# Patient Record
Sex: Female | Born: 1935 | Race: White | Hispanic: No | Smoking: Former smoker
Health system: Southern US, Community
[De-identification: ages and names within clinical notes are randomized; demographics above are authoritative.]

## PROBLEM LIST (undated history)

## (undated) DIAGNOSIS — I639 Cerebral infarction, unspecified: Secondary | ICD-10-CM

## (undated) DIAGNOSIS — I493 Ventricular premature depolarization: Secondary | ICD-10-CM

## (undated) DIAGNOSIS — C50919 Malignant neoplasm of unspecified site of unspecified female breast: Secondary | ICD-10-CM

## (undated) DIAGNOSIS — I5042 Chronic combined systolic (congestive) and diastolic (congestive) heart failure: Secondary | ICD-10-CM

## (undated) DIAGNOSIS — I10 Essential (primary) hypertension: Secondary | ICD-10-CM

## (undated) DIAGNOSIS — K219 Gastro-esophageal reflux disease without esophagitis: Secondary | ICD-10-CM

## (undated) DIAGNOSIS — I472 Ventricular tachycardia: Secondary | ICD-10-CM

## (undated) DIAGNOSIS — N3281 Overactive bladder: Secondary | ICD-10-CM

## (undated) DIAGNOSIS — I2510 Atherosclerotic heart disease of native coronary artery without angina pectoris: Secondary | ICD-10-CM

## (undated) DIAGNOSIS — M1990 Unspecified osteoarthritis, unspecified site: Secondary | ICD-10-CM

## (undated) DIAGNOSIS — E039 Hypothyroidism, unspecified: Secondary | ICD-10-CM

## (undated) DIAGNOSIS — I255 Ischemic cardiomyopathy: Secondary | ICD-10-CM

## (undated) DIAGNOSIS — E785 Hyperlipidemia, unspecified: Secondary | ICD-10-CM

## (undated) DIAGNOSIS — E119 Type 2 diabetes mellitus without complications: Secondary | ICD-10-CM

## (undated) DIAGNOSIS — M8580 Other specified disorders of bone density and structure, unspecified site: Secondary | ICD-10-CM

## (undated) DIAGNOSIS — I420 Dilated cardiomyopathy: Secondary | ICD-10-CM

## (undated) HISTORY — DX: Gastro-esophageal reflux disease without esophagitis: K21.9

## (undated) HISTORY — DX: Type 2 diabetes mellitus without complications: E11.9

## (undated) HISTORY — DX: Hyperlipidemia, unspecified: E78.5

## (undated) HISTORY — DX: Ventricular premature depolarization: I49.3

## (undated) HISTORY — DX: Other specified disorders of bone density and structure, unspecified site: M85.80

## (undated) HISTORY — DX: Essential (primary) hypertension: I10

## (undated) HISTORY — PX: CATARACT EXTRACTION W/ INTRAOCULAR LENS  IMPLANT, BILATERAL: SHX1307

## (undated) HISTORY — DX: Overactive bladder: N32.81

---
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
## 1966-03-12 DIAGNOSIS — I639 Cerebral infarction, unspecified: Secondary | ICD-10-CM

## 1966-03-12 HISTORY — DX: Cerebral infarction, unspecified: I63.9

## 1996-03-12 HISTORY — PX: BREAST LUMPECTOMY WITH AXILLARY LYMPH NODE DISSECTION: SHX5756

## 1996-03-12 HISTORY — PX: BREAST LUMPECTOMY: SHX2

## 1997-06-24 ENCOUNTER — Other Ambulatory Visit: Admission: RE | Admit: 1997-06-24 | Discharge: 1997-06-24 | Payer: Self-pay | Admitting: Hematology and Oncology

## 1997-06-30 ENCOUNTER — Other Ambulatory Visit: Admission: RE | Admit: 1997-06-30 | Discharge: 1997-06-30 | Payer: Self-pay | Admitting: Family Medicine

## 1997-07-01 ENCOUNTER — Other Ambulatory Visit: Admission: RE | Admit: 1997-07-01 | Discharge: 1997-07-01 | Payer: Self-pay | Admitting: Family Medicine

## 1998-07-11 ENCOUNTER — Other Ambulatory Visit: Admission: RE | Admit: 1998-07-11 | Discharge: 1998-07-11 | Payer: Self-pay | Admitting: Family Medicine

## 1999-06-12 ENCOUNTER — Encounter: Admission: RE | Admit: 1999-06-12 | Discharge: 1999-06-12 | Payer: Self-pay | Admitting: Family Medicine

## 1999-06-12 ENCOUNTER — Encounter: Payer: Self-pay | Admitting: Family Medicine

## 1999-07-17 ENCOUNTER — Other Ambulatory Visit: Admission: RE | Admit: 1999-07-17 | Discharge: 1999-07-17 | Payer: Self-pay | Admitting: Family Medicine

## 2000-05-23 ENCOUNTER — Ambulatory Visit (HOSPITAL_COMMUNITY): Admission: RE | Admit: 2000-05-23 | Discharge: 2000-05-23 | Payer: Self-pay | Admitting: Family Medicine

## 2000-05-23 ENCOUNTER — Encounter: Payer: Self-pay | Admitting: Family Medicine

## 2000-08-22 ENCOUNTER — Other Ambulatory Visit: Admission: RE | Admit: 2000-08-22 | Discharge: 2000-08-22 | Payer: Self-pay | Admitting: Family Medicine

## 2001-08-28 ENCOUNTER — Other Ambulatory Visit: Admission: RE | Admit: 2001-08-28 | Discharge: 2001-08-28 | Payer: Self-pay | Admitting: Family Medicine

## 2001-09-02 ENCOUNTER — Ambulatory Visit (HOSPITAL_COMMUNITY): Admission: RE | Admit: 2001-09-02 | Discharge: 2001-09-02 | Payer: Self-pay | Admitting: Oncology

## 2001-09-02 ENCOUNTER — Encounter: Payer: Self-pay | Admitting: Oncology

## 2002-08-31 ENCOUNTER — Other Ambulatory Visit: Admission: RE | Admit: 2002-08-31 | Discharge: 2002-08-31 | Payer: Self-pay | Admitting: Family Medicine

## 2003-03-13 HISTORY — PX: COLONOSCOPY: SHX174

## 2003-05-11 ENCOUNTER — Ambulatory Visit (HOSPITAL_COMMUNITY): Admission: RE | Admit: 2003-05-11 | Discharge: 2003-05-11 | Payer: Self-pay | Admitting: Oncology

## 2004-05-01 ENCOUNTER — Ambulatory Visit: Payer: Self-pay | Admitting: Oncology

## 2004-08-08 ENCOUNTER — Ambulatory Visit: Payer: Self-pay | Admitting: Oncology

## 2004-10-31 ENCOUNTER — Ambulatory Visit: Payer: Self-pay | Admitting: Oncology

## 2005-02-20 ENCOUNTER — Other Ambulatory Visit: Admission: RE | Admit: 2005-02-20 | Discharge: 2005-02-20 | Payer: Self-pay | Admitting: Family Medicine

## 2005-05-09 ENCOUNTER — Ambulatory Visit: Payer: Self-pay | Admitting: Oncology

## 2005-05-10 ENCOUNTER — Ambulatory Visit (HOSPITAL_COMMUNITY): Admission: RE | Admit: 2005-05-10 | Discharge: 2005-05-10 | Payer: Self-pay | Admitting: Oncology

## 2005-10-12 ENCOUNTER — Ambulatory Visit: Payer: Self-pay | Admitting: Oncology

## 2005-10-16 LAB — COMPREHENSIVE METABOLIC PANEL
Albumin: 4.4 g/dL (ref 3.5–5.2)
Alkaline Phosphatase: 92 U/L (ref 39–117)
BUN: 25 mg/dL — ABNORMAL HIGH (ref 6–23)
CO2: 28 mEq/L (ref 19–32)
Chloride: 101 mEq/L (ref 96–112)
Potassium: 4.5 mEq/L (ref 3.5–5.3)
Sodium: 138 mEq/L (ref 135–145)
Total Bilirubin: 0.5 mg/dL (ref 0.3–1.2)
Total Protein: 7 g/dL (ref 6.0–8.3)

## 2005-10-16 LAB — CBC WITH DIFFERENTIAL/PLATELET
EOS%: 3.2 % (ref 0.0–7.0)
Eosinophils Absolute: 0.2 10*3/uL (ref 0.0–0.5)
HGB: 15 g/dL (ref 11.6–15.9)
MCH: 31.6 pg (ref 26.0–34.0)
MCHC: 34.5 g/dL (ref 32.0–36.0)
MCV: 91.7 fL (ref 81.0–101.0)
MONO#: 0.6 10*3/uL (ref 0.1–0.9)
Platelets: 249 10*3/uL (ref 145–400)
RBC: 4.74 10*6/uL (ref 3.70–5.32)
RDW: 12.3 % (ref 11.3–14.5)

## 2006-09-04 ENCOUNTER — Encounter: Admission: RE | Admit: 2006-09-04 | Discharge: 2006-09-04 | Payer: Self-pay | Admitting: Family Medicine

## 2006-10-11 ENCOUNTER — Ambulatory Visit: Payer: Self-pay | Admitting: Oncology

## 2006-10-16 LAB — CBC WITH DIFFERENTIAL/PLATELET
BASO%: 0.4 % (ref 0.0–2.0)
Basophils Absolute: 0 10*3/uL (ref 0.0–0.1)
EOS%: 4.2 % (ref 0.0–7.0)
MCV: 90.6 fL (ref 81.0–101.0)
MONO#: 0.6 10*3/uL (ref 0.1–0.9)
NEUT%: 62.9 % (ref 39.6–76.8)
Platelets: 251 10*3/uL (ref 145–400)
RDW: 12.1 % (ref 11.3–14.5)

## 2006-10-16 LAB — CANCER ANTIGEN 27.29: CA 27.29: 34 U/mL (ref 0–39)

## 2006-10-16 LAB — COMPREHENSIVE METABOLIC PANEL
CO2: 26 mEq/L (ref 19–32)
Creatinine, Ser: 0.76 mg/dL (ref 0.40–1.20)
Potassium: 4.5 mEq/L (ref 3.5–5.3)
Total Protein: 7.2 g/dL (ref 6.0–8.3)

## 2007-10-24 ENCOUNTER — Ambulatory Visit: Payer: Self-pay | Admitting: Oncology

## 2007-10-28 LAB — COMPREHENSIVE METABOLIC PANEL
Albumin: 4.3 g/dL (ref 3.5–5.2)
Alkaline Phosphatase: 67 U/L (ref 39–117)
CO2: 24 mEq/L (ref 19–32)
Creatinine, Ser: 0.78 mg/dL (ref 0.40–1.20)
Sodium: 141 mEq/L (ref 135–145)

## 2007-10-28 LAB — CBC WITH DIFFERENTIAL/PLATELET
Basophils Absolute: 0 10*3/uL (ref 0.0–0.1)
EOS%: 5.2 % (ref 0.0–7.0)
Eosinophils Absolute: 0.4 10*3/uL (ref 0.0–0.5)
HGB: 14.6 g/dL (ref 11.6–15.9)
LYMPH%: 25.1 % (ref 14.0–48.0)
MCHC: 34.4 g/dL (ref 32.0–36.0)
MONO#: 0.6 10*3/uL (ref 0.1–0.9)
MONO%: 7.8 % (ref 0.0–13.0)
RBC: 4.56 10*6/uL (ref 3.70–5.32)
RDW: 12.2 % (ref 11.3–14.5)
lymph#: 1.8 10*3/uL (ref 0.9–3.3)

## 2008-06-16 ENCOUNTER — Encounter: Admission: RE | Admit: 2008-06-16 | Discharge: 2008-08-25 | Payer: Self-pay | Admitting: Internal Medicine

## 2010-03-04 ENCOUNTER — Emergency Department (HOSPITAL_COMMUNITY)
Admission: EM | Admit: 2010-03-04 | Discharge: 2010-03-04 | Payer: Self-pay | Source: Home / Self Care | Admitting: Emergency Medicine

## 2011-03-20 DIAGNOSIS — L570 Actinic keratosis: Secondary | ICD-10-CM | POA: Diagnosis not present

## 2011-04-12 DIAGNOSIS — I6330 Cerebral infarction due to thrombosis of unspecified cerebral artery: Secondary | ICD-10-CM | POA: Diagnosis not present

## 2011-04-12 DIAGNOSIS — M810 Age-related osteoporosis without current pathological fracture: Secondary | ICD-10-CM | POA: Diagnosis not present

## 2011-04-12 DIAGNOSIS — E1129 Type 2 diabetes mellitus with other diabetic kidney complication: Secondary | ICD-10-CM | POA: Diagnosis not present

## 2011-04-12 DIAGNOSIS — Z0000 Encounter for general adult medical examination without abnormal findings: Secondary | ICD-10-CM | POA: Diagnosis not present

## 2011-04-12 DIAGNOSIS — I10 Essential (primary) hypertension: Secondary | ICD-10-CM | POA: Diagnosis not present

## 2011-04-12 DIAGNOSIS — E119 Type 2 diabetes mellitus without complications: Secondary | ICD-10-CM | POA: Diagnosis not present

## 2011-04-18 DIAGNOSIS — E039 Hypothyroidism, unspecified: Secondary | ICD-10-CM | POA: Diagnosis not present

## 2011-04-18 DIAGNOSIS — E1165 Type 2 diabetes mellitus with hyperglycemia: Secondary | ICD-10-CM | POA: Diagnosis not present

## 2011-04-18 DIAGNOSIS — I6330 Cerebral infarction due to thrombosis of unspecified cerebral artery: Secondary | ICD-10-CM | POA: Diagnosis not present

## 2011-04-18 DIAGNOSIS — E785 Hyperlipidemia, unspecified: Secondary | ICD-10-CM | POA: Diagnosis not present

## 2011-04-18 DIAGNOSIS — I10 Essential (primary) hypertension: Secondary | ICD-10-CM | POA: Diagnosis not present

## 2011-04-18 DIAGNOSIS — E1129 Type 2 diabetes mellitus with other diabetic kidney complication: Secondary | ICD-10-CM | POA: Diagnosis not present

## 2011-05-17 DIAGNOSIS — H40059 Ocular hypertension, unspecified eye: Secondary | ICD-10-CM | POA: Diagnosis not present

## 2011-05-17 DIAGNOSIS — H0230 Blepharochalasis unspecified eye, unspecified eyelid: Secondary | ICD-10-CM | POA: Diagnosis not present

## 2011-05-17 DIAGNOSIS — Z961 Presence of intraocular lens: Secondary | ICD-10-CM | POA: Diagnosis not present

## 2011-05-17 DIAGNOSIS — H43819 Vitreous degeneration, unspecified eye: Secondary | ICD-10-CM | POA: Diagnosis not present

## 2011-07-09 DIAGNOSIS — E785 Hyperlipidemia, unspecified: Secondary | ICD-10-CM | POA: Diagnosis not present

## 2011-07-09 DIAGNOSIS — E1129 Type 2 diabetes mellitus with other diabetic kidney complication: Secondary | ICD-10-CM | POA: Diagnosis not present

## 2011-07-16 DIAGNOSIS — E1165 Type 2 diabetes mellitus with hyperglycemia: Secondary | ICD-10-CM | POA: Diagnosis not present

## 2011-07-16 DIAGNOSIS — E785 Hyperlipidemia, unspecified: Secondary | ICD-10-CM | POA: Diagnosis not present

## 2011-07-16 DIAGNOSIS — I10 Essential (primary) hypertension: Secondary | ICD-10-CM | POA: Diagnosis not present

## 2011-07-16 DIAGNOSIS — E1129 Type 2 diabetes mellitus with other diabetic kidney complication: Secondary | ICD-10-CM | POA: Diagnosis not present

## 2011-07-16 DIAGNOSIS — N183 Chronic kidney disease, stage 3 unspecified: Secondary | ICD-10-CM | POA: Diagnosis not present

## 2011-10-17 DIAGNOSIS — I10 Essential (primary) hypertension: Secondary | ICD-10-CM | POA: Diagnosis not present

## 2011-10-17 DIAGNOSIS — E039 Hypothyroidism, unspecified: Secondary | ICD-10-CM | POA: Diagnosis not present

## 2011-10-17 DIAGNOSIS — E785 Hyperlipidemia, unspecified: Secondary | ICD-10-CM | POA: Diagnosis not present

## 2011-10-17 DIAGNOSIS — E1129 Type 2 diabetes mellitus with other diabetic kidney complication: Secondary | ICD-10-CM | POA: Diagnosis not present

## 2011-11-02 DIAGNOSIS — I10 Essential (primary) hypertension: Secondary | ICD-10-CM | POA: Diagnosis not present

## 2011-11-02 DIAGNOSIS — E1129 Type 2 diabetes mellitus with other diabetic kidney complication: Secondary | ICD-10-CM | POA: Diagnosis not present

## 2011-11-02 DIAGNOSIS — E785 Hyperlipidemia, unspecified: Secondary | ICD-10-CM | POA: Diagnosis not present

## 2011-11-02 DIAGNOSIS — R1013 Epigastric pain: Secondary | ICD-10-CM | POA: Diagnosis not present

## 2011-11-06 DIAGNOSIS — L570 Actinic keratosis: Secondary | ICD-10-CM | POA: Diagnosis not present

## 2011-11-14 DIAGNOSIS — I10 Essential (primary) hypertension: Secondary | ICD-10-CM | POA: Diagnosis not present

## 2011-11-14 DIAGNOSIS — E119 Type 2 diabetes mellitus without complications: Secondary | ICD-10-CM | POA: Diagnosis not present

## 2011-11-14 DIAGNOSIS — R1013 Epigastric pain: Secondary | ICD-10-CM | POA: Diagnosis not present

## 2011-12-03 DIAGNOSIS — I10 Essential (primary) hypertension: Secondary | ICD-10-CM | POA: Diagnosis not present

## 2011-12-03 DIAGNOSIS — R002 Palpitations: Secondary | ICD-10-CM | POA: Diagnosis not present

## 2011-12-03 DIAGNOSIS — R079 Chest pain, unspecified: Secondary | ICD-10-CM | POA: Diagnosis not present

## 2011-12-07 DIAGNOSIS — Z23 Encounter for immunization: Secondary | ICD-10-CM | POA: Diagnosis not present

## 2011-12-12 DIAGNOSIS — R1013 Epigastric pain: Secondary | ICD-10-CM | POA: Diagnosis not present

## 2011-12-12 DIAGNOSIS — K219 Gastro-esophageal reflux disease without esophagitis: Secondary | ICD-10-CM | POA: Diagnosis not present

## 2011-12-18 DIAGNOSIS — Z1231 Encounter for screening mammogram for malignant neoplasm of breast: Secondary | ICD-10-CM | POA: Diagnosis not present

## 2012-01-28 DIAGNOSIS — E1129 Type 2 diabetes mellitus with other diabetic kidney complication: Secondary | ICD-10-CM | POA: Diagnosis not present

## 2012-01-28 DIAGNOSIS — E785 Hyperlipidemia, unspecified: Secondary | ICD-10-CM | POA: Diagnosis not present

## 2012-01-28 DIAGNOSIS — I10 Essential (primary) hypertension: Secondary | ICD-10-CM | POA: Diagnosis not present

## 2012-02-12 DIAGNOSIS — N183 Chronic kidney disease, stage 3 unspecified: Secondary | ICD-10-CM | POA: Diagnosis not present

## 2012-02-12 DIAGNOSIS — E785 Hyperlipidemia, unspecified: Secondary | ICD-10-CM | POA: Diagnosis not present

## 2012-02-12 DIAGNOSIS — I10 Essential (primary) hypertension: Secondary | ICD-10-CM | POA: Diagnosis not present

## 2012-02-12 DIAGNOSIS — E1165 Type 2 diabetes mellitus with hyperglycemia: Secondary | ICD-10-CM | POA: Diagnosis not present

## 2012-02-12 DIAGNOSIS — E1129 Type 2 diabetes mellitus with other diabetic kidney complication: Secondary | ICD-10-CM | POA: Diagnosis not present

## 2012-05-20 DIAGNOSIS — H0230 Blepharochalasis unspecified eye, unspecified eyelid: Secondary | ICD-10-CM | POA: Diagnosis not present

## 2012-05-20 DIAGNOSIS — Z961 Presence of intraocular lens: Secondary | ICD-10-CM | POA: Diagnosis not present

## 2012-05-20 DIAGNOSIS — H40059 Ocular hypertension, unspecified eye: Secondary | ICD-10-CM | POA: Diagnosis not present

## 2012-05-20 DIAGNOSIS — H40019 Open angle with borderline findings, low risk, unspecified eye: Secondary | ICD-10-CM | POA: Diagnosis not present

## 2012-05-22 DIAGNOSIS — M1710 Unilateral primary osteoarthritis, unspecified knee: Secondary | ICD-10-CM | POA: Diagnosis not present

## 2012-05-22 DIAGNOSIS — IMO0002 Reserved for concepts with insufficient information to code with codable children: Secondary | ICD-10-CM | POA: Diagnosis not present

## 2012-08-13 DIAGNOSIS — Z23 Encounter for immunization: Secondary | ICD-10-CM | POA: Diagnosis not present

## 2012-08-13 DIAGNOSIS — Z1331 Encounter for screening for depression: Secondary | ICD-10-CM | POA: Diagnosis not present

## 2012-08-13 DIAGNOSIS — Z0000 Encounter for general adult medical examination without abnormal findings: Secondary | ICD-10-CM | POA: Diagnosis not present

## 2012-08-13 DIAGNOSIS — M899 Disorder of bone, unspecified: Secondary | ICD-10-CM | POA: Diagnosis not present

## 2012-08-13 DIAGNOSIS — M949 Disorder of cartilage, unspecified: Secondary | ICD-10-CM | POA: Diagnosis not present

## 2012-08-14 DIAGNOSIS — E785 Hyperlipidemia, unspecified: Secondary | ICD-10-CM | POA: Diagnosis not present

## 2012-08-14 DIAGNOSIS — E1129 Type 2 diabetes mellitus with other diabetic kidney complication: Secondary | ICD-10-CM | POA: Diagnosis not present

## 2012-08-14 DIAGNOSIS — E039 Hypothyroidism, unspecified: Secondary | ICD-10-CM | POA: Diagnosis not present

## 2012-08-14 DIAGNOSIS — E1165 Type 2 diabetes mellitus with hyperglycemia: Secondary | ICD-10-CM | POA: Diagnosis not present

## 2012-08-14 DIAGNOSIS — I10 Essential (primary) hypertension: Secondary | ICD-10-CM | POA: Diagnosis not present

## 2012-08-20 DIAGNOSIS — E785 Hyperlipidemia, unspecified: Secondary | ICD-10-CM | POA: Diagnosis not present

## 2012-08-20 DIAGNOSIS — I10 Essential (primary) hypertension: Secondary | ICD-10-CM | POA: Diagnosis not present

## 2012-08-20 DIAGNOSIS — E1165 Type 2 diabetes mellitus with hyperglycemia: Secondary | ICD-10-CM | POA: Diagnosis not present

## 2012-08-20 DIAGNOSIS — E1129 Type 2 diabetes mellitus with other diabetic kidney complication: Secondary | ICD-10-CM | POA: Diagnosis not present

## 2012-08-20 DIAGNOSIS — N183 Chronic kidney disease, stage 3 unspecified: Secondary | ICD-10-CM | POA: Diagnosis not present

## 2012-10-09 ENCOUNTER — Ambulatory Visit: Payer: Medicare Other | Attending: Internal Medicine

## 2012-10-09 DIAGNOSIS — IMO0001 Reserved for inherently not codable concepts without codable children: Secondary | ICD-10-CM | POA: Diagnosis not present

## 2012-10-09 DIAGNOSIS — M25559 Pain in unspecified hip: Secondary | ICD-10-CM | POA: Insufficient documentation

## 2012-10-09 DIAGNOSIS — R262 Difficulty in walking, not elsewhere classified: Secondary | ICD-10-CM | POA: Diagnosis not present

## 2012-10-13 ENCOUNTER — Ambulatory Visit: Payer: Medicare Other | Attending: Internal Medicine | Admitting: Physical Therapy

## 2012-10-13 DIAGNOSIS — R262 Difficulty in walking, not elsewhere classified: Secondary | ICD-10-CM | POA: Diagnosis not present

## 2012-10-13 DIAGNOSIS — M25559 Pain in unspecified hip: Secondary | ICD-10-CM | POA: Diagnosis not present

## 2012-10-13 DIAGNOSIS — IMO0001 Reserved for inherently not codable concepts without codable children: Secondary | ICD-10-CM | POA: Diagnosis not present

## 2012-10-15 ENCOUNTER — Ambulatory Visit: Payer: Medicare Other | Admitting: Physical Therapy

## 2012-10-21 ENCOUNTER — Ambulatory Visit: Payer: Medicare Other | Admitting: Physical Therapy

## 2012-10-23 ENCOUNTER — Ambulatory Visit: Payer: Medicare Other | Admitting: Physical Therapy

## 2012-10-28 ENCOUNTER — Encounter: Payer: Medicare Other | Admitting: Physical Therapy

## 2012-10-29 ENCOUNTER — Ambulatory Visit: Payer: Medicare Other | Admitting: Physical Therapy

## 2012-11-03 ENCOUNTER — Ambulatory Visit: Payer: Medicare Other | Admitting: Physical Therapy

## 2012-11-05 ENCOUNTER — Ambulatory Visit: Payer: Medicare Other | Admitting: Physical Therapy

## 2012-11-11 ENCOUNTER — Ambulatory Visit: Payer: Medicare Other | Attending: Internal Medicine | Admitting: Physical Therapy

## 2012-11-11 DIAGNOSIS — IMO0001 Reserved for inherently not codable concepts without codable children: Secondary | ICD-10-CM | POA: Insufficient documentation

## 2012-11-11 DIAGNOSIS — M25559 Pain in unspecified hip: Secondary | ICD-10-CM | POA: Diagnosis not present

## 2012-11-11 DIAGNOSIS — R262 Difficulty in walking, not elsewhere classified: Secondary | ICD-10-CM | POA: Insufficient documentation

## 2012-11-13 ENCOUNTER — Ambulatory Visit: Payer: Medicare Other | Admitting: Physical Therapy

## 2012-11-18 ENCOUNTER — Ambulatory Visit: Payer: Medicare Other | Admitting: Physical Therapy

## 2012-11-20 ENCOUNTER — Ambulatory Visit: Payer: Medicare Other | Admitting: Physical Therapy

## 2012-11-26 ENCOUNTER — Ambulatory Visit: Payer: Medicare Other | Admitting: Physical Therapy

## 2012-11-28 DIAGNOSIS — Z23 Encounter for immunization: Secondary | ICD-10-CM | POA: Diagnosis not present

## 2012-12-18 DIAGNOSIS — Z1231 Encounter for screening mammogram for malignant neoplasm of breast: Secondary | ICD-10-CM | POA: Diagnosis not present

## 2013-01-08 DIAGNOSIS — L821 Other seborrheic keratosis: Secondary | ICD-10-CM | POA: Diagnosis not present

## 2013-01-08 DIAGNOSIS — L820 Inflamed seborrheic keratosis: Secondary | ICD-10-CM | POA: Diagnosis not present

## 2013-01-08 DIAGNOSIS — D485 Neoplasm of uncertain behavior of skin: Secondary | ICD-10-CM | POA: Diagnosis not present

## 2013-01-08 DIAGNOSIS — L570 Actinic keratosis: Secondary | ICD-10-CM | POA: Diagnosis not present

## 2013-02-12 DIAGNOSIS — E1129 Type 2 diabetes mellitus with other diabetic kidney complication: Secondary | ICD-10-CM | POA: Diagnosis not present

## 2013-02-12 DIAGNOSIS — I10 Essential (primary) hypertension: Secondary | ICD-10-CM | POA: Diagnosis not present

## 2013-02-12 DIAGNOSIS — E039 Hypothyroidism, unspecified: Secondary | ICD-10-CM | POA: Diagnosis not present

## 2013-02-26 DIAGNOSIS — E785 Hyperlipidemia, unspecified: Secondary | ICD-10-CM | POA: Diagnosis not present

## 2013-02-26 DIAGNOSIS — N183 Chronic kidney disease, stage 3 unspecified: Secondary | ICD-10-CM | POA: Diagnosis not present

## 2013-02-26 DIAGNOSIS — E1129 Type 2 diabetes mellitus with other diabetic kidney complication: Secondary | ICD-10-CM | POA: Diagnosis not present

## 2013-02-26 DIAGNOSIS — I10 Essential (primary) hypertension: Secondary | ICD-10-CM | POA: Diagnosis not present

## 2013-03-19 DIAGNOSIS — L821 Other seborrheic keratosis: Secondary | ICD-10-CM | POA: Diagnosis not present

## 2013-03-19 DIAGNOSIS — L570 Actinic keratosis: Secondary | ICD-10-CM | POA: Diagnosis not present

## 2013-03-19 DIAGNOSIS — L905 Scar conditions and fibrosis of skin: Secondary | ICD-10-CM | POA: Diagnosis not present

## 2013-03-19 DIAGNOSIS — D2330 Other benign neoplasm of skin of unspecified part of face: Secondary | ICD-10-CM | POA: Diagnosis not present

## 2013-03-30 DIAGNOSIS — M1710 Unilateral primary osteoarthritis, unspecified knee: Secondary | ICD-10-CM | POA: Diagnosis not present

## 2013-03-30 DIAGNOSIS — IMO0002 Reserved for concepts with insufficient information to code with codable children: Secondary | ICD-10-CM | POA: Diagnosis not present

## 2013-03-30 DIAGNOSIS — I10 Essential (primary) hypertension: Secondary | ICD-10-CM | POA: Diagnosis not present

## 2013-08-10 DIAGNOSIS — IMO0002 Reserved for concepts with insufficient information to code with codable children: Secondary | ICD-10-CM | POA: Diagnosis not present

## 2013-08-10 DIAGNOSIS — M1710 Unilateral primary osteoarthritis, unspecified knee: Secondary | ICD-10-CM | POA: Diagnosis not present

## 2013-08-10 DIAGNOSIS — M25569 Pain in unspecified knee: Secondary | ICD-10-CM | POA: Diagnosis not present

## 2013-08-26 DIAGNOSIS — E1165 Type 2 diabetes mellitus with hyperglycemia: Secondary | ICD-10-CM | POA: Diagnosis not present

## 2013-08-26 DIAGNOSIS — E039 Hypothyroidism, unspecified: Secondary | ICD-10-CM | POA: Diagnosis not present

## 2013-08-26 DIAGNOSIS — M899 Disorder of bone, unspecified: Secondary | ICD-10-CM | POA: Diagnosis not present

## 2013-08-26 DIAGNOSIS — E1129 Type 2 diabetes mellitus with other diabetic kidney complication: Secondary | ICD-10-CM | POA: Diagnosis not present

## 2013-08-26 DIAGNOSIS — Z23 Encounter for immunization: Secondary | ICD-10-CM | POA: Diagnosis not present

## 2013-08-26 DIAGNOSIS — I10 Essential (primary) hypertension: Secondary | ICD-10-CM | POA: Diagnosis not present

## 2013-08-26 DIAGNOSIS — Z0000 Encounter for general adult medical examination without abnormal findings: Secondary | ICD-10-CM | POA: Diagnosis not present

## 2013-08-26 DIAGNOSIS — Z1331 Encounter for screening for depression: Secondary | ICD-10-CM | POA: Diagnosis not present

## 2013-08-31 DIAGNOSIS — I6330 Cerebral infarction due to thrombosis of unspecified cerebral artery: Secondary | ICD-10-CM | POA: Diagnosis not present

## 2013-08-31 DIAGNOSIS — I10 Essential (primary) hypertension: Secondary | ICD-10-CM | POA: Diagnosis not present

## 2013-08-31 DIAGNOSIS — E1129 Type 2 diabetes mellitus with other diabetic kidney complication: Secondary | ICD-10-CM | POA: Diagnosis not present

## 2013-08-31 DIAGNOSIS — N183 Chronic kidney disease, stage 3 unspecified: Secondary | ICD-10-CM | POA: Diagnosis not present

## 2013-08-31 DIAGNOSIS — E785 Hyperlipidemia, unspecified: Secondary | ICD-10-CM | POA: Diagnosis not present

## 2013-09-02 ENCOUNTER — Other Ambulatory Visit (HOSPITAL_COMMUNITY): Payer: Self-pay | Admitting: Internal Medicine

## 2013-09-02 DIAGNOSIS — R011 Cardiac murmur, unspecified: Secondary | ICD-10-CM

## 2013-09-09 DIAGNOSIS — I5042 Chronic combined systolic (congestive) and diastolic (congestive) heart failure: Secondary | ICD-10-CM

## 2013-09-09 DIAGNOSIS — I4729 Other ventricular tachycardia: Secondary | ICD-10-CM

## 2013-09-09 DIAGNOSIS — I472 Ventricular tachycardia: Secondary | ICD-10-CM | POA: Diagnosis present

## 2013-09-09 DIAGNOSIS — I255 Ischemic cardiomyopathy: Secondary | ICD-10-CM

## 2013-09-09 DIAGNOSIS — I420 Dilated cardiomyopathy: Secondary | ICD-10-CM

## 2013-09-09 HISTORY — DX: Chronic combined systolic (congestive) and diastolic (congestive) heart failure: I50.42

## 2013-09-09 HISTORY — DX: Ventricular tachycardia: I47.2

## 2013-09-09 HISTORY — DX: Dilated cardiomyopathy: I42.0

## 2013-09-09 HISTORY — DX: Other ventricular tachycardia: I47.29

## 2013-09-09 HISTORY — DX: Ischemic cardiomyopathy: I25.5

## 2013-09-15 ENCOUNTER — Ambulatory Visit (HOSPITAL_COMMUNITY)
Admission: RE | Admit: 2013-09-15 | Discharge: 2013-09-15 | Disposition: A | Discharge status: Home / Self Care | Payer: Medicare Other | Source: Ambulatory Visit | Attending: Internal Medicine | Admitting: Internal Medicine

## 2013-09-15 DIAGNOSIS — R011 Cardiac murmur, unspecified: Secondary | ICD-10-CM | POA: Insufficient documentation

## 2013-09-15 DIAGNOSIS — I059 Rheumatic mitral valve disease, unspecified: Secondary | ICD-10-CM | POA: Diagnosis not present

## 2013-09-15 NOTE — Progress Notes (Signed)
2D Echo Performed 09/15/2013    Marygrace Drought, RCS Talked to Dr. Debara Pickett about echo before pt. leaving

## 2013-09-21 DIAGNOSIS — K219 Gastro-esophageal reflux disease without esophagitis: Secondary | ICD-10-CM | POA: Diagnosis not present

## 2013-09-21 DIAGNOSIS — I059 Rheumatic mitral valve disease, unspecified: Secondary | ICD-10-CM | POA: Diagnosis not present

## 2013-09-21 DIAGNOSIS — I501 Left ventricular failure: Secondary | ICD-10-CM | POA: Diagnosis not present

## 2013-09-28 ENCOUNTER — Telehealth: Payer: Self-pay | Admitting: Cardiovascular Disease

## 2013-09-29 NOTE — Telephone Encounter (Signed)
Closed encounter °

## 2013-10-05 ENCOUNTER — Encounter: Payer: Self-pay | Admitting: Cardiovascular Disease

## 2013-10-05 ENCOUNTER — Ambulatory Visit (INDEPENDENT_AMBULATORY_CARE_PROVIDER_SITE_OTHER): Payer: Medicare Other | Admitting: Cardiovascular Disease

## 2013-10-05 VITALS — BP 112/60 | HR 98 | Resp 20 | Ht 66.5 in | Wt 154.0 lb

## 2013-10-05 DIAGNOSIS — E7800 Pure hypercholesterolemia, unspecified: Secondary | ICD-10-CM | POA: Insufficient documentation

## 2013-10-05 DIAGNOSIS — Z01810 Encounter for preprocedural cardiovascular examination: Secondary | ICD-10-CM | POA: Diagnosis not present

## 2013-10-05 DIAGNOSIS — I5022 Chronic systolic (congestive) heart failure: Secondary | ICD-10-CM

## 2013-10-05 DIAGNOSIS — I209 Angina pectoris, unspecified: Secondary | ICD-10-CM | POA: Diagnosis not present

## 2013-10-05 DIAGNOSIS — E785 Hyperlipidemia, unspecified: Secondary | ICD-10-CM

## 2013-10-05 DIAGNOSIS — I509 Heart failure, unspecified: Secondary | ICD-10-CM | POA: Diagnosis not present

## 2013-10-05 DIAGNOSIS — E119 Type 2 diabetes mellitus without complications: Secondary | ICD-10-CM

## 2013-10-05 DIAGNOSIS — R079 Chest pain, unspecified: Secondary | ICD-10-CM | POA: Diagnosis not present

## 2013-10-05 DIAGNOSIS — I10 Essential (primary) hypertension: Secondary | ICD-10-CM

## 2013-10-05 LAB — CBC
HEMATOCRIT: 41.6 % (ref 36.0–46.0)
HEMOGLOBIN: 14.4 g/dL (ref 12.0–15.0)
MCH: 31.8 pg (ref 26.0–34.0)
MCHC: 34.6 g/dL (ref 30.0–36.0)
MCV: 91.8 fL (ref 78.0–100.0)
PLATELETS: 325 10*3/uL (ref 150–400)
RBC: 4.53 MIL/uL (ref 3.87–5.11)
RDW: 13 % (ref 11.5–15.5)
WBC: 9.3 10*3/uL (ref 4.0–10.5)

## 2013-10-05 LAB — BASIC METABOLIC PANEL WITH GFR
BUN: 24 mg/dL — AB (ref 6–23)
CO2: 24 meq/L (ref 19–32)
CREATININE: 0.96 mg/dL (ref 0.50–1.10)
Calcium: 10.3 mg/dL (ref 8.4–10.5)
Chloride: 95 mEq/L — ABNORMAL LOW (ref 96–112)
GFR, EST AFRICAN AMERICAN: 62 mL/min
GFR, Est Non African American: 54 mL/min — ABNORMAL LOW
GLUCOSE: 131 mg/dL — AB (ref 70–99)
Potassium: 4.7 mEq/L (ref 3.5–5.3)
Sodium: 134 mEq/L — ABNORMAL LOW (ref 135–145)

## 2013-10-05 LAB — PROTIME-INR
INR: 0.98 (ref <1.50)
Prothrombin Time: 13 seconds (ref 11.6–15.2)

## 2013-10-05 NOTE — Patient Instructions (Signed)
Your physician has requested that you have a cardiac catheterization. Cardiac catheterization is used to diagnose and/or treat various heart conditions. Doctors may recommend this procedure for a number of different reasons. The most common reason is to evaluate chest pain. Chest pain can be a symptom of coronary artery disease (CAD), and cardiac catheterization can show whether plaque is narrowing or blocking your heart's arteries. This procedure is also used to evaluate the valves, as well as measure the blood flow and oxygen levels in different parts of your heart. For further information please visit [email or web address]. Please follow instruction sheet, as given.   RIGHT AND LEFT HEART CATH WITH DR Montel Culver TOMORROW IF POSSIBLE  Your physician recommends that you HAVE LAB WORK TODAY

## 2013-10-05 NOTE — Progress Notes (Signed)
Patient ID: Marissa Jefferson, female   DOB: April 29, 1924, 80 y.o.   MRN: [id number]      Reason for office visit Newly diagnosed congestive heart failure  [name] is a well-preserved and highly functional 80 year old patient of Dr. Noah Delaine, referred in consultation for heart failure  She presented with complaints of worsening exertional dyspnea, was recently performed echocardiogram shows severe left ventricular systolic dysfunction (ejection fraction 20-25%) without evidence of regional wall motion abnormalities. The left ventricle is moderately dilated as is the left atrium. At that time the echo was performed (July 7) the Doppler parameters suggested that the patient was well compensated, without elevated filling pressures.  Her complaints began roughly 4 months ago. She lives in an upstairs apartment and has to climb 17 steps to reach it. This has become more difficult, although she can do it without stopping. Recently she has noticed that she becomes dyspneic even walking across the rooms in her apartment. She also has more fatigue and has to take frequent naps. In parallel she has some epigastric discomfort ("heartburn") that has mixed features. Sometimes it is relieved with Alka-Seltzer. It sometimes happens when she is at rest but does appear to increase in frequency when she climbs the stairs sometimes even walking to the bathroom. He generally lingers for about 5 minutes before resolving. She does not have dyspnea at rest, orthopnea or any edema.  [name] has treated hyperlipidemia, hypertension and mild type 2 diabetes mellitus. She has no known history of arrhythmia or structural heart disease. She thinks she had a treadmill  stress test roughly 2 years ago that was "okay". I cannot locate that record.   No Known Allergies  Current Outpatient Prescriptions  Medication Sig Dispense Refill  . acetaminophen (TYLENOL) 325 MG tablet Take 650 mg by mouth every 6 (six) hours as needed.       Marland Kitchen aspirin 81 MG tablet Take 81 mg by mouth daily.      Marland Kitchen atorvastatin (LIPITOR) 80 MG tablet Take 1 tablet by mouth daily.      . cholecalciferol (VITAMIN D) 1000 UNITS tablet Take 1,000 Units by mouth daily.      Marland Kitchen levothyroxine (SYNTHROID, LEVOTHROID) 112 MCG tablet Take 1 tablet by mouth daily.      Marland Kitchen lisinopril-hydrochlorothiazide (PRINZIDE,ZESTORETIC) 20-12.5 MG per tablet Take 2 tablets by mouth daily.      . metFORMIN (GLUCOPHAGE) 1000 MG tablet Take 1 tablet by mouth 2 (two) times daily.      Marland Kitchen oxybutynin (DITROPAN XL) 15 MG 24 hr tablet Take 2 tablets by mouth daily.      . pantoprazole (PROTONIX) 40 MG tablet Take 1 tablet by mouth daily.      . VOLTAREN 1 % GEL Apply 1 application topically daily.       No current facility-administered medications for this visit.    Past Medical History  Diagnosis Date  . CVA (cerebral infarction)     81, leaving no deficit  . DM2 (diabetes mellitus, type 2)   . Hypertension   . Hyperlipidemia   . GERD (gastroesophageal reflux disease)   . OAB (overactive bladder)   . HX: breast cancer   . Osteopenia     Past Surgical History  Procedure Laterality Date  . Breast lumpectomy  1998  . Colonoscopy  2005    No family history on file.  History   Social History  . Marital Status: Widowed    Spouse Name: [name]    Number of  Children: [name]  . Years of Education: [name]   Occupational History  . Not on file.   Social History Main Topics  . Smoking status: Never Smoker   . Smokeless tobacco: Not on file  . Alcohol Use: No  . Drug Use: No  . Sexual Activity: Not on file   Other Topics Concern  . Not on file   Social History Narrative  . No narrative on file    Review of systems: The patient specifically denies  orthopnea, paroxysmal nocturnal dyspnea, syncope, palpitations, focal neurological deficits, intermittent claudication, lower extremity edema, unexplained weight gain, cough, hemoptysis or wheezing.  The patient also  denies  nausea, vomiting, dysphagia, diarrhea, constipation, polyuria, polydipsia, dysuria, hematuria, frequency, urgency, abnormal bleeding or bruising, fever, chills, unexpected weight changes, mood swings, change in skin or hair texture, change in voice quality, auditory or visual problems, allergic reactions or rashes, new musculoskeletal complaints other than usual "aches and pains".   PHYSICAL EXAM BP 112/60  Pulse 98  Resp 20  Ht 5' 6.5" (1.689 m)  Wt 154 lb (69.854 kg)  BMI 24.49 kg/m2  General: Alert, oriented x3, no distress Head: no evidence of trauma, PERRL, EOMI, no exophtalmos or lid lag, no myxedema, no xanthelasma; normal ears, nose and oropharynx Neck: normal jugular venous pulsations and no hepatojugular reflux; brisk carotid pulses without delay and no carotid bruits Chest: clear to auscultation, no signs of consolidation by percussion or palpation, normal fremitus, symmetrical and full respiratory excursions Cardiovascular: Lateral displacement of the apical impulse, regular rhythm, normal first and second heart sounds, no murmurs, rubs or gallops Abdomen: no tenderness or distention, no masses by palpation, no abnormal pulsatility or arterial bruits, normal bowel sounds, no hepatosplenomegaly Extremities: no clubbing, cyanosis or edema; 2+ radial, ulnar and brachial pulses bilaterally; 2+ right femoral, posterior tibial and dorsalis pedis pulses; 2+ left femoral, posterior tibial and dorsalis pedis pulses; no subclavian or femoral bruits Neurological: grossly nonfocal   EKG: Borderline sinus tachycardia, a single PVC, left atrial abnormality, left ventricular hypertrophy with downsloping ST segments and biphasic T waves in leads V5 V6  Lipid Panel  No results found for this basename: chol, trig, hdl, cholhdl, vldl, ldlcalc    BMET    Component Value Date/Time   NA 141 10/28/2007 0919   K 4.4 10/28/2007 0919   CL 102 10/28/2007 0919   CO2 24 10/28/2007 0919   GLUCOSE  129* 10/28/2007 0919   BUN 32* 10/28/2007 0919   CREATININE 0.78 10/28/2007 0919   CALCIUM 9.3 10/28/2007 0919     ASSESSMENT AND PLAN  The patient appears to have new onset systolic heart failure, NYHA functional class 2-3, without overt signs of hypervolemia by physical exam. Fortuitously, she is already on a full dose of ACE inhibitor for treatment of her hypertension and is also taking a weak diuretic. This probably explains why her symptoms have been relatively well-controlled. At this time the only indication of decompensation of congestive heart failure is borderline resting tachycardia Statistically, the most likely cause for her cardiomyopathy will be ischemic heart disease/CAD. She has numerous coronary risk factors and advanced age as well as atypical epigastric discomfort that probably represents angina. I have recommended that her initial evaluation should be a right and left heart catheterization with coronary angiography. After this is performed we should probably discontinue the thiazide diuretic in favor of a more potent loop diuretic and will gradually institute beta blocker therapy with carvedilol, slowly titrated up to the highest  dose tolerated. Despite her advanced age, she appears to be in excellent physical and mental shape and I think shewould be a reasonable candidate for coronary artery bypass surgery, should she have multivessel CAD.  The procedure has been fully reviewed with the patient and written informed consent has been obtained.   Orders Placed This Encounter  Procedures  . BMP with Estimated GFR (CPT-80048)  . CBC  . INR/PT  . EKG 12-Lead  . LEFT AND RIGHT HEART CATHETERIZATION WITH CORONARY ANGIOGRAM   Meds ordered this encounter  Medications  . pantoprazole (PROTONIX) 40 MG tablet    Sig: Take 1 tablet by mouth daily.  Marland Kitchen lisinopril-hydrochlorothiazide (PRINZIDE,ZESTORETIC) 20-12.5 MG per tablet    Sig: Take 2 tablets by mouth daily.  Marland Kitchen atorvastatin  (LIPITOR) 80 MG tablet    Sig: Take 1 tablet by mouth daily.  Marland Kitchen levothyroxine (SYNTHROID, LEVOTHROID) 112 MCG tablet    Sig: Take 1 tablet by mouth daily.  . metFORMIN (GLUCOPHAGE) 1000 MG tablet    Sig: Take 1 tablet by mouth 2 (two) times daily.  . VOLTAREN 1 % GEL    Sig: Apply 1 application topically daily.  Marland Kitchen oxybutynin (DITROPAN XL) 15 MG 24 hr tablet    Sig: Take 2 tablets by mouth daily.  . cholecalciferol (VITAMIN D) 1000 UNITS tablet    Sig: Take 1,000 Units by mouth daily.  Marland Kitchen acetaminophen (TYLENOL) 325 MG tablet    Sig: Take 650 mg by mouth every 6 (six) hours as needed.  Marland Kitchen aspirin 81 MG tablet    Sig: Take 81 mg by mouth daily.    Holli Humbles, MD, [place] [phone] office [phone] pager

## 2013-10-06 ENCOUNTER — Inpatient Hospital Stay (HOSPITAL_COMMUNITY)
Admission: RE | Admit: 2013-10-06 | Discharge: 2013-10-21 | DRG: 234 | Disposition: A | Discharge status: Skilled Nursing Facility | Payer: Medicare Other | Source: Ambulatory Visit | Attending: Thoracic Surgery (Cardiothoracic Vascular Surgery) | Admitting: Thoracic Surgery (Cardiothoracic Vascular Surgery)

## 2013-10-06 ENCOUNTER — Encounter (HOSPITAL_COMMUNITY)
Admission: RE | Disposition: A | Discharge status: Skilled Nursing Facility | Payer: Self-pay | Source: Ambulatory Visit | Attending: Thoracic Surgery (Cardiothoracic Vascular Surgery)

## 2013-10-06 DIAGNOSIS — Z8673 Personal history of transient ischemic attack (TIA), and cerebral infarction without residual deficits: Secondary | ICD-10-CM | POA: Diagnosis not present

## 2013-10-06 DIAGNOSIS — I5043 Acute on chronic combined systolic (congestive) and diastolic (congestive) heart failure: Principal | ICD-10-CM | POA: Diagnosis present

## 2013-10-06 DIAGNOSIS — R269 Unspecified abnormalities of gait and mobility: Secondary | ICD-10-CM | POA: Diagnosis not present

## 2013-10-06 DIAGNOSIS — E871 Hypo-osmolality and hyponatremia: Secondary | ICD-10-CM | POA: Diagnosis not present

## 2013-10-06 DIAGNOSIS — I498 Other specified cardiac arrhythmias: Secondary | ICD-10-CM | POA: Diagnosis not present

## 2013-10-06 DIAGNOSIS — Y832 Surgical operation with anastomosis, bypass or graft as the cause of abnormal reaction of the patient, or of later complication, without mention of misadventure at the time of the procedure: Secondary | ICD-10-CM | POA: Diagnosis present

## 2013-10-06 DIAGNOSIS — I2589 Other forms of chronic ischemic heart disease: Secondary | ICD-10-CM | POA: Diagnosis present

## 2013-10-06 DIAGNOSIS — K219 Gastro-esophageal reflux disease without esophagitis: Secondary | ICD-10-CM | POA: Diagnosis present

## 2013-10-06 DIAGNOSIS — R197 Diarrhea, unspecified: Secondary | ICD-10-CM | POA: Diagnosis not present

## 2013-10-06 DIAGNOSIS — Z452 Encounter for adjustment and management of vascular access device: Secondary | ICD-10-CM | POA: Diagnosis not present

## 2013-10-06 DIAGNOSIS — I509 Heart failure, unspecified: Secondary | ICD-10-CM | POA: Diagnosis not present

## 2013-10-06 DIAGNOSIS — Z7982 Long term (current) use of aspirin: Secondary | ICD-10-CM

## 2013-10-06 DIAGNOSIS — I4891 Unspecified atrial fibrillation: Secondary | ICD-10-CM | POA: Diagnosis not present

## 2013-10-06 DIAGNOSIS — I4729 Other ventricular tachycardia: Secondary | ICD-10-CM | POA: Diagnosis not present

## 2013-10-06 DIAGNOSIS — I25119 Atherosclerotic heart disease of native coronary artery with unspecified angina pectoris: Secondary | ICD-10-CM

## 2013-10-06 DIAGNOSIS — I5020 Unspecified systolic (congestive) heart failure: Secondary | ICD-10-CM | POA: Diagnosis not present

## 2013-10-06 DIAGNOSIS — R0609 Other forms of dyspnea: Secondary | ICD-10-CM | POA: Diagnosis not present

## 2013-10-06 DIAGNOSIS — I5022 Chronic systolic (congestive) heart failure: Secondary | ICD-10-CM

## 2013-10-06 DIAGNOSIS — N318 Other neuromuscular dysfunction of bladder: Secondary | ICD-10-CM | POA: Diagnosis present

## 2013-10-06 DIAGNOSIS — I428 Other cardiomyopathies: Secondary | ICD-10-CM | POA: Diagnosis not present

## 2013-10-06 DIAGNOSIS — I420 Dilated cardiomyopathy: Secondary | ICD-10-CM

## 2013-10-06 DIAGNOSIS — R4181 Age-related cognitive decline: Secondary | ICD-10-CM | POA: Diagnosis present

## 2013-10-06 DIAGNOSIS — I10 Essential (primary) hypertension: Secondary | ICD-10-CM | POA: Diagnosis present

## 2013-10-06 DIAGNOSIS — Y9210 Unspecified residential institution as the place of occurrence of the external cause: Secondary | ICD-10-CM | POA: Diagnosis present

## 2013-10-06 DIAGNOSIS — I2510 Atherosclerotic heart disease of native coronary artery without angina pectoris: Secondary | ICD-10-CM

## 2013-10-06 DIAGNOSIS — R350 Frequency of micturition: Secondary | ICD-10-CM | POA: Diagnosis not present

## 2013-10-06 DIAGNOSIS — Z87891 Personal history of nicotine dependence: Secondary | ICD-10-CM

## 2013-10-06 DIAGNOSIS — Z48812 Encounter for surgical aftercare following surgery on the circulatory system: Secondary | ICD-10-CM | POA: Diagnosis not present

## 2013-10-06 DIAGNOSIS — Z01810 Encounter for preprocedural cardiovascular examination: Secondary | ICD-10-CM | POA: Diagnosis not present

## 2013-10-06 DIAGNOSIS — I472 Ventricular tachycardia, unspecified: Secondary | ICD-10-CM | POA: Diagnosis not present

## 2013-10-06 DIAGNOSIS — J90 Pleural effusion, not elsewhere classified: Secondary | ICD-10-CM | POA: Diagnosis not present

## 2013-10-06 DIAGNOSIS — Z923 Personal history of irradiation: Secondary | ICD-10-CM

## 2013-10-06 DIAGNOSIS — I209 Angina pectoris, unspecified: Secondary | ICD-10-CM

## 2013-10-06 DIAGNOSIS — I519 Heart disease, unspecified: Secondary | ICD-10-CM | POA: Diagnosis present

## 2013-10-06 DIAGNOSIS — J9819 Other pulmonary collapse: Secondary | ICD-10-CM | POA: Diagnosis not present

## 2013-10-06 DIAGNOSIS — I255 Ischemic cardiomyopathy: Secondary | ICD-10-CM

## 2013-10-06 DIAGNOSIS — E119 Type 2 diabetes mellitus without complications: Secondary | ICD-10-CM | POA: Diagnosis present

## 2013-10-06 DIAGNOSIS — R918 Other nonspecific abnormal finding of lung field: Secondary | ICD-10-CM | POA: Diagnosis not present

## 2013-10-06 DIAGNOSIS — E039 Hypothyroidism, unspecified: Secondary | ICD-10-CM | POA: Diagnosis not present

## 2013-10-06 DIAGNOSIS — Z951 Presence of aortocoronary bypass graft: Secondary | ICD-10-CM

## 2013-10-06 DIAGNOSIS — Z853 Personal history of malignant neoplasm of breast: Secondary | ICD-10-CM

## 2013-10-06 DIAGNOSIS — Z79899 Other long term (current) drug therapy: Secondary | ICD-10-CM | POA: Diagnosis not present

## 2013-10-06 DIAGNOSIS — D62 Acute posthemorrhagic anemia: Secondary | ICD-10-CM | POA: Diagnosis not present

## 2013-10-06 DIAGNOSIS — D649 Anemia, unspecified: Secondary | ICD-10-CM | POA: Diagnosis not present

## 2013-10-06 DIAGNOSIS — J449 Chronic obstructive pulmonary disease, unspecified: Secondary | ICD-10-CM | POA: Diagnosis not present

## 2013-10-06 DIAGNOSIS — M6281 Muscle weakness (generalized): Secondary | ICD-10-CM | POA: Diagnosis not present

## 2013-10-06 DIAGNOSIS — I2582 Chronic total occlusion of coronary artery: Secondary | ICD-10-CM | POA: Diagnosis present

## 2013-10-06 DIAGNOSIS — R079 Chest pain, unspecified: Secondary | ICD-10-CM

## 2013-10-06 DIAGNOSIS — E785 Hyperlipidemia, unspecified: Secondary | ICD-10-CM | POA: Diagnosis present

## 2013-10-06 DIAGNOSIS — J984 Other disorders of lung: Secondary | ICD-10-CM | POA: Diagnosis not present

## 2013-10-06 DIAGNOSIS — I7090 Unspecified atherosclerosis: Secondary | ICD-10-CM | POA: Diagnosis not present

## 2013-10-06 DIAGNOSIS — Z01811 Encounter for preprocedural respiratory examination: Secondary | ICD-10-CM | POA: Diagnosis not present

## 2013-10-06 DIAGNOSIS — R279 Unspecified lack of coordination: Secondary | ICD-10-CM | POA: Diagnosis not present

## 2013-10-06 HISTORY — DX: Ischemic cardiomyopathy: I25.5

## 2013-10-06 HISTORY — DX: Chronic combined systolic (congestive) and diastolic (congestive) heart failure: I50.42

## 2013-10-06 HISTORY — DX: Atherosclerotic heart disease of native coronary artery without angina pectoris: I25.10

## 2013-10-06 HISTORY — DX: Unspecified osteoarthritis, unspecified site: M19.90

## 2013-10-06 HISTORY — DX: Hypothyroidism, unspecified: E03.9

## 2013-10-06 HISTORY — PX: CARDIAC CATHETERIZATION: SHX172

## 2013-10-06 HISTORY — DX: Ventricular tachycardia: I47.2

## 2013-10-06 HISTORY — DX: Dilated cardiomyopathy: I42.0

## 2013-10-06 HISTORY — PX: LEFT AND RIGHT HEART CATHETERIZATION WITH CORONARY ANGIOGRAM: SHX5449

## 2013-10-06 HISTORY — DX: Malignant neoplasm of unspecified site of unspecified female breast: C50.919

## 2013-10-06 HISTORY — DX: Cerebral infarction, unspecified: I63.9

## 2013-10-06 LAB — POCT I-STAT 3, ART BLOOD GAS (G3+)
ACID-BASE EXCESS: 2 mmol/L (ref 0.0–2.0)
Bicarbonate: 27.4 mEq/L — ABNORMAL HIGH (ref 20.0–24.0)
O2 Saturation: 95 %
PCO2 ART: 43.1 mmHg (ref 35.0–45.0)
PH ART: 7.412 (ref 7.350–7.450)
PO2 ART: 73 mmHg — AB (ref 80.0–100.0)
TCO2: 29 mmol/L (ref 0–100)

## 2013-10-06 LAB — GLUCOSE, CAPILLARY
GLUCOSE-CAPILLARY: 173 mg/dL — AB (ref 70–99)
Glucose-Capillary: 127 mg/dL — ABNORMAL HIGH (ref 70–99)

## 2013-10-06 LAB — POCT I-STAT 3, VENOUS BLOOD GAS (G3P V)
Acid-base deficit: 1 mmol/L (ref 0.0–2.0)
BICARBONATE: 25 meq/L — AB (ref 20.0–24.0)
O2 SAT: 70 %
PCO2 VEN: 46.4 mmHg (ref 45.0–50.0)
PH VEN: 7.34 — AB (ref 7.250–7.300)
PO2 VEN: 39 mmHg (ref 30.0–45.0)
TCO2: 26 mmol/L (ref 0–100)

## 2013-10-06 SURGERY — LEFT AND RIGHT HEART CATHETERIZATION WITH CORONARY ANGIOGRAM
Anesthesia: LOCAL

## 2013-10-06 MED ORDER — FUROSEMIDE 40 MG PO TABS
40.0000 mg | ORAL_TABLET | Freq: Every day | ORAL | Status: DC
  Administered 2013-10-06 – 2013-10-08 (×3): 40 mg via ORAL
  Filled 2013-10-06 (×4): qty 1

## 2013-10-06 MED ORDER — OXYBUTYNIN CHLORIDE ER 15 MG PO TB24
30.0000 mg | ORAL_TABLET | Freq: Every day | ORAL | Status: DC
  Administered 2013-10-06 – 2013-10-10 (×4): 30 mg via ORAL
  Filled 2013-10-06 (×7): qty 2

## 2013-10-06 MED ORDER — FENTANYL CITRATE 0.05 MG/ML IJ SOLN
INTRAMUSCULAR | Status: AC
  Filled 2013-10-06: qty 2

## 2013-10-06 MED ORDER — POTASSIUM CHLORIDE CRYS ER 20 MEQ PO TBCR
20.0000 meq | EXTENDED_RELEASE_TABLET | Freq: Every day | ORAL | Status: DC
  Administered 2013-10-06 – 2013-10-08 (×3): 20 meq via ORAL
  Filled 2013-10-06 (×4): qty 1

## 2013-10-06 MED ORDER — LIDOCAINE HCL (PF) 1 % IJ SOLN
INTRAMUSCULAR | Status: AC
  Filled 2013-10-06: qty 30

## 2013-10-06 MED ORDER — HEPARIN SODIUM (PORCINE) 1000 UNIT/ML IJ SOLN
INTRAMUSCULAR | Status: AC
  Filled 2013-10-06: qty 1

## 2013-10-06 MED ORDER — HEPARIN SODIUM (PORCINE) 5000 UNIT/ML IJ SOLN
5000.0000 [IU] | Freq: Three times a day (TID) | INTRAMUSCULAR | Status: DC
Start: 2013-10-07 — End: 2013-10-09
  Administered 2013-10-07 – 2013-10-08 (×5): 5000 [IU] via SUBCUTANEOUS
  Filled 2013-10-06 (×9): qty 1

## 2013-10-06 MED ORDER — LISINOPRIL 40 MG PO TABS
40.0000 mg | ORAL_TABLET | Freq: Every day | ORAL | Status: DC
  Administered 2013-10-07 – 2013-10-08 (×2): 40 mg via ORAL
  Filled 2013-10-06 (×3): qty 1

## 2013-10-06 MED ORDER — VERAPAMIL HCL 2.5 MG/ML IV SOLN
INTRAVENOUS | Status: AC
  Filled 2013-10-06: qty 2

## 2013-10-06 MED ORDER — OXYBUTYNIN CHLORIDE ER 15 MG PO TB24: 30.0000 mg | ORAL_TABLET | Freq: Every day | ORAL | Status: DC

## 2013-10-06 MED ORDER — PANTOPRAZOLE SODIUM 40 MG PO TBEC
40.0000 mg | DELAYED_RELEASE_TABLET | Freq: Every day | ORAL | Status: DC
  Administered 2013-10-07 – 2013-10-08 (×2): 40 mg via ORAL
  Filled 2013-10-06 (×2): qty 1

## 2013-10-06 MED ORDER — ASPIRIN 81 MG PO CHEW
81.0000 mg | CHEWABLE_TABLET | Freq: Every day | ORAL | Status: DC
  Administered 2013-10-06 – 2013-10-08 (×3): 81 mg via ORAL
  Filled 2013-10-06 (×3): qty 1

## 2013-10-06 MED ORDER — ACETAMINOPHEN 325 MG PO TABS
650.0000 mg | ORAL_TABLET | Freq: Four times a day (QID) | ORAL | Status: DC | PRN
  Administered 2013-10-06 – 2013-10-08 (×2): 650 mg via ORAL
  Filled 2013-10-06 (×2): qty 2

## 2013-10-06 MED ORDER — NITROGLYCERIN 1 MG/10 ML FOR IR/CATH LAB
INTRA_ARTERIAL | Status: AC
  Filled 2013-10-06: qty 10

## 2013-10-06 MED ORDER — SODIUM CHLORIDE 0.9 % IJ SOLN: 3.0000 mL | Freq: Two times a day (BID) | INTRAMUSCULAR | Status: DC

## 2013-10-06 MED ORDER — SODIUM CHLORIDE 0.9 % IJ SOLN
3.0000 mL | INTRAMUSCULAR | Status: DC | PRN
Start: 2013-10-06 — End: 2013-10-06

## 2013-10-06 MED ORDER — ATORVASTATIN CALCIUM 80 MG PO TABS
80.0000 mg | ORAL_TABLET | Freq: Every day | ORAL | Status: DC
  Administered 2013-10-06 – 2013-10-21 (×15): 80 mg via ORAL
  Filled 2013-10-06 (×16): qty 1

## 2013-10-06 MED ORDER — SODIUM CHLORIDE 0.9 % IV SOLN
INTRAVENOUS | Status: DC
  Administered 2013-10-06: 15:00:00 via INTRAVENOUS

## 2013-10-06 MED ORDER — LEVOTHYROXINE SODIUM 112 MCG PO TABS
112.0000 ug | ORAL_TABLET | Freq: Every day | ORAL | Status: DC
  Administered 2013-10-07 – 2013-10-21 (×14): 112 ug via ORAL
  Filled 2013-10-06 (×18): qty 1

## 2013-10-06 MED ORDER — SODIUM CHLORIDE 0.9 % IV SOLN: 250.0000 mL | INTRAVENOUS | Status: DC | PRN

## 2013-10-06 MED ORDER — HEPARIN (PORCINE) IN NACL 2-0.9 UNIT/ML-% IJ SOLN
INTRAMUSCULAR | Status: AC
  Filled 2013-10-06: qty 1000

## 2013-10-06 MED ORDER — MIDAZOLAM HCL 2 MG/2ML IJ SOLN
INTRAMUSCULAR | Status: AC
  Filled 2013-10-06: qty 2

## 2013-10-06 NOTE — H&P (View-Only) (Signed)
Patient ID: Marissa Jefferson, female   DOB: 03/31/1924, 80 y.o.   MRN: [id number]      Reason for office visit Newly diagnosed congestive heart failure  Marissa Jefferson is a well-preserved and highly functional 80 year old patient of Dr. Noah Delaine, referred in consultation for heart failure  She presented with complaints of worsening exertional dyspnea, was recently performed echocardiogram shows severe left ventricular systolic dysfunction (ejection fraction 20-25%) without evidence of regional wall motion abnormalities. The left ventricle is moderately dilated as is the left atrium. At that time the echo was performed (July 7) the Doppler parameters suggested that the patient was well compensated, without elevated filling pressures.  Her complaints began roughly 4 months ago. She lives in an upstairs apartment and has to climb 17 steps to reach it. This has become more difficult, although she can do it without stopping. Recently she has noticed that she becomes dyspneic even walking across the rooms in her apartment. She also has more fatigue and has to take frequent naps. In parallel she has some epigastric discomfort ("heartburn") that has mixed features. Sometimes it is relieved with Alka-Seltzer. It sometimes happens when she is at rest but does appear to increase in frequency when she climbs the stairs sometimes even walking to the bathroom. He generally lingers for about 5 minutes before resolving. She does not have dyspnea at rest, orthopnea or any edema.  Marissa Jefferson has treated hyperlipidemia, hypertension and mild type 2 diabetes mellitus. She has no known history of arrhythmia or structural heart disease. She thinks she had a treadmill  stress test roughly 2 years ago that was "okay". I cannot locate that record.   No Known Allergies  Current Outpatient Prescriptions  Medication Sig Dispense Refill  . acetaminophen (TYLENOL) 325 MG tablet Take 650 mg by mouth every 6 (six) hours as needed.       Marland Kitchen aspirin 81 MG tablet Take 81 mg by mouth daily.      Marland Kitchen atorvastatin (LIPITOR) 80 MG tablet Take 1 tablet by mouth daily.      . cholecalciferol (VITAMIN D) 1000 UNITS tablet Take 1,000 Units by mouth daily.      Marland Kitchen levothyroxine (SYNTHROID, LEVOTHROID) 112 MCG tablet Take 1 tablet by mouth daily.      Marland Kitchen lisinopril-hydrochlorothiazide (PRINZIDE,ZESTORETIC) 20-12.5 MG per tablet Take 2 tablets by mouth daily.      . metFORMIN (GLUCOPHAGE) 1000 MG tablet Take 1 tablet by mouth 2 (two) times daily.      Marland Kitchen oxybutynin (DITROPAN XL) 15 MG 24 hr tablet Take 2 tablets by mouth daily.      . pantoprazole (PROTONIX) 40 MG tablet Take 1 tablet by mouth daily.      . VOLTAREN 1 % GEL Apply 1 application topically daily.       No current facility-administered medications for this visit.    Past Medical History  Diagnosis Date  . CVA (cerebral infarction)     58, leaving no deficit  . DM2 (diabetes mellitus, type 2)   . Hypertension   . Hyperlipidemia   . GERD (gastroesophageal reflux disease)   . OAB (overactive bladder)   . HX: breast cancer   . Osteopenia     Past Surgical History  Procedure Laterality Date  . Breast lumpectomy  1998  . Colonoscopy  2005    No family history on file.  History   Social History  . Marital Status: Widowed    Spouse Name: [name]    Number of  Children: [name]  . Years of Education: [name]   Occupational History  . Not on file.   Social History Main Topics  . Smoking status: Never Smoker   . Smokeless tobacco: Not on file  . Alcohol Use: No  . Drug Use: No  . Sexual Activity: Not on file   Other Topics Concern  . Not on file   Social History Narrative  . No narrative on file    Review of systems: The patient specifically denies  orthopnea, paroxysmal nocturnal dyspnea, syncope, palpitations, focal neurological deficits, intermittent claudication, lower extremity edema, unexplained weight gain, cough, hemoptysis or wheezing.  The patient also  denies  nausea, vomiting, dysphagia, diarrhea, constipation, polyuria, polydipsia, dysuria, hematuria, frequency, urgency, abnormal bleeding or bruising, fever, chills, unexpected weight changes, mood swings, change in skin or hair texture, change in voice quality, auditory or visual problems, allergic reactions or rashes, new musculoskeletal complaints other than usual "aches and pains".   PHYSICAL EXAM BP 112/60  Pulse 98  Resp 20  Ht 5' 6.5" (1.689 m)  Wt 154 lb (69.854 kg)  BMI 24.49 kg/m2  General: Alert, oriented x3, no distress Head: no evidence of trauma, PERRL, EOMI, no exophtalmos or lid lag, no myxedema, no xanthelasma; normal ears, nose and oropharynx Neck: normal jugular venous pulsations and no hepatojugular reflux; brisk carotid pulses without delay and no carotid bruits Chest: clear to auscultation, no signs of consolidation by percussion or palpation, normal fremitus, symmetrical and full respiratory excursions Cardiovascular: Lateral displacement of the apical impulse, regular rhythm, normal first and second heart sounds, no murmurs, rubs or gallops Abdomen: no tenderness or distention, no masses by palpation, no abnormal pulsatility or arterial bruits, normal bowel sounds, no hepatosplenomegaly Extremities: no clubbing, cyanosis or edema; 2+ radial, ulnar and brachial pulses bilaterally; 2+ right femoral, posterior tibial and dorsalis pedis pulses; 2+ left femoral, posterior tibial and dorsalis pedis pulses; no subclavian or femoral bruits Neurological: grossly nonfocal   EKG: Borderline sinus tachycardia, a single PVC, left atrial abnormality, left ventricular hypertrophy with downsloping ST segments and biphasic T waves in leads V5 V6  Lipid Panel  No results found for this basename: chol, trig, hdl, cholhdl, vldl, ldlcalc    BMET    Component Value Date/Time   NA 141 10/28/2007 0919   K 4.4 10/28/2007 0919   CL 102 10/28/2007 0919   CO2 24 10/28/2007 0919   GLUCOSE  129* 10/28/2007 0919   BUN 32* 10/28/2007 0919   CREATININE 0.78 10/28/2007 0919   CALCIUM 9.3 10/28/2007 0919     ASSESSMENT AND PLAN  The patient appears to have new onset systolic heart failure, NYHA functional class 2-3, without overt signs of hypervolemia by physical exam. Fortuitously, she is already on a full dose of ACE inhibitor for treatment of her hypertension and is also taking a weak diuretic. This probably explains why her symptoms have been relatively well-controlled. At this time the only indication of decompensation of congestive heart failure is borderline resting tachycardia Statistically, the most likely cause for her cardiomyopathy will be ischemic heart disease/CAD. She has numerous coronary risk factors and advanced age as well as atypical epigastric discomfort that probably represents angina. I have recommended that her initial evaluation should be a right and left heart catheterization with coronary angiography. After this is performed we should probably discontinue the thiazide diuretic in favor of a more potent loop diuretic and will gradually institute beta blocker therapy with carvedilol, slowly titrated up to the highest  dose tolerated. Despite her advanced age, she appears to be in excellent physical and mental shape and I think shewould be a reasonable candidate for coronary artery bypass surgery, should she have multivessel CAD.  The procedure has been fully reviewed with the patient and written informed consent has been obtained.   Orders Placed This Encounter  Procedures  . BMP with Estimated GFR (CPT-80048)  . CBC  . INR/PT  . EKG 12-Lead  . LEFT AND RIGHT HEART CATHETERIZATION WITH CORONARY ANGIOGRAM   Meds ordered this encounter  Medications  . pantoprazole (PROTONIX) 40 MG tablet    Sig: Take 1 tablet by mouth daily.  Marland Kitchen lisinopril-hydrochlorothiazide (PRINZIDE,ZESTORETIC) 20-12.5 MG per tablet    Sig: Take 2 tablets by mouth daily.  Marland Kitchen atorvastatin  (LIPITOR) 80 MG tablet    Sig: Take 1 tablet by mouth daily.  Marland Kitchen levothyroxine (SYNTHROID, LEVOTHROID) 112 MCG tablet    Sig: Take 1 tablet by mouth daily.  . metFORMIN (GLUCOPHAGE) 1000 MG tablet    Sig: Take 1 tablet by mouth 2 (two) times daily.  . VOLTAREN 1 % GEL    Sig: Apply 1 application topically daily.  Marland Kitchen oxybutynin (DITROPAN XL) 15 MG 24 hr tablet    Sig: Take 2 tablets by mouth daily.  . cholecalciferol (VITAMIN D) 1000 UNITS tablet    Sig: Take 1,000 Units by mouth daily.  Marland Kitchen acetaminophen (TYLENOL) 325 MG tablet    Sig: Take 650 mg by mouth every 6 (six) hours as needed.  Marland Kitchen aspirin 81 MG tablet    Sig: Take 81 mg by mouth daily.    Holli Humbles, MD, [place] [phone] office [phone] pager

## 2013-10-06 NOTE — Progress Notes (Signed)
Cath Lab Visit (complete for each Cath Lab visit)  Clinical Evaluation Leading to the Procedure:   ACS: No.  Non-ACS:    Anginal Classification: CCS III  Anti-ischemic medical therapy: No Therapy  Non-Invasive Test Results: No non-invasive testing performed  Prior CABG: No previous CABG       

## 2013-10-06 NOTE — Interval H&P Note (Signed)
History and Physical Interval Note:  10/06/2013 1:40 PM  Marissa Jefferson  has presented today for surgery, with the diagnosis of hf  The various methods of treatment have been discussed with the patient and family. After consideration of risks, benefits and other options for treatment, the patient has consented to  Procedure(s): LEFT AND RIGHT HEART CATHETERIZATION WITH CORONARY ANGIOGRAM ([name]) as a surgical intervention .  The patient's history has been reviewed, patient examined, no change in status, stable for surgery.  I have reviewed the patient's chart and labs.  Questions were answered to the patient's satisfaction.     [name]

## 2013-10-06 NOTE — Consult Note (Addendum)
[address]       [place] [address]             [phone]        Maryann Rasnick Sanders Medical Record #[id number] Date of Birth: January 21, 1925  Referring: No ref. provider found Primary Care: Thressa Sheller, MD  Patient examined, left and right heart catheterization and 2-D echocardiogram reviewed  Chief Complaint:   No chief complaint on file.  dyspnea on exertion, decreased exercise tolerance, heartburn  History of Present Illness:     80 year old Caucasian female diabetic nonsmoker with history of left breast cancer treated with radiation-lumpectomy 15 years ago underwent outpatient right and left heart catheterization for symptoms of class III CHF. No prior history of cardiac catheterization. No family history of CAD. She has hypertension and diabetes. No symptoms of PND ankle edema or orthopnea. She has had sinus tachycardia.  The echocardiogram demonstrated severe LV dysfunction EF 20-25% with LV diameter 6.5 cm and diffuse hypokinesia. There was mild to moderate MR, the aortic valve sclerotic but not stenotic.  Outpatient left and right heart catheterization performed today shows severe three-vessel CAD with chronic RCA occlusion with collateralization from the LAD and circumflex. The mid LAD has a significant stenosis. OM1 and OM 2 vessels have moderate to severe disease. EF is 20%  LVEDP is 19. PA pressures are normal but wedge is elevated at 18-20, mixed venous saturation 70%  Current Activity/ Function  Patient lives alone and is independent. Her daughter is a Software engineer. She had previous left-side probable hypertensive stroke with resolvedleft hand weakness over time   Zubrod Score: At the time of surgery this patient's most appropriate activity status/level should be described as: []     0    Normal activity, no symptoms []     1    Restricted in physical strenuous activity but ambulatory, able to do out light work []     2    Ambulatory and  capable of self care, unable to do work activities, up and about                 more than 50%  Of the time                            []     3    Only limited self care, in bed greater than 50% of waking hours []     4    Completely disabled, no self care, confined to bed or chair []     5    Moribund  Past Medical History  Diagnosis Date  . CVA (cerebral infarction)     7, leaving no deficit  . DM2 (diabetes mellitus, type 2)   . Hypertension   . Hyperlipidemia   . GERD (gastroesophageal reflux disease)   . OAB (overactive bladder)   . HX: breast cancer   . Osteopenia     Past Surgical History  Procedure Laterality Date  . Breast lumpectomy  1998  . Colonoscopy  2005    History  Smoking status  . Never Smoker   Smokeless tobacco  . Not on file    History  Alcohol Use No    History   Social History  . Marital Status: Widowed    Spouse Name: [name]    Number of Children: [name]  . Years of Education: [name]   Occupational History  . Not on file.  Social History Main Topics  . Smoking status: Never Smoker   . Smokeless tobacco: Not on file  . Alcohol Use: No  . Drug Use: No  . Sexual Activity: Not on file   Other Topics Concern  . Not on file   Social History Narrative  . No narrative on file    No Known Allergies  Current Facility-Administered Medications  Medication Dose Route Frequency Provider Last Rate Last Dose  . [START ON 10/07/2013] 0.9 %  sodium chloride infusion   Intravenous Continuous Mihai Croitoru, MD 75 mL/hr at 10/06/13 1437    . 0.9 %  sodium chloride infusion  250 mL Intravenous PRN Mihai Croitoru, MD      . sodium chloride 0.9 % injection 3 mL  3 mL Intravenous Q12H Mihai Croitoru, MD      . sodium chloride 0.9 % injection 3 mL  3 mL Intravenous PRN Mihai Croitoru, MD        Prescriptions prior to admission  Medication Sig Dispense Refill  . acetaminophen (TYLENOL) 325 MG tablet Take 650 mg by mouth every 6 (six) hours as needed.      Marland Kitchen  aspirin 81 MG tablet Take 81 mg by mouth daily.      Marland Kitchen atorvastatin (LIPITOR) 80 MG tablet Take 1 tablet by mouth daily.      . cholecalciferol (VITAMIN D) 1000 UNITS tablet Take 1,000 Units by mouth daily.      Marland Kitchen levothyroxine (SYNTHROID, LEVOTHROID) 112 MCG tablet Take 1 tablet by mouth daily.      Marland Kitchen lisinopril-hydrochlorothiazide (PRINZIDE,ZESTORETIC) 20-12.5 MG per tablet Take 2 tablets by mouth daily.      . metFORMIN (GLUCOPHAGE) 1000 MG tablet Take 1 tablet by mouth 2 (two) times daily.      Marland Kitchen oxybutynin (DITROPAN XL) 15 MG 24 hr tablet Take 2 tablets by mouth daily.      . pantoprazole (PROTONIX) 40 MG tablet Take 1 tablet by mouth daily.      . VOLTAREN 1 % GEL Apply 1 application topically daily.        No family history on file.   Review of Systems:  She is right-hand dominant She had a left breast lumpectomy and left axillary dissection 1998 She denies previous thoracic surgery or thoracic trauma She denies DVT or varicose veins-she denies claudication She denies diabetic neuropathy or neuropathic ulcers    Cardiac Review of Systems: Y or N  Chest Pain [ no   ]  Resting SOB [no   ] Exertional SOB  [place].Blacker  ]  Orthopnea [ no ]   Pedal Edema [ no  ]    Palpitations [no  ] Syncope  [ no ]   Presyncope [no   ]  General Review of Systems: [Y] = yes [  ]=no Constitional: recent weight change [no  ]; anorexia [  ]; fatigue [  ]; nausea [place].Blacker  ]; night sweats [  ]; fever [  ]; or chills [  ]                                                               Dental: poor dentition[  ]; Last Dentist visit: Every 6 months  Eye : blurred vision [  ]; diplopia [   ];  vision changes [  ];  Amaurosis fugax[  ]; Resp: cough [  ];  wheezing[  ];  hemoptysis[  ]; shortness of breath[ yes ]; paroxysmal nocturnal dyspnea[  ]; dyspnea on exertion[ yes ]; or orthopnea[  ];  GI:  gallstones[  ], vomiting[  ];  dysphagia[  ]; melena[  ];  hematochezia [  ]; heartburn[  ];   Hx of  Colonoscopy[  ]; GU:  kidney stones [  ]; hematuria[  ];   dysuria [  ];  nocturia[  ];  history of     obstruction [  ]; urinary frequency [  ]             Skin: rash, swelling[  ];, hair loss[  ];  peripheral edema[  ];  or itching[  ]; Musculosketetal: myalgias[  ];  joint swelling[  ];  joint erythema[  ];  joint pain[  ];  back pain[  ];  Heme/Lymph: bruising[  ];  bleeding[  ];  anemia[  ];  Neuro: TIA[  ];  headaches[  ];  stroke[  ];  vertigo[  ];  seizures[  ];   paresthesias[  ];  difficulty walking[  ];  Psych:depression[  ]; anxiety[  ];  Endocrine: diabetes[ yes ];  thyroid dysfunction[  ];  Immunizations: Flu [  ]; Pneumococcal[  ];  Other:  Physical Exam: BP 145/88  Pulse 93  Temp(Src) 98.9 F (37.2 C) (Oral)  Resp 20  Ht 5' 6.5" (1.689 m)  Wt 154 lb (69.854 kg)  BMI 24.49 kg/m2  SpO2 94%  Exam Gen.-elderly Caucasian female in cath lab holding area with groin compression HEENT-normocephalic pupils equal dentition good Neck-no JVD mass or bruit Thorax-no tenderness or deformity breath sounds clear [id number]/ 6 systolic murmur, tachycardic, no gallop Abdomen-nontender no pulsatile mass Extremities-no venous insufficiency no edema Vascular-nonpalpable pedal pulses Neuro-no focal weakness    Diagnostic Studies & Laboratory data:     Recent Radiology Findings:   No results found.    Recent Lab Findings: Lab Results  Component Value Date   WBC 9.3 10/05/2013   HGB 14.4 10/05/2013   HCT 41.6 10/05/2013   PLT 325 10/05/2013   GLUCOSE 131* 10/05/2013   ALT 17 10/28/2007   AST 16 10/28/2007   NA 134* 10/05/2013   K 4.7 10/05/2013   CL 95* 10/05/2013   CREATININE 0.96 10/05/2013   BUN 24* 10/05/2013   CO2 24 10/05/2013   INR 0.98 10/05/2013      Assessment / Plan:      80 year old diabetic female with ischemic cardiomyopathy. She has well compensated  clinical heart failure by [place] data She has chronic occlusion of the RCA and her recent progression in symptoms is probably due to  increase severity of LAD disease. On fluoroscopy her thoracic aorta appears to be moderate- heavily calcified-chest CT without contrast pending Because of advanced age, severe LV dysfunction, and probable aortic calcification she would be at high risk for CABG. The patient would  best be treated with PCI of the left-sided vessels which will probably   Improve her exertional symptoms. Patient in fact is Very reluctant to undergo surgery.    we will proceed with completion of preoperative testing and follow up with patient and her cardiology team       @ME1 @ 10/06/2013 5:36 PM

## 2013-10-06 NOTE — Op Note (Signed)
CARDIAC CATHETERIZATION REPORT   Procedures performed:  1. Right and left heart catheterization  2. Selective coronary angiography  3. Left ventriculography   Reason for procedure:   Congestive heart failure Unexplained atypical chest pain   Procedure performed by: Sanda Klein, MD, [facility]  Complications: none   Estimated blood loss: less than 5 mL   History:  Mrs. Klett is a well-preserved and highly functional 80 year old patient of Dr. Noah Delaine, referred in consultation for heart failure  She presented with complaints of worsening exertional dyspnea, was recently performed echocardiogram shows severe left ventricular systolic dysfunction (ejection fraction 20-25%) without evidence of regional wall motion abnormalities. The left ventricle is moderately dilated as is the left atrium. At that time the echo was performed (July 7) the Doppler parameters suggested that the patient was well compensated, without elevated filling pressures. She also describes "heartburn"that is often exertion related.   Consent: The risks, benefits, and details of the procedure were explained to the patient. Risks including death, MI, stroke, bleeding, limb ischemia, renal failure and allergy were described and accepted by the patient. Informed written consent was obtained prior to proceeding.  Technique: The patient was brought to the cardiac catheterization laboratory in the fasting state. He was prepped and draped in the usual sterile fashion. Local anesthesia with 1% lidocaine was administered to the right wrist area, but radial artery access was not successful. The right groin area was prepped and anesthetized. Using the modified Seldinger technique a 5 French right common femoral artery sheath and 46F femoral vein sheath were introduced without difficulty. Under fluoroscopic guidance, right heart cath was performed with a 56F PA catheter. Subsequently, using 5 [place] JL4, JR and angled pigtail catheters,  selective cannulation of the left coronary artery, right coronary artery and left ventricle were respectively performed. Several coronary angiograms in a variety of projections were recorded, as well as a left ventriculogram in the RAO projection. Left ventricular pressure and a pull back to the aorta were recorded. No immediate complications occurred. At the end of the procedure, all catheters were removed. After the procedure, hemostasis will be achieved with manual pressure.  Contrast used: 100 mL Omnipaque  Hemodynamic findings:  Aortic pressure 124/70 (mean 92) mm Hg   Left ventricle 976/73 with end-diastolic pressure of 19 mm Hg  PA wedge pressure a wave 19, v wave 18 (mean 18) mm Hg  Pulmonary artery 38/22 (mean 30) mm Hg  Right ventricle 39/0 with an end-diastolic pressure of 3 mm Hg  Right atrium a wave 4, v wave 2 (mean 0) mm Hg  Cardiac output is 5.4 L per minute (cardiac index 2.7 L per minute per meter sq) Ao sat95% PA sat 70%   Angiographic Findings:  All the coronary arteries are moderate to severely calcified 1. The left main coronary artery is extremely short. The LAD and circumflex have essentially separate ostia.  2. The left anterior descending artery is a large vessel that reaches the apex and generates three diagonal branches. There is evidence of extensiveirregularities and severe calcification. There is a 70% ostial stenosis, a 60-70% proximal stenosis, a 90% mid stenosis and a 80% distal stenosis 3. The left circumflex coronary artery is a medium-size vessel non dominant vessel that generates two major oblique marginal arteries. There is evidence of extensive luminal irregularities and moderate calcification.There is a 90% stenosis in the first OM artery. There is a 50% stenosis in the left circumflex leading to the second OM artery. 4. The right coronary artery  is a very large-size dominant vessel that generates an extensive posterior lateral ventricular system  as well as the PDA. The right coronary is occluded immediately following the ostium. The entire vessel can be seen filling retrogradely almost to the ostium via left to right collaterals, especially from the circumflex system 5. The left ventricle is mildly dilated. The left ventricle systolic function is severely decreased with an estimated ejection fraction of 20 %. Regional wall motion abnormalities are not clearly seen. There is global hypokinesis. Specifically, the inferior wall appears no worse than the anterior wall. No left ventricular thrombus is seen. There is mild mitral insufficiency. The ascending aorta appears normal. There is no aortic valve stenosis by pullback. The left ventricular end-diastolic pressure is elevated.    IMPRESSIONS:   Severe multivessel CAD with total occlusion of the dominant RCA and severe stenoses in the LAD artery and OM branch. Severe ischemic cardiomyopathy.   RECOMMENDATION:  Preferred therapy will be CABG. The disincentives to this recommendation is the patient's age and history of chest radiation therapy, possibly aortic calcification. Will need to evaluate the carotid arteries and ascending aorta. The patient lives independently in a second floor apartment. She will need temporary inpatient rehab after surgery. Partial revascularization of the LAD and OM is possible, but the RCA would be very challenging (CTO with ostial occlusion). Extensive coronary calcification would also make PCI difficult.    Sanda Klein, MD, [facility] [phone] office [phone] pager

## 2013-10-07 ENCOUNTER — Inpatient Hospital Stay (HOSPITAL_COMMUNITY): Payer: Medicare Other

## 2013-10-07 ENCOUNTER — Other Ambulatory Visit: Payer: Self-pay | Admitting: *Deleted

## 2013-10-07 ENCOUNTER — Encounter (HOSPITAL_COMMUNITY): Payer: Self-pay | Admitting: Physician Assistant

## 2013-10-07 DIAGNOSIS — I2510 Atherosclerotic heart disease of native coronary artery without angina pectoris: Secondary | ICD-10-CM

## 2013-10-07 DIAGNOSIS — Z01810 Encounter for preprocedural cardiovascular examination: Secondary | ICD-10-CM

## 2013-10-07 LAB — BLOOD GAS, ARTERIAL
Acid-Base Excess: 2.4 mmol/L — ABNORMAL HIGH (ref 0.0–2.0)
Bicarbonate: 25.9 mEq/L — ABNORMAL HIGH (ref 20.0–24.0)
DRAWN BY: 35849
FIO2: 0.21 %
O2 Saturation: 94.8 %
PCO2 ART: 36.3 mmHg (ref 35.0–45.0)
PO2 ART: 74.6 mmHg — AB (ref 80.0–100.0)
Patient temperature: 98.6
TCO2: 27 mmol/L (ref 0–100)
pH, Arterial: 7.468 — ABNORMAL HIGH (ref 7.350–7.450)

## 2013-10-07 LAB — PULMONARY FUNCTION TEST
FEF 25-75 Pre: 1.75 L/sec
FEF2575-%Pred-Pre: 147 %
FEV1-%Pred-Pre: 73 %
FEV1-Pre: 1.42 L
FEV1FVC-%Pred-Pre: 125 %
FEV6-%Pred-Pre: 64 %
FEV6-Pre: 1.58 L
FEV6FVC-%Pred-Pre: 106 %
FVC-%Pred-Pre: 60 %
FVC-Pre: 1.58 L
Pre FEV1/FVC ratio: 90 %
Pre FEV6/FVC Ratio: 100 %

## 2013-10-07 LAB — LIPID PANEL
Cholesterol: 148 mg/dL (ref 0–200)
HDL: 40 mg/dL (ref >39)
LDL CALC: 86 mg/dL (ref 0–99)
TRIGLYCERIDES: 111 mg/dL (ref <150)
Total CHOL/HDL Ratio: 3.7 RATIO
VLDL: 22 mg/dL (ref 0–40)

## 2013-10-07 LAB — COMPREHENSIVE METABOLIC PANEL
ALBUMIN: 3.9 g/dL (ref 3.5–5.2)
ALT: 10 U/L (ref 0–35)
AST: 15 U/L (ref 0–37)
Alkaline Phosphatase: 50 U/L (ref 39–117)
Anion gap: 20 — ABNORMAL HIGH (ref 5–15)
BILIRUBIN TOTAL: 0.4 mg/dL (ref 0.3–1.2)
BUN: 28 mg/dL — ABNORMAL HIGH (ref 6–23)
CHLORIDE: 93 meq/L — AB (ref 96–112)
CO2: 24 meq/L (ref 19–32)
CREATININE: 0.93 mg/dL (ref 0.50–1.10)
Calcium: 9.9 mg/dL (ref 8.4–10.5)
GFR calc Af Amer: 63 mL/min — ABNORMAL LOW (ref >90)
GFR calc non Af Amer: 54 mL/min — ABNORMAL LOW (ref >90)
Glucose, Bld: 196 mg/dL — ABNORMAL HIGH (ref 70–99)
POTASSIUM: 4.1 meq/L (ref 3.7–5.3)
Sodium: 137 mEq/L (ref 137–147)
Total Protein: 6.8 g/dL (ref 6.0–8.3)

## 2013-10-07 LAB — BASIC METABOLIC PANEL
ANION GAP: 17 — AB (ref 5–15)
BUN: 24 mg/dL — ABNORMAL HIGH (ref 6–23)
CHLORIDE: 95 meq/L — AB (ref 96–112)
CO2: 29 meq/L (ref 19–32)
CREATININE: 0.99 mg/dL (ref 0.50–1.10)
Calcium: 9.8 mg/dL (ref 8.4–10.5)
GFR calc Af Amer: 58 mL/min — ABNORMAL LOW (ref >90)
GFR calc non Af Amer: 50 mL/min — ABNORMAL LOW (ref >90)
Glucose, Bld: 119 mg/dL — ABNORMAL HIGH (ref 70–99)
POTASSIUM: 4.5 meq/L (ref 3.7–5.3)
Sodium: 141 mEq/L (ref 137–147)

## 2013-10-07 LAB — PROTIME-INR
INR: 1.03 (ref 0.00–1.49)
Prothrombin Time: 13.5 seconds (ref 11.6–15.2)

## 2013-10-07 LAB — GLUCOSE, CAPILLARY
Glucose-Capillary: 172 mg/dL — ABNORMAL HIGH (ref 70–99)
Glucose-Capillary: 207 mg/dL — ABNORMAL HIGH (ref 70–99)
Glucose-Capillary: 169 mg/dL — ABNORMAL HIGH (ref 70–99)

## 2013-10-07 LAB — TYPE AND SCREEN
ABO/RH(D): A POS
Antibody Screen: NEGATIVE

## 2013-10-07 LAB — HEMOGLOBIN A1C
HEMOGLOBIN A1C: 6.8 % — AB (ref <5.7)
MEAN PLASMA GLUCOSE: 148 mg/dL — AB (ref <117)

## 2013-10-07 LAB — APTT: aPTT: 31 seconds (ref 24–37)

## 2013-10-07 LAB — ABO/RH: ABO/RH(D): A POS

## 2013-10-07 MED ORDER — DIAZEPAM 2 MG PO TABS
2.0000 mg | ORAL_TABLET | Freq: Once | ORAL | Status: AC
  Administered 2013-10-09: 2 mg via ORAL
  Filled 2013-10-07: qty 1

## 2013-10-07 MED ORDER — INSULIN ASPART 100 UNIT/ML ~~LOC~~ SOLN
0.0000 [IU] | Freq: Three times a day (TID) | SUBCUTANEOUS | Status: DC
  Administered 2013-10-07: 5 [IU] via SUBCUTANEOUS
  Administered 2013-10-08: 2 [IU] via SUBCUTANEOUS
  Administered 2013-10-08: 3 [IU] via SUBCUTANEOUS
  Administered 2013-10-08: 2 [IU] via SUBCUTANEOUS
  Administered 2013-10-09: 3 [IU] via SUBCUTANEOUS

## 2013-10-07 MED ORDER — BISACODYL 5 MG PO TBEC
5.0000 mg | DELAYED_RELEASE_TABLET | Freq: Once | ORAL | Status: AC
  Administered 2013-10-08: 5 mg via ORAL
  Filled 2013-10-07: qty 1

## 2013-10-07 MED ORDER — ALPRAZOLAM 0.25 MG PO TABS: 0.2500 mg | ORAL_TABLET | ORAL | Status: DC | PRN

## 2013-10-07 MED ORDER — INSULIN ASPART 100 UNIT/ML ~~LOC~~ SOLN
0.0000 [IU] | Freq: Every day | SUBCUTANEOUS | Status: DC
  Administered 2013-10-08: 2 [IU] via SUBCUTANEOUS

## 2013-10-07 MED ORDER — METOPROLOL TARTRATE 12.5 MG HALF TABLET
12.5000 mg | ORAL_TABLET | Freq: Once | ORAL | Status: AC
  Administered 2013-10-09: 12.5 mg via ORAL
  Filled 2013-10-07: qty 1

## 2013-10-07 MED ORDER — TEMAZEPAM 15 MG PO CAPS: 15.0000 mg | ORAL_CAPSULE | Freq: Once | ORAL | Status: AC | PRN

## 2013-10-07 MED ORDER — METOPROLOL TARTRATE 12.5 MG HALF TABLET
12.5000 mg | ORAL_TABLET | Freq: Four times a day (QID) | ORAL | Status: DC
  Administered 2013-10-07 – 2013-10-09 (×6): 12.5 mg via ORAL
  Filled 2013-10-07 (×13): qty 1

## 2013-10-07 NOTE — Care Management Note (Addendum)
    Page 1 of 2   10/21/2013     1:41:30 PM CARE MANAGEMENT NOTE 10/21/2013  Patient:  Marissa Jefferson, Marissa Jefferson   Account Number:  [id number]  Date Initiated:  10/07/2013  Documentation initiated by:  Fuller Mandril  Subjective/Objective Assessment:   80 yo female Newly diagnosed congestive heart failure.//Home alone     Action/Plan:   right and left heart catheterization with coronary angiography. //Access for [facility] services   Anticipated DC Date:  10/21/2013   Anticipated DC Plan:  SKILLED NURSING FACILITY  In-house referral  Clinical Social Worker      DC Planning Services  CM consult      Choice offered to / List presented to:             Status of service:  Completed, signed off Medicare Important Message given?  YES (If response is "NO", the following Medicare IM given date fields will be blank) Date Medicare IM given:  10/12/2013 Medicare IM given by:  MAYO,HENRIETTA Date Additional Medicare IM given:  10/19/2013 Additional Medicare IM given by:  Makaia Rappa  Discharge Disposition:  [place]  Per UR Regulation:  Reviewed for med. necessity/level of care/duration of stay  If discussed at [place] of Stay Meetings, dates discussed:   10/13/2013  10/15/2013  10/20/2013    Comments:  10/21/13 Ellan Lambert, RN, BSN [phone] Pt discharged to SNF today, per CSW arrangements.  10/19/13 Ellan Lambert, RN, BSN [phone] CSW cont to follow for SNF at dc when medically stable. Will follow progress.  10/15/13 1126 [place] MSN BSN CCM Extubated, gtts weaned, per CSW, has SNF bed for rehab.  10/12/13 Buda RN MSN BSN CCM s/p CABG, on amio/neo/milrinong gtts, vent.  10/08/13 Ellan Lambert, RN, BSN [phone] Pt for CABG on 10/09/13. Pt lives alone, and is requesting SNF at [facility] upon dc.  Will consult CSW to follow up post op with [facility] for rehab at dc.  10/07/13 [place], RN, BSN, NCM [phone] Pre-CABG testing ongoing, once  completed, final decision on CABG/PCI to be made. Daughter should be present.

## 2013-10-07 NOTE — Progress Notes (Signed)
Pt received into room 2w30, daughter at bedside, pt states no pain at this time, pt sitting in bed eating, tele placed on pt, vitals taken, will continue to monitor Rickard Rhymes, RN

## 2013-10-07 NOTE — Progress Notes (Signed)
Stable today with no chest pain or dyspnea. Continue w/u for possible CABG. No discharge planned.

## 2013-10-07 NOTE — Progress Notes (Addendum)
Pre-op Cardiac Surgery  Carotid Findings:  Findings suggest 1-39% internal carotid artery stenosis bilaterally. Vertebral arteries are patent with antegrade flow.  10/07/2013 3:45 PM Michelle Simonetti, RVT, RDCS, RDMS   Limb dopplers pending. Upper Extremity Right Left  Brachial Pressures 119 Triohasic 119 Triphasic  Radial Waveforms Triphasic Triphasic  Ulnar Waveforms Triphasic Triphasic  Palmar Arch (Allen's Test) Normal Normal   Findings:  Doppler waveforms remained normal bilaterally with both radial and ulnar compressions    Lower  Extremity Right Left  Dorsalis Pedis 119 Triphasic 119 Triphasic  Anterior Tibial 96 Triphasic 122 Triphasic  Posterior Tibial 157 Triphasic 138 Triphasic  Ankle/Brachial Indices 1.32 1.16    Findings:  Mild technical difficulty obtaining pressures due to cardiac arrytnmia ABIs and Doppler waveforms are within normal limits bilaterally at rest.   Toma Copier , RVS 10/07/2013, 5:15 PM

## 2013-10-07 NOTE — Progress Notes (Signed)
Patient Name: Marissa Jefferson Date of Encounter: 10/07/2013  Principal Problem:   Acute on chronic combined systolic and diastolic CHF, NYHA class 3 Active Problems:   CAD (coronary artery disease), native coronary artery   Ischemic dilated cardiomyopathy   NSVT (nonsustained ventricular tachycardia)    Patient Profile: 80 yo female w/ no previous CAD, hx HTN, DM, HLD, eval for CHF--> echo w/ EF 20-25%, R/L cath 07/28 w/ LAD 90%, CFX 90%, RCA 100%, high risk for surgery, pt reluctant, may need left-sided PCI  SUBJECTIVE: No chest pain, no SOB, unsure re: CABG, still considering it, but leaning more toward surgery today.  OBJECTIVE Filed Vitals:   10/06/13 2100 10/06/13 2338 10/07/13 0406 10/07/13 0600  BP: 109/59 146/69 123/68   Pulse: 72 110 108   Temp:  98.4 F (36.9 C) 98.2 F (36.8 C)   TempSrc:  Oral Oral   Resp:  18 20   Height:      Weight:    150 lb 12.7 oz (68.4 kg)  SpO2: 91% 93% 92%     Intake/Output Summary (Last 24 hours) at 10/07/13 1000 Last data filed at 10/07/13 0600  Gross per 24 hour  Intake    120 ml  Output    300 ml  Net   -180 ml   Filed Weights   10/06/13 1356 10/07/13 0600  Weight: 154 lb (69.854 kg) 150 lb 12.7 oz (68.4 kg)    PHYSICAL EXAM General: Well developed, well nourished, female in no acute distress. Head: Normocephalic, atraumatic.  Neck: Supple without bruits, JVD at 9 cm. Lungs:  Resp regular and unlabored, decreased BS bases Heart: RRR, S1, S2, no S3, S4, soft systolic murmur; no rub. Abdomen: Soft, non-tender, non-distended, BS + x 4.  Extremities: No clubbing, cyanosis, no edema. Right groin without ecchymosis, hematoma, bruit Neuro: Alert and oriented X 3. Moves all extremities spontaneously. Psych: Normal affect.  LABS: CBC:  Recent Labs  10/05/13 1108  WBC 9.3  HGB 14.4  HCT 41.6  MCV 91.8  PLT 325   INR:  Recent Labs  10/05/13 1108  INR 9.32   Basic Metabolic Panel:  Recent Labs   10/05/13 1108 10/07/13 0350  NA 134* 141  K 4.7 4.5  CL 95* 95*  CO2 24 29  GLUCOSE 131* 119*  BUN 24* 24*  CREATININE 0.96 0.99  CALCIUM 10.3 9.8   Fasting Lipid Panel:  Recent Labs  10/07/13 0350  CHOL 148  HDL 40  LDLCALC 86  TRIG 111  CHOLHDL 3.7   TELE: SR, ST, 3-5 bt runs NSVT  Radiology/Studies:  Ct Chest Wo Contrast 10/07/2013   CLINICAL DATA:  Potential candidate for CABG. Aorta appeared to be heavily calcified on catheterization.  EXAM: CT CHEST WITHOUT CONTRAST  TECHNIQUE: Multidetector CT imaging of the chest was performed following the standard protocol without IV contrast.  COMPARISON:  None.  FINDINGS: No pathologically enlarged mediastinal or axillary lymph nodes. Hilar regions are difficult to definitively evaluate without IV contrast. Aorta is moderately calcified. Extensive 3 vessel coronary artery calcification. Heart is mildly enlarged. No pericardial effusion. Small hiatal hernia. Favor a dilated left inferior pulmonary vein (series 2, image 32), rather than an enlarged lymph node.  Tiny right pleural effusion. Mild biapical pleural parenchymal scarring with subpleural extension inferiorly. Triangular-shaped nodule in the right lower lobe, adjacent to the right major fissure measures 5 mm (4 x 5 mm). Airway is unremarkable.  Incidental imaging of the upper abdomen  shows the visualized portions of the liver, adrenal glands, kidneys, spleen and stomach to be otherwise grossly unremarkable. No worrisome lytic or sclerotic lesions. Degenerative changes are seen in the spine.  IMPRESSION: 1. Atherosclerotic calcification of the arterial vasculature, including extensive three-vessel involvement of the coronary arteries. 2. Tiny right pleural effusion. 3. 5 mm subpleural nodule along the right major fissure may represent a subpleural lymph node. If the patient is at high risk for bronchogenic carcinoma, follow-up chest CT at 6-12 months is recommended. If the patient is at  low risk for bronchogenic carcinoma, follow-up chest CT at 12 months is recommended. This recommendation follows the consensus statement: Guidelines for Management of Small Pulmonary Nodules Detected on CT Scans: A Statement from the [place] as published in Radiology 2005;237:395-400.   Electronically Signed   By: Lorin Picket M.D.   On: 10/07/2013 09:36   Current Medications:  . aspirin  81 mg Oral Daily  . atorvastatin  80 mg Oral Daily  . furosemide  40 mg Oral Daily  . heparin subcutaneous  5,000 Units Subcutaneous 3 times per day  . levothyroxine  112 mcg Oral QAC breakfast  . lisinopril  40 mg Oral Daily  . oxybutynin  30 mg Oral QHS  . pantoprazole  40 mg Oral Daily  . potassium chloride  20 mEq Oral Daily      ASSESSMENT AND PLAN: Principal Problem:   Acute on chronic combined systolic and diastolic CHF, NYHA class 3 - on oral Lasix now, follow weights, I/O, BMET  Active Problems:   CAD (coronary artery disease), native coronary artery - pre-CABG testing ongoing, once completed, final decision on CABG/PCI to be made. Daughter should be present.    Ischemic dilated cardiomyopathy - on Lasix, ACE, believe BP will tolerate BB, add metoprolol 12.5 mg q 6 hr, change to 25 mg BID in am if tolerated well.    NSVT (nonsustained ventricular tachycardia) - asymptomatic, adding BB. May need LifeVest/ICD, MD review data and advise.   Jonetta Speak , PA-C 10:00 AM 10/07/2013

## 2013-10-08 ENCOUNTER — Inpatient Hospital Stay (HOSPITAL_COMMUNITY): Payer: Medicare Other

## 2013-10-08 ENCOUNTER — Encounter (HOSPITAL_COMMUNITY): Payer: Self-pay | Admitting: General Practice

## 2013-10-08 DIAGNOSIS — I209 Angina pectoris, unspecified: Secondary | ICD-10-CM

## 2013-10-08 LAB — BASIC METABOLIC PANEL
ANION GAP: 15 (ref 5–15)
BUN: 26 mg/dL — ABNORMAL HIGH (ref 6–23)
CO2: 30 meq/L (ref 19–32)
CREATININE: 0.89 mg/dL (ref 0.50–1.10)
Calcium: 9.7 mg/dL (ref 8.4–10.5)
Chloride: 97 mEq/L (ref 96–112)
GFR calc Af Amer: 66 mL/min — ABNORMAL LOW (ref >90)
GFR calc non Af Amer: 57 mL/min — ABNORMAL LOW (ref >90)
Glucose, Bld: 145 mg/dL — ABNORMAL HIGH (ref 70–99)
POTASSIUM: 4.3 meq/L (ref 3.7–5.3)
Sodium: 142 mEq/L (ref 137–147)

## 2013-10-08 LAB — CBC
HEMATOCRIT: 39.5 % (ref 36.0–46.0)
Hemoglobin: 13.1 g/dL (ref 12.0–15.0)
MCH: 32 pg (ref 26.0–34.0)
MCHC: 33.2 g/dL (ref 30.0–36.0)
MCV: 96.3 fL (ref 78.0–100.0)
Platelets: 250 10*3/uL (ref 150–400)
RBC: 4.1 MIL/uL (ref 3.87–5.11)
RDW: 12.6 % (ref 11.5–15.5)
WBC: 9.6 10*3/uL (ref 4.0–10.5)

## 2013-10-08 LAB — GLUCOSE, CAPILLARY
GLUCOSE-CAPILLARY: 165 mg/dL — AB (ref 70–99)
GLUCOSE-CAPILLARY: 149 mg/dL — AB (ref 70–99)
Glucose-Capillary: 149 mg/dL — ABNORMAL HIGH (ref 70–99)
Glucose-Capillary: 236 mg/dL — ABNORMAL HIGH (ref 70–99)

## 2013-10-08 LAB — URINALYSIS, ROUTINE W REFLEX MICROSCOPIC
BILIRUBIN URINE: NEGATIVE
GLUCOSE, UA: NEGATIVE mg/dL
HGB URINE DIPSTICK: NEGATIVE
Ketones, ur: NEGATIVE mg/dL
Nitrite: NEGATIVE
PH: 6.5 (ref 5.0–8.0)
PROTEIN: NEGATIVE mg/dL
Specific Gravity, Urine: 1.015 (ref 1.005–1.030)
Urobilinogen, UA: 1 mg/dL (ref 0.0–1.0)

## 2013-10-08 LAB — URINE MICROSCOPIC-ADD ON

## 2013-10-08 LAB — HEMOGLOBIN A1C
HEMOGLOBIN A1C: 6.8 % — AB (ref <5.7)
MEAN PLASMA GLUCOSE: 148 mg/dL — AB (ref <117)

## 2013-10-08 LAB — SURGICAL PCR SCREEN
MRSA, PCR: NEGATIVE
Staphylococcus aureus: NEGATIVE

## 2013-10-08 MED ORDER — SODIUM CHLORIDE 0.9 % IV SOLN
INTRAVENOUS | Status: DC
  Administered 2013-10-09: 2.6 [IU]/h via INTRAVENOUS
  Filled 2013-10-08: qty 1

## 2013-10-08 MED ORDER — DOPAMINE-DEXTROSE 3.2-5 MG/ML-% IV SOLN
2.0000 ug/kg/min | INTRAVENOUS | Status: DC
  Filled 2013-10-08: qty 250

## 2013-10-08 MED ORDER — POTASSIUM CHLORIDE 2 MEQ/ML IV SOLN
80.0000 meq | INTRAVENOUS | Status: DC
  Filled 2013-10-08: qty 40

## 2013-10-08 MED ORDER — VANCOMYCIN HCL 10 G IV SOLR
1250.0000 mg | INTRAVENOUS | Status: DC
  Administered 2013-10-09: 1250 mg via INTRAVENOUS
  Filled 2013-10-08: qty 1250

## 2013-10-08 MED ORDER — DEXMEDETOMIDINE HCL IN NACL 400 MCG/100ML IV SOLN
0.1000 ug/kg/h | INTRAVENOUS | Status: DC
  Administered 2013-10-09: 0.2 ug/kg/h via INTRAVENOUS
  Filled 2013-10-08: qty 100

## 2013-10-08 MED ORDER — DEXTROSE 5 % IV SOLN
30.0000 ug/min | INTRAVENOUS | Status: DC
  Administered 2013-10-09: 50 ug/min via INTRAVENOUS
  Filled 2013-10-08: qty 2

## 2013-10-08 MED ORDER — CHLORHEXIDINE GLUCONATE 4 % EX LIQD
60.0000 mL | Freq: Once | CUTANEOUS | Status: AC
  Administered 2013-10-09: 4 via TOPICAL
  Filled 2013-10-08 (×2): qty 60

## 2013-10-08 MED ORDER — CHLORHEXIDINE GLUCONATE 4 % EX LIQD
60.0000 mL | Freq: Once | CUTANEOUS | Status: AC
  Administered 2013-10-08: 4 via TOPICAL
  Filled 2013-10-08 (×2): qty 60

## 2013-10-08 MED ORDER — PAPAVERINE HCL 30 MG/ML IJ SOLN
INTRAMUSCULAR | Status: DC
  Filled 2013-10-08: qty 2.5

## 2013-10-08 MED ORDER — NITROGLYCERIN IN D5W 200-5 MCG/ML-% IV SOLN
2.0000 ug/min | INTRAVENOUS | Status: DC
  Administered 2013-10-09: 5 ug/min via INTRAVENOUS
  Filled 2013-10-08: qty 250

## 2013-10-08 MED ORDER — SODIUM CHLORIDE 0.9 % IV SOLN
INTRAVENOUS | Status: DC
  Filled 2013-10-08: qty 30

## 2013-10-08 MED ORDER — CEFUROXIME SODIUM 1.5 G IJ SOLR
1.5000 g | INTRAMUSCULAR | Status: DC
  Administered 2013-10-09: .75 g via INTRAVENOUS
  Administered 2013-10-09: 1.5 g via INTRAVENOUS
  Filled 2013-10-08: qty 1.5

## 2013-10-08 MED ORDER — SODIUM CHLORIDE 0.9 % IV SOLN
INTRAVENOUS | Status: DC
Start: 2013-10-09 — End: 2013-10-09
  Administered 2013-10-09: 70 mL/h via INTRAVENOUS
  Filled 2013-10-08: qty 40

## 2013-10-08 MED ORDER — MAGNESIUM SULFATE 50 % IJ SOLN
40.0000 meq | INTRAMUSCULAR | Status: DC
  Filled 2013-10-08: qty 10

## 2013-10-08 MED ORDER — EPINEPHRINE HCL 1 MG/ML IJ SOLN
0.5000 ug/min | INTRAVENOUS | Status: DC
  Filled 2013-10-08: qty 4

## 2013-10-08 MED ORDER — DEXTROSE 5 % IV SOLN
750.0000 mg | INTRAVENOUS | Status: DC
  Filled 2013-10-08: qty 750

## 2013-10-08 NOTE — Progress Notes (Signed)
Patient Name: Marissa Jefferson Date of Encounter: 10/08/2013  Principal Problem:   Acute on chronic combined systolic and diastolic CHF, NYHA class 3 Active Problems:   CAD (coronary artery disease), native coronary artery   Ischemic dilated cardiomyopathy   NSVT (nonsustained ventricular tachycardia)    Patient Profile: 80 yo female w/ no previous CAD, hx HTN, DM, HLD, eval for CHF--> echo w/ EF 20-25%, R/L cath 07/28 w/ LAD 90%, CFX 90%, RCA 100%, surgery being considered, pt intially reluctant but now feels more favorable toward CABG. If no CABG, may need left-sided PCI  SUBJECTIVE: No chest pain, no SOB. Dr. Roxan Hockey came by last pm, she is for CABG in am, first case. She has agreed.  OBJECTIVE Filed Vitals:   10/07/13 1427 10/07/13 1805 10/07/13 2038 10/08/13 0536  BP: 113/68 127/82 118/82 108/70  Pulse: 83 104 81 93  Temp: 98.1 F (36.7 C)  97.4 F (36.3 C) 97.9 F (36.6 C)  TempSrc: Oral  Oral Oral  Resp: 18  18 18   Height:      Weight:    148 lb 14.4 oz (67.541 kg)  SpO2: 93% 95% 93% 93%    Intake/Output Summary (Last 24 hours) at 10/08/13 1233 Last data filed at 10/07/13 1420  Gross per 24 hour  Intake    120 ml  Output    300 ml  Net   -180 ml   Filed Weights   10/06/13 1356 10/07/13 0600 10/08/13 0536  Weight: 154 lb (69.854 kg) 150 lb 12.7 oz (68.4 kg) 148 lb 14.4 oz (67.541 kg)    PHYSICAL EXAM General: Well developed, well nourished, female in no acute distress. Head: Normocephalic, atraumatic.  Neck: Supple without bruits, JVD not elevated. Lungs:  Resp regular and unlabored, scattered rales. Heart: RRR, S1, S2, no S3, S4, or murmur; no rub. Abdomen: Soft, non-tender, non-distended, BS + x 4.  Extremities: No clubbing, cyanosis, no edema.  Neuro: Alert and oriented X 3. Moves all extremities spontaneously. Psych: Normal affect.  LABS: CBC: Recent Labs  10/08/13 0538  WBC 9.6  HGB 13.1  HCT 39.5  MCV 96.3  PLT 250   INR: Recent  Labs  10/07/13 1935  INR 2.95   Basic Metabolic Panel: Recent Labs  10/07/13 1935 10/08/13 0538  NA 137 142  K 4.1 4.3  CL 93* 97  CO2 24 30  GLUCOSE 196* 145*  BUN 28* 26*  CREATININE 0.93 0.89  CALCIUM 9.9 9.7   Liver Function Tests: Recent Labs  10/07/13 1935  AST 15  ALT 10  ALKPHOS 50  BILITOT 0.4  PROT 6.8  ALBUMIN 3.9   Hemoglobin A1C: Recent Labs  10/07/13 1935  HGBA1C 6.8*   Fasting Lipid Panel: Recent Labs  10/07/13 0350  CHOL 148  HDL 40  LDLCALC 86  TRIG 111  CHOLHDL 3.7    TELE:   SR,  Multifocal PVCs and pairs.  Radiology/Studies: Ct Chest Wo Contrast 10/07/2013   CLINICAL DATA:  Potential candidate for CABG. Aorta appeared to be heavily calcified on catheterization.  EXAM: CT CHEST WITHOUT CONTRAST  TECHNIQUE: Multidetector CT imaging of the chest was performed following the standard protocol without IV contrast.  COMPARISON:  None.  FINDINGS: No pathologically enlarged mediastinal or axillary lymph nodes. Hilar regions are difficult to definitively evaluate without IV contrast. Aorta is moderately calcified. Extensive 3 vessel coronary artery calcification. Heart is mildly enlarged. No pericardial effusion. Small hiatal hernia. Favor a dilated left inferior  pulmonary vein (series 2, image 32), rather than an enlarged lymph node.  Tiny right pleural effusion. Mild biapical pleural parenchymal scarring with subpleural extension inferiorly. Triangular-shaped nodule in the right lower lobe, adjacent to the right major fissure measures 5 mm (4 x 5 mm). Airway is unremarkable.  Incidental imaging of the upper abdomen shows the visualized portions of the liver, adrenal glands, kidneys, spleen and stomach to be otherwise grossly unremarkable. No worrisome lytic or sclerotic lesions. Degenerative changes are seen in the spine.  IMPRESSION: 1. Atherosclerotic calcification of the arterial vasculature, including extensive three-vessel involvement of the coronary  arteries. 2. Tiny right pleural effusion. 3. 5 mm subpleural nodule along the right major fissure may represent a subpleural lymph node. If the patient is at high risk for bronchogenic carcinoma, follow-up chest CT at 6-12 months is recommended. If the patient is at low risk for bronchogenic carcinoma, follow-up chest CT at 12 months is recommended. This recommendation follows the consensus statement: Guidelines for Management of Small Pulmonary Nodules Detected on CT Scans: A Statement from the [place] as published in Radiology 2005;237:395-400.   Electronically Signed   By: Lorin Picket M.D.   On: 10/07/2013 09:36     Current Medications:  . aspirin  81 mg Oral Daily  . atorvastatin  80 mg Oral Daily  . bisacodyl  5 mg Oral Once  . [START ON 10/09/2013] diazepam  2 mg Oral Once  . furosemide  40 mg Oral Daily  . heparin subcutaneous  5,000 Units Subcutaneous 3 times per day  . insulin aspart  0-15 Units Subcutaneous TID WC  . insulin aspart  0-5 Units Subcutaneous QHS  . levothyroxine  112 mcg Oral QAC breakfast  . lisinopril  40 mg Oral Daily  . metoprolol tartrate  12.5 mg Oral 4 times per day  . metoprolol tartrate  12.5 mg Oral Once  . oxybutynin  30 mg Oral QHS  . pantoprazole  40 mg Oral Daily  . potassium chloride  20 mEq Oral Daily      ASSESSMENT AND PLAN: Principal Problem:  Acute on chronic combined systolic and diastolic CHF, NYHA class 3 - on oral Lasix now, follow weights, I/O, BMET. Think she is at dry weight.   Active Problems:  CAD (coronary artery disease), native coronary artery - pre-CABG testing completed, She had decided on CABG. For am.   Ischemic dilated cardiomyopathy - on Lasix, ACE, believe BP will tolerate BB, add metoprolol 12.5 mg q 6 hr, change to 25 mg BID after surgery if tolerated well.   NSVT (nonsustained ventricular tachycardia) - asymptomatic, adding BB. May need LifeVest/ICD, re-assess after CABG and decide then.  Signed, Rosaria Ferries , PA-C 12:33 PM 10/08/2013   I have examined the patient and reviewed assessment and plan and discussed with patient.  Agree with above as stated.  Plan for CABG in AM.  Multivessel CAD.  Joye Wesenberg S.

## 2013-10-08 NOTE — Progress Notes (Signed)
2 Days Post-Op Procedure(s) (LRB): LEFT AND RIGHT HEART CATHETERIZATION WITH CORONARY ANGIOGRAM ([name]) Subjective: No CP or SOB She is ready to proceed with CABG  Objective: Vital signs in last 24 hours: Temp:  [97.4 F (36.3 C)-97.9 F (36.6 C)] 97.4 F (36.3 C) (07/30 1500) Pulse Rate:  [81-93] 82 (07/30 1500) Cardiac Rhythm:  [-] Normal sinus rhythm;Sinus tachycardia (07/29 2000) Resp:  [18] 18 (07/30 1500) BP: (95-118)/(58-82) 95/58 mmHg (07/30 1500) SpO2:  [93 %-98 %] 98 % (07/30 1500) Weight:  [148 lb 14.4 oz (67.541 kg)] 148 lb 14.4 oz (67.541 kg) (07/30 0536)  Hemodynamic parameters for last 24 hours:    Intake/Output from previous day: 07/29 0701 - 07/30 0700 In: 120 [P.O.:120] Out: 300 [Urine:300] Intake/Output this shift: Total I/O In: 360 [P.O.:360] Out: -   General appearance: alert and no distress Neurologic: intact Heart: regular rate and rhythm Lungs: clear to auscultation bilaterally  Lab Results:  Recent Labs  10/08/13 0538  WBC 9.6  HGB 13.1  HCT 39.5  PLT 250   BMET:  Recent Labs  10/07/13 1935 10/08/13 0538  NA 137 142  K 4.1 4.3  CL 93* 97  CO2 24 30  GLUCOSE 196* 145*  BUN 28* 26*  CREATININE 0.93 0.89  CALCIUM 9.9 9.7    PT/INR:  Recent Labs  10/07/13 1935  LABPROT 13.5  INR 1.03   ABG    Component Value Date/Time   PHART 7.468* 10/07/2013 1830   HCO3 25.9* 10/07/2013 1830   TCO2 27.0 10/07/2013 1830   ACIDBASEDEF 1.0 10/06/2013 1621   O2SAT 94.8 10/07/2013 1830   CBG (last 3)   Recent Labs  10/08/13 0629 10/08/13 1138 10/08/13 1657  GLUCAP 149* 165* 149*    Assessment/Plan: S/P Procedure(s) (LRB): LEFT AND RIGHT HEART CATHETERIZATION WITH CORONARY ANGIOGRAM ([name]) - Severe 3 vessel CAD with impaired LV function(EF 20%) Needs CABG for survival benefit and relief of symptoms.  She is aware of the risks and benefits and wishes to proceed For OR in AM All questions answered   LOS: 2 days     Bea Duren C 10/08/2013

## 2013-10-08 NOTE — Progress Notes (Signed)
Surgical and blood consent obtained and placed in chart. Elmarie Shiley R

## 2013-10-08 NOTE — Progress Notes (Signed)
Discussed sternal precautions, mobility, d/c planning with pt. She is interested in SNF at d/c. Lives on second floor apartment alone. Gave IS and instructions, inspiring 1250 ml. Discussed OHS guideline and book. Pt will probably benefit from PT post op, sts she has bad knees. Declined walking today. Encouraged pt to be up with family later and sit in recliner, which she agreed to.  Snyder, ACSM 1:57 PM 10/08/2013

## 2013-10-09 ENCOUNTER — Encounter (HOSPITAL_COMMUNITY): Payer: Self-pay | Admitting: Certified Registered"

## 2013-10-09 ENCOUNTER — Inpatient Hospital Stay (HOSPITAL_COMMUNITY): Payer: Medicare Other

## 2013-10-09 ENCOUNTER — Encounter (HOSPITAL_COMMUNITY): Payer: Medicare Other | Admitting: Certified Registered"

## 2013-10-09 ENCOUNTER — Encounter (HOSPITAL_COMMUNITY)
Admission: RE | Disposition: A | Discharge status: Skilled Nursing Facility | Payer: Medicare Other | Source: Ambulatory Visit | Attending: Thoracic Surgery (Cardiothoracic Vascular Surgery)

## 2013-10-09 ENCOUNTER — Inpatient Hospital Stay (HOSPITAL_COMMUNITY): Payer: Medicare Other | Admitting: Certified Registered"

## 2013-10-09 DIAGNOSIS — R0989 Other specified symptoms and signs involving the circulatory and respiratory systems: Secondary | ICD-10-CM

## 2013-10-09 DIAGNOSIS — I2510 Atherosclerotic heart disease of native coronary artery without angina pectoris: Secondary | ICD-10-CM

## 2013-10-09 DIAGNOSIS — R0609 Other forms of dyspnea: Secondary | ICD-10-CM

## 2013-10-09 DIAGNOSIS — Z951 Presence of aortocoronary bypass graft: Secondary | ICD-10-CM

## 2013-10-09 HISTORY — PX: INTRAOPERATIVE TRANSESOPHAGEAL ECHOCARDIOGRAM: SHX5062

## 2013-10-09 HISTORY — PX: CORONARY ARTERY BYPASS GRAFT: SHX141

## 2013-10-09 LAB — CBC
HCT: 30 % — ABNORMAL LOW (ref 36.0–46.0)
HCT: 28.4 % — ABNORMAL LOW (ref 36.0–46.0)
HEMOGLOBIN: 10.3 g/dL — AB (ref 12.0–15.0)
Hemoglobin: 9.6 g/dL — ABNORMAL LOW (ref 12.0–15.0)
MCH: 33.2 pg (ref 26.0–34.0)
MCH: 32.2 pg (ref 26.0–34.0)
MCHC: 34.3 g/dL (ref 30.0–36.0)
MCHC: 33.8 g/dL (ref 30.0–36.0)
MCV: 96.8 fL (ref 78.0–100.0)
MCV: 95.3 fL (ref 78.0–100.0)
PLATELETS: 158 10*3/uL (ref 150–400)
Platelets: 175 10*3/uL (ref 150–400)
RBC: 3.1 MIL/uL — ABNORMAL LOW (ref 3.87–5.11)
RBC: 2.98 MIL/uL — ABNORMAL LOW (ref 3.87–5.11)
RDW: 12.5 % (ref 11.5–15.5)
RDW: 12.7 % (ref 11.5–15.5)
WBC: 15.8 10*3/uL — ABNORMAL HIGH (ref 4.0–10.5)
WBC: 13.3 10*3/uL — AB (ref 4.0–10.5)

## 2013-10-09 LAB — POCT I-STAT 3, ART BLOOD GAS (G3+)
ACID-BASE DEFICIT: 1 mmol/L (ref 0.0–2.0)
BICARBONATE: 24.6 meq/L — AB (ref 20.0–24.0)
Bicarbonate: 25.2 mEq/L — ABNORMAL HIGH (ref 20.0–24.0)
Bicarbonate: 25 mEq/L — ABNORMAL HIGH (ref 20.0–24.0)
Bicarbonate: 24.1 mEq/L — ABNORMAL HIGH (ref 20.0–24.0)
O2 SAT: 95 %
O2 SAT: 96 %
O2 Saturation: 100 %
O2 Saturation: 96 %
Patient temperature: 37.5
TCO2: 26 mmol/L (ref 0–100)
TCO2: 26 mmol/L (ref 0–100)
TCO2: 26 mmol/L (ref 0–100)
TCO2: 25 mmol/L (ref 0–100)
pCO2 arterial: 43.2 mmHg (ref 35.0–45.0)
pCO2 arterial: 37.2 mmHg (ref 35.0–45.0)
pCO2 arterial: 41.1 mmHg (ref 35.0–45.0)
pCO2 arterial: 41.1 mmHg (ref 35.0–45.0)
pH, Arterial: 7.373 (ref 7.350–7.450)
pH, Arterial: 7.431 (ref 7.350–7.450)
pH, Arterial: 7.386 (ref 7.350–7.450)
pH, Arterial: 7.378 (ref 7.350–7.450)
pO2, Arterial: 326 mmHg — ABNORMAL HIGH (ref 80.0–100.0)
pO2, Arterial: 70 mmHg — ABNORMAL LOW (ref 80.0–100.0)
pO2, Arterial: 84 mmHg (ref 80.0–100.0)
pO2, Arterial: 83 mmHg (ref 80.0–100.0)

## 2013-10-09 LAB — POCT I-STAT, CHEM 8
BUN: 23 mg/dL (ref 6–23)
BUN: 23 mg/dL (ref 6–23)
BUN: 19 mg/dL (ref 6–23)
BUN: 20 mg/dL (ref 6–23)
BUN: 19 mg/dL (ref 6–23)
BUN: 15 mg/dL (ref 6–23)
CALCIUM ION: 1.25 mmol/L (ref 1.13–1.30)
CALCIUM ION: 1.26 mmol/L (ref 1.13–1.30)
CALCIUM ION: 1.01 mmol/L — AB (ref 1.13–1.30)
CHLORIDE: 97 meq/L (ref 96–112)
CREATININE: 0.6 mg/dL (ref 0.50–1.10)
CREATININE: 0.5 mg/dL (ref 0.50–1.10)
Calcium, Ion: 1.07 mmol/L — ABNORMAL LOW (ref 1.13–1.30)
Calcium, Ion: 1.04 mmol/L — ABNORMAL LOW (ref 1.13–1.30)
Calcium, Ion: 1.27 mmol/L (ref 1.13–1.30)
Chloride: 98 mEq/L (ref 96–112)
Chloride: 99 mEq/L (ref 96–112)
Chloride: 92 mEq/L — ABNORMAL LOW (ref 96–112)
Chloride: 97 mEq/L (ref 96–112)
Chloride: 104 mEq/L (ref 96–112)
Creatinine, Ser: 0.6 mg/dL (ref 0.50–1.10)
Creatinine, Ser: 0.6 mg/dL (ref 0.50–1.10)
Creatinine, Ser: 0.6 mg/dL (ref 0.50–1.10)
Creatinine, Ser: 0.5 mg/dL (ref 0.50–1.10)
GLUCOSE: 153 mg/dL — AB (ref 70–99)
GLUCOSE: 108 mg/dL — AB (ref 70–99)
Glucose, Bld: 145 mg/dL — ABNORMAL HIGH (ref 70–99)
Glucose, Bld: 125 mg/dL — ABNORMAL HIGH (ref 70–99)
Glucose, Bld: 133 mg/dL — ABNORMAL HIGH (ref 70–99)
Glucose, Bld: 155 mg/dL — ABNORMAL HIGH (ref 70–99)
HCT: 27 % — ABNORMAL LOW (ref 36.0–46.0)
HCT: 27 % — ABNORMAL LOW (ref 36.0–46.0)
HEMATOCRIT: 35 % — AB (ref 36.0–46.0)
HEMATOCRIT: 35 % — AB (ref 36.0–46.0)
HEMATOCRIT: 26 % — AB (ref 36.0–46.0)
HEMATOCRIT: 22 % — AB (ref 36.0–46.0)
HEMOGLOBIN: 11.9 g/dL — AB (ref 12.0–15.0)
HEMOGLOBIN: 11.9 g/dL — AB (ref 12.0–15.0)
HEMOGLOBIN: 9.2 g/dL — AB (ref 12.0–15.0)
HEMOGLOBIN: 9.2 g/dL — AB (ref 12.0–15.0)
Hemoglobin: 8.8 g/dL — ABNORMAL LOW (ref 12.0–15.0)
Hemoglobin: 7.5 g/dL — ABNORMAL LOW (ref 12.0–15.0)
POTASSIUM: 3.8 meq/L (ref 3.7–5.3)
POTASSIUM: 4.2 meq/L (ref 3.7–5.3)
Potassium: 3.6 mEq/L — ABNORMAL LOW (ref 3.7–5.3)
Potassium: 3.4 mEq/L — ABNORMAL LOW (ref 3.7–5.3)
Potassium: 4.4 mEq/L (ref 3.7–5.3)
Potassium: 4.2 mEq/L (ref 3.7–5.3)
SODIUM: 133 meq/L — AB (ref 137–147)
SODIUM: 134 meq/L — AB (ref 137–147)
Sodium: 137 mEq/L (ref 137–147)
Sodium: 137 mEq/L (ref 137–147)
Sodium: 133 mEq/L — ABNORMAL LOW (ref 137–147)
Sodium: 137 mEq/L (ref 137–147)
TCO2: 26 mmol/L (ref 0–100)
TCO2: 27 mmol/L (ref 0–100)
TCO2: 25 mmol/L (ref 0–100)
TCO2: 24 mmol/L (ref 0–100)
TCO2: 24 mmol/L (ref 0–100)
TCO2: 23 mmol/L (ref 0–100)

## 2013-10-09 LAB — POCT I-STAT 4, (NA,K, GLUC, HGB,HCT)
GLUCOSE: 122 mg/dL — AB (ref 70–99)
HEMATOCRIT: 29 % — AB (ref 36.0–46.0)
Hemoglobin: 9.9 g/dL — ABNORMAL LOW (ref 12.0–15.0)
POTASSIUM: 3.7 meq/L (ref 3.7–5.3)
SODIUM: 137 meq/L (ref 137–147)

## 2013-10-09 LAB — APTT: APTT: 32 s (ref 24–37)

## 2013-10-09 LAB — GLUCOSE, CAPILLARY
GLUCOSE-CAPILLARY: 121 mg/dL — AB (ref 70–99)
Glucose-Capillary: 178 mg/dL — ABNORMAL HIGH (ref 70–99)
Glucose-Capillary: 87 mg/dL (ref 70–99)
Glucose-Capillary: 104 mg/dL — ABNORMAL HIGH (ref 70–99)
Glucose-Capillary: 114 mg/dL — ABNORMAL HIGH (ref 70–99)
Glucose-Capillary: 100 mg/dL — ABNORMAL HIGH (ref 70–99)
Glucose-Capillary: 120 mg/dL — ABNORMAL HIGH (ref 70–99)

## 2013-10-09 LAB — HEMOGLOBIN AND HEMATOCRIT, BLOOD
HEMATOCRIT: 26.9 % — AB (ref 36.0–46.0)
HEMOGLOBIN: 9.2 g/dL — AB (ref 12.0–15.0)

## 2013-10-09 LAB — PLATELET COUNT: Platelets: 176 10*3/uL (ref 150–400)

## 2013-10-09 LAB — PROTIME-INR
INR: 1.35 (ref 0.00–1.49)
Prothrombin Time: 16.7 seconds — ABNORMAL HIGH (ref 11.6–15.2)

## 2013-10-09 LAB — CREATININE, SERUM
CREATININE: 0.56 mg/dL (ref 0.50–1.10)
GFR calc non Af Amer: 82 mL/min — ABNORMAL LOW (ref >90)

## 2013-10-09 SURGERY — CORONARY ARTERY BYPASS GRAFTING (CABG)
Anesthesia: General | Site: Chest

## 2013-10-09 MED ORDER — ONDANSETRON HCL 4 MG/2ML IJ SOLN
4.0000 mg | Freq: Four times a day (QID) | INTRAMUSCULAR | Status: DC | PRN
  Administered 2013-10-10 – 2013-10-14 (×3): 4 mg via INTRAVENOUS
  Filled 2013-10-09 (×3): qty 2

## 2013-10-09 MED ORDER — MILRINONE IN DEXTROSE 20 MG/100ML IV SOLN
0.1250 ug/kg/min | Freq: Once | INTRAVENOUS | Status: DC
Start: 2013-10-09 — End: 2013-10-09
  Filled 2013-10-09: qty 100

## 2013-10-09 MED ORDER — ASPIRIN 81 MG PO CHEW
324.0000 mg | CHEWABLE_TABLET | Freq: Every day | ORAL | Status: DC
  Administered 2013-10-10: 324 mg
  Filled 2013-10-09 (×2): qty 4

## 2013-10-09 MED ORDER — PANTOPRAZOLE SODIUM 40 MG PO TBEC
40.0000 mg | DELAYED_RELEASE_TABLET | Freq: Every day | ORAL | Status: DC
  Administered 2013-10-11 – 2013-10-21 (×11): 40 mg via ORAL
  Filled 2013-10-09 (×9): qty 1

## 2013-10-09 MED ORDER — FENTANYL CITRATE 0.05 MG/ML IJ SOLN
INTRAMUSCULAR | Status: AC
  Filled 2013-10-09: qty 5

## 2013-10-09 MED ORDER — ROCURONIUM BROMIDE 50 MG/5ML IV SOLN
INTRAVENOUS | Status: AC
  Filled 2013-10-09: qty 1

## 2013-10-09 MED ORDER — SODIUM CHLORIDE 0.9 % IV SOLN
250.0000 mL | INTRAVENOUS | Status: DC
Start: 2013-10-10 — End: 2013-10-15
  Administered 2013-10-11: 250 mL via INTRAVENOUS

## 2013-10-09 MED ORDER — HEPARIN SODIUM (PORCINE) 1000 UNIT/ML IJ SOLN
INTRAMUSCULAR | Status: AC
  Filled 2013-10-09: qty 1

## 2013-10-09 MED ORDER — LACTATED RINGERS IV SOLN: 500.0000 mL | Freq: Once | INTRAVENOUS | Status: AC | PRN

## 2013-10-09 MED ORDER — SODIUM CHLORIDE 0.9 % IJ SOLN
OROMUCOSAL | Status: DC | PRN
  Administered 2013-10-09: 09:00:00 via TOPICAL

## 2013-10-09 MED ORDER — SODIUM CHLORIDE 0.45 % IV SOLN
INTRAVENOUS | Status: DC
  Administered 2013-10-09: 13:00:00 via INTRAVENOUS

## 2013-10-09 MED ORDER — CALCIUM CHLORIDE 10 % IV SOLN
1.0000 g | Freq: Once | INTRAVENOUS | Status: AC
  Administered 2013-10-09: 1 g via INTRAVENOUS
  Filled 2013-10-09: qty 10

## 2013-10-09 MED ORDER — POTASSIUM CHLORIDE 10 MEQ/50ML IV SOLN
10.0000 meq | INTRAVENOUS | Status: AC
  Administered 2013-10-09 (×3): 10 meq via INTRAVENOUS

## 2013-10-09 MED ORDER — SODIUM CHLORIDE 0.9 % IJ SOLN
3.0000 mL | Freq: Two times a day (BID) | INTRAMUSCULAR | Status: DC
  Administered 2013-10-10 – 2013-10-14 (×9): 3 mL via INTRAVENOUS
  Administered 2013-10-15: 12:00:00 via INTRAVENOUS

## 2013-10-09 MED ORDER — PLASMA-LYTE 148 IV SOLN
INTRAVENOUS | Status: DC | PRN
  Administered 2013-10-09: 09:00:00

## 2013-10-09 MED ORDER — MIDAZOLAM HCL 5 MG/5ML IJ SOLN
INTRAMUSCULAR | Status: DC | PRN
  Administered 2013-10-09: 2 mg via INTRAVENOUS
  Administered 2013-10-09: 1 mg via INTRAVENOUS
  Administered 2013-10-09 (×3): 2 mg via INTRAVENOUS
  Administered 2013-10-09: 3 mg via INTRAVENOUS

## 2013-10-09 MED ORDER — MIDAZOLAM HCL 2 MG/2ML IJ SOLN
INTRAMUSCULAR | Status: AC
  Filled 2013-10-09: qty 2

## 2013-10-09 MED ORDER — PHENYLEPHRINE HCL 10 MG/ML IJ SOLN
0.0000 ug/min | INTRAVENOUS | Status: DC
  Administered 2013-10-11: 70 ug/min via INTRAVENOUS
  Administered 2013-10-11: 35 ug/min via INTRAVENOUS
  Administered 2013-10-12: 40 ug/min via INTRAVENOUS
  Filled 2013-10-09 (×4): qty 2

## 2013-10-09 MED ORDER — ASPIRIN EC 325 MG PO TBEC
325.0000 mg | DELAYED_RELEASE_TABLET | Freq: Every day | ORAL | Status: DC
  Administered 2013-10-11 – 2013-10-21 (×11): 325 mg via ORAL
  Filled 2013-10-09 (×12): qty 1

## 2013-10-09 MED ORDER — SODIUM CHLORIDE 0.9 % IJ SOLN
10.0000 mL | Freq: Two times a day (BID) | INTRAMUSCULAR | Status: DC
  Administered 2013-10-09 – 2013-10-14 (×10): 10 mL via INTRAVENOUS
  Administered 2013-10-15: 12:00:00 via INTRAVENOUS

## 2013-10-09 MED ORDER — LACTATED RINGERS IV SOLN
INTRAVENOUS | Status: DC | PRN
  Administered 2013-10-09 (×4): via INTRAVENOUS

## 2013-10-09 MED ORDER — CETYLPYRIDINIUM CHLORIDE 0.05 % MT LIQD
7.0000 mL | Freq: Four times a day (QID) | OROMUCOSAL | Status: DC
  Administered 2013-10-09 – 2013-10-13 (×17): 7 mL via OROMUCOSAL

## 2013-10-09 MED ORDER — INSULIN REGULAR BOLUS VIA INFUSION
0.0000 [IU] | Freq: Three times a day (TID) | INTRAVENOUS | Status: DC
  Filled 2013-10-09: qty 10

## 2013-10-09 MED ORDER — PROTAMINE SULFATE 10 MG/ML IV SOLN
INTRAVENOUS | Status: DC | PRN
  Administered 2013-10-09: 200 mg via INTRAVENOUS

## 2013-10-09 MED ORDER — BISACODYL 10 MG RE SUPP: 10.0000 mg | Freq: Every day | RECTAL | Status: DC

## 2013-10-09 MED ORDER — OXYCODONE HCL 5 MG PO TABS
5.0000 mg | ORAL_TABLET | ORAL | Status: DC | PRN
  Filled 2013-10-09: qty 1

## 2013-10-09 MED ORDER — ACETAMINOPHEN 160 MG/5ML PO SOLN
1000.0000 mg | Freq: Four times a day (QID) | ORAL | Status: AC
  Administered 2013-10-10 (×2): 1000 mg
  Filled 2013-10-09: qty 40.6

## 2013-10-09 MED ORDER — ACETAMINOPHEN 500 MG PO TABS
1000.0000 mg | ORAL_TABLET | Freq: Four times a day (QID) | ORAL | Status: AC
  Administered 2013-10-10 – 2013-10-14 (×13): 1000 mg via ORAL
  Filled 2013-10-09 (×18): qty 2

## 2013-10-09 MED ORDER — SODIUM CHLORIDE 0.9 % IV SOLN
INTRAVENOUS | Status: DC
  Administered 2013-10-09 – 2013-10-10 (×2): via INTRAVENOUS

## 2013-10-09 MED ORDER — LACTATED RINGERS IV SOLN
INTRAVENOUS | Status: DC
  Administered 2013-10-09: 16:00:00 via INTRAVENOUS

## 2013-10-09 MED ORDER — MORPHINE SULFATE 2 MG/ML IJ SOLN: 2.0000 mg | INTRAMUSCULAR | Status: DC | PRN

## 2013-10-09 MED ORDER — SODIUM CHLORIDE 0.9 % IJ SOLN
10.0000 mL | INTRAMUSCULAR | Status: DC | PRN
Start: 2013-10-09 — End: 2013-10-15

## 2013-10-09 MED ORDER — METOPROLOL TARTRATE 1 MG/ML IV SOLN
2.5000 mg | INTRAVENOUS | Status: DC | PRN
  Administered 2013-10-11: 5 mg via INTRAVENOUS
  Filled 2013-10-09: qty 5

## 2013-10-09 MED ORDER — MILRINONE IN DEXTROSE 20 MG/100ML IV SOLN
0.2000 ug/kg/min | INTRAVENOUS | Status: DC
  Administered 2013-10-09: 0.3 ug/kg/min via INTRAVENOUS
  Administered 2013-10-10 – 2013-10-11 (×2): 0.2 ug/kg/min via INTRAVENOUS
  Filled 2013-10-09 (×3): qty 100

## 2013-10-09 MED ORDER — ACETAMINOPHEN 650 MG RE SUPP
650.0000 mg | Freq: Once | RECTAL | Status: AC
  Administered 2013-10-09: 650 mg via RECTAL

## 2013-10-09 MED ORDER — ALBUMIN HUMAN 5 % IV SOLN
250.0000 mL | INTRAVENOUS | Status: AC | PRN
  Administered 2013-10-09 (×4): 250 mL via INTRAVENOUS
  Filled 2013-10-09 (×2): qty 250

## 2013-10-09 MED ORDER — METOPROLOL TARTRATE 25 MG/10 ML ORAL SUSPENSION
12.5000 mg | Freq: Two times a day (BID) | ORAL | Status: DC
  Filled 2013-10-09 (×5): qty 5

## 2013-10-09 MED ORDER — CHLORHEXIDINE GLUCONATE 0.12 % MT SOLN
15.0000 mL | Freq: Two times a day (BID) | OROMUCOSAL | Status: DC
  Administered 2013-10-09 – 2013-10-11 (×5): 15 mL via OROMUCOSAL
  Filled 2013-10-09 (×5): qty 15

## 2013-10-09 MED ORDER — VECURONIUM BROMIDE 10 MG IV SOLR
INTRAVENOUS | Status: DC | PRN
  Administered 2013-10-09: 3 mg via INTRAVENOUS
  Administered 2013-10-09 (×2): 2 mg via INTRAVENOUS
  Administered 2013-10-09: 3 mg via INTRAVENOUS

## 2013-10-09 MED ORDER — DEXTROSE 5 % IV SOLN
1.5000 g | Freq: Two times a day (BID) | INTRAVENOUS | Status: AC
  Administered 2013-10-09 – 2013-10-11 (×4): 1.5 g via INTRAVENOUS
  Filled 2013-10-09 (×4): qty 1.5

## 2013-10-09 MED ORDER — MILRINONE IN DEXTROSE 20 MG/100ML IV SOLN
INTRAVENOUS | Status: DC | PRN
  Administered 2013-10-09: .3 ug/kg/min via INTRAVENOUS

## 2013-10-09 MED ORDER — MIDAZOLAM HCL 10 MG/2ML IJ SOLN
INTRAMUSCULAR | Status: AC
  Filled 2013-10-09: qty 2

## 2013-10-09 MED ORDER — LIDOCAINE HCL (CARDIAC) 20 MG/ML IV SOLN
INTRAVENOUS | Status: DC | PRN
  Administered 2013-10-09: 50 mg via INTRAVENOUS

## 2013-10-09 MED ORDER — ARTIFICIAL TEARS OP OINT
TOPICAL_OINTMENT | OPHTHALMIC | Status: AC
  Filled 2013-10-09: qty 3.5

## 2013-10-09 MED ORDER — BISACODYL 5 MG PO TBEC
10.0000 mg | DELAYED_RELEASE_TABLET | Freq: Every day | ORAL | Status: DC
  Administered 2013-10-10 – 2013-10-14 (×5): 10 mg via ORAL
  Filled 2013-10-09 (×6): qty 2

## 2013-10-09 MED ORDER — PHENYLEPHRINE HCL 10 MG/ML IJ SOLN
0.0000 ug/min | INTRAVENOUS | Status: DC
  Administered 2013-10-09: 5 ug/min via INTRAVENOUS
  Administered 2013-10-10 – 2013-10-12 (×2): 30 ug/min via INTRAVENOUS
  Filled 2013-10-09 (×7): qty 2

## 2013-10-09 MED ORDER — VECURONIUM BROMIDE 10 MG IV SOLR
INTRAVENOUS | Status: AC
  Filled 2013-10-09: qty 20

## 2013-10-09 MED ORDER — PROPOFOL 10 MG/ML IV BOLUS
INTRAVENOUS | Status: DC | PRN
  Administered 2013-10-09: 100 mg via INTRAVENOUS

## 2013-10-09 MED ORDER — DOPAMINE-DEXTROSE 3.2-5 MG/ML-% IV SOLN: 3.0000 ug/kg/min | INTRAVENOUS | Status: DC

## 2013-10-09 MED ORDER — FAMOTIDINE IN NACL 20-0.9 MG/50ML-% IV SOLN
20.0000 mg | Freq: Two times a day (BID) | INTRAVENOUS | Status: AC
  Administered 2013-10-09 (×2): 20 mg via INTRAVENOUS
  Filled 2013-10-09: qty 50

## 2013-10-09 MED ORDER — PROTAMINE SULFATE 10 MG/ML IV SOLN
INTRAVENOUS | Status: AC
  Filled 2013-10-09: qty 25

## 2013-10-09 MED ORDER — PROPOFOL 10 MG/ML IV BOLUS
INTRAVENOUS | Status: AC
  Filled 2013-10-09: qty 20

## 2013-10-09 MED ORDER — SODIUM CHLORIDE 0.9 % IJ SOLN
3.0000 mL | INTRAMUSCULAR | Status: DC | PRN
Start: 2013-10-10 — End: 2013-10-15

## 2013-10-09 MED ORDER — 0.9 % SODIUM CHLORIDE (POUR BTL) OPTIME
TOPICAL | Status: DC | PRN
  Administered 2013-10-09: 1000 mL

## 2013-10-09 MED ORDER — FENTANYL CITRATE 0.05 MG/ML IJ SOLN
INTRAMUSCULAR | Status: DC | PRN
  Administered 2013-10-09: 150 ug via INTRAVENOUS
  Administered 2013-10-09: 100 ug via INTRAVENOUS
  Administered 2013-10-09: 250 ug via INTRAVENOUS
  Administered 2013-10-09: 150 ug via INTRAVENOUS
  Administered 2013-10-09: 100 ug via INTRAVENOUS
  Administered 2013-10-09: 25 ug via INTRAVENOUS
  Administered 2013-10-09: 225 ug via INTRAVENOUS

## 2013-10-09 MED ORDER — MORPHINE SULFATE 2 MG/ML IJ SOLN: 1.0000 mg | INTRAMUSCULAR | Status: AC | PRN

## 2013-10-09 MED ORDER — NITROGLYCERIN IN D5W 200-5 MCG/ML-% IV SOLN
0.0000 ug/min | INTRAVENOUS | Status: DC
  Administered 2013-10-09: 66.667 ug/min via INTRAVENOUS

## 2013-10-09 MED ORDER — LIDOCAINE HCL (CARDIAC) 20 MG/ML IV SOLN
INTRAVENOUS | Status: AC
  Filled 2013-10-09: qty 5

## 2013-10-09 MED ORDER — ROCURONIUM BROMIDE 100 MG/10ML IV SOLN
INTRAVENOUS | Status: DC | PRN
  Administered 2013-10-09: 20 mg via INTRAVENOUS
  Administered 2013-10-09: 50 mg via INTRAVENOUS

## 2013-10-09 MED ORDER — VANCOMYCIN HCL IN DEXTROSE 750-5 MG/150ML-% IV SOLN
750.0000 mg | Freq: Once | INTRAVENOUS | Status: AC
  Administered 2013-10-09: 750 mg via INTRAVENOUS
  Filled 2013-10-09: qty 150

## 2013-10-09 MED ORDER — HEPARIN SODIUM (PORCINE) 1000 UNIT/ML IJ SOLN
INTRAMUSCULAR | Status: DC | PRN
Start: 2013-10-09 — End: 2013-10-09
  Administered 2013-10-09: 23000 [IU] via INTRAVENOUS
  Administered 2013-10-09: 2000 [IU] via INTRAVENOUS

## 2013-10-09 MED ORDER — ARTIFICIAL TEARS OP OINT
TOPICAL_OINTMENT | OPHTHALMIC | Status: DC | PRN
  Administered 2013-10-09: 1 via OPHTHALMIC

## 2013-10-09 MED ORDER — DOPAMINE-DEXTROSE 3.2-5 MG/ML-% IV SOLN
INTRAVENOUS | Status: DC | PRN
  Administered 2013-10-09: 3 ug/kg/min via INTRAVENOUS

## 2013-10-09 MED ORDER — MAGNESIUM SULFATE 4000MG/100ML IJ SOLN
4.0000 g | Freq: Once | INTRAMUSCULAR | Status: AC
  Administered 2013-10-09: 4 g via INTRAVENOUS
  Filled 2013-10-09: qty 100

## 2013-10-09 MED ORDER — DOCUSATE SODIUM 100 MG PO CAPS
200.0000 mg | ORAL_CAPSULE | Freq: Every day | ORAL | Status: DC
  Administered 2013-10-10 – 2013-10-14 (×5): 200 mg via ORAL
  Filled 2013-10-09 (×7): qty 2

## 2013-10-09 MED ORDER — MIDAZOLAM HCL 2 MG/2ML IJ SOLN: 2.0000 mg | INTRAMUSCULAR | Status: DC | PRN

## 2013-10-09 MED ORDER — HEMOSTATIC AGENTS (NO CHARGE) OPTIME
TOPICAL | Status: DC | PRN
  Administered 2013-10-09: 1 via TOPICAL

## 2013-10-09 MED ORDER — ACETAMINOPHEN 160 MG/5ML PO SOLN: 650.0000 mg | Freq: Once | ORAL | Status: AC

## 2013-10-09 MED ORDER — SODIUM CHLORIDE 0.9 % IV SOLN
INTRAVENOUS | Status: AC
  Filled 2013-10-09 (×2): qty 1

## 2013-10-09 MED ORDER — METOPROLOL TARTRATE 12.5 MG HALF TABLET
12.5000 mg | ORAL_TABLET | Freq: Two times a day (BID) | ORAL | Status: DC
  Administered 2013-10-10 (×2): 12.5 mg via ORAL
  Filled 2013-10-09 (×5): qty 1

## 2013-10-09 MED ORDER — DEXMEDETOMIDINE HCL IN NACL 200 MCG/50ML IV SOLN
0.1000 ug/kg/h | INTRAVENOUS | Status: DC
  Administered 2013-10-09: 0.6 ug/kg/h via INTRAVENOUS
  Filled 2013-10-09: qty 50

## 2013-10-09 MED ORDER — EPHEDRINE SULFATE 50 MG/ML IJ SOLN
INTRAMUSCULAR | Status: AC
  Filled 2013-10-09: qty 1

## 2013-10-09 MED ORDER — POTASSIUM CHLORIDE 10 MEQ/50ML IV SOLN
10.0000 meq | Freq: Once | INTRAVENOUS | Status: AC
  Administered 2013-10-09: 10 meq via INTRAVENOUS

## 2013-10-09 MED FILL — Mannitol IV Soln 20%: INTRAVENOUS | Qty: 500 | Status: AC

## 2013-10-09 MED FILL — Lidocaine HCl IV Inj 20 MG/ML: INTRAVENOUS | Qty: 5 | Status: AC

## 2013-10-09 MED FILL — Sodium Bicarbonate IV Soln 8.4%: INTRAVENOUS | Qty: 50 | Status: AC

## 2013-10-09 MED FILL — Heparin Sodium (Porcine) Inj 1000 Unit/ML: INTRAMUSCULAR | Qty: 10 | Status: AC

## 2013-10-09 MED FILL — Electrolyte-R (PH 7.4) Solution: INTRAVENOUS | Qty: 4000 | Status: AC

## 2013-10-09 MED FILL — Sodium Chloride IV Soln 0.9%: INTRAVENOUS | Qty: 1000 | Status: AC

## 2013-10-09 SURGICAL SUPPLY — 99 items
APL SKNCLS STERI-STRIP NONHPOA (GAUZE/BANDAGES/DRESSINGS) ×2
ATTRACTOMAT 16X20 MAGNETIC DRP (DRAPES) ×3 IMPLANT
BAG DECANTER FOR FLEXI CONT (MISCELLANEOUS) ×3 IMPLANT
BANDAGE ELASTIC 4 VELCRO ST LF (GAUZE/BANDAGES/DRESSINGS) ×3 IMPLANT
BANDAGE ELASTIC 6 VELCRO ST LF (GAUZE/BANDAGES/DRESSINGS) ×3 IMPLANT
BANDAGE GAUZE ELAST BULKY 4 IN (GAUZE/BANDAGES/DRESSINGS) ×3 IMPLANT
BASKET HEART (ORDER IN 25'S) (MISCELLANEOUS) ×1
BASKET HEART (ORDER IN 25S) (MISCELLANEOUS) ×2 IMPLANT
BENZOIN TINCTURE PRP APPL 2/3 (GAUZE/BANDAGES/DRESSINGS) ×3 IMPLANT
BLADE 11 SAFETY STRL DISP (BLADE) ×3 IMPLANT
BLADE STERNUM SYSTEM 6 (BLADE) ×3 IMPLANT
BNDG GAUZE ELAST 4 BULKY (GAUZE/BANDAGES/DRESSINGS) ×3 IMPLANT
CANISTER SUCTION 2500CC (MISCELLANEOUS) ×3 IMPLANT
CANNULA EZ GLIDE AORTIC 21FR (CANNULA) ×3 IMPLANT
CARDIAC SUCTION (MISCELLANEOUS) ×3 IMPLANT
CATH CPB KIT HENDRICKSON (MISCELLANEOUS) ×3 IMPLANT
CATH ROBINSON RED A/P 18FR (CATHETERS) ×3 IMPLANT
CATH THORACIC 36FR (CATHETERS) ×3 IMPLANT
CATH THORACIC 36FR RT ANG (CATHETERS) ×3 IMPLANT
CLIP TI MEDIUM 24 (CLIP) IMPLANT
CLIP TI WIDE RED SMALL 24 (CLIP) IMPLANT
CLSR STERI-STRIP ANTIMIC 1/2X4 (GAUZE/BANDAGES/DRESSINGS) ×3 IMPLANT
COVER SURGICAL LIGHT HANDLE (MISCELLANEOUS) ×3 IMPLANT
CRADLE DONUT ADULT HEAD (MISCELLANEOUS) ×3 IMPLANT
DRAPE CARDIOVASCULAR INCISE (DRAPES) ×2
DRAPE SLUSH/WARMER DISC (DRAPES) ×3 IMPLANT
DRAPE SRG 135X102X78XABS (DRAPES) ×2 IMPLANT
DRSG COVADERM 4X14 (GAUZE/BANDAGES/DRESSINGS) ×3 IMPLANT
ELECT REM PT RETURN 9FT ADLT (ELECTROSURGICAL) ×6
ELECTRODE REM PT RTRN 9FT ADLT (ELECTROSURGICAL) ×4 IMPLANT
GLOVE BIO SURGEON STRL SZ 6 (GLOVE) ×9 IMPLANT
GLOVE BIO SURGEON STRL SZ 6.5 (GLOVE) ×12 IMPLANT
GLOVE BIO SURGEON STRL SZ8.5 (GLOVE) ×3 IMPLANT
GLOVE BIOGEL PI IND STRL 6 (GLOVE) ×16 IMPLANT
GLOVE BIOGEL PI IND STRL 6.5 (GLOVE) ×6 IMPLANT
GLOVE BIOGEL PI IND STRL 7.0 (GLOVE) ×6 IMPLANT
GLOVE BIOGEL PI INDICATOR 6 (GLOVE) ×8
GLOVE BIOGEL PI INDICATOR 6.5 (GLOVE) ×3
GLOVE BIOGEL PI INDICATOR 7.0 (GLOVE) ×3
GLOVE EUDERMIC 7 POWDERFREE (GLOVE) ×9 IMPLANT
GOWN STRL REUS W/ TWL LRG LVL3 (GOWN DISPOSABLE) ×12 IMPLANT
GOWN STRL REUS W/ TWL XL LVL3 (GOWN DISPOSABLE) ×4 IMPLANT
GOWN STRL REUS W/TWL LRG LVL3 (GOWN DISPOSABLE) ×12
GOWN STRL REUS W/TWL XL LVL3 (GOWN DISPOSABLE) ×4
HEMOSTAT POWDER SURGIFOAM 1G (HEMOSTASIS) ×9 IMPLANT
HEMOSTAT SURGICEL 2X14 (HEMOSTASIS) ×3 IMPLANT
INSERT FOGARTY XLG (MISCELLANEOUS) IMPLANT
KIT BASIN OR (CUSTOM PROCEDURE TRAY) ×3 IMPLANT
KIT ROOM TURNOVER OR (KITS) ×3 IMPLANT
KIT SUCTION CATH 14FR (SUCTIONS) ×6 IMPLANT
KIT VASOVIEW W/TROCAR VH 2000 (KITS) ×3 IMPLANT
MARKER GRAFT CORONARY BYPASS (MISCELLANEOUS) ×9 IMPLANT
NS IRRIG 1000ML POUR BTL (IV SOLUTION) ×15 IMPLANT
PACK OPEN HEART (CUSTOM PROCEDURE TRAY) ×3 IMPLANT
PAD ARMBOARD 7.5X6 YLW CONV (MISCELLANEOUS) ×6 IMPLANT
PAD ELECT DEFIB RADIOL ZOLL (MISCELLANEOUS) ×3 IMPLANT
PENCIL BUTTON HOLSTER BLD 10FT (ELECTRODE) ×3 IMPLANT
PUNCH AORTIC ROTATE  4.5MM 8IN (MISCELLANEOUS) ×3 IMPLANT
PUNCH AORTIC ROTATE 4.0MM (MISCELLANEOUS) IMPLANT
PUNCH AORTIC ROTATE 4.5MM 8IN (MISCELLANEOUS) IMPLANT
PUNCH AORTIC ROTATE 5MM 8IN (MISCELLANEOUS) IMPLANT
RELOAD ENDO GIA 45X2.5 (ENDOMECHANICALS) ×6 IMPLANT
SET CARDIOPLEGIA MPS 5001102 (MISCELLANEOUS) ×3 IMPLANT
SPONGE GAUZE 4X4 12PLY (GAUZE/BANDAGES/DRESSINGS) ×6 IMPLANT
SPONGE GAUZE 4X4 12PLY STER LF (GAUZE/BANDAGES/DRESSINGS) ×6 IMPLANT
STRIP CLOSURE SKIN 1/2X4 (GAUZE/BANDAGES/DRESSINGS) ×3 IMPLANT
STRIP CLOSURE SKIN 1/4X4 (GAUZE/BANDAGES/DRESSINGS) IMPLANT
SUT BONE WAX W31G (SUTURE) ×3 IMPLANT
SUT MNCRL AB 4-0 PS2 18 (SUTURE) ×3 IMPLANT
SUT PROLENE 3 0 SH DA (SUTURE) ×3 IMPLANT
SUT PROLENE 4 0 RB 1 (SUTURE) ×2
SUT PROLENE 4 0 SH DA (SUTURE) ×3 IMPLANT
SUT PROLENE 4-0 RB1 .5 CRCL 36 (SUTURE) ×2 IMPLANT
SUT PROLENE 5 0 C 1 36 (SUTURE) ×3 IMPLANT
SUT PROLENE 6 0 C 1 30 (SUTURE) ×6 IMPLANT
SUT PROLENE 7 0 BV 1 (SUTURE) ×12 IMPLANT
SUT PROLENE 7 0 BV1 MDA (SUTURE) ×3 IMPLANT
SUT PROLENE 8 0 BV175 6 (SUTURE) IMPLANT
SUT STEEL 6MS V (SUTURE) ×3 IMPLANT
SUT STEEL STERNAL CCS#1 18IN (SUTURE) IMPLANT
SUT STEEL SZ 6 DBL 3X14 BALL (SUTURE) ×3 IMPLANT
SUT VIC AB 1 CTX 36 (SUTURE) ×6
SUT VIC AB 1 CTX36XBRD ANBCTR (SUTURE) ×4 IMPLANT
SUT VIC AB 2-0 CT1 27 (SUTURE) ×1
SUT VIC AB 2-0 CT1 TAPERPNT 27 (SUTURE) ×2 IMPLANT
SUT VIC AB 2-0 CTX 27 (SUTURE) IMPLANT
SUT VIC AB 3-0 SH 27 (SUTURE)
SUT VIC AB 3-0 SH 27X BRD (SUTURE) IMPLANT
SUT VIC AB 3-0 X1 27 (SUTURE) IMPLANT
SUT VICRYL 4-0 PS2 18IN ABS (SUTURE) IMPLANT
SUTURE E-PAK OPEN HEART (SUTURE) ×3 IMPLANT
SYSTEM SAHARA CHEST DRAIN ATS (WOUND CARE) ×3 IMPLANT
TOWEL OR 17X24 6PK STRL BLUE (TOWEL DISPOSABLE) ×6 IMPLANT
TOWEL OR 17X26 10 PK STRL BLUE (TOWEL DISPOSABLE) ×6 IMPLANT
TRAY FOLEY IC TEMP SENS 16FR (CATHETERS) ×3 IMPLANT
TUBE FEEDING 8FR 16IN STR KANG (MISCELLANEOUS) ×3 IMPLANT
TUBING INSUFFLATION 10FT LAP (TUBING) ×3 IMPLANT
UNDERPAD 30X30 INCONTINENT (UNDERPADS AND DIAPERS) ×3 IMPLANT
WATER STERILE IRR 1000ML POUR (IV SOLUTION) ×6 IMPLANT

## 2013-10-09 NOTE — Progress Notes (Signed)
INITIAL NUTRITION ASSESSMENT  DOCUMENTATION CODES Per approved criteria  -Not Applicable   INTERVENTION:  If TF started, recommend Vital AF 1.2 formula -- initiate at 15 ml/hr and increase by 10 ml every 4 hours to goal rate of 35 ml/hr with Prostat liquid protein 30 ml TID to provide 1308 kcals, 108 gm protein, 681 ml of free water RD to follow for nutrition care plan  NUTRITION DIAGNOSIS: Inadequate oral intake related to inability to eat as evidenced by NPO status  Goal: Initiation of nutrition support in next 24-48 hours if prolonged intubation expected  Monitor:  TF initiation & tolerance, respiratory status, weight, labs, I/O's  Reason for Assessment: Malnutrition Screening Tool Report  80 y.o. female  Admitting Dx: Acute on chronic combined systolic and diastolic CHF, NYHA class 3  ASSESSMENT: 80 yo Female with PMH of HTN, DM, HLD; s/p cardiac cath 7/28 which showed severe three-vessel CAD with chronic RCA occlusion with collateralization from the LAD and circumflex.  Patient s/p procedure 7/31: CORONARY ARTERY BYPASS GRAFTING x 4   Patient is currently intubated on ventilator support MV:  L/min Temp (24hrs), Avg:96.8 F (36 C), Min:96.4 F (35.8 C), Max:97.7 F (36.5 C)   RD unable to obtain nutrition hx from pt at this time.  Per admission nutrition screen, pt reported recent weight loss without trying and eating poorly because of a decreased appetite.    No muscle or subcutaneous fat depletion noticed.  Height: Ht Readings from Last 1 Encounters:  10/08/13 5' 6.5" (1.689 m)    Weight: Wt Readings from Last 1 Encounters:  10/09/13 150 lb 8 oz (68.266 kg)    Ideal Body Weight: 130 lb  % Ideal Body Weight: 115%  Wt Readings from Last 10 Encounters:  10/09/13 150 lb 8 oz (68.266 kg)  10/09/13 150 lb 8 oz (68.266 kg)  10/09/13 150 lb 8 oz (68.266 kg)  10/05/13 154 lb (69.854 kg)    Usual Body Weight: 154 lb  % Usual Body Weight: 97%  BMI:   Body mass index is 23.93 kg/(m^2).  Estimated Nutritional Needs: Kcal: 1200-1350 Protein: 100-110 gm Fluid: per MD  Skin: chest surgical incision   Diet Order: NPO  EDUCATION NEEDS: -No education needs identified at this time   Intake/Output Summary (Last 24 hours) at 10/09/13 1333 Last data filed at 10/09/13 1230  Gross per 24 hour  Intake   3345 ml  Output   2970 ml  Net    375 ml    Labs:   Recent Labs Lab 10/07/13 0350 10/07/13 1935 10/08/13 0538  10/09/13 0935 10/09/13 1037 10/09/13 1130 10/09/13 1239  NA 141 137 142  < > 133* 134* 133* 137  K 4.5 4.1 4.3  < > 3.4* 4.4 4.2 3.7  CL 95* 93* 97  < > 92* 97 97  --   CO2 29 24 30   --   --   --   --   --   BUN 24* 28* 26*  < > 19 20 19   --   CREATININE 0.99 0.93 0.89  < > 0.60 0.60 0.50  --   CALCIUM 9.8 9.9 9.7  --   --   --   --   --   GLUCOSE 119* 196* 145*  < > 125* 133* 155* 122*  < > = values in this interval not displayed.  CBG (last 3)   Recent Labs  10/08/13 1657 10/08/13 2113 10/09/13 0610  GLUCAP 149* 236* 178*  Scheduled Meds: . [START ON 10/10/2013] acetaminophen  1,000 mg Oral 4 times per day   Or  . [START ON 10/10/2013] acetaminophen (TYLENOL) oral liquid 160 mg/5 mL  1,000 mg Per Tube 4 times per day  . acetaminophen (TYLENOL) oral liquid 160 mg/5 mL  650 mg Per Tube Once   Or  . acetaminophen  650 mg Rectal Once  . [START ON 10/10/2013] aspirin EC  325 mg Oral Daily   Or  . [START ON 10/10/2013] aspirin  324 mg Per Tube Daily  . atorvastatin  80 mg Oral Daily  . [START ON 10/10/2013] bisacodyl  10 mg Oral Daily   Or  . [START ON 10/10/2013] bisacodyl  10 mg Rectal Daily  . cefUROXime (ZINACEF)  IV  1.5 g Intravenous Q12H  . [START ON 10/10/2013] docusate sodium  200 mg Oral Daily  . famotidine (PEPCID) IV  20 mg Intravenous Q12H  . insulin regular  0-10 Units Intravenous TID WC  . levothyroxine  112 mcg Oral QAC breakfast  . magnesium sulfate  4 g Intravenous Once  . metoprolol tartrate   12.5 mg Oral BID   Or  . metoprolol tartrate  12.5 mg Per Tube BID  . oxybutynin  30 mg Oral QHS  . [START ON 10/11/2013] pantoprazole  40 mg Oral Daily  . potassium chloride  10 mEq Intravenous Q1 Hr x 3  . potassium chloride  10 mEq Intravenous Once  . sodium chloride  10 mL Intravenous Q12H  . [START ON 10/10/2013] sodium chloride  3 mL Intravenous Q12H  . Vancomycin  750 mg Intravenous Once    Continuous Infusions: . sodium chloride 20 mL/hr at 10/09/13 1303  . sodium chloride    . [START ON 10/10/2013] sodium chloride    . dexmedetomidine    . insulin (NOVOLIN-R) infusion 3.1 Units/hr (10/09/13 1304)  . lactated ringers    . nitroGLYCERIN    . phenylephrine (NEO-SYNEPHRINE) Adult infusion    . phenylephrine (NEO-SYNEPHRINE) Adult infusion      Past Medical History  Diagnosis Date  . DM2 (diabetes mellitus, type 2)   . Hypertension   . Hyperlipidemia   . GERD (gastroesophageal reflux disease)   . Osteopenia   . CAD (coronary artery disease), native coronary artery 10/06/2013    LAD 90%, CFX 90%, RCA 100% w/ collat  . Ischemic dilated cardiomyopathy 09/2013    EF 20-25% by echo  . Chronic combined systolic and diastolic CHF, NYHA class 2 09/2013  . NSVT (nonsustained ventricular tachycardia) 09/2013  . OAB (overactive bladder)   . Hypothyroidism   . CVA (cerebral vascular accident) 1968    leaving no deficit  . Arthritis     "knees" (10/08/2013)  . Breast cancer     "left; I had 62 sessions of radiation"    Past Surgical History  Procedure Laterality Date  . Colonoscopy  2005  . Cardiac catheterization  10/06/2013  . Cataract extraction w/ intraocular lens  implant, bilateral Bilateral ~ 2009  . Tonsillectomy and adenoidectomy  1930's  . Breast lumpectomy Left 1998  . Breast lumpectomy with axillary lymph node dissection Left 1998    Arthur Holms, RD, LDN Pager #: [phone] After-Hours Pager #: [phone]

## 2013-10-09 NOTE — Anesthesia Postprocedure Evaluation (Signed)
  Anesthesia Post-op Note  Patient: Marissa Jefferson  Procedure(s) Performed: Procedure(s): CORONARY ARTERY BYPASS GRAFTING (CABG) x 4 using left internal mammary artery and right saphenous leg vein using endoscope. ([name]) INTRAOPERATIVE TRANSESOPHAGEAL ECHOCARDIOGRAM ([name])  Patient Location: ICU  Anesthesia Type:General  Level of Consciousness: sedated  Airway and Oxygen Therapy: Patient remains intubated per anesthesia plan  Post-op Pain: none  Post-op Assessment: Post-op Vital signs reviewed, Patient's Cardiovascular Status Stable and Respiratory Function Stable  Post-op Vital Signs: Reviewed and stable  Last Vitals:  Filed Vitals:   10/09/13 1330  BP: 105/63  Pulse: 75  Temp: 35.8 C  Resp: 12    Complications: None

## 2013-10-09 NOTE — Progress Notes (Signed)
Intubated  Starting to wake up, vent wean in progress  BP 91/49  Pulse 90  Temp(Src) 98.6 F (37 C) (Oral)  Resp 10  Ht 5' 6.5" (1.689 m)  Wt 150 lb 8 oz (68.266 kg)  BMI 23.93 kg/m2  SpO2 95%   Intake/Output Summary (Last 24 hours) at 10/09/13 1845 Last data filed at 10/09/13 1800  Gross per 24 hour  Intake 4988.32 ml  Output   4785 ml  Net 203.32 ml    CI= 1.7, PAD 15-17- needs more preload given LV dilatation  overall doing well

## 2013-10-09 NOTE — Progress Notes (Signed)
10/09/13 1315  Vitals  Temp ! 96.4 F (35.8 C)  BP 111/64 mmHg  MAP (mmHg) 75  Pulse Rate 77  ECG Heart Rate 77  Resp 12  Oxygen Therapy  SpO2 99 %  Art Line  Arterial Line BP 132/68 mmHg  Arterial Line MAP (mmHg) 83 mmHg  Invasive Hemodynamic Monitoring  PAP 32/23 mmHg  PAP (Mean) 26 mmHg  CO (L/min) 2.5 L/min  CI (L/min/m2) 1.4 L/min/m2  Albumin 5% 250 ml IV given

## 2013-10-09 NOTE — Anesthesia Preprocedure Evaluation (Addendum)
Anesthesia Evaluation  Patient identified by MRN, date of birth, ID band Patient awake    Reviewed: Allergy & Precautions, H&P , NPO status , Patient's Chart, lab work & pertinent test results  Airway Mallampati: II TM Distance: >3 FB Neck ROM: full    Dental  (+) Dental Advisory Given, Teeth Intact   Pulmonary former smoker,          Cardiovascular hypertension, + angina + CAD and +CHF     Neuro/Psych CVA    GI/Hepatic GERD-  ,  Endo/Other  diabetes, Type 2Hypothyroidism   Renal/GU      Musculoskeletal  (+) Arthritis -,   Abdominal   Peds  Hematology   Anesthesia Other Findings   Reproductive/Obstetrics                          Anesthesia Physical Anesthesia Plan  ASA: III  Anesthesia Plan: General   Post-op Pain Management:    Induction: Intravenous  Airway Management Planned: Oral ETT  Additional Equipment: Arterial line, CVP, PA Cath, TEE and Ultrasound Guidance Line Placement  Intra-op Plan:   Post-operative Plan: Post-operative intubation/ventilation  Informed Consent: I have reviewed the patients History and Physical, chart, labs and discussed the procedure including the risks, benefits and alternatives for the proposed anesthesia with the patient or authorized representative who has indicated his/her understanding and acceptance.     Plan Discussed with: CRNA, Anesthesiologist and Surgeon  Anesthesia Plan Comments:         Anesthesia Quick Evaluation

## 2013-10-09 NOTE — Brief Op Note (Addendum)
10/06/2013 - 10/09/2013  10:49 AM  PATIENT:  Marissa Jefferson  80 y.o. female  PRE-OPERATIVE DIAGNOSIS:  CAD  POST-OPERATIVE DIAGNOSIS:  CAD  PROCEDURE:   CORONARY ARTERY BYPASS GRAFTING x 4 (LIMA-LAD, SVG-OM1-OM2, SVG-PD)) ENDOSCOPIC VEIN HARVEST RIGHT LEG  SURGEON:  Melrose Nakayama, MD  ASSISTANT: Suzzanne Cloud, PA-C  ANESTHESIA:   general  PATIENT CONDITION:  ICU - intubated and hemodynamically stable.  PRE-OPERATIVE WEIGHT: 68 kg  XC= 69 min CPB= 98 min  Good quality targets and conduits

## 2013-10-09 NOTE — H&P (View-Only) (Signed)
2 Days Post-Op Procedure(s) (LRB): LEFT AND RIGHT HEART CATHETERIZATION WITH CORONARY ANGIOGRAM ([name]) Subjective: No CP or SOB She is ready to proceed with CABG  Objective: Vital signs in last 24 hours: Temp:  [97.4 F (36.3 C)-97.9 F (36.6 C)] 97.4 F (36.3 C) (07/30 1500) Pulse Rate:  [81-93] 82 (07/30 1500) Cardiac Rhythm:  [-] Normal sinus rhythm;Sinus tachycardia (07/29 2000) Resp:  [18] 18 (07/30 1500) BP: (95-118)/(58-82) 95/58 mmHg (07/30 1500) SpO2:  [93 %-98 %] 98 % (07/30 1500) Weight:  [148 lb 14.4 oz (67.541 kg)] 148 lb 14.4 oz (67.541 kg) (07/30 0536)  Hemodynamic parameters for last 24 hours:    Intake/Output from previous day: 07/29 0701 - 07/30 0700 In: 120 [P.O.:120] Out: 300 [Urine:300] Intake/Output this shift: Total I/O In: 360 [P.O.:360] Out: -   General appearance: alert and no distress Neurologic: intact Heart: regular rate and rhythm Lungs: clear to auscultation bilaterally  Lab Results:  Recent Labs  10/08/13 0538  WBC 9.6  HGB 13.1  HCT 39.5  PLT 250   BMET:  Recent Labs  10/07/13 1935 10/08/13 0538  NA 137 142  K 4.1 4.3  CL 93* 97  CO2 24 30  GLUCOSE 196* 145*  BUN 28* 26*  CREATININE 0.93 0.89  CALCIUM 9.9 9.7    PT/INR:  Recent Labs  10/07/13 1935  LABPROT 13.5  INR 1.03   ABG    Component Value Date/Time   PHART 7.468* 10/07/2013 1830   HCO3 25.9* 10/07/2013 1830   TCO2 27.0 10/07/2013 1830   ACIDBASEDEF 1.0 10/06/2013 1621   O2SAT 94.8 10/07/2013 1830   CBG (last 3)   Recent Labs  10/08/13 0629 10/08/13 1138 10/08/13 1657  GLUCAP 149* 165* 149*    Assessment/Plan: S/P Procedure(s) (LRB): LEFT AND RIGHT HEART CATHETERIZATION WITH CORONARY ANGIOGRAM ([name]) - Severe 3 vessel CAD with impaired LV function(EF 20%) Needs CABG for survival benefit and relief of symptoms.  She is aware of the risks and benefits and wishes to proceed For OR in AM All questions answered   LOS: 2 days     HENDRICKSON,STEVEN C 10/08/2013

## 2013-10-09 NOTE — Interval H&P Note (Signed)
History and Physical Interval Note:  10/09/2013 7:21 AM  Marissa Jefferson  has presented today for surgery, with the diagnosis of CAD  The various methods of treatment have been discussed with the patient and family. After consideration of risks, benefits and other options for treatment, the patient has consented to  Procedure(s) with comments: CORONARY ARTERY BYPASS GRAFTING (CABG) ([name]) - CABG W/ EVH INTRAOPERATIVE TRANSESOPHAGEAL ECHOCARDIOGRAM ([name]) as a surgical intervention .  The patient's history has been reviewed, patient examined, no change in status, stable for surgery.  I have reviewed the patient's chart and labs.  Questions were answered to the patient's satisfaction.     [name]

## 2013-10-09 NOTE — Op Note (Signed)
NAMEKEYLEEN, CERRATO               ACCOUNT NO.:  [id number]  MEDICAL RECORD NO.:  [id number]  LOCATION:  2S08C                        FACILITY:  [facility]  PHYSICIAN:  Revonda Standard. Roxan Hockey, M.D.DATE OF BIRTH:  1924-04-18  DATE OF PROCEDURE:  10/09/2013 DATE OF DISCHARGE:                              OPERATIVE REPORT   PREOPERATIVE DIAGNOSIS:  Severe three-vessel disease with ischemic cardiomyopathy and congestive heart failure.  POSTOPERATIVE DIAGNOSIS:  Severe three-vessel disease with ischemic cardiomyopathy and congestive heart failure.  PROCEDURE:  Median sternotomy, extracorporeal circulation, coronary artery bypass grafting x4 (left internal mammary artery to left anterior descending, saphenous vein graft to posterior descending, sequential saphenous vein graft to obtuse marginals 1 and 2), endoscopic vein harvest. right leg.  SURGEON:  Revonda Standard. Roxan Hockey, M.D.  ASSISTANTS:  Suzzanne Cloud, P.A.  ANESTHESIA:  General.  FINDINGS:  Transesophageal echocardiography revealed severe left ventricular dysfunction with ejection fraction of 20% to 25%.  There was mild mitral regurgitation.  Good quality target vessels and good quality conduits.  Postbypass transesophageal echocardiography initially revealed some posterior hypokinesis which improved with time. Overall slightly improved left ventricular function on milrinone and dopamine.  CLINICAL NOTE:  Ms. Obando is an 80 year old woman who presented with congestive heart failure symptoms.  Her evaluation included an echocardiogram, which showed severe left ventricular dysfunction with ejection fraction of 20%-25%.  There was mild-to-moderate mitral regurgitation.  She underwent cardiac catheterization where she was found to have severe three-vessel coronary disease, not amenable to percutaneous intervention.  Her pulmonary artery pressures were normal and mixed venous saturation was 70%.  Her cardiac index was greater than 2  liters/min/m2.  The patient was not felt to be a candidate for percutaneous intervention, but was felt to be a candidate for bypass grafting, although at high risk due to her advanced age and severe aortic arch atherosclerotic disease.  The indications, risks, benefits, and alternatives were discussed in detail with the patient.  She understood and accepted the risks and agreed to proceed.  OPERATIVE NOTE:  Ms. Koning was brought to the operating room on October 09, 2013. In the preoperative holding area, Anesthesia placed a Swan-Ganz catheter and an arterial blood pressure monitoring line.  She was taken to the operating room, anesthetized, and intubated.  Intravenous antibiotics were administered.  Transesophageal echocardiography was performed.  It revealed global hypokinesis with ejection fraction of 20%- 25%.  There was 1+ mitral regurgitation.  A Foley catheter was placed. The chest, abdomen, and legs were prepped and draped in usual sterile fashion.    An incision was made in the medial aspect of the right leg at the level of the knee.  The greater saphenous vein was identified and was harvested endoscopically from groin to mid calf.  The vein was of good quality.  2000 units of heparin was administered during the vessel harvest.  Simultaneously with the vein harvest, a median sternotomy was performed and the left internal mammary artery was harvested using standard technique.  This was a 1.5 mm good quality conduit, and had excellent flow when divided distally.  After harvesting the conduits, the remainder the heparin was given.  The pericardium was opened.  The ascending aorta  was inspected.  There was no palpable atherosclerotic disease in the ascending aorta.  There was some palpable in the proximal arch just beyond the aortic cannulation site, but none at the aortic cannulation site itself .  After confirming anticoagulation with ACT measurement, the aorta was cannulated  via concentric 2-0 Ethibond pledgeted pursestring sutures.  A dual-stage venous cannula was placed via pursestring suture in the right atrial appendage.  Cardiopulmonary bypass was instituted.  Flows were maintained per protocol.  The patient was cooled to 32 degrees Celsius. The coronary arteries were inspected and anastomotic sites were chosen. The conduits were inspected and cut to length.  A foam pad was placed the pericardium to insulate the heart and protect the left phrenic nerve.  A temperature probe was placed in myocardial septum and a cardioplegia cannula was placed in the ascending aorta.  The aorta was crossclamped.  The left ventricle was emptied via the aortic root vent.  Cardiac arrest then was achieved with a combination of cold antegrade blood cardioplegia and topical iced saline. 1.5 L of cardioplegia was administered.  There was myocardial septal cooling to 10 degrees Celsius.  There was a rapid diastolic arrest.  The following distal anastomoses were performed.  First, a reversed saphenous vein graft was placed end-to-side to the posterior descending branch of the right coronary, this was a 1.5 mm good quality target vessel. The vein was of good quality.  The end-to- side anastomosis was performed with a running 7-0 Prolene suture.  All anastomoses were probed proximally and distally at their completion to ensure patency before tying the suture.  Cardioplegia was administered down each vein graft at its completion to assess flow and hemostasis, both were good.  Next, a reversed saphenous vein graft placed sequentially to the first and second obtuse marginal branches of the left circumflex.  These were both 1.5 mm vessels.  OM1 was superficially intramyocardial.  A side-to- side anastomosis was performed to OM1 and end-to-side to OM2. Both were done with running 7-0 Prolene sutures.  There was excellent flow through the graft and good hemostasis at both  anastomoses.  Additional cardioplegia was administered down the aortic root.  Next, the left internal mammary artery was brought through a window in the pericardium.  The distal end was beveled and anastomosed end-to-side to the distal LAD.  The LAD was a 1.5 mm good quality target at the site of anastomosis.  It did have significant calcified plaque proximally, but the probe passed proximally. It passed easily distally to the apex.  The mammary to LAD anastomosis was performed with a running 8-0 Prolene suture.  At completion of the anastomosis, the bulldog clamp was removed. There was good hemostasis.  There was rapid septal rewarming.  The bulldog clamp was replaced and the mammary pedicle was tacked to the epicardial surface of the heart with 6-0 Prolene sutures.  Additional cardioplegia was administered.  The vein grafts were cut to length.  The cardioplegia cannula was removed from the ascending aorta. The proximal vein graft anastomoses then were performed to 4.5 mm punch aortotomies with running 6-0 Prolene sutures.  At the completion of the final proximal anastomosis, the patient was placed in Trendelenburg position.  Lidocaine was administered.  The bulldog clamp was again removed with the left mammary artery.  The aortic root was de-aired and the aortic crossclamp was removed.  Total crossclamp time was 69 minutes.  The patient spontaneously resumed sinus rhythm and did not require defibrillation.  While rewarming  was completed, all proximal and distal anastomoses were inspected for hemostasis.  Epicardial pacing wires were placed on the right ventricle and right atrium.  The patient was in sinus rhythm and did not require pacing. When she had rewarmed to a core temperature of 37 degrees Celsius, she was weaned from cardiopulmonary bypass on a combination of dopamine at 3 mcg/kg per minute and milrinone at 0.3 mcg/kg/minute.  The initial cardiac index was less than 2, but  with administration of volume, the cardiac index was greater than 2 liters/min./m2 and she remained hemodynamically stable throughout the postbypass period.  Postbypass transesophageal echocardiography initially revealed some inferior posterior wall hypokinesis but that improved with time.  Overall, left ventricular function was improved, albeit on dopamine and milrinone.  A test dose protamine was administered and was well tolerated.  The atrial and aortic cannulae were removed.  The remainder of protamine was administered without incident.  The chest was irrigated with warm saline. Hemostasis was achieved.  The left pleural and mediastinal chest tubes were placed via separate subcostal incisions.  The sternum was closed with interrupted heavy gauge stainless steel wires.  The pectoralis fascia, subcutaneous tissue, and skin were closed in standard fashion.  All sponge, needle, and instrument counts were correct at the end of the procedure.  The patient was taken from the operating room to the surgical intensive care unit in good condition.     Revonda Standard Roxan Hockey, M.D.     SCH/MEDQ  D:  10/09/2013  T:  10/09/2013  Job:  [id number]

## 2013-10-09 NOTE — Progress Notes (Signed)
Patient is off of precedex, following commands but refuses to perform breathing tests for RT. She expresses that she doesn't want it to hurt. RN explained to her that she will receive pain medication afterwards but it could potentially delay extubation if given before. She continues to express the need for the tube to be removed. Daughter also at bedside to help patient, but unsuccessful. Relayed to HS RN and RT as they will attempt later if ready. Richardean Sale, RN

## 2013-10-09 NOTE — Progress Notes (Signed)
  Echocardiogram Echocardiogram Transesophageal has been performed.  Marissa Jefferson 10/09/2013, 8:13 AM

## 2013-10-09 NOTE — Anesthesia Procedure Notes (Signed)
Procedure Name: Intubation Date/Time: 10/09/2013 7:41 AM Performed by: Gaylene Brooks Pre-anesthesia Checklist: Patient identified, Timeout performed, Emergency Drugs available, Suction available and Patient being monitored Patient Re-evaluated:Patient Re-evaluated prior to inductionOxygen Delivery Method: Circle system utilized Preoxygenation: Pre-oxygenation with 100% oxygen Intubation Type: IV induction Ventilation: Mask ventilation without difficulty Laryngoscope Size: Mac and 3 Grade View: Grade II Tube type: Oral Tube size: 8.0 mm Number of attempts: 2 Airway Equipment and Method: Stylet Placement Confirmation: breath sounds checked- equal and bilateral,  ETT inserted through vocal cords under direct vision,  positive ETCO2 and CO2 detector Secured at: 22 cm Tube secured with: Tape Dental Injury: Teeth and Oropharynx as per pre-operative assessment

## 2013-10-09 NOTE — Progress Notes (Signed)
Second attempt at rapid vent wean unsuccessful.  Pt's blood gas WDL.  Pt following commands but unable to keep eyes open and perform NIF or VC exercises.  Numerous attempts by RT and RN to explain how to perform exercise with no success.  Pt's daughter at bedside for encouragement.  Pt nods head "yes" when asked if she is sleepy.  Will re-evaluate later for readiness to extubate.  Will continue to monitor closely.

## 2013-10-09 NOTE — Progress Notes (Addendum)
10/09/13 1415  Vitals  Temp ! 96.8 F (36 C)  BP ! 92/56 mmHg  MAP (mmHg) 62  Pulse Rate 71  ECG Heart Rate 72  Resp ! 8  Oxygen Therapy  SpO2 98 %  Art Line  Arterial Line BP 107/57 mmHg  Arterial Line MAP (mmHg) 69 mmHg  Invasive Hemodynamic Monitoring  PAP 30/24 mmHg  PAP (Mean) 25 mmHg  3rd bottle albumin 5% 250 hung and began AAI pacing 90.Dr. Roxan Hockey notified of CI 1..3-1.4. Dopamine increased to 5 mcg/kg/min and began AAI pacing at 90. Calcium chloride 1 gm IV added to #3 albumin

## 2013-10-09 NOTE — Transfer of Care (Signed)
Immediate Anesthesia Transfer of Care Note  Patient: Marissa Jefferson  Procedure(s) Performed: Procedure(s): CORONARY ARTERY BYPASS GRAFTING (CABG) x 4 using left internal mammary artery and right saphenous leg vein using endoscope. ([name]) INTRAOPERATIVE TRANSESOPHAGEAL ECHOCARDIOGRAM ([name])  Patient Location: SICU  Anesthesia Type:General  Level of Consciousness: sedated and unresponsive  Airway & Oxygen Therapy: Patient remains intubated per anesthesia plan and Patient placed on Ventilator (see vital sign flow sheet for setting)  Post-op Assessment: Report given to PACU RN and Post -op Vital signs reviewed and stable  Post vital signs: Reviewed and stable  Complications: No apparent anesthesia complications

## 2013-10-09 NOTE — Progress Notes (Signed)
10/09/13 1250  Vitals  Temp ! 96.6 F (35.9 C)  Pulse Rate 80  ECG Heart Rate 81  Resp 12  Oxygen Therapy  SpO2 98 %  Art Line  Arterial Line BP 118/66 mmHg  Arterial Line MAP (mmHg) 79 mmHg  Invasive Hemodynamic Monitoring  PAP 28/21 mmHg  PAP (Mean) 23 mmHg  CO (L/min) 2.4 L/min  CI (L/min/m2) 1.4 L/min/m2  Albumin 5% 220ml IV hung.

## 2013-10-10 ENCOUNTER — Inpatient Hospital Stay (HOSPITAL_COMMUNITY): Payer: Medicare Other

## 2013-10-10 LAB — GLUCOSE, CAPILLARY
GLUCOSE-CAPILLARY: 118 mg/dL — AB (ref 70–99)
GLUCOSE-CAPILLARY: 107 mg/dL — AB (ref 70–99)
GLUCOSE-CAPILLARY: 114 mg/dL — AB (ref 70–99)
GLUCOSE-CAPILLARY: 101 mg/dL — AB (ref 70–99)
GLUCOSE-CAPILLARY: 224 mg/dL — AB (ref 70–99)
Glucose-Capillary: 106 mg/dL — ABNORMAL HIGH (ref 70–99)
Glucose-Capillary: 110 mg/dL — ABNORMAL HIGH (ref 70–99)
Glucose-Capillary: 119 mg/dL — ABNORMAL HIGH (ref 70–99)
Glucose-Capillary: 119 mg/dL — ABNORMAL HIGH (ref 70–99)
Glucose-Capillary: 123 mg/dL — ABNORMAL HIGH (ref 70–99)
Glucose-Capillary: 125 mg/dL — ABNORMAL HIGH (ref 70–99)
Glucose-Capillary: 134 mg/dL — ABNORMAL HIGH (ref 70–99)
Glucose-Capillary: 95 mg/dL (ref 70–99)
Glucose-Capillary: 142 mg/dL — ABNORMAL HIGH (ref 70–99)
Glucose-Capillary: 102 mg/dL — ABNORMAL HIGH (ref 70–99)
Glucose-Capillary: 105 mg/dL — ABNORMAL HIGH (ref 70–99)
Glucose-Capillary: 120 mg/dL — ABNORMAL HIGH (ref 70–99)
Glucose-Capillary: 141 mg/dL — ABNORMAL HIGH (ref 70–99)
Glucose-Capillary: 145 mg/dL — ABNORMAL HIGH (ref 70–99)
Glucose-Capillary: 99 mg/dL (ref 70–99)

## 2013-10-10 LAB — POCT I-STAT, CHEM 8
BUN: 16 mg/dL (ref 6–23)
CREATININE: 0.8 mg/dL (ref 0.50–1.10)
Calcium, Ion: 1.17 mmol/L (ref 1.13–1.30)
Chloride: 107 mEq/L (ref 96–112)
Glucose, Bld: 140 mg/dL — ABNORMAL HIGH (ref 70–99)
HCT: 26 % — ABNORMAL LOW (ref 36.0–46.0)
HEMOGLOBIN: 8.8 g/dL — AB (ref 12.0–15.0)
Potassium: 4.1 mEq/L (ref 3.7–5.3)
SODIUM: 137 meq/L (ref 137–147)
TCO2: 22 mmol/L (ref 0–100)

## 2013-10-10 LAB — CBC
HCT: 28.3 % — ABNORMAL LOW (ref 36.0–46.0)
HCT: 27.1 % — ABNORMAL LOW (ref 36.0–46.0)
Hemoglobin: 9.5 g/dL — ABNORMAL LOW (ref 12.0–15.0)
Hemoglobin: 9.1 g/dL — ABNORMAL LOW (ref 12.0–15.0)
MCH: 31.8 pg (ref 26.0–34.0)
MCH: 32.5 pg (ref 26.0–34.0)
MCHC: 33.6 g/dL (ref 30.0–36.0)
MCHC: 33.6 g/dL (ref 30.0–36.0)
MCV: 94.6 fL (ref 78.0–100.0)
MCV: 96.8 fL (ref 78.0–100.0)
PLATELETS: 159 10*3/uL (ref 150–400)
Platelets: 165 10*3/uL (ref 150–400)
RBC: 2.99 MIL/uL — ABNORMAL LOW (ref 3.87–5.11)
RBC: 2.8 MIL/uL — ABNORMAL LOW (ref 3.87–5.11)
RDW: 12.5 % (ref 11.5–15.5)
RDW: 12.8 % (ref 11.5–15.5)
WBC: 13.6 10*3/uL — AB (ref 4.0–10.5)
WBC: 15.8 10*3/uL — ABNORMAL HIGH (ref 4.0–10.5)

## 2013-10-10 LAB — BASIC METABOLIC PANEL
ANION GAP: 10 (ref 5–15)
BUN: 16 mg/dL (ref 6–23)
CHLORIDE: 104 meq/L (ref 96–112)
CO2: 24 meq/L (ref 19–32)
Calcium: 8.6 mg/dL (ref 8.4–10.5)
Creatinine, Ser: 0.59 mg/dL (ref 0.50–1.10)
GFR calc Af Amer: >90 mL/min (ref >90)
GFR calc non Af Amer: 81 mL/min — ABNORMAL LOW (ref >90)
Glucose, Bld: 130 mg/dL — ABNORMAL HIGH (ref 70–99)
Potassium: 3.9 mEq/L (ref 3.7–5.3)
SODIUM: 138 meq/L (ref 137–147)

## 2013-10-10 LAB — POCT I-STAT 3, ART BLOOD GAS (G3+)
Bicarbonate: 24 mEq/L (ref 20.0–24.0)
O2 SAT: 99 %
PCO2 ART: 35.2 mmHg (ref 35.0–45.0)
TCO2: 25 mmol/L (ref 0–100)
pH, Arterial: 7.442 (ref 7.350–7.450)
pO2, Arterial: 115 mmHg — ABNORMAL HIGH (ref 80.0–100.0)

## 2013-10-10 LAB — MAGNESIUM
MAGNESIUM: 2 mg/dL (ref 1.5–2.5)
Magnesium: 1.8 mg/dL (ref 1.5–2.5)

## 2013-10-10 LAB — CREATININE, SERUM
CREATININE: 0.75 mg/dL (ref 0.50–1.10)
GFR calc Af Amer: 86 mL/min — ABNORMAL LOW (ref >90)
GFR calc non Af Amer: 75 mL/min — ABNORMAL LOW (ref >90)

## 2013-10-10 MED ORDER — ALBUMIN HUMAN 5 % IV SOLN
INTRAVENOUS | Status: AC
  Administered 2013-10-10: 12.5 g
  Filled 2013-10-10: qty 250

## 2013-10-10 MED ORDER — INSULIN DETEMIR 100 UNIT/ML ~~LOC~~ SOLN
25.0000 [IU] | Freq: Every day | SUBCUTANEOUS | Status: DC
  Administered 2013-10-11: 25 [IU] via SUBCUTANEOUS
  Filled 2013-10-10: qty 0.25

## 2013-10-10 MED ORDER — INSULIN DETEMIR 100 UNIT/ML ~~LOC~~ SOLN
25.0000 [IU] | Freq: Once | SUBCUTANEOUS | Status: AC
  Administered 2013-10-10: 25 [IU] via SUBCUTANEOUS
  Filled 2013-10-10: qty 0.25

## 2013-10-10 MED ORDER — INSULIN ASPART 100 UNIT/ML ~~LOC~~ SOLN
0.0000 [IU] | SUBCUTANEOUS | Status: DC
  Administered 2013-10-10: 1.8 [IU] via SUBCUTANEOUS
  Administered 2013-10-10: 8 [IU] via SUBCUTANEOUS
  Administered 2013-10-10 – 2013-10-11 (×2): 2 [IU] via SUBCUTANEOUS

## 2013-10-10 MED ORDER — ENOXAPARIN SODIUM 30 MG/0.3ML ~~LOC~~ SOLN
30.0000 mg | Freq: Every day | SUBCUTANEOUS | Status: DC
  Administered 2013-10-10 – 2013-10-14 (×5): 30 mg via SUBCUTANEOUS
  Filled 2013-10-10 (×6): qty 0.3

## 2013-10-10 MED ORDER — TRAMADOL HCL 50 MG PO TABS
50.0000 mg | ORAL_TABLET | Freq: Four times a day (QID) | ORAL | Status: DC | PRN
  Administered 2013-10-10: 50 mg via ORAL
  Administered 2013-10-11 – 2013-10-14 (×3): 100 mg via ORAL
  Administered 2013-10-16 (×2): 50 mg via ORAL
  Administered 2013-10-17: 100 mg via ORAL
  Filled 2013-10-10: qty 2
  Filled 2013-10-10 (×2): qty 1
  Filled 2013-10-10: qty 2
  Filled 2013-10-10 (×2): qty 1
  Filled 2013-10-10: qty 2
  Filled 2013-10-10: qty 1
  Filled 2013-10-10: qty 2

## 2013-10-10 MED ORDER — POTASSIUM CHLORIDE 10 MEQ/50ML IV SOLN
10.0000 meq | INTRAVENOUS | Status: AC
  Administered 2013-10-10 (×3): 10 meq via INTRAVENOUS
  Filled 2013-10-10 (×3): qty 50

## 2013-10-10 MED ORDER — FUROSEMIDE 10 MG/ML IJ SOLN
40.0000 mg | Freq: Once | INTRAMUSCULAR | Status: AC
  Administered 2013-10-10: 40 mg via INTRAVENOUS
  Filled 2013-10-10: qty 4

## 2013-10-10 NOTE — Progress Notes (Signed)
Subjective:  Awaken extubated after surgery. Complains of incisional pain when she sits up, no shortness of breath  Objective:  Vital Signs in the last 24 hours: BP 91/45  Pulse 31  Temp(Src) 99.1 F (37.3 C) (Oral)  Resp 17  Ht 5' 6.5" (1.689 m)  Wt 74.5 kg (164 lb 3.9 oz)  BMI 26.12 kg/m2  SpO2 96%  Physical Exam: Elderly female in no acute distress Lungs: Reduced breath sounds  Cardiac:  Regular rhythm, normal S1 and S2, no S3, occasional paced beats Extremities:  No edema present  Intake/Output from previous day: 07/31 0701 - 08/01 0700 In: 3893.7 [I.V.:4724.2; Blood:225; NG/GT:90; IV Piggyback:1590] Out: 3428 [Urine:3750; Emesis/NG output:30; Blood:1520; Chest Tube:370] Weight Filed Weights   10/08/13 0536 10/09/13 0500 10/10/13 0400  Weight: 67.541 kg (148 lb 14.4 oz) 68.266 kg (150 lb 8 oz) 74.5 kg (164 lb 3.9 oz)    Lab Results: Basic Metabolic Panel:  Recent Labs  10/08/13 0538  10/09/13 1836 10/10/13 0350  NA 142  < > 137 138  K 4.3  < > 4.2 3.9  CL 97  < > 104 104  CO2 30  --   --  24  GLUCOSE 145*  < > 108* 130*  BUN 26*  < > 15 16  CREATININE 0.89  < > 0.50 0.59  < > = values in this interval not displayed.  CBC:  Recent Labs  10/09/13 1825 10/09/13 1836 10/10/13 0350  WBC 13.3*  --  13.6*  HGB 9.6* 9.2* 9.5*  HCT 28.4* 27.0* 28.3*  MCV 95.3  --  94.6  PLT 158  --  159    BNP No results found for this basename: probnp    PROTIME: Lab Results  Component Value Date   INR 1.35 10/09/2013   INR 1.03 10/07/2013   INR 0.98 10/05/2013    Telemetry: Atrial paced rhythm with occasional PVCs, ventricularly sensed  Assessment/Plan:  1. Doing well following recent coronary bypass grafting 2. Chronic systolic heart failure with ischemic cardiomyopathy but doing relatively well       W. Doristine Church  MD [facility]  10/10/2013, 11:45 AM

## 2013-10-10 NOTE — Progress Notes (Signed)
Per MD verbal order, Dopamine was weaned off. Patient tolerated well for a little but then began having more frequent varying PVC's and drop in cuff and a-line pressures. SBP down into 80-90 with DBP in to 30-40's, MAP in 50's. Dopamine was restarted at 20mcg. Neo restarted and currently at 68mcg. Patient still paced AAI at 90, still 70-80's with PVC's underneath. Dr. Roxan Hockey notified, instructed to continue with dopamine and wean neo for pressure support. Will continue to monitor. Richardean Sale, RN

## 2013-10-10 NOTE — Procedures (Signed)
Extubation Procedure Note  Patient Details:   Name: Marissa Jefferson DOB: 1924/12/29 MRN: [id number]   Airway Documentation:     Evaluation  O2 sats: stable throughout Complications: No apparent complications Patient did tolerate procedure well. Bilateral Breath Sounds: Clear Suctioning: Airway Yes  Patient extubated to 4L .  Sats currently 99%.  Vitals are normal.  NIF of -20, VC of 1200.  Positive cuff leak.  Patient able to speak after extubation.  Will continue to monitor.  Philomena Doheny 10/10/2013, 9:14 AM

## 2013-10-10 NOTE — Progress Notes (Signed)
Up in chair, she was a little wobbly getting up  BP 119/58  Pulse 46  Temp(Src) 98.2 F (36.8 C) (Oral)  Resp 19  Ht 5' 6.5" (1.689 m)  Wt 164 lb 3.9 oz (74.5 kg)  BMI 26.12 kg/m2  SpO2 97%   Intake/Output Summary (Last 24 hours) at 10/10/13 1742 Last data filed at 10/10/13 1700  Gross per 24 hour  Intake 2415.67 ml  Output   1860 ml  Net 555.67 ml    K= 4.1, Cr=0.8  She is in good spirits and pain is well controlled

## 2013-10-10 NOTE — Progress Notes (Signed)
1 Day Post-Op Procedure(s) (LRB): CORONARY ARTERY BYPASS GRAFTING (CABG) x 4 using left internal mammary artery and right saphenous leg vein using endoscope. ([name]) INTRAOPERATIVE TRANSESOPHAGEAL ECHOCARDIOGRAM ([name]) Subjective: Just extubated Doesn't fell well, some incisional pain  Objective: Vital signs in last 24 hours: Temp:  [96.4 F (35.8 C)-100 F (37.8 C)] 99 F (37.2 C) (08/01 0834) Pulse Rate:  [70-91] 90 (08/01 0834) Cardiac Rhythm:  [-] Atrial paced (08/01 0800) Resp:  [10-25] 19 (08/01 0834) BP: (86-152)/(42-73) 139/53 mmHg (08/01 0834) SpO2:  [94 %-100 %] 99 % (08/01 0834) Arterial Line BP: (89-159)/(44-76) 138/56 mmHg (08/01 0800) FiO2 (%):  [40 %-50 %] 40 % (08/01 0834) Weight:  [164 lb 3.9 oz (74.5 kg)] 164 lb 3.9 oz (74.5 kg) (08/01 0400)  Hemodynamic parameters for last 24 hours: PAP: (27-39)/(15-26) 34/17 mmHg CO:  [2.2 L/min-3.5 L/min] 3.5 L/min CI:  [1.3 L/min/m2-2 L/min/m2] 1.9 L/min/m2  Intake/Output from previous day: 07/31 0701 - 08/01 0700 In: 6629.2 [I.V.:4724.2; Blood:225; NG/GT:90; IV Piggyback:1590] Out: 7517 [Urine:3750; Emesis/NG output:30; Blood:1520; Chest Tube:370] Intake/Output this shift: Total I/O In: 315.5 [I.V.:65.5; IV Piggyback:250] Out: 40 [Urine:30; Chest Tube:10]  General appearance: alert and cooperative Neurologic: no focal deficits Heart: regular rate and rhythm and + rub Lungs: diminished breath sounds bibasilar Abdomen: normal findings: soft, non-tender  Lab Results:  Recent Labs  10/09/13 1825 10/09/13 1836 10/10/13 0350  WBC 13.3*  --  13.6*  HGB 9.6* 9.2* 9.5*  HCT 28.4* 27.0* 28.3*  PLT 158  --  159   BMET:  Recent Labs  10/08/13 0538  10/09/13 1836 10/10/13 0350  NA 142  < > 137 138  K 4.3  < > 4.2 3.9  CL 97  < > 104 104  CO2 30  --   --  24  GLUCOSE 145*  < > 108* 130*  BUN 26*  < > 15 16  CREATININE 0.89  < > 0.50 0.59  CALCIUM 9.7  --   --  8.6  < > = values in this interval not displayed.   PT/INR:  Recent Labs  10/09/13 1230  LABPROT 16.7*  INR 1.35   ABG    Component Value Date/Time   PHART 7.442 10/10/2013 0904   HCO3 24.0 10/10/2013 0904   TCO2 25 10/10/2013 0904   ACIDBASEDEF 1.0 10/09/2013 2217   O2SAT 99.0 10/10/2013 0904   CBG (last 3)   Recent Labs  10/10/13 0455 10/10/13 0601 10/10/13 0649  GLUCAP 123* 107* 114*    Assessment/Plan: S/P Procedure(s) (LRB): CORONARY ARTERY BYPASS GRAFTING (CABG) x 4 using left internal mammary artery and right saphenous leg vein using endoscope. ([name]) INTRAOPERATIVE TRANSESOPHAGEAL ECHOCARDIOGRAM ([name]) POD # 1  CV- s/p CABG  Severe ischemic cardiomyopathy- will slowly wean milrinone over next 48 hours  Wean dopamine as tolerated  Dc swan, a line  RESP- pulmonary hygiene  RENAL- creatinine normal- diurese for volume overload  ENDO- transition of insulin drip  DVT prophylaxis- SCD + enoxparin  DC CT  OOB   LOS: 4 days    Marissa Jefferson C 10/10/2013

## 2013-10-10 NOTE — Progress Notes (Signed)
Pt placed back on full support due to failing wean parameters. VC less than 1.0 and NIF was no success at all. RT will continue to monitor.

## 2013-10-11 ENCOUNTER — Inpatient Hospital Stay (HOSPITAL_COMMUNITY): Payer: Medicare Other

## 2013-10-11 LAB — GLUCOSE, CAPILLARY
GLUCOSE-CAPILLARY: 111 mg/dL — AB (ref 70–99)
GLUCOSE-CAPILLARY: 149 mg/dL — AB (ref 70–99)
GLUCOSE-CAPILLARY: 177 mg/dL — AB (ref 70–99)
Glucose-Capillary: 88 mg/dL (ref 70–99)
Glucose-Capillary: 129 mg/dL — ABNORMAL HIGH (ref 70–99)
Glucose-Capillary: 206 mg/dL — ABNORMAL HIGH (ref 70–99)

## 2013-10-11 LAB — BASIC METABOLIC PANEL
Anion gap: 11 (ref 5–15)
BUN: 18 mg/dL (ref 6–23)
CO2: 23 mEq/L (ref 19–32)
Calcium: 8.7 mg/dL (ref 8.4–10.5)
Chloride: 102 mEq/L (ref 96–112)
Creatinine, Ser: 0.64 mg/dL (ref 0.50–1.10)
GFR calc Af Amer: >90 mL/min (ref >90)
GFR, EST NON AFRICAN AMERICAN: 79 mL/min — AB (ref >90)
GLUCOSE: 93 mg/dL (ref 70–99)
Potassium: 4.1 mEq/L (ref 3.7–5.3)
SODIUM: 136 meq/L — AB (ref 137–147)

## 2013-10-11 LAB — CBC
HCT: 27.8 % — ABNORMAL LOW (ref 36.0–46.0)
Hemoglobin: 9.2 g/dL — ABNORMAL LOW (ref 12.0–15.0)
MCH: 32.2 pg (ref 26.0–34.0)
MCHC: 33.1 g/dL (ref 30.0–36.0)
MCV: 97.2 fL (ref 78.0–100.0)
Platelets: 189 10*3/uL (ref 150–400)
RBC: 2.86 MIL/uL — ABNORMAL LOW (ref 3.87–5.11)
RDW: 12.8 % (ref 11.5–15.5)
WBC: 14.2 10*3/uL — ABNORMAL HIGH (ref 4.0–10.5)

## 2013-10-11 MED ORDER — AMIODARONE HCL IN DEXTROSE 360-4.14 MG/200ML-% IV SOLN
30.0000 mg/h | INTRAVENOUS | Status: AC
  Administered 2013-10-11 – 2013-10-12 (×2): 30 mg/h via INTRAVENOUS
  Filled 2013-10-11 (×4): qty 200

## 2013-10-11 MED ORDER — METFORMIN HCL 500 MG PO TABS
500.0000 mg | ORAL_TABLET | Freq: Two times a day (BID) | ORAL | Status: DC
  Administered 2013-10-11 – 2013-10-13 (×3): 500 mg via ORAL
  Filled 2013-10-11 (×8): qty 1

## 2013-10-11 MED ORDER — INSULIN ASPART 100 UNIT/ML ~~LOC~~ SOLN
0.0000 [IU] | Freq: Three times a day (TID) | SUBCUTANEOUS | Status: DC
  Administered 2013-10-11: 2 [IU] via SUBCUTANEOUS
  Administered 2013-10-11: 5 [IU] via SUBCUTANEOUS
  Administered 2013-10-12: 3 [IU] via SUBCUTANEOUS
  Administered 2013-10-12: 5 [IU] via SUBCUTANEOUS
  Administered 2013-10-13 (×2): 2 [IU] via SUBCUTANEOUS
  Administered 2013-10-13 – 2013-10-14 (×2): 3 [IU] via SUBCUTANEOUS
  Administered 2013-10-14 – 2013-10-21 (×18): 2 [IU] via SUBCUTANEOUS

## 2013-10-11 MED ORDER — CARVEDILOL 3.125 MG PO TABS
3.1250 mg | ORAL_TABLET | Freq: Two times a day (BID) | ORAL | Status: DC
  Administered 2013-10-11 – 2013-10-21 (×17): 3.125 mg via ORAL
  Filled 2013-10-11 (×23): qty 1

## 2013-10-11 MED ORDER — AMIODARONE HCL IN DEXTROSE 360-4.14 MG/200ML-% IV SOLN
60.0000 mg/h | INTRAVENOUS | Status: AC
  Administered 2013-10-11: 60 mg/h via INTRAVENOUS
  Filled 2013-10-11 (×2): qty 200

## 2013-10-11 MED ORDER — OXYBUTYNIN CHLORIDE ER 15 MG PO TB24
30.0000 mg | ORAL_TABLET | Freq: Every day | ORAL | Status: DC
  Administered 2013-10-11 – 2013-10-17 (×7): 30 mg via ORAL
  Filled 2013-10-11 (×8): qty 2

## 2013-10-11 MED ORDER — CARVEDILOL 6.25 MG PO TABS: 6.2500 mg | ORAL_TABLET | Freq: Two times a day (BID) | ORAL | Status: DC

## 2013-10-11 MED ORDER — FUROSEMIDE 10 MG/ML IJ SOLN
20.0000 mg | Freq: Two times a day (BID) | INTRAMUSCULAR | Status: DC
  Administered 2013-10-11 (×2): 20 mg via INTRAVENOUS
  Filled 2013-10-11 (×4): qty 2

## 2013-10-11 MED ORDER — AMIODARONE LOAD VIA INFUSION
150.0000 mg | Freq: Once | INTRAVENOUS | Status: AC
  Administered 2013-10-11: 150 mg via INTRAVENOUS
  Filled 2013-10-11: qty 83.34

## 2013-10-11 NOTE — Progress Notes (Signed)
Subjective:  X/o datigue, no SOB, transient a fib overnight.  Objective:  Vital Signs in the last 24 hours: BP 118/57  Pulse 100  Temp(Src) 97.7 F (36.5 C) (Oral)  Resp 23  Ht 5' 6.5" (1.689 m)  Wt 76.9 kg (169 lb 8.5 oz)  BMI 26.96 kg/m2  SpO2 97%  Physical Exam: Elderly female in no acute distress Lungs: Reduced breath sounds  Cardiac:  Regular rhythm, normal S1 and S2, no S3, occasional paced beats Extremities:  No edema present  Intake/Output from previous day: 08/01 0701 - 08/02 0700 In: 2040.3 [P.O.:540; I.V.:1000.3; IV Piggyback:500] Out: 930 [Urine:880; Chest Tube:50] Weight Filed Weights   10/09/13 0500 10/10/13 0400 10/11/13 0630  Weight: 68.266 kg (150 lb 8 oz) 74.5 kg (164 lb 3.9 oz) 76.9 kg (169 lb 8.5 oz)    Lab Results: Basic Metabolic Panel:  Recent Labs  10/10/13 0350  10/10/13 1642 10/11/13 0400  NA 138  --  137 136*  K 3.9  --  4.1 4.1  CL 104  --  107 102  CO2 24  --   --  23  GLUCOSE 130*  --  140* 93  BUN 16  --  16 18  CREATININE 0.59  < > 0.80 0.64  < > = values in this interval not displayed.  CBC:  Recent Labs  10/10/13 1629 10/10/13 1642 10/11/13 0400  WBC 15.8*  --  14.2*  HGB 9.1* 8.8* 9.2*  HCT 27.1* 26.0* 27.8*  MCV 96.8  --  97.2  PLT 165  --  189   PROTIME: Lab Results  Component Value Date   INR 1.35 10/09/2013   INR 1.03 10/07/2013   INR 0.98 10/05/2013    Telemetry: Atrial paced rhythm with occasional PVCs, ventricularly sensed  Assessment/Plan:  1. Doing well following recent coronary bypass grafting 2. Chronic systolic heart failure with ischemic cardiomyopathy but doing relatively well 3. Transient atrial fibrillation    W. Doristine Church  MD [facility]  10/11/2013, 11:51 AM

## 2013-10-11 NOTE — Progress Notes (Signed)
2 Days Post-Op Procedure(s) (LRB): CORONARY ARTERY BYPASS GRAFTING (CABG) x 4 using left internal mammary artery and right saphenous leg vein using endoscope. ([name]) INTRAOPERATIVE TRANSESOPHAGEAL ECHOCARDIOGRAM ([name]) Subjective: Feels tired, not much pain  Objective: Vital signs in last 24 hours: Temp:  [97.7 F (36.5 Jefferson)-99.1 F (37.3 Jefferson)] 97.7 F (36.5 Jefferson) (08/02 0810) Pulse Rate:  [25-100] 100 (08/02 0700) Cardiac Rhythm:  [-] Atrial fibrillation (08/02 0800) Resp:  [12-29] 23 (08/02 0700) BP: (88-148)/(35-83) 118/57 mmHg (08/02 0700) SpO2:  [94 %-100 %] 97 % (08/02 0700) Arterial Line BP: (82-151)/(34-59) 118/55 mmHg (08/02 0700) Weight:  [169 lb 8.5 oz (76.9 kg)] 169 lb 8.5 oz (76.9 kg) (08/02 0630)  Hemodynamic parameters for last 24 hours: PAP: (34)/(15) 34/15 mmHg  Intake/Output from previous day: 08/01 0701 - 08/02 0700 In: 2040.3 [P.O.:540; I.V.:1000.3; IV Piggyback:500] Out: 930 [Urine:880; Chest Tube:50] Intake/Output this shift: Total I/O In: 548.3 [P.O.:360; I.V.:188.3] Out: 60 [Urine:60]  General appearance: alert and no distress Neurologic: intact Heart: regular rate and rhythm Lungs: diminished breath sounds bibasilar Abdomen: normal findings: soft, non-tender  Lab Results:  Recent Labs  10/10/13 1629 10/10/13 1642 10/11/13 0400  WBC 15.8*  --  14.2*  HGB 9.1* 8.8* 9.2*  HCT 27.1* 26.0* 27.8*  PLT 165  --  189   BMET:  Recent Labs  10/10/13 0350  10/10/13 1642 10/11/13 0400  NA 138  --  137 136*  K 3.9  --  4.1 4.1  CL 104  --  107 102  CO2 24  --   --  23  GLUCOSE 130*  --  140* 93  BUN 16  --  16 18  CREATININE 0.59  < > 0.80 0.64  CALCIUM 8.6  --   --  8.7  < > = values in this interval not displayed.  PT/INR:  Recent Labs  10/09/13 1230  LABPROT 16.7*  INR 1.35   ABG    Component Value Date/Time   PHART 7.442 10/10/2013 0904   HCO3 24.0 10/10/2013 0904   TCO2 22 10/10/2013 1642   ACIDBASEDEF 1.0 10/09/2013 2217   O2SAT 99.0  10/10/2013 0904   CBG (last 3)   Recent Labs  10/10/13 2358 10/11/13 0359 10/11/13 0807  GLUCAP 149* 88 111*    Assessment/Plan: S/P Procedure(s) (LRB): CORONARY ARTERY BYPASS GRAFTING (CABG) x 4 using left internal mammary artery and right saphenous leg vein using endoscope. ([name]) INTRAOPERATIVE TRANSESOPHAGEAL ECHOCARDIOGRAM ([name]) - POD # 2 CABG x 4  CV- atrial fib earlier this morning, now in sinus brady on amiodarone, atrially paced  Off dopamine, on milrinone at 0.2  Change metoprolol to coreg  RESP- continue IS  RENAL- still volume overloaded, lasix 20 BID today  ENDO - CBG OK, restart metformin, change to [facility] and HS  Mobilize  SCD + enoxaparin for DVT prophylaxis    LOS: 5 days    Marissa Jefferson 10/11/2013

## 2013-10-11 NOTE — Progress Notes (Signed)
No complaints this evening. Did not like walking- says she felt weak  BP 90/71  Pulse 27  Temp(Src) 98.1 F (36.7 C) (Oral)  Resp 19  Ht 5' 6.5" (1.689 m)  Wt 169 lb 8.5 oz (76.9 kg)  BMI 26.96 kg/m2  SpO2 100%   Intake/Output Summary (Last 24 hours) at 10/11/13 2008 Last data filed at 10/11/13 1900  Gross per 24 hour  Intake 2262.8 ml  Output    690 ml  Net 1572.8 ml    Still on milrinone at 0.2, neo being titrated to maintain BP

## 2013-10-12 ENCOUNTER — Encounter (HOSPITAL_COMMUNITY): Payer: Self-pay | Admitting: Thoracic Surgery (Cardiothoracic Vascular Surgery)

## 2013-10-12 ENCOUNTER — Inpatient Hospital Stay (HOSPITAL_COMMUNITY): Payer: Medicare Other

## 2013-10-12 DIAGNOSIS — I5022 Chronic systolic (congestive) heart failure: Secondary | ICD-10-CM

## 2013-10-12 LAB — BASIC METABOLIC PANEL
ANION GAP: 12 (ref 5–15)
BUN: 22 mg/dL (ref 6–23)
CALCIUM: 8.4 mg/dL (ref 8.4–10.5)
CO2: 24 mEq/L (ref 19–32)
Chloride: 97 mEq/L (ref 96–112)
Creatinine, Ser: 0.83 mg/dL (ref 0.50–1.10)
GFR calc Af Amer: 72 mL/min — ABNORMAL LOW (ref >90)
GFR, EST NON AFRICAN AMERICAN: 62 mL/min — AB (ref >90)
Glucose, Bld: 123 mg/dL — ABNORMAL HIGH (ref 70–99)
POTASSIUM: 3.9 meq/L (ref 3.7–5.3)
Sodium: 133 mEq/L — ABNORMAL LOW (ref 137–147)

## 2013-10-12 LAB — CARBOXYHEMOGLOBIN
Carboxyhemoglobin: 1.1 % (ref 0.5–1.5)
Methemoglobin: 0.7 % (ref 0.0–1.5)
O2 Saturation: 54 %
Total hemoglobin: 8 g/dL — ABNORMAL LOW (ref 12.0–16.0)

## 2013-10-12 LAB — CBC
HCT: 26.7 % — ABNORMAL LOW (ref 36.0–46.0)
Hemoglobin: 8.9 g/dL — ABNORMAL LOW (ref 12.0–15.0)
MCH: 32.8 pg (ref 26.0–34.0)
MCHC: 33.3 g/dL (ref 30.0–36.0)
MCV: 98.5 fL (ref 78.0–100.0)
PLATELETS: 168 10*3/uL (ref 150–400)
RBC: 2.71 MIL/uL — ABNORMAL LOW (ref 3.87–5.11)
RDW: 12.8 % (ref 11.5–15.5)
WBC: 13 10*3/uL — ABNORMAL HIGH (ref 4.0–10.5)

## 2013-10-12 LAB — GLUCOSE, CAPILLARY
GLUCOSE-CAPILLARY: 110 mg/dL — AB (ref 70–99)
GLUCOSE-CAPILLARY: 249 mg/dL — AB (ref 70–99)
Glucose-Capillary: 166 mg/dL — ABNORMAL HIGH (ref 70–99)
Glucose-Capillary: 184 mg/dL — ABNORMAL HIGH (ref 70–99)

## 2013-10-12 MED ORDER — MILRINONE IN DEXTROSE 20 MG/100ML IV SOLN
0.1250 ug/kg/min | INTRAVENOUS | Status: DC
  Administered 2013-10-12: 0.125 ug/kg/min via INTRAVENOUS
  Filled 2013-10-12: qty 100

## 2013-10-12 MED ORDER — FUROSEMIDE 10 MG/ML IJ SOLN
40.0000 mg | Freq: Two times a day (BID) | INTRAMUSCULAR | Status: DC
  Administered 2013-10-12 – 2013-10-13 (×4): 40 mg via INTRAVENOUS
  Filled 2013-10-12 (×6): qty 4

## 2013-10-12 MED ORDER — AMIODARONE HCL 200 MG PO TABS
400.0000 mg | ORAL_TABLET | Freq: Two times a day (BID) | ORAL | Status: DC
  Administered 2013-10-12 – 2013-10-15 (×7): 400 mg via ORAL
  Filled 2013-10-12 (×8): qty 2

## 2013-10-12 MED ORDER — ALBUMIN HUMAN 5 % IV SOLN
12.5000 g | Freq: Once | INTRAVENOUS | Status: DC
  Filled 2013-10-12: qty 250

## 2013-10-12 NOTE — Progress Notes (Signed)
Subjective:  Day 3 s/p CABG x4  Objective:   Vital Signs in the last 24 hours: Temp:  [97.9 F (36.6 C)-98.5 F (36.9 C)] 98 F (36.7 C) (08/03 0758) Pulse Rate:  [27-106] 90 (08/03 0900) Resp:  [9-24] 19 (08/03 0900) BP: (79-143)/(40-93) 100/40 mmHg (08/03 0900) SpO2:  [89 %-100 %] 100 % (08/03 0900) Arterial Line BP: (116)/(49) 116/49 mmHg (08/02 1100) Weight:  [174 lb 9.7 oz (79.2 kg)] 174 lb 9.7 oz (79.2 kg) (08/03 0645)  Intake/Output from previous day: 08/02 0701 - 08/03 0700 In: 2548.9 [P.O.:660; I.V.:1888.9] Out: 945 [Urine:945]  Medications: . acetaminophen  1,000 mg Oral 4 times per day   Or  . acetaminophen (TYLENOL) oral liquid 160 mg/5 mL  1,000 mg Per Tube 4 times per day  . albumin human  12.5 g Intravenous Once  . amiodarone  400 mg Oral BID  . antiseptic oral rinse  7 mL Mouth Rinse QID  . aspirin EC  325 mg Oral Daily   Or  . aspirin  324 mg Per Tube Daily  . atorvastatin  80 mg Oral Daily  . bisacodyl  10 mg Oral Daily   Or  . bisacodyl  10 mg Rectal Daily  . carvedilol  3.125 mg Oral BID WC  . docusate sodium  200 mg Oral Daily  . enoxaparin (LOVENOX) injection  30 mg Subcutaneous QHS  . furosemide  40 mg Intravenous BID  . insulin aspart  0-15 Units Subcutaneous TID WC  . levothyroxine  112 mcg Oral QAC breakfast  . metFORMIN  500 mg Oral BID WC  . oxybutynin  30 mg Oral Daily  . pantoprazole  40 mg Oral Daily  . sodium chloride  10 mL Intravenous Q12H  . sodium chloride  3 mL Intravenous Q12H    . sodium chloride Stopped (10/10/13 1000)  . sodium chloride Stopped (10/10/13 1000)  . sodium chloride 250 mL (10/12/13 0400)  . amiodarone 30 mg/hr (10/12/13 0900)  . dexmedetomidine Stopped (10/09/13 1800)  . DOPamine Stopped (10/11/13 0200)  . lactated ringers Stopped (10/10/13 1000)  . milrinone 0.2 mcg/kg/min (10/12/13 0900)  . nitroGLYCERIN Stopped (10/09/13 1745)  . phenylephrine (NEO-SYNEPHRINE) Adult infusion 30 mcg/min (10/12/13  0400)  . phenylephrine (NEO-SYNEPHRINE) Adult infusion 30 mcg/min (10/12/13 0900)    Physical Exam:   General appearance: alert, cooperative and frail Neck: no adenopathy, supple, symmetrical, trachea midline and thyroid not enlarged, symmetric, no tenderness/mass/nodules Lungs: decreased BS at bases Heart: regular rate and rhythm and 1/6 sem Abdomen: soft, non-tender; bowel sounds normal; no masses,  no organomegaly Extremities: trivial edema Neurologic: Grossly normal   Rate: 90  Rhythm: V paced; underlying sinus at 60-70 with frequent multifocal PVC's  Lab Results:   Recent Labs  10/11/13 0400 10/12/13 0415  NA 136* 133*  K 4.1 3.9  CL 102 97  CO2 23 24  GLUCOSE 93 123*  BUN 18 22  CREATININE 0.64 0.83    No results found for this basename: TROPONINI, CK, MB,  in the last 72 hours  Hepatic Function Panel No results found for this basename: PROT, ALBUMIN, AST, ALT, ALKPHOS, BILITOT, BILIDIR, IBILI,  in the last 72 hours  Recent Labs  10/09/13 1230  INR 1.35   BNP (last 3 results) No results found for this basename: PROBNP,  in the last 8760 hours  Lipid Panel     Component Value Date/Time   CHOL 148 10/07/2013 0350   TRIG 111 10/07/2013 0350  HDL 40 10/07/2013 0350   CHOLHDL 3.7 10/07/2013 0350   VLDL 22 10/07/2013 0350   LDLCALC 86 10/07/2013 0350      Imaging:  Dg Chest Port 1 View  10/12/2013   CLINICAL DATA:  Follow-up atelectasis/pleural effusion.  EXAM: PORTABLE CHEST - 1 VIEW  COMPARISON:  10/11/2013.  FINDINGS: Post CABG.  Cardiomegaly.  Basilar parenchymal changes greater on the left may represent pleural fluid with atelectasis although limited for evaluating for underlying infiltrate or mass. When compared to prior examination, degree of right base consolidation has progressed which may be partially explained by layering pleural fluid.  Pulmonary vascular congestion.  Right central line tip proximal superior vena cava level.  No gross pneumothorax.   Left axilla surgical clips.  Calcified tortuous aorta.  IMPRESSION: Post CABG.  Cardiomegaly.  Basilar parenchymal changes greater on the left may represent pleural fluid with atelectasis although limited for evaluating for underlying infiltrate or mass. When compared to prior examination, degree of right base consolidation has progressed which may be partially explained by layering pleural fluid.  Pulmonary vascular congestion.  Calcified tortuous aorta.   Electronically Signed   By: Chauncey Cruel M.D.   On: 10/12/2013 07:09   Dg Chest Port 1 View  10/11/2013   CLINICAL DATA:  Postop cardiac surgery  EXAM: PORTABLE CHEST - 1 VIEW  COMPARISON:  10/10/2013  FINDINGS: Interval removal of endotracheal tube, enteric tube, right IJ Swan-Ganz catheter, left chest tube, and mediastinal drain. Residual right IJ venous sheath.  Moderate left pleural effusion. Trace right pleural effusion. No frank interstitial edema.  Underlying chronic interstitial markings.  No pneumothorax.  The heart is top-normal in size. Postsurgical changes related to prior CABG.  IMPRESSION: Postsurgical changes related to prior CABG with moderate left pleural effusion. No pneumothorax.   Electronically Signed   By: Julian Hy M.D.   On: 10/11/2013 07:32      Assessment/Plan:   Principal Problem:   Acute on chronic combined systolic and diastolic CHF, NYHA class 3 Active Problems:   CAD (coronary artery disease), native coronary artery   Ischemic dilated cardiomyopathy   NSVT (nonsustained ventricular tachycardia)   S/P CABG x 4  Now off dopamine. Still on milrinone at 0.2 micrograms/kilo/min and neo. To switch to oral amiodarone. Continue diuresis , received lasix 40 mg this am. Atelectasis with small effusion on CXR.    Troy Sine, MD, [facility] 10/12/2013, 10:37 AM

## 2013-10-12 NOTE — Progress Notes (Signed)
3 Days Post-Op Procedure(s) (LRB): CORONARY ARTERY BYPASS GRAFTING (CABG) x 4 using left internal mammary artery and right saphenous leg vein using endoscope. ([name]) INTRAOPERATIVE TRANSESOPHAGEAL ECHOCARDIOGRAM ([name]) Subjective: Some discomfort this AM  Objective: Vital signs in last 24 hours: Temp:  [97.7 F (36.5 C)-98.5 F (36.9 C)] 97.9 F (36.6 C) (08/03 0400) Pulse Rate:  [27-106] 91 (08/03 0700) Cardiac Rhythm:  [-] Atrial paced (08/03 0400) Resp:  [9-24] 15 (08/03 0700) BP: (79-143)/(41-93) 112/93 mmHg (08/03 0700) SpO2:  [89 %-100 %] 100 % (08/03 0700) Arterial Line BP: (110-126)/(48-58) 116/49 mmHg (08/02 1100) Weight:  [174 lb 9.7 oz (79.2 kg)] 174 lb 9.7 oz (79.2 kg) (08/03 0645)  Hemodynamic parameters for last 24 hours:    Intake/Output from previous day: 08/02 0701 - 08/03 0700 In: 2548.9 [P.O.:660; I.V.:1888.9] Out: 945 [Urine:945] Intake/Output this shift:    General appearance: alert and no distress Neurologic: intact Heart: regular rate and rhythm Lungs: diminished breath sounds bibasilar Abdomen: normal findings: soft, non-tender Wound: clean and dry  Lab Results:  Recent Labs  10/11/13 0400 10/12/13 0415  WBC 14.2* 13.0*  HGB 9.2* 8.9*  HCT 27.8* 26.7*  PLT 189 168   BMET:  Recent Labs  10/11/13 0400 10/12/13 0415  NA 136* 133*  K 4.1 3.9  CL 102 97  CO2 23 24  GLUCOSE 93 123*  BUN 18 22  CREATININE 0.64 0.83  CALCIUM 8.7 8.4    PT/INR:  Recent Labs  10/09/13 1230  LABPROT 16.7*  INR 1.35   ABG    Component Value Date/Time   PHART 7.442 10/10/2013 0904   HCO3 24.0 10/10/2013 0904   TCO2 22 10/10/2013 1642   ACIDBASEDEF 1.0 10/09/2013 2217   O2SAT 99.0 10/10/2013 0904   CBG (last 3)   Recent Labs  10/11/13 1239 10/11/13 1545 10/11/13 2132  GLUCAP 129* 206* 177*    Assessment/Plan: S/P Procedure(s) (LRB): CORONARY ARTERY BYPASS GRAFTING (CABG) x 4 using left internal mammary artery and right saphenous leg vein using  endoscope. ([name]) INTRAOPERATIVE TRANSESOPHAGEAL ECHOCARDIOGRAM ([name]) - CV- maintaining SR with PVC on amiodarone- change to PO  Wean neo as BP tolerates  Check co-ox- consider weaning milrinone  RESP- bibasilar atelectasis and effusions  RENAL- labs Ok but poor response to 20 mg of lasix- will give 40 BID today  ENDO- CBG OK on metformin  Deconditioning- PT consult  DVT prophylaxis=- enoxaparin + SCD   LOS: 6 days    HENDRICKSON,STEVEN C 10/12/2013

## 2013-10-12 NOTE — Progress Notes (Signed)
PT Cancellation Note  Patient Details Name: Marissa Jefferson MRN: [id number] DOB: 05-15-1924   Cancelled Treatment:    Reason Eval/Treat Not Completed: Other (comment).  Per RN report, pt just got done walking with RN staff and was up [place] in chair all AM.  Pt is sleeping soundly.  PT will check back and asked RN to wait for gait until PT came in AM to walk with her.  Will preform evaluation tomorrow AM.  Thanks,  Barbarann Ehlers. [place], [place], DPT [phone]   10/12/2013, 3:35 PM

## 2013-10-12 NOTE — Progress Notes (Signed)
Addendum:  Pt prefers [employer].

## 2013-10-12 NOTE — Progress Notes (Signed)
Patient ID: Marissa Jefferson, female   DOB: March 10, 1925, 80 y.o.   MRN: [id number] EVENING ROUNDS NOTE :     [address]       [place] [address]             [phone]                 3 Days Post-Op Procedure(s) (LRB): CORONARY ARTERY BYPASS GRAFTING (CABG) x 4 using left internal mammary artery and right saphenous leg vein using endoscope. ([name]) INTRAOPERATIVE TRANSESOPHAGEAL ECHOCARDIOGRAM ([name])  Total Length of Stay:  LOS: 6 days  BP 97/57  Pulse 89  Temp(Src) 98.5 F (36.9 C) (Oral)  Resp 16  Ht 5' 6.5" (1.689 m)  Wt 174 lb 9.7 oz (79.2 kg)  BMI 27.76 kg/m2  SpO2 99%  .Intake/Output     08/02 0701 - 08/03 0700 08/03 0701 - 08/04 0700   P.O. 660 240   I.V. (mL/kg) 1888.9 (23.8) 342.5 (4.3)   IV Piggyback     Total Intake(mL/kg) 2548.9 (32.2) 582.5 (7.4)   Urine (mL/kg/hr) 945 (0.5) 745 (0.9)   Chest Tube     Total Output 945 745   Net +1603.9 -162.5          . sodium chloride Stopped (10/10/13 1000)  . sodium chloride Stopped (10/10/13 1000)  . sodium chloride 250 mL (10/12/13 1600)  . dexmedetomidine Stopped (10/09/13 1800)  . DOPamine Stopped (10/11/13 0200)  . lactated ringers Stopped (10/10/13 1000)  . milrinone 0.125 mcg/kg/min (10/12/13 1600)  . nitroGLYCERIN Stopped (10/09/13 1745)  . phenylephrine (NEO-SYNEPHRINE) Adult infusion 30 mcg/min (10/12/13 0400)  . phenylephrine (NEO-SYNEPHRINE) Adult infusion 30 mcg/min (10/12/13 1600)     Lab Results  Component Value Date   WBC 13.0* 10/12/2013   HGB 8.9* 10/12/2013   HCT 26.7* 10/12/2013   PLT 168 10/12/2013   GLUCOSE 123* 10/12/2013   CHOL 148 10/07/2013   TRIG 111 10/07/2013   HDL 40 10/07/2013   LDLCALC 86 10/07/2013   ALT 10 10/07/2013   AST 15 10/07/2013   NA 133* 10/12/2013   K 3.9 10/12/2013   CL 97 10/12/2013   CREATININE 0.83 10/12/2013   BUN 22 10/12/2013   CO2 24 10/12/2013   INR 1.35 10/09/2013   HGBA1C 6.8* 10/07/2013   Still on neo, Cordarone, milrinone  Walked some today but limited by  bad joints  Grace Isaac MD  Beeper [phone] Office [phone] 10/12/2013 5:09 PM

## 2013-10-12 NOTE — Progress Notes (Signed)
Clinical Social Work Department BRIEF PSYCHOSOCIAL ASSESSMENT 10/12/2013  Patient:  Marissa Jefferson, Marissa Jefferson     Account Number:  [id number]     Admit date:  10/06/2013  Clinical Social Worker:  Ulyess Blossom  Date/Time:  10/12/2013 07:40 PM  Referred by:  Care Management  Date Referred:  10/12/2013 Referred for  SNF Placement   Other Referral:   Interview type:  Patient Other interview type:   Database review.    PSYCHOSOCIAL DATA Living Status:  ALONE Admitted from facility:   Level of care:   Primary support name:  ann richardson Primary support relationship to patient:  CHILD, ADULT Degree of support available:   Pt reports good support from son/daughter, as well as community ans church friends.    CURRENT CONCERNS Current Concerns  Post-Acute Placement   Other Concerns:    SOCIAL WORK ASSESSMENT / PLAN CSW met with pt to discuss role of CSW/dcp.  pt lives alone and was independent with ADLs pta.  Pt lives on the second floor of a condo building and has to walk up 17 steps to get to her unit.  Pt needs to be independent in order to return to her home.  Pt is agreeable to [facility] for rehab at d/c and prefers [facility].   Assessment/plan status:  Psychosocial Support/Ongoing Assessment of Needs Other assessment/ plan:   CSW to begin bed search, file for [facility] number and facilitate NH tx as appropriate.   Information/referral to community resources:    PATIENT'S/FAMILY'S RESPONSE TO PLAN OF CARE: Pt not happy about needing assistance since she has been independent all of her life.  She is motivated to begin therapies so she can get back home ASAP.  CSW provided pt with emotional support.

## 2013-10-13 ENCOUNTER — Inpatient Hospital Stay (HOSPITAL_COMMUNITY): Payer: Medicare Other

## 2013-10-13 LAB — COMPREHENSIVE METABOLIC PANEL
ALK PHOS: 52 U/L (ref 39–117)
ALT: 55 U/L — ABNORMAL HIGH (ref 0–35)
ANION GAP: 12 (ref 5–15)
AST: 36 U/L (ref 0–37)
Albumin: 2.6 g/dL — ABNORMAL LOW (ref 3.5–5.2)
BUN: 20 mg/dL (ref 6–23)
CO2: 26 mEq/L (ref 19–32)
Calcium: 8.3 mg/dL — ABNORMAL LOW (ref 8.4–10.5)
Chloride: 99 mEq/L (ref 96–112)
Creatinine, Ser: 0.74 mg/dL (ref 0.50–1.10)
GFR calc Af Amer: 87 mL/min — ABNORMAL LOW (ref >90)
GFR, EST NON AFRICAN AMERICAN: 75 mL/min — AB (ref >90)
Glucose, Bld: 114 mg/dL — ABNORMAL HIGH (ref 70–99)
Potassium: 4 mEq/L (ref 3.7–5.3)
Sodium: 137 mEq/L (ref 137–147)
TOTAL PROTEIN: 5.1 g/dL — AB (ref 6.0–8.3)
Total Bilirubin: 0.6 mg/dL (ref 0.3–1.2)

## 2013-10-13 LAB — CARBOXYHEMOGLOBIN
CARBOXYHEMOGLOBIN: 1.3 % (ref 0.5–1.5)
METHEMOGLOBIN: 0.9 % (ref 0.0–1.5)
O2 Saturation: 52.2 %
Total hemoglobin: 9.6 g/dL — ABNORMAL LOW (ref 12.0–16.0)

## 2013-10-13 LAB — CBC
HEMATOCRIT: 26.2 % — AB (ref 36.0–46.0)
HEMOGLOBIN: 8.8 g/dL — AB (ref 12.0–15.0)
MCH: 32.1 pg (ref 26.0–34.0)
MCHC: 33.6 g/dL (ref 30.0–36.0)
MCV: 95.6 fL (ref 78.0–100.0)
Platelets: 215 10*3/uL (ref 150–400)
RBC: 2.74 MIL/uL — ABNORMAL LOW (ref 3.87–5.11)
RDW: 12.6 % (ref 11.5–15.5)
WBC: 11.1 10*3/uL — ABNORMAL HIGH (ref 4.0–10.5)

## 2013-10-13 LAB — GLUCOSE, CAPILLARY
GLUCOSE-CAPILLARY: 165 mg/dL — AB (ref 70–99)
Glucose-Capillary: 129 mg/dL — ABNORMAL HIGH (ref 70–99)
Glucose-Capillary: 147 mg/dL — ABNORMAL HIGH (ref 70–99)
Glucose-Capillary: 160 mg/dL — ABNORMAL HIGH (ref 70–99)

## 2013-10-13 MED ORDER — POTASSIUM CHLORIDE CRYS ER 20 MEQ PO TBCR
20.0000 meq | EXTENDED_RELEASE_TABLET | Freq: Two times a day (BID) | ORAL | Status: DC
  Administered 2013-10-13 – 2013-10-15 (×6): 20 meq via ORAL
  Filled 2013-10-13 (×9): qty 1

## 2013-10-13 MED ORDER — METFORMIN HCL 500 MG PO TABS
1000.0000 mg | ORAL_TABLET | Freq: Two times a day (BID) | ORAL | Status: DC
  Administered 2013-10-13 – 2013-10-21 (×16): 1000 mg via ORAL
  Filled 2013-10-13 (×18): qty 2

## 2013-10-13 MED ORDER — GLUCERNA SHAKE PO LIQD
237.0000 mL | Freq: Two times a day (BID) | ORAL | Status: DC
  Administered 2013-10-13 – 2013-10-21 (×9): 237 mL via ORAL
  Filled 2013-10-13: qty 237

## 2013-10-13 MED FILL — Magnesium Sulfate Inj 50%: INTRAMUSCULAR | Qty: 10 | Status: AC

## 2013-10-13 MED FILL — Potassium Chloride Inj 2 mEq/ML: INTRAVENOUS | Qty: 40 | Status: AC

## 2013-10-13 MED FILL — Heparin Sodium (Porcine) Inj 1000 Unit/ML: INTRAMUSCULAR | Qty: 30 | Status: AC

## 2013-10-13 NOTE — Progress Notes (Addendum)
[facility] DAILY ICU PROGRESS NOTE                   [address]            [place] [address]          [phone]   4 Days Post-Op Procedure(s) (LRB): CORONARY ARTERY BYPASS GRAFTING (CABG) x 4 using left internal mammary artery and right saphenous leg vein using endoscope. ([name]) INTRAOPERATIVE TRANSESOPHAGEAL ECHOCARDIOGRAM ([name])  Total Length of Stay:  LOS: 7 days   Subjective: Comfortable, no complaints.     Objective: Vital signs in last 24 hours: Temp:  [97.9 F (36.6 C)-98.5 F (36.9 C)] 98.1 F (36.7 C) (08/04 0730) Pulse Rate:  [41-92] 90 (08/04 0700) Cardiac Rhythm:  [-] Atrial paced (08/04 0000) Resp:  [13-33] 16 (08/04 0700) BP: (91-124)/(37-68) 121/59 mmHg (08/04 0700) SpO2:  [97 %-100 %] 99 % (08/04 0700) Weight:  [171 lb 8.3 oz (77.8 kg)] 171 lb 8.3 oz (77.8 kg) (08/04 0500)  Filed Weights   10/11/13 0630 10/12/13 0645 10/13/13 0500  Weight: 169 lb 8.5 oz (76.9 kg) 174 lb 9.7 oz (79.2 kg) 171 lb 8.3 oz (77.8 kg)  PRE-OPERATIVE WEIGHT: 68 kg   Weight change: -3 lb 1.4 oz (-1.4 kg)   Hemodynamic parameters for last 24 hours:    Intake/Output from previous day: 08/03 0701 - 08/04 0700 In: 1133.9 [P.O.:420; I.V.:713.9] Out: 3245 [Urine:3245]  CBGs [phone]    Current Meds: Scheduled Meds: . acetaminophen  1,000 mg Oral 4 times per day   Or  . acetaminophen (TYLENOL) oral liquid 160 mg/5 mL  1,000 mg Per Tube 4 times per day  . albumin human  12.5 g Intravenous Once  . amiodarone  400 mg Oral BID  . antiseptic oral rinse  7 mL Mouth Rinse QID  . aspirin EC  325 mg Oral Daily   Or  . aspirin  324 mg Per Tube Daily  . atorvastatin  80 mg Oral Daily  . bisacodyl  10 mg Oral Daily   Or  . bisacodyl  10 mg Rectal Daily  . carvedilol  3.125 mg Oral BID WC  . docusate sodium  200 mg Oral Daily  . enoxaparin (LOVENOX) injection  30 mg Subcutaneous QHS  . furosemide  40 mg Intravenous BID  . insulin aspart  0-15 Units Subcutaneous  TID WC  . levothyroxine  112 mcg Oral QAC breakfast  . metFORMIN  500 mg Oral BID WC  . oxybutynin  30 mg Oral Daily  . pantoprazole  40 mg Oral Daily  . sodium chloride  10 mL Intravenous Q12H  . sodium chloride  3 mL Intravenous Q12H   Continuous Infusions: . sodium chloride Stopped (10/10/13 1000)  . sodium chloride Stopped (10/10/13 1000)  . sodium chloride Stopped (10/12/13 2000)  . dexmedetomidine Stopped (10/09/13 1800)  . DOPamine Stopped (10/11/13 0200)  . lactated ringers Stopped (10/10/13 1000)  . milrinone 0.125 mcg/kg/min (10/13/13 0000)  . nitroGLYCERIN Stopped (10/09/13 1745)  . phenylephrine (NEO-SYNEPHRINE) Adult infusion 30 mcg/min (10/12/13 0400)  . phenylephrine (NEO-SYNEPHRINE) Adult infusion 30 mcg/min (10/13/13 0000)   PRN Meds:.metoprolol, midazolam, morphine injection, ondansetron (ZOFRAN) IV, sodium chloride, sodium chloride, traMADol  Physical Exam: General appearance: alert, cooperative and no distress Heart: RRR, paced at 90 Lungs: Few crackles in bases Extremities: +LE edema Wound: Dressed and dry  Lab Results: CBC: Recent Labs  10/12/13 0415 10/13/13 0350  WBC 13.0* 11.1*  HGB 8.9*  8.8*  HCT 26.7* 26.2*  PLT 168 215   BMET:  Recent Labs  10/12/13 0415 10/13/13 0350  NA 133* 137  K 3.9 4.0  CL 97 99  CO2 24 26  GLUCOSE 123* 114*  BUN 22 20  CREATININE 0.83 0.74  CALCIUM 8.4 8.3*    PT/INR: No results found for this basename: LABPROT, INR,  in the last 72 hours Radiology: [facility] Chest Port 1 View 10/13/2013 EXAM:  PORTABLE CHEST - 1 VIEW  COMPARISON: One-view chest 10/12/2013.  FINDINGS:  The right IJ sheath remains in place. Oval erosion is improving  will. The patient is status post median sternotomy for CABG.  Bilateral pleural effusions persist, left greater than right. There  is some associated atelectasis. Surgical clips are noted in the left  axilla.  IMPRESSION:  1. Similar appearance of bilateral pleural effusions,  left greater  than right.  2. Improved aeration with decreasing edema.      Dg Chest Port 1 View  10/12/2013   CLINICAL DATA:  Follow-up atelectasis/pleural effusion.  EXAM: PORTABLE CHEST - 1 VIEW  COMPARISON:  10/11/2013.  FINDINGS: Post CABG.  Cardiomegaly.  Basilar parenchymal changes greater on the left may represent pleural fluid with atelectasis although limited for evaluating for underlying infiltrate or mass. When compared to prior examination, degree of right base consolidation has progressed which may be partially explained by layering pleural fluid.  Pulmonary vascular congestion.  Right central line tip proximal superior vena cava level.  No gross pneumothorax.  Left axilla surgical clips.  Calcified tortuous aorta.  IMPRESSION: Post CABG.  Cardiomegaly.  Basilar parenchymal changes greater on the left may represent pleural fluid with atelectasis although limited for evaluating for underlying infiltrate or mass. When compared to prior examination, degree of right base consolidation has progressed which may be partially explained by layering pleural fluid.  Pulmonary vascular congestion.  Calcified tortuous aorta.   Electronically Signed   By: Chauncey Cruel M.D.   On: 10/12/2013 07:09     Assessment/Plan: S/P Procedure(s) (LRB): CORONARY ARTERY BYPASS GRAFTING (CABG) x 4 using left internal mammary artery and right saphenous leg vein using endoscope. ([name]) INTRAOPERATIVE TRANSESOPHAGEAL ECHOCARDIOGRAM ([name])  CV- Acute/chronic systolic/diastolic HF, ischemic cardiomyopathy.  Remains on Milrinone, Neo gtts. Co-ox yesterday=54% (not done today).  SBPs low normal, but trending up.  Continue to wean gtts as able.  On po Amio for postop AF.  Vol overload- excellent UOP, wt remains up.  Continue diuresis.  DM- sugars stable back on po meds. A1C=6.8  Expected postop blood loss anemia- H/H generally stable.  Pulm- stable L effusion/atelectasis on CXR.  Continue pulm toilet,  diuresis.  Deconditioning- Continue PT, ambulation.  COLLINS,GINA H 10/13/2013 7:37 AM  Patient seen and examined, agree with above Diuresed well for the first time yesterday Will check co-ox again this AM, if acceptable will dc milrinone Increase metformin to home dose of 1000 mg BID

## 2013-10-13 NOTE — Progress Notes (Signed)
NUTRITION FOLLOW UP  INTERVENTION: Glucerna Shake po BID, each supplement provides 220 kcal and 10 grams of protein RD to follow for nutrition care plan  NUTRITION DIAGNOSIS: Inadequate oral intake now related to limited appetite as evidenced by PO intake 35-50%, ongoing  New Goal: Pt to meet >/= 90% of their estimated nutrition needs, progressing  Monitor:  PO & supplemental intake, weight, labs, I/O's  ASSESSMENT: 80 yo Female with PMH of HTN, DM, HLD; s/p cardiac cath 7/28 which showed severe three-vessel CAD with chronic RCA occlusion with collateralization from the LAD and circumflex.  Patient s/p procedure 7/31: CORONARY ARTERY BYPASS GRAFTING x 4   Patient extubated 8/1.  Diet advanced to Clear Liquids 8/1.  Currently on a Heart Healthy/Carbohydrate Modified diet.  PO intake variable at 35-50% per flowsheet records.  Would benefit from addition of oral nutrition supplements.  RD to order.  Height: Ht Readings from Last 1 Encounters:  10/08/13 5' 6.5" (1.689 m)    Weight: Wt Readings from Last 1 Encounters:  10/13/13 171 lb 8.3 oz (77.8 kg)    BMI:  Body mass index is 27.27 kg/(m^2).  Re-estimated Nutritional Needs: Kcal: 1700-1900 Protein: 90-100 gm Fluid: per MD  Skin: chest surgical incision   Diet Order: NPO   Intake/Output Summary (Last 24 hours) at 10/13/13 1546 Last data filed at 10/13/13 1200  Gross per 24 hour  Intake 825.28 ml  Output   3405 ml  Net -2579.72 ml    Labs:   Recent Labs Lab 10/09/13 1836 10/10/13 0350 10/10/13 1629  10/11/13 0400 10/12/13 0415 10/13/13 0350  NA 137 138  --   < > 136* 133* 137  K 4.2 3.9  --   < > 4.1 3.9 4.0  CL 104 104  --   < > 102 97 99  CO2  --  24  --   --  23 24 26   BUN 15 16  --   < > 18 22 20   CREATININE 0.50 0.59 0.75  < > 0.64 0.83 0.74  CALCIUM  --  8.6  --   --  8.7 8.4 8.3*  MG  --  2.0 1.8  --   --   --   --   GLUCOSE 108* 130*  --   < > 93 123* 114*  < > = values in this interval  not displayed.  CBG (last 3)   Recent Labs  10/12/13 2150 10/13/13 0728 10/13/13 1127  GLUCAP 184* 129* 147*    Scheduled Meds: . acetaminophen  1,000 mg Oral 4 times per day   Or  . acetaminophen (TYLENOL) oral liquid 160 mg/5 mL  1,000 mg Per Tube 4 times per day  . albumin human  12.5 g Intravenous Once  . amiodarone  400 mg Oral BID  . antiseptic oral rinse  7 mL Mouth Rinse QID  . aspirin EC  325 mg Oral Daily   Or  . aspirin  324 mg Per Tube Daily  . atorvastatin  80 mg Oral Daily  . bisacodyl  10 mg Oral Daily   Or  . bisacodyl  10 mg Rectal Daily  . carvedilol  3.125 mg Oral BID WC  . docusate sodium  200 mg Oral Daily  . enoxaparin (LOVENOX) injection  30 mg Subcutaneous QHS  . furosemide  40 mg Intravenous BID  . insulin aspart  0-15 Units Subcutaneous TID WC  . levothyroxine  112 mcg Oral QAC breakfast  . metFORMIN  1,000 mg Oral BID WC  . oxybutynin  30 mg Oral Daily  . pantoprazole  40 mg Oral Daily  . potassium chloride  20 mEq Oral BID  . sodium chloride  10 mL Intravenous Q12H  . sodium chloride  3 mL Intravenous Q12H    Continuous Infusions: . sodium chloride Stopped (10/10/13 1000)  . sodium chloride Stopped (10/10/13 1000)  . sodium chloride Stopped (10/12/13 2000)  . dexmedetomidine Stopped (10/09/13 1800)  . DOPamine Stopped (10/11/13 0200)  . lactated ringers Stopped (10/10/13 1000)  . nitroGLYCERIN Stopped (10/09/13 1745)  . phenylephrine (NEO-SYNEPHRINE) Adult infusion 30 mcg/min (10/13/13 0000)    Past Medical History  Diagnosis Date  . DM2 (diabetes mellitus, type 2)   . Hypertension   . Hyperlipidemia   . GERD (gastroesophageal reflux disease)   . Osteopenia   . CAD (coronary artery disease), native coronary artery 10/06/2013    LAD 90%, CFX 90%, RCA 100% w/ collat  . Ischemic dilated cardiomyopathy 09/2013    EF 20-25% by echo  . Chronic combined systolic and diastolic CHF, NYHA class 2 09/2013  . NSVT (nonsustained  ventricular tachycardia) 09/2013  . OAB (overactive bladder)   . Hypothyroidism   . CVA (cerebral vascular accident) 1968    leaving no deficit  . Arthritis     "knees" (10/08/2013)  . Breast cancer     "left; I had 62 sessions of radiation"    Past Surgical History  Procedure Laterality Date  . Colonoscopy  2005  . Cardiac catheterization  10/06/2013  . Cataract extraction w/ intraocular lens  implant, bilateral Bilateral ~ 2009  . Tonsillectomy and adenoidectomy  1930's  . Breast lumpectomy Left 1998  . Breast lumpectomy with axillary lymph node dissection Left 1998  . Coronary artery bypass graft [name] 10/09/2013    Procedure: CORONARY ARTERY BYPASS GRAFTING (CABG) x 4 using left internal mammary artery and right saphenous leg vein using endoscope.;  Surgeon: Melrose Nakayama, MD;  Location: [facility];  Service: Open Heart Surgery;  Laterality: [name];  . Intraoperative transesophageal echocardiogram [name] 10/09/2013    Procedure: INTRAOPERATIVE TRANSESOPHAGEAL ECHOCARDIOGRAM;  Surgeon: Melrose Nakayama, MD;  Location: [place];  Service: Open Heart Surgery;  Laterality: [name];    Arthur Holms, RD, LDN Pager #: [phone] After-Hours Pager #: [phone]

## 2013-10-13 NOTE — Progress Notes (Signed)
CSW provided pt with bed offers. Pt has accepted placement at [facility].  CSW to remain following for placement.  Hunt Oris, MSW, [place]

## 2013-10-13 NOTE — Evaluation (Signed)
Physical Therapy Evaluation Patient Details Name: Marissa Jefferson MRN: [id number] DOB: 1924/09/13 Today's Date: 10/13/2013   History of Present Illness  Pt s/p CABG x 4 on 7/31. Pt with recent dx of CHF. PMH - HTN, DM.  Clinical Impression  Pt admitted with above. Pt currently with functional limitations due to the deficits listed below (see PT Problem List).  Pt will benefit from skilled PT to increase their independence and safety with mobility to allow discharge to the venue listed below. Pt lives alone and will need ST-SNF prior to return.      Follow Up Recommendations SNF    Equipment Recommendations  Other (comment) (rollator)    Recommendations for Other Services       Precautions / Restrictions Precautions Precautions: Sternal;Fall Restrictions Weight Bearing Restrictions: Yes (Sternal precautions)      Mobility  Bed Mobility               General bed mobility comments: Pt up in chair on arrival.  Transfers Overall transfer level: Needs assistance Equipment used: Pushed w/c Transfers: Sit to/from Stand Sit to Stand: +2 physical assistance;Mod assist         General transfer comment: Verbal cues not to use UE's. Assist to bring hips up.  Ambulation/Gait Ambulation/Gait assistance: +2 safety/equipment;Min assist Ambulation Distance (Feet): 150 Feet Assistive device:  (pushing w/c) Gait Pattern/deviations: Step-through pattern;Decreased step length - right;Decreased step length - left;Trunk flexed Gait velocity: decr Gait velocity interpretation: Below normal speed for age/gender General Gait Details: Verbal cues to look up and stand more erect. Pt stopped every 15 feet to stand and rest and catch her breath.  Stairs            Wheelchair Mobility    Modified Rankin (Stroke Patients Only)       Balance Overall balance assessment: Needs assistance Sitting-balance support: No upper extremity supported;Feet supported Sitting balance-Leahy  Scale: Fair     Standing balance support: Bilateral upper extremity supported Standing balance-Leahy Scale: Poor                               Pertinent Vitals/Pain SaO2 > 96% on 3L O2 with amb. Dyspnea 3/4    Home Living Family/patient expects to be discharged to:: Skilled nursing facility Living Arrangements: Alone   Type of Home: Apartment Home Access: Stairs to enter Entrance Stairs-Rails: Right Entrance Stairs-Number of Steps: 17 Home Layout: One level        Prior Function Level of Independence: Independent               Hand Dominance        Extremity/Trunk Assessment   Upper Extremity Assessment: Generalized weakness           Lower Extremity Assessment: Generalized weakness         Communication   Communication: No difficulties  Cognition Arousal/Alertness: Awake/alert Behavior During Therapy: WFL for tasks assessed/performed Overall Cognitive Status: Within Functional Limits for tasks assessed                      General Comments      Exercises        Assessment/Plan    PT Assessment Patient needs continued PT services  PT Diagnosis Difficulty walking;Generalized weakness   PT Problem List Decreased strength;Decreased activity tolerance;Decreased balance;Decreased mobility;Decreased knowledge of use of DME;Decreased knowledge of precautions  PT Treatment Interventions DME instruction;Gait training;Functional mobility  training;Therapeutic activities;Therapeutic exercise;Balance training;Patient/family education   PT Goals (Current goals can be found in the Care Plan section) Acute Rehab PT Goals Patient Stated Goal: Return to being independent. PT Goal Formulation: With patient Time For Goal Achievement: 10/27/13 Potential to Achieve Goals: Good    Frequency Min 3X/week   Barriers to discharge Decreased caregiver support      Co-evaluation               End of Session Equipment Utilized During  Treatment: Gait belt;Oxygen Activity Tolerance: Patient tolerated treatment well Patient left: in chair;with call bell/phone within reach Nurse Communication: Mobility status         Time: 4193-7902 PT Time Calculation (min): 22 min   Charges:   PT Evaluation $Initial PT Evaluation Tier I: 1 Procedure PT Treatments $Gait Training: 8-22 mins   PT G Codes:          Marissa Jefferson 11-12-2013, 9:46 AM  [facility] PT [phone]

## 2013-10-13 NOTE — Progress Notes (Addendum)
Clinical Social Work Department CLINICAL SOCIAL WORK PLACEMENT NOTE 10/13/2013  Patient:  Marissa Jefferson, Marissa Jefferson  Account Number:  [id number] Admit date:  10/06/2013  Clinical Social Worker:  Hunt Oris, Latanya Presser  Date/time:  10/13/2013 11:36 AM  Clinical Social Work is seeking post-discharge placement for this patient at the following level of care:   [place]   (*CSW will update this form in Epic as items are completed)   10/13/2013  Patient/family provided with [place] Department of Clinical Social Work's list of facilities offering this level of care within the geographic area requested by the patient (or if unable, by the patient's family).  10/13/2013  Patient/family informed of their freedom to choose among providers that offer the needed level of care, that participate in Medicare, Medicaid or managed care program needed by the patient, have an available bed and are willing to accept the patient.  10/13/2013  Patient/family informed of [facility]' ownership interest in [facility], as well as of the fact that they are under no obligation to receive care at this facility.  PASARR submitted to EDS on 10/13/2013 PASARR number received on   FL2 transmitted to all facilities in geographic area requested by pt/family on  10/13/2013 FL2 transmitted to all facilities within larger geographic area on 10/13/2013  Patient informed that his/her managed care company has contracts with or will negotiate with  certain facilities, including the following:     Patient/family informed of bed offers received:  10/13/13 Patient chooses bed at [facility] Physician recommends and patient chooses bed at    Patient to be transferred to  on   Patient to be transferred to facility by  Patient and family notified of transfer on  Name of family member notified:    The following physician request were entered in Epic:   Additional Comments: Hunt Oris, MSW, [place]

## 2013-10-13 NOTE — Progress Notes (Signed)
TCTS BRIEF SICU PROGRESS NOTE  4 Days Post-Op  S/P Procedure(s) (LRB): CORONARY ARTERY BYPASS GRAFTING (CABG) x 4 using left internal mammary artery and right saphenous leg vein using endoscope. ([name]) INTRAOPERATIVE TRANSESOPHAGEAL ECHOCARDIOGRAM ([name])   Stable day NSR w/ BP stable on Neo @ 40 mcg/min, milrinone off O2 sats 98-100% on O2 2 L/min via  Diuresing well  Plan: Continue current plan  Tiahna Cure H 10/13/2013 4:30 PM

## 2013-10-14 LAB — BASIC METABOLIC PANEL
ANION GAP: 11 (ref 5–15)
BUN: 23 mg/dL (ref 6–23)
CHLORIDE: 99 meq/L (ref 96–112)
CO2: 26 meq/L (ref 19–32)
CREATININE: 0.69 mg/dL (ref 0.50–1.10)
Calcium: 8.3 mg/dL — ABNORMAL LOW (ref 8.4–10.5)
GFR calc Af Amer: 89 mL/min — ABNORMAL LOW (ref >90)
GFR calc non Af Amer: 77 mL/min — ABNORMAL LOW (ref >90)
GLUCOSE: 153 mg/dL — AB (ref 70–99)
Potassium: 4.8 mEq/L (ref 3.7–5.3)
Sodium: 136 mEq/L — ABNORMAL LOW (ref 137–147)

## 2013-10-14 LAB — CBC
HCT: 19.7 % — ABNORMAL LOW (ref 36.0–46.0)
HEMATOCRIT: 27 % — AB (ref 36.0–46.0)
HEMOGLOBIN: 8.9 g/dL — AB (ref 12.0–15.0)
Hemoglobin: 6.4 g/dL — CL (ref 12.0–15.0)
MCH: 32.1 pg (ref 26.0–34.0)
MCH: 31.8 pg (ref 26.0–34.0)
MCHC: 33 g/dL (ref 30.0–36.0)
MCHC: 32.5 g/dL (ref 30.0–36.0)
MCV: 97.5 fL (ref 78.0–100.0)
MCV: 98 fL (ref 78.0–100.0)
Platelets: 178 10*3/uL (ref 150–400)
Platelets: 257 10*3/uL (ref 150–400)
RBC: 2.01 MIL/uL — ABNORMAL LOW (ref 3.87–5.11)
RBC: 2.77 MIL/uL — ABNORMAL LOW (ref 3.87–5.11)
RDW: 13 % (ref 11.5–15.5)
RDW: 12.9 % (ref 11.5–15.5)
WBC: 10.4 10*3/uL (ref 4.0–10.5)
WBC: 7.7 10*3/uL (ref 4.0–10.5)

## 2013-10-14 LAB — GLUCOSE, CAPILLARY
GLUCOSE-CAPILLARY: 152 mg/dL — AB (ref 70–99)
GLUCOSE-CAPILLARY: 154 mg/dL — AB (ref 70–99)
Glucose-Capillary: 143 mg/dL — ABNORMAL HIGH (ref 70–99)
Glucose-Capillary: 127 mg/dL — ABNORMAL HIGH (ref 70–99)

## 2013-10-14 LAB — CARBOXYHEMOGLOBIN
CARBOXYHEMOGLOBIN: 1.3 % (ref 0.5–1.5)
Methemoglobin: 0.9 % (ref 0.0–1.5)
O2 Saturation: 48.7 %
Total hemoglobin: 8.8 g/dL — ABNORMAL LOW (ref 12.0–16.0)

## 2013-10-14 LAB — PRO B NATRIURETIC PEPTIDE: Pro B Natriuretic peptide (BNP): 2044 pg/mL — ABNORMAL HIGH (ref 0–450)

## 2013-10-14 MED ORDER — FUROSEMIDE 10 MG/ML IJ SOLN: 40.0000 mg | Freq: Every day | INTRAMUSCULAR | Status: DC

## 2013-10-14 MED ORDER — FUROSEMIDE 10 MG/ML IJ SOLN
40.0000 mg | Freq: Every day | INTRAMUSCULAR | Status: DC
  Administered 2013-10-14: 40 mg via INTRAVENOUS
  Filled 2013-10-14: qty 4

## 2013-10-14 NOTE — Progress Notes (Signed)
5 Days Post-Op Procedure(s) (LRB): CORONARY ARTERY BYPASS GRAFTING (CABG) x 4 using left internal mammary artery and right saphenous leg vein using endoscope. ([name]) INTRAOPERATIVE TRANSESOPHAGEAL ECHOCARDIOGRAM ([name]) Subjective: "I think I'm getting better"  Objective: Vital signs in last 24 hours: Temp:  [97.5 F (36.4 C)-99.2 F (37.3 C)] 97.5 F (36.4 C) (08/05 0740) Pulse Rate:  [44-76] 60 (08/05 0800) Cardiac Rhythm:  [-] Atrial paced (08/05 0800) Resp:  [14-24] 15 (08/05 0800) BP: (94-142)/(45-65) 126/63 mmHg (08/05 0800) SpO2:  [82 %-100 %] 99 % (08/05 0800) Weight:  [173 lb 1 oz (78.5 kg)] 173 lb 1 oz (78.5 kg) (08/05 0500)  Hemodynamic parameters for last 24 hours:    Intake/Output from previous day: 08/04 0701 - 08/05 0700 In: 1431.3 [P.O.:600; I.V.:831.3] Out: 1835 [EXNTZ:0017] Intake/Output this shift: Total I/O In: 42.5 [I.V.:42.5] Out: 120 [Urine:120]  General appearance: alert, cooperative and no distress Neurologic: intact Heart: regular rate and rhythm Lungs: clear to auscultation bilaterally Wound: clean and dry  Lab Results:  Recent Labs  10/14/13 0350 10/14/13 0419  WBC 7.7 10.4  HGB 6.4* 8.9*  HCT 19.7* 27.0*  PLT 178 257   BMET:  Recent Labs  10/13/13 0350 10/14/13 0419  NA 137 136*  K 4.0 4.8  CL 99 99  CO2 26 26  GLUCOSE 114* 153*  BUN 20 23  CREATININE 0.74 0.69  CALCIUM 8.3* 8.3*    PT/INR: No results found for this basename: LABPROT, INR,  in the last 72 hours ABG    Component Value Date/Time   PHART 7.442 10/10/2013 0904   HCO3 24.0 10/10/2013 0904   TCO2 22 10/10/2013 1642   ACIDBASEDEF 1.0 10/09/2013 2217   O2SAT 52.2 10/13/2013 0900   CBG (last 3)   Recent Labs  10/13/13 1127 10/13/13 1759 10/13/13 2143  GLUCAP 147* 165* 160*    Assessment/Plan: S/P Procedure(s) (LRB): CORONARY ARTERY BYPASS GRAFTING (CABG) x 4 using left internal mammary artery and right saphenous leg vein using endoscope. ([name]) INTRAOPERATIVE  TRANSESOPHAGEAL ECHOCARDIOGRAM ([name]) - CV- s/p CABg, ischemic cardiomyopathy  Off milrinone- check co-ox  Wean neo as BP tolerates  BNP elevated- continue diuresis  RESP_ IS  RENAL_ continue diuresis- still well above preop weight  Lytes and creatinine OK  ENDo- on metformin, SSI PRN  Enoxaparin + SCD for DVT prophylaxis  Deconditioning- working with PT    LOS: 8 days    HENDRICKSON,STEVEN C 10/14/2013

## 2013-10-14 NOTE — Progress Notes (Signed)
Patient ID: Marissa Jefferson, female   DOB: October 14, 1924, 80 y.o.   MRN: [id number]  SICU Evening Rounds:  Hemodynamically stable off neo. 143/60.  Sinus low 60's with AAI backup  Co-ox 48% this afternoon  She is diuresing.

## 2013-10-15 ENCOUNTER — Encounter: Payer: Self-pay | Admitting: Cardiovascular Disease

## 2013-10-15 ENCOUNTER — Inpatient Hospital Stay (HOSPITAL_COMMUNITY): Payer: Medicare Other

## 2013-10-15 LAB — BASIC METABOLIC PANEL
Anion gap: 10 (ref 5–15)
BUN: 25 mg/dL — AB (ref 6–23)
CO2: 26 mEq/L (ref 19–32)
CREATININE: 0.78 mg/dL (ref 0.50–1.10)
Calcium: 8.6 mg/dL (ref 8.4–10.5)
Chloride: 97 mEq/L (ref 96–112)
GFR calc non Af Amer: 74 mL/min — ABNORMAL LOW (ref >90)
GFR, EST AFRICAN AMERICAN: 85 mL/min — AB (ref >90)
Glucose, Bld: 138 mg/dL — ABNORMAL HIGH (ref 70–99)
Potassium: 5.3 mEq/L (ref 3.7–5.3)
Sodium: 133 mEq/L — ABNORMAL LOW (ref 137–147)

## 2013-10-15 LAB — CBC
HEMATOCRIT: 27.1 % — AB (ref 36.0–46.0)
Hemoglobin: 8.8 g/dL — ABNORMAL LOW (ref 12.0–15.0)
MCH: 31.8 pg (ref 26.0–34.0)
MCHC: 32.5 g/dL (ref 30.0–36.0)
MCV: 97.8 fL (ref 78.0–100.0)
Platelets: 268 10*3/uL (ref 150–400)
RBC: 2.77 MIL/uL — ABNORMAL LOW (ref 3.87–5.11)
RDW: 12.8 % (ref 11.5–15.5)
WBC: 9.9 10*3/uL (ref 4.0–10.5)

## 2013-10-15 LAB — GLUCOSE, CAPILLARY
GLUCOSE-CAPILLARY: 141 mg/dL — AB (ref 70–99)
GLUCOSE-CAPILLARY: 134 mg/dL — AB (ref 70–99)
GLUCOSE-CAPILLARY: 153 mg/dL — AB (ref 70–99)
Glucose-Capillary: 143 mg/dL — ABNORMAL HIGH (ref 70–99)

## 2013-10-15 MED ORDER — GUAIFENESIN-DM 100-10 MG/5ML PO SYRP: 15.0000 mL | ORAL_SOLUTION | ORAL | Status: DC | PRN

## 2013-10-15 MED ORDER — MOVING RIGHT ALONG BOOK
Freq: Once | Status: AC
  Administered 2013-10-16: 17:00:00
  Filled 2013-10-15: qty 1

## 2013-10-15 MED ORDER — FUROSEMIDE 10 MG/ML IJ SOLN
40.0000 mg | Freq: Every day | INTRAMUSCULAR | Status: DC
  Administered 2013-10-15 – 2013-10-17 (×3): 40 mg via INTRAVENOUS
  Filled 2013-10-15 (×3): qty 4

## 2013-10-15 MED ORDER — LISINOPRIL 20 MG PO TABS
20.0000 mg | ORAL_TABLET | Freq: Every day | ORAL | Status: DC
  Administered 2013-10-15 – 2013-10-17 (×3): 20 mg via ORAL
  Filled 2013-10-15 (×4): qty 1

## 2013-10-15 MED ORDER — DIGOXIN 125 MCG PO TABS
0.1250 mg | ORAL_TABLET | Freq: Every day | ORAL | Status: DC
  Administered 2013-10-15 – 2013-10-17 (×3): 0.125 mg via ORAL
  Filled 2013-10-15 (×4): qty 1

## 2013-10-15 MED ORDER — HYDROCHLOROTHIAZIDE 12.5 MG PO CAPS
12.5000 mg | ORAL_CAPSULE | Freq: Every day | ORAL | Status: DC
  Administered 2013-10-15 – 2013-10-17 (×2): 12.5 mg via ORAL
  Filled 2013-10-15 (×4): qty 1

## 2013-10-15 MED ORDER — ALUM & MAG HYDROXIDE-SIMETH 200-200-20 MG/5ML PO SUSP: 15.0000 mL | ORAL | Status: DC | PRN

## 2013-10-15 MED ORDER — SODIUM CHLORIDE 0.9 % IJ SOLN
3.0000 mL | Freq: Two times a day (BID) | INTRAMUSCULAR | Status: DC
Start: 2013-10-15 — End: 2013-10-21
  Administered 2013-10-15 – 2013-10-21 (×12): 3 mL via INTRAVENOUS

## 2013-10-15 MED ORDER — AMIODARONE HCL 200 MG PO TABS
200.0000 mg | ORAL_TABLET | Freq: Two times a day (BID) | ORAL | Status: DC
  Administered 2013-10-15 – 2013-10-16 (×3): 200 mg via ORAL
  Filled 2013-10-15 (×5): qty 1

## 2013-10-15 MED ORDER — FUROSEMIDE 10 MG/ML IJ SOLN: 40.0000 mg | Freq: Two times a day (BID) | INTRAMUSCULAR | Status: DC

## 2013-10-15 MED ORDER — SODIUM CHLORIDE 0.9 % IV SOLN: 250.0000 mL | INTRAVENOUS | Status: DC | PRN

## 2013-10-15 MED ORDER — MAGNESIUM HYDROXIDE 400 MG/5ML PO SUSP: 30.0000 mL | Freq: Every day | ORAL | Status: DC | PRN

## 2013-10-15 MED ORDER — LISINOPRIL-HYDROCHLOROTHIAZIDE 20-12.5 MG PO TABS: 2.0000 | ORAL_TABLET | Freq: Every day | ORAL | Status: DC

## 2013-10-15 MED ORDER — SODIUM CHLORIDE 0.9 % IJ SOLN
3.0000 mL | INTRAMUSCULAR | Status: DC | PRN
Start: 2013-10-15 — End: 2013-10-21

## 2013-10-15 NOTE — Progress Notes (Signed)
6 Days Post-Op Procedure(s) (LRB): CORONARY ARTERY BYPASS GRAFTING (CABG) x 4 using left internal mammary artery and right saphenous leg vein using endoscope. ([name]) INTRAOPERATIVE TRANSESOPHAGEAL ECHOCARDIOGRAM ([name]) Subjective: Says she feels a little better each day  Objective: Vital signs in last 24 hours: Temp:  [97.4 F (36.3 C)-98.1 F (36.7 C)] 97.8 F (36.6 C) (08/06 0743) Pulse Rate:  [59-74] 63 (08/06 0841) Cardiac Rhythm:  [-] Normal sinus rhythm (08/05 2129) Resp:  [13-28] 13 (08/06 0700) BP: (107-143)/(45-95) 139/57 mmHg (08/06 0841) SpO2:  [91 %-100 %] 96 % (08/06 0700) Weight:  [173 lb 4.5 oz (78.6 kg)] 173 lb 4.5 oz (78.6 kg) (08/06 0600)  Hemodynamic parameters for last 24 hours:    Intake/Output from previous day: 08/05 0701 - 08/06 0700 In: 663.8 [P.O.:560; I.V.:103.8] Out: 1290 [Urine:1290] Intake/Output this shift:    General appearance: alert and no distress Neurologic: intact Heart: regular rate and rhythm Lungs: diminished breath sounds bibasilar Wound: clean and dry  Lab Results:  Recent Labs  10/14/13 0419 10/15/13 0303  WBC 10.4 9.9  HGB 8.9* 8.8*  HCT 27.0* 27.1*  PLT 257 268   BMET:  Recent Labs  10/14/13 0419 10/15/13 0303  NA 136* 133*  K 4.8 5.3  CL 99 97  CO2 26 26  GLUCOSE 153* 138*  BUN 23 25*  CREATININE 0.69 0.78  CALCIUM 8.3* 8.6    PT/INR: No results found for this basename: LABPROT, INR,  in the last 72 hours ABG    Component Value Date/Time   PHART 7.442 10/10/2013 0904   HCO3 24.0 10/10/2013 0904   TCO2 22 10/10/2013 1642   ACIDBASEDEF 1.0 10/09/2013 2217   O2SAT 48.7 10/14/2013 1250   CBG (last 3)   Recent Labs  10/14/13 1235 10/14/13 1720 10/14/13 2134  GLUCAP 143* 127* 154*    Assessment/Plan: S/P Procedure(s) (LRB): CORONARY ARTERY BYPASS GRAFTING (CABG) x 4 using left internal mammary artery and right saphenous leg vein using endoscope. ([name]) INTRAOPERATIVE TRANSESOPHAGEAL ECHOCARDIOGRAM  ([name]) Plan for transfer to step-down: see transfer orders  CV- stable, will restart lisinopril HCTZ, will also try adding digoxin  RESP- continue IS  RENAL- lytes and creatinine OK  Weight still up significantly but does not appear that overloaded clinically  ENDO- CBG OK  Deconditioning- continue ambulation   LOS: 9 days    Avice Funchess C 10/15/2013

## 2013-10-15 NOTE — Progress Notes (Signed)
Physical Therapy Treatment Patient Details Name: Marissa Jefferson MRN: [id number] DOB: 08-28-1924 Today's Date: 11-04-13    History of Present Illness Pt s/p CABG x 4 on 7/31. Pt with recent dx of CHF. PMH - HTN, DM.    PT Comments    Pt making slow, steady progress.  Follow Up Recommendations  SNF     Equipment Recommendations  Other (comment) (rollator)    Recommendations for Other Services       Precautions / Restrictions Precautions Precautions: Sternal;Fall    Mobility  Bed Mobility Overal bed mobility: Needs Assistance Bed Mobility: Sit to Supine       Sit to supine: Min assist   General bed mobility comments: Assist to bring legs up into bed.  Transfers Overall transfer level: Needs assistance Equipment used: 2 person hand held assist Transfers: Sit to/from Stand Sit to Stand: Mod assist         General transfer comment: Verbal cues not to use UE's. Assist to bring hips up.  Ambulation/Gait Ambulation/Gait assistance: Min assist Ambulation Distance (Feet): 150 Feet Assistive device:  (pushing w/c) Gait Pattern/deviations: Step-through pattern;Decreased step length - right;Decreased step length - left;Trunk flexed Gait velocity: decr Gait velocity interpretation: Below normal speed for age/gender General Gait Details: Verbal cues to look up and stand more erect. Pt stopped 3 time to stand and rest.   Stairs            Wheelchair Mobility    Modified Rankin (Stroke Patients Only)       Balance Overall balance assessment: Needs assistance Sitting-balance support: No upper extremity supported Sitting balance-Leahy Scale: Good     Standing balance support: Bilateral upper extremity supported Standing balance-Leahy Scale: Poor                      Cognition Arousal/Alertness: Awake/alert Behavior During Therapy: WFL for tasks assessed/performed Overall Cognitive Status: Within Functional Limits for tasks assessed                       Exercises      General Comments        Pertinent Vitals/Pain Pain Assessment: No/denies pain    Home Living                      Prior Function            PT Goals (current goals can now be found in the care plan section) Progress towards PT goals: Progressing toward goals    Frequency  Min 3X/week    PT Plan Current plan remains appropriate    Co-evaluation             End of Session Equipment Utilized During Treatment: Oxygen Activity Tolerance: Patient tolerated treatment well Patient left: in bed;with call bell/phone within reach     Time: 1219-1247 PT Time Calculation (min): 28 min  Charges:  $Gait Training: 23-37 mins                    G Codes:      Marissa Jefferson 11-04-13, 3:56 PM  [employer] PT [phone]

## 2013-10-15 NOTE — Progress Notes (Signed)
TCTS BRIEF SICU PROGRESS NOTE  6 Days Post-Op  S/P Procedure(s) (LRB): CORONARY ARTERY BYPASS GRAFTING (CABG) x 4 using left internal mammary artery and right saphenous leg vein using endoscope. ([name]) INTRAOPERATIVE TRANSESOPHAGEAL ECHOCARDIOGRAM ([name])   Stable day. Ambulated some. AAI paced.  BP stable Awaiting bed on step down unit for transfer  Plan: Continue current plan  OWEN,CLARENCE H 10/15/2013 6:03 PM

## 2013-10-15 NOTE — Progress Notes (Signed)
Patient up to bedside commode. First post catheter removal void = 200 ml. Dorthey Sawyer, RN

## 2013-10-15 NOTE — Progress Notes (Signed)
Pt. Continues to be unable to void past catheter removal.  Bladder scan = 216 ml. Encouraged po fluid intake. Will continue to monitor. Dorthey Sawyer, RN

## 2013-10-16 ENCOUNTER — Inpatient Hospital Stay (HOSPITAL_COMMUNITY): Payer: Medicare Other

## 2013-10-16 LAB — URINALYSIS, ROUTINE W REFLEX MICROSCOPIC
BILIRUBIN URINE: NEGATIVE
Glucose, UA: NEGATIVE mg/dL
HGB URINE DIPSTICK: NEGATIVE
KETONES UR: NEGATIVE mg/dL
Leukocytes, UA: NEGATIVE
NITRITE: NEGATIVE
PROTEIN: NEGATIVE mg/dL
SPECIFIC GRAVITY, URINE: 1.009 (ref 1.005–1.030)
UROBILINOGEN UA: 1 mg/dL (ref 0.0–1.0)
pH: 5.5 (ref 5.0–8.0)

## 2013-10-16 LAB — CBC
HEMATOCRIT: 28.2 % — AB (ref 36.0–46.0)
Hemoglobin: 9.4 g/dL — ABNORMAL LOW (ref 12.0–15.0)
MCH: 32.4 pg (ref 26.0–34.0)
MCHC: 33.3 g/dL (ref 30.0–36.0)
MCV: 97.2 fL (ref 78.0–100.0)
Platelets: 307 10*3/uL (ref 150–400)
RBC: 2.9 MIL/uL — AB (ref 3.87–5.11)
RDW: 12.9 % (ref 11.5–15.5)
WBC: 10.2 10*3/uL (ref 4.0–10.5)

## 2013-10-16 LAB — GLUCOSE, CAPILLARY
GLUCOSE-CAPILLARY: 130 mg/dL — AB (ref 70–99)
GLUCOSE-CAPILLARY: 126 mg/dL — AB (ref 70–99)
Glucose-Capillary: 125 mg/dL — ABNORMAL HIGH (ref 70–99)
Glucose-Capillary: 140 mg/dL — ABNORMAL HIGH (ref 70–99)

## 2013-10-16 LAB — BASIC METABOLIC PANEL
ANION GAP: 11 (ref 5–15)
BUN: 21 mg/dL (ref 6–23)
CALCIUM: 8.8 mg/dL (ref 8.4–10.5)
CO2: 26 meq/L (ref 19–32)
CREATININE: 0.69 mg/dL (ref 0.50–1.10)
Chloride: 95 mEq/L — ABNORMAL LOW (ref 96–112)
GFR calc Af Amer: 89 mL/min — ABNORMAL LOW (ref >90)
GFR calc non Af Amer: 77 mL/min — ABNORMAL LOW (ref >90)
GLUCOSE: 123 mg/dL — AB (ref 70–99)
Potassium: 5.1 mEq/L (ref 3.7–5.3)
Sodium: 132 mEq/L — ABNORMAL LOW (ref 137–147)

## 2013-10-16 NOTE — Progress Notes (Signed)
Received to room 2W23 via wheelchair. External pacer set at VVI at 60. Call bell in reach.

## 2013-10-16 NOTE — Progress Notes (Signed)
7 Days Post-Op Procedure(s) (LRB): CORONARY ARTERY BYPASS GRAFTING (CABG) x 4 using left internal mammary artery and right saphenous leg vein using endoscope. ([name]) INTRAOPERATIVE TRANSESOPHAGEAL ECHOCARDIOGRAM ([name]) Subjective: C/o urinating frequently  Objective: Vital signs in last 24 hours: Temp:  [97.5 F (36.4 C)-98.6 F (37 C)] 97.5 F (36.4 C) (08/07 0747) Pulse Rate:  [40-71] 70 (08/07 0700) Cardiac Rhythm:  [-] Atrial paced (08/07 0700) Resp:  [12-21] 13 (08/07 0700) BP: (94-153)/(40-132) 137/61 mmHg (08/07 0700) SpO2:  [93 %-100 %] 97 % (08/07 0700) Weight:  [169 lb 12.1 oz (77 kg)] 169 lb 12.1 oz (77 kg) (08/07 0500)  Hemodynamic parameters for last 24 hours:    Intake/Output from previous day: 08/06 0701 - 08/07 0700 In: 400 [P.O.:400] Out: 700 [Urine:700] Intake/Output this shift:    General appearance: alert and no distress Neurologic: intact Heart: slightly irregular Lungs: diminished breath sounds left > right base Extremities: edema 1+ Wound: clean and dry  Lab Results:  Recent Labs  10/15/13 0303 10/16/13 0312  WBC 9.9 10.2  HGB 8.8* 9.4*  HCT 27.1* 28.2*  PLT 268 307   BMET:  Recent Labs  10/15/13 0303 10/16/13 0312  NA 133* 132*  K 5.3 5.1  CL 97 95*  CO2 26 26  GLUCOSE 138* 123*  BUN 25* 21  CREATININE 0.78 0.69  CALCIUM 8.6 8.8    PT/INR: No results found for this basename: LABPROT, INR,  in the last 72 hours ABG    Component Value Date/Time   PHART 7.442 10/10/2013 0904   HCO3 24.0 10/10/2013 0904   TCO2 22 10/10/2013 1642   ACIDBASEDEF 1.0 10/09/2013 2217   O2SAT 48.7 10/14/2013 1250   CBG (last 3)   Recent Labs  10/15/13 1202 10/15/13 1659 10/15/13 2131  GLUCAP 134* 143* 153*    Assessment/Plan: S/P Procedure(s) (LRB): CORONARY ARTERY BYPASS GRAFTING (CABG) x 4 using left internal mammary artery and right saphenous leg vein using endoscope. ([name]) INTRAOPERATIVE TRANSESOPHAGEAL ECHOCARDIOGRAM ([name]) Plan for transfer  to step-down: see transfer orders  Awaiting PTCu bed  CV- s/p CABG x 4, ischemic cardiomyopathy (EF=20%)  On low dose coreg, amiodarone and digoxin  HR ~60 in SR with PVCs and PACs  On ACE-I  RESP- stable, CXR improved, still has a small left effusion  RENAL- lytes, creatinine OK  Frequent urination- likely just diuretics, check UA to r/o UTI  ENDO- CBG well controlled  Deconditioning- continue ambulation/ PT   LOS: 10 days    Julene Rahn C 10/16/2013

## 2013-10-17 LAB — GLUCOSE, CAPILLARY
GLUCOSE-CAPILLARY: 124 mg/dL — AB (ref 70–99)
GLUCOSE-CAPILLARY: 125 mg/dL — AB (ref 70–99)
Glucose-Capillary: 141 mg/dL — ABNORMAL HIGH (ref 70–99)
Glucose-Capillary: 119 mg/dL — ABNORMAL HIGH (ref 70–99)

## 2013-10-17 MED ORDER — AMIODARONE HCL 200 MG PO TABS
200.0000 mg | ORAL_TABLET | Freq: Every day | ORAL | Status: DC
  Administered 2013-10-17 – 2013-10-21 (×5): 200 mg via ORAL
  Filled 2013-10-17 (×5): qty 1

## 2013-10-17 NOTE — Progress Notes (Signed)
Pt refused to ambulate this evening. She states that she just ambulated prior to change of shift and then went to sleep early. I have reinforced the importance of increasing her activity level and to ambulate in hall at least tid. She verb understanding, but will need encouragement.

## 2013-10-17 NOTE — Discharge Summary (Signed)
Physician Discharge Summary       [facility] [address]       [place] [address]             [phone]    Patient ID: Marissa Jefferson MRN: [id number] DOB/AGE: [birth year] 80 y.o.  Admit date: 10/06/2013 Discharge date: 10/18/2013  Admission Diagnoses: 1. Multivessel CAD Congestive Heart Failure- chronic systolic and diastolic compensated 2. History of ischemic cardiomyopathy 3. History of hypertension 4. History of hyperlipidemia 5. History of DM type II 6 History of CVA 7. History of GERD 8. History of OAB 9. History of left breast cancer  Discharge Diagnoses:  1. Multivessel CAD Congestive Heart Failure- chronic systolic and diastolic compensated 2. History of ischemic cardiomyopathy 3. History of hypertension 4. History of hyperlipidemia 5. History of DM type II 6 History of CVA 7. History of GERD 8. History of OAB 9. History of left breast cancer   Procedure (s):  1. Cardiac catheterization done by Dr. Sallyanne Kuster on 10/06/2013: Cardiac output is 5.4 L per minute (cardiac index 2.7 L per minute per meter sq)  Ao sat95%  PA sat 70%  Angiographic Findings:  All the coronary arteries are moderate to severely calcified  1. The left main coronary artery is extremely short. The LAD and circumflex have essentially separate ostia.  2. The left anterior descending artery is a large vessel that reaches the apex and generates three diagonal branches. There is evidence of extensiveirregularities and severe calcification. There is a 70% ostial stenosis, a 60-70% proximal stenosis, a 90% mid stenosis and a 80% distal stenosis  3. The left circumflex coronary artery is a medium-size vessel non dominant vessel that generates two major oblique marginal arteries. There is evidence of extensive luminal irregularities and moderate calcification.There is a 90% stenosis in the first OM artery. There is a 50% stenosis in the left circumflex leading to the second OM artery.  4. The  right coronary artery is a very large-size dominant vessel that generates an extensive posterior lateral ventricular system as well as the PDA. The right coronary is occluded immediately following the ostium. The entire vessel can be seen filling retrogradely almost to the ostium via left to right collaterals, especially from the circumflex system  5. The left ventricle is mildly dilated. The left ventricle systolic function is severely decreased with an estimated ejection fraction of 20 %. Regional wall motion abnormalities are not clearly seen. There is global hypokinesis. Specifically, the inferior wall appears no worse than the anterior wall. No left ventricular thrombus is seen. There is mild mitral insufficiency. The ascending aorta appears normal. There is no aortic valve stenosis by pullback. The left ventricular end-diastolic pressure is elevated.   IMPRESSIONS:  Severe multivessel CAD with total occlusion of the dominant RCA and severe stenoses in the LAD artery and OM branch.  Severe ischemic cardiomyopathy.  2.Median sternotomy, extracorporeal circulation, coronary  artery bypass grafting x4 (left internal mammary artery to left anterior descending, saphenous vein graft to posterior descending, sequential saphenous vein graft to obtuse marginals 1 and 2), endoscopic vein harvest. right leg by Dr. Roxan Hockey on 10/09/2013.  History of Presenting Illness: This is an 80 year old Caucasian female diabetic nonsmoker with history of left breast cancer treated with radiation-lumpectomy 15 years ago underwent outpatient right and left heart catheterization for symptoms of class III CHF. No prior history of cardiac catheterization. No family history of CAD. She has hypertension and diabetes. No symptoms of PND ankle edema or orthopnea. She has  had sinus tachycardia.  The echocardiogram demonstrated severe LV dysfunction EF 20-25% with LV diameter 6.5 cm and diffuse hypokinesia. There was mild to  moderate MR, the aortic valve sclerotic but not stenotic.  Outpatient left and right heart catheterization performed showed severe three-vessel CAD with chronic RCA occlusion with collateralization from the LAD and circumflex. The mid LAD has a significant stenosis. OM1 and OM 2 vessels have moderate to severe disease. LVEF is 20%  LVEDP is 19. PA pressures are normal but wedge is elevated at 18-20, mixed venous saturation 70%. A cardiothoracic consultation was obtained with Dr. Prescott Gum for the consideration of coronary artery bypass grafting surgery. Potential risks, complications, and benefits of the surgery were discussed with the patient and she agreed to proceed with surgery. She underwent a CABG x 4 on 10/09/2013.   Brief Hospital Course:  The patient was extubated the morning of surgery without difficulty. She remained afebrile and hemodynamically stable. She was initially AAI paced and weaned off of Dopamine and Milrinone drips. Gordy Councilman, a line, chest tubes, and foley were removed early in the post operative course. Coreg was started. She went into atrial fibrillation with RVR. She was placed on an Amiodarone drip. She then required Digoxin. She converted to sinus rhythm. She was volume over loaded and diuresed. She was weaned off the insulin drip. Once she was tolerating a diet, home Metformin was restarted. The patient's HGA1C pre op was 6.8. She had ABL anemia. Her last H and H was 9.4 and 28.2. She did not require a post op transfusion. The patient was felt surgically stable for transfer from the ICU to [facility] for further convalescence on 10/16/2012. She continues to progress with cardiac rehab. She is still requiring 2 liters of oxygen, but will try to wean to room air.She has been tolerating a diet and has had a bowel movement. She remained in sinus rhythm and her heart rate was in the 60's. Amiodarone was decreased to 200 daily and Digoxin was stopped.Epicardial pacing wires and chest tube  sutures will be removed prior to discharge. Provided the patient remains afebrile, hemodynamically stable, and pending morning round evaluation, she will be surgically stable for discharge on 10/21/2013  Latest Vital Signs: Blood pressure 136/65, pulse 68, temperature 97.8 F (36.6 C), temperature source Oral, resp. rate 16, height 5' 6.5" (1.689 m), weight 167 lb 1.6 oz (75.796 kg), SpO2 95.00%.  Physical Exam: Cardiovascular: RRR  Pulmonary: Slightly diminished at bases; no rales, wheezes, or rhonchi.  Abdomen: Soft, non tender, bowel sounds present.  Extremities: Mild bilateral lower extremity edema. Ecchymosis right thigh  Wounds: Clean and dry. No erythema or signs of infection.  Discharge Condition:Stable  Recent laboratory studies:  Lab Results  Component Value Date   WBC 10.2 10/16/2013   HGB 9.4* 10/16/2013   HCT 28.2* 10/16/2013   MCV 97.2 10/16/2013   PLT 307 10/16/2013   Lab Results  Component Value Date   NA 129* 10/18/2013   K 4.8 10/18/2013   CL 89* 10/18/2013   CO2 27 10/18/2013   CREATININE 0.85 10/18/2013   GLUCOSE 118* 10/18/2013     Diagnostic Studies: Dg Chest 2 View  10/16/2013   CLINICAL DATA:  Status post CABG.  Pleural effusions.  EXAM: CHEST  2 VIEW  COMPARISON:  10/15/2013  FINDINGS: Sequelae of CABG are again identified. Cardiomediastinal silhouette is unchanged with thoracic aortic calcification noted. Small bilateral pleural effusions do not appear significantly changed. The lungs are well inflated without  evidence of confluent airspace opacity, pulmonary edema, or pneumothorax. Surgical clips are noted in the left axilla.  IMPRESSION: Unchanged, small bilateral pleural effusions.   Electronically Signed   By: Logan Bores   On: 10/16/2013 07:41   Ct Chest Wo Contrast  10/07/2013   CLINICAL DATA:  Potential candidate for CABG. Aorta appeared to be heavily calcified on catheterization.  EXAM: CT CHEST WITHOUT CONTRAST  TECHNIQUE: Multidetector CT imaging of the chest was  performed following the standard protocol without IV contrast.  COMPARISON:  None.  FINDINGS: No pathologically enlarged mediastinal or axillary lymph nodes. Hilar regions are difficult to definitively evaluate without IV contrast. Aorta is moderately calcified. Extensive 3 vessel coronary artery calcification. Heart is mildly enlarged. No pericardial effusion. Small hiatal hernia. Favor a dilated left inferior pulmonary vein (series 2, image 32), rather than an enlarged lymph node.  Tiny right pleural effusion. Mild biapical pleural parenchymal scarring with subpleural extension inferiorly. Triangular-shaped nodule in the right lower lobe, adjacent to the right major fissure measures 5 mm (4 x 5 mm). Airway is unremarkable.  Incidental imaging of the upper abdomen shows the visualized portions of the liver, adrenal glands, kidneys, spleen and stomach to be otherwise grossly unremarkable. No worrisome lytic or sclerotic lesions. Degenerative changes are seen in the spine.  IMPRESSION: 1. Atherosclerotic calcification of the arterial vasculature, including extensive three-vessel involvement of the coronary arteries. 2. Tiny right pleural effusion. 3. 5 mm subpleural nodule along the right major fissure may represent a subpleural lymph node. If the patient is at high risk for bronchogenic carcinoma, follow-up chest CT at 6-12 months is recommended. If the patient is at low risk for bronchogenic carcinoma, follow-up chest CT at 12 months is recommended. This recommendation follows the consensus statement: Guidelines for Management of Small Pulmonary Nodules Detected on CT Scans: A Statement from the [employer] as published in Radiology 2005;237:395-400.   Electronically Signed   By: Lorin Picket M.D.   On: 10/07/2013 09:36    Discharge Medications:   Medication List    STOP taking these medications       aspirin 81 MG tablet  Replaced by:  aspirin 325 MG EC tablet      TAKE these medications        acetaminophen 325 MG tablet  Commonly known as:  TYLENOL  Take 650 mg by mouth every 6 (six) hours as needed.     amiodarone 200 MG tablet  Commonly known as:  PACERONE  Take 1 tablet (200 mg total) by mouth daily.     aspirin 325 MG EC tablet  Take 1 tablet (325 mg total) by mouth daily.     atorvastatin 80 MG tablet  Commonly known as:  LIPITOR  Take 1 tablet by mouth daily.     carvedilol 6.25 MG tablet  Commonly known as:  COREG  Take 1 tablet (6.25 mg total) by mouth 2 (two) times daily with a meal.     cholecalciferol 1000 UNITS tablet  Commonly known as:  VITAMIN D  Take 1,000 Units by mouth daily.     feeding supplement (GLUCERNA SHAKE) Liqd  Take 237 mLs by mouth 2 (two) times daily between meals.     ferrous sulfate 325 (65 FE) MG tablet  Take 1 tablet (325 mg total) by mouth daily with breakfast.     furosemide 40 MG tablet  Commonly known as:  LASIX  Take 1 tablet (40 mg total) by mouth daily.  levothyroxine 112 MCG tablet  Commonly known as:  SYNTHROID, LEVOTHROID  Take 1 tablet by mouth daily.     lisinopril-hydrochlorothiazide 20-12.5 MG per tablet  Commonly known as:  PRINZIDE,ZESTORETIC  Take 2 tablets by mouth daily.     magnesium oxide 400 (241.3 MG) MG tablet  Commonly known as:  MAG-OX  Take 1 tablet (400 mg total) by mouth 2 (two) times daily.     metFORMIN 1000 MG tablet  Commonly known as:  GLUCOPHAGE  Take 1 tablet by mouth 2 (two) times daily.     oxybutynin 15 MG 24 hr tablet  Commonly known as:  DITROPAN XL  Take 2 tablets by mouth daily.     pantoprazole 40 MG tablet  Commonly known as:  PROTONIX  Take 1 tablet by mouth daily.     traMADol 50 MG tablet  Commonly known as:  ULTRAM  Take 1-2 tablets (50-100 mg total) by mouth every 6 (six) hours as needed (pain).     VOLTAREN 1 % Gel  Generic drug:  diclofenac sodium  Apply 1 application topically daily.        1.Beta Blocker:  Yes [  x ]                               No   [   ]                              If No, reason:  2.Ace Inhibitor/ARB: Yes [ x  ]                                     No  [    ]                                     If No, reason:  3.Statin:   Yes [ x  ]                  No  [   ]                  If No, reason:  4.Ecasa:  Yes  [ x  ]                  No   [   ]                  If No, reason:  Note magnesium oxide for 7 days then should check level  Follow Up Appointments: Follow-up Information   Follow up with Sanda Klein, MD. (Call for an appointment for 2 weeks)    Specialty:  Cardiology   Contact information:   [address] [place] [address] [phone]       Follow up with Melrose Nakayama, MD. (PA/LAT CXR to be taken (at [facility] which is in the same building as Dr. Leonarda Salon office) one hour prior to office appointment;[facility] will mail appointment date and time)    Specialty:  Cardiothoracic Surgery   Contact information:   [place] [address] [phone]     1. Please obtain vital signs at least one time daily 2.Please  weigh the patient daily. If he or she continues to gain weight or develops lower extremity edema, contact the office at [phone]. 3. Ambulate patient at least three times daily and please use sternal precautions.  Signed: ZIMMERMAN,DONIELLE MPA-C 10/18/2013, 9:46 AM

## 2013-10-17 NOTE — Progress Notes (Signed)
CARDIAC REHAB PHASE I   PRE:  Rate/Rhythm: 77  BP:  Sitting: 129/62     SaO2: 97% 2.5 liters  MODE:  Ambulation: Patient could only walk to the hallway outside of her room and back to her bed  POST:  Rate/Rhythm: 59  BP:  Sitting: 145/59     SaO2: 96% 3 liters  2:15pm-3:05pm  Patient and I tried to get her out of bed three times.  Each time she would try and stand she felt dizzy.  On the third time the patient was able to stand without feeling dizzy.  Patient could only walk to the hallway right outside of her room because she felt weak and short of breath.  Patient states she just wasn't feeling strong today and was feeling better yesterday.  Once I got the patient back into her bed she felt better and stated that she was not in any pain.    Vonita Moss [place], [place] 10/17/2013 3:03 PM

## 2013-10-17 NOTE — Discharge Instructions (Signed)
Activity: 1.May walk up steps °               2.No lifting more than ten pounds for four weeks.  °               3.No driving for four weeks. °               4.Stop any activity that causes chest pain, shortness of breath, dizziness, sweating or excessive weakness. °               5.Avoid straining. °               6.Continue with your breathing exercises daily. ° °Diet: Diabetic diet and Low fat, Low salt diet ° °Wound Care: May shower.  Clean wounds with mild soap and water daily. Contact the office at [phone] if any problems arise. ° °Coronary Artery Bypass Grafting, Care After °Refer to this sheet in the next few weeks. These instructions provide you with information on caring for yourself after your procedure. Your health care provider may also give you more specific instructions. Your treatment has been planned according to current medical practices, but problems sometimes occur. Call your health care provider if you have any problems or questions after your procedure. °WHAT TO EXPECT AFTER THE PROCEDURE °Recovery from surgery will be different for everyone. Some people feel well after 3 or 4 weeks, while for others it takes longer. After your procedure, it is typical to have the following: °· Nausea and a lack of appetite.   °· Constipation. °· Weakness and fatigue.   °· Depression or irritability.   °· Pain or discomfort at your incision site. °HOME CARE INSTRUCTIONS °· Take medicines only as directed by your health care provider. Do not stop taking medicines or start any new medicines without first checking with your health care provider. °· Take your pulse as directed by your health care provider. °· Perform deep breathing as directed by your health care provider. If you were given a device called an incentive spirometer, use it to practice deep breathing several times a day. Support your chest with a pillow or your arms when you take deep breaths or cough. °· Keep incision areas clean, dry, and  protected. Remove or change any bandages (dressings) only as directed by your health care provider. You may have skin adhesive strips over the incision areas. Do not take the strips off. They will fall off on their own. °· Check incision areas daily for any swelling, redness, or drainage. °· If incisions were made in your legs, do the following: °¨ Avoid crossing your legs.   °¨ Avoid sitting for long periods of time. Change positions every 30 minutes.   °¨ Elevate your legs when you are sitting. °· Wear compression stockings as directed by your health care provider. These stockings help keep blood clots from forming in your legs. °· Take showers once your health care provider approves. Until then, only take sponge baths. Pat incisions dry. Do not rub incisions with a washcloth or towel. Do not take baths, swim, or use a hot tub until your health care provider approves. °· Eat foods that are high in fiber, such as raw fruits and vegetables, whole grains, beans, and nuts. Meats should be lean cut. Avoid canned, processed, and fried foods. °· Drink enough fluid to keep your urine clear or pale yellow. °· Weigh yourself every day. This helps identify if you are retaining fluid that may make your heart and lungs   work harder. °· Rest and limit activity as directed by your health care provider. You may be instructed to: °¨ Stop any activity at once if you have chest pain, shortness of breath, irregular heartbeats, or dizziness. Get help right away if you have any of these symptoms. °¨ Move around frequently for short periods or take short walks as directed by your health care provider. Increase your activities gradually. You may need physical therapy or cardiac rehabilitation to help strengthen your muscles and build your endurance. °¨ Avoid lifting, pushing, or pulling anything heavier than 10 lb (4.5 kg) for at least 6 weeks after surgery. °· Do not drive until your health care provider approves.  °· Ask your health  care provider when you may return to work. °· Ask your health care provider when you may resume sexual activity. °· Keep all follow-up visits as directed by your health care provider. This is important. °SEEK MEDICAL CARE IF: °· You have swelling, redness, increasing pain, or drainage at the site of an incision. °· You have a fever. °· You have swelling in your ankles or legs. °· You have pain in your legs.   °· You gain 2 or more pounds (0.9 kg) a day. °· You are nauseous or vomit. °· You have diarrhea.  °SEEK IMMEDIATE MEDICAL CARE IF: °· You have chest pain that goes to your jaw or arms. °· You have shortness of breath.   °· You have a fast or irregular heartbeat.   °· You notice a "clicking" in your breastbone (sternum) when you move.   °· You have numbness or weakness in your arms or legs. °· You feel dizzy or light-headed.   °MAKE SURE YOU: °· Understand these instructions. °· Will watch your condition. °· Will get help right away if you are not doing well or get worse. °Document Released: 09/15/2004 Document Revised: 07/13/2013 Document Reviewed: 08/05/2012 °[employer]® Patient Information ©2015 [employer]. This information is not intended to replace advice given to you by your health care provider. Make sure you discuss any questions you have with your health care provider. ° ° ° °

## 2013-10-17 NOTE — Progress Notes (Addendum)
      [address]       [place] [address]             [phone]        8 Days Post-Op Procedure(s) (LRB): CORONARY ARTERY BYPASS GRAFTING (CABG) x 4 using left internal mammary artery and right saphenous leg vein using endoscope. ([name]) INTRAOPERATIVE TRANSESOPHAGEAL ECHOCARDIOGRAM ([name])  Subjective: Patient with diarrhea yesterday.  Objective: Vital signs in last 24 hours: Temp:  [97.5 F (36.4 C)-98.6 F (37 C)] 98.6 F (37 C) (08/08 0438) Pulse Rate:  [55-132] 74 (08/08 0438) Cardiac Rhythm:  [-] Normal sinus rhythm (08/08 0438) Resp:  [13-19] 18 (08/08 0438) BP: (107-139)/(43-79) 123/62 mmHg (08/08 0438) SpO2:  [94 %-100 %] 98 % (08/08 0438) Weight:  [167 lb 15.9 oz (76.2 kg)] 167 lb 15.9 oz (76.2 kg) (08/08 0438)  Pre op weight 68 kg Current Weight  10/17/13 167 lb 15.9 oz (76.2 kg)      Intake/Output from previous day: 08/07 0701 - 08/08 0700 In: 30 [P.O.:30] Out: 1850 [Urine:1850]   Physical Exam:  Cardiovascular: RRR Pulmonary: Slightly diminished at bases; no rales, wheezes, or rhonchi. Abdomen: Soft, non tender, bowel sounds present. Extremities: Mild bilateral lower extremity edema. Ecchymosis right thigh Wounds: Clean and dry.  No erythema or signs of infection.  Lab Results: CBC: Recent Labs  10/15/13 0303 10/16/13 0312  WBC 9.9 10.2  HGB 8.8* 9.4*  HCT 27.1* 28.2*  PLT 268 307   BMET:  Recent Labs  10/15/13 0303 10/16/13 0312  NA 133* 132*  K 5.3 5.1  CL 97 95*  CO2 26 26  GLUCOSE 138* 123*  BUN 25* 21  CREATININE 0.78 0.69  CALCIUM 8.6 8.8    PT/INR:  Lab Results  Component Value Date   INR 1.35 10/09/2013   INR 1.03 10/07/2013   INR 0.98 10/05/2013   ABG:  INR: Will add last result for INR, ABG once components are confirmed Will add last 4 CBG results once components are confirmed  Assessment/Plan:  1. CV - Previous a fib with RVR. History of ischemic cardiomyopathy (LVEF 20%).On back up VVI at  43. SR in the 70's this am. On Amiodarone 200 bid, Coreg 3.125 bid, Digoxin 0.125 daily, Lisinopril 20 daily, and HCTZ 12.5 daily. Will decrease Amiodarone to 200 daily. 2.  Pulmonary - On 2 liters of oxygen via [place]. Wean to room as tolerates. Encourage incentive spirometer 3. Volume Overload -On Lasix 40 IV daily 4.  Acute blood loss anemia - H and H yesterday stable at 9.4 and 28.2 5. DM-CBGs 130/126/124. On Metformin 1000 bid. Pre op HGA1C 6.8. 6. Because of frequent urination complaints, UA checked yesterday. Negative for UTI. Likely related to diuresis 7. Hope to discontinue back up VVI and remove EPW in am 8. Stop stool softeners 9. Possible discharge on Monday or Tuesday. GOing to [facility]  ZIMMERMAN,DONIELLE MPA-C 10/17/2013,7:40 AM  I have seen and examined Marissa Jefferson and agree with the above assessment  and plan.  Grace Isaac MD [facility] [phone] Office [phone] 10/17/2013 11:57 AM

## 2013-10-18 LAB — BASIC METABOLIC PANEL
Anion gap: 13 (ref 5–15)
BUN: 26 mg/dL — AB (ref 6–23)
CALCIUM: 9 mg/dL (ref 8.4–10.5)
CO2: 27 meq/L (ref 19–32)
Chloride: 89 mEq/L — ABNORMAL LOW (ref 96–112)
Creatinine, Ser: 0.85 mg/dL (ref 0.50–1.10)
GFR calc Af Amer: 70 mL/min — ABNORMAL LOW (ref >90)
GFR, EST NON AFRICAN AMERICAN: 60 mL/min — AB (ref >90)
GLUCOSE: 118 mg/dL — AB (ref 70–99)
Potassium: 4.8 mEq/L (ref 3.7–5.3)
Sodium: 129 mEq/L — ABNORMAL LOW (ref 137–147)

## 2013-10-18 LAB — GLUCOSE, CAPILLARY
GLUCOSE-CAPILLARY: 143 mg/dL — AB (ref 70–99)
GLUCOSE-CAPILLARY: 115 mg/dL — AB (ref 70–99)
Glucose-Capillary: 125 mg/dL — ABNORMAL HIGH (ref 70–99)
Glucose-Capillary: 133 mg/dL — ABNORMAL HIGH (ref 70–99)

## 2013-10-18 MED ORDER — FUROSEMIDE 40 MG PO TABS
40.0000 mg | ORAL_TABLET | Freq: Every day | ORAL | Status: DC
  Administered 2013-10-18: 40 mg via ORAL
  Filled 2013-10-18 (×3): qty 1

## 2013-10-18 MED ORDER — FUROSEMIDE 40 MG PO TABS
40.0000 mg | ORAL_TABLET | Freq: Every day | ORAL | Status: DC
Start: 2013-10-19 — End: 2013-10-18

## 2013-10-18 MED ORDER — OXYBUTYNIN CHLORIDE ER 15 MG PO TB24
30.0000 mg | ORAL_TABLET | Freq: Every day | ORAL | Status: DC
  Administered 2013-10-18 – 2013-10-20 (×3): 30 mg via ORAL
  Filled 2013-10-18 (×4): qty 2

## 2013-10-18 MED ORDER — LISINOPRIL 20 MG PO TABS
20.0000 mg | ORAL_TABLET | Freq: Every day | ORAL | Status: DC
  Administered 2013-10-18 – 2013-10-21 (×4): 20 mg via ORAL
  Filled 2013-10-18 (×4): qty 1

## 2013-10-18 MED ORDER — HYDROCHLOROTHIAZIDE 12.5 MG PO CAPS
12.5000 mg | ORAL_CAPSULE | Freq: Every day | ORAL | Status: DC
  Administered 2013-10-19 – 2013-10-21 (×3): 12.5 mg via ORAL
  Filled 2013-10-18 (×3): qty 1

## 2013-10-18 MED ORDER — FERROUS SULFATE 325 (65 FE) MG PO TABS
325.0000 mg | ORAL_TABLET | Freq: Every day | ORAL | Status: DC
Start: 2013-10-19 — End: 2013-10-21
  Administered 2013-10-19 – 2013-10-21 (×3): 325 mg via ORAL
  Filled 2013-10-18 (×4): qty 1

## 2013-10-18 NOTE — Progress Notes (Addendum)
      [address]       [place] [address]             [phone]        9 Days Post-Op Procedure(s) (LRB): CORONARY ARTERY BYPASS GRAFTING (CABG) x 4 using left internal mammary artery and right saphenous leg vein using endoscope. ([name]) INTRAOPERATIVE TRANSESOPHAGEAL ECHOCARDIOGRAM ([name])  Subjective: No further diarrhea since stool softeners stopped. Had some dizziness when trying to get out of bed and walk with rehab yesterday.  Objective: Vital signs in last 24 hours: Temp:  [97.8 F (36.6 C)-98.4 F (36.9 C)] 97.8 F (36.6 C) (08/09 0444) Pulse Rate:  [63-71] 68 (08/09 0444) Cardiac Rhythm:  [-] Normal sinus rhythm (08/09 0444) Resp:  [16-18] 16 (08/09 0444) BP: (118-136)/(65-71) 136/65 mmHg (08/09 0444) SpO2:  [94 %-98 %] 95 % (08/09 0444) Weight:  [167 lb 1.6 oz (75.796 kg)] 167 lb 1.6 oz (75.796 kg) (08/09 0444)  Pre op weight 68 kg Current Weight  10/18/13 167 lb 1.6 oz (75.796 kg)      Intake/Output from previous day: 08/08 0701 - 08/09 0700 In: 600 [P.O.:600] Out: 2300 [Urine:2300]   Physical Exam:  Cardiovascular: RRR Pulmonary: Slightly diminished at bases; no rales, wheezes, or rhonchi. Abdomen: Soft, non tender, bowel sounds present. Extremities: Mild bilateral lower extremity edema. Ecchymosis right thigh Wounds: Clean and dry.  No erythema or signs of infection.  Lab Results: CBC:  Recent Labs  10/16/13 0312  WBC 10.2  HGB 9.4*  HCT 28.2*  PLT 307   BMET:   Recent Labs  10/16/13 0312 10/18/13 0400  NA 132* 129*  K 5.1 4.8  CL 95* 89*  CO2 26 27  GLUCOSE 123* 118*  BUN 21 26*  CREATININE 0.69 0.85  CALCIUM 8.8 9.0    PT/INR:  Lab Results  Component Value Date   INR 1.35 10/09/2013   INR 1.03 10/07/2013   INR 0.98 10/05/2013   ABG:  INR: Will add last result for INR, ABG once components are confirmed Will add last 4 CBG results once components are confirmed  Assessment/Plan:  1. CV - Previous a fib  with RVR. History of ischemic cardiomyopathy (LVEF 20%).On Amiodarone 200 daily, Coreg 3.125 bid, Digoxin 0.125 daily, Lisinopril 20 daily, and HCTZ 12.5 daily. Will stop Digoxin as HR remains SR in the 60's. 2.  Pulmonary - On 2 liters of oxygen via [place]. Wean to room as tolerates. Encourage incentive spirometer 3. Volume Overload -On Lasix 40 IV daily. Will change to orally 4.  Acute blood loss anemia - Last H and H 9.4 and 28.2. Start Ferrous sulfate. 5. DM-CBGs 119/125/125. On Metformin 1000 bid. Pre op HGA1C 6.8. 6. Hyponatremia-sodium 129. Likely related to diuresis. Will recheck in am 7. Remove EPW in am 8. Deconditioned-continue with CRPI 9. Possible discharge to SNF early this week. Going to [facility].  ZIMMERMAN,DONIELLE MPA-C 10/18/2013,8:06 AM   Slowly improving, walking in hall better I have seen and examined Marissa Jefferson and agree with the above assessment  and plan.  Grace Isaac MD Beeper [phone] Office [phone] 10/18/2013 11:35 AM

## 2013-10-19 DIAGNOSIS — Z951 Presence of aortocoronary bypass graft: Secondary | ICD-10-CM

## 2013-10-19 DIAGNOSIS — I5043 Acute on chronic combined systolic (congestive) and diastolic (congestive) heart failure: Principal | ICD-10-CM

## 2013-10-19 LAB — GLUCOSE, CAPILLARY
GLUCOSE-CAPILLARY: 129 mg/dL — AB (ref 70–99)
Glucose-Capillary: 114 mg/dL — ABNORMAL HIGH (ref 70–99)
Glucose-Capillary: 138 mg/dL — ABNORMAL HIGH (ref 70–99)
Glucose-Capillary: 121 mg/dL — ABNORMAL HIGH (ref 70–99)

## 2013-10-19 LAB — BASIC METABOLIC PANEL
ANION GAP: 12 (ref 5–15)
BUN: 17 mg/dL (ref 6–23)
CHLORIDE: 93 meq/L — AB (ref 96–112)
CO2: 30 mEq/L (ref 19–32)
Calcium: 8.8 mg/dL (ref 8.4–10.5)
Creatinine, Ser: 0.65 mg/dL (ref 0.50–1.10)
GFR calc non Af Amer: 78 mL/min — ABNORMAL LOW (ref >90)
Glucose, Bld: 110 mg/dL — ABNORMAL HIGH (ref 70–99)
POTASSIUM: 4.2 meq/L (ref 3.7–5.3)
SODIUM: 135 meq/L — AB (ref 137–147)

## 2013-10-19 LAB — MAGNESIUM: Magnesium: 1 mg/dL — ABNORMAL LOW (ref 1.5–2.5)

## 2013-10-19 MED ORDER — MAGNESIUM OXIDE 400 (241.3 MG) MG PO TABS
400.0000 mg | ORAL_TABLET | Freq: Two times a day (BID) | ORAL | Status: DC
  Administered 2013-10-19 – 2013-10-21 (×5): 400 mg via ORAL
  Filled 2013-10-19 (×6): qty 1

## 2013-10-19 NOTE — Progress Notes (Signed)
OR paged and [place] PA made aware of Mag of 1.0, awaiting call back for orders Rickard Rhymes, RN

## 2013-10-19 NOTE — Progress Notes (Signed)
PT Cancellation Note  Patient Details Name: Marissa Jefferson MRN: [id number] DOB: 09/09/1924   Cancelled Treatment:    Reason Eval/Treat Not Completed: Patient declined despite max encouragement.   Kaylene Dawn 10/19/2013, 2:37 PM  [facility] PT [phone]

## 2013-10-19 NOTE — Progress Notes (Signed)
[place] confirmed to pull pacing wires per protocol Rickard Rhymes, RN

## 2013-10-19 NOTE — Progress Notes (Signed)
Pt refused to go for walk, will continue to encourage Rickard Rhymes, RN

## 2013-10-19 NOTE — Progress Notes (Signed)
Pacing wires removed per order and protocol, pt tolerated well, wires intact upon removal, pt made aware of bedrest for 1 hour and stated understanding, vitals taken, will continue to monitor closely Rickard Rhymes, RN

## 2013-10-19 NOTE — Progress Notes (Addendum)
[address]       [place] [address]             [phone]      10 Days Post-Op Procedure(s) (LRB): CORONARY ARTERY BYPASS GRAFTING (CABG) x 4 using left internal mammary artery and right saphenous leg vein using endoscope. ([name]) INTRAOPERATIVE TRANSESOPHAGEAL ECHOCARDIOGRAM ([name]) Subjective: Feels ok, fatigues easily , frequent PVC's   Objective: Vital signs in last 24 hours: Temp:  [98 F (36.7 C)-98.9 F (37.2 C)] 98.9 F (37.2 C) (08/10 0412) Pulse Rate:  [68-84] 84 (08/10 0412) Cardiac Rhythm:  [-] Normal sinus rhythm (08/09 1945) Resp:  [18-20] 20 (08/10 0412) BP: (102-146)/(63-79) 146/79 mmHg (08/10 0412) SpO2:  [95 %-96 %] 95 % (08/10 0412) Weight:  [160 lb 8 oz (72.802 kg)] 160 lb 8 oz (72.802 kg) (08/10 0412)  Hemodynamic parameters for last 24 hours:    Intake/Output from previous day: 08/09 0701 - 08/10 0700 In: 480 [P.O.:480] Out: 250 [Urine:250] Intake/Output this shift:    General appearance: alert, cooperative and no distress Heart: regular rate and rhythm and occas extrasystole Lungs: mildly dim in the bases Abdomen: benign Extremities: no edema Wound: incis healing well  Lab Results: No results found for this basename: WBC, HGB, HCT, PLT,  in the last 72 hours BMET:  Recent Labs  10/18/13 0400 10/19/13 0517  NA 129* 135*  K 4.8 4.2  CL 89* 93*  CO2 27 30  GLUCOSE 118* 110*  BUN 26* 17  CREATININE 0.85 0.65  CALCIUM 9.0 8.8    PT/INR: No results found for this basename: LABPROT, INR,  in the last 72 hours ABG    Component Value Date/Time   PHART 7.442 10/10/2013 0904   HCO3 24.0 10/10/2013 0904   TCO2 22 10/10/2013 1642   ACIDBASEDEF 1.0 10/09/2013 2217   O2SAT 48.7 10/14/2013 1250   CBG (last 3)   Recent Labs  10/18/13 1606 10/18/13 2112 10/19/13 0611  GLUCAP 115* 133* 114*   Scheduled Meds: . amiodarone  200 mg Oral Daily  . aspirin EC  325 mg Oral Daily   Or  . aspirin  324 mg Per Tube Daily  .  atorvastatin  80 mg Oral Daily  . carvedilol  3.125 mg Oral BID WC  . feeding supplement (GLUCERNA SHAKE)  237 mL Oral BID BM  . ferrous sulfate  325 mg Oral Q breakfast  . furosemide  40 mg Oral Daily  . hydrochlorothiazide  12.5 mg Oral Daily   And  . lisinopril  20 mg Oral Daily  . insulin aspart  0-15 Units Subcutaneous TID WC  . levothyroxine  112 mcg Oral QAC breakfast  . metFORMIN  1,000 mg Oral BID WC  . oxybutynin  30 mg Oral QHS  . pantoprazole  40 mg Oral Daily  . sodium chloride  3 mL Intravenous Q12H   Continuous Infusions:  PRN Meds:.sodium chloride, alum & mag hydroxide-simeth, guaiFENesin-dextromethorphan, magnesium hydroxide, ondansetron (ZOFRAN) IV, sodium chloride, traMADol  No results found.  Assessment/Plan: S/P Procedure(s) (LRB): CORONARY ARTERY BYPASS GRAFTING (CABG) x 4 using left internal mammary artery and right saphenous leg vein using endoscope. ([name]) INTRAOPERATIVE TRANSESOPHAGEAL ECHOCARDIOGRAM ([name])  1 steady improvement, cont to push rehab as able 2 wean O2  3 hemodyn stable with mild hypertension at times, conts current diuretics, cardiac meds, QTc in 480's 4 freq pvc's, K+ ok, will check MG++ 5 sugars controlled. Sodium improved   LOS: 13 days  Jefferson,Marissa E 10/19/2013  Overall looks good. She is making progress albeit slowly  Continue lasix for CHF- chronic decompensated systolic LV failure  Plan dc to [facility] on Wednesday

## 2013-10-19 NOTE — Progress Notes (Signed)
CARDIAC REHAB PHASE I   PRE:  Rate/Rhythm: 78 SR with occ PVC    BP: sitting 122/78    SaO2: 97 1L, 90 RA  MODE:  Ambulation: 80 ft   POST:  Rate/Rhythm: 92 irregular with occ PVC    BP: sitting 112/58     SaO2: 92 2L  Pt overwhelmed with feelings of weakness. Small steps with RW, assist x2, gait belt. Wanted to turn around at door of room. Had to encourage pt that she was doing well and could push farther. Applied 2L O2 since SaO2 was 87 RA. Sat x1 for rest, completed total of 80 ft. Apparently pt walked 150 ft with PT last Friday. To bed to pull EPW, left on 1L. Encouraged pt to try to walk farther each walk. [phone]   Darrick Meigs CES, ACSM 10/19/2013 9:09 AM

## 2013-10-19 NOTE — Clinical Documentation Improvement (Signed)
  Per Notes patient "Volume Overload -On Lasix 40 IV daily. Will change to orally", on Coreg, with history A/C Combined CHF class 3. Please clarify if patient is being treated for decompensated CHF. Thank you.  Thank You, Ezekiel Ina ,RN Clinical Documentation Specialist:  [phone]  [place] Information Management

## 2013-10-20 LAB — GLUCOSE, CAPILLARY
GLUCOSE-CAPILLARY: 131 mg/dL — AB (ref 70–99)
GLUCOSE-CAPILLARY: 114 mg/dL — AB (ref 70–99)
Glucose-Capillary: 133 mg/dL — ABNORMAL HIGH (ref 70–99)
Glucose-Capillary: 128 mg/dL — ABNORMAL HIGH (ref 70–99)

## 2013-10-20 MED ORDER — ACETAMINOPHEN 325 MG PO TABS
650.0000 mg | ORAL_TABLET | Freq: Four times a day (QID) | ORAL | Status: DC | PRN
  Administered 2013-10-21: 650 mg via ORAL
  Filled 2013-10-20: qty 2

## 2013-10-20 NOTE — Progress Notes (Signed)
Physical Therapy Treatment Patient Details Name: Marissa Jefferson MRN: [id number] DOB: 10/07/1924 Today's Date: 10/20/2013    History of Present Illness Pt s/p CABG x 4 on 7/31. Pt with recent dx of CHF. PMH - HTN, DM.    PT Comments    Pt making slow progress with therapy due to participation levels.  She ambulated earlier with cardiac rehab and therefore refused to get OOB with therapy this afternoon.  She was agreeable to bed level exercises.  Encouraged her to get OOB tomorrow w/ therapy before D/C to [facility].    Follow Up Recommendations  SNF     Equipment Recommendations  Other (comment) (rollator)    Recommendations for Other Services       Precautions / Restrictions Precautions Precautions: Sternal;Fall Restrictions Weight Bearing Restrictions:  (Sternal precautions)    Mobility  Bed Mobility Overal bed mobility:  (pt declined OOB)                Transfers                 General transfer comment: Pt declined OOB  Ambulation/Gait             General Gait Details: Pt declined OOB   Stairs            Wheelchair Mobility    Modified Rankin (Stroke Patients Only)       Balance                                    Cognition Arousal/Alertness: Awake/alert Behavior During Therapy: WFL for tasks assessed/performed Overall Cognitive Status: Within Functional Limits for tasks assessed                      Exercises General Exercises - Lower Extremity Ankle Circles/Pumps: Strengthening;Both;15 reps Quad Sets: Strengthening;Both;10 reps Hip ABduction/ADduction: Strengthening;Both;10 reps (then x 10 reps w/ pillow squeeze) Straight Leg Raises: Strengthening;Both;10 reps    General Comments        Pertinent Vitals/Pain Pain Assessment: No/denies pain    Home Living                      Prior Function            PT Goals (current goals can now be found in the care plan section) Acute Rehab  PT Goals PT Goal Formulation: With patient Time For Goal Achievement: 10/27/13 Potential to Achieve Goals: Good Progress towards PT goals: Progressing toward goals    Frequency  Min 3X/week    PT Plan Current plan remains appropriate    Co-evaluation             End of Session   Activity Tolerance: Patient limited by fatigue Patient left: in bed;with call bell/phone within reach     Time: 1355-1415 PT Time Calculation (min): 20 min  Charges:  $Therapeutic Exercise: 8-22 mins                    G Codes:      Denice Bors 10/20/2013, 2:17 PM

## 2013-10-20 NOTE — Progress Notes (Addendum)
[address]       [place] [address]             [phone]      11 Days Post-Op Procedure(s) (LRB): CORONARY ARTERY BYPASS GRAFTING (CABG) x 4 using left internal mammary artery and right saphenous leg vein using endoscope. ([name]) INTRAOPERATIVE TRANSESOPHAGEAL ECHOCARDIOGRAM ([name]) Subjective: conts to get stronger   Objective: Vital signs in last 24 hours: Temp:  [97.7 F (36.5 C)-98 F (36.7 C)] 98 F (36.7 C) (08/11 0441) Pulse Rate:  [70-94] 85 (08/11 0441) Cardiac Rhythm:  [-] Normal sinus rhythm (08/10 1945) Resp:  [18-20] 18 (08/11 0441) BP: (94-141)/(42-78) 141/68 mmHg (08/11 0441) SpO2:  [94 %-98 %] 95 % (08/11 0441) Weight:  [160 lb 0.9 oz (72.6 kg)] 160 lb 0.9 oz (72.6 kg) (08/11 0441)  Hemodynamic parameters for last 24 hours:    Intake/Output from previous day: 08/10 0701 - 08/11 0700 In: 480 [P.O.:480] Out: 200 [Urine:200] Intake/Output this shift:    General appearance: alert, cooperative and no distress Heart: regular rate and rhythm and frequent extrasystoles Lungs: dim left base Abdomen: benign Extremities: no sig edema Wound: incis healing well  Lab Results: No results found for this basename: WBC, HGB, HCT, PLT,  in the last 72 hours BMET:  Recent Labs  10/18/13 0400 10/19/13 0517  NA 129* 135*  K 4.8 4.2  CL 89* 93*  CO2 27 30  GLUCOSE 118* 110*  BUN 26* 17  CREATININE 0.85 0.65  CALCIUM 9.0 8.8    PT/INR: No results found for this basename: LABPROT, INR,  in the last 72 hours ABG    Component Value Date/Time   PHART 7.442 10/10/2013 0904   HCO3 24.0 10/10/2013 0904   TCO2 22 10/10/2013 1642   ACIDBASEDEF 1.0 10/09/2013 2217   O2SAT 48.7 10/14/2013 1250   CBG (last 3)   Recent Labs  10/19/13 1627 10/19/13 2136 10/20/13 0620  GLUCAP 129* 121* 133*   Scheduled Meds: . amiodarone  200 mg Oral Daily  . aspirin EC  325 mg Oral Daily   Or  . aspirin  324 mg Per Tube Daily  . atorvastatin  80 mg Oral  Daily  . carvedilol  3.125 mg Oral BID WC  . feeding supplement (GLUCERNA SHAKE)  237 mL Oral BID BM  . ferrous sulfate  325 mg Oral Q breakfast  . furosemide  40 mg Oral Daily  . hydrochlorothiazide  12.5 mg Oral Daily   And  . lisinopril  20 mg Oral Daily  . insulin aspart  0-15 Units Subcutaneous TID WC  . levothyroxine  112 mcg Oral QAC breakfast  . magnesium oxide  400 mg Oral BID  . metFORMIN  1,000 mg Oral BID WC  . oxybutynin  30 mg Oral QHS  . pantoprazole  40 mg Oral Daily  . sodium chloride  3 mL Intravenous Q12H   Continuous Infusions:  PRN Meds:.sodium chloride, alum & mag hydroxide-simeth, guaiFENesin-dextromethorphan, magnesium hydroxide, ondansetron (ZOFRAN) IV, sodium chloride, traMADol  No results found.  Assessment/Plan: S/P Procedure(s) (LRB): CORONARY ARTERY BYPASS GRAFTING (CABG) x 4 using left internal mammary artery and right saphenous leg vein using endoscope. ([name]) INTRAOPERATIVE TRANSESOPHAGEAL ECHOCARDIOGRAM ([name])  1 still with frequent pvc's getting po MG++ replacement for 1.0 2 cont pulm toilet/rehab, still on 1 litre O2- hopefully weaned off today 3 Cont gentle diuretic use with chronic syst LV failure 4 sugars fine  LOS: 14 days  GOLD,WAYNE E 10/20/2013  Patient seen and examined, agree with above Plan to dc to [facility]

## 2013-10-20 NOTE — Progress Notes (Signed)
NUTRITION FOLLOW UP  INTERVENTION: Continue Glucerna Shake po BID, each supplement provides 220 kcal and 10 grams of protein RD to follow for nutrition care plan  NUTRITION DIAGNOSIS: Inadequate oral intake now related to limited appetite as evidenced by PO intake 35-50%, ongoing  New Goal: Pt to meet >/= 90% of their estimated nutrition needs, progressing  Monitor:  PO & supplemental intake, weight, labs, I/O's  ASSESSMENT: 80 yo Female with PMH of HTN, DM, HLD; s/p cardiac cath 7/28 which showed severe three-vessel CAD with chronic RCA occlusion with collateralization from the LAD and circumflex.  Patient s/p procedure 7/31: CORONARY ARTERY BYPASS GRAFTING x 4   Patient extubated 8/1.  Transferred to 2W-Cardiac 8/7.  PO intake remains variable at 50-75% per flowsheet records.  Glucerna Shakes in place twice daily between meals.  Pt is drinking.  For discharge to [facility]  Height: Ht Readings from Last 1 Encounters:  10/14/13 5' 6.5" (1.689 m)    Weight: Wt Readings from Last 1 Encounters:  10/20/13 160 lb 0.9 oz (72.6 kg)    BMI:  Body mass index is 25.45 kg/(m^2).  Re-estimated Nutritional Needs: Kcal: 1700-1900 Protein: 90-100 gm Fluid: per MD  Skin: chest surgical incision   Diet Order: Heart Healthy/Carbohydrate Modified   Intake/Output Summary (Last 24 hours) at 10/20/13 1220 Last data filed at 10/20/13 0816  Gross per 24 hour  Intake    240 ml  Output    500 ml  Net   -260 ml    Labs:   Recent Labs Lab 10/16/13 0312 10/18/13 0400 10/19/13 0517 10/19/13 0547  NA 132* 129* 135*  --   K 5.1 4.8 4.2  --   CL 95* 89* 93*  --   CO2 26 27 30   --   BUN 21 26* 17  --   CREATININE 0.69 0.85 0.65  --   CALCIUM 8.8 9.0 8.8  --   MG  --   --   --  1.0*  GLUCOSE 123* 118* 110*  --     CBG (last 3)   Recent Labs  10/19/13 2136 10/20/13 0620 10/20/13 1127  GLUCAP 121* 133* 128*    Scheduled Meds: . amiodarone  200 mg Oral  Daily  . aspirin EC  325 mg Oral Daily   Or  . aspirin  324 mg Per Tube Daily  . atorvastatin  80 mg Oral Daily  . carvedilol  3.125 mg Oral BID WC  . feeding supplement (GLUCERNA SHAKE)  237 mL Oral BID BM  . ferrous sulfate  325 mg Oral Q breakfast  . furosemide  40 mg Oral Daily  . hydrochlorothiazide  12.5 mg Oral Daily   And  . lisinopril  20 mg Oral Daily  . insulin aspart  0-15 Units Subcutaneous TID WC  . levothyroxine  112 mcg Oral QAC breakfast  . magnesium oxide  400 mg Oral BID  . metFORMIN  1,000 mg Oral BID WC  . oxybutynin  30 mg Oral QHS  . pantoprazole  40 mg Oral Daily  . sodium chloride  3 mL Intravenous Q12H    Continuous Infusions:    Past Medical History  Diagnosis Date  . DM2 (diabetes mellitus, type 2)   . Hypertension   . Hyperlipidemia   . GERD (gastroesophageal reflux disease)   . Osteopenia   . CAD (coronary artery disease), native coronary artery 10/06/2013    LAD 90%, CFX 90%, RCA 100% w/ collat  .  Ischemic dilated cardiomyopathy 09/2013    EF 20-25% by echo  . Chronic combined systolic and diastolic CHF, NYHA class 2 09/2013  . NSVT (nonsustained ventricular tachycardia) 09/2013  . OAB (overactive bladder)   . Hypothyroidism   . CVA (cerebral vascular accident) 1968    leaving no deficit  . Arthritis     "knees" (10/08/2013)  . Breast cancer     "left; I had 62 sessions of radiation"    Past Surgical History  Procedure Laterality Date  . Colonoscopy  2005  . Cardiac catheterization  10/06/2013  . Cataract extraction w/ intraocular lens  implant, bilateral Bilateral ~ 2009  . Tonsillectomy and adenoidectomy  1930's  . Breast lumpectomy Left 1998  . Breast lumpectomy with axillary lymph node dissection Left 1998  . Coronary artery bypass graft [name] 10/09/2013    Procedure: CORONARY ARTERY BYPASS GRAFTING (CABG) x 4 using left internal mammary artery and right saphenous leg vein using endoscope.;  Surgeon: Melrose Nakayama, MD;   Location: [facility];  Service: Open Heart Surgery;  Laterality: [name];  . Intraoperative transesophageal echocardiogram [name] 10/09/2013    Procedure: INTRAOPERATIVE TRANSESOPHAGEAL ECHOCARDIOGRAM;  Surgeon: Melrose Nakayama, MD;  Location: [facility];  Service: Open Heart Surgery;  Laterality: [name];    Arthur Holms, RD, LDN Pager #: [phone] After-Hours Pager #: [phone]

## 2013-10-20 NOTE — Progress Notes (Signed)
CARDIAC REHAB PHASE I   PRE:  Rate/Rhythm: 75 SR PVCs pairs and singles  BP:  Supine: 119/70  Sitting:   Standing:    SaO2: 91%RA  MODE:  Ambulation: 150 ft   POST:  Rate/Rhythm: 86 SR PVCs  BP:  Supine:   Sitting: 132/76  Standing:    SaO2: 87-88%RA hall, put on 2L 99% 1055-1132 Pt encouraged to walk 150 ft using rollator and asst x 2. Pt sat twice to rest. Desat during walk so had to put on 2L to keep sats up while walking. Back to RA when resting in recliner in room. Pt c/o feeling like she could not take a deep breath. Encouraged pt to walk with PT later. Will keep as asst x 2.   Graylon Good, RN BSN  10/20/2013 11:29 AM

## 2013-10-20 NOTE — Progress Notes (Signed)
CSW (Clinical Education officer, museum) left voicemail to notify [employer] [facility] of plan for [employer].  [place], [place]

## 2013-10-21 DIAGNOSIS — D649 Anemia, unspecified: Secondary | ICD-10-CM | POA: Diagnosis not present

## 2013-10-21 DIAGNOSIS — Z9889 Other specified postprocedural states: Secondary | ICD-10-CM | POA: Diagnosis not present

## 2013-10-21 DIAGNOSIS — K219 Gastro-esophageal reflux disease without esophagitis: Secondary | ICD-10-CM | POA: Diagnosis not present

## 2013-10-21 DIAGNOSIS — N318 Other neuromuscular dysfunction of bladder: Secondary | ICD-10-CM | POA: Diagnosis not present

## 2013-10-21 DIAGNOSIS — D62 Acute posthemorrhagic anemia: Secondary | ICD-10-CM | POA: Diagnosis not present

## 2013-10-21 DIAGNOSIS — E119 Type 2 diabetes mellitus without complications: Secondary | ICD-10-CM | POA: Diagnosis not present

## 2013-10-21 DIAGNOSIS — I2589 Other forms of chronic ischemic heart disease: Secondary | ICD-10-CM | POA: Diagnosis not present

## 2013-10-21 DIAGNOSIS — Z48812 Encounter for surgical aftercare following surgery on the circulatory system: Secondary | ICD-10-CM | POA: Diagnosis not present

## 2013-10-21 DIAGNOSIS — E1059 Type 1 diabetes mellitus with other circulatory complications: Secondary | ICD-10-CM | POA: Diagnosis not present

## 2013-10-21 DIAGNOSIS — R0609 Other forms of dyspnea: Secondary | ICD-10-CM | POA: Diagnosis not present

## 2013-10-21 DIAGNOSIS — R279 Unspecified lack of coordination: Secondary | ICD-10-CM | POA: Diagnosis not present

## 2013-10-21 DIAGNOSIS — I4729 Other ventricular tachycardia: Secondary | ICD-10-CM | POA: Diagnosis not present

## 2013-10-21 DIAGNOSIS — E039 Hypothyroidism, unspecified: Secondary | ICD-10-CM | POA: Diagnosis not present

## 2013-10-21 DIAGNOSIS — I10 Essential (primary) hypertension: Secondary | ICD-10-CM | POA: Diagnosis not present

## 2013-10-21 DIAGNOSIS — J90 Pleural effusion, not elsewhere classified: Secondary | ICD-10-CM | POA: Diagnosis not present

## 2013-10-21 DIAGNOSIS — I5020 Unspecified systolic (congestive) heart failure: Secondary | ICD-10-CM | POA: Diagnosis not present

## 2013-10-21 DIAGNOSIS — E785 Hyperlipidemia, unspecified: Secondary | ICD-10-CM | POA: Diagnosis not present

## 2013-10-21 DIAGNOSIS — I2510 Atherosclerotic heart disease of native coronary artery without angina pectoris: Secondary | ICD-10-CM | POA: Diagnosis not present

## 2013-10-21 DIAGNOSIS — I472 Ventricular tachycardia: Secondary | ICD-10-CM

## 2013-10-21 DIAGNOSIS — I209 Angina pectoris, unspecified: Secondary | ICD-10-CM | POA: Diagnosis not present

## 2013-10-21 DIAGNOSIS — Z8673 Personal history of transient ischemic attack (TIA), and cerebral infarction without residual deficits: Secondary | ICD-10-CM | POA: Diagnosis not present

## 2013-10-21 DIAGNOSIS — I428 Other cardiomyopathies: Secondary | ICD-10-CM | POA: Diagnosis not present

## 2013-10-21 DIAGNOSIS — I509 Heart failure, unspecified: Secondary | ICD-10-CM | POA: Diagnosis not present

## 2013-10-21 DIAGNOSIS — Z853 Personal history of malignant neoplasm of breast: Secondary | ICD-10-CM | POA: Diagnosis not present

## 2013-10-21 DIAGNOSIS — Z951 Presence of aortocoronary bypass graft: Secondary | ICD-10-CM | POA: Diagnosis not present

## 2013-10-21 DIAGNOSIS — I4891 Unspecified atrial fibrillation: Secondary | ICD-10-CM | POA: Diagnosis not present

## 2013-10-21 DIAGNOSIS — I5022 Chronic systolic (congestive) heart failure: Secondary | ICD-10-CM | POA: Diagnosis not present

## 2013-10-21 DIAGNOSIS — M6281 Muscle weakness (generalized): Secondary | ICD-10-CM | POA: Diagnosis not present

## 2013-10-21 DIAGNOSIS — R0602 Shortness of breath: Secondary | ICD-10-CM | POA: Diagnosis not present

## 2013-10-21 DIAGNOSIS — R269 Unspecified abnormalities of gait and mobility: Secondary | ICD-10-CM | POA: Diagnosis not present

## 2013-10-21 LAB — GLUCOSE, CAPILLARY: Glucose-Capillary: 138 mg/dL — ABNORMAL HIGH (ref 70–99)

## 2013-10-21 MED ORDER — GLUCERNA SHAKE PO LIQD: 237.0000 mL | Freq: Two times a day (BID) | ORAL | Status: DC

## 2013-10-21 MED ORDER — ASPIRIN 325 MG PO TBEC: 325.0000 mg | DELAYED_RELEASE_TABLET | Freq: Every day | ORAL | Status: DC

## 2013-10-21 MED ORDER — CARVEDILOL 3.125 MG PO TABS: 3.1250 mg | ORAL_TABLET | Freq: Two times a day (BID) | ORAL | Status: DC

## 2013-10-21 MED ORDER — FERROUS SULFATE 325 (65 FE) MG PO TABS: 325.0000 mg | ORAL_TABLET | Freq: Every day | ORAL | Status: DC

## 2013-10-21 MED ORDER — CARVEDILOL 6.25 MG PO TABS: 6.2500 mg | ORAL_TABLET | Freq: Two times a day (BID) | ORAL | Status: DC

## 2013-10-21 MED ORDER — CARVEDILOL 6.25 MG PO TABS
6.2500 mg | ORAL_TABLET | Freq: Two times a day (BID) | ORAL | Status: DC
Start: 2013-10-21 — End: 2013-10-21
  Filled 2013-10-21 (×2): qty 1

## 2013-10-21 MED ORDER — MAGNESIUM OXIDE 400 (241.3 MG) MG PO TABS: 400.0000 mg | ORAL_TABLET | Freq: Two times a day (BID) | ORAL | Status: DC

## 2013-10-21 MED ORDER — TRAMADOL HCL 50 MG PO TABS
50.0000 mg | ORAL_TABLET | Freq: Four times a day (QID) | ORAL | Status: DC | PRN
Start: 2013-10-21 — End: 2013-11-24

## 2013-10-21 MED ORDER — FUROSEMIDE 40 MG PO TABS: 40.0000 mg | ORAL_TABLET | Freq: Every day | ORAL | Status: DC

## 2013-10-21 MED ORDER — AMIODARONE HCL 200 MG PO TABS: 200.0000 mg | ORAL_TABLET | Freq: Every day | ORAL | Status: DC

## 2013-10-21 NOTE — Clinical Social Work Placement (Signed)
Clinical Social Work Department CLINICAL SOCIAL WORK PLACEMENT NOTE 10/21/2013  Patient:  Marissa Jefferson, Marissa Jefferson  Account Number:  [id number] [place] date:  10/06/2013  Clinical Social Worker:  Hunt Oris, Latanya Presser  Date/time:  10/13/2013 11:36 AM  Clinical Social Work is seeking post-discharge placement for this patient at the following level of care:   [place]   (*CSW will update this form in Epic as items are completed)   10/13/2013  Patient/family provided with [place] Department of Clinical Social Work's list of facilities offering this level of care within the geographic area requested by the patient (or if unable, by the patient's family).  10/13/2013  Patient/family informed of their freedom to choose among providers that offer the needed level of care, that participate in Medicare, Medicaid or managed care program needed by the patient, have an available bed and are willing to accept the patient.  10/13/2013  Patient/family informed of [facility]' ownership interest in [facility], as well as of the fact that they are under no obligation to receive care at this facility.  PASARR submitted to EDS on 10/13/2013 PASARR number received on   FL2 transmitted to all facilities in geographic area requested by pt/family on  10/13/2013 FL2 transmitted to all facilities within larger geographic area on 10/13/2013  Patient informed that his/her managed care company has contracts with or will negotiate with  certain facilities, including the following:     Patient/family informed of bed offers received:  10/19/2013 Patient chooses bed at [facility] Physician recommends and patient chooses bed at    Patient to be transferred to [facility] on  10/21/2013 Patient to be transferred to facility by Ambulance Patient and family notified of transfer on 10/21/2013 Name of family member notified:  Patient states she will contact her family and friends  The following physician  request were entered in Epic:   Additional Comments: Per MD patient ready for DC to [facility]. RN, patient, patient's family, and facility notified of DC. RN given number for report. DC packet on chart. [facility] transport requested for patient. CSW signing off.    Liz Beach MSW, [place], [place], [phone]

## 2013-10-21 NOTE — Progress Notes (Signed)
IV and tele removed, pt dressed Rickard Rhymes, RN

## 2013-10-21 NOTE — Progress Notes (Signed)
Chest tube sutures removed per order and protocol, steri strips applied, pt educated about incision care Rickard Rhymes, RN

## 2013-10-21 NOTE — Progress Notes (Signed)
[id number] Cardiac Rehab Completed discharge education with pt. She voices understanding. Pt agrees to [facility]. [facility] in [place] will send referral.Pt has been reluctant to walk more than once a day. I have encouraged her that it is very important to walk at least three times a day. We discussed that she was going to be tired, but she would have to push herself.Assisted pt to [facility]  With call light in reach. Deon Pilling, RN 10/21/2013 10:04 AM

## 2013-10-21 NOTE — Progress Notes (Addendum)
      12 Days Post-Op Procedure(s) (LRB): CORONARY ARTERY BYPASS GRAFTING (CABG) x 4 using left internal mammary artery and right saphenous leg vein using endoscope. ([name]) INTRAOPERATIVE TRANSESOPHAGEAL ECHOCARDIOGRAM ([name]) Subjective: Feels well, now off O2  Objective: Vital signs in last 24 hours: Temp:  [97.9 F (36.6 C)-98.8 F (37.1 C)] 98.5 F (36.9 C) (08/12 0422) Pulse Rate:  [75-89] 75 (08/12 0422) Cardiac Rhythm:  [-] Normal sinus rhythm (08/11 1945) Resp:  [18-20] 18 (08/12 0422) BP: (124-140)/(53-94) 133/67 mmHg (08/12 0422) SpO2:  [93 %-97 %] 96 % (08/12 0422) Weight:  [158 lb 11.7 oz (72 kg)] 158 lb 11.7 oz (72 kg) (08/12 0422)  Hemodynamic parameters for last 24 hours:    Intake/Output from previous day: 08/11 0701 - 08/12 0700 In: -  Out: 300 [Urine:300] Intake/Output this shift:    General appearance: alert, cooperative and no distress Heart: regular rate and rhythm Lungs: dim in left base Abdomen: benign Extremities: trace edema Wound: incis healing well  Lab Results: No results found for this basename: WBC, HGB, HCT, PLT,  in the last 72 hours BMET:  Recent Labs  10/19/13 0517  NA 135*  K 4.2  CL 93*  CO2 30  GLUCOSE 110*  BUN 17  CREATININE 0.65  CALCIUM 8.8    PT/INR: No results found for this basename: LABPROT, INR,  in the last 72 hours ABG    Component Value Date/Time   PHART 7.442 10/10/2013 0904   HCO3 24.0 10/10/2013 0904   TCO2 22 10/10/2013 1642   ACIDBASEDEF 1.0 10/09/2013 2217   O2SAT 48.7 10/14/2013 1250   CBG (last 3)   Recent Labs  10/20/13 1642 10/20/13 2122 10/21/13 0625  GLUCAP 131* 114* 138*   Scheduled Meds: . amiodarone  200 mg Oral Daily  . aspirin EC  325 mg Oral Daily   Or  . aspirin  324 mg Per Tube Daily  . atorvastatin  80 mg Oral Daily  . carvedilol  3.125 mg Oral BID WC  . feeding supplement (GLUCERNA SHAKE)  237 mL Oral BID BM  . ferrous sulfate  325 mg Oral Q breakfast  . furosemide  40 mg Oral  Daily  . hydrochlorothiazide  12.5 mg Oral Daily   And  . lisinopril  20 mg Oral Daily  . insulin aspart  0-15 Units Subcutaneous TID WC  . levothyroxine  112 mcg Oral QAC breakfast  . magnesium oxide  400 mg Oral BID  . metFORMIN  1,000 mg Oral BID WC  . oxybutynin  30 mg Oral QHS  . pantoprazole  40 mg Oral Daily  . sodium chloride  3 mL Intravenous Q12H   Continuous Infusions:  PRN Meds:.sodium chloride, acetaminophen, alum & mag hydroxide-simeth, guaiFENesin-dextromethorphan, magnesium hydroxide, ondansetron (ZOFRAN) IV, sodium chloride, traMADol  No results found. Assessment/Plan: S/P Procedure(s) (LRB): CORONARY ARTERY BYPASS GRAFTING (CABG) x 4 using left internal mammary artery and right saphenous leg vein using endoscope. ([name]) INTRAOPERATIVE TRANSESOPHAGEAL ECHOCARDIOGRAM ([name]) Plan for discharge: see discharge orders She conts to make good progress Cardiology  suggests  to increase coreg- will go to 6.25 bid  LOS: 15 days    Marissa Jefferson 10/21/2013

## 2013-10-21 NOTE — Progress Notes (Signed)
Discharge education, prescriptions, follow-up appts, and medication given to pt, pt stated understanding and that she had no questions, pt asked if the facility would manage her medicine, social work called to arrange pick-up time and awaiting call back, family aware of pending transfer Rickard Rhymes, RN

## 2013-10-21 NOTE — Progress Notes (Signed)
Physical Therapy Treatment Patient Details Name: Marissa Jefferson MRN: [id number] DOB: 12/23/1924 Today's Date: 10/21/2013    History of Present Illness Pt s/p CABG x 4 on 7/31. Pt with recent dx of CHF. PMH - HTN, DM.    PT Comments    Pt self-limiting this date adamantly refusing ambulation or even standing there ex despite maximal education on importance of activity for recovery from her surgery. Pt reported "i'm just so tired from yesterday, I just don't think I can do it." Pt educated on how she will undergo 2-3 hours of therapy daily at [facility] and that she will need to participate. Pt aware and repeatedly asked questions about [facility]. Pt with no recall or comprehension of sternal precautions.   Follow Up Recommendations  SNF     Equipment Recommendations       Recommendations for Other Services       Precautions / Restrictions Precautions Precautions: Sternal;Fall Precaution Comments: pt with no recall of sternal precautions and no understanding on how to be compliant Restrictions Weight Bearing Restrictions: No (limited UE use due to sternal prec)    Mobility  Bed Mobility Overal bed mobility: Needs Assistance Bed Mobility: Sit to Supine       Sit to supine: Min assist   General bed mobility comments: minA to bring LEs back into bed. max directional v/c's for technique (sidelying then rolling over) to adhere to sternal precautions  Transfers Overall transfer level: Needs assistance Equipment used: Rolling walker (2 wheeled) Transfers: Sit to/from [facility] Sit to Stand: Max assist (from low level surface and adherence of sternal prec) Stand pivot transfers: Min guard       General transfer comment: pt c/o fatigue, maxA to stand from chair due to pt desiring to pull up from walker despite max v/c's, required 2 attempts to achieve standing, max verbal and tactile cues for walker management  Ambulation/Gait             General  Gait Details: pt adamantly refused ambulation this date   Stairs            Wheelchair Mobility    Modified Rankin (Stroke Patients Only)       Balance                                    Cognition Arousal/Alertness: Awake/alert Behavior During Therapy: WFL for tasks assessed/performed Overall Cognitive Status: Within Functional Limits for tasks assessed                      Exercises General Exercises - Lower Extremity Ankle Circles/Pumps: AROM;Strengthening;Both;10 reps;Seated Long Arc Quad: AROM;Both;10 reps;Seated Heel Slides: AROM;Both;10 reps;Seated    General Comments General comments (skin integrity, edema, etc.): strongly encouraged OOB Mobility/ther ex, pt adamantly refused due to "i'm so tired"      Pertinent Vitals/Pain Pain Assessment: No/denies pain    Home Living                      Prior Function            PT Goals (current goals can now be found in the care plan section) Progress towards PT goals: Progressing toward goals    Frequency  Min 3X/week    PT Plan Current plan remains appropriate    Co-evaluation  End of Session   Activity Tolerance: Patient limited by fatigue Patient left: in bed;with call bell/phone within reach     Time: 0842-0908 PT Time Calculation (min): 26 min  Charges:  $Therapeutic Exercise: 8-22 mins $Therapeutic Activity: 8-22 mins                    G Codes:      Kingsley Callander 10/21/2013, 9:15 AM  Kittie Plater, PT, DPT Pager #: [phone] Office #: [phone]

## 2013-10-21 NOTE — Progress Notes (Signed)
Reviewed CHF medications - appropriate. Still having ventricular ectopy. Can tolerate increase in coreg up to 6.25 mg BID prior to d/c.  Otherwise, no suggestions. Follow-up with Dr. Sallyanne Kuster in the office.  Pixie Casino, MD, [facility] Attending Cardiologist [place]

## 2013-10-21 NOTE — Progress Notes (Signed)
Report called to Kathlee Nations at [facility], Karren Burly, RN

## 2013-10-22 ENCOUNTER — Non-Acute Institutional Stay (SKILLED_NURSING_FACILITY): Payer: Medicare Other | Admitting: Adult Health

## 2013-10-22 ENCOUNTER — Encounter: Payer: Self-pay | Admitting: Adult Health

## 2013-10-22 DIAGNOSIS — K219 Gastro-esophageal reflux disease without esophagitis: Secondary | ICD-10-CM | POA: Insufficient documentation

## 2013-10-22 DIAGNOSIS — I2510 Atherosclerotic heart disease of native coronary artery without angina pectoris: Secondary | ICD-10-CM

## 2013-10-22 DIAGNOSIS — E039 Hypothyroidism, unspecified: Secondary | ICD-10-CM | POA: Insufficient documentation

## 2013-10-22 DIAGNOSIS — E119 Type 2 diabetes mellitus without complications: Secondary | ICD-10-CM

## 2013-10-22 DIAGNOSIS — E785 Hyperlipidemia, unspecified: Secondary | ICD-10-CM | POA: Diagnosis not present

## 2013-10-22 DIAGNOSIS — I10 Essential (primary) hypertension: Secondary | ICD-10-CM

## 2013-10-22 DIAGNOSIS — I472 Ventricular tachycardia: Secondary | ICD-10-CM

## 2013-10-22 DIAGNOSIS — I4729 Other ventricular tachycardia: Secondary | ICD-10-CM

## 2013-10-22 DIAGNOSIS — I25119 Atherosclerotic heart disease of native coronary artery with unspecified angina pectoris: Secondary | ICD-10-CM

## 2013-10-22 DIAGNOSIS — Z951 Presence of aortocoronary bypass graft: Secondary | ICD-10-CM

## 2013-10-22 DIAGNOSIS — I5022 Chronic systolic (congestive) heart failure: Secondary | ICD-10-CM

## 2013-10-22 DIAGNOSIS — I209 Angina pectoris, unspecified: Secondary | ICD-10-CM

## 2013-10-22 DIAGNOSIS — N318 Other neuromuscular dysfunction of bladder: Secondary | ICD-10-CM

## 2013-10-22 DIAGNOSIS — N3281 Overactive bladder: Secondary | ICD-10-CM | POA: Insufficient documentation

## 2013-10-22 NOTE — Progress Notes (Signed)
Patient ID: Marissa Jefferson, female   DOB: December 27, 1924, 80 y.o.   MRN: [id number]               PROGRESS NOTE  DATE: 10/22/2013  FACILITY: Nursing Home Location: [facility]  LEVEL OF CARE: SNF (31)  Acute Visit  CHIEF COMPLAINT:  Follow-up Hospitalization  HISTORY OF PRESENT ILLNESS: This is an 80 year old female who has been admitted to [facility] on 10/21/13 from [facility] with Multivessel CAD S/P CABG X4. She has been admitted for a short-term rehabilitation.  REASSESSMENT OF ONGOING PROBLEM(S):  HTN: Pt 's HTN remains stable.  Denies CP, sob, DOE, pedal edema, headaches, dizziness or visual disturbances.  No complications from the medications currently being used.  Last BP : 125/71  HYPERLIPIDEMIA: No complications from the medications presently being used. 7/15 fasting lipid panel showed : Cholesterol 148 triglycerides 111 HDL 40 LDL 86  CHF:The patient does not relate significant weight changes, denies sob, DOE, orthopnea, PNDs, pedal edema, palpitations or chest pain.  CHF remains stable.  No complications form the medications being used.   PAST MEDICAL HISTORY : Reviewed.  No changes/see problem list  CURRENT MEDICATIONS: Reviewed per MAR/see medication list  REVIEW OF SYSTEMS:  GENERAL: no change in appetite, no fatigue, no weight changes, no fever, chills or weakness RESPIRATORY: no cough, SOB, DOE, wheezing, hemoptysis CARDIAC: no chest pain, or palpitations GI: no abdominal pain, diarrhea, constipation, heart burn, nausea or vomiting  PHYSICAL EXAMINATION  GENERAL: no acute distress, normal body habitus EYES: conjunctivae normal, sclerae normal, normal eye lids NECK: supple, trachea midline, no neck masses, no thyroid tenderness, no thyromegaly LYMPHATICS: no LAN in the neck, no supraclavicular LAN RESPIRATORY: breathing is even & unlabored, BS CTAB CARDIAC: RRR, no murmur,no extra heart sounds, BLE edema 1+, chest midline surgical  incision is dry, no redness GI: abdomen soft, normal BS, no masses, no tenderness, no hepatomegaly, no splenomegaly EXTREMITIES: able to move all 4 extremities PSYCHIATRIC: the patient is alert & oriented to person, affect & behavior appropriate  LABS/RADIOLOGY: Labs reviewed: Basic Metabolic Panel:  Recent Labs  10/10/13 0350 10/10/13 1629  10/16/13 0312 10/18/13 0400 10/19/13 0517 10/19/13 0547  NA 138  --   < > 132* 129* 135*  --   K 3.9  --   < > 5.1 4.8 4.2  --   CL 104  --   < > 95* 89* 93*  --   CO2 24  --   < > 26 27 30   --   GLUCOSE 130*  --   < > 123* 118* 110*  --   BUN 16  --   < > 21 26* 17  --   CREATININE 0.59 0.75  < > 0.69 0.85 0.65  --   CALCIUM 8.6  --   < > 8.8 9.0 8.8  --   MG 2.0 1.8  --   --   --   --  1.0*  < > = values in this interval not displayed. Liver Function Tests:  Recent Labs  10/07/13 1935 10/13/13 0350  AST 15 36  ALT 10 55*  ALKPHOS 50 52  BILITOT 0.4 0.6  PROT 6.8 5.1*  ALBUMIN 3.9 2.6*   CBC:  Recent Labs  10/14/13 0419 10/15/13 0303 10/16/13 0312  WBC 10.4 9.9 10.2  HGB 8.9* 8.8* 9.4*  HCT 27.0* 27.1* 28.2*  MCV 97.5 97.8 97.2  PLT 257 268 307   Lipid Panel:  Recent Labs  10/07/13 0350  HDL 40   CBG:  Recent Labs  10/20/13 1642 10/20/13 2122 10/21/13 0625  GLUCAP 131* 114* 138*   EXAM: CT CHEST WITHOUT CONTRAST   TECHNIQUE: Multidetector CT imaging of the chest was performed following the standard protocol without IV contrast.   COMPARISON:  None.   FINDINGS: No pathologically enlarged mediastinal or axillary lymph nodes. Hilar regions are difficult to definitively evaluate without IV contrast. Aorta is moderately calcified. Extensive 3 vessel coronary artery calcification. Heart is mildly enlarged. No pericardial effusion. Small hiatal hernia. Favor a dilated left inferior pulmonary vein (series 2, image 32), rather than an enlarged lymph node.   Tiny right pleural effusion. Mild biapical  pleural parenchymal scarring with subpleural extension inferiorly. Triangular-shaped nodule in the right lower lobe, adjacent to the right major fissure measures 5 mm (4 x 5 mm). Airway is unremarkable.   Incidental imaging of the upper abdomen shows the visualized portions of the liver, adrenal glands, kidneys, spleen and stomach to be otherwise grossly unremarkable. No worrisome lytic or sclerotic lesions. Degenerative changes are seen in the spine.   IMPRESSION: 1. Atherosclerotic calcification of the arterial vasculature, including extensive three-vessel involvement of the coronary arteries. 2. Tiny right pleural effusion. 3. 5 mm subpleural nodule along the right major fissure may represent a subpleural lymph node. If the patient is at high risk for bronchogenic carcinoma, follow-up chest CT at 6-12 months is recommended. If the patient is at low risk for bronchogenic carcinoma, follow-up chest CT at 12 months is recommended. This recommendation follows the consensus statement: Guidelines for Management of Small Pulmonary Nodules Detected on CT Scans: A Statement from the [employer] as published in Radiology 2005;237:395-400. EXAM: CHEST  2 VIEW   COMPARISON:  10/15/2013   FINDINGS: Sequelae of CABG are again identified. Cardiomediastinal silhouette is unchanged with thoracic aortic calcification noted. Small bilateral pleural effusions do not appear significantly changed. The lungs are well inflated without evidence of confluent airspace opacity, pulmonary edema, or pneumothorax. Surgical clips are noted in the left axilla.   IMPRESSION: Unchanged, small bilateral pleural effusions.   ASSESSMENT/PLAN:  Multivessel CAD S/P CABG X4 - for rehabilitation NSVT (Nonsustained ventricular tachycardia) - rate-controlled; continue Amiodarone Hyperlipidemia - continue atorvastatin Chronic systolic heart failure - stable; continue Lasix Hypothyroidism - continue  Synthroid Overactive bladder - continue oxybutynin GERD - stable; continue Protonix Diabetes mellitus, type II - well controlled; continue Glucophage   CPT CODE: 61443  Necia Kamm Vargas - NP [facility] [phone]

## 2013-10-27 ENCOUNTER — Non-Acute Institutional Stay (SKILLED_NURSING_FACILITY): Payer: Medicare Other | Admitting: Internal Medicine

## 2013-10-27 DIAGNOSIS — E1059 Type 1 diabetes mellitus with other circulatory complications: Secondary | ICD-10-CM

## 2013-10-27 DIAGNOSIS — D62 Acute posthemorrhagic anemia: Secondary | ICD-10-CM

## 2013-10-27 DIAGNOSIS — I5022 Chronic systolic (congestive) heart failure: Secondary | ICD-10-CM

## 2013-10-27 DIAGNOSIS — I2510 Atherosclerotic heart disease of native coronary artery without angina pectoris: Secondary | ICD-10-CM

## 2013-10-27 DIAGNOSIS — I209 Angina pectoris, unspecified: Secondary | ICD-10-CM

## 2013-10-27 DIAGNOSIS — I25119 Atherosclerotic heart disease of native coronary artery with unspecified angina pectoris: Secondary | ICD-10-CM

## 2013-10-29 NOTE — Progress Notes (Signed)
HISTORY & PHYSICAL  DATE: 10/27/2013   FACILITY: [facility]  LEVEL OF CARE: SNF (31)  ALLERGIES:  No Known Allergies  CHIEF COMPLAINT:  Manage CAD, CHF & DM  HISTORY OF PRESENT ILLNESS: 80 y/o Caucasian female is admitted to this facility for short-term rehabilitation after her recent hospitalization.  CAD: The angina has been stable. The patient denies dyspnea on exertion, orthopnea, pedal edema, palpitations and paroxysmal nocturnal dyspnea. No complications noted from the medication presently being used. Patient underwent CABG x4 and tolerated the procedure well.  CHF:The patient does not relate significant weight changes, denies sob, DOE, orthopnea, PNDs, pedal edema, palpitations or chest pain.  CHF remains stable.  No complications form the medications being used. EF 20-25%.  DM:pt's DM remains stable.  Pt denies polyuria, polydipsia, polyphagia, changes in vision or hypoglycemic episodes.  No complications noted from the medication presently being used.  Last hemoglobin A1c is: 6.8.  PAST MEDICAL HISTORY :  Past Medical History  Diagnosis Date  . DM2 (diabetes mellitus, type 2)   . Hypertension   . Hyperlipidemia   . GERD (gastroesophageal reflux disease)   . Osteopenia   . CAD (coronary artery disease), native coronary artery 10/06/2013    LAD 90%, CFX 90%, RCA 100% w/ collat  . Ischemic dilated cardiomyopathy 09/2013    EF 20-25% by echo  . Chronic combined systolic and diastolic CHF, NYHA class 2 09/2013  . NSVT (nonsustained ventricular tachycardia) 09/2013  . OAB (overactive bladder)   . Hypothyroidism   . CVA (cerebral vascular accident) 1968    leaving no deficit  . Arthritis     "knees" (10/08/2013)  . Breast cancer     "left; I had 62 sessions of radiation"    PAST SURGICAL HISTORY: Past Surgical History  Procedure Laterality Date  . Colonoscopy  2005  . Cardiac catheterization  10/06/2013  . Cataract extraction w/  intraocular lens  implant, bilateral Bilateral ~ 2009  . Tonsillectomy and adenoidectomy  1930's  . Breast lumpectomy Left 1998  . Breast lumpectomy with axillary lymph node dissection Left 1998  . Coronary artery bypass graft [name] 10/09/2013    Procedure: CORONARY ARTERY BYPASS GRAFTING (CABG) x 4 using left internal mammary artery and right saphenous leg vein using endoscope.;  Surgeon: Melrose Nakayama, MD;  Location: [place];  Service: Open Heart Surgery;  Laterality: [name];  . Intraoperative transesophageal echocardiogram [name] 10/09/2013    Procedure: INTRAOPERATIVE TRANSESOPHAGEAL ECHOCARDIOGRAM;  Surgeon: Melrose Nakayama, MD;  Location: [facility];  Service: Open Heart Surgery;  Laterality: [name];    SOCIAL HISTORY:  reports that she has quit smoking. Her smoking use included Cigarettes. She smoked 0.00 packs per day. She has never used smokeless tobacco. She reports that she does not drink alcohol or use illicit drugs.  FAMILY HISTORY: None  CURRENT MEDICATIONS: Reviewed per MAR/see medication list  REVIEW OF SYSTEMS:  See HPI otherwise 14 point ROS is negative.  PHYSICAL EXAMINATION  VS:  See VS section  GENERAL: no acute distress, normal body habitus EYES: conjunctivae normal, sclerae normal, normal eye lids MOUTH/THROAT: lips without lesions,no lesions in the mouth,tongue is without lesions,uvula elevates in midline NECK: supple, trachea midline, no neck masses, no thyroid tenderness, no thyromegaly LYMPHATICS: no LAN in the neck, no supraclavicular LAN RESPIRATORY: breathing is even & unlabored, BS CTAB CARDIAC: RRR, no murmur,no extra heart sounds, no edema GI:  ABDOMEN: abdomen soft, normal BS,  no masses, no tenderness  LIVER/SPLEEN: no hepatomegaly, no splenomegaly MUSCULOSKELETAL: HEAD: normal to inspection  EXTREMITIES: LEFT UPPER EXTREMITY:  range of motion not allowed secondary to surgery, normal strength & tone RIGHT UPPER EXTREMITY:  range of motion not allowed  secondary to surgery, normal strength & tone LEFT LOWER EXTREMITY:  full range of motion, normal strength & tone RIGHT LOWER EXTREMITY:  Minimal range of motion, normal strength & tone PSYCHIATRIC: the patient is alert & oriented to person, affect & behavior appropriate  LABS/RADIOLOGY:  Labs reviewed: Basic Metabolic Panel:  Recent Labs  10/10/13 0350 10/10/13 1629  10/16/13 0312 10/18/13 0400 10/19/13 0517 10/19/13 0547  NA 138  --   < > 132* 129* 135*  --   K 3.9  --   < > 5.1 4.8 4.2  --   CL 104  --   < > 95* 89* 93*  --   CO2 24  --   < > 26 27 30   --   GLUCOSE 130*  --   < > 123* 118* 110*  --   BUN 16  --   < > 21 26* 17  --   CREATININE 0.59 0.75  < > 0.69 0.85 0.65  --   CALCIUM 8.6  --   < > 8.8 9.0 8.8  --   MG 2.0 1.8  --   --   --   --  1.0*  < > = values in this interval not displayed. Liver Function Tests:  Recent Labs  10/07/13 1935 10/13/13 0350  AST 15 36  ALT 10 55*  ALKPHOS 50 52  BILITOT 0.4 0.6  PROT 6.8 5.1*  ALBUMIN 3.9 2.6*   CBC:  Recent Labs  10/14/13 0419 10/15/13 0303 10/16/13 0312  WBC 10.4 9.9 10.2  HGB 8.9* 8.8* 9.4*  HCT 27.0* 27.1* 28.2*  MCV 97.5 97.8 97.2  PLT 257 268 307   Lipid Panel:  Recent Labs  10/07/13 0350  HDL 40   CBG:  Recent Labs  10/20/13 1642 10/20/13 2122 10/21/13 0625  GLUCAP 131* 114* 138*    Transthoracic Echocardiography  Patient:    Anari, Evitt MR #:       [id number] Study Date: 09/15/2013 Gender:     F Age:        85 Height:     167.6 cm Weight:     70.8 kg BSA:        1.83 m^2 Pt. Status: Room:   Virl Cagey [address] [place] MD  SONOGRAPHER  Marygrace Drought, RCS  PERFORMING   [facility], Outpatient  cc:  ------------------------------------------------------------------- LV EF: 20% -   25%  ------------------------------------------------------------------- Indications:      785.2 Cardiac  murmur.  ------------------------------------------------------------------- History:   PMH:   Dyspnea.  Risk factors:  Hypertension.  ------------------------------------------------------------------- Study Conclusions  - Left ventricle: The cavity size was moderately dilated. Wall   thickness was increased in a pattern of mild LVH. Systolic   function was severely reduced. The estimated ejection fraction   was in the range of 20% to 25%. There was an increased relative   contribution of atrial contraction to ventricular filling. - Aortic valve: Valve area (Vmax): 2.03 cm^2. - Mitral valve: There was mild regurgitation. - Left atrium: The atrium was moderately dilated. - Pulmonary arteries: PA peak pressure: 32 mm Hg (S).  Transthoracic echocardiography.  M-mode, complete 2D, spectral Doppler, and color Doppler.  Birthdate:  Patient birthdate: 09/14/1924.  Age:  Patient is 80 yr old.  Sex:  Gender: female. Height:  Height: 167.6 cm. Height: 66 in.  Weight:  Weight: 70.8 kg. Weight: 155.7 lb.  Body mass index:  BMI: 25.2 kg/m^2.  Body surface area:    BSA: 1.83 m^2.  Blood pressure:     161/98 Patient status:  Outpatient.  Study date:  Study date: 09/15/2013. Study time: 10:06 AM.  Location:  Echo laboratory.  -------------------------------------------------------------------  ------------------------------------------------------------------- Left ventricle:  The cavity size was moderately dilated. Wall thickness was increased in a pattern of mild LVH. Systolic function was severely reduced. The estimated ejection fraction was in the range of 20% to 25%. There was an increased relative contribution of atrial contraction to ventricular filling.  ------------------------------------------------------------------- Aortic valve:   Doppler:   There was no stenosis.   There was no regurgitation.    Valve area (Vmax): 2.03 cm^2. Indexed valve area (Vmax): 1.11  cm^2/m^2.  ------------------------------------------------------------------- Aorta:  Aortic root: The aortic root was normal in size. Ascending aorta: The ascending aorta was normal in size.  ------------------------------------------------------------------- Mitral valve:   Mildly thickened leaflets .  Doppler:  There was mild regurgitation.  ------------------------------------------------------------------- Left atrium:  LA volume/ BSA= 28.3 ml/m2. The atrium was moderately dilated.  ------------------------------------------------------------------- Right ventricle:  The cavity size was normal. Systolic function was normal.  ------------------------------------------------------------------- Pulmonic valve:    The valve appears to be grossly normal. Doppler:  There was trivial regurgitation.  ------------------------------------------------------------------- Tricuspid valve:   The valve appears to be grossly normal. Doppler:  There was trivial regurgitation.  ------------------------------------------------------------------- Pulmonary artery:   Systolic pressure was within the normal range.   ------------------------------------------------------------------- Right atrium:  The atrium was normal in size.  ------------------------------------------------------------------- Pericardium:  There was no pericardial effusion.  ------------------------------------------------------------------- Systemic veins: Inferior vena cava: The vessel was normal in size. The respirophasic diameter changes were in the normal range (= 50%), consistent with normal central venous pressure. Diameter: 18 mm.  ------------------------------------------------------------------- Prepared and Electronically Authenticated by  Mertie Moores, M.D. 2015-07-07T17:53:42  ------------------------------------------------------------------- Measurements   IVC                                        Value          Reference  ID                                        18    mm       ---------    Left ventricle                            Value          Reference  LV ID, ED, PLAX chordal           (H)     57.2  mm       43 - 52  LV ID, ES, PLAX chordal           (H)     50.1  mm       23 - 38  LV fx shortening, PLAX chordal    (L)     12    %        >=  29  LV PW thickness, ED                       11    mm       ---------  IVS/LV PW ratio, ED               (N)     1.12           <=1.3  Stroke volume, 2D                         53    ml       ---------  Stroke volume/bsa, 2D                     29    ml/m^2   ---------  LV ejection fraction, 1-p A4C             22    %        ---------  LV end-diastolic volume, 2-p              118   ml       ---------  LV end-systolic volume, 2-p               97    ml       ---------  LV ejection fraction, 2-p                 18    %        ---------  Stroke volume, 2-p                        21    ml       ---------  LV end-diastolic volume/bsa, 2-p          65    ml/m^2   ---------  LV end-systolic volume/bsa, 2-p           53    ml/m^2   ---------  Stroke volume/bsa, 2-p                    11.5  ml/m^2   ---------  LV e&', lateral                            5.81  cm/s     ---------  LV E/e&', lateral                          9.95           ---------  LV e&', medial                             6.14  cm/s     ---------  LV E/e&', medial                           9.41           ---------  LV e&', average                            5.98  cm/s     ---------  LV E/e&', average  9.67           ---------    Ventricular septum                        Value          Reference  IVS thickness, ED                         12.3  mm       ---------    LVOT                                      Value          Reference  LVOT ID, S                                19    mm       ---------  LVOT area                                 2.84  cm^2      ---------    Aortic valve                              Value          Reference  Aortic valve peak velocity, S             147   cm/s     ---------  Aortic valve area, peak velocity          2.03  cm^2     ---------  Aortic valve area/bsa, peak               1.11  cm^2/m^2 ---------  velocity    Aorta                                     Value          Reference  Aortic root ID, ED                        32    mm       ---------    Left atrium                               Value          Reference  LA ID, A-P, ES                            45    mm       ---------  LA ID/bsa, A-P                    (H)     2.46  cm/m^2   <=2.2    Mitral valve  Value          Reference  Mitral E-wave peak velocity               57.8  cm/s     ---------  Mitral A-wave peak velocity               82.4  cm/s     ---------  Mitral deceleration time          (N)     180   ms       150 - 230  Mitral E/A ratio, peak                    0.7            ---------    Pulmonary arteries                        Value          Reference  PA pressure, S, DP                (H)     32    mm Hg    <=30    Tricuspid valve                           Value          Reference  Tricuspid regurg peak velocity            271   cm/s     ---------  Tricuspid peak RV-RA gradient             29    mm Hg    ---------  Tricuspid maximal regurg                  271   cm/s     ---------  velocity, PISA    Systemic veins                            Value          Reference  Estimated CVP                             3     mm Hg    ---------    Right ventricle                           Value          Reference  RV pressure, S, DP                (H)     32    mm Hg    <=30  RV s&', lateral, S                         9.65  cm/s     ---------  CT CHEST WITHOUT CONTRAST   TECHNIQUE: Multidetector CT imaging of the chest was performed following the standard protocol without IV contrast.   COMPARISON:  None.    FINDINGS: No pathologically enlarged mediastinal or axillary lymph nodes. Hilar regions are difficult to definitively evaluate without IV contrast. Aorta is moderately calcified. Extensive 3 vessel coronary artery calcification. Heart is mildly enlarged. No pericardial effusion.  Small hiatal hernia. Favor a dilated left inferior pulmonary vein (series 2, image 32), rather than an enlarged lymph node.   Tiny right pleural effusion. Mild biapical pleural parenchymal scarring with subpleural extension inferiorly. Triangular-shaped nodule in the right lower lobe, adjacent to the right major fissure measures 5 mm (4 x 5 mm). Airway is unremarkable.   Incidental imaging of the upper abdomen shows the visualized portions of the liver, adrenal glands, kidneys, spleen and stomach to be otherwise grossly unremarkable. No worrisome lytic or sclerotic lesions. Degenerative changes are seen in the spine.   IMPRESSION: 1. Atherosclerotic calcification of the arterial vasculature, including extensive three-vessel involvement of the coronary arteries. 2. Tiny right pleural effusion. 3. 5 mm subpleural nodule along the right major fissure may represent a subpleural lymph node. If the patient is at high risk for bronchogenic carcinoma, follow-up chest CT at 6-12 months is recommended. If the patient is at low risk for bronchogenic carcinoma, follow-up chest CT at 12 months is recommended. This recommendation follows the consensus statement: Guidelines for Management of Small Pulmonary Nodules Detected on CT Scans: A Statement from the [place] as published in Radiology 2005;237:395-400.     CHEST  2 VIEW   COMPARISON:  10/15/2013   FINDINGS: Sequelae of CABG are again identified. Cardiomediastinal silhouette is unchanged with thoracic aortic calcification noted. Small bilateral pleural effusions do not appear significantly changed. The lungs are well inflated without evidence of  confluent airspace opacity, pulmonary edema, or pneumothorax. Surgical clips are noted in the left axilla.   IMPRESSION: Unchanged, small bilateral pleural effusions  ASSESSMENT/PLAN:  CAD-status post CABG. Continue rehabilitation. CHF-compensated Diabetes mellitus with vascular complications-well controlled. Acute blood loss anemia-check hemoglobin. Continue iron.  Hypothyroidism-continue levothyroxine Hypertension-well-controlled Atrial fibrillation-rate controlled Check CBC and BMP  I have reviewed patient's medical records received at admission/from hospitalization.  CPT CODE: 41638  Gayani Y Dasanayaka, [place] [phone]

## 2013-11-10 DIAGNOSIS — R279 Unspecified lack of coordination: Secondary | ICD-10-CM | POA: Diagnosis not present

## 2013-11-10 DIAGNOSIS — E119 Type 2 diabetes mellitus without complications: Secondary | ICD-10-CM | POA: Diagnosis not present

## 2013-11-10 DIAGNOSIS — N318 Other neuromuscular dysfunction of bladder: Secondary | ICD-10-CM | POA: Diagnosis not present

## 2013-11-10 DIAGNOSIS — I209 Angina pectoris, unspecified: Secondary | ICD-10-CM | POA: Diagnosis not present

## 2013-11-10 DIAGNOSIS — D649 Anemia, unspecified: Secondary | ICD-10-CM | POA: Diagnosis not present

## 2013-11-10 DIAGNOSIS — E039 Hypothyroidism, unspecified: Secondary | ICD-10-CM | POA: Diagnosis not present

## 2013-11-10 DIAGNOSIS — I10 Essential (primary) hypertension: Secondary | ICD-10-CM | POA: Diagnosis not present

## 2013-11-10 DIAGNOSIS — Z9889 Other specified postprocedural states: Secondary | ICD-10-CM | POA: Diagnosis not present

## 2013-11-10 DIAGNOSIS — I428 Other cardiomyopathies: Secondary | ICD-10-CM | POA: Diagnosis not present

## 2013-11-10 DIAGNOSIS — I2510 Atherosclerotic heart disease of native coronary artery without angina pectoris: Secondary | ICD-10-CM | POA: Diagnosis not present

## 2013-11-10 DIAGNOSIS — E785 Hyperlipidemia, unspecified: Secondary | ICD-10-CM | POA: Diagnosis not present

## 2013-11-10 DIAGNOSIS — I2589 Other forms of chronic ischemic heart disease: Secondary | ICD-10-CM | POA: Diagnosis not present

## 2013-11-10 DIAGNOSIS — I472 Ventricular tachycardia, unspecified: Secondary | ICD-10-CM | POA: Diagnosis not present

## 2013-11-10 DIAGNOSIS — R0602 Shortness of breath: Secondary | ICD-10-CM | POA: Diagnosis not present

## 2013-11-10 DIAGNOSIS — I509 Heart failure, unspecified: Secondary | ICD-10-CM | POA: Diagnosis not present

## 2013-11-10 DIAGNOSIS — I4891 Unspecified atrial fibrillation: Secondary | ICD-10-CM | POA: Diagnosis not present

## 2013-11-10 DIAGNOSIS — R269 Unspecified abnormalities of gait and mobility: Secondary | ICD-10-CM | POA: Diagnosis not present

## 2013-11-10 DIAGNOSIS — Z951 Presence of aortocoronary bypass graft: Secondary | ICD-10-CM | POA: Diagnosis not present

## 2013-11-10 DIAGNOSIS — Z853 Personal history of malignant neoplasm of breast: Secondary | ICD-10-CM | POA: Diagnosis not present

## 2013-11-10 DIAGNOSIS — K219 Gastro-esophageal reflux disease without esophagitis: Secondary | ICD-10-CM | POA: Diagnosis not present

## 2013-11-10 DIAGNOSIS — Z48812 Encounter for surgical aftercare following surgery on the circulatory system: Secondary | ICD-10-CM | POA: Diagnosis not present

## 2013-11-10 DIAGNOSIS — Z8673 Personal history of transient ischemic attack (TIA), and cerebral infarction without residual deficits: Secondary | ICD-10-CM | POA: Diagnosis not present

## 2013-11-10 DIAGNOSIS — J90 Pleural effusion, not elsewhere classified: Secondary | ICD-10-CM | POA: Diagnosis not present

## 2013-11-10 DIAGNOSIS — M6281 Muscle weakness (generalized): Secondary | ICD-10-CM | POA: Diagnosis not present

## 2013-11-12 ENCOUNTER — Encounter: Payer: Self-pay | Admitting: Cardiology

## 2013-11-12 ENCOUNTER — Ambulatory Visit (INDEPENDENT_AMBULATORY_CARE_PROVIDER_SITE_OTHER): Payer: Medicare Other | Admitting: Cardiology

## 2013-11-12 VITALS — BP 120/58 | HR 68 | Ht 66.0 in | Wt 150.4 lb

## 2013-11-12 DIAGNOSIS — I428 Other cardiomyopathies: Secondary | ICD-10-CM

## 2013-11-12 DIAGNOSIS — I420 Dilated cardiomyopathy: Secondary | ICD-10-CM

## 2013-11-12 DIAGNOSIS — I2589 Other forms of chronic ischemic heart disease: Secondary | ICD-10-CM

## 2013-11-12 DIAGNOSIS — Z951 Presence of aortocoronary bypass graft: Secondary | ICD-10-CM

## 2013-11-12 DIAGNOSIS — E119 Type 2 diabetes mellitus without complications: Secondary | ICD-10-CM

## 2013-11-12 DIAGNOSIS — I429 Cardiomyopathy, unspecified: Secondary | ICD-10-CM

## 2013-11-12 DIAGNOSIS — I10 Essential (primary) hypertension: Secondary | ICD-10-CM | POA: Diagnosis not present

## 2013-11-12 DIAGNOSIS — I255 Ischemic cardiomyopathy: Secondary | ICD-10-CM

## 2013-11-12 NOTE — Patient Instructions (Signed)
STOP Lasix  Your physician has requested that you have an echocardiogram. Echocardiography is a painless test that uses sound waves to create images of your heart. It provides your doctor with information about the size and shape of your heart and how well your heart's chambers and valves are working. This procedure takes approximately one hour. There are no restrictions for this procedure. November 2015  Follow up with Dr.Croitoru after you have the Echo in November (ten weeks)

## 2013-11-17 NOTE — Assessment & Plan Note (Signed)
Controlled.  

## 2013-11-17 NOTE — Assessment & Plan Note (Signed)
Pt doing well in rehab

## 2013-11-17 NOTE — Progress Notes (Signed)
11/17/2013 Marissa Jefferson   05/15/1924  [id number]  Primary Physicia Thressa Sheller, MD Primary Cardiologist: Dr Sallyanne Kuster  HPI: Pt is an 80 year old Caucasian female diabetic, HTN, nonsmoker with history of left breast cancer treated with radiation-lumpectomy 15 years ago. She was seen for CHF 10/05/13. Her echo showed an EF of 20-25%.  She was admitted for outpatient right and left heart catheterization for symptoms of class III CHF on 10/06/13.  This revealed severe 3V CAD and she underwent CABG x 4 10/09/13. Post op she had PAF Rx'd with Amiodarone. She was discharged to SNF for rehab. Since discharge she has gradually been progressing. She has another week scheduled before discharge.     Current Outpatient Prescriptions  Medication Sig Dispense Refill  . acetaminophen (TYLENOL) 325 MG tablet Take 650 mg by mouth every 6 (six) hours as needed.      Marland Kitchen amiodarone (PACERONE) 200 MG tablet Take 1 tablet (200 mg total) by mouth daily.      Marland Kitchen aspirin EC 325 MG EC tablet Take 1 tablet (325 mg total) by mouth daily.  30 tablet  0  . atorvastatin (LIPITOR) 80 MG tablet Take 1 tablet by mouth daily.      . carvedilol (COREG) 6.25 MG tablet Take 1 tablet (6.25 mg total) by mouth 2 (two) times daily with a meal.      . cholecalciferol (VITAMIN D) 1000 UNITS tablet Take 1,000 Units by mouth daily.      . feeding supplement, GLUCERNA SHAKE, (GLUCERNA SHAKE) LIQD Take 237 mLs by mouth 2 (two) times daily between meals.    0  . ferrous sulfate 325 (65 FE) MG tablet Take 1 tablet (325 mg total) by mouth daily with breakfast.    3  . furosemide (LASIX) 40 MG tablet Take 1 tablet (40 mg total) by mouth daily.  30 tablet    . levothyroxine (SYNTHROID, LEVOTHROID) 112 MCG tablet Take 1 tablet by mouth daily.      Marland Kitchen lisinopril-hydrochlorothiazide (PRINZIDE,ZESTORETIC) 20-12.5 MG per tablet Take 2 tablets by mouth daily.      . magnesium oxide (MAG-OX) 400 (241.3 MG) MG tablet Take 1 tablet (400 mg total) by  mouth 2 (two) times daily.  14 tablet  0  . metFORMIN (GLUCOPHAGE) 1000 MG tablet Take 1 tablet by mouth 2 (two) times daily.      Marland Kitchen oxybutynin (DITROPAN XL) 15 MG 24 hr tablet Take 2 tablets by mouth daily.      . pantoprazole (PROTONIX) 40 MG tablet Take 1 tablet by mouth daily.      . traMADol (ULTRAM) 50 MG tablet Take 1-2 tablets (50-100 mg total) by mouth every 6 (six) hours as needed (pain).  50 tablet  0  . VOLTAREN 1 % GEL Apply 1 application topically daily.       No current facility-administered medications for this visit.    No Known Allergies  History   Social History  . Marital Status: Widowed    Spouse Name: [name]    Number of Children: [name]  . Years of Education: [name]   Occupational History  . Not on file.   Social History Main Topics  . Smoking status: Former Smoker    Types: Cigarettes  . Smokeless tobacco: Never Used     Comment: "smoked in college; not much; not long"  . Alcohol Use: No  . Drug Use: No  . Sexual Activity: Not Currently   Other Topics Concern  . Not on file  Social History Narrative  . No narrative on file     Review of Systems: General: negative for chills, fever, night sweats or weight changes.  Cardiovascular: negative for chest pain, dyspnea on exertion, edema, orthopnea, palpitations, paroxysmal nocturnal dyspnea or shortness of breath Dermatological: negative for rash Respiratory: negative for cough or wheezing Urologic: negative for hematuria Abdominal: negative for nausea, vomiting, diarrhea, bright red blood per rectum, melena, or hematemesis Neurologic: negative for visual changes, syncope, or dizziness All other systems reviewed and are otherwise negative except as noted above.    Blood pressure 120/58, pulse 68, height 5\' 6"  (1.676 m), weight 150 lb 6.4 oz (68.221 kg).  General appearance: alert, cooperative, no distress and in wheel chair Lungs: clear to auscultation bilaterally Heart: regular rate and  rhythm Extremities: no edema  EKG NSR  ASSESSMENT AND PLAN:   S/P CABG x 4 Pt doing well in rehab  Ischemic dilated cardiomyopathy EF 20-25% by echo pre op.   HTN (hypertension) Controlled  Diabetes mellitus type 2, controlled .   PLAN  I think she can stop her Lasix. She is to see Dr Roxan Hockey in a week. We'll have her see Dr Sallyanne Kuster in 3 months. She will continue Amiodarone for now.  Noeli Lavery KPA-C 11/17/2013 8:06 AM

## 2013-11-17 NOTE — Assessment & Plan Note (Signed)
EF 20-25% by echo pre op.

## 2013-11-20 ENCOUNTER — Other Ambulatory Visit: Payer: Self-pay | Admitting: Thoracic Surgery (Cardiothoracic Vascular Surgery)

## 2013-11-20 ENCOUNTER — Non-Acute Institutional Stay (SKILLED_NURSING_FACILITY): Payer: Medicare Other | Admitting: Adult Health

## 2013-11-20 ENCOUNTER — Encounter: Payer: Self-pay | Admitting: Adult Health

## 2013-11-20 DIAGNOSIS — I209 Angina pectoris, unspecified: Secondary | ICD-10-CM | POA: Diagnosis not present

## 2013-11-20 DIAGNOSIS — I2510 Atherosclerotic heart disease of native coronary artery without angina pectoris: Secondary | ICD-10-CM | POA: Diagnosis not present

## 2013-11-20 DIAGNOSIS — N3281 Overactive bladder: Secondary | ICD-10-CM

## 2013-11-20 DIAGNOSIS — K219 Gastro-esophageal reflux disease without esophagitis: Secondary | ICD-10-CM

## 2013-11-20 DIAGNOSIS — N318 Other neuromuscular dysfunction of bladder: Secondary | ICD-10-CM

## 2013-11-20 DIAGNOSIS — I25119 Atherosclerotic heart disease of native coronary artery with unspecified angina pectoris: Secondary | ICD-10-CM

## 2013-11-20 DIAGNOSIS — E039 Hypothyroidism, unspecified: Secondary | ICD-10-CM

## 2013-11-20 DIAGNOSIS — E785 Hyperlipidemia, unspecified: Secondary | ICD-10-CM

## 2013-11-20 DIAGNOSIS — I2589 Other forms of chronic ischemic heart disease: Secondary | ICD-10-CM

## 2013-11-20 DIAGNOSIS — Z951 Presence of aortocoronary bypass graft: Secondary | ICD-10-CM | POA: Diagnosis not present

## 2013-11-20 DIAGNOSIS — E871 Hypo-osmolality and hyponatremia: Secondary | ICD-10-CM

## 2013-11-20 DIAGNOSIS — I472 Ventricular tachycardia: Secondary | ICD-10-CM | POA: Diagnosis not present

## 2013-11-20 DIAGNOSIS — I5022 Chronic systolic (congestive) heart failure: Secondary | ICD-10-CM

## 2013-11-20 DIAGNOSIS — I4729 Other ventricular tachycardia: Secondary | ICD-10-CM

## 2013-11-20 DIAGNOSIS — E119 Type 2 diabetes mellitus without complications: Secondary | ICD-10-CM

## 2013-11-20 NOTE — Progress Notes (Signed)
Patient ID: Marissa Jefferson, female   DOB: [birth year], 80 y.o.   MRN: [id number]             PROGRESS NOTE  DATE:      11/20/13  FACILITY: Nursing Home Location: [facility]  LEVEL OF CARE: SNF (31)  Routine Visit  CHIEF COMPLAINT:  Manage CAD S/P CABG, NSVT, Hypothyroidism and DM  HISTORY OF PRESENT ILLNESS:   REASSESSMENT OF ONGOING PROBLEM(S):  HTN: Pt 's HTN remains stable.  Denies CP, sob, DOE, pedal edema, headaches, dizziness or visual disturbances.  No complications from the medications currently being used.  Last BP : 120/62  GERD: pt's GERD is stable.  Denies ongoing heartburn, abd. Pain, nausea or vomiting.  Currently on a PPI & tolerates it without any adverse reactions.  DM:pt's DM remains stable.  Pt denies polyuria, polydipsia, polyphagia, changes in vision or hypoglycemic episodes.  No complications noted from the medication presently being used.   7/15 hemoglobin A1c is: 6.8  PAST MEDICAL HISTORY : Reviewed.  No changes/see problem list  CURRENT MEDICATIONS: Reviewed per MAR/see medication list  REVIEW OF SYSTEMS:  GENERAL:  no fatigue, no fever, chills or weakness, poor appetite RESPIRATORY: no cough, SOB, DOE, wheezing, hemoptysis CARDIAC: no chest pain, or palpitations GI: no abdominal pain, diarrhea, constipation, heart burn, nausea or vomiting  PHYSICAL EXAMINATION  GENERAL: no acute distress, normal body habitus NECK: supple, trachea midline, no neck masses, no thyroid tenderness, no thyromegaly LYMPHATICS: no LAN in the neck, no supraclavicular LAN RESPIRATORY: breathing is even & unlabored, BS CTAB CARDIAC: RRR, no murmur,no extra heart sounds, BLE edema 1+, chest midline surgical incision is dry, healed GI: abdomen soft, normal BS, no masses, no tenderness, no hepatomegaly, no splenomegaly EXTREMITIES: able to move all 4 extremities PSYCHIATRIC: the patient is alert & oriented to person, affect & behavior  appropriate  LABS/RADIOLOGY: 10/28/13  sodium 131 potassium 3.6 glucose 103 BUN 22 creatinine 0.9 calcium 8.9 WBC 8.9 hemoglobin 9.4 hematocrit 29.6 MCV 98.7 Labs reviewed: Basic Metabolic Panel:  Recent Labs  10/10/13 0350 10/10/13 1629  10/16/13 0312 10/18/13 0400 10/19/13 0517 10/19/13 0547  NA 138  --   < > 132* 129* 135*  --   K 3.9  --   < > 5.1 4.8 4.2  --   CL 104  --   < > 95* 89* 93*  --   CO2 24  --   < > 26 27 30   --   GLUCOSE 130*  --   < > 123* 118* 110*  --   BUN 16  --   < > 21 26* 17  --   CREATININE 0.59 0.75  < > 0.69 0.85 0.65  --   CALCIUM 8.6  --   < > 8.8 9.0 8.8  --   MG 2.0 1.8  --   --   --   --  1.0*  < > = values in this interval not displayed. Liver Function Tests:  Recent Labs  10/07/13 1935 10/13/13 0350  AST 15 36  ALT 10 55*  ALKPHOS 50 52  BILITOT 0.4 0.6  PROT 6.8 5.1*  ALBUMIN 3.9 2.6*   CBC:  Recent Labs  10/14/13 0419 10/15/13 0303 10/16/13 0312  WBC 10.4 9.9 10.2  HGB 8.9* 8.8* 9.4*  HCT 27.0* 27.1* 28.2*  MCV 97.5 97.8 97.2  PLT 257 268 307   Lipid Panel:  Recent Labs  10/07/13 0350  HDL 40  CBG:  Recent Labs  10/20/13 1642 10/20/13 2122 10/21/13 0625  GLUCAP 131* 114* 138*   EXAM: CT CHEST WITHOUT CONTRAST   TECHNIQUE: Multidetector CT imaging of the chest was performed following the standard protocol without IV contrast.   COMPARISON:  None.   FINDINGS: No pathologically enlarged mediastinal or axillary lymph nodes. Hilar regions are difficult to definitively evaluate without IV contrast. Aorta is moderately calcified. Extensive 3 vessel coronary artery calcification. Heart is mildly enlarged. No pericardial effusion. Small hiatal hernia. Favor a dilated left inferior pulmonary vein (series 2, image 32), rather than an enlarged lymph node.   Tiny right pleural effusion. Mild biapical pleural parenchymal scarring with subpleural extension inferiorly. Triangular-shaped nodule in the right  lower lobe, adjacent to the right major fissure measures 5 mm (4 x 5 mm). Airway is unremarkable.   Incidental imaging of the upper abdomen shows the visualized portions of the liver, adrenal glands, kidneys, spleen and stomach to be otherwise grossly unremarkable. No worrisome lytic or sclerotic lesions. Degenerative changes are seen in the spine.   IMPRESSION: 1. Atherosclerotic calcification of the arterial vasculature, including extensive three-vessel involvement of the coronary arteries. 2. Tiny right pleural effusion. 3. 5 mm subpleural nodule along the right major fissure may represent a subpleural lymph node. If the patient is at high risk for bronchogenic carcinoma, follow-up chest CT at 6-12 months is recommended. If the patient is at low risk for bronchogenic carcinoma, follow-up chest CT at 12 months is recommended. This recommendation follows the consensus statement: Guidelines for Management of Small Pulmonary Nodules Detected on CT Scans: A Statement from the [place] as published in Radiology 2005;237:395-400. EXAM: CHEST  2 VIEW   COMPARISON:  10/15/2013   FINDINGS: Sequelae of CABG are again identified. Cardiomediastinal silhouette is unchanged with thoracic aortic calcification noted. Small bilateral pleural effusions do not appear significantly changed. The lungs are well inflated without evidence of confluent airspace opacity, pulmonary edema, or pneumothorax. Surgical clips are noted in the left axilla.   IMPRESSION: Unchanged, small bilateral pleural effusions.   ASSESSMENT/PLAN:  Multivessel CAD S/P CABG X4 - stable; continue rehabilitation NSVT (Nonsustained ventricular tachycardia) - rate-controlled; continue Amiodarone Hyperlipidemia - continue atorvastatin Chronic systolic heart failure - stable; recently discontinued Lasix Hypothyroidism - continue Synthroid Overactive bladder - continue oxybutynin GERD - stable; continue  Protonix Diabetes mellitus, type II - well controlled; continue Glucophage Poor appetite - start Eldertonic 15 ml PO BID Hyponatremia - check bmp  CPT CODE: [id number]  Nashalie Sallis Vargas - NP [facility] [phone]

## 2013-11-24 ENCOUNTER — Ambulatory Visit (INDEPENDENT_AMBULATORY_CARE_PROVIDER_SITE_OTHER): Payer: Self-pay | Admitting: Thoracic Surgery (Cardiothoracic Vascular Surgery)

## 2013-11-24 ENCOUNTER — Ambulatory Visit
Admission: RE | Admit: 2013-11-24 | Discharge: 2013-11-24 | Disposition: A | Discharge status: Home / Self Care | Payer: Medicare Other | Source: Ambulatory Visit | Attending: Thoracic Surgery (Cardiothoracic Vascular Surgery) | Admitting: Thoracic Surgery (Cardiothoracic Vascular Surgery)

## 2013-11-24 ENCOUNTER — Encounter: Payer: Self-pay | Admitting: Thoracic Surgery (Cardiothoracic Vascular Surgery)

## 2013-11-24 VITALS — BP 104/69 | HR 60 | Ht 66.0 in | Wt 148.0 lb

## 2013-11-24 DIAGNOSIS — R0602 Shortness of breath: Secondary | ICD-10-CM | POA: Diagnosis not present

## 2013-11-24 DIAGNOSIS — I2589 Other forms of chronic ischemic heart disease: Secondary | ICD-10-CM

## 2013-11-24 DIAGNOSIS — Z951 Presence of aortocoronary bypass graft: Secondary | ICD-10-CM

## 2013-11-24 DIAGNOSIS — Z9889 Other specified postprocedural states: Secondary | ICD-10-CM | POA: Diagnosis not present

## 2013-11-24 DIAGNOSIS — J90 Pleural effusion, not elsewhere classified: Secondary | ICD-10-CM | POA: Diagnosis not present

## 2013-11-24 NOTE — Progress Notes (Signed)
HPI:  [name] returns for a scheduled postoperative followup visit.  She is an 80 year old woman who presented with class III congestive heart failure. Workup revealed severe three-vessel disease and ischemic cardiomyopathy with ejection fraction of 20-25%. She underwent coronary bypass grafting x4 on July 31. Postoperatively she had some atrial fibrillation which was treated with amiodarone. She was discharged on August 12 to [facility] where she has been having rehabilitation.  She says that she's not had any pain at all since the surgery. She is not using tramadol. She does take a couple of Tylenol occasionally. She has been working with physical therapy and making progress. She is anxious to return home. Denies shortness of breath.  Past Medical History  Diagnosis Date  . DM2 (diabetes mellitus, type 2)   . Hypertension   . Hyperlipidemia   . GERD (gastroesophageal reflux disease)   . Osteopenia   . CAD (coronary artery disease), native coronary artery 10/06/2013    LAD 90%, CFX 90%, RCA 100% w/ collat  . Ischemic dilated cardiomyopathy 09/2013    EF 20-25% by echo  . Chronic combined systolic and diastolic CHF, NYHA class 2 09/2013  . NSVT (nonsustained ventricular tachycardia) 09/2013  . OAB (overactive bladder)   . Hypothyroidism   . CVA (cerebral vascular accident) 1968    leaving no deficit  . Arthritis     "knees" (10/08/2013)  . Breast cancer     "left; I had 62 sessions of radiation"      Current Outpatient Prescriptions  Medication Sig Dispense Refill  . acetaminophen (TYLENOL) 325 MG tablet Take 650 mg by mouth every 6 (six) hours as needed.      Marland Kitchen amiodarone (PACERONE) 200 MG tablet Take 1 tablet (200 mg total) by mouth daily.      Marland Kitchen aspirin EC 325 MG EC tablet Take 1 tablet (325 mg total) by mouth daily.  30 tablet  0  . atorvastatin (LIPITOR) 80 MG tablet Take 1 tablet by mouth daily.      . carvedilol (COREG) 6.25 MG tablet Take 1 tablet (6.25 mg total)  by mouth 2 (two) times daily with a meal.      . cholecalciferol (VITAMIN D) 1000 UNITS tablet Take 1,000 Units by mouth daily.      . feeding supplement, GLUCERNA SHAKE, (GLUCERNA SHAKE) LIQD Take 237 mLs by mouth 2 (two) times daily between meals.    0  . ferrous sulfate 325 (65 FE) MG tablet Take 1 tablet (325 mg total) by mouth daily with breakfast.    3  . furosemide (LASIX) 40 MG tablet Take 1 tablet (40 mg total) by mouth daily.  30 tablet    . levothyroxine (SYNTHROID, LEVOTHROID) 112 MCG tablet Take 1 tablet by mouth daily.      Marland Kitchen lisinopril-hydrochlorothiazide (PRINZIDE,ZESTORETIC) 20-12.5 MG per tablet Take 2 tablets by mouth daily.      . magnesium oxide (MAG-OX) 400 (241.3 MG) MG tablet Take 1 tablet (400 mg total) by mouth 2 (two) times daily.  14 tablet  0  . metFORMIN (GLUCOPHAGE) 1000 MG tablet Take 1 tablet by mouth 2 (two) times daily.      Marland Kitchen oxybutynin (DITROPAN XL) 15 MG 24 hr tablet Take 2 tablets by mouth daily.      . pantoprazole (PROTONIX) 40 MG tablet Take 1 tablet by mouth daily.      . VOLTAREN 1 % GEL Apply 1 application topically daily.       No  current facility-administered medications for this visit.    Physical Exam BP 104/69  Pulse 60  Ht 5\' 6"  (1.676 m)  Wt 148 lb (67.132 kg)  BMI 23.90 kg/m2  SpO51 80% 80 year old woman in no acute distress Alert and oriented x3 with no focal deficits Lungs slightly diminished at left base otherwise clear Cardiac regular rate and rhythm normal S1 and S2 Sternum stable, incision healing well The incision healing well No peripheral edema  Diagnostic Tests: CHEST 2 VIEW  COMPARISON: 10/16/2013  FINDINGS:  No focal consolidation. Mild chronic interstitial thickening.  Bilateral trace pleural effusions. No pneumothorax. Stable  cardiomediastinal silhouette. Prior CABG. Surgical clips in the  right left axilla. Unremarkable osseous structures.  IMPRESSION:  Trace bilateral pleural effusions. No overt congestive  failure.  Electronically Signed  By: Kathreen Devoid  On: 11/24/2013 10:19   Impression: 80 year old woman who underwent coronary bypass grafting x4 about 6 weeks ago. She is doing well at this time. She has minimal discomfort. Her exercise tolerance is gradually improving. She has been a [facility] and working with PT and OT care. She is anxious to return home. At this time I think she could go back home but needs to continue PT and OT as an outpatient.  She had questions about using her arms and sleeping on her side. She is now 6 weeks out from surgery, at this point I don't think either of those things are an issue. I did tell her to use her arms only to the extent necessary to help her up enough to support all of her weight on her arms.  Plan:  I will plan to see her back in one month to check on her progress. We will repeat her chest x-ray at that time

## 2013-11-25 ENCOUNTER — Non-Acute Institutional Stay (SKILLED_NURSING_FACILITY): Payer: Medicare Other | Admitting: Adult Health

## 2013-11-25 ENCOUNTER — Encounter: Payer: Self-pay | Admitting: Adult Health

## 2013-11-25 DIAGNOSIS — I25119 Atherosclerotic heart disease of native coronary artery with unspecified angina pectoris: Secondary | ICD-10-CM

## 2013-11-25 DIAGNOSIS — I2510 Atherosclerotic heart disease of native coronary artery without angina pectoris: Secondary | ICD-10-CM | POA: Diagnosis not present

## 2013-11-25 DIAGNOSIS — E119 Type 2 diabetes mellitus without complications: Secondary | ICD-10-CM

## 2013-11-25 DIAGNOSIS — I209 Angina pectoris, unspecified: Secondary | ICD-10-CM

## 2013-11-25 DIAGNOSIS — I4729 Other ventricular tachycardia: Secondary | ICD-10-CM

## 2013-11-25 DIAGNOSIS — K219 Gastro-esophageal reflux disease without esophagitis: Secondary | ICD-10-CM

## 2013-11-25 DIAGNOSIS — Z951 Presence of aortocoronary bypass graft: Secondary | ICD-10-CM

## 2013-11-25 DIAGNOSIS — I10 Essential (primary) hypertension: Secondary | ICD-10-CM

## 2013-11-25 DIAGNOSIS — I5022 Chronic systolic (congestive) heart failure: Secondary | ICD-10-CM

## 2013-11-25 DIAGNOSIS — E785 Hyperlipidemia, unspecified: Secondary | ICD-10-CM

## 2013-11-25 DIAGNOSIS — N3281 Overactive bladder: Secondary | ICD-10-CM

## 2013-11-25 DIAGNOSIS — N318 Other neuromuscular dysfunction of bladder: Secondary | ICD-10-CM

## 2013-11-25 DIAGNOSIS — I472 Ventricular tachycardia: Secondary | ICD-10-CM

## 2013-11-25 DIAGNOSIS — E039 Hypothyroidism, unspecified: Secondary | ICD-10-CM

## 2013-11-25 DIAGNOSIS — D62 Acute posthemorrhagic anemia: Secondary | ICD-10-CM

## 2013-11-25 DIAGNOSIS — E871 Hypo-osmolality and hyponatremia: Secondary | ICD-10-CM

## 2013-11-25 NOTE — Progress Notes (Signed)
Patient ID: Marissa Jefferson, female   DOB: 1924-08-12, 80 y.o.   MRN: [id number]             PROGRESS NOTE  DATE:      11/25/13  FACILITY: Nursing Home Location: [facility]  LEVEL OF CARE: SNF (31)  Acute Visit  CHIEF COMPLAINT:  Discharge Notes  HISTORY OF PRESENT ILLNESS:  This is an 80 year old female who is for discharge home with health PT, OT, nursing and home health aide.  She has been admitted to [facility] on 10/21/13 from [facility] with Multivessel CAD S/P CABG X4. Patient was admitted to this facility for short-term rehabilitation after the patient's recent hospitalization.  Patient has completed SNF rehabilitation and therapy has cleared the patient for discharge.  REASSESSMENT OF ONGOING PROBLEM(S):  HTN: Pt 's HTN remains stable.  Denies CP, sob, DOE, pedal edema, headaches, dizziness or visual disturbances.  No complications from the medications currently being used.  Last BP : 114/59  CHF:The patient does not relate significant weight changes, denies sob, DOE, orthopnea, PNDs, pedal edema, palpitations or chest pain.  CHF remains stable.  No complications form the medications being used.  ANEMIA: The anemia has been stable. The patient denies fatigue, melena or hematochezia. No complications from the medications currently being used.  8/15 hgb 9.4  PAST MEDICAL HISTORY : Reviewed.  No changes/see problem list  CURRENT MEDICATIONS: Reviewed per MAR/see medication list  REVIEW OF SYSTEMS:  GENERAL:  no fatigue, no fever, chills or weakness RESPIRATORY: no cough, SOB, DOE, wheezing, hemoptysis CARDIAC: no chest pain, or palpitations GI: no abdominal pain, diarrhea, constipation, heart burn, nausea or vomiting  PHYSICAL EXAMINATION  GENERAL: no acute distress, normal body habitus NECK: supple, trachea midline, no neck masses, no thyroid tenderness, no thyromegaly RESPIRATORY: breathing is even & unlabored, BS CTAB CARDIAC: RRR, no  murmur,no extra heart sounds, BLE edema 1+, chest midline surgical incision is dry, healed GI: abdomen soft, normal BS, no masses, no tenderness, no hepatomegaly, no splenomegaly EXTREMITIES: able to move all 4 extremities PSYCHIATRIC: the patient is alert & oriented to person, affect & behavior appropriate  LABS/RADIOLOGY: 11/23/13   Sodium 137 potassium 4.2 glucose 88 BUN 16 creatinine 0.8 calcium 10.2 10/28/13  sodium 131 potassium 3.6 glucose 103 BUN 22 creatinine 0.9 calcium 8.9 WBC 8.9 hemoglobin 9.4 hematocrit 29.6 MCV 98.7 Labs reviewed: Basic Metabolic Panel:  Recent Labs  10/10/13 0350 10/10/13 1629  10/16/13 0312 10/18/13 0400 10/19/13 0517 10/19/13 0547  NA 138  --   < > 132* 129* 135*  --   K 3.9  --   < > 5.1 4.8 4.2  --   CL 104  --   < > 95* 89* 93*  --   CO2 24  --   < > 26 27 30   --   GLUCOSE 130*  --   < > 123* 118* 110*  --   BUN 16  --   < > 21 26* 17  --   CREATININE 0.59 0.75  < > 0.69 0.85 0.65  --   CALCIUM 8.6  --   < > 8.8 9.0 8.8  --   MG 2.0 1.8  --   --   --   --  1.0*  < > = values in this interval not displayed. Liver Function Tests:  Recent Labs  10/07/13 1935 10/13/13 0350  AST 15 36  ALT 10 55*  ALKPHOS 50 52  BILITOT 0.4 0.6  PROT 6.8 5.1*  ALBUMIN 3.9 2.6*   CBC:  Recent Labs  10/14/13 0419 10/15/13 0303 10/16/13 0312  WBC 10.4 9.9 10.2  HGB 8.9* 8.8* 9.4*  HCT 27.0* 27.1* 28.2*  MCV 97.5 97.8 97.2  PLT 257 268 307   Lipid Panel:  Recent Labs  10/07/13 0350  HDL 40   CBG:  Recent Labs  10/20/13 1642 10/20/13 2122 10/21/13 0625  GLUCAP 131* 114* 138*   EXAM: CT CHEST WITHOUT CONTRAST   TECHNIQUE: Multidetector CT imaging of the chest was performed following the standard protocol without IV contrast.   COMPARISON:  None.   FINDINGS: No pathologically enlarged mediastinal or axillary lymph nodes. Hilar regions are difficult to definitively evaluate without IV contrast. Aorta is moderately calcified.  Extensive 3 vessel coronary artery calcification. Heart is mildly enlarged. No pericardial effusion. Small hiatal hernia. Favor a dilated left inferior pulmonary vein (series 2, image 32), rather than an enlarged lymph node.   Tiny right pleural effusion. Mild biapical pleural parenchymal scarring with subpleural extension inferiorly. Triangular-shaped nodule in the right lower lobe, adjacent to the right major fissure measures 5 mm (4 x 5 mm). Airway is unremarkable.   Incidental imaging of the upper abdomen shows the visualized portions of the liver, adrenal glands, kidneys, spleen and stomach to be otherwise grossly unremarkable. No worrisome lytic or sclerotic lesions. Degenerative changes are seen in the spine.   IMPRESSION: 1. Atherosclerotic calcification of the arterial vasculature, including extensive three-vessel involvement of the coronary arteries. 2. Tiny right pleural effusion. 3. 5 mm subpleural nodule along the right major fissure may represent a subpleural lymph node. If the patient is at high risk for bronchogenic carcinoma, follow-up chest CT at 6-12 months is recommended. If the patient is at low risk for bronchogenic carcinoma, follow-up chest CT at 12 months is recommended. This recommendation follows the consensus statement: Guidelines for Management of Small Pulmonary Nodules Detected on CT Scans: A Statement from the [place] as published in Radiology 2005;237:395-400. EXAM: CHEST  2 VIEW   COMPARISON:  10/15/2013   FINDINGS: Sequelae of CABG are again identified. Cardiomediastinal silhouette is unchanged with thoracic aortic calcification noted. Small bilateral pleural effusions do not appear significantly changed. The lungs are well inflated without evidence of confluent airspace opacity, pulmonary edema, or pneumothorax. Surgical clips are noted in the left axilla.   IMPRESSION: Unchanged, small bilateral pleural  effusions.   ASSESSMENT/PLAN:  Multivessel CAD S/P CABG X4 - stable; for home health PT, OT, nursing and home health aide NSVT (Nonsustained ventricular tachycardia) - rate-controlled; continue Amiodarone Hyperlipidemia - continue atorvastatin Chronic systolic heart failure - stable; recently discontinued Lasix Hypothyroidism - continue Synthroid Overactive bladder - continue oxybutynin GERD - stable; continue Protonix Diabetes mellitus, type II - well controlled; continue Glucophage Poor appetite - recently started on Eldertonic Hyponatremia - recently discontinued Lasix, check bmp Anemia, acute blood loss - continue ferrous sulfate   I have filled out patient's discharge paperwork and written prescriptions.  Patient will receive home health PT, OT, Nursing and home health aide.   Total discharge time: Greater than 30 minutes  Discharge time involved coordination of the discharge process with social worker, nursing staff and therapy department. Medical justification for home health services verified.   CPT CODE: 60630  Seth Bake - NP [facility] [phone]

## 2013-11-29 DIAGNOSIS — I2510 Atherosclerotic heart disease of native coronary artery without angina pectoris: Secondary | ICD-10-CM | POA: Diagnosis not present

## 2013-11-29 DIAGNOSIS — I5022 Chronic systolic (congestive) heart failure: Secondary | ICD-10-CM | POA: Diagnosis not present

## 2013-11-29 DIAGNOSIS — Z48812 Encounter for surgical aftercare following surgery on the circulatory system: Secondary | ICD-10-CM | POA: Diagnosis not present

## 2013-11-29 DIAGNOSIS — E785 Hyperlipidemia, unspecified: Secondary | ICD-10-CM | POA: Diagnosis not present

## 2013-11-29 DIAGNOSIS — I10 Essential (primary) hypertension: Secondary | ICD-10-CM | POA: Diagnosis not present

## 2013-11-29 DIAGNOSIS — E119 Type 2 diabetes mellitus without complications: Secondary | ICD-10-CM | POA: Diagnosis not present

## 2013-12-01 DIAGNOSIS — Z48812 Encounter for surgical aftercare following surgery on the circulatory system: Secondary | ICD-10-CM | POA: Diagnosis not present

## 2013-12-01 DIAGNOSIS — I10 Essential (primary) hypertension: Secondary | ICD-10-CM | POA: Diagnosis not present

## 2013-12-01 DIAGNOSIS — E785 Hyperlipidemia, unspecified: Secondary | ICD-10-CM | POA: Diagnosis not present

## 2013-12-01 DIAGNOSIS — I2510 Atherosclerotic heart disease of native coronary artery without angina pectoris: Secondary | ICD-10-CM | POA: Diagnosis not present

## 2013-12-01 DIAGNOSIS — I5022 Chronic systolic (congestive) heart failure: Secondary | ICD-10-CM | POA: Diagnosis not present

## 2013-12-01 DIAGNOSIS — E119 Type 2 diabetes mellitus without complications: Secondary | ICD-10-CM | POA: Diagnosis not present

## 2013-12-02 ENCOUNTER — Telehealth: Payer: Self-pay | Admitting: Cardiovascular Disease

## 2013-12-02 DIAGNOSIS — I10 Essential (primary) hypertension: Secondary | ICD-10-CM | POA: Diagnosis not present

## 2013-12-02 DIAGNOSIS — E119 Type 2 diabetes mellitus without complications: Secondary | ICD-10-CM | POA: Diagnosis not present

## 2013-12-02 DIAGNOSIS — I5022 Chronic systolic (congestive) heart failure: Secondary | ICD-10-CM | POA: Diagnosis not present

## 2013-12-02 DIAGNOSIS — E785 Hyperlipidemia, unspecified: Secondary | ICD-10-CM | POA: Diagnosis not present

## 2013-12-02 DIAGNOSIS — I2510 Atherosclerotic heart disease of native coronary artery without angina pectoris: Secondary | ICD-10-CM | POA: Diagnosis not present

## 2013-12-02 DIAGNOSIS — Z48812 Encounter for surgical aftercare following surgery on the circulatory system: Secondary | ICD-10-CM | POA: Diagnosis not present

## 2013-12-02 NOTE — Telephone Encounter (Signed)
Received a call from debra at [facility], the patients primary care will not sign her orders because they have not seen the patient since she had CHF. They would like to know if dr c will sign those orders. Will forward to dr c

## 2013-12-02 NOTE — Telephone Encounter (Signed)
Will gladly sign them

## 2013-12-03 DIAGNOSIS — I5022 Chronic systolic (congestive) heart failure: Secondary | ICD-10-CM | POA: Diagnosis not present

## 2013-12-03 DIAGNOSIS — Z48812 Encounter for surgical aftercare following surgery on the circulatory system: Secondary | ICD-10-CM | POA: Diagnosis not present

## 2013-12-03 DIAGNOSIS — I10 Essential (primary) hypertension: Secondary | ICD-10-CM | POA: Diagnosis not present

## 2013-12-03 DIAGNOSIS — I2510 Atherosclerotic heart disease of native coronary artery without angina pectoris: Secondary | ICD-10-CM | POA: Diagnosis not present

## 2013-12-03 DIAGNOSIS — E785 Hyperlipidemia, unspecified: Secondary | ICD-10-CM | POA: Diagnosis not present

## 2013-12-03 DIAGNOSIS — E119 Type 2 diabetes mellitus without complications: Secondary | ICD-10-CM | POA: Diagnosis not present

## 2013-12-03 NOTE — Telephone Encounter (Signed)
Spoke with deborah, aware we will sign orders.

## 2013-12-04 DIAGNOSIS — E119 Type 2 diabetes mellitus without complications: Secondary | ICD-10-CM | POA: Diagnosis not present

## 2013-12-04 DIAGNOSIS — I2510 Atherosclerotic heart disease of native coronary artery without angina pectoris: Secondary | ICD-10-CM | POA: Diagnosis not present

## 2013-12-04 DIAGNOSIS — E785 Hyperlipidemia, unspecified: Secondary | ICD-10-CM | POA: Diagnosis not present

## 2013-12-04 DIAGNOSIS — I5022 Chronic systolic (congestive) heart failure: Secondary | ICD-10-CM | POA: Diagnosis not present

## 2013-12-04 DIAGNOSIS — I10 Essential (primary) hypertension: Secondary | ICD-10-CM | POA: Diagnosis not present

## 2013-12-04 DIAGNOSIS — Z48812 Encounter for surgical aftercare following surgery on the circulatory system: Secondary | ICD-10-CM | POA: Diagnosis not present

## 2013-12-07 DIAGNOSIS — I5022 Chronic systolic (congestive) heart failure: Secondary | ICD-10-CM | POA: Diagnosis not present

## 2013-12-07 DIAGNOSIS — Z48812 Encounter for surgical aftercare following surgery on the circulatory system: Secondary | ICD-10-CM | POA: Diagnosis not present

## 2013-12-07 DIAGNOSIS — I2510 Atherosclerotic heart disease of native coronary artery without angina pectoris: Secondary | ICD-10-CM | POA: Diagnosis not present

## 2013-12-07 DIAGNOSIS — E119 Type 2 diabetes mellitus without complications: Secondary | ICD-10-CM | POA: Diagnosis not present

## 2013-12-07 DIAGNOSIS — E785 Hyperlipidemia, unspecified: Secondary | ICD-10-CM | POA: Diagnosis not present

## 2013-12-07 DIAGNOSIS — I10 Essential (primary) hypertension: Secondary | ICD-10-CM | POA: Diagnosis not present

## 2013-12-08 ENCOUNTER — Telehealth: Payer: Self-pay | Admitting: Cardiovascular Disease

## 2013-12-08 DIAGNOSIS — I5022 Chronic systolic (congestive) heart failure: Secondary | ICD-10-CM | POA: Diagnosis not present

## 2013-12-08 DIAGNOSIS — E119 Type 2 diabetes mellitus without complications: Secondary | ICD-10-CM | POA: Diagnosis not present

## 2013-12-08 DIAGNOSIS — E785 Hyperlipidemia, unspecified: Secondary | ICD-10-CM | POA: Diagnosis not present

## 2013-12-08 DIAGNOSIS — Z48812 Encounter for surgical aftercare following surgery on the circulatory system: Secondary | ICD-10-CM | POA: Diagnosis not present

## 2013-12-08 DIAGNOSIS — I2510 Atherosclerotic heart disease of native coronary artery without angina pectoris: Secondary | ICD-10-CM | POA: Diagnosis not present

## 2013-12-08 DIAGNOSIS — I10 Essential (primary) hypertension: Secondary | ICD-10-CM | POA: Diagnosis not present

## 2013-12-08 NOTE — Telephone Encounter (Signed)
Marissa Jefferson called needing an order for PT for Balance ,Strenghtening,and mobility

## 2013-12-08 NOTE — Telephone Encounter (Signed)
Please confirm verbal order

## 2013-12-08 NOTE — Telephone Encounter (Signed)
Verbal order given for PT for balance to [place] with [facility].  Voiced understanding.

## 2013-12-08 NOTE — Telephone Encounter (Signed)
Marissa Jefferson call left message for her to call me back

## 2013-12-08 NOTE — Telephone Encounter (Signed)
She would like to see her for home health for PT. This is to work on balance,strenghtening and mobility. Her frequency is 2 weeks 1,than 3 weeks 3,than 2 weeks 3.Please call her with a verbal order.

## 2013-12-09 DIAGNOSIS — Z48812 Encounter for surgical aftercare following surgery on the circulatory system: Secondary | ICD-10-CM | POA: Diagnosis not present

## 2013-12-09 DIAGNOSIS — E119 Type 2 diabetes mellitus without complications: Secondary | ICD-10-CM | POA: Diagnosis not present

## 2013-12-09 DIAGNOSIS — I10 Essential (primary) hypertension: Secondary | ICD-10-CM | POA: Diagnosis not present

## 2013-12-09 DIAGNOSIS — I5022 Chronic systolic (congestive) heart failure: Secondary | ICD-10-CM | POA: Diagnosis not present

## 2013-12-09 DIAGNOSIS — I2510 Atherosclerotic heart disease of native coronary artery without angina pectoris: Secondary | ICD-10-CM | POA: Diagnosis not present

## 2013-12-09 DIAGNOSIS — E785 Hyperlipidemia, unspecified: Secondary | ICD-10-CM | POA: Diagnosis not present

## 2013-12-10 DIAGNOSIS — I5022 Chronic systolic (congestive) heart failure: Secondary | ICD-10-CM | POA: Diagnosis not present

## 2013-12-10 DIAGNOSIS — E119 Type 2 diabetes mellitus without complications: Secondary | ICD-10-CM | POA: Diagnosis not present

## 2013-12-10 DIAGNOSIS — Z48812 Encounter for surgical aftercare following surgery on the circulatory system: Secondary | ICD-10-CM | POA: Diagnosis not present

## 2013-12-10 DIAGNOSIS — E785 Hyperlipidemia, unspecified: Secondary | ICD-10-CM | POA: Diagnosis not present

## 2013-12-10 DIAGNOSIS — I2510 Atherosclerotic heart disease of native coronary artery without angina pectoris: Secondary | ICD-10-CM | POA: Diagnosis not present

## 2013-12-10 DIAGNOSIS — I10 Essential (primary) hypertension: Secondary | ICD-10-CM | POA: Diagnosis not present

## 2013-12-11 DIAGNOSIS — E785 Hyperlipidemia, unspecified: Secondary | ICD-10-CM | POA: Diagnosis not present

## 2013-12-11 DIAGNOSIS — I5022 Chronic systolic (congestive) heart failure: Secondary | ICD-10-CM | POA: Diagnosis not present

## 2013-12-11 DIAGNOSIS — E119 Type 2 diabetes mellitus without complications: Secondary | ICD-10-CM | POA: Diagnosis not present

## 2013-12-11 DIAGNOSIS — I10 Essential (primary) hypertension: Secondary | ICD-10-CM | POA: Diagnosis not present

## 2013-12-11 DIAGNOSIS — I2510 Atherosclerotic heart disease of native coronary artery without angina pectoris: Secondary | ICD-10-CM | POA: Diagnosis not present

## 2013-12-11 DIAGNOSIS — Z48812 Encounter for surgical aftercare following surgery on the circulatory system: Secondary | ICD-10-CM | POA: Diagnosis not present

## 2013-12-14 ENCOUNTER — Telehealth: Payer: Self-pay | Admitting: *Deleted

## 2013-12-14 DIAGNOSIS — I10 Essential (primary) hypertension: Secondary | ICD-10-CM | POA: Diagnosis not present

## 2013-12-14 DIAGNOSIS — Z48812 Encounter for surgical aftercare following surgery on the circulatory system: Secondary | ICD-10-CM | POA: Diagnosis not present

## 2013-12-14 DIAGNOSIS — I2510 Atherosclerotic heart disease of native coronary artery without angina pectoris: Secondary | ICD-10-CM | POA: Diagnosis not present

## 2013-12-14 DIAGNOSIS — E119 Type 2 diabetes mellitus without complications: Secondary | ICD-10-CM | POA: Diagnosis not present

## 2013-12-14 DIAGNOSIS — E785 Hyperlipidemia, unspecified: Secondary | ICD-10-CM | POA: Diagnosis not present

## 2013-12-14 DIAGNOSIS — I5022 Chronic systolic (congestive) heart failure: Secondary | ICD-10-CM | POA: Diagnosis not present

## 2013-12-14 NOTE — Telephone Encounter (Signed)
Signed orders for S/N faxed 12/09/2013.

## 2013-12-15 DIAGNOSIS — E119 Type 2 diabetes mellitus without complications: Secondary | ICD-10-CM | POA: Diagnosis not present

## 2013-12-15 DIAGNOSIS — I2510 Atherosclerotic heart disease of native coronary artery without angina pectoris: Secondary | ICD-10-CM | POA: Diagnosis not present

## 2013-12-15 DIAGNOSIS — Z48812 Encounter for surgical aftercare following surgery on the circulatory system: Secondary | ICD-10-CM | POA: Diagnosis not present

## 2013-12-15 DIAGNOSIS — I10 Essential (primary) hypertension: Secondary | ICD-10-CM | POA: Diagnosis not present

## 2013-12-15 DIAGNOSIS — E785 Hyperlipidemia, unspecified: Secondary | ICD-10-CM | POA: Diagnosis not present

## 2013-12-15 DIAGNOSIS — I5022 Chronic systolic (congestive) heart failure: Secondary | ICD-10-CM | POA: Diagnosis not present

## 2013-12-16 DIAGNOSIS — E785 Hyperlipidemia, unspecified: Secondary | ICD-10-CM | POA: Diagnosis not present

## 2013-12-16 DIAGNOSIS — I2510 Atherosclerotic heart disease of native coronary artery without angina pectoris: Secondary | ICD-10-CM | POA: Diagnosis not present

## 2013-12-16 DIAGNOSIS — I5022 Chronic systolic (congestive) heart failure: Secondary | ICD-10-CM | POA: Diagnosis not present

## 2013-12-16 DIAGNOSIS — Z48812 Encounter for surgical aftercare following surgery on the circulatory system: Secondary | ICD-10-CM | POA: Diagnosis not present

## 2013-12-16 DIAGNOSIS — I10 Essential (primary) hypertension: Secondary | ICD-10-CM | POA: Diagnosis not present

## 2013-12-16 DIAGNOSIS — E119 Type 2 diabetes mellitus without complications: Secondary | ICD-10-CM | POA: Diagnosis not present

## 2013-12-17 DIAGNOSIS — E119 Type 2 diabetes mellitus without complications: Secondary | ICD-10-CM | POA: Diagnosis not present

## 2013-12-17 DIAGNOSIS — I10 Essential (primary) hypertension: Secondary | ICD-10-CM | POA: Diagnosis not present

## 2013-12-17 DIAGNOSIS — I2510 Atherosclerotic heart disease of native coronary artery without angina pectoris: Secondary | ICD-10-CM | POA: Diagnosis not present

## 2013-12-17 DIAGNOSIS — Z48812 Encounter for surgical aftercare following surgery on the circulatory system: Secondary | ICD-10-CM | POA: Diagnosis not present

## 2013-12-17 DIAGNOSIS — I5022 Chronic systolic (congestive) heart failure: Secondary | ICD-10-CM | POA: Diagnosis not present

## 2013-12-17 DIAGNOSIS — E785 Hyperlipidemia, unspecified: Secondary | ICD-10-CM | POA: Diagnosis not present

## 2013-12-18 DIAGNOSIS — I2510 Atherosclerotic heart disease of native coronary artery without angina pectoris: Secondary | ICD-10-CM | POA: Diagnosis not present

## 2013-12-18 DIAGNOSIS — I5022 Chronic systolic (congestive) heart failure: Secondary | ICD-10-CM | POA: Diagnosis not present

## 2013-12-18 DIAGNOSIS — I10 Essential (primary) hypertension: Secondary | ICD-10-CM | POA: Diagnosis not present

## 2013-12-18 DIAGNOSIS — E119 Type 2 diabetes mellitus without complications: Secondary | ICD-10-CM | POA: Diagnosis not present

## 2013-12-18 DIAGNOSIS — E785 Hyperlipidemia, unspecified: Secondary | ICD-10-CM | POA: Diagnosis not present

## 2013-12-18 DIAGNOSIS — Z48812 Encounter for surgical aftercare following surgery on the circulatory system: Secondary | ICD-10-CM | POA: Diagnosis not present

## 2013-12-21 ENCOUNTER — Other Ambulatory Visit: Payer: Self-pay | Admitting: Thoracic Surgery (Cardiothoracic Vascular Surgery)

## 2013-12-21 ENCOUNTER — Other Ambulatory Visit: Payer: Self-pay | Admitting: Adult Health

## 2013-12-21 DIAGNOSIS — I10 Essential (primary) hypertension: Secondary | ICD-10-CM | POA: Diagnosis not present

## 2013-12-21 DIAGNOSIS — I5022 Chronic systolic (congestive) heart failure: Secondary | ICD-10-CM | POA: Diagnosis not present

## 2013-12-21 DIAGNOSIS — E119 Type 2 diabetes mellitus without complications: Secondary | ICD-10-CM | POA: Diagnosis not present

## 2013-12-21 DIAGNOSIS — I2510 Atherosclerotic heart disease of native coronary artery without angina pectoris: Secondary | ICD-10-CM | POA: Diagnosis not present

## 2013-12-21 DIAGNOSIS — Z48812 Encounter for surgical aftercare following surgery on the circulatory system: Secondary | ICD-10-CM | POA: Diagnosis not present

## 2013-12-21 DIAGNOSIS — E785 Hyperlipidemia, unspecified: Secondary | ICD-10-CM | POA: Diagnosis not present

## 2013-12-22 ENCOUNTER — Other Ambulatory Visit: Payer: Self-pay | Admitting: Cardiovascular Disease

## 2013-12-22 DIAGNOSIS — Z48812 Encounter for surgical aftercare following surgery on the circulatory system: Secondary | ICD-10-CM | POA: Diagnosis not present

## 2013-12-22 DIAGNOSIS — E119 Type 2 diabetes mellitus without complications: Secondary | ICD-10-CM | POA: Diagnosis not present

## 2013-12-22 DIAGNOSIS — I2510 Atherosclerotic heart disease of native coronary artery without angina pectoris: Secondary | ICD-10-CM | POA: Diagnosis not present

## 2013-12-22 DIAGNOSIS — E785 Hyperlipidemia, unspecified: Secondary | ICD-10-CM | POA: Diagnosis not present

## 2013-12-22 DIAGNOSIS — I5022 Chronic systolic (congestive) heart failure: Secondary | ICD-10-CM | POA: Diagnosis not present

## 2013-12-22 DIAGNOSIS — I10 Essential (primary) hypertension: Secondary | ICD-10-CM | POA: Diagnosis not present

## 2013-12-23 DIAGNOSIS — I2510 Atherosclerotic heart disease of native coronary artery without angina pectoris: Secondary | ICD-10-CM | POA: Diagnosis not present

## 2013-12-23 DIAGNOSIS — E119 Type 2 diabetes mellitus without complications: Secondary | ICD-10-CM | POA: Diagnosis not present

## 2013-12-23 DIAGNOSIS — E785 Hyperlipidemia, unspecified: Secondary | ICD-10-CM | POA: Diagnosis not present

## 2013-12-23 DIAGNOSIS — I10 Essential (primary) hypertension: Secondary | ICD-10-CM | POA: Diagnosis not present

## 2013-12-23 DIAGNOSIS — Z48812 Encounter for surgical aftercare following surgery on the circulatory system: Secondary | ICD-10-CM | POA: Diagnosis not present

## 2013-12-23 DIAGNOSIS — I5022 Chronic systolic (congestive) heart failure: Secondary | ICD-10-CM | POA: Diagnosis not present

## 2013-12-25 ENCOUNTER — Other Ambulatory Visit: Payer: Self-pay | Admitting: Adult Health

## 2013-12-25 DIAGNOSIS — E119 Type 2 diabetes mellitus without complications: Secondary | ICD-10-CM | POA: Diagnosis not present

## 2013-12-25 DIAGNOSIS — E785 Hyperlipidemia, unspecified: Secondary | ICD-10-CM | POA: Diagnosis not present

## 2013-12-25 DIAGNOSIS — I10 Essential (primary) hypertension: Secondary | ICD-10-CM | POA: Diagnosis not present

## 2013-12-25 DIAGNOSIS — Z48812 Encounter for surgical aftercare following surgery on the circulatory system: Secondary | ICD-10-CM | POA: Diagnosis not present

## 2013-12-25 DIAGNOSIS — I5022 Chronic systolic (congestive) heart failure: Secondary | ICD-10-CM | POA: Diagnosis not present

## 2013-12-25 DIAGNOSIS — I2510 Atherosclerotic heart disease of native coronary artery without angina pectoris: Secondary | ICD-10-CM | POA: Diagnosis not present

## 2013-12-29 ENCOUNTER — Ambulatory Visit: Payer: Medicare Other | Admitting: Thoracic Surgery (Cardiothoracic Vascular Surgery)

## 2013-12-29 DIAGNOSIS — E119 Type 2 diabetes mellitus without complications: Secondary | ICD-10-CM | POA: Diagnosis not present

## 2013-12-29 DIAGNOSIS — E785 Hyperlipidemia, unspecified: Secondary | ICD-10-CM | POA: Diagnosis not present

## 2013-12-29 DIAGNOSIS — I5022 Chronic systolic (congestive) heart failure: Secondary | ICD-10-CM | POA: Diagnosis not present

## 2013-12-29 DIAGNOSIS — I10 Essential (primary) hypertension: Secondary | ICD-10-CM | POA: Diagnosis not present

## 2013-12-29 DIAGNOSIS — Z48812 Encounter for surgical aftercare following surgery on the circulatory system: Secondary | ICD-10-CM | POA: Diagnosis not present

## 2013-12-29 DIAGNOSIS — I2510 Atherosclerotic heart disease of native coronary artery without angina pectoris: Secondary | ICD-10-CM | POA: Diagnosis not present

## 2014-01-01 ENCOUNTER — Other Ambulatory Visit: Payer: Self-pay | Admitting: Thoracic Surgery (Cardiothoracic Vascular Surgery)

## 2014-01-01 DIAGNOSIS — I2510 Atherosclerotic heart disease of native coronary artery without angina pectoris: Secondary | ICD-10-CM | POA: Diagnosis not present

## 2014-01-01 DIAGNOSIS — E119 Type 2 diabetes mellitus without complications: Secondary | ICD-10-CM | POA: Diagnosis not present

## 2014-01-01 DIAGNOSIS — Z48812 Encounter for surgical aftercare following surgery on the circulatory system: Secondary | ICD-10-CM | POA: Diagnosis not present

## 2014-01-01 DIAGNOSIS — I10 Essential (primary) hypertension: Secondary | ICD-10-CM | POA: Diagnosis not present

## 2014-01-01 DIAGNOSIS — I5022 Chronic systolic (congestive) heart failure: Secondary | ICD-10-CM | POA: Diagnosis not present

## 2014-01-01 DIAGNOSIS — E785 Hyperlipidemia, unspecified: Secondary | ICD-10-CM | POA: Diagnosis not present

## 2014-01-04 DIAGNOSIS — E119 Type 2 diabetes mellitus without complications: Secondary | ICD-10-CM | POA: Diagnosis not present

## 2014-01-04 DIAGNOSIS — Z48812 Encounter for surgical aftercare following surgery on the circulatory system: Secondary | ICD-10-CM | POA: Diagnosis not present

## 2014-01-04 DIAGNOSIS — I5022 Chronic systolic (congestive) heart failure: Secondary | ICD-10-CM | POA: Diagnosis not present

## 2014-01-04 DIAGNOSIS — I2510 Atherosclerotic heart disease of native coronary artery without angina pectoris: Secondary | ICD-10-CM | POA: Diagnosis not present

## 2014-01-04 DIAGNOSIS — E785 Hyperlipidemia, unspecified: Secondary | ICD-10-CM | POA: Diagnosis not present

## 2014-01-04 DIAGNOSIS — I10 Essential (primary) hypertension: Secondary | ICD-10-CM | POA: Diagnosis not present

## 2014-01-05 ENCOUNTER — Ambulatory Visit (INDEPENDENT_AMBULATORY_CARE_PROVIDER_SITE_OTHER): Payer: Self-pay | Admitting: Thoracic Surgery (Cardiothoracic Vascular Surgery)

## 2014-01-05 ENCOUNTER — Ambulatory Visit
Admission: RE | Admit: 2014-01-05 | Discharge: 2014-01-05 | Disposition: A | Discharge status: Home / Self Care | Payer: Medicare Other | Source: Ambulatory Visit | Attending: Thoracic Surgery (Cardiothoracic Vascular Surgery) | Admitting: Thoracic Surgery (Cardiothoracic Vascular Surgery)

## 2014-01-05 ENCOUNTER — Encounter: Payer: Self-pay | Admitting: Thoracic Surgery (Cardiothoracic Vascular Surgery)

## 2014-01-05 VITALS — BP 157/73 | HR 65 | Ht 66.0 in | Wt 147.0 lb

## 2014-01-05 DIAGNOSIS — Z951 Presence of aortocoronary bypass graft: Secondary | ICD-10-CM | POA: Diagnosis not present

## 2014-01-05 DIAGNOSIS — J90 Pleural effusion, not elsewhere classified: Secondary | ICD-10-CM | POA: Diagnosis not present

## 2014-01-05 DIAGNOSIS — I5022 Chronic systolic (congestive) heart failure: Secondary | ICD-10-CM

## 2014-01-05 NOTE — Progress Notes (Signed)
HPI:  Mrs. Dudek returns today for scheduled follow-up visit.   She is an 80 year old woman who presented with class III congestive heart failure. Workup revealed severe three-vessel disease and ischemic cardiomyopathy with ejection fraction of 20-25%.   She underwent coronary bypass grafting x4 on July 31. Postoperatively she had some atrial fibrillation which was treated with amiodarone. She was discharged on August 12 to [facility].  She was last seen in the office on September 15. She was making good progress and was discharged home after that.  She continues to work with home PT and OT. She still has some difficulties with steps. She is using a walker. She does not have any incisional pain. She says that her feet swelled up after she ate some [place] food, but otherwise edema has not been an issue.  Past Medical History  Diagnosis Date  . DM2 (diabetes mellitus, type 2)   . Hypertension   . Hyperlipidemia   . GERD (gastroesophageal reflux disease)   . Osteopenia   . CAD (coronary artery disease), native coronary artery 10/06/2013    LAD 90%, CFX 90%, RCA 100% w/ collat  . Ischemic dilated cardiomyopathy 09/2013    EF 20-25% by echo  . Chronic combined systolic and diastolic CHF, NYHA class 2 09/2013  . NSVT (nonsustained ventricular tachycardia) 09/2013  . OAB (overactive bladder)   . Hypothyroidism   . CVA (cerebral vascular accident) 1968    leaving no deficit  . Arthritis     "knees" (10/08/2013)  . Breast cancer     "left; I had 62 sessions of radiation"       Current Outpatient Prescriptions  Medication Sig Dispense Refill  . acetaminophen (TYLENOL) 325 MG tablet Take 650 mg by mouth every 6 (six) hours as needed.      Marland Kitchen amiodarone (PACERONE) 200 MG tablet TAKE 1 TABLET ONCE DAILY.  30 tablet  9  . aspirin EC 325 MG EC tablet Take 1 tablet (325 mg total) by mouth daily.  30 tablet  0  . atorvastatin (LIPITOR) 80 MG tablet Take 1 tablet by mouth daily.      .  carvedilol (COREG) 6.25 MG tablet TAKE 1 TABLET TWICE DAILY.  60 tablet  9  . cholecalciferol (VITAMIN D) 1000 UNITS tablet Take 1,000 Units by mouth daily.      . feeding supplement, GLUCERNA SHAKE, (GLUCERNA SHAKE) LIQD Take 237 mLs by mouth 2 (two) times daily between meals.    0  . ferrous sulfate 325 (65 FE) MG tablet Take 1 tablet (325 mg total) by mouth daily with breakfast.    3  . levothyroxine (SYNTHROID, LEVOTHROID) 112 MCG tablet Take 1 tablet by mouth daily.      Marland Kitchen lisinopril-hydrochlorothiazide (PRINZIDE,ZESTORETIC) 20-12.5 MG per tablet Take 2 tablets by mouth daily.      . Magnesium Oxide 400 (240 MG) MG TABS TAKE 1 TABLET TWICE DAILY.  60 tablet  9  . metFORMIN (GLUCOPHAGE) 1000 MG tablet Take 1 tablet by mouth 2 (two) times daily.      Marland Kitchen oxybutynin (DITROPAN XL) 15 MG 24 hr tablet Take 2 tablets by mouth daily.      . pantoprazole (PROTONIX) 40 MG tablet Take 1 tablet by mouth daily.      . VOLTAREN 1 % GEL Apply 1 application topically daily.       No current facility-administered medications for this visit.    Physical Exam BP 157/73  Pulse 65  Ht 5'  6" (1.676 m)  Wt 147 lb (66.679 kg)  BMI 23.74 kg/m2  SpO35 31% 80 year old woman in no acute distress Alert and oriented 3 with no focal neurologic deficits Sternum stable, incision clean dry and intact Cardiac regular rate and rhythm normal S1 and S2, 2/6 systolic murmur Lungs clear with equal respiratory bilaterally 1+ edema feet and ankles bilaterally  Diagnostic Tests: CHEST 2 VIEW  COMPARISON: 11/24/2013  FINDINGS:  Postop CABG. Heart size upper normal. Vascularity is normal.  Improvement in the left pleural effusion. Small bilateral effusions  remain. Negative for edema.  IMPRESSION:  Improving left effusion. Small bilateral effusions are present  without heart failure.  Electronically Signed  By: Franchot Gallo M.D.  On: 01/05/2014 13:55   Impression: 80 year old woman who is now almost 3 months out  from coronary bypass grafting 4 for severe three-vessel coronary disease with ischemic cardiomyopathy. She presented with class III heart failure. She is continuing to progress slowly. Overall I am thrilled with how she is doing.  I did caution her to be very careful with her diet, and in particular to watch her sodium intake.  At this point her activities are unrestricted. I do think she would be okay to drive on a limited basis. I did discuss appropriate precautions with her.  Plan: Follow up with Dr. Sallyanne Kuster  I will be happy to see her back at any time if I can be of any further assistance with her care

## 2014-01-08 DIAGNOSIS — E119 Type 2 diabetes mellitus without complications: Secondary | ICD-10-CM | POA: Diagnosis not present

## 2014-01-08 DIAGNOSIS — I5022 Chronic systolic (congestive) heart failure: Secondary | ICD-10-CM | POA: Diagnosis not present

## 2014-01-08 DIAGNOSIS — I10 Essential (primary) hypertension: Secondary | ICD-10-CM | POA: Diagnosis not present

## 2014-01-08 DIAGNOSIS — E785 Hyperlipidemia, unspecified: Secondary | ICD-10-CM | POA: Diagnosis not present

## 2014-01-08 DIAGNOSIS — Z48812 Encounter for surgical aftercare following surgery on the circulatory system: Secondary | ICD-10-CM | POA: Diagnosis not present

## 2014-01-08 DIAGNOSIS — I2510 Atherosclerotic heart disease of native coronary artery without angina pectoris: Secondary | ICD-10-CM | POA: Diagnosis not present

## 2014-01-11 ENCOUNTER — Telehealth: Payer: Self-pay | Admitting: Cardiovascular Disease

## 2014-01-11 DIAGNOSIS — I2510 Atherosclerotic heart disease of native coronary artery without angina pectoris: Secondary | ICD-10-CM | POA: Diagnosis not present

## 2014-01-11 DIAGNOSIS — Z48812 Encounter for surgical aftercare following surgery on the circulatory system: Secondary | ICD-10-CM | POA: Diagnosis not present

## 2014-01-11 DIAGNOSIS — I5022 Chronic systolic (congestive) heart failure: Secondary | ICD-10-CM | POA: Diagnosis not present

## 2014-01-11 DIAGNOSIS — E785 Hyperlipidemia, unspecified: Secondary | ICD-10-CM | POA: Diagnosis not present

## 2014-01-11 DIAGNOSIS — I10 Essential (primary) hypertension: Secondary | ICD-10-CM | POA: Diagnosis not present

## 2014-01-11 DIAGNOSIS — E119 Type 2 diabetes mellitus without complications: Secondary | ICD-10-CM | POA: Diagnosis not present

## 2014-01-11 NOTE — Telephone Encounter (Signed)
Close encounter 

## 2014-01-14 DIAGNOSIS — I5022 Chronic systolic (congestive) heart failure: Secondary | ICD-10-CM | POA: Diagnosis not present

## 2014-01-14 DIAGNOSIS — E119 Type 2 diabetes mellitus without complications: Secondary | ICD-10-CM | POA: Diagnosis not present

## 2014-01-14 DIAGNOSIS — I10 Essential (primary) hypertension: Secondary | ICD-10-CM | POA: Diagnosis not present

## 2014-01-14 DIAGNOSIS — E785 Hyperlipidemia, unspecified: Secondary | ICD-10-CM | POA: Diagnosis not present

## 2014-01-14 DIAGNOSIS — Z48812 Encounter for surgical aftercare following surgery on the circulatory system: Secondary | ICD-10-CM | POA: Diagnosis not present

## 2014-01-14 DIAGNOSIS — I2510 Atherosclerotic heart disease of native coronary artery without angina pectoris: Secondary | ICD-10-CM | POA: Diagnosis not present

## 2014-01-15 DIAGNOSIS — I2510 Atherosclerotic heart disease of native coronary artery without angina pectoris: Secondary | ICD-10-CM | POA: Diagnosis not present

## 2014-01-15 DIAGNOSIS — E119 Type 2 diabetes mellitus without complications: Secondary | ICD-10-CM | POA: Diagnosis not present

## 2014-01-15 DIAGNOSIS — Z48812 Encounter for surgical aftercare following surgery on the circulatory system: Secondary | ICD-10-CM | POA: Diagnosis not present

## 2014-01-15 DIAGNOSIS — I5022 Chronic systolic (congestive) heart failure: Secondary | ICD-10-CM | POA: Diagnosis not present

## 2014-01-15 DIAGNOSIS — I10 Essential (primary) hypertension: Secondary | ICD-10-CM | POA: Diagnosis not present

## 2014-01-15 DIAGNOSIS — E785 Hyperlipidemia, unspecified: Secondary | ICD-10-CM | POA: Diagnosis not present

## 2014-01-21 DIAGNOSIS — I2510 Atherosclerotic heart disease of native coronary artery without angina pectoris: Secondary | ICD-10-CM | POA: Diagnosis not present

## 2014-01-21 DIAGNOSIS — I4891 Unspecified atrial fibrillation: Secondary | ICD-10-CM | POA: Diagnosis not present

## 2014-01-21 DIAGNOSIS — Z951 Presence of aortocoronary bypass graft: Secondary | ICD-10-CM | POA: Diagnosis not present

## 2014-01-21 DIAGNOSIS — Z23 Encounter for immunization: Secondary | ICD-10-CM | POA: Diagnosis not present

## 2014-01-22 DIAGNOSIS — I10 Essential (primary) hypertension: Secondary | ICD-10-CM | POA: Diagnosis not present

## 2014-01-22 DIAGNOSIS — Z48812 Encounter for surgical aftercare following surgery on the circulatory system: Secondary | ICD-10-CM | POA: Diagnosis not present

## 2014-01-22 DIAGNOSIS — E785 Hyperlipidemia, unspecified: Secondary | ICD-10-CM | POA: Diagnosis not present

## 2014-01-22 DIAGNOSIS — I2510 Atherosclerotic heart disease of native coronary artery without angina pectoris: Secondary | ICD-10-CM | POA: Diagnosis not present

## 2014-01-22 DIAGNOSIS — I5022 Chronic systolic (congestive) heart failure: Secondary | ICD-10-CM | POA: Diagnosis not present

## 2014-01-22 DIAGNOSIS — E119 Type 2 diabetes mellitus without complications: Secondary | ICD-10-CM | POA: Diagnosis not present

## 2014-01-27 DIAGNOSIS — Z48812 Encounter for surgical aftercare following surgery on the circulatory system: Secondary | ICD-10-CM | POA: Diagnosis not present

## 2014-01-27 DIAGNOSIS — I2510 Atherosclerotic heart disease of native coronary artery without angina pectoris: Secondary | ICD-10-CM | POA: Diagnosis not present

## 2014-01-27 DIAGNOSIS — E785 Hyperlipidemia, unspecified: Secondary | ICD-10-CM | POA: Diagnosis not present

## 2014-01-27 DIAGNOSIS — I10 Essential (primary) hypertension: Secondary | ICD-10-CM | POA: Diagnosis not present

## 2014-01-27 DIAGNOSIS — I5022 Chronic systolic (congestive) heart failure: Secondary | ICD-10-CM | POA: Diagnosis not present

## 2014-01-27 DIAGNOSIS — E119 Type 2 diabetes mellitus without complications: Secondary | ICD-10-CM | POA: Diagnosis not present

## 2014-01-28 ENCOUNTER — Ambulatory Visit (HOSPITAL_COMMUNITY): Payer: Medicare Other

## 2014-01-28 ENCOUNTER — Ambulatory Visit (HOSPITAL_COMMUNITY)
Admission: RE | Admit: 2014-01-28 | Discharge: 2014-01-28 | Disposition: A | Discharge status: Home / Self Care | Payer: Medicare Other | Source: Ambulatory Visit | Attending: Cardiology | Admitting: Cardiology

## 2014-01-28 DIAGNOSIS — E119 Type 2 diabetes mellitus without complications: Secondary | ICD-10-CM | POA: Insufficient documentation

## 2014-01-28 DIAGNOSIS — I428 Other cardiomyopathies: Secondary | ICD-10-CM | POA: Diagnosis not present

## 2014-01-28 DIAGNOSIS — I059 Rheumatic mitral valve disease, unspecified: Secondary | ICD-10-CM

## 2014-01-28 DIAGNOSIS — I5042 Chronic combined systolic (congestive) and diastolic (congestive) heart failure: Secondary | ICD-10-CM | POA: Diagnosis not present

## 2014-01-28 DIAGNOSIS — I2510 Atherosclerotic heart disease of native coronary artery without angina pectoris: Secondary | ICD-10-CM | POA: Diagnosis not present

## 2014-01-28 DIAGNOSIS — D62 Acute posthemorrhagic anemia: Secondary | ICD-10-CM | POA: Diagnosis not present

## 2014-01-28 DIAGNOSIS — Z951 Presence of aortocoronary bypass graft: Secondary | ICD-10-CM | POA: Diagnosis not present

## 2014-01-28 DIAGNOSIS — Z7982 Long term (current) use of aspirin: Secondary | ICD-10-CM | POA: Diagnosis not present

## 2014-01-28 DIAGNOSIS — I429 Cardiomyopathy, unspecified: Secondary | ICD-10-CM

## 2014-01-28 DIAGNOSIS — I10 Essential (primary) hypertension: Secondary | ICD-10-CM | POA: Diagnosis not present

## 2014-01-28 NOTE — Progress Notes (Signed)
2D Echo Performed 01/28/2014    Marygrace Drought, RCS

## 2014-02-02 ENCOUNTER — Encounter: Payer: Self-pay | Admitting: *Deleted

## 2014-02-03 ENCOUNTER — Telehealth: Payer: Self-pay | Admitting: Cardiovascular Disease

## 2014-02-03 NOTE — Telephone Encounter (Signed)
Marissa Jefferson wanted to find out when pts last a1c was done

## 2014-02-06 DIAGNOSIS — E119 Type 2 diabetes mellitus without complications: Secondary | ICD-10-CM | POA: Diagnosis not present

## 2014-02-06 DIAGNOSIS — I2510 Atherosclerotic heart disease of native coronary artery without angina pectoris: Secondary | ICD-10-CM | POA: Diagnosis not present

## 2014-02-06 DIAGNOSIS — D62 Acute posthemorrhagic anemia: Secondary | ICD-10-CM | POA: Diagnosis not present

## 2014-02-06 DIAGNOSIS — I5042 Chronic combined systolic (congestive) and diastolic (congestive) heart failure: Secondary | ICD-10-CM | POA: Diagnosis not present

## 2014-02-06 DIAGNOSIS — Z7982 Long term (current) use of aspirin: Secondary | ICD-10-CM | POA: Diagnosis not present

## 2014-02-06 DIAGNOSIS — I10 Essential (primary) hypertension: Secondary | ICD-10-CM | POA: Diagnosis not present

## 2014-02-08 ENCOUNTER — Ambulatory Visit: Payer: Medicare Other | Admitting: Cardiovascular Disease

## 2014-02-12 DIAGNOSIS — D62 Acute posthemorrhagic anemia: Secondary | ICD-10-CM | POA: Diagnosis not present

## 2014-02-12 DIAGNOSIS — Z7982 Long term (current) use of aspirin: Secondary | ICD-10-CM | POA: Diagnosis not present

## 2014-02-12 DIAGNOSIS — I5042 Chronic combined systolic (congestive) and diastolic (congestive) heart failure: Secondary | ICD-10-CM | POA: Diagnosis not present

## 2014-02-12 DIAGNOSIS — I2510 Atherosclerotic heart disease of native coronary artery without angina pectoris: Secondary | ICD-10-CM | POA: Diagnosis not present

## 2014-02-12 DIAGNOSIS — I10 Essential (primary) hypertension: Secondary | ICD-10-CM | POA: Diagnosis not present

## 2014-02-12 DIAGNOSIS — E119 Type 2 diabetes mellitus without complications: Secondary | ICD-10-CM | POA: Diagnosis not present

## 2014-02-15 DIAGNOSIS — E1329 Other specified diabetes mellitus with other diabetic kidney complication: Secondary | ICD-10-CM | POA: Diagnosis not present

## 2014-02-15 DIAGNOSIS — M949 Disorder of cartilage, unspecified: Secondary | ICD-10-CM | POA: Diagnosis not present

## 2014-02-15 DIAGNOSIS — I10 Essential (primary) hypertension: Secondary | ICD-10-CM | POA: Diagnosis not present

## 2014-02-15 DIAGNOSIS — E039 Hypothyroidism, unspecified: Secondary | ICD-10-CM | POA: Diagnosis not present

## 2014-02-17 DIAGNOSIS — I10 Essential (primary) hypertension: Secondary | ICD-10-CM | POA: Diagnosis not present

## 2014-02-17 DIAGNOSIS — I5042 Chronic combined systolic (congestive) and diastolic (congestive) heart failure: Secondary | ICD-10-CM | POA: Diagnosis not present

## 2014-02-17 DIAGNOSIS — I2510 Atherosclerotic heart disease of native coronary artery without angina pectoris: Secondary | ICD-10-CM | POA: Diagnosis not present

## 2014-02-17 DIAGNOSIS — E119 Type 2 diabetes mellitus without complications: Secondary | ICD-10-CM | POA: Diagnosis not present

## 2014-02-18 ENCOUNTER — Encounter (HOSPITAL_COMMUNITY): Payer: Self-pay | Admitting: Cardiovascular Disease

## 2014-02-18 DIAGNOSIS — E119 Type 2 diabetes mellitus without complications: Secondary | ICD-10-CM | POA: Diagnosis not present

## 2014-02-18 DIAGNOSIS — D62 Acute posthemorrhagic anemia: Secondary | ICD-10-CM | POA: Diagnosis not present

## 2014-02-18 DIAGNOSIS — I2510 Atherosclerotic heart disease of native coronary artery without angina pectoris: Secondary | ICD-10-CM | POA: Diagnosis not present

## 2014-02-18 DIAGNOSIS — I5042 Chronic combined systolic (congestive) and diastolic (congestive) heart failure: Secondary | ICD-10-CM | POA: Diagnosis not present

## 2014-02-18 DIAGNOSIS — Z7982 Long term (current) use of aspirin: Secondary | ICD-10-CM | POA: Diagnosis not present

## 2014-02-18 DIAGNOSIS — I10 Essential (primary) hypertension: Secondary | ICD-10-CM | POA: Diagnosis not present

## 2014-02-19 DIAGNOSIS — I10 Essential (primary) hypertension: Secondary | ICD-10-CM | POA: Diagnosis not present

## 2014-02-19 DIAGNOSIS — E785 Hyperlipidemia, unspecified: Secondary | ICD-10-CM | POA: Diagnosis not present

## 2014-02-19 DIAGNOSIS — E1121 Type 2 diabetes mellitus with diabetic nephropathy: Secondary | ICD-10-CM | POA: Diagnosis not present

## 2014-02-19 DIAGNOSIS — I2510 Atherosclerotic heart disease of native coronary artery without angina pectoris: Secondary | ICD-10-CM | POA: Diagnosis not present

## 2014-02-24 ENCOUNTER — Other Ambulatory Visit: Payer: Self-pay | Admitting: Adult Health

## 2014-02-25 DIAGNOSIS — D62 Acute posthemorrhagic anemia: Secondary | ICD-10-CM | POA: Diagnosis not present

## 2014-02-25 DIAGNOSIS — I5042 Chronic combined systolic (congestive) and diastolic (congestive) heart failure: Secondary | ICD-10-CM | POA: Diagnosis not present

## 2014-02-25 DIAGNOSIS — I10 Essential (primary) hypertension: Secondary | ICD-10-CM | POA: Diagnosis not present

## 2014-02-25 DIAGNOSIS — I2510 Atherosclerotic heart disease of native coronary artery without angina pectoris: Secondary | ICD-10-CM | POA: Diagnosis not present

## 2014-02-25 DIAGNOSIS — E119 Type 2 diabetes mellitus without complications: Secondary | ICD-10-CM | POA: Diagnosis not present

## 2014-02-25 DIAGNOSIS — Z7982 Long term (current) use of aspirin: Secondary | ICD-10-CM | POA: Diagnosis not present

## 2014-02-26 ENCOUNTER — Telehealth: Payer: Self-pay | Admitting: Cardiovascular Disease

## 2014-02-26 NOTE — Telephone Encounter (Signed)
Yes, I absolutely need to see her in follow-up, but it is not urgent

## 2014-02-26 NOTE — Telephone Encounter (Signed)
Dr. Sallyanne Kuster please review and recommend.

## 2014-02-26 NOTE — Telephone Encounter (Signed)
Pt wants to know if Dr C still need to see her? She did not come in [place] she was ill,she thought somebody was suppose to call back with an appointment.Please call and let her know.

## 2014-03-01 NOTE — Telephone Encounter (Signed)
Returned a call to patient informing her per Dr. Sallyanne Kuster that he does need to see her in follow up. Patient states that she will call to schedule appointment to see Dr. Sallyanne Kuster after the beginning of the year.

## 2014-03-02 DIAGNOSIS — I5042 Chronic combined systolic (congestive) and diastolic (congestive) heart failure: Secondary | ICD-10-CM | POA: Diagnosis not present

## 2014-03-02 DIAGNOSIS — D62 Acute posthemorrhagic anemia: Secondary | ICD-10-CM | POA: Diagnosis not present

## 2014-03-02 DIAGNOSIS — I2510 Atherosclerotic heart disease of native coronary artery without angina pectoris: Secondary | ICD-10-CM | POA: Diagnosis not present

## 2014-03-02 DIAGNOSIS — E119 Type 2 diabetes mellitus without complications: Secondary | ICD-10-CM | POA: Diagnosis not present

## 2014-03-02 DIAGNOSIS — Z7982 Long term (current) use of aspirin: Secondary | ICD-10-CM | POA: Diagnosis not present

## 2014-03-02 DIAGNOSIS — I10 Essential (primary) hypertension: Secondary | ICD-10-CM | POA: Diagnosis not present

## 2014-03-03 DIAGNOSIS — I2510 Atherosclerotic heart disease of native coronary artery without angina pectoris: Secondary | ICD-10-CM | POA: Diagnosis not present

## 2014-03-03 DIAGNOSIS — D62 Acute posthemorrhagic anemia: Secondary | ICD-10-CM | POA: Diagnosis not present

## 2014-03-03 DIAGNOSIS — I10 Essential (primary) hypertension: Secondary | ICD-10-CM | POA: Diagnosis not present

## 2014-03-03 DIAGNOSIS — E119 Type 2 diabetes mellitus without complications: Secondary | ICD-10-CM | POA: Diagnosis not present

## 2014-03-03 DIAGNOSIS — I5042 Chronic combined systolic (congestive) and diastolic (congestive) heart failure: Secondary | ICD-10-CM | POA: Diagnosis not present

## 2014-03-03 DIAGNOSIS — Z7982 Long term (current) use of aspirin: Secondary | ICD-10-CM | POA: Diagnosis not present

## 2014-03-08 DIAGNOSIS — E119 Type 2 diabetes mellitus without complications: Secondary | ICD-10-CM | POA: Diagnosis not present

## 2014-03-08 DIAGNOSIS — D62 Acute posthemorrhagic anemia: Secondary | ICD-10-CM | POA: Diagnosis not present

## 2014-03-08 DIAGNOSIS — I2510 Atherosclerotic heart disease of native coronary artery without angina pectoris: Secondary | ICD-10-CM | POA: Diagnosis not present

## 2014-03-08 DIAGNOSIS — I10 Essential (primary) hypertension: Secondary | ICD-10-CM | POA: Diagnosis not present

## 2014-03-08 DIAGNOSIS — I5042 Chronic combined systolic (congestive) and diastolic (congestive) heart failure: Secondary | ICD-10-CM | POA: Diagnosis not present

## 2014-03-08 DIAGNOSIS — Z7982 Long term (current) use of aspirin: Secondary | ICD-10-CM | POA: Diagnosis not present

## 2014-03-10 ENCOUNTER — Telehealth: Payer: Self-pay | Admitting: *Deleted

## 2014-03-10 DIAGNOSIS — E119 Type 2 diabetes mellitus without complications: Secondary | ICD-10-CM | POA: Diagnosis not present

## 2014-03-10 DIAGNOSIS — I2510 Atherosclerotic heart disease of native coronary artery without angina pectoris: Secondary | ICD-10-CM | POA: Diagnosis not present

## 2014-03-10 DIAGNOSIS — I5042 Chronic combined systolic (congestive) and diastolic (congestive) heart failure: Secondary | ICD-10-CM | POA: Diagnosis not present

## 2014-03-10 DIAGNOSIS — Z7982 Long term (current) use of aspirin: Secondary | ICD-10-CM | POA: Diagnosis not present

## 2014-03-10 DIAGNOSIS — D62 Acute posthemorrhagic anemia: Secondary | ICD-10-CM | POA: Diagnosis not present

## 2014-03-10 DIAGNOSIS — I10 Essential (primary) hypertension: Secondary | ICD-10-CM | POA: Diagnosis not present

## 2014-03-10 NOTE — Telephone Encounter (Signed)
Signed order for SN care, PTA, OT and MCP faxed to [facility].

## 2014-03-21 ENCOUNTER — Emergency Department (HOSPITAL_COMMUNITY): Payer: Medicare HMO

## 2014-03-21 ENCOUNTER — Inpatient Hospital Stay (HOSPITAL_COMMUNITY)
Admission: EM | Admit: 2014-03-21 | Discharge: 2014-03-30 | DRG: 682 | Disposition: A | Discharge status: Skilled Nursing Facility | Payer: Medicare HMO | Attending: Internal Medicine | Admitting: Internal Medicine

## 2014-03-21 ENCOUNTER — Encounter (HOSPITAL_COMMUNITY): Payer: Self-pay | Admitting: Emergency Medicine

## 2014-03-21 DIAGNOSIS — E876 Hypokalemia: Secondary | ICD-10-CM | POA: Diagnosis present

## 2014-03-21 DIAGNOSIS — I2510 Atherosclerotic heart disease of native coronary artery without angina pectoris: Secondary | ICD-10-CM | POA: Diagnosis present

## 2014-03-21 DIAGNOSIS — I9789 Other postprocedural complications and disorders of the circulatory system, not elsewhere classified: Secondary | ICD-10-CM | POA: Diagnosis present

## 2014-03-21 DIAGNOSIS — I5042 Chronic combined systolic (congestive) and diastolic (congestive) heart failure: Secondary | ICD-10-CM | POA: Diagnosis present

## 2014-03-21 DIAGNOSIS — R0902 Hypoxemia: Secondary | ICD-10-CM | POA: Diagnosis present

## 2014-03-21 DIAGNOSIS — E440 Moderate protein-calorie malnutrition: Secondary | ICD-10-CM | POA: Diagnosis present

## 2014-03-21 DIAGNOSIS — R571 Hypovolemic shock: Secondary | ICD-10-CM | POA: Diagnosis present

## 2014-03-21 DIAGNOSIS — E039 Hypothyroidism, unspecified: Secondary | ICD-10-CM | POA: Diagnosis present

## 2014-03-21 DIAGNOSIS — I9589 Other hypotension: Secondary | ICD-10-CM

## 2014-03-21 DIAGNOSIS — R579 Shock, unspecified: Secondary | ICD-10-CM

## 2014-03-21 DIAGNOSIS — Z79899 Other long term (current) drug therapy: Secondary | ICD-10-CM

## 2014-03-21 DIAGNOSIS — Z853 Personal history of malignant neoplasm of breast: Secondary | ICD-10-CM | POA: Diagnosis not present

## 2014-03-21 DIAGNOSIS — E7800 Pure hypercholesterolemia, unspecified: Secondary | ICD-10-CM | POA: Diagnosis present

## 2014-03-21 DIAGNOSIS — Z66 Do not resuscitate: Secondary | ICD-10-CM | POA: Diagnosis present

## 2014-03-21 DIAGNOSIS — R570 Cardiogenic shock: Secondary | ICD-10-CM | POA: Diagnosis present

## 2014-03-21 DIAGNOSIS — I480 Paroxysmal atrial fibrillation: Secondary | ICD-10-CM | POA: Diagnosis present

## 2014-03-21 DIAGNOSIS — G9341 Metabolic encephalopathy: Secondary | ICD-10-CM | POA: Diagnosis present

## 2014-03-21 DIAGNOSIS — Z8673 Personal history of transient ischemic attack (TIA), and cerebral infarction without residual deficits: Secondary | ICD-10-CM | POA: Diagnosis not present

## 2014-03-21 DIAGNOSIS — N170 Acute kidney failure with tubular necrosis: Principal | ICD-10-CM | POA: Diagnosis present

## 2014-03-21 DIAGNOSIS — I10 Essential (primary) hypertension: Secondary | ICD-10-CM | POA: Diagnosis present

## 2014-03-21 DIAGNOSIS — I959 Hypotension, unspecified: Secondary | ICD-10-CM | POA: Diagnosis not present

## 2014-03-21 DIAGNOSIS — Z87891 Personal history of nicotine dependence: Secondary | ICD-10-CM

## 2014-03-21 DIAGNOSIS — N390 Urinary tract infection, site not specified: Secondary | ICD-10-CM | POA: Diagnosis present

## 2014-03-21 DIAGNOSIS — B9620 Unspecified Escherichia coli [E. coli] as the cause of diseases classified elsewhere: Secondary | ICD-10-CM | POA: Diagnosis present

## 2014-03-21 DIAGNOSIS — Z7982 Long term (current) use of aspirin: Secondary | ICD-10-CM | POA: Diagnosis not present

## 2014-03-21 DIAGNOSIS — R001 Bradycardia, unspecified: Secondary | ICD-10-CM | POA: Diagnosis not present

## 2014-03-21 DIAGNOSIS — I272 Other secondary pulmonary hypertension: Secondary | ICD-10-CM | POA: Diagnosis present

## 2014-03-21 DIAGNOSIS — K219 Gastro-esophageal reflux disease without esophagitis: Secondary | ICD-10-CM | POA: Diagnosis present

## 2014-03-21 DIAGNOSIS — E861 Hypovolemia: Secondary | ICD-10-CM | POA: Diagnosis present

## 2014-03-21 DIAGNOSIS — G92 Toxic encephalopathy: Secondary | ICD-10-CM | POA: Diagnosis present

## 2014-03-21 DIAGNOSIS — I255 Ischemic cardiomyopathy: Secondary | ICD-10-CM | POA: Diagnosis present

## 2014-03-21 DIAGNOSIS — Z951 Presence of aortocoronary bypass graft: Secondary | ICD-10-CM

## 2014-03-21 DIAGNOSIS — R531 Weakness: Secondary | ICD-10-CM

## 2014-03-21 DIAGNOSIS — E785 Hyperlipidemia, unspecified: Secondary | ICD-10-CM | POA: Diagnosis present

## 2014-03-21 DIAGNOSIS — Z6825 Body mass index (BMI) 25.0-25.9, adult: Secondary | ICD-10-CM

## 2014-03-21 DIAGNOSIS — E038 Other specified hypothyroidism: Secondary | ICD-10-CM | POA: Diagnosis present

## 2014-03-21 DIAGNOSIS — R778 Other specified abnormalities of plasma proteins: Secondary | ICD-10-CM | POA: Diagnosis present

## 2014-03-21 DIAGNOSIS — E871 Hypo-osmolality and hyponatremia: Secondary | ICD-10-CM | POA: Diagnosis present

## 2014-03-21 DIAGNOSIS — N179 Acute kidney failure, unspecified: Secondary | ICD-10-CM | POA: Diagnosis not present

## 2014-03-21 DIAGNOSIS — I248 Other forms of acute ischemic heart disease: Secondary | ICD-10-CM | POA: Diagnosis present

## 2014-03-21 DIAGNOSIS — E119 Type 2 diabetes mellitus without complications: Secondary | ICD-10-CM

## 2014-03-21 DIAGNOSIS — I5022 Chronic systolic (congestive) heart failure: Secondary | ICD-10-CM | POA: Diagnosis present

## 2014-03-21 DIAGNOSIS — D649 Anemia, unspecified: Secondary | ICD-10-CM | POA: Diagnosis present

## 2014-03-21 DIAGNOSIS — E875 Hyperkalemia: Secondary | ICD-10-CM | POA: Diagnosis present

## 2014-03-21 DIAGNOSIS — I499 Cardiac arrhythmia, unspecified: Secondary | ICD-10-CM | POA: Diagnosis not present

## 2014-03-21 DIAGNOSIS — R7989 Other specified abnormal findings of blood chemistry: Secondary | ICD-10-CM | POA: Diagnosis present

## 2014-03-21 DIAGNOSIS — I4891 Unspecified atrial fibrillation: Secondary | ICD-10-CM | POA: Diagnosis present

## 2014-03-21 LAB — TROPONIN I
Troponin I: 0.81 ng/mL (ref <0.031)
Troponin I: 0.51 ng/mL (ref <0.031)
Troponin I: 0.49 ng/mL — ABNORMAL HIGH (ref <0.031)

## 2014-03-21 LAB — COMPREHENSIVE METABOLIC PANEL
ALT: 30 U/L (ref 0–35)
AST: 54 U/L — AB (ref 0–37)
Albumin: 2.6 g/dL — ABNORMAL LOW (ref 3.5–5.2)
Alkaline Phosphatase: 37 U/L — ABNORMAL LOW (ref 39–117)
Anion gap: 27 — ABNORMAL HIGH (ref 5–15)
BUN: 65 mg/dL — AB (ref 6–23)
CO2: 14 mmol/L — ABNORMAL LOW (ref 19–32)
CREATININE: 4.21 mg/dL — AB (ref 0.50–1.10)
Calcium: 8.7 mg/dL (ref 8.4–10.5)
Chloride: 88 mEq/L — ABNORMAL LOW (ref 96–112)
GFR calc Af Amer: 10 mL/min — ABNORMAL LOW (ref >90)
GFR calc non Af Amer: 9 mL/min — ABNORMAL LOW (ref >90)
Glucose, Bld: 85 mg/dL (ref 70–99)
POTASSIUM: 5.8 mmol/L — AB (ref 3.5–5.1)
Sodium: 129 mmol/L — ABNORMAL LOW (ref 135–145)
Total Bilirubin: 0.7 mg/dL (ref 0.3–1.2)
Total Protein: 4.9 g/dL — ABNORMAL LOW (ref 6.0–8.3)

## 2014-03-21 LAB — CBC
HCT: 32.6 % — ABNORMAL LOW (ref 36.0–46.0)
HEMOGLOBIN: 10.5 g/dL — AB (ref 12.0–15.0)
MCH: 32.7 pg (ref 26.0–34.0)
MCHC: 32.2 g/dL (ref 30.0–36.0)
MCV: 101.6 fL — ABNORMAL HIGH (ref 78.0–100.0)
Platelets: 340 10*3/uL (ref 150–400)
RBC: 3.21 MIL/uL — ABNORMAL LOW (ref 3.87–5.11)
RDW: 13.5 % (ref 11.5–15.5)
WBC: 12.4 10*3/uL — ABNORMAL HIGH (ref 4.0–10.5)

## 2014-03-21 LAB — BASIC METABOLIC PANEL
Anion gap: 25 — ABNORMAL HIGH (ref 5–15)
BUN: 62 mg/dL — AB (ref 6–23)
CO2: 13 mmol/L — ABNORMAL LOW (ref 19–32)
Calcium: 8.5 mg/dL (ref 8.4–10.5)
Chloride: 95 mEq/L — ABNORMAL LOW (ref 96–112)
Creatinine, Ser: 3.84 mg/dL — ABNORMAL HIGH (ref 0.50–1.10)
GFR, EST AFRICAN AMERICAN: 11 mL/min — AB (ref >90)
GFR, EST NON AFRICAN AMERICAN: 10 mL/min — AB (ref >90)
Glucose, Bld: 104 mg/dL — ABNORMAL HIGH (ref 70–99)
POTASSIUM: 5.1 mmol/L (ref 3.5–5.1)
Sodium: 133 mmol/L — ABNORMAL LOW (ref 135–145)

## 2014-03-21 LAB — URINE MICROSCOPIC-ADD ON

## 2014-03-21 LAB — URINALYSIS, ROUTINE W REFLEX MICROSCOPIC
GLUCOSE, UA: NEGATIVE mg/dL
Hgb urine dipstick: NEGATIVE
KETONES UR: 15 mg/dL — AB
Nitrite: NEGATIVE
Protein, ur: 30 mg/dL — AB
Specific Gravity, Urine: 1.022 (ref 1.005–1.030)
Urobilinogen, UA: 0.2 mg/dL (ref 0.0–1.0)
pH: 5 (ref 5.0–8.0)

## 2014-03-21 LAB — PROCALCITONIN: Procalcitonin: 0.14 ng/mL

## 2014-03-21 LAB — GLUCOSE, CAPILLARY: Glucose-Capillary: 83 mg/dL (ref 70–99)

## 2014-03-21 LAB — LACTIC ACID, PLASMA: LACTIC ACID, VENOUS: 11.2 mmol/L — AB (ref 0.5–2.2)

## 2014-03-21 MED ORDER — SODIUM CHLORIDE 0.9 % IV SOLN
INTRAVENOUS | Status: DC
  Administered 2014-03-21: 10 mL via INTRAVENOUS
  Administered 2014-03-21 – 2014-03-26 (×2): via INTRAVENOUS

## 2014-03-21 MED ORDER — ENSURE COMPLETE PO LIQD
237.0000 mL | Freq: Two times a day (BID) | ORAL | Status: DC
  Administered 2014-03-22 (×2): 237 mL via ORAL

## 2014-03-21 MED ORDER — SODIUM CHLORIDE 0.9 % IV BOLUS (SEPSIS)
500.0000 mL | Freq: Once | INTRAVENOUS | Status: AC
  Administered 2014-03-21: 500 mL via INTRAVENOUS

## 2014-03-21 MED ORDER — LEVOTHYROXINE SODIUM 112 MCG PO TABS
112.0000 ug | ORAL_TABLET | Freq: Every day | ORAL | Status: DC
  Administered 2014-03-22 – 2014-03-26 (×5): 112 ug via ORAL
  Filled 2014-03-21 (×6): qty 1

## 2014-03-21 MED ORDER — PANTOPRAZOLE SODIUM 40 MG PO TBEC
40.0000 mg | DELAYED_RELEASE_TABLET | Freq: Every day | ORAL | Status: DC
  Administered 2014-03-21 – 2014-03-30 (×10): 40 mg via ORAL
  Filled 2014-03-21 (×9): qty 1

## 2014-03-21 MED ORDER — CALCIUM CHLORIDE 10 % IV SOLN
1.0000 g | Freq: Once | INTRAVENOUS | Status: AC
  Administered 2014-03-21: 1 g via INTRAVENOUS
  Filled 2014-03-21: qty 10

## 2014-03-21 MED ORDER — ASPIRIN EC 325 MG PO TBEC
325.0000 mg | DELAYED_RELEASE_TABLET | Freq: Every day | ORAL | Status: DC
  Administered 2014-03-21 – 2014-03-30 (×10): 325 mg via ORAL
  Filled 2014-03-21 (×10): qty 1

## 2014-03-21 MED ORDER — SODIUM BICARBONATE 8.4 % IV SOLN
50.0000 meq | Freq: Once | INTRAVENOUS | Status: AC
  Administered 2014-03-21: 50 meq via INTRAVENOUS
  Filled 2014-03-21: qty 50

## 2014-03-21 MED ORDER — VANCOMYCIN HCL IN DEXTROSE 1-5 GM/200ML-% IV SOLN
1000.0000 mg | Freq: Once | INTRAVENOUS | Status: AC
  Administered 2014-03-21: 1000 mg via INTRAVENOUS
  Filled 2014-03-21: qty 200

## 2014-03-21 MED ORDER — PIPERACILLIN-TAZOBACTAM IN DEX 2-0.25 GM/50ML IV SOLN
2.2500 g | Freq: Three times a day (TID) | INTRAVENOUS | Status: DC
  Administered 2014-03-21 – 2014-03-22 (×2): 2.25 g via INTRAVENOUS
  Filled 2014-03-21 (×6): qty 50

## 2014-03-21 MED ORDER — SODIUM POLYSTYRENE SULFONATE 15 GM/60ML PO SUSP
30.0000 g | Freq: Once | ORAL | Status: AC
  Administered 2014-03-21: 30 g via ORAL
  Filled 2014-03-21: qty 120

## 2014-03-21 MED ORDER — SODIUM CHLORIDE 0.9 % IV BOLUS (SEPSIS)
1000.0000 mL | INTRAVENOUS | Status: AC
  Administered 2014-03-21 (×2): 1000 mL via INTRAVENOUS

## 2014-03-21 MED ORDER — HEPARIN SODIUM (PORCINE) 5000 UNIT/ML IJ SOLN
5000.0000 [IU] | Freq: Three times a day (TID) | INTRAMUSCULAR | Status: DC
  Administered 2014-03-21 – 2014-03-30 (×26): 5000 [IU] via SUBCUTANEOUS
  Filled 2014-03-21 (×29): qty 1

## 2014-03-21 MED ORDER — LEVOTHYROXINE SODIUM 112 MCG PO TABS: 112.0000 ug | ORAL_TABLET | Freq: Every day | ORAL | Status: DC

## 2014-03-21 MED ORDER — ATORVASTATIN CALCIUM 80 MG PO TABS
80.0000 mg | ORAL_TABLET | Freq: Every day | ORAL | Status: DC
  Administered 2014-03-21 – 2014-03-30 (×10): 80 mg via ORAL
  Filled 2014-03-21 (×10): qty 1

## 2014-03-21 MED ORDER — ACETAMINOPHEN 325 MG PO TABS
650.0000 mg | ORAL_TABLET | Freq: Four times a day (QID) | ORAL | Status: DC | PRN
  Administered 2014-03-21 – 2014-03-30 (×10): 650 mg via ORAL
  Filled 2014-03-21 (×9): qty 2

## 2014-03-21 MED ORDER — ATROPINE SULFATE 1 MG/ML IJ SOLN
0.5000 mg | Freq: Once | INTRAMUSCULAR | Status: AC
  Administered 2014-03-21: 0.5 mg via INTRAVENOUS
  Filled 2014-03-21: qty 1

## 2014-03-21 MED ORDER — DOPAMINE-DEXTROSE 3.2-5 MG/ML-% IV SOLN: 2.0000 ug/kg/min | INTRAVENOUS | Status: DC

## 2014-03-21 MED ORDER — ONDANSETRON HCL 4 MG/2ML IJ SOLN
4.0000 mg | Freq: Four times a day (QID) | INTRAMUSCULAR | Status: DC | PRN
  Administered 2014-03-21: 4 mg via INTRAVENOUS
  Filled 2014-03-21: qty 2

## 2014-03-21 MED ORDER — ATROPINE SULFATE 1 MG/ML IJ SOLN
0.5000 mg | Freq: Once | INTRAMUSCULAR | Status: AC
  Administered 2014-03-21: 0.5 mg via INTRAVENOUS

## 2014-03-21 MED ORDER — PIPERACILLIN-TAZOBACTAM 3.375 G IVPB 30 MIN
3.3750 g | Freq: Once | INTRAVENOUS | Status: AC
  Administered 2014-03-21: 3.375 g via INTRAVENOUS
  Filled 2014-03-21: qty 50

## 2014-03-21 MED ORDER — INSULIN ASPART 100 UNIT/ML ~~LOC~~ SOLN
6.0000 [IU] | Freq: Once | SUBCUTANEOUS | Status: AC
  Administered 2014-03-21: 6 [IU] via INTRAVENOUS
  Filled 2014-03-21: qty 1

## 2014-03-21 MED ORDER — DEXTROSE 50 % IV SOLN
25.0000 g | Freq: Once | INTRAVENOUS | Status: AC
  Administered 2014-03-21: 25 g via INTRAVENOUS
  Filled 2014-03-21: qty 50

## 2014-03-21 NOTE — Consult Note (Signed)
CONSULTATION NOTE  Reason for Consult: Weakness, fall, bradycardia  Requesting Physician: Dr. Ashok Cordia  Cardiologist: Dr. Sallyanne Kuster  HPI: This is a 81 y.o. female with a past medical history significant for diabetes, HTN, nonsmoker with history of left breast cancer treated with radiation-lumpectomy 15 years ago. She was seen for CHF 10/05/13. Her echo showed an EF of 20-25%. She was admitted for outpatient right and left heart catheterization for symptoms of class III CHF on 10/06/13. This revealed severe 3V CAD and she underwent CABG x 4 10/09/13. Post op she had PAF Rx'd with Amiodarone. She was placed in a skilled nursing facility for rehabilitation but gradually improved. She was seen in the office on 11/17/2013 and was doing well at that time. She was subsequently seen in follow-up by Dr. Roxan Hockey, who performed her CABG. She was doing very well to time.  She now presents to the emergency room with weakness and was found to be hypotensive and bradycardic. CBC indicates a leukocytosis with a white blood cell count of 12,400 and CMET is markedly abnormal with sodium 129, potassium 5.8, chloride 88, CO2 14 creatinine of 4.21. Baseline creatinine is 0.6. Troponin is elevated 0.81 and H&H is 10 and 32. EKG shows marked sinus bradycardia in the 30's. Cardiology is asked to evaluate.  PMHx:  Past Medical History  Diagnosis Date  . DM2 (diabetes mellitus, type 2)   . Hypertension   . Hyperlipidemia   . GERD (gastroesophageal reflux disease)   . Osteopenia   . CAD (coronary artery disease), native coronary artery 10/06/2013    LAD 90%, CFX 90%, RCA 100% w/ collat  . Ischemic dilated cardiomyopathy 09/2013    EF 20-25% by echo  . Chronic combined systolic and diastolic CHF, NYHA class 2 09/2013  . NSVT (nonsustained ventricular tachycardia) 09/2013  . OAB (overactive bladder)   . Hypothyroidism   . CVA (cerebral vascular accident) 1968    leaving no deficit  . Arthritis    "knees" (10/08/2013)  . Breast cancer     "left; I had 62 sessions of radiation"   Past Surgical History  Procedure Laterality Date  . Colonoscopy  2005  . Cardiac catheterization  10/06/2013  . Cataract extraction w/ intraocular lens  implant, bilateral Bilateral ~ 2009  . Tonsillectomy and adenoidectomy  1930's  . Breast lumpectomy Left 1998  . Breast lumpectomy with axillary lymph node dissection Left 1998  . Coronary artery bypass graft [name] 10/09/2013    Procedure: CORONARY ARTERY BYPASS GRAFTING (CABG) x 4 using left internal mammary artery and right saphenous leg vein using endoscope.;  Surgeon: Melrose Nakayama, MD;  Location: [place];  Service: Open Heart Surgery;  Laterality: [name];  . Intraoperative transesophageal echocardiogram [name] 10/09/2013    Procedure: INTRAOPERATIVE TRANSESOPHAGEAL ECHOCARDIOGRAM;  Surgeon: Melrose Nakayama, MD;  Location: [place];  Service: Open Heart Surgery;  Laterality: [name];  . Left and right heart catheterization with coronary angiogram [name] 10/06/2013    Procedure: LEFT AND RIGHT HEART CATHETERIZATION WITH CORONARY ANGIOGRAM;  Surgeon: Sanda Klein, MD;  Location: [place] CATH LAB;  Service: Cardiovascular;  Laterality: [name];    FAMHx: History reviewed. No pertinent family history.  SOCHx:  reports that she has quit smoking. Her smoking use included Cigarettes. She smoked 0.00 packs per day. She has never used smokeless tobacco. She reports that she does not drink alcohol or use illicit drugs.  ALLERGIES: No Known Allergies  ROS: A comprehensive review of systems was negative except for:  Constitutional: positive for anorexia, chills, fatigue and malaise Musculoskeletal: positive for muscle weakness and falls  HOME MEDICATIONS: No current facility-administered medications on file prior to encounter.   Current Outpatient Prescriptions on File Prior to Encounter  Medication Sig Dispense Refill  . acetaminophen (TYLENOL) 325 MG tablet Take 650 mg by  mouth every 6 (six) hours as needed.    Marland Kitchen amiodarone (PACERONE) 200 MG tablet TAKE 1 TABLET ONCE DAILY. 30 tablet 9  . aspirin EC 325 MG EC tablet Take 1 tablet (325 mg total) by mouth daily. 30 tablet 0  . atorvastatin (LIPITOR) 80 MG tablet Take 1 tablet by mouth daily.    . carvedilol (COREG) 6.25 MG tablet TAKE 1 TABLET TWICE DAILY. 60 tablet 9  . cholecalciferol (VITAMIN D) 1000 UNITS tablet Take 1,000 Units by mouth daily.    . feeding supplement, GLUCERNA SHAKE, (GLUCERNA SHAKE) LIQD Take 237 mLs by mouth 2 (two) times daily between meals.  0  . ferrous sulfate 325 (65 FE) MG tablet Take 1 tablet (325 mg total) by mouth daily with breakfast.  3  . levothyroxine (SYNTHROID, LEVOTHROID) 112 MCG tablet Take 1 tablet by mouth daily.    Marland Kitchen lisinopril-hydrochlorothiazide (PRINZIDE,ZESTORETIC) 20-12.5 MG per tablet Take 2 tablets by mouth daily.    . Magnesium Oxide 400 (240 MG) MG TABS TAKE 1 TABLET TWICE DAILY. 60 tablet 9  . metFORMIN (GLUCOPHAGE) 1000 MG tablet Take 1 tablet by mouth 2 (two) times daily.    Marland Kitchen oxybutynin (DITROPAN XL) 15 MG 24 hr tablet Take 2 tablets by mouth daily.    . pantoprazole (PROTONIX) 40 MG tablet Take 1 tablet by mouth daily.    . VOLTAREN 1 % GEL Apply 1 application topically daily.     VITALS: Blood pressure 91/29, pulse 36, temperature 96.5 F (35.8 C), temperature source Axillary, resp. rate 14, SpO2 100 %.  PHYSICAL EXAM: General appearance: Lethargic, cool, awake, following commands, but sleepy Neck: JVD - 5 cm above sternal notch and no carotid bruit Lungs: diminished breath sounds bilaterally Heart: regular bradycardia Abdomen: soft, non-tender; bowel sounds normal; no masses,  no organomegaly Extremities: no edema, cool, poor pulses Pulses: diminished distally in UE./LE Skin: pale, cool Neurologic: Mental status: Somnolent, follows commands Psych: Flat affect  LABS: Results for orders placed or performed during the hospital encounter of  03/21/14 (from the past 48 hour(s))  CBC     Status: Abnormal   Collection Time: 03/21/14 10:45 AM  Result Value Ref Range   WBC 12.4 (H) 4.0 - 10.5 K/uL   RBC 3.21 (L) 3.87 - 5.11 MIL/uL   Hemoglobin 10.5 (L) 12.0 - 15.0 g/dL   HCT 32.6 (L) 36.0 - 46.0 %   MCV 101.6 (H) 78.0 - 100.0 fL   MCH 32.7 26.0 - 34.0 pg   MCHC 32.2 30.0 - 36.0 g/dL   RDW 13.5 11.5 - 15.5 %   Platelets 340 150 - 400 K/uL  Comprehensive metabolic panel     Status: Abnormal   Collection Time: 03/21/14 10:45 AM  Result Value Ref Range   Sodium 129 (L) 135 - 145 mmol/L    Comment: Please note change in reference range.   Potassium 5.8 (H) 3.5 - 5.1 mmol/L    Comment: Please note change in reference range. NO VISIBLE HEMOLYSIS    Chloride 88 (L) 96 - 112 mEq/L   CO2 14 (L) 19 - 32 mmol/L   Glucose, Bld 85 70 - 99 mg/dL   BUN  65 (H) 6 - 23 mg/dL   Creatinine, Ser 4.21 (H) 0.50 - 1.10 mg/dL   Calcium 8.7 8.4 - 10.5 mg/dL   Total Protein 4.9 (L) 6.0 - 8.3 g/dL   Albumin 2.6 (L) 3.5 - 5.2 g/dL   AST 54 (H) 0 - 37 U/L   ALT 30 0 - 35 U/L   Alkaline Phosphatase 37 (L) 39 - 117 U/L   Total Bilirubin 0.7 0.3 - 1.2 mg/dL   GFR calc non Af Amer 9 (L) >90 mL/min   GFR calc Af Amer 10 (L) >90 mL/min    Comment: (NOTE) The eGFR has been calculated using the CKD EPI equation. This calculation has not been validated in all clinical situations. eGFR's persistently <90 mL/min signify possible Chronic Kidney Disease.    Anion gap 27 (H) 5 - 15    Comment: REPEATED TO VERIFY  Troponin I     Status: Abnormal   Collection Time: 03/21/14 10:45 AM  Result Value Ref Range   Troponin I 0.81 (HH) <0.031 ng/mL    Comment:        POSSIBLE MYOCARDIAL ISCHEMIA. SERIAL TESTING RECOMMENDED. Please note change in reference range. REPEATED TO VERIFY CRITICAL RESULT CALLED TO, READ BACK BY AND VERIFIED WITH: Rae Lips RN AT 1216 03/21/14 BY WOOLLENK     IMAGING: No results found.  HOSPITAL DIAGNOSES: Principal  Problem:   Shock circulatory Active Problems:   Chronic systolic heart failure   HTN (hypertension)   Hyperlipidemia   Diabetes mellitus type 2, controlled   CAD (coronary artery disease), native coronary artery   Ischemic dilated cardiomyopathy   S/P CABG x 4   GERD (gastroesophageal reflux disease)   Hypotension   Bradycardia   Acute renal failure (ARF)   IMPRESSION: 1. Shock/refractory hypotension-she's received 2 fluid boluses without any improvement in blood pressure. She remains bradycardic. Laboratory work indicates severe acute renal failure. At this point I would recommend starting her on dopamine to increase heart rate and blood pressure. She does have a leukocytosis and this could represent septic shock. Critical care evaluation is recommended as well as nephrology evaluation for further workup. Is not clear whether or not she would be a candidate for dialysis or whether she would be interested. 2. Sinus bradycardia- likely related to combination of medications and acute shock. Most likely etiology could be infection however something is cause her to develop acute renal failure. I would hold her beta blocker and amiodarone given her bradycardia. Hopefully she will respond to dopamine. 3. Acute renal failure with electrolyte abnormalities- per the ER physician there is a need for acute management of electrolyte abnormalities including hyperkalemia which could be contributing to her bradycardia. In addition she will likely need short-term dialysis as she does appear volume overloaded 4. Elevated troponin-unclear whether this represents acute MI, demand ischemia or elevated due to heart failure and or shock. She could've feasibly lost a bypass graft however her EKG is benign, other than demonstrating bradycardia. There is no evidence for ischemia or recent infarct. Would recommend checking an echocardiogram to evaluate for new wall motion abnormalities or worsening LV function. She does  have a history of heart failure with EF of 20-30% in the past however this improved to 50-55% in November after her coronary bypass.  Cardiology will follow closely. Thank you for consulting [place].  CRITICAL CARE:  The patient is critically ill with multi-organ system failure and requires high complexity decision making for assessment and support, frequent evaluation and  titration of therapies, application of advanced monitoring technologies and extensive interpretation of multiple databases.  Time Spent Directly with Patient: 45 minutes  Pixie Casino, MD, [facility] Attending Cardiologist [facility]  HILTY,Kenneth C 03/21/2014, 12:45 PM

## 2014-03-21 NOTE — Progress Notes (Signed)
ANTIBIOTIC CONSULT NOTE - INITIAL  Pharmacy Consult for vancomycin, Zosyn Indication: rule out sepsis  No Known Allergies Patient Measurements: Weight: 135 lb (61.236 kg) Vital Signs: Temp: 96.5 F (35.8 C) (01/10 1022) Temp Source: Axillary (01/10 1022) BP: 119/33 mmHg (01/10 1300) Pulse Rate: 47 (01/10 1300) Labs:  Recent Labs  03/21/14 1045  WBC 12.4*  HGB 10.5*  PLT 340  CREATININE 4.21*   Estimated Creatinine Clearance: 8.8 mL/min (by C-G formula based on Cr of 4.21). No results for input(s): VANCOTROUGH, VANCOPEAK, VANCORANDOM, GENTTROUGH, GENTPEAK, GENTRANDOM, TOBRATROUGH, TOBRAPEAK, TOBRARND, AMIKACINPEAK, AMIKACINTROU, AMIKACIN in the last 72 hours.   Microbiology: No results found for this or any previous visit (from the past 720 hour(s)).  Medical History: Past Medical History  Diagnosis Date  . DM2 (diabetes mellitus, type 2)   . Hypertension   . Hyperlipidemia   . GERD (gastroesophageal reflux disease)   . Osteopenia   . CAD (coronary artery disease), native coronary artery 10/06/2013    LAD 90%, CFX 90%, RCA 100% w/ collat  . Ischemic dilated cardiomyopathy 09/2013    EF 20-25% by echo  . Chronic combined systolic and diastolic CHF, NYHA class 2 09/2013  . NSVT (nonsustained ventricular tachycardia) 09/2013  . OAB (overactive bladder)   . Hypothyroidism   . CVA (cerebral vascular accident) 1968    leaving no deficit  . Arthritis     "knees" (10/08/2013)  . Breast cancer     "left; I had 62 sessions of radiation"    Medications:  Anti-infectives    Start     Dose/Rate Route Frequency Ordered Stop   03/21/14 1245  piperacillin-tazobactam (ZOSYN) IVPB 3.375 g     3.375 g100 mL/hr over 30 Minutes Intravenous  Once 03/21/14 1238     03/21/14 1245  vancomycin (VANCOCIN) IVPB 1000 mg/200 mL premix     1,000 mg200 mL/hr over 60 Minutes Intravenous  Once 03/21/14 1238       Assessment: 81 year old female in ED with rule out sepsis given vancomycin  and zosyn x1 dose @ 1250 PM and pharmacy consulted for further dosing.   WBC 12.4, Hypothermic, Hypotensive Patient is in acute renal failure: SCr 4.21 (baseline 0.6) likely 2/2 to sepsis + metformin + ACE inhibitor. Current CrCl 8.8 mL/min  Goal of Therapy:  Vancomycin trough level 15-20 mcg/ml Clinical resolution of infection  Plan:  Zosyn 2.25g IV every 8 hours.  Patient received appropriate loading dose of vancomycin. Will follow labs and re-dose vancomycin based on levels.   Sloan Leiter, PharmD, BCPS Clinical Pharmacist [phone]  03/21/2014,1:08 PM

## 2014-03-21 NOTE — ED Notes (Signed)
Dr. Lamonte Sakai at the bedside.

## 2014-03-21 NOTE — H&P (Signed)
PULMONARY / CRITICAL CARE MEDICINE   Name: Sherrie Marsan MRN: [id number] DOB: July 05, 1924    ADMISSION DATE:  03/21/2014  CHIEF COMPLAINT:  Lethargy and weakness  INITIAL PRESENTATION: 81 year old woman history of CAD/CABG, ischemic cardiomyopathy, postop A. Fib, diabetes, hypertension, presenting with 3-4 days of poor intake and progressive malaise and weakness. In the ED she was severely bradycardic (sinus) and hypotensive. Labs identified acute renal failure with associated hyperkalemia and metabolic acidosis. She is admitted for further care 1/10. She states that her CODE STATUS should be DO NOT RESUSCITATE and that she is not interested in hemodialysis.   STUDIES:  TTE 1/10 >>   SIGNIFICANT EVENTS: CABG x 4 09/2013 (Dr Roxan Hockey)  HISTORY OF PRESENT ILLNESS:  81 year old woman with a history of remote treated breast cancer, hypertension, diabetes, and coronary artery disease identified in July 2015 comp related by ischemic cardiomyopathy. She underwent CABG, rehabilitated and is now living back at home. She states that about 2 weeks ago she suffered a fall with an injury to her coccyx. She does not believe she lost consciousness but instead it was a mechanical fall. Since that time she has not been doing as well. In particular she states that her by mouth intake has decreased. The last 4-5 days prior to admission she was barely eating or drinking anything, largely due to lack of appetite. She denied any fevers, upper respiratory symptoms including cough or mucus, chest discomfort, nausea vomiting diarrhea, or dysuria. She states that she was having normal bowel movements but that she has not needed to urinate since early 03/19/13. She has acutely more lethargic and weak so she presented to the emergency department. In the ED she was severely bradycardic (sinus) and hypotensive. Labs identified acute renal failure with associated hyperkalemia and metabolic acidosis. She is admitted for further care  1/10. She states that her CODE STATUS should be DO NOT RESUSCITATE and that she is not interested in hemodialysis. Her blood pressure and heart rate are improving slightly with volume resuscitation.   PAST MEDICAL HISTORY :   has a past medical history of DM2 (diabetes mellitus, type 2); Hypertension; Hyperlipidemia; GERD (gastroesophageal reflux disease); Osteopenia; CAD (coronary artery disease), native coronary artery (10/06/2013); Ischemic dilated cardiomyopathy (09/2013); Chronic combined systolic and diastolic CHF, NYHA class 2 (09/2013); NSVT (nonsustained ventricular tachycardia) (09/2013); OAB (overactive bladder); Hypothyroidism; CVA (cerebral vascular accident) (1968); Arthritis; and Breast cancer.  has past surgical history that includes Colonoscopy (2005); Cardiac catheterization (10/06/2013); Cataract extraction w/ intraocular lens  implant, bilateral (Bilateral, ~ 2009); Tonsillectomy and adenoidectomy (1930's); Breast lumpectomy (Left, 1998); Breast lumpectomy with axillary lymph node dissection (Left, 1998); Coronary artery bypass graft ([name], 10/09/2013); Intraoprative transesophageal echocardiogram ([name], 10/09/2013); and left and right heart catheterization with coronary angiogram ([name], 10/06/2013).   Prior to Admission medications   Medication Sig Start Date End Date Taking? Authorizing Provider  acetaminophen (TYLENOL) 325 MG tablet Take 650 mg by mouth every 6 (six) hours as needed.    Historical Provider, MD  amiodarone (PACERONE) 200 MG tablet TAKE 1 TABLET ONCE DAILY. 12/22/13   Mihai Croitoru, MD  aspirin EC 325 MG EC tablet Take 1 tablet (325 mg total) by mouth daily. 10/21/13   Wayne E Gold, PA-C  atorvastatin (LIPITOR) 80 MG tablet Take 1 tablet by mouth daily. 10/04/13   Historical Provider, MD  carvedilol (COREG) 6.25 MG tablet TAKE 1 TABLET TWICE DAILY. 12/22/13   Mihai Croitoru, MD  cholecalciferol (VITAMIN D) 1000 UNITS tablet Take 1,000 Units by mouth  daily.    Historical  Provider, MD  feeding supplement, GLUCERNA SHAKE, (GLUCERNA SHAKE) LIQD Take 237 mLs by mouth 2 (two) times daily between meals. 10/21/13   John Giovanni, PA-C  ferrous sulfate 325 (65 FE) MG tablet Take 1 tablet (325 mg total) by mouth daily with breakfast. 10/21/13   John Giovanni, PA-C  levothyroxine (SYNTHROID, LEVOTHROID) 112 MCG tablet Take 1 tablet by mouth daily. 09/24/13   Historical Provider, MD  lisinopril-hydrochlorothiazide (PRINZIDE,ZESTORETIC) 20-12.5 MG per tablet Take 2 tablets by mouth daily. 09/22/13   Historical Provider, MD  Magnesium Oxide 400 (240 MG) MG TABS TAKE 1 TABLET TWICE DAILY. 12/22/13   Mihai Croitoru, MD  metFORMIN (GLUCOPHAGE) 1000 MG tablet Take 1 tablet by mouth 2 (two) times daily. 09/11/13   Historical Provider, MD  oxybutynin (DITROPAN XL) 15 MG 24 hr tablet Take 2 tablets by mouth daily. 09/08/13   Historical Provider, MD  pantoprazole (PROTONIX) 40 MG tablet Take 1 tablet by mouth daily. 10/04/13   Historical Provider, MD  VOLTAREN 1 % GEL Apply 1 application topically daily. 09/02/13   Historical Provider, MD   No Known Allergies  FAMILY HISTORY:  indicated that her mother is deceased. She indicated that her father is deceased. She indicated that her maternal grandmother is deceased. She indicated that her maternal grandfather is deceased. She indicated that her paternal grandmother is deceased. She indicated that her paternal grandfather is deceased.  SOCIAL HISTORY:  reports that she has quit smoking. Her smoking use included Cigarettes. She smoked 0.00 packs per day. She has never used smokeless tobacco. She reports that she does not drink alcohol or use illicit drugs.  REVIEW OF SYSTEMS:  As described in the HPI  SUBJECTIVE:  She feels weak Complains of lower back pain after her fall  VITAL SIGNS: Temp:  [96.5 F (35.8 C)] 96.5 F (35.8 C) (01/10 1022) Pulse Rate:  [36-48] 44 (01/10 1320) Resp:  [12-21] 14 (01/10 1320) BP: (85-135)/(25-33) 135/29 mmHg  (01/10 1320) SpO2:  [81 %-100 %] 81 % (01/10 1320) Weight:  [61.236 kg (135 lb)] 61.236 kg (135 lb) (01/10 1256) HEMODYNAMICS:   VENTILATOR SETTINGS:   INTAKE / OUTPUT: No intake or output data in the 24 hours ending 03/21/14 1329  PHYSICAL EXAMINATION: General:  Thin ill-appearing woman Neuro:  Awake, interacting normally, moving all extremities, nonfocal exam HEENT:  OP very dry, PERRL, speech is slightly slurred due to dry mouth Cardiovascular:  Bradycardic and regular, HR 47, no M  Lungs:  Clear bilaterally Abdomen:  Soft, mild left lower quadrant tenderness, no guarding or rebound, positive bowel sounds Musculoskeletal:  No deformities Skin:  No rash  LABS:  CBC  Recent Labs Lab 03/21/14 1045  WBC 12.4*  HGB 10.5*  HCT 32.6*  PLT 340   Coag's No results for input(s): APTT, INR in the last 168 hours. BMET  Recent Labs Lab 03/21/14 1045  NA 129*  K 5.8*  CL 88*  CO2 14*  BUN 65*  CREATININE 4.21*  GLUCOSE 85   Electrolytes  Recent Labs Lab 03/21/14 1045  CALCIUM 8.7   Sepsis Markers No results for input(s): LATICACIDVEN, PROCALCITON, O2SATVEN in the last 168 hours. ABG No results for input(s): PHART, PCO2ART, PO2ART in the last 168 hours. Liver Enzymes  Recent Labs Lab 03/21/14 1045  AST 54*  ALT 30  ALKPHOS 37*  BILITOT 0.7  ALBUMIN 2.6*   Cardiac Enzymes  Recent Labs Lab 03/21/14 1045  TROPONINI 0.81*  Glucose No results for input(s): GLUCAP in the last 168 hours.  Imaging No results found.   ASSESSMENT / PLAN:  PULMONARY A: hypoxemia in the setting of shock P:   O2 and supportive care  CARDIOVASCULAR A: severe sinus bradycardia Associated cardiogenic shock with probable superimposed hypovolemic shock History of atrial fibrillation History coronary artery disease and CABG History of ischemic cardiomyopathy P:  In absence of other EKG changes suspect that her sinus bradycardia is due to severe hyperkalemia and renal  failure. We will address her underlying renal failure and correct electrolytes as able Hold amiodarone, lisinopril and carvedilol Appreciate Dr. Lysbeth Penner assistance - may be a candidate for short-term dopamine while we attempt to rehydrate and correct electrolytes. Her systolic pressure has improved with volume and at this time I believe we can defer the dopamine.   RENAL A:  Acute renal failure, suspect ATN due to hypovolemia/dehydration P:   Fluid resuscitation underway Hyperkalemia treated in the ED Will follow repeat BMP this afternoon and in the morning Check renal ultrasound Discussed the potential indication for hemodialysis with the patient. In general she states that she would not be interested in dialysis. I explained to her that depending on her progress there could be a role for temporary intermittent hemodialysis. She may be willing to consider this but emphasizes only short-term.   GASTROINTESTINAL A:  Poor PO intake P:   IVF hydration Push PO as able Protonix for SUP  HEMATOLOGIC A:  Leukocytosis P:    INFECTIOUS A:  Isolated leukocytosis without clear evidence for infection P:   BCx2 1/10 >>  UC 1/10 >>   Received vanco + zosyn x1 in ED. She denies any infectious prodrome or sx. Will check cx's and hold off on abx for now.   ENDOCRINE A: DM Hypothyroidism P:   Hold metformin Continue home Synthroid  NEUROLOGIC A:  Lethargy and weakness. These appear to be toxic metabolic P:   Supportive care and rehydration Avoid any sedating medications  GLOBAL:  Discussed goals of care and CODE STATUS with the patient. She states that she does want to get better with a goal towards going home for independent living. She would not want extraordinary care including mechanical ventilation, life support or ACLS. We broached the subject of hemodialysis as described above. In general she would not want dialysis although she understands that we may decide to discuss temporary  dialysis at some point during this hospitalization. She would be willing to at least entertain that discussion. She would like her CODE STATUS to be DO NOT RESUSCITATE. She states that her daughter is aware of her wishes.   FAMILY  - Updates: The patient has a son and a daughter who are involved in her care. Neither is present at this time  - Inter-disciplinary family meet or Palliative Care meeting due by: 03/28/14    TODAY'S SUMMARY: 81 year old woman in [facility] admitted with dehydration, acute renal failure, hyperkalemia and symptomatic severe bradycardia. Admitted for IV fluids and supportive care.    Independent CC time 60 minutes  Baltazar Apo, MD, PhD 03/21/2014, 2:16 PM [facility] [phone] or if no answer [phone]

## 2014-03-21 NOTE — ED Notes (Signed)
Antibiotics started after blood cultures were drawn.

## 2014-03-21 NOTE — ED Notes (Signed)
Attempted report x 2 

## 2014-03-21 NOTE — ED Notes (Signed)
EDP Steinl aware that no change in hypotension with NS fluid bolus.

## 2014-03-21 NOTE — ED Notes (Signed)
Per EMS, pt has been working with ohysical therapy to stop using a walker. Pt had an unwitness fall at home last Wednesday. Pt struck the back of her head and reports tailbone pain. Pt laid on the floor overnight until a friend helped her up. Pt has been mildly altered since with decrease in appetite. Pt alert x 4.  Pt falls asleep as soon as verbal stimuli is stopped.

## 2014-03-21 NOTE — ED Notes (Signed)
Pt wants the tylenol that she normally takes twice a day.

## 2014-03-21 NOTE — ED Provider Notes (Signed)
CSN: [id number]     Arrival date & time 03/21/14  1016 History   First MD Initiated Contact with Patient 03/21/14 1019     Chief Complaint  Patient presents with  . Bradycardia  . Fall     (Consider location/radiation/quality/duration/timing/severity/associated sxs/prior Treatment) Patient is a 81 y.o. female presenting with fall. The history is provided by the patient.  Fall Pertinent negatives include no chest pain, no abdominal pain, no headaches and no shortness of breath.  pt with hx ischemic cardiomyopathy EF 25%, cad s/p cabg 09/2013, afib, on amiodarone and carvedilol, c/o feeling generally weak and fatigued for the past 3 days. Symptoms constant, persistent.  States first noticed 3 days ago while in kitchen, states was stirring a pot and bilateral legs gave way, falling onto 'tailbone'.  Denies syncope w event, but also states is unsure what made her weak.  Since then generally weak, fatigued, and lightheaded/dizzy when stands. w fall 3 days ago, denies other injury. No head injury or headaches. No neck or mid/upper back pain. C/o very low back and sacral area pain, dull, moderate, non radiating.  Pt states taking normal meds as prescribed, no recent change in meds or doses.  Pt arrives via ems, noted to be bradycardic in route w hr in 30's.    Past Medical History  Diagnosis Date  . DM2 (diabetes mellitus, type 2)   . Hypertension   . Hyperlipidemia   . GERD (gastroesophageal reflux disease)   . Osteopenia   . CAD (coronary artery disease), native coronary artery 10/06/2013    LAD 90%, CFX 90%, RCA 100% w/ collat  . Ischemic dilated cardiomyopathy 09/2013    EF 20-25% by echo  . Chronic combined systolic and diastolic CHF, NYHA class 2 09/2013  . NSVT (nonsustained ventricular tachycardia) 09/2013  . OAB (overactive bladder)   . Hypothyroidism   . CVA (cerebral vascular accident) 1968    leaving no deficit  . Arthritis     "knees" (10/08/2013)  . Breast cancer     "left;  I had 62 sessions of radiation"   Past Surgical History  Procedure Laterality Date  . Colonoscopy  2005  . Cardiac catheterization  10/06/2013  . Cataract extraction w/ intraocular lens  implant, bilateral Bilateral ~ 2009  . Tonsillectomy and adenoidectomy  1930's  . Breast lumpectomy Left 1998  . Breast lumpectomy with axillary lymph node dissection Left 1998  . Coronary artery bypass graft [name] 10/09/2013    Procedure: CORONARY ARTERY BYPASS GRAFTING (CABG) x 4 using left internal mammary artery and right saphenous leg vein using endoscope.;  Surgeon: Melrose Nakayama, MD;  Location: [facility];  Service: Open Heart Surgery;  Laterality: [name];  . Intraoperative transesophageal echocardiogram [name] 10/09/2013    Procedure: INTRAOPERATIVE TRANSESOPHAGEAL ECHOCARDIOGRAM;  Surgeon: Melrose Nakayama, MD;  Location: [facility];  Service: Open Heart Surgery;  Laterality: [name];  . Left and right heart catheterization with coronary angiogram [name] 10/06/2013    Procedure: LEFT AND RIGHT HEART CATHETERIZATION WITH CORONARY ANGIOGRAM;  Surgeon: Sanda Klein, MD;  Location: [facility] CATH [facility];  Service: Cardiovascular;  Laterality: [name];   No family history on file. History  Substance Use Topics  . Smoking status: Former Smoker    Types: Cigarettes  . Smokeless tobacco: Never Used     Comment: "smoked in college; not much; not long"  . Alcohol Use: No   OB History    No data available     Review of Systems  Constitutional: Negative for fever and chills.  HENT: Negative for sore throat.   Eyes: Negative for visual disturbance.  Respiratory: Negative for cough and shortness of breath.   Cardiovascular: Negative for chest pain.  Gastrointestinal: Negative for nausea, vomiting and abdominal pain.  Endocrine: Negative for polyuria.  Genitourinary: Negative for dysuria and flank pain.  Musculoskeletal: Negative for back pain and neck pain.  Skin: Negative for rash.  Neurological: Positive for  light-headedness. Negative for numbness and headaches.  Hematological: Does not bruise/bleed easily.  Psychiatric/Behavioral: Negative for confusion.      Allergies  Review of patient's allergies indicates no known allergies.  Home Medications   Prior to Admission medications   Medication Sig Start Date End Date Taking? Authorizing Provider  acetaminophen (TYLENOL) 325 MG tablet Take 650 mg by mouth every 6 (six) hours as needed.    Historical Provider, MD  amiodarone (PACERONE) 200 MG tablet TAKE 1 TABLET ONCE DAILY. 12/22/13   Mihai Croitoru, MD  aspirin EC 325 MG EC tablet Take 1 tablet (325 mg total) by mouth daily. 10/21/13   Wayne E Gold, PA-C  atorvastatin (LIPITOR) 80 MG tablet Take 1 tablet by mouth daily. 10/04/13   Historical Provider, MD  carvedilol (COREG) 6.25 MG tablet TAKE 1 TABLET TWICE DAILY. 12/22/13   Mihai Croitoru, MD  cholecalciferol (VITAMIN D) 1000 UNITS tablet Take 1,000 Units by mouth daily.    Historical Provider, MD  feeding supplement, GLUCERNA SHAKE, (GLUCERNA SHAKE) LIQD Take 237 mLs by mouth 2 (two) times daily between meals. 10/21/13   John Giovanni, PA-C  ferrous sulfate 325 (65 FE) MG tablet Take 1 tablet (325 mg total) by mouth daily with breakfast. 10/21/13   John Giovanni, PA-C  levothyroxine (SYNTHROID, LEVOTHROID) 112 MCG tablet Take 1 tablet by mouth daily. 09/24/13   Historical Provider, MD  lisinopril-hydrochlorothiazide (PRINZIDE,ZESTORETIC) 20-12.5 MG per tablet Take 2 tablets by mouth daily. 09/22/13   Historical Provider, MD  Magnesium Oxide 400 (240 MG) MG TABS TAKE 1 TABLET TWICE DAILY. 12/22/13   Mihai Croitoru, MD  metFORMIN (GLUCOPHAGE) 1000 MG tablet Take 1 tablet by mouth 2 (two) times daily. 09/11/13   Historical Provider, MD  oxybutynin (DITROPAN XL) 15 MG 24 hr tablet Take 2 tablets by mouth daily. 09/08/13   Historical Provider, MD  pantoprazole (PROTONIX) 40 MG tablet Take 1 tablet by mouth daily. 10/04/13   Historical Provider, MD  VOLTAREN  1 % GEL Apply 1 application topically daily. 09/02/13   Historical Provider, MD   BP 96/33 mmHg  Pulse 40  Temp(Src) 96.5 F (35.8 C) (Axillary)  Resp 18  SpO2 99% Physical Exam  Constitutional: She is oriented to person, place, and time. She appears well-developed and well-nourished.  Pt markedly bradycardic, bp low  HENT:  Head: Atraumatic.  Nose: Nose normal.  Mouth/Throat: Oropharynx is clear and moist.  Eyes: Conjunctivae are normal. Pupils are equal, round, and reactive to light. No scleral icterus.  Neck: Neck supple. No tracheal deviation present. No thyromegaly present.  No bruit  Cardiovascular: Regular rhythm and intact distal pulses.   Murmur heard. bradycardia  Pulmonary/Chest: Effort normal and breath sounds normal. No respiratory distress.  Abdominal: Soft. Normal appearance and bowel sounds are normal. She exhibits no distension. There is no tenderness.  No puls mass  Genitourinary:  No cva tenderness  Musculoskeletal: Normal range of motion. She exhibits no edema or tenderness.  Mild lower lumbar and sacral tenderness, otherwise, CTLS spine, non tender, aligned, no step  off. Good rom bil ext without pain or focal bony tenderness.   Neurological: She is alert and oriented to person, place, and time.  Motor intact bil. sens grossly intact.   Skin: Skin is warm and dry. No rash noted.  Psychiatric: She has a normal mood and affect.  Nursing note and vitals reviewed.   ED Course  Procedures (including critical care time) Labs Review     EKG Interpretation   Date/Time:  Sunday March 21 2014 10:17:05 EST Ventricular Rate:  39 PR Interval:  221 QRS Duration: 122 QT Interval:  581 QTC Calculation: 468 R Axis:   1 Text Interpretation:  Sinus bradycardia Nonspecific intraventricular  conduction delay `severe bradycardia new, as compared to prior ecgs  Confirmed by Samiah Ricklefs  MD, Hanish Laraia ([id number]) on 03/21/2014 10:20:44 AM      MDM   Iv ns. Continuous pulse  ox and monitor. o2 [place]. Labs.   Stat cardiology consult.  Reviewed nursing notes and prior charts for additional history.   Pt markedly bradycardic, and bp soft, pt symptomatic, atropine iv.  Iv ns bolus.  Minimal improvement in hr to 40.  Additional dose atropine given, hr 42, bp 98/50. No cp or sob.   Awaiting labs.  Cardiology seeing pt.   Labs return, k high 5.8 w new/acute kidney injury, hco3 low.  bp now lower than previous 85/40. No cp or sob.  Additional ivf.  Cacl iv.  d50 iv, hco3 iv, novolog iv. Kayexalate po ordered. Critical care team called.   Discussed pt with Dr Lamonte Sakai - he will see in ED/admit.    Trop elev .81, hr still low 40 - cardiology indicates no emergent pacer, they recommend holding amio and carvedilol, and they will follow, rec admit to medical icu.  ua still pending - urine/cxs, will cover w abx pending results.   CRITICAL CARE  RE severe bradycardia, hypotension, acute kidney injury, volume depletion, hyperkalemia r/o infxn/uti.  Performed by: Mirna Mires Total critical care time: 45 Critical care time was exclusive of separately billable procedures and treating other patients. Critical care was necessary to treat or prevent imminent or life-threatening deterioration. Critical care was time spent personally by me on the following activities: development of treatment plan with patient and/or surrogate as well as nursing, discussions with consultants, evaluation of patient's response to treatment, examination of patient, obtaining history from patient or surrogate, ordering and performing treatments and interventions, ordering and review of laboratory studies, ordering and review of radiographic studies, pulse oximetry and re-evaluation of patient's condition.      Mirna Mires, MD 03/21/14 [phone]

## 2014-03-21 NOTE — ED Notes (Signed)
Attempted reports x1.  

## 2014-03-22 DIAGNOSIS — I951 Orthostatic hypotension: Secondary | ICD-10-CM

## 2014-03-22 DIAGNOSIS — I255 Ischemic cardiomyopathy: Secondary | ICD-10-CM

## 2014-03-22 DIAGNOSIS — I059 Rheumatic mitral valve disease, unspecified: Secondary | ICD-10-CM

## 2014-03-22 LAB — BASIC METABOLIC PANEL
Anion gap: 26 — ABNORMAL HIGH (ref 5–15)
Anion gap: 28 — ABNORMAL HIGH (ref 5–15)
BUN: 74 mg/dL — AB (ref 6–23)
BUN: 83 mg/dL — ABNORMAL HIGH (ref 6–23)
CALCIUM: 8.5 mg/dL (ref 8.4–10.5)
CO2: 11 mmol/L — AB (ref 19–32)
CO2: 14 mmol/L — ABNORMAL LOW (ref 19–32)
CREATININE: 4.4 mg/dL — AB (ref 0.50–1.10)
Calcium: 8.5 mg/dL (ref 8.4–10.5)
Chloride: 93 mEq/L — ABNORMAL LOW (ref 96–112)
Chloride: 91 mEq/L — ABNORMAL LOW (ref 96–112)
Creatinine, Ser: 4.62 mg/dL — ABNORMAL HIGH (ref 0.50–1.10)
GFR calc Af Amer: 10 mL/min — ABNORMAL LOW (ref >90)
GFR calc non Af Amer: 8 mL/min — ABNORMAL LOW (ref >90)
GFR calc non Af Amer: 8 mL/min — ABNORMAL LOW (ref >90)
GFR, EST AFRICAN AMERICAN: 9 mL/min — AB (ref >90)
GLUCOSE: 92 mg/dL (ref 70–99)
GLUCOSE: 135 mg/dL — AB (ref 70–99)
Potassium: 5.4 mmol/L — ABNORMAL HIGH (ref 3.5–5.1)
Potassium: 5.1 mmol/L (ref 3.5–5.1)
Sodium: 130 mmol/L — ABNORMAL LOW (ref 135–145)
Sodium: 133 mmol/L — ABNORMAL LOW (ref 135–145)

## 2014-03-22 LAB — CBC
HCT: 33.9 % — ABNORMAL LOW (ref 36.0–46.0)
Hemoglobin: 10.8 g/dL — ABNORMAL LOW (ref 12.0–15.0)
MCH: 32.9 pg (ref 26.0–34.0)
MCHC: 31.9 g/dL (ref 30.0–36.0)
MCV: 103.4 fL — ABNORMAL HIGH (ref 78.0–100.0)
Platelets: 338 10*3/uL (ref 150–400)
RBC: 3.28 MIL/uL — ABNORMAL LOW (ref 3.87–5.11)
RDW: 13.6 % (ref 11.5–15.5)
WBC: 16.5 10*3/uL — ABNORMAL HIGH (ref 4.0–10.5)

## 2014-03-22 LAB — URINE MICROSCOPIC-ADD ON

## 2014-03-22 LAB — URINALYSIS, ROUTINE W REFLEX MICROSCOPIC
Glucose, UA: NEGATIVE mg/dL
Hgb urine dipstick: NEGATIVE
Ketones, ur: 15 mg/dL — AB
Nitrite: NEGATIVE
PH: 5 (ref 5.0–8.0)
Protein, ur: 30 mg/dL — AB
SPECIFIC GRAVITY, URINE: 1.024 (ref 1.005–1.030)
UROBILINOGEN UA: 0.2 mg/dL (ref 0.0–1.0)

## 2014-03-22 LAB — CK: Total CK: 100 U/L (ref 7–177)

## 2014-03-22 LAB — MRSA PCR SCREENING: MRSA by PCR: NEGATIVE

## 2014-03-22 LAB — TROPONIN I
TROPONIN I: 0.48 ng/mL — AB (ref <0.031)
Troponin I: 0.48 ng/mL — ABNORMAL HIGH (ref <0.031)

## 2014-03-22 LAB — LACTIC ACID, PLASMA: Lactic Acid, Venous: 7.8 mmol/L — ABNORMAL HIGH (ref 0.5–2.2)

## 2014-03-22 MED ORDER — SODIUM POLYSTYRENE SULFONATE 15 GM/60ML PO SUSP
30.0000 g | Freq: Once | ORAL | Status: AC
  Administered 2014-03-22: 30 g via ORAL
  Filled 2014-03-22: qty 120

## 2014-03-22 MED ORDER — STERILE WATER FOR INJECTION IV SOLN
INTRAVENOUS | Status: DC
  Administered 2014-03-22 – 2014-03-24 (×4): via INTRAVENOUS
  Filled 2014-03-22 (×9): qty 850

## 2014-03-22 MED ORDER — BOOST PLUS PO LIQD
237.0000 mL | Freq: Two times a day (BID) | ORAL | Status: DC
  Administered 2014-03-23 – 2014-03-29 (×13): 237 mL via ORAL
  Filled 2014-03-22 (×19): qty 237

## 2014-03-22 MED ORDER — WHITE PETROLATUM GEL
Status: AC
Start: 2014-03-22 — End: 2014-03-22
  Administered 2014-03-22: 0.2
  Filled 2014-03-22: qty 1

## 2014-03-22 NOTE — Progress Notes (Signed)
INITIAL NUTRITION ASSESSMENT  DOCUMENTATION CODES Per approved criteria  -Non-severe (moderate) malnutrition in the context of chronic illness   Pt meets criteria for moderate MALNUTRITION in the context of chronic illness as evidenced by mild-moderate depletion of muscle mass with 12% weight loss within 6 months.  INTERVENTION:  Boost Plus PO BID, each supplement provides 360 kcals, 14 gm protein.  NUTRITION DIAGNOSIS: Malnutrition related to inadequate oral intake as evidenced by mild-moderate depletion of muscle mass with 12% weight loss within 6 months.   Goal: Intake to meet >90% of estimated nutrition needs.  Monitor:  PO intake, labs, weight trend.  Reason for Assessment: Malnutrition Screening Tool  81 y.o. female  Admitting Dx: Acute renal failure (ARF)  ASSESSMENT: Patient admitted on 1/10 with 3-4 days of poor intake and progressive malaise and weakness. She had severe bradycardia and acute renal failure in the ED.  Patient reports that she has been eating poorly, the food does not appeal to her. She has had a poor intake and has been eating ~half of what she usually eats since surgery in July 2015. She likes chocolate Boost supplements.  Nutrition Focused Physical Exam:  Subcutaneous Fat:  Orbital Region: WNL Upper Arm Region: WNL Thoracic and Lumbar Region: NA  Muscle:  Temple Region: mild depletion Clavicle Bone Region: mild-moderate depletion Clavicle and Acromion Bone Region: mild-moderate depletion Scapular Bone Region: NA Dorsal Hand: mild-moderate depletion Patellar Region: WNL Anterior Thigh Region: WNL Posterior Calf Region: moderate depletion  Edema: none   Height: Ht Readings from Last 1 Encounters:  03/22/14 5\' 2"  (1.575 m)    Weight: Wt Readings from Last 1 Encounters:  03/22/14 140 lb 9.6 oz (63.776 kg)    Ideal Body Weight: 50 kg  % Ideal Body Weight: 128%  Wt Readings from Last 10 Encounters:  03/22/14 140 lb 9.6 oz  (63.776 kg)  01/05/14 147 lb (66.679 kg)  11/25/13 147 lb (66.679 kg)  11/24/13 148 lb (67.132 kg)  11/20/13 148 lb (67.132 kg)  11/12/13 150 lb 6.4 oz (68.221 kg)  10/22/13 171 lb (77.565 kg)  10/21/13 158 lb 11.7 oz (72 kg)  10/05/13 154 lb (69.854 kg)    Usual Body Weight: 160 lb last July per patient  % Usual Body Weight: 88%  BMI:  Body mass index is 25.71 kg/(m^2).  Estimated Nutritional Needs: Kcal: 1400-1600 Protein: 75-90 gm Fluid: 1.5 L  Skin: stage 1 pressure ulcer to sacrum  Diet Order: Diet heart healthy/carb modified  EDUCATION NEEDS: -Education needs addressed   Intake/Output Summary (Last 24 hours) at 03/22/14 1426 Last data filed at 03/22/14 0800  Gross per 24 hour  Intake    350 ml  Output      0 ml  Net    350 ml    Last BM: 1/11   Labs:   Recent Labs Lab 03/21/14 1045 03/21/14 1631 03/22/14 0457  NA 129* 133* 130*  K 5.8* 5.1 5.4*  CL 88* 95* 93*  CO2 14* 13* 11*  BUN 65* 62* 74*  CREATININE 4.21* 3.84* 4.40*  CALCIUM 8.7 8.5 8.5  GLUCOSE 85 104* 92    CBG (last 3)   Recent Labs  03/21/14 2005  GLUCAP 83    Scheduled Meds: . aspirin EC  325 mg Oral Daily  . atorvastatin  80 mg Oral Daily  . feeding supplement (ENSURE COMPLETE)  237 mL Oral BID BM  . heparin  5,000 Units Subcutaneous 3 times per day  . levothyroxine  112 mcg Oral QAC breakfast  . pantoprazole  40 mg Oral Daily    Continuous Infusions: . sodium chloride 10 mL (03/21/14 1917)  .  sodium bicarbonate 150 mEq in sterile water 1000 mL infusion 100 mL/hr at 03/22/14 1311    Past Medical History  Diagnosis Date  . DM2 (diabetes mellitus, type 2)   . Hypertension   . Hyperlipidemia   . GERD (gastroesophageal reflux disease)   . Osteopenia   . CAD (coronary artery disease), native coronary artery 10/06/2013    LAD 90%, CFX 90%, RCA 100% w/ collat  . Ischemic dilated cardiomyopathy 09/2013    EF 20-25% by echo  . Chronic combined systolic and diastolic  CHF, NYHA class 2 09/2013  . NSVT (nonsustained ventricular tachycardia) 09/2013  . OAB (overactive bladder)   . Hypothyroidism   . CVA (cerebral vascular accident) 1968    leaving no deficit  . Arthritis     "knees" (10/08/2013)  . Breast cancer     "left; I had 62 sessions of radiation"    Past Surgical History  Procedure Laterality Date  . Colonoscopy  2005  . Cardiac catheterization  10/06/2013  . Cataract extraction w/ intraocular lens  implant, bilateral Bilateral ~ 2009  . Tonsillectomy and adenoidectomy  1930's  . Breast lumpectomy Left 1998  . Breast lumpectomy with axillary lymph node dissection Left 1998  . Coronary artery bypass graft [name] 10/09/2013    Procedure: CORONARY ARTERY BYPASS GRAFTING (CABG) x 4 using left internal mammary artery and right saphenous leg vein using endoscope.;  Surgeon: Melrose Nakayama, MD;  Location: [facility];  Service: Open Heart Surgery;  Laterality: [name];  . Intraoperative transesophageal echocardiogram [name] 10/09/2013    Procedure: INTRAOPERATIVE TRANSESOPHAGEAL ECHOCARDIOGRAM;  Surgeon: Melrose Nakayama, MD;  Location: [facility];  Service: Open Heart Surgery;  Laterality: [name];  . Left and right heart catheterization with coronary angiogram [name] 10/06/2013    Procedure: LEFT AND RIGHT HEART CATHETERIZATION WITH CORONARY ANGIOGRAM;  Surgeon: Sanda Klein, MD;  Location: [facility] CATH [facility];  Service: Cardiovascular;  Laterality: [name];    Molli Barrows, RD, LDN, [place] Pager [phone] After Hours Pager [phone]

## 2014-03-22 NOTE — Progress Notes (Signed)
Subjective: No chest pain or SOB, still weak.   Objective: Vital signs in last 24 hours: Temp:  [97.3 F (36.3 C)-97.7 F (36.5 C)] 97.7 F (36.5 C) (01/11 0713) Pulse Rate:  [36-95] 45 (01/11 0713) Resp:  [12-21] 17 (01/11 0713) BP: (85-156)/(25-58) 97/55 mmHg (01/11 0713) SpO2:  [94 %-100 %] 97 % (01/11 0713) Weight:  [135 lb (61.236 kg)-140 lb 9.6 oz (63.776 kg)] 140 lb 9.6 oz (63.776 kg) (01/11 0337) Weight change:    Intake/Output from previous day: +2170  01/10 0701 - 01/11 0700 In: 2350 [I.V.:2300; IV Piggyback:50] Out: 180 [Urine:180] Intake/Output this shift: Total I/O In: 250 [P.O.:120; I.V.:80; IV Piggyback:50] Out: -   PE: General:Pleasant affect, NAD Skin:Warm and dry, brisk capillary refill HEENT:normocephalic, sclera clear, mucus membranes moist Heart:S1S2 RRR with 0-2/7 systolic murmur, no gallup, rub or click Lungs:diminished with crackles, no rhonchi, or wheezes OZD:GUYQ, non tender, + BS, do not palpate liver spleen or masses Ext:no lower ext edema, 2+ pedal pulses, 2+ radial pulses Neuro:alert and oriented, MAE, follows commands, + facial symmetry TELE:  SB in the 40s and some junctional rhythm.  Lowest HR upper 30s.   Lab Results:  Recent Labs  03/21/14 1045 03/22/14 0457  WBC 12.4* 16.5*  HGB 10.5* 10.8*  HCT 32.6* 33.9*  PLT 340 338   BMET  Recent Labs  03/21/14 1631 03/22/14 0457  NA 133* 130*  K 5.1 5.4*  CL 95* 93*  CO2 13* 11*  GLUCOSE 104* 92  BUN 62* 74*  CREATININE 3.84* 4.40*  CALCIUM 8.5 8.5    Recent Labs  03/21/14 2105 03/22/14 0457  TROPONINI 0.49* 0.48*    Lab Results  Component Value Date   CHOL 148 10/07/2013   HDL 40 10/07/2013   LDLCALC 86 10/07/2013   TRIG 111 10/07/2013   CHOLHDL 3.7 10/07/2013   Lab Results  Component Value Date   HGBA1C 6.8* 10/07/2013     No results found for: TSH  Hepatic Function Panel  Recent Labs  03/21/14 1045  PROT 4.9*  ALBUMIN 2.6*  AST 54*    ALT 30  ALKPHOS 37*  BILITOT 0.7   No results for input(s): CHOL in the last 72 hours. No results for input(s): PROTIME in the last 72 hours.     Studies/Results: Dg Chest Port 1 View  03/21/2014   CLINICAL DATA:  Weakness  EXAM: PORTABLE CHEST - 1 VIEW  COMPARISON:  01/05/2014  FINDINGS: Chronic interstitial markings. No focal consolidation. No pleural effusion or pneumothorax.  The heart is normal in size. Postsurgical changes related to prior CABG.  IMPRESSION: No evidence of acute cardiopulmonary disease.   Electronically Signed   By: Julian Hy M.D.   On: 03/21/2014 13:38   2D Echo 03/22/14: Left ventricle: Inferobasal hypokinesis The cavity size was normal. Wall thickness was increased in a pattern of moderate LVH. The estimated ejection fraction was 55%. - Aortic valve: small systolic gradient with no significant stenosis - Mitral valve: There was mild regurgitation. - Right ventricle: The cavity size was mildly dilated. - Atrial septum: No defect or patent foramen ovale was identified. - Pulmonary arteries: PA peak pressure: 46 mm Hg (S).   Medications: I have reviewed the patient's current medications. Scheduled Meds: . aspirin EC  325 mg Oral Daily  . atorvastatin  80 mg Oral Daily  . feeding supplement (ENSURE COMPLETE)  237 mL Oral BID BM  . heparin  5,000 Units  Subcutaneous 3 times per day  . levothyroxine  112 mcg Oral QAC breakfast  . pantoprazole  40 mg Oral Daily  . piperacillin-tazobactam (ZOSYN)  IV  2.25 g Intravenous Q8H   Continuous Infusions: . sodium chloride 10 mL (03/21/14 1917)  .  sodium bicarbonate 150 mEq in sterile water 1000 mL infusion     PRN Meds:.acetaminophen, ondansetron  Assessment/Plan: 81 y.o. female with a past medical history significant for diabetes, HTN, nonsmoker with history of left breast cancer treated with radiation-lumpectomy 15 years ago. She was seen for CHF 10/05/13. Her echo showed an EF of 20-25%. She was  admitted for outpatient right and left heart catheterization for symptoms of class III CHF on 10/06/13. This revealed severe 3V CAD and she underwent CABG x 4 10/09/13. Post op she had PAF Rx'd with Amiodarone. She was placed in a skilled nursing facility for rehabilitation but gradually improved. She was seen in the office on 11/17/2013 and was doing well at that time. She was subsequently seen in follow-up by Dr. Roxan Hockey, who performed her CABG. She was doing very well to time.  Presented to the emergency room with weakness after a fall and was down in floor for 14 hours Wed to Thurs this past week when she came to ER on Sunday for continued weakness, she was found to be hypotensive and bradycardic. CBC indicates a leukocytosis with a white blood cell count of 12,400 and CMET is markedly abnormal with sodium 129, potassium 5.8, chloride 88, CO2 14 creatinine of 4.21. Baseline creatinine is 0.6. Troponin was elevated 0.81 now to 0.48 and H&H is 10 and 33. EKG shows marked sinus bradycardia in the 30's.  Principal Problem:   Acute renal failure (ARF)  Active Problems:   Chronic systolic heart failure   HTN (hypertension)   Hyperlipidemia   Diabetes mellitus type 2, controlled   CAD (coronary artery disease), native coronary artery   Ischemic dilated cardiomyopathy   S/P CABG x 4   GERD (gastroesophageal reflux disease)   Hypotension improved but still soft   Bradycardia  Continues to be slow off of coreg, Hx of bradycardia post CABG, but improved.   Shock circulatory   Acute renal failure   Elevated troponin- could be related to acute renal failure and demand ischemia with recent        LOS: 1 day   Time spent with pt. :15 minutes. [facility] R  Nurse Practitioner Certified Pager [phone] or after 5pm and on weekends call [phone] 03/22/2014, 11:21 AM

## 2014-03-22 NOTE — Consult Note (Signed)
Referring Provider: No ref. provider found Primary Care Physician:  Thressa Sheller, MD Primary Nephrologist:    Reason for Consultation:  Acute kidney injury , hyperkalemia and anemia  HPI: S/P CABG 09/2013      Has baseline normal renal function. She fell about 2 weeks ago although does not believe that this was due to loss of consciousness. She has been not eating for about 4-5 days.   On presentation to the ER she was hypotensive and bradycardic. CBC indicates a leukocytosis with a white blood cell count of 12,400 and CMET is markedly abnormal with sodium 129, potassium 5.8, chloride 88, CO2 14 creatinine of 4.21.  Her home medications including lisinopril/HCTZ  and metformin   Past Medical History  Diagnosis Date  . DM2 (diabetes mellitus, type 2)   . Hypertension   . Hyperlipidemia   . GERD (gastroesophageal reflux disease)   . Osteopenia   . CAD (coronary artery disease), native coronary artery 10/06/2013    LAD 90%, CFX 90%, RCA 100% w/ collat  . Ischemic dilated cardiomyopathy 09/2013    EF 20-25% by echo  . Chronic combined systolic and diastolic CHF, NYHA class 2 09/2013  . NSVT (nonsustained ventricular tachycardia) 09/2013  . OAB (overactive bladder)   . Hypothyroidism   . CVA (cerebral vascular accident) 1968    leaving no deficit  . Arthritis     "knees" (10/08/2013)  . Breast cancer     "left; I had 62 sessions of radiation"    Past Surgical History  Procedure Laterality Date  . Colonoscopy  2005  . Cardiac catheterization  10/06/2013  . Cataract extraction w/ intraocular lens  implant, bilateral Bilateral ~ 2009  . Tonsillectomy and adenoidectomy  1930's  . Breast lumpectomy Left 1998  . Breast lumpectomy with axillary lymph node dissection Left 1998  . Coronary artery bypass graft [name] 10/09/2013    Procedure: CORONARY ARTERY BYPASS GRAFTING (CABG) x 4 using left internal mammary artery and right saphenous leg vein using endoscope.;  Surgeon: Melrose Nakayama, MD;  Location: [place];  Service: Open Heart Surgery;  Laterality: [name];  . Intraoperative transesophageal echocardiogram [name] 10/09/2013    Procedure: INTRAOPERATIVE TRANSESOPHAGEAL ECHOCARDIOGRAM;  Surgeon: Melrose Nakayama, MD;  Location: [place];  Service: Open Heart Surgery;  Laterality: [name];  . Left and right heart catheterization with coronary angiogram [name] 10/06/2013    Procedure: LEFT AND RIGHT HEART CATHETERIZATION WITH CORONARY ANGIOGRAM;  Surgeon: Sanda Klein, MD;  Location: [facility];  Service: Cardiovascular;  Laterality: [name];    Prior to Admission medications   Medication Sig Start Date End Date Taking? Authorizing Provider  acetaminophen (TYLENOL) 500 MG chewable tablet Chew 1,000 mg by mouth 2 (two) times daily.   Yes Historical Provider, MD  amiodarone (PACERONE) 200 MG tablet TAKE 1 TABLET ONCE DAILY. 12/22/13  Yes Mihai Croitoru, MD  aspirin EC 325 MG EC tablet Take 1 tablet (325 mg total) by mouth daily. 10/21/13  Yes Wayne E Gold, PA-C  atorvastatin (LIPITOR) 80 MG tablet Take 1 tablet by mouth every evening.  10/04/13  Yes Historical Provider, MD  carvedilol (COREG) 6.25 MG tablet TAKE 1 TABLET TWICE DAILY. 12/22/13  Yes Mihai Croitoru, MD  cholecalciferol (VITAMIN D) 1000 UNITS tablet Take 1,000 Units by mouth daily.   Yes Historical Provider, MD  feeding supplement, GLUCERNA SHAKE, (GLUCERNA SHAKE) LIQD Take 237 mLs by mouth 2 (two) times daily between meals. 10/21/13  Yes Wayne E Gold, PA-C  ferrous sulfate  325 (65 FE) MG tablet Take 1 tablet (325 mg total) by mouth daily with breakfast. 10/21/13  Yes Wayne E Gold, PA-C  levothyroxine (SYNTHROID, LEVOTHROID) 112 MCG tablet Take 1 tablet by mouth daily. 09/24/13  Yes Historical Provider, MD  lisinopril-hydrochlorothiazide (PRINZIDE,ZESTORETIC) 20-12.5 MG per tablet Take 1 tablet by mouth 2 (two) times daily.  09/22/13  Yes Historical Provider, MD  Magnesium Oxide 400 (240 MG) MG TABS TAKE 1 TABLET TWICE DAILY.  12/22/13  Yes Mihai Croitoru, MD  metFORMIN (GLUCOPHAGE) 1000 MG tablet Take 1 tablet by mouth 2 (two) times daily. 09/11/13  Yes Historical Provider, MD  oxybutynin (DITROPAN XL) 15 MG 24 hr tablet Take 2 tablets by mouth at bedtime.  09/08/13  Yes Historical Provider, MD  pantoprazole (PROTONIX) 40 MG tablet Take 1 tablet by mouth every evening.  10/04/13  Yes Historical Provider, MD  VOLTAREN 1 % GEL Apply 1 application topically daily as needed (knee pain).  09/02/13  Yes Historical Provider, MD    Current Facility-Administered Medications  Medication Dose Route Frequency Provider Last Rate Last Dose  . 0.9 %  sodium chloride infusion   Intravenous Continuous Mirna Mires, MD 10 mL/hr at 03/21/14 1917 10 mL at 03/21/14 1917  . acetaminophen (TYLENOL) tablet 650 mg  650 mg Oral Q6H PRN Chesley Mires, MD   650 mg at 03/22/14 0717  . aspirin EC tablet 325 mg  325 mg Oral Daily Collene Gobble, MD   325 mg at 03/22/14 1033  . atorvastatin (LIPITOR) tablet 80 mg  80 mg Oral Daily Collene Gobble, MD   80 mg at 03/22/14 1033  . feeding supplement (ENSURE COMPLETE) (ENSURE COMPLETE) liquid 237 mL  237 mL Oral BID BM Collene Gobble, MD   237 mL at 03/22/14 1311  . heparin injection 5,000 Units  5,000 Units Subcutaneous 3 times per day Collene Gobble, MD   5,000 Units at 03/22/14 1311  . levothyroxine (SYNTHROID, LEVOTHROID) tablet 112 mcg  112 mcg Oral QAC breakfast Collene Gobble, MD   112 mcg at 03/22/14 0721  . ondansetron (ZOFRAN) injection 4 mg  4 mg Intravenous Q6H PRN Chesley Mires, MD   4 mg at 03/21/14 1916  . pantoprazole (PROTONIX) EC tablet 40 mg  40 mg Oral Daily Collene Gobble, MD   40 mg at 03/22/14 1033  . sodium bicarbonate 150 mEq in sterile water 1,000 mL infusion   Intravenous Continuous Sherril Croon, MD 100 mL/hr at 03/22/14 1311      Allergies as of 03/21/2014  . (No Known Allergies)    History reviewed. No pertinent family history.  History   Social History  . Marital Status:  Widowed    Spouse Name: [name]    Number of Children: [name]  . Years of Education: [name]   Occupational History  . Not on file.   Social History Main Topics  . Smoking status: Former Smoker    Types: Cigarettes  . Smokeless tobacco: Never Used     Comment: "smoked in college; not much; not long"  . Alcohol Use: No  . Drug Use: No  . Sexual Activity: Not Currently   Other Topics Concern  . Not on file   Social History Narrative    Review of Systems: Gen:+ anorexia, fatigue, weakness, malaise, HEENT: No visual complaints, No history of Retinopathy. Normal external appearance No Epistaxis or Sore throat. No sinusitis.   CV: S/P CABG  Post op Atrial fibrillation  Resp: Denies dyspnea at rest, dyspnea with exercise, cough, sputum, wheezing, coughing up blood, and pleurisy. GI: Denies vomiting blood, jaundice, and fecal incontinence.   Denies dysphagia or odynophagia. GU : Denies urinary burning, blood in urine, urinary frequency, urinary hesitancy, nocturnal urination, and urinary incontinence.  No renal calculi. MS:  Weakness  Derm: Denies rash, itching, dry skin, hives, moles, warts, or unhealing ulcers.  Psych: Denies depression, anxiety, memory loss, suicidal ideation, hallucinations, paranoia, and confusion. Heme: Denies bruising, bleeding, and enlarged lymph nodes. Neuro: No headache.  No diplopia. No dysarthria.  No dysphasia.  No history of CVA.  No Seizures. No paresthesias.  No weakness. Endocrine  DM.  No Thyroid disease.  No Adrenal disease.  Physical Exam: Vital signs in last 24 hours: Temp:  [97.3 F (36.3 C)-97.7 F (36.5 C)] 97.6 F (36.4 C) (01/11 1300) Pulse Rate:  [40-95] 45 (01/11 0713) Resp:  [12-21] 17 (01/11 0713) BP: (97-156)/(27-58) 97/55 mmHg (01/11 0713) SpO2:  [94 %-100 %] 97 % (01/11 0713) Weight:  [63.7 kg (140 lb 6.9 oz)-63.776 kg (140 lb 9.6 oz)] 63.776 kg (140 lb 9.6 oz) (01/11 7510)   General:   Alert,  Well-developed, well-nourished, pleasant  and cooperative in NAD Head:  Normocephalic and atraumatic. Eyes:  Sclera clear, no icterus.   Conjunctiva pink. Ears:  Normal auditory acuity. Nose:  No deformity, discharge,  or lesions. Mouth:  No deformity or lesions, dentition normal. Neck:  Supple; no masses or thyromegaly. JVP not elevated Lungs:  Clear throughout to auscultation.   No wheezes, crackles, or rhonchi. No acute distress. Heart:  Regular rate and rhythm; no murmurs, clicks, rubs,  or gallops. Abdomen:  Soft, nontender and nondistended. No masses, hepatosplenomegaly or hernias noted. Normal bowel sounds, without guarding, and without rebound.   Msk:  Symmetrical without gross deformities. Normal posture. Pulses:  No carotid, renal, femoral bruits. DP and PT symmetrical and equal Extremities:  Without clubbing or edema. Neurologic:  Alert and  oriented x4;  grossly normal neurologically. Skin:  Intact without significant lesions or rashes. Cervical Nodes:  No significant cervical adenopathy. Psych:  Alert and cooperative. Normal mood and affect.  Intake/Output from previous day: 01/10 0701 - 01/11 0700 In: 2350 [I.V.:2300; IV Piggyback:50] Out: 180 [Urine:180] Intake/Output this shift: Total I/O In: 250 [P.O.:120; I.V.:80; IV Piggyback:50] Out: -   Lab Results:  Recent Labs  03/21/14 1045 03/22/14 0457  WBC 12.4* 16.5*  HGB 10.5* 10.8*  HCT 32.6* 33.9*  PLT 340 338   BMET  Recent Labs  03/21/14 1045 03/21/14 1631 03/22/14 0457  NA 129* 133* 130*  K 5.8* 5.1 5.4*  CL 88* 95* 93*  CO2 14* 13* 11*  GLUCOSE 85 104* 92  BUN 65* 62* 74*  CREATININE 4.21* 3.84* 4.40*  CALCIUM 8.7 8.5 8.5   LFT  Recent Labs  03/21/14 1045  PROT 4.9*  ALBUMIN 2.6*  AST 54*  ALT 30  ALKPHOS 37*  BILITOT 0.7   PT/INR No results for input(s): LABPROT, INR in the last 72 hours. Hepatitis Panel No results for input(s): HEPBSAG, HCVAB, HEPAIGM, HEPBIGM in the last 72 hours.  Studies/Results: Dg Chest Port 1  View  03/21/2014   CLINICAL DATA:  Weakness  EXAM: PORTABLE CHEST - 1 VIEW  COMPARISON:  01/05/2014  FINDINGS: Chronic interstitial markings. No focal consolidation. No pleural effusion or pneumothorax.  The heart is normal in size. Postsurgical changes related to prior CABG.  IMPRESSION: No evidence of acute cardiopulmonary disease.  Electronically Signed   By: Julian Hy M.D.   On: 03/21/2014 13:38    Assessment/Plan:  Acute kidney injury  Most likely represents ischemic ATN. This would be most consistent with the low blood pressure and bradycardia. She also was taking lisinopril that could also have caused acute kidney injury although it is unclear if this had been stopped earlier.  Hyperkalemia  Doubt that this is causing the bradycardia  There are no EKG changes of hyperkalemia no T wave peaking and no widening of QRS  Metabolic acidosis secondary to 1  Also was using metformin   We shall start bicarbonate drip  Hyponatremia secondary to 1  Hypotension agree with stopping all cardiac meds for now  LOS: 1 Kymber Kosar W @TODAY @1 :14 PM

## 2014-03-22 NOTE — Progress Notes (Signed)
  Echocardiogram 2D Echocardiogram has been performed.  Diamond Nickel 03/22/2014, 9:57 AM

## 2014-03-22 NOTE — Progress Notes (Signed)
Utilization review completed. Meer Reindl, RN, BSN. 

## 2014-03-22 NOTE — Progress Notes (Signed)
PULMONARY / CRITICAL CARE MEDICINE   Name: Marissa Jefferson MRN: [id number] DOB: [birth year]    ADMISSION DATE:  03/21/2014  CHIEF COMPLAINT:  Lethargy and weakness  INITIAL PRESENTATION: 81 year old woman history of CAD/CABG, ischemic cardiomyopathy, postop A. Fib, diabetes, hypertension, presenting with 3-4 days of poor intake and progressive malaise and weakness. In the ED she was severely bradycardic (sinus) and hypotensive. Labs identified acute renal failure with associated hyperkalemia and metabolic acidosis. She is admitted for further care 1/10. She states that her CODE STATUS should be DO NOT RESUSCITATE and that she is not interested in long term hemodialysis.   STUDIES:  TTE 1/10 >>   SIGNIFICANT EVENTS: CABG x 4 09/2013 (Dr Roxan Hockey)  SUBJECTIVE: Still feels very weak.  Had a rough night after kayexalate.  Potassium elevated again this AM at 5.4.  Remains bradycardic in 40's.  VITAL SIGNS: Temp:  [96.5 F (35.8 C)-97.7 F (36.5 C)] 97.7 F (36.5 C) (01/11 0713) Pulse Rate:  [36-95] 45 (01/11 0713) Resp:  [12-21] 17 (01/11 0713) BP: (85-156)/(25-58) 97/55 mmHg (01/11 0713) SpO2:  [94 %-100 %] 97 % (01/11 0713) Weight:  [61.236 kg (135 lb)-63.776 kg (140 lb 9.6 oz)] 63.776 kg (140 lb 9.6 oz) (01/11 0337) HEMODYNAMICS:   VENTILATOR SETTINGS:   INTAKE / OUTPUT:  Intake/Output Summary (Last 24 hours) at 03/22/14 0954 Last data filed at 03/22/14 0800  Gross per 24 hour  Intake   2600 ml  Output    180 ml  Net   2420 ml    PHYSICAL EXAMINATION: General:  Frail, elderly, chronically ill-appearing woman Neuro:  Very sleepy but better than on presentation 1/10. Awakens to voice.  A&O x 3. HEENT:  MM dry, PERRL. Cardiovascular:  Bradycardic and regular, no M/R/G. Lungs:  Resps even and unlabored, CTA. Abdomen:  BS x 4, soft, NT/ND. Musculoskeletal:  No deformities Skin:  No rash  LABS:  CBC  Recent Labs Lab 03/21/14 1045 03/22/14 0457  WBC 12.4* 16.5*  HGB  10.5* 10.8*  HCT 32.6* 33.9*  PLT 340 338   Coag's No results for input(s): APTT, INR in the last 168 hours. BMET  Recent Labs Lab 03/21/14 1045 03/21/14 1631 03/22/14 0457  NA 129* 133* 130*  K 5.8* 5.1 5.4*  CL 88* 95* 93*  CO2 14* 13* 11*  BUN 65* 62* 74*  CREATININE 4.21* 3.84* 4.40*  GLUCOSE 85 104* 92   Electrolytes  Recent Labs Lab 03/21/14 1045 03/21/14 1631 03/22/14 0457  CALCIUM 8.7 8.5 8.5   Sepsis Markers  Recent Labs Lab 03/21/14 1240 03/21/14 1631  LATICACIDVEN 11.2*  --   PROCALCITON  --  0.14   ABG No results for input(s): PHART, PCO2ART, PO2ART in the last 168 hours. Liver Enzymes  Recent Labs Lab 03/21/14 1045  AST 54*  ALT 30  ALKPHOS 37*  BILITOT 0.7  ALBUMIN 2.6*   Cardiac Enzymes  Recent Labs Lab 03/21/14 1631 03/21/14 2105 03/22/14 0457  TROPONINI 0.51* 0.49* 0.48*   Glucose  Recent Labs Lab 03/21/14 2005  GLUCAP 83    Imaging Dg Chest Port 1 View  03/21/2014   CLINICAL DATA:  Weakness  EXAM: PORTABLE CHEST - 1 VIEW  COMPARISON:  01/05/2014  FINDINGS: Chronic interstitial markings. No focal consolidation. No pleural effusion or pneumothorax.  The heart is normal in size. Postsurgical changes related to prior CABG.  IMPRESSION: No evidence of acute cardiopulmonary disease.   Electronically Signed   By: Julian Hy M.D.   On:  03/21/2014 13:38     ASSESSMENT / PLAN:  PULMONARY A: hypoxemia in the setting of shock P:   O2 and supportive care.  CARDIOVASCULAR A:  Severe sinus bradycardia Associated cardiogenic shock with probable superimposed hypovolemic shock Troponin leak - likely demand ischemia History of atrial fibrillation History coronary artery disease and CABG History of ischemic cardiomyopathy P:  In absence of other EKG changes suspect that her sinus bradycardia is due to severe hyperkalemia and renal failure. We will address her underlying renal failure and correct electrolytes as  able. Repeat troponin. Hold amiodarone, lisinopril and carvedilol. Appreciate Dr. Lysbeth Penner assistance - may be a candidate for short-term dopamine while we attempt to rehydrate and correct electrolytes. Her systolic pressure has improved with volume and at this time I believe we can defer the dopamine.   RENAL A:   Acute renal failure, suspect ATN due to hypovolemia/dehydration Hyperkalemia - s/p kayexalate overnight Hyponatremia AG lactic acidosis Probable rhabdo - family reports that pt had fall last week Wednesday and was down for ~ 14 hours. P:   Will hold off on additional fluid resuscitation for now given that pt has had minimal UOP and is now +2.5L. Repeat kayexalate this AM. Renal called - Dr. Justin Mend will see this AM. (pt and family OK with short term dialysis) Repeat BMP this PM. Trend lactate. Check CK. Consider renal ultrasound.  GASTROINTESTINAL A:   Poor PO intake GERD P:   Push PO as able. Protonix.  HEMATOLOGIC A:  Anemia VTE prophylaxis P:  Transfuse per usual guidelines. SCD's / heparin.  INFECTIOUS A:   Isolated leukocytosis without clear evidence for infection - likely acute phase reactant P:   BCx2 1/10 >>  UC 1/10 >>  Received vanco + zosyn x1 in ED. She denies any infectious prodrome or sx. Will check cx's and hold off on abx for now.   ENDOCRINE A:  DM Hypothyroidism P:   Hold metformin. Hold SSI for now given poor PO intake. Continue home Synthroid  NEUROLOGIC A:   Lethargy and weakness. These appear to be toxic metabolic P:   Supportive care and rehydration. Avoid any sedating medications.   FAMILY  - Updates: Updated pt's son and daughter.  Pt and family OK with short term dialysis.  Nephrology has been called.  - Inter-disciplinary family meet or Palliative Care meeting due by: 03/28/14   Montey Hora, [facility] Pgr: [phone]  or [phone] 03/22/2014, 10:10  AM  Attending Note:  I have examined patient, reviewed labs, studies and notes. I have discussed the case with Junius Roads, and I agree with the data and plans as amended above.   Baltazar Apo, MD, PhD 03/22/2014, 12:44 PM [facility] [phone] or if no answer [phone]

## 2014-03-23 LAB — BASIC METABOLIC PANEL
BUN: 79 mg/dL — ABNORMAL HIGH (ref 6–23)
CO2: 18 mmol/L — ABNORMAL LOW (ref 19–32)
Calcium: 8.2 mg/dL — ABNORMAL LOW (ref 8.4–10.5)
Chloride: 90 mEq/L — ABNORMAL LOW (ref 96–112)
Creatinine, Ser: 4.43 mg/dL — ABNORMAL HIGH (ref 0.50–1.10)
GFR calc Af Amer: 9 mL/min — ABNORMAL LOW (ref >90)
GFR, EST NON AFRICAN AMERICAN: 8 mL/min — AB (ref >90)
GLUCOSE: 139 mg/dL — AB (ref 70–99)
Potassium: 4.2 mmol/L (ref 3.5–5.1)
SODIUM: 134 mmol/L — AB (ref 135–145)

## 2014-03-23 MED ORDER — CEFTRIAXONE SODIUM IN DEXTROSE 20 MG/ML IV SOLN
1.0000 g | INTRAVENOUS | Status: DC
  Administered 2014-03-23 – 2014-03-27 (×5): 1 g via INTRAVENOUS
  Filled 2014-03-23 (×8): qty 50

## 2014-03-23 NOTE — Progress Notes (Signed)
[facility] ROUNDING NOTE   Subjective:   Interval History: improved significantly  Objective:  Vital signs in last 24 hours:  Temp:  [97.4 F (36.3 C)-99.3 F (37.4 C)] 99.3 F (37.4 C) (01/12 1100) Pulse Rate:  [51-81] 81 (01/12 1122) Resp:  [10-18] 15 (01/12 1122) BP: (108-133)/(29-50) 108/43 mmHg (01/12 1122) SpO2:  [96 %-100 %] 99 % (01/12 1122) Weight:  [63.8 kg (140 lb 10.5 oz)] 63.8 kg (140 lb 10.5 oz) (01/12 0500)  Weight change: 2.565 kg (5 lb 10.5 oz) Filed Weights   03/21/14 1905 03/22/14 0337 03/23/14 0500  Weight: 63.7 kg (140 lb 6.9 oz) 63.776 kg (140 lb 9.6 oz) 63.8 kg (140 lb 10.5 oz)    Intake/Output: I/O last 3 completed shifts: In: 2263.5 [P.O.:300; I.V.:1863.5; IV Piggyback:100] Out: 350 [Urine:350]   Intake/Output this shift:  Total I/O In: 1070 [P.O.:120; I.V.:900; IV Piggyback:50] Out: 500 [Urine:500]  CVS- RRR RS- CTA ABD- BS present soft non-distended EXT- no edema   Basic Metabolic Panel:  Recent Labs Lab 03/21/14 1045 03/21/14 1631 03/22/14 0457 03/22/14 2030 03/23/14 0311  NA 129* 133* 130* 133* 134*  K 5.8* 5.1 5.4* 5.1 4.2  CL 88* 95* 93* 91* 90*  CO2 14* 13* 11* 14* 18*  GLUCOSE 85 104* 92 135* 139*  BUN 65* 62* 74* 83* 79*  CREATININE 4.21* 3.84* 4.40* 4.62* 4.43*  CALCIUM 8.7 8.5 8.5 8.5 8.2*    Liver Function Tests:  Recent Labs Lab 03/21/14 1045  AST 54*  ALT 30  ALKPHOS 37*  BILITOT 0.7  PROT 4.9*  ALBUMIN 2.6*   No results for input(s): LIPASE, AMYLASE in the last 168 hours. No results for input(s): AMMONIA in the last 168 hours.  CBC:  Recent Labs Lab 03/21/14 1045 03/22/14 0457  WBC 12.4* 16.5*  HGB 10.5* 10.8*  HCT 32.6* 33.9*  MCV 101.6* 103.4*  PLT 340 338    Cardiac Enzymes:  Recent Labs Lab 03/21/14 1045 03/21/14 1631 03/21/14 2105 03/22/14 0457 03/22/14 1055  CKTOTAL  --   --   --   --  100  TROPONINI 0.81* 0.51* 0.49* 0.48* 0.48*    BNP: Invalid input(s):  POCBNP  CBG:  Recent Labs Lab 03/21/14 2005  GLUCAP 83    Microbiology: Results for orders placed or performed during the hospital encounter of 03/21/14  Blood culture (routine x 2)     Status: None (Preliminary result)   Collection Time: 03/21/14 12:40 PM  Result Value Ref Range Status   Specimen Description BLOOD RIGHT ARM  Final   Special Requests BOTTLES DRAWN AEROBIC AND ANAEROBIC 10 CC  Final   Culture   Final           BLOOD CULTURE RECEIVED NO GROWTH TO DATE CULTURE WILL BE HELD FOR 5 DAYS BEFORE ISSUING A FINAL NEGATIVE REPORT Performed at [facility]    Report Status PENDING  Incomplete  Blood culture (routine x 2)     Status: None (Preliminary result)   Collection Time: 03/21/14  1:00 PM  Result Value Ref Range Status   Specimen Description BLOOD RIGHT ARM  Final   Special Requests BOTTLES DRAWN AEROBIC AND ANAEROBIC 10 CC  Final   Culture   Final           BLOOD CULTURE RECEIVED NO GROWTH TO DATE CULTURE WILL BE HELD FOR 5 DAYS BEFORE ISSUING A FINAL NEGATIVE REPORT Performed at [facility]    Report Status PENDING  Incomplete  Urine culture     Status: None (Preliminary result)   Collection Time: 03/21/14  1:24 PM  Result Value Ref Range Status   Specimen Description URINE, CATHETERIZED  Final   Special Requests NONE  Final   Colony Count   Final    >=100,000 COLONIES/ML Performed at [facility]    Culture   Final    ESCHERICHIA COLI Performed at [facility]    Report Status PENDING  Incomplete  MRSA PCR Screening     Status: None   Collection Time: 03/21/14 10:57 PM  Result Value Ref Range Status   MRSA by PCR NEGATIVE NEGATIVE Final    Comment:        The GeneXpert MRSA Assay (FDA approved for NASAL specimens only), is one component of a comprehensive MRSA colonization surveillance program. It is not intended to diagnose MRSA infection nor to guide or monitor treatment for MRSA infections.      Coagulation Studies: No results for input(s): LABPROT, INR in the last 72 hours.  Urinalysis:  Recent Labs  03/21/14 1324 03/22/14 1509  COLORURINE AMBER* AMBER*  LABSPEC 1.022 1.024  PHURINE 5.0 5.0  GLUCOSEU NEGATIVE NEGATIVE  HGBUR NEGATIVE NEGATIVE  BILIRUBINUR SMALL* SMALL*  KETONESUR 15* 15*  PROTEINUR 30* 30*  UROBILINOGEN 0.2 0.2  NITRITE NEGATIVE NEGATIVE  LEUKOCYTESUR SMALL* SMALL*      Imaging: No results found.   Medications:   . sodium chloride Stopped (03/22/14 1311)  .  sodium bicarbonate 150 mEq in sterile water 1000 mL infusion 100 mL/hr at 03/23/14 1018   . aspirin EC  325 mg Oral Daily  . atorvastatin  80 mg Oral Daily  . cefTRIAXone (ROCEPHIN)  IV  1 g Intravenous Q24H  . heparin  5,000 Units Subcutaneous 3 times per day  . lactose free nutrition  237 mL Oral BID BM  . levothyroxine  112 mcg Oral QAC breakfast  . pantoprazole  40 mg Oral Daily   acetaminophen, ondansetron  Assessment/ Plan:   Acute kidney injury Most likely represents ischemic ATN. This would be most consistent with the low blood pressure and bradycardia. She also was taking lisinopril that could also have caused acute kidney injury although it is unclear if this had been stopped earlier. Renal function slowly resolving  Hyperkalemia Doubt that this is causing the bradycardia There are no EKG changes of hyperkalemia no T wave peaking and no widening of QRS  Now resolved  Metabolic acidosis secondary to 1 Also was using metformin We shall start bicarbonate drip  improving  Hyponatremia secondary to 1  Hypotension agree with stopping all cardiac medication  Hypocalcemia corrects for low albumin    LOS: 2 Marissa Jefferson W @TODAY @3 :11 PM

## 2014-03-23 NOTE — Progress Notes (Addendum)
PULMONARY / CRITICAL CARE MEDICINE   Name: Marissa Jefferson MRN: [id number] DOB: 1924-07-09    ADMISSION DATE:  03/21/2014  CHIEF COMPLAINT:  Lethargy and weakness  INITIAL PRESENTATION: 81 year old woman history of CAD/CABG, ischemic cardiomyopathy, postop A. Fib, diabetes, hypertension, presenting with 3-4 days of poor intake and progressive malaise and weakness. In the ED she was severely bradycardic (sinus) and hypotensive. Labs identified acute renal failure with associated hyperkalemia and metabolic acidosis. She is admitted for further care 1/10. She states that her CODE STATUS should be DO NOT RESUSCITATE and that she is not interested in long term hemodialysis.   STUDIES:  TTE 1/10 >>   SIGNIFICANT EVENTS: CABG x 4 09/2013 (Dr Roxan Hockey)  SUBJECTIVE:    VITAL SIGNS: Temp:  [97.4 F (36.3 C)-98.7 F (37.1 C)] 98.7 F (37.1 C) (01/12 0800) Pulse Rate:  [46-79] 79 (01/12 0716) Resp:  [10-18] 11 (01/12 0716) BP: (102-133)/(29-50) 112/50 mmHg (01/12 0716) SpO2:  [96 %-100 %] 100 % (01/12 0716) Weight:  [63.8 kg (140 lb 10.5 oz)] 63.8 kg (140 lb 10.5 oz) (01/12 0500) HEMODYNAMICS:   VENTILATOR SETTINGS:   INTAKE / OUTPUT:  Intake/Output Summary (Last 24 hours) at 03/23/14 1100 Last data filed at 03/23/14 1100  Gross per 24 hour  Intake 2533.5 ml  Output    525 ml  Net 2008.5 ml    PHYSICAL EXAMINATION: General:  Frail, elderly, chronically ill-appearing woman Neuro:  Very sleepy but better than on presentation 1/10. Awakens to voice.  A&O x 3. HEENT:  MM dry, PERRL. Cardiovascular:  Bradycardic and regular, no M/R/G. Lungs:  Resps even and unlabored, CTA. Abdomen:  BS x 4, soft, NT/ND. Musculoskeletal:  No deformities Skin:  No rash  LABS:  CBC  Recent Labs Lab 03/21/14 1045 03/22/14 0457  WBC 12.4* 16.5*  HGB 10.5* 10.8*  HCT 32.6* 33.9*  PLT 340 338   Coag's No results for input(s): APTT, INR in the last 168 hours. BMET  Recent Labs Lab  03/22/14 0457 03/22/14 2030 03/23/14 0311  NA 130* 133* 134*  K 5.4* 5.1 4.2  CL 93* 91* 90*  CO2 11* 14* 18*  BUN 74* 83* 79*  CREATININE 4.40* 4.62* 4.43*  GLUCOSE 92 135* 139*   Electrolytes  Recent Labs Lab 03/22/14 0457 03/22/14 2030 03/23/14 0311  CALCIUM 8.5 8.5 8.2*   Sepsis Markers  Recent Labs Lab 03/21/14 1240 03/21/14 1631 03/22/14 1055  LATICACIDVEN 11.2*  --  7.8*  PROCALCITON  --  0.14  --    ABG No results for input(s): PHART, PCO2ART, PO2ART in the last 168 hours. Liver Enzymes  Recent Labs Lab 03/21/14 1045  AST 54*  ALT 30  ALKPHOS 37*  BILITOT 0.7  ALBUMIN 2.6*   Cardiac Enzymes  Recent Labs Lab 03/21/14 2105 03/22/14 0457 03/22/14 1055  TROPONINI 0.49* 0.48* 0.48*   Glucose  Recent Labs Lab 03/21/14 2005  GLUCAP 83    Imaging No results found.   ASSESSMENT / PLAN:  PULMONARY A: hypoxemia in the setting of shock P:   O2 and supportive care.  CARDIOVASCULAR A:  Severe sinus bradycardia Associated cardiogenic shock with probable superimposed hypovolemic shock Troponin leak - likely demand ischemia History of atrial fibrillation History coronary artery disease and CABG History of ischemic cardiomyopathy P:  Follow telemetry, no further HR to 30's, BP stabilized Holding amiodarone, lisinopril and carvedilol.  RENAL A:   Acute renal failure, suspect ATN due to hypovolemia/dehydration; S Cr appears to have plateaued on  1/11 and UOP picking up Hyperkalemia - improved 1/12 Hyponatremia - improving AG lactic acidosis Possible rhabdo as a contributor here P:   Continue bicarb gtt for now Dr. Justin Mend following. Hopefully will be able to avoid HD with just resuscitation (pt and family OK with short term dialysis) Follow BMP Trend lactate. Consider renal ultrasound.  GASTROINTESTINAL A:   Poor PO intake GERD P:   Push PO as able. Protonix.  HEMATOLOGIC A:  Anemia VTE prophylaxis P:  Transfuse per usual  guidelines. SCD's / heparin.  INFECTIOUS A:   Isolated leukocytosis without clear evidence for infection - likely acute phase reactant P:   BCx2 1/10 >>  UC 1/10 >> E coli >>  Received vanco + zosyn x1 in ED. She denies any infectious prodrome or sx. Start ceftriaxone 1/12 for UTI, day 1 of 7  ENDOCRINE A:  DM Hypothyroidism P:   Hold metformin. Hold SSI for now given poor PO intake. Continue home Synthroid  NEUROLOGIC A:   Lethargy and weakness. These appear to be toxic metabolic P:   Supportive care and rehydration. Avoid any sedating medications.   FAMILY  - Updates: Updated pt's son 1/12.   - Inter-disciplinary family meet or Palliative Care meeting due by: 03/28/14   Will Transfer care to [facility] as of 1/13.   Baltazar Apo, MD, PhD 03/23/2014, 11:00 AM [facility] [phone] or if no answer [phone]

## 2014-03-23 NOTE — Progress Notes (Signed)
Bladder scanned for 119, no void since admitted, called md new orders rec'd to place foley

## 2014-03-23 NOTE — Progress Notes (Signed)
Subjective: No chest pain or SOB, still weak.Does not feel good but unable to tell me why.    Objective: Vital signs in last 24 hours: Temp:  [97.4 F (36.3 C)-99.3 F (37.4 C)] 99.3 F (37.4 C) (01/12 1100) Pulse Rate:  [51-79] 79 (01/12 0716) Resp:  [10-18] 11 (01/12 0716) BP: (112-133)/(29-50) 112/50 mmHg (01/12 0716) SpO2:  [96 %-100 %] 100 % (01/12 0716) Weight:  [140 lb 10.5 oz (63.8 kg)] 140 lb 10.5 oz (63.8 kg) (01/12 0500) Weight change: 5 lb 10.5 oz (2.565 kg)   Intake/Output from previous day: +2170  01/11 0701 - 01/12 0700 In: 2163.5 [P.O.:300; I.V.:1813.5; IV Piggyback:50] Out: 350 [Urine:350] Intake/Output this shift: Total I/O In: 620 [P.O.:120; I.V.:500] Out: 500 [Urine:500]  PE: General:Pleasant affect, NAD Skin:Warm and dry, brisk capillary refill HEENT:normocephalic, sclera clear, mucus membranes moist Heart:S1S2 RRR with 1-3/0 systolic murmur, no gallup, rub or click Lungs:diminished with crackles, no rhonchi, or wheezes QMV:HQIO, non tender, + BS, do not palpate liver spleen or masses Ext:no lower ext edema, 2+ pedal pulses, 2+ radial pulses Neuro:alert and oriented, MAE, follows commands, + facial symmetry TELE:  SB in the 40s and some junctional rhythm.  Lowest HR upper 30s.   Lab Results:  Recent Labs  03/21/14 1045 03/22/14 0457  WBC 12.4* 16.5*  HGB 10.5* 10.8*  HCT 32.6* 33.9*  PLT 340 338   BMET  Recent Labs  03/22/14 2030 03/23/14 0311  NA 133* 134*  K 5.1 4.2  CL 91* 90*  CO2 14* 18*  GLUCOSE 135* 139*  BUN 83* 79*  CREATININE 4.62* 4.43*  CALCIUM 8.5 8.2*    Recent Labs  03/22/14 0457 03/22/14 1055  TROPONINI 0.48* 0.48*    Lab Results  Component Value Date   CHOL 148 10/07/2013   HDL 40 10/07/2013   LDLCALC 86 10/07/2013   TRIG 111 10/07/2013   CHOLHDL 3.7 10/07/2013   Lab Results  Component Value Date   HGBA1C 6.8* 10/07/2013     No results found for: TSH  Hepatic Function Panel  Recent  Labs  03/21/14 1045  PROT 4.9*  ALBUMIN 2.6*  AST 54*  ALT 30  ALKPHOS 37*  BILITOT 0.7   No results for input(s): CHOL in the last 72 hours. No results for input(s): PROTIME in the last 72 hours.     Studies/Results: No results found. 2D Echo 03/22/14: Left ventricle: Inferobasal hypokinesis The cavity size was normal. Wall thickness was increased in a pattern of moderate LVH. The estimated ejection fraction was 55%. - Aortic valve: small systolic gradient with no significant stenosis - Mitral valve: There was mild regurgitation. - Right ventricle: The cavity size was mildly dilated. - Atrial septum: No defect or patent foramen ovale was identified. - Pulmonary arteries: PA peak pressure: 46 mm Hg (S).   Medications: I have reviewed the patient's current medications. Scheduled Meds: . aspirin EC  325 mg Oral Daily  . atorvastatin  80 mg Oral Daily  . cefTRIAXone (ROCEPHIN)  IV  1 g Intravenous Q24H  . heparin  5,000 Units Subcutaneous 3 times per day  . lactose free nutrition  237 mL Oral BID BM  . levothyroxine  112 mcg Oral QAC breakfast  . pantoprazole  40 mg Oral Daily   Continuous Infusions: . sodium chloride Stopped (03/22/14 1311)  .  sodium bicarbonate 150 mEq in sterile water 1000 mL infusion 100 mL/hr at 03/23/14 1018   PRN  Meds:.acetaminophen, ondansetron  Assessment/Plan: 81 y.o. female with a past medical history significant for diabetes, HTN, nonsmoker with history of left breast cancer treated with radiation-lumpectomy 15 years ago. She was seen for CHF 10/05/13. Her echo showed an EF of 20-25%. She was admitted for outpatient right and left heart catheterization for symptoms of class III CHF on 10/06/13. This revealed severe 3V CAD and she underwent CABG x 4 10/09/13. Post op she had PAF Rx'd with Amiodarone. She was placed in a skilled nursing facility for rehabilitation but gradually improved. She was seen in the office on 11/17/2013 and was doing  well at that time. She was subsequently seen in follow-up by Dr. Roxan Hockey, who performed her CABG. She was doing very well to time.  Patient was admitted with acute shock secondary to several days of poor PO intake and dehydration and weakness. She was bradycardic and hypotensive in acute renal failure. Mildly elevated trop most likely secondary to demand ischemia in setting of hypotension/bradycardia and acute renal failure. BB and amio have been stopped but Amio will take some time to get out of system. Still intermittently brady but BP stable. Will continue to follow off BB.  Acute renal failure (ARF)- creat slightly improved today. 4.62--> 4.43   Hypotension- now resolved  Bradycardia Now resolved off Coreg. HR in 93Y  Chronic systolic heart failure/Ischemic dilated cardiomyopathy- appears evolemic  CAD- S/P CABG x 4 --Mildly elevated trop most likely secondary to demand ischemia in setting of hypotension/bradycardia and acute renal failure.  -- No CP        LOS: 2 days   Time spent with pt. :15 minutes. Angelena Form R  Nurse Practitioner Certified Pager [phone] or after 5pm and on weekends call [phone] 03/23/2014, 1:19 PM

## 2014-03-24 DIAGNOSIS — E875 Hyperkalemia: Secondary | ICD-10-CM

## 2014-03-24 DIAGNOSIS — E440 Moderate protein-calorie malnutrition: Secondary | ICD-10-CM | POA: Diagnosis present

## 2014-03-24 LAB — CBC
HCT: 28.7 % — ABNORMAL LOW (ref 36.0–46.0)
Hemoglobin: 10 g/dL — ABNORMAL LOW (ref 12.0–15.0)
MCH: 34.1 pg — ABNORMAL HIGH (ref 26.0–34.0)
MCHC: 34.8 g/dL (ref 30.0–36.0)
MCV: 98 fL (ref 78.0–100.0)
Platelets: 247 10*3/uL (ref 150–400)
RBC: 2.93 MIL/uL — ABNORMAL LOW (ref 3.87–5.11)
RDW: 14 % (ref 11.5–15.5)
WBC: 11.2 10*3/uL — ABNORMAL HIGH (ref 4.0–10.5)

## 2014-03-24 LAB — BASIC METABOLIC PANEL
ANION GAP: 14 (ref 5–15)
Anion gap: 13 (ref 5–15)
BUN: 72 mg/dL — AB (ref 6–23)
BUN: 63 mg/dL — ABNORMAL HIGH (ref 6–23)
CALCIUM: 7.6 mg/dL — AB (ref 8.4–10.5)
CO2: 35 mmol/L — AB (ref 19–32)
CO2: 38 mmol/L — AB (ref 19–32)
Calcium: 7.4 mg/dL — ABNORMAL LOW (ref 8.4–10.5)
Chloride: 85 mEq/L — ABNORMAL LOW (ref 96–112)
Chloride: 84 mEq/L — ABNORMAL LOW (ref 96–112)
Creatinine, Ser: 2.9 mg/dL — ABNORMAL HIGH (ref 0.50–1.10)
Creatinine, Ser: 2.13 mg/dL — ABNORMAL HIGH (ref 0.50–1.10)
GFR calc Af Amer: 16 mL/min — ABNORMAL LOW (ref >90)
GFR calc Af Amer: 23 mL/min — ABNORMAL LOW (ref >90)
GFR calc non Af Amer: 20 mL/min — ABNORMAL LOW (ref >90)
GFR, EST NON AFRICAN AMERICAN: 14 mL/min — AB (ref >90)
GLUCOSE: 130 mg/dL — AB (ref 70–99)
Glucose, Bld: 239 mg/dL — ABNORMAL HIGH (ref 70–99)
POTASSIUM: 2.8 mmol/L — AB (ref 3.5–5.1)
Potassium: 2.9 mmol/L — ABNORMAL LOW (ref 3.5–5.1)
Sodium: 133 mmol/L — ABNORMAL LOW (ref 135–145)
Sodium: 136 mmol/L (ref 135–145)

## 2014-03-24 LAB — URINE CULTURE: Colony Count: >=100000

## 2014-03-24 LAB — PHOSPHORUS: Phosphorus: 4.7 mg/dL — ABNORMAL HIGH (ref 2.3–4.6)

## 2014-03-24 LAB — MAGNESIUM: MAGNESIUM: 1.8 mg/dL (ref 1.5–2.5)

## 2014-03-24 MED ORDER — POTASSIUM CHLORIDE CRYS ER 20 MEQ PO TBCR
40.0000 meq | EXTENDED_RELEASE_TABLET | Freq: Once | ORAL | Status: AC
Start: 2014-03-24 — End: 2014-03-24
  Administered 2014-03-24: 40 meq via ORAL
  Filled 2014-03-24: qty 2

## 2014-03-24 MED ORDER — POTASSIUM CHLORIDE CRYS ER 20 MEQ PO TBCR
40.0000 meq | EXTENDED_RELEASE_TABLET | Freq: Once | ORAL | Status: AC
  Administered 2014-03-24: 40 meq via ORAL
  Filled 2014-03-24: qty 2

## 2014-03-24 NOTE — Progress Notes (Signed)
[facility] ROUNDING NOTE   Subjective:   Interval History significantly improved  Objective:  Vital signs in last 24 hours:  Temp:  [97.6 F (36.4 C)-99 F (37.2 C)] 98.1 F (36.7 C) (01/13 1148) Pulse Rate:  [66-76] 72 (01/13 1148) Resp:  [11-22] 15 (01/13 1148) BP: (89-112)/(40-66) 110/66 mmHg (01/13 1148) SpO2:  [93 %-100 %] 97 % (01/13 1148) Weight:  [64.9 kg (143 lb 1.3 oz)] 64.9 kg (143 lb 1.3 oz) (01/13 0440)  Weight change: 1.1 kg (2 lb 6.8 oz) Filed Weights   03/22/14 0337 03/23/14 0500 03/24/14 0440  Weight: 63.776 kg (140 lb 9.6 oz) 63.8 kg (140 lb 10.5 oz) 64.9 kg (143 lb 1.3 oz)    Intake/Output: I/O last 3 completed shifts: In: 8676 [P.O.:420; I.V.:2900; IV Piggyback:50] Out: 1950 [Urine:1550]   Intake/Output this shift:  Total I/O In: -  Out: 500 [Urine:500]  CVS- RRR RS- CTA ABD- BS present soft non-distended EXT- no edema   Basic Metabolic Panel:  Recent Labs Lab 03/21/14 1631 03/22/14 0457 03/22/14 2030 03/23/14 0311 03/24/14 0209  NA 133* 130* 133* 134* 133*  K 5.1 5.4* 5.1 4.2 2.8*  CL 95* 93* 91* 90* 85*  CO2 13* 11* 14* 18* 35*  GLUCOSE 104* 92 135* 139* 130*  BUN 62* 74* 83* 79* 72*  CREATININE 3.84* 4.40* 4.62* 4.43* 2.90*  CALCIUM 8.5 8.5 8.5 8.2* 7.4*  MG  --   --   --   --  1.8  PHOS  --   --   --   --  4.7*    Liver Function Tests:  Recent Labs Lab 03/21/14 1045  AST 54*  ALT 30  ALKPHOS 37*  BILITOT 0.7  PROT 4.9*  ALBUMIN 2.6*   No results for input(s): LIPASE, AMYLASE in the last 168 hours. No results for input(s): AMMONIA in the last 168 hours.  CBC:  Recent Labs Lab 03/21/14 1045 03/22/14 0457 03/24/14 0209  WBC 12.4* 16.5* 11.2*  HGB 10.5* 10.8* 10.0*  HCT 32.6* 33.9* 28.7*  MCV 101.6* 103.4* 98.0  PLT 340 338 247    Cardiac Enzymes:  Recent Labs Lab 03/21/14 1045 03/21/14 1631 03/21/14 2105 03/22/14 0457 03/22/14 1055  CKTOTAL  --   --   --   --  100  TROPONINI 0.81*  0.51* 0.49* 0.48* 0.48*    BNP: Invalid input(s): POCBNP  CBG:  Recent Labs Lab 03/21/14 2005  GLUCAP 83    Microbiology: Results for orders placed or performed during the hospital encounter of 03/21/14  Blood culture (routine x 2)     Status: None (Preliminary result)   Collection Time: 03/21/14 12:40 PM  Result Value Ref Range Status   Specimen Description BLOOD RIGHT ARM  Final   Special Requests BOTTLES DRAWN AEROBIC AND ANAEROBIC 10 CC  Final   Culture   Final           BLOOD CULTURE RECEIVED NO GROWTH TO DATE CULTURE WILL BE HELD FOR 5 DAYS BEFORE ISSUING A FINAL NEGATIVE REPORT Performed at [facility]    Report Status PENDING  Incomplete  Blood culture (routine x 2)     Status: None (Preliminary result)   Collection Time: 03/21/14  1:00 PM  Result Value Ref Range Status   Specimen Description BLOOD RIGHT ARM  Final   Special Requests BOTTLES DRAWN AEROBIC AND ANAEROBIC 10 CC  Final   Culture   Final  BLOOD CULTURE RECEIVED NO GROWTH TO DATE CULTURE WILL BE HELD FOR 5 DAYS BEFORE ISSUING A FINAL NEGATIVE REPORT Performed at [facility]    Report Status PENDING  Incomplete  Urine culture     Status: None   Collection Time: 03/21/14  1:24 PM  Result Value Ref Range Status   Specimen Description URINE, CATHETERIZED  Final   Special Requests NONE  Final   Colony Count   Final    >=100,000 COLONIES/ML Performed at [facility]    Culture   Final    ESCHERICHIA COLI Performed at [facility]    Report Status 03/24/2014 FINAL  Final   Organism ID, Bacteria ESCHERICHIA COLI  Final      Susceptibility   Escherichia coli - MIC*    AMPICILLIN <=2 SENSITIVE Sensitive     CEFAZOLIN <=4 SENSITIVE Sensitive     CEFTRIAXONE <=1 SENSITIVE Sensitive     CIPROFLOXACIN <=0.25 SENSITIVE Sensitive     GENTAMICIN <=1 SENSITIVE Sensitive     LEVOFLOXACIN <=0.12 SENSITIVE Sensitive     NITROFURANTOIN <=16 SENSITIVE Sensitive      TOBRAMYCIN <=1 SENSITIVE Sensitive     TRIMETH/SULFA <=20 SENSITIVE Sensitive     PIP/TAZO <=4 SENSITIVE Sensitive     * ESCHERICHIA COLI  MRSA PCR Screening     Status: None   Collection Time: 03/21/14 10:57 PM  Result Value Ref Range Status   MRSA by PCR NEGATIVE NEGATIVE Final    Comment:        The GeneXpert MRSA Assay (FDA approved for NASAL specimens only), is one component of a comprehensive MRSA colonization surveillance program. It is not intended to diagnose MRSA infection nor to guide or monitor treatment for MRSA infections.     Coagulation Studies: No results for input(s): LABPROT, INR in the last 72 hours.  Urinalysis:  Recent Labs  03/22/14 1509  COLORURINE AMBER*  LABSPEC 1.024  PHURINE 5.0  GLUCOSEU NEGATIVE  HGBUR NEGATIVE  BILIRUBINUR SMALL*  KETONESUR 15*  PROTEINUR 30*  UROBILINOGEN 0.2  NITRITE NEGATIVE  LEUKOCYTESUR SMALL*      Imaging: No results found.   Medications:   . sodium chloride Stopped (03/22/14 1311)   . aspirin EC  325 mg Oral Daily  . atorvastatin  80 mg Oral Daily  . cefTRIAXone (ROCEPHIN)  IV  1 g Intravenous Q24H  . heparin  5,000 Units Subcutaneous 3 times per day  . lactose free nutrition  237 mL Oral BID BM  . levothyroxine  112 mcg Oral QAC breakfast  . pantoprazole  40 mg Oral Daily   acetaminophen, ondansetron  Assessment/ Plan:   Acute kidney injury Most likely represents ischemic ATN. This would be most consistent with the low blood pressure and bradycardia. She also was taking lisinopril that could also have caused acute kidney injury although it is unclear if this had been stopped earlier. Renal function improved  Hypokalemiareplaced  Metabolic acidosis secondary to 1 Also was using metformin  Stop bicarbonate   Hyponatremia secondary to 1  Hypotension agree with stopping all cardiac medication  Hypocalcemia corrects for low albumin     LOS: 3 Jamarkis Branam W @TODAY @1 :29 PM

## 2014-03-24 NOTE — Progress Notes (Signed)
Patient Name: Marissa Jefferson Date of Encounter: 03/24/2014   Principal Problem:   Acute renal failure (ARF) Active Problems:   Hypotension   Bradycardia   Shock circulatory   Chronic systolic heart failure   Diabetes mellitus type 2, controlled   CAD (coronary artery disease), native coronary artery   Ischemic dilated cardiomyopathy   S/P CABG x 4   Hyperkalemia   HTN (hypertension)   Hyperlipidemia   Hypothyroidism   GERD (gastroesophageal reflux disease)    SUBJECTIVE  Renal fxn improving.  Stable rhythm on tele - no AF/no bradycardia.  Feeling a little bit stronger.  Appetite starting to come back.  No chest pain or sob.  CURRENT MEDS . aspirin EC  325 mg Oral Daily  . atorvastatin  80 mg Oral Daily  . cefTRIAXone (ROCEPHIN)  IV  1 g Intravenous Q24H  . heparin  5,000 Units Subcutaneous 3 times per day  . lactose free nutrition  237 mL Oral BID BM  . levothyroxine  112 mcg Oral QAC breakfast  . pantoprazole  40 mg Oral Daily    OBJECTIVE  Filed Vitals:   03/23/14 1955 03/23/14 2310 03/24/14 0440 03/24/14 0701  BP: 90/40 98/45 112/57 110/52  Pulse: 76 74 66 68  Temp:  99 F (37.2 C) 97.7 F (36.5 C) 97.6 F (36.4 C)  TempSrc:  Oral Oral Oral  Resp: 18 15 11 22   Height:      Weight:   143 lb 1.3 oz (64.9 kg)   SpO2: 93% 94% 100% 98%    Intake/Output Summary (Last 24 hours) at 03/24/14 1012 Last data filed at 03/24/14 0800  Gross per 24 hour  Intake   2210 ml  Output   1325 ml  Net    885 ml   Filed Weights   03/22/14 0337 03/23/14 0500 03/24/14 0440  Weight: 140 lb 9.6 oz (63.776 kg) 140 lb 10.5 oz (63.8 kg) 143 lb 1.3 oz (64.9 kg)    PHYSICAL EXAM  General: Pleasant, NAD. Neuro: Alert and oriented X 3. Moves all extremities spontaneously. Psych: Normal affect. HEENT:  Normal  Neck: Supple without bruits or JVD. Lungs:  Resp regular and unlabored, CTA. Heart: RRR no s3, s4, or murmurs. Abdomen: Soft, non-tender, non-distended, BS + x 4.    Extremities: No clubbing, cyanosis or edema. DP/PT/Radials 2+ and equal bilaterally. Skin: very dry/flaking.  Accessory Clinical Findings  CBC  Recent Labs  03/22/14 0457 03/24/14 0209  WBC 16.5* 11.2*  HGB 10.8* 10.0*  HCT 33.9* 28.7*  MCV 103.4* 98.0  PLT 338 485   Basic Metabolic Panel  Recent Labs  03/23/14 0311 03/24/14 0209  NA 134* 133*  K 4.2 2.8*  CL 90* 85*  CO2 18* 35*  GLUCOSE 139* 130*  BUN 79* 72*  CREATININE 4.43* 2.90*  CALCIUM 8.2* 7.4*  MG  --  1.8  PHOS  --  4.7*   Liver Function Tests  Recent Labs  03/21/14 1045  AST 54*  ALT 30  ALKPHOS 37*  BILITOT 0.7  PROT 4.9*  ALBUMIN 2.6*   Cardiac Enzymes  Recent Labs  03/21/14 2105 03/22/14 0457 03/22/14 1055  CKTOTAL  --   --  100  TROPONINI 0.49* 0.48* 0.48*   TELE  rsr  Radiology/Studies  Dg Chest Port 1 View  03/21/2014   CLINICAL DATA:  Weakness  EXAM: PORTABLE CHEST - 1 VIEW  COMPARISON:  01/05/2014  FINDINGS: Chronic interstitial markings. No focal consolidation. No pleural  effusion or pneumothorax.  The heart is normal in size. Postsurgical changes related to prior CABG.  IMPRESSION: No evidence of acute cardiopulmonary disease.   Electronically Signed   By: Julian Hy M.D.   On: 03/21/2014 13:38    ASSESSMENT AND PLAN  81 y.o. female with a past medical history significant for diabetes, HTN, nonsmoker with history of left breast cancer treated with radiation-lumpectomy 15 years ago. She was seen for CHF 10/05/13. Her echo showed an EF of 20-25%. She was admitted for outpatient right and left heart catheterization for symptoms of class III CHF on 10/06/13. This revealed severe 3V CAD and she underwent CABG x 4 10/09/13. Post op she had PAF Rx'd with Amiodarone. She was placed in a skilled nursing facility for rehabilitation but gradually improved. She was seen in the office on 11/17/2013 and was doing well at that time. She was subsequently seen in follow-up by Dr.  Roxan Hockey, who performed her CABG. She was doing very well to time.  Patient was admitted with acute shock secondary to several days of poor PO intake, UTI, dehydration, and weakness. She was bradycardic and hypotensive in acute renal failure. Mildly elevated trop most likely secondary to demand ischemia in setting of hypotension/bradycardia and acute renal failure.   1.  Acute renal failure/Ischemic ATN:  In setting of dehydration, hypotn, acei/diuretic.  Improving.  Nephrology f0llowing.  Acei/hctz on hold.  2.  Hypotension:  BP's stable in 90's to low 100's.  Bb/acei/hctz remain on hold.  3.  CAD/troponin elevation:  S/p CABG.  No chest pain.  Trop peaked @ 0.81 with flat trend - suspect 2/2 demand ischemia in setting of # 1/2.  No further ischemic eval @ this time.  Bb/acei on hold.  Continue ASA and statin.  4.  Post-op Afib:  Was on amio, which has been d/c'd in setting of bradycardia on presentation.  Stable rhythm.  Leave off amio going forward.  Would ideally like to resume bb once BP stable.  5.  H/O ICM/Chronic systolic CHF:  EF as low as 20-25% in July 2015 but has since recovered - 55% by echo this admission.  Was on bb/acei @ home but both on hold in setting of hypotension, bradycardia, and acute renal failure.  Would hope to be able to resume in the future but will have to wait until full recovery from renal failure with resolution of hypotension (bradycardia has resolved).  6.  UTI:  Rocephin per IM.  7. DM:  Per IM.  8.  Hyperkalemia/Hypokalemia:  K 2.8 this AM.  She received supplementation earlier this AM.  Follow.  Signed, Murray Hodgkins NP  I have personally seen and examined patient and agree with note above as outlined by Ignacia Bayley NP.  HR now normalized in NSR off BB and Amio.  Will not restart Amio since she had been on it for post op afib and is 5 months out from CABG.  Consider restarting low dose BB if renal function improves and BP improves.     Signed: Fransico Him, MD [facility] 03/24/2014

## 2014-03-24 NOTE — Progress Notes (Signed)
Marissa Jefferson OFB:[id number] DOB: June 24, 1924 DOA: 03/21/2014 PCP: Thressa Sheller, MD  Admit HPI / Brief Narrative: 81 year old woman w/ history of CAD/CABG, ischemic cardiomyopathy, postop A. Fib, diabetes, and hypertension who presented with 3-4 days of poor intake and progressive malaise and weakness. In the ED she was severely bradycardic (sinus) and hypotensive. Labs identified acute renal failure with associated hyperkalemia and metabolic acidosis. She was admitted for further care 1/10. She stated that her CODE STATUS should be DO NOT RESUSCITATE and that she is not interested in long term hemodialysis.   HPI/Subjective: Pt is alert and conversant, and states she feels "much much better" today.  Denies cp, n/v, abdom pain, or sob.    Assessment/Plan:  Sinus bradycardia Now off amio and BB - resolved - may resume low dose BB prior to d/c and follow, given known CAD/CABG  Cardiogenic shock / Hypovolemic shock Stabilizing nicely - shock resolved w/ resolution of bradycardia and volume resuscitation  Elevated troponin Due to above + renal failure - no SSCP - Cardiology following   Parox Afib post CABG Cards not resuming amio - watch on tele   CAD s/p CABG Attempt to resume low dose BB prior to d/c - cont ASA  Chronic ischemic cardiomyopathy EF as low as 20-25% in July 2015 but has since recovered - 55% by echo this admission - NO ACE/ARB due to acute renal failure  Acute Renal failure ATN due to hypovolemic shock - improving w/ volume - Nephrology has seen   Hyperkalemia > Hypokalemia Gently replace and follow trend   Hyponatremia Likely hypovolemic in nature - cont to volume expand and monitor  Lactic acidosis Cont volume resuscitation - recheck lactate in AM    GERD  Anemia  Pan sensitive E coli UTI Cont rocephin to complete 7 days of abx - no evidence of bacteremia   Toxic metabolic encephalopathy Due to  above - resolved, w/ pt back to baseline MS  Hypothyroidism  Cont home synthroid dose  Code Status: NO CODE - DNR Family Communication: spoke w/ daughter at bedside at length Disposition Plan: SDU   Consultants: Cardiology  Nephrology   Procedures: TTE 1/10 > inferobasal hypokinesis - mod LVH - EF 55% - mild mitral regurg   Antibiotics: Rocephin 1/12 > Zosyn 1/10 > 1/11 Vanc 1/10  DVT prophylaxis: SQ heparin   Objective: Blood pressure 110/66, pulse 72, temperature 98.1 F (36.7 C), temperature source Oral, resp. rate 15, height 5\' 2"  (1.575 m), weight 64.9 kg (143 lb 1.3 oz), SpO2 97 %.  Intake/Output Summary (Last 24 hours) at 03/24/14 1413 Last data filed at 03/24/14 1100  Gross per 24 hour  Intake   1390 ml  Output   1200 ml  Net    190 ml   Exam: General: No acute respiratory distress Lungs: Clear to auscultation bilaterally without wheezes or crackles Cardiovascular: Regular rate and rhythm without murmur gallop or rub normal S1 and S2 Abdomen: Nontender, nondistended, soft, bowel sounds positive, no rebound, no ascites, no appreciable mass Extremities: No significant cyanosis, clubbing, or edema bilateral lower extremities  Data Reviewed: Basic Metabolic Panel:  Recent Labs Lab 03/21/14 1631 03/22/14 0457 03/22/14 2030 03/23/14 0311 03/24/14 0209  NA 133* 130* 133* 134* 133*  K 5.1 5.4* 5.1 4.2 2.8*  CL 95* 93* 91* 90* 85*  CO2 13* 11* 14* 18* 35*  GLUCOSE 104* 92 135* 139* 130*  BUN 62* 74* 83* 79* 72*  CREATININE 3.84* 4.40* 4.62* 4.43* 2.90*  CALCIUM 8.5 8.5 8.5 8.2* 7.4*  MG  --   --   --   --  1.8  PHOS  --   --   --   --  4.7*    Liver Function Tests:  Recent Labs Lab 03/21/14 1045  AST 54*  ALT 30  ALKPHOS 37*  BILITOT 0.7  PROT 4.9*  ALBUMIN 2.6*   CBC:  Recent Labs Lab 03/21/14 1045 03/22/14 0457 03/24/14 0209  WBC 12.4* 16.5* 11.2*  HGB 10.5* 10.8* 10.0*  HCT 32.6* 33.9* 28.7*  MCV 101.6* 103.4* 98.0  PLT 340 338  247    Cardiac Enzymes:  Recent Labs Lab 03/21/14 1045 03/21/14 1631 03/21/14 2105 03/22/14 0457 03/22/14 1055  CKTOTAL  --   --   --   --  100  TROPONINI 0.81* 0.51* 0.49* 0.48* 0.48*   BNP (last 3 results)  Recent Labs  10/14/13 0350  PROBNP 2044.0*    CBG:  Recent Labs Lab 03/21/14 2005  GLUCAP 83    Recent Results (from the past 240 hour(s))  Blood culture (routine x 2)     Status: None (Preliminary result)   Collection Time: 03/21/14 12:40 PM  Result Value Ref Range Status   Specimen Description BLOOD RIGHT ARM  Final   Special Requests BOTTLES DRAWN AEROBIC AND ANAEROBIC 10 CC  Final   Culture   Final           BLOOD CULTURE RECEIVED NO GROWTH TO DATE CULTURE WILL BE HELD FOR 5 DAYS BEFORE ISSUING A FINAL NEGATIVE REPORT Performed at [facility]    Report Status PENDING  Incomplete  Blood culture (routine x 2)     Status: None (Preliminary result)   Collection Time: 03/21/14  1:00 PM  Result Value Ref Range Status   Specimen Description BLOOD RIGHT ARM  Final   Special Requests BOTTLES DRAWN AEROBIC AND ANAEROBIC 10 CC  Final   Culture   Final           BLOOD CULTURE RECEIVED NO GROWTH TO DATE CULTURE WILL BE HELD FOR 5 DAYS BEFORE ISSUING A FINAL NEGATIVE REPORT Performed at [facility]    Report Status PENDING  Incomplete  Urine culture     Status: None   Collection Time: 03/21/14  1:24 PM  Result Value Ref Range Status   Specimen Description URINE, CATHETERIZED  Final   Special Requests NONE  Final   Colony Count   Final    >=100,000 COLONIES/ML Performed at [facility]    Culture   Final    ESCHERICHIA COLI Performed at [facility]    Report Status 03/24/2014 FINAL  Final   Organism ID, Bacteria ESCHERICHIA COLI  Final      Susceptibility   Escherichia coli - MIC*    AMPICILLIN <=2 SENSITIVE Sensitive     CEFAZOLIN <=4 SENSITIVE Sensitive     CEFTRIAXONE <=1 SENSITIVE Sensitive     CIPROFLOXACIN  <=0.25 SENSITIVE Sensitive     GENTAMICIN <=1 SENSITIVE Sensitive     LEVOFLOXACIN <=0.12 SENSITIVE Sensitive     NITROFURANTOIN <=16 SENSITIVE Sensitive     TOBRAMYCIN <=1 SENSITIVE Sensitive     TRIMETH/SULFA <=20 SENSITIVE Sensitive     PIP/TAZO <=4 SENSITIVE Sensitive     * ESCHERICHIA COLI  MRSA PCR Screening     Status: None   Collection Time: 03/21/14 10:57 PM  Result Value Ref Range Status  MRSA by PCR NEGATIVE NEGATIVE Final    Comment:        The GeneXpert MRSA Assay (FDA approved for NASAL specimens only), is one component of a comprehensive MRSA colonization surveillance program. It is not intended to diagnose MRSA infection nor to guide or monitor treatment for MRSA infections.      Studies:  Recent x-ray studies have been reviewed in detail by the Attending Physician  Scheduled Meds:  Scheduled Meds: . aspirin EC  325 mg Oral Daily  . atorvastatin  80 mg Oral Daily  . cefTRIAXone (ROCEPHIN)  IV  1 g Intravenous Q24H  . heparin  5,000 Units Subcutaneous 3 times per day  . lactose free nutrition  237 mL Oral BID BM  . levothyroxine  112 mcg Oral QAC breakfast  . pantoprazole  40 mg Oral Daily    Time spent on care of this patient: 35 mins   Obed Samek T , MD   [facility] Office  [phone] Pager - Text Page per Shea Evans as per below:  On-Call/Text Page:      [email or web address]      password [email or web address]  If 7PM-7AM, please contact night-coverage [email or web address] Password [email or web address] 03/24/2014, 2:13 PM   LOS: 3 days

## 2014-03-25 DIAGNOSIS — E038 Other specified hypothyroidism: Secondary | ICD-10-CM

## 2014-03-25 DIAGNOSIS — I255 Ischemic cardiomyopathy: Secondary | ICD-10-CM | POA: Insufficient documentation

## 2014-03-25 DIAGNOSIS — E876 Hypokalemia: Secondary | ICD-10-CM

## 2014-03-25 DIAGNOSIS — R778 Other specified abnormalities of plasma proteins: Secondary | ICD-10-CM | POA: Diagnosis present

## 2014-03-25 DIAGNOSIS — N170 Acute kidney failure with tubular necrosis: Principal | ICD-10-CM

## 2014-03-25 DIAGNOSIS — I272 Pulmonary hypertension, unspecified: Secondary | ICD-10-CM | POA: Diagnosis present

## 2014-03-25 DIAGNOSIS — I270 Primary pulmonary hypertension: Secondary | ICD-10-CM

## 2014-03-25 DIAGNOSIS — I25709 Atherosclerosis of coronary artery bypass graft(s), unspecified, with unspecified angina pectoris: Secondary | ICD-10-CM

## 2014-03-25 DIAGNOSIS — B9620 Unspecified Escherichia coli [E. coli] as the cause of diseases classified elsewhere: Secondary | ICD-10-CM

## 2014-03-25 DIAGNOSIS — R570 Cardiogenic shock: Secondary | ICD-10-CM

## 2014-03-25 DIAGNOSIS — I9789 Other postprocedural complications and disorders of the circulatory system, not elsewhere classified: Secondary | ICD-10-CM | POA: Diagnosis present

## 2014-03-25 DIAGNOSIS — N390 Urinary tract infection, site not specified: Secondary | ICD-10-CM

## 2014-03-25 DIAGNOSIS — R001 Bradycardia, unspecified: Secondary | ICD-10-CM | POA: Insufficient documentation

## 2014-03-25 DIAGNOSIS — E871 Hypo-osmolality and hyponatremia: Secondary | ICD-10-CM

## 2014-03-25 DIAGNOSIS — I480 Paroxysmal atrial fibrillation: Secondary | ICD-10-CM

## 2014-03-25 DIAGNOSIS — R571 Hypovolemic shock: Secondary | ICD-10-CM

## 2014-03-25 DIAGNOSIS — R7989 Other specified abnormal findings of blood chemistry: Secondary | ICD-10-CM | POA: Diagnosis present

## 2014-03-25 DIAGNOSIS — I4891 Unspecified atrial fibrillation: Secondary | ICD-10-CM | POA: Diagnosis present

## 2014-03-25 DIAGNOSIS — G9341 Metabolic encephalopathy: Secondary | ICD-10-CM

## 2014-03-25 LAB — BASIC METABOLIC PANEL
ANION GAP: 7 (ref 5–15)
BUN: 37 mg/dL — ABNORMAL HIGH (ref 6–23)
CALCIUM: 8.1 mg/dL — AB (ref 8.4–10.5)
CO2: 37 mmol/L — ABNORMAL HIGH (ref 19–32)
CREATININE: 1.14 mg/dL — AB (ref 0.50–1.10)
Chloride: 93 mEq/L — ABNORMAL LOW (ref 96–112)
GFR calc non Af Amer: 42 mL/min — ABNORMAL LOW (ref >90)
GFR, EST AFRICAN AMERICAN: 49 mL/min — AB (ref >90)
Glucose, Bld: 240 mg/dL — ABNORMAL HIGH (ref 70–99)
Potassium: 3.6 mmol/L (ref 3.5–5.1)
Sodium: 137 mmol/L (ref 135–145)

## 2014-03-25 LAB — COMPREHENSIVE METABOLIC PANEL
ALBUMIN: 2.3 g/dL — AB (ref 3.5–5.2)
ALK PHOS: 40 U/L (ref 39–117)
ALT: 28 U/L (ref 0–35)
AST: 42 U/L — AB (ref 0–37)
Anion gap: 8 (ref 5–15)
BILIRUBIN TOTAL: 0.3 mg/dL (ref 0.3–1.2)
BUN: 50 mg/dL — ABNORMAL HIGH (ref 6–23)
CALCIUM: 8.1 mg/dL — AB (ref 8.4–10.5)
CHLORIDE: 91 meq/L — AB (ref 96–112)
CO2: 41 mmol/L (ref 19–32)
CREATININE: 1.46 mg/dL — AB (ref 0.50–1.10)
GFR calc Af Amer: 36 mL/min — ABNORMAL LOW (ref >90)
GFR calc non Af Amer: 31 mL/min — ABNORMAL LOW (ref >90)
Glucose, Bld: 152 mg/dL — ABNORMAL HIGH (ref 70–99)
Potassium: 2.9 mmol/L — ABNORMAL LOW (ref 3.5–5.1)
SODIUM: 140 mmol/L (ref 135–145)
Total Protein: 4.5 g/dL — ABNORMAL LOW (ref 6.0–8.3)

## 2014-03-25 LAB — TROPONIN I: Troponin I: 0.25 ng/mL — ABNORMAL HIGH (ref <0.031)

## 2014-03-25 LAB — CBC
HEMATOCRIT: 31.7 % — AB (ref 36.0–46.0)
HEMOGLOBIN: 10.5 g/dL — AB (ref 12.0–15.0)
MCH: 33.3 pg (ref 26.0–34.0)
MCHC: 33.1 g/dL (ref 30.0–36.0)
MCV: 100.6 fL — ABNORMAL HIGH (ref 78.0–100.0)
PLATELETS: 250 10*3/uL (ref 150–400)
RBC: 3.15 MIL/uL — ABNORMAL LOW (ref 3.87–5.11)
RDW: 14.2 % (ref 11.5–15.5)
WBC: 8.3 10*3/uL (ref 4.0–10.5)

## 2014-03-25 LAB — MAGNESIUM: MAGNESIUM: 1.7 mg/dL (ref 1.5–2.5)

## 2014-03-25 MED ORDER — CARVEDILOL 3.125 MG PO TABS
3.1250 mg | ORAL_TABLET | Freq: Two times a day (BID) | ORAL | Status: DC
  Administered 2014-03-26 – 2014-03-30 (×9): 3.125 mg via ORAL
  Filled 2014-03-25 (×12): qty 1

## 2014-03-25 MED ORDER — MAGNESIUM SULFATE 2 GM/50ML IV SOLN
2.0000 g | Freq: Once | INTRAVENOUS | Status: AC
  Administered 2014-03-25: 2 g via INTRAVENOUS
  Filled 2014-03-25: qty 50

## 2014-03-25 MED ORDER — POTASSIUM CHLORIDE CRYS ER 20 MEQ PO TBCR
40.0000 meq | EXTENDED_RELEASE_TABLET | Freq: Once | ORAL | Status: AC
Start: 2014-03-25 — End: 2014-03-25
  Administered 2014-03-25: 40 meq via ORAL
  Filled 2014-03-25: qty 2

## 2014-03-25 NOTE — Progress Notes (Signed)
Utilization review completed.  

## 2014-03-25 NOTE — Clinical Social Work Psychosocial (Signed)
Clinical Social Work Department BRIEF PSYCHOSOCIAL ASSESSMENT 03/25/2014  Patient:  Marissa Jefferson, Marissa Jefferson     Account Number:  [id number]     Admit date:  03/21/2014  Clinical Social Worker:  Marciano Sequin  Date/Time:  03/25/2014 03:07 PM  Referred by:  RN  Date Referred:  03/25/2014 Referred for  SNF Placement   Other Referral:   Interview type:  Patient Other interview type:    PSYCHOSOCIAL DATA Living Status:  ALONE Admitted from facility:   Level of care:   Primary support name:  Richardson,[name] Primary support relationship to patient:  CHILD, ADULT Degree of support available:   Strong Support    CURRENT CONCERNS Current Concerns  None Noted   Other Concerns:    SOCIAL WORK ASSESSMENT / PLAN CSW met pt at bedside. CSW introduced self and purpose of visit. CSW and the pt discussed the clinical team recommendations for rehab. CSW and the pt discussed the geographic area in which the pt is interest in receiving rehab. The pt expressed interested in [facility]. CSW provided the pt with contact information for further questions. CSW will continue to follow this pt and assist with discharge as needed.   Assessment/plan status:  Psychosocial Support/Ongoing Assessment of Needs Other assessment/ plan:   Information/referral to community resources:    PATIENT'S/FAMILY'S RESPONSE TO PLAN OF CARE: Pt presented with a normal mood and affect. Pt oriented 4x. Pt reported wishing she can go home. Pt expressed knowing she needs assist with walking. Pt reported having a positive discussion with PT regarding rehab. Pt understand and agree with her plan of care.    [place], MSW, [place]

## 2014-03-25 NOTE — Evaluation (Signed)
Occupational Therapy Evaluation Patient Details Name: Marissa Jefferson MRN: [id number] DOB: Apr 11, 1924 Today's Date: 03/25/2014    History of Present Illness Lennyn Gange is an 81 y.o. Female admitted 1/10/16with lethargy and weakness. PMH of CAD, CABG x4 (2015), ischemic cardiomyopathy, postop A. fib, DM, HTN. Pt was bradycardic and hypotensive in ED.    Clinical Impression   PTA pt lived at home alone and reports that she was independent with ADLs (sponge bathes). Pt reports that someone comes to assist with cleaning, but she is able to cook. Pt is limited by generalized weakness and required mod (A) for functional mobility and LB ADLs with +2 for safety. Pt will need rehab at SNF for strengthening and endurance.     Follow Up Recommendations  SNF;Supervision/Assistance - 24 hour    Equipment Recommendations  None recommended by OT    Recommendations for Other Services       Precautions / Restrictions Precautions Precautions: Fall Restrictions Weight Bearing Restrictions: No      Mobility Bed Mobility Overal bed mobility: Needs Assistance Bed Mobility: Supine to Sit     Supine to sit: Min guard;HOB elevated     General bed mobility comments: increased time to perform  Transfers Overall transfer level: Needs assistance Equipment used: Rolling walker (2 wheeled) Transfers: Sit to/from [facility] Sit to Stand: Mod assist;+2 safety/equipment Stand pivot transfers: Mod assist;+2 safety/equipment       General transfer comment: Pt initially min (A), however fatigues quickly and requires mod (A) for mobility.          ADL Overall ADL's : Needs assistance/impaired Eating/Feeding: Set up;Sitting   Grooming: Brushing hair;Set up;Sitting   Upper Body Bathing: Set up;Sitting   Lower Body Bathing: Moderate assistance;Sit to/from stand;+2 for safety/equipment   Upper Body Dressing : Set up;Sitting   Lower Body Dressing: Moderate assistance;+2 for  safety/equipment;Sit to/from stand   Toilet Transfer: Moderate assistance;+2 for safety/equipment;Stand-pivot;RW;BSC   Toileting- Clothing Manipulation and Hygiene: Total assistance;+2 for safety/equipment;Sit to/from stand         General ADL Comments: Pt limited by overall weakness.        Perception Perception Perception Tested?: No   Praxis Praxis Praxis tested?: Within functional limits    Pertinent Vitals/Pain Pain Assessment: No/denies pain (did grimace during transfer)     Hand Dominance Right   Extremity/Trunk Assessment Upper Extremity Assessment Upper Extremity Assessment: Generalized weakness   Lower Extremity Assessment Lower Extremity Assessment: Generalized weakness       Communication Communication Communication: No difficulties   Cognition Arousal/Alertness: Awake/alert Behavior During Therapy: WFL for tasks assessed/performed Overall Cognitive Status: No family/caregiver present to determine baseline cognitive functioning Area of Impairment: Attention;Following commands;Awareness;Problem solving   Current Attention Level: Focused (multiple cues to direct to task)   Following Commands: Follows one step commands consistently;Follows one step commands with increased time   Awareness: Emergent Problem Solving: Slow processing;Difficulty sequencing;Requires verbal cues;Requires tactile cues                Home Living Family/patient expects to be discharged to:: Private residence Living Arrangements: Alone   Type of Home: Apartment Home Access: Stairs to enter Entrance Stairs-Number of Steps: 20 Entrance Stairs-Rails: Right Home Layout: One level     Bathroom Shower/Tub: Teacher, early years/pre: Standard     Home Equipment: Environmental consultant - 2 wheels;Bedside commode;Shower seat   Additional Comments: pt reports that she sponge bathes      Prior Functioning/Environment Level of Independence: Independent  with assistive  device(s);Needs assistance  Gait / Transfers Assistance Needed: uses rolling walker ADL's / Homemaking Assistance Needed: has help with cleaning, cooks a little for herself, performs bird baths instead of getting into shower        OT Diagnosis: Generalized weakness;Cognitive deficits   OT Problem List: Decreased strength;Decreased activity tolerance;Impaired balance (sitting and/or standing);Decreased cognition;Decreased safety awareness   OT Treatment/Interventions: Self-care/ADL training;Therapeutic exercise;Energy conservation;DME and/or AE instruction;Therapeutic activities;Patient/family education;Balance training;Cognitive remediation/compensation    OT Goals(Current goals can be found in the care plan section) Acute Rehab OT Goals Patient Stated Goal: to go to rehab OT Goal Formulation: With patient Time For Goal Achievement: 04/08/14 Potential to Achieve Goals: Good ADL Goals Pt Will Perform Grooming: with min assist;standing (for 5 minutes) Pt Will Perform Lower Body Bathing: with min guard assist;sit to/from stand Pt Will Perform Lower Body Dressing: with min guard assist;sit to/from stand Pt Will Transfer to Toilet: with min guard assist;stand pivot transfer;bedside commode Pt Will Perform Toileting - Clothing Manipulation and hygiene: with min guard assist;sit to/from stand  OT Frequency: Min 2X/week   Barriers to D/C: Decreased caregiver support          Co-evaluation PT/OT/SLP Co-Evaluation/Treatment: Yes Reason for Co-Treatment: For patient/therapist safety   OT goals addressed during session: ADL's and self-care      End of Session Equipment Utilized During Treatment: Gait belt;Rolling walker Nurse Communication: Other (comment) (clear, gelatinous BM in [facility])  Activity Tolerance: Patient tolerated treatment well Patient left: in chair;with call bell/phone within reach   Time: 1155-1222 OT Time Calculation (min): 27 min Charges:  OT General Charges $OT  Visit: 1 Procedure OT Evaluation $Initial OT Evaluation Tier I: 1 Procedure G-Codes:    Juluis Rainier 03-31-2014, 1:36 PM   Cyndie Chime, OTR/L Occupational Therapist [phone] (pager)

## 2014-03-25 NOTE — Progress Notes (Signed)
[address] TEAM 1 - Stepdown/ICU TEAM Progress Note  Amani Marseille XTK:[id number] DOB: 1924/11/21 DOA: 03/21/2014 PCP: Thressa Sheller, MD  Admit HPI / Brief Narrative: 81 year old WF PMHx remote treated breast cancer, hypertension, diabetes, and coronary artery disease identified in July 2015 comp related by ischemic cardiomyopathy. S/P CABG 4 vessel, rehabilitated and is now living back at home. She states that about 2 weeks ago she suffered a fall with an injury to her coccyx. She does not believe she lost consciousness but instead it was a mechanical fall. Since that time she has not been doing as well. In particular she states that her by mouth intake has decreased. The last 4-5 days prior to admission she was barely eating or drinking anything, largely due to lack of appetite. She denied any fevers, upper respiratory symptoms including cough or mucus, chest discomfort, nausea vomiting diarrhea, or dysuria. She states that she was having normal bowel movements but that she has not needed to urinate since early 03/19/13. She has acutely more lethargic and weak so she presented to the emergency department. In the ED she was severely bradycardic (sinus) and hypotensive. Labs identified acute renal failure with associated hyperkalemia and metabolic acidosis. She is admitted for further care 1/10. She states that her CODE STATUS should be DO NOT RESUSCITATE and that she is not interested in hemodialysis. Her blood pressure and heart rate are improving slightly with volume resuscitation.   HPI/Subjective: 1/14 patient states negative LOC, remembers the whole incident. States believes she tripped over something in the kitchen and fell and was unable to get off the floor. States the next day neighbor found her down on the floor helped her up at which time she contacted her son. Patient's son insisted that she be transported to the ED. Currently A/O 4, negative CP, negative SOB. States earlier this morning felt  somewhat lightheaded  Assessment/Plan: Sinus bradycardia -Now off amio and BB;resolved  - may resume low dose BB prior to d/c if patient's BP/heart rate tolerates   Cardiogenic shock / Hypovolemic shock -Resolved -Continue normal saline at 67ml/hr  Elevated troponin -Most likely secondary to cardiogenic shock/demand ischemia however will continue to trend  -Cardiology following   Parox Afib post CABG -Rate controlled off of amiodarone and beta blocker  -Check TSH   CAD s/p CABG -Will attempt to restart low dose Coreg (3.125 mg BID)  - cont ASA 325 mg daily  Chronic ischemic cardiomyopathy -EF as low as 20-25% in July 2015 but has since recovered - 55% by echo this admission - NO ACE/ARB due to acute renal failure  Pulmonary hypertension -See CAD S/P CABG  Acute Renal failure -ATN due to hypovolemic shock,improving w/ volume - Nephrology following    Hyperkalemia > Hypokalemia -Gently replace and follow trend  -Potassium goal> 4  Hypomagnesemia -Magnesium IV 2 gm -Magnesium goal> 2  Hyponatremia -Likely hypovolemic in nature, resolved  Lactic acidosis -Cont volume resuscitation - recheck lactate in AM   GERD  Anemia -Stable; transfuse for hemoglobin goal< 8  Pan sensitive E coli UTI -Cont rocephin to complete 7 days of abx - no evidence of bacteremia   Toxic metabolic encephalopathy -Most likely multifactorial to include hypovolemia, acute renal failure, sepsis  -Patient is A/O 4; resolved   Hypothyroidism  -Cont Synthroid 112 g  -Obtain TSH   Code Status: DNR  Family Communication: no family present at time of exam Disposition Plan: Resolution bradycardia    Consultants: Dr. Fransico Him (Cardiology)  Dr. Edrick Oh (  Nephrology)     Procedure/Significant Events: 1/11 echocardiogram; - Left ventricle: Inferobasal hypokinesis moderate LVH.  -LVEF=55%. - Pulmonary arteries: PA peak pressure: 46 mm Hg (S).    Culture 1/10 blood  right arm 2 NGTD 1/10 urine positive Escherichia coli pansensitive 1/10 MRSA by PCR negative  Antibiotics: Rocephin 1/12 > Zosyn 1/10 > stopped1/11 Vanc 1/10>> stopped 1/10  DVT prophylaxis: SQ heparin    Devices NA   LINES / TUBES:  NA    Continuous Infusions: . sodium chloride 50 mL/hr at 03/25/14 0600    Objective: VITAL SIGNS: Temp: 98 F (36.7 C) (01/14 1158) Temp Source: Oral (01/14 1158) BP: 137/75 mmHg (01/14 1158) Pulse Rate: 147 (01/14 1158) SPO2; FIO2:   Intake/Output Summary (Last 24 hours) at 03/25/14 1455 Last data filed at 03/25/14 0600  Gross per 24 hour  Intake  897.5 ml  Output    150 ml  Net  747.5 ml     Exam: General: A/O 4, NAD, sitting in chair comfortably, No acute respiratory distress Lungs: Clear to auscultation bilaterally without wheezes or crackles Cardiovascular: Regular rate and rhythm without murmur gallop or rub normal S1 and S2 Abdomen: Nontender, nondistended, soft, bowel sounds positive, no rebound, no ascites, no appreciable mass Extremities: No significant cyanosis, clubbing, or edema bilateral lower extremities  Data Reviewed: Basic Metabolic Panel:  Recent Labs Lab 03/22/14 2030 03/23/14 0311 03/24/14 0209 03/24/14 1454 03/25/14 0310  NA 133* 134* 133* 136 140  K 5.1 4.2 2.8* 2.9* 2.9*  CL 91* 90* 85* 84* 91*  CO2 14* 18* 35* 38* 41*  GLUCOSE 135* 139* 130* 239* 152*  BUN 83* 79* 72* 63* 50*  CREATININE 4.62* 4.43* 2.90* 2.13* 1.46*  CALCIUM 8.5 8.2* 7.4* 7.6* 8.1*  MG  --   --  1.8  --  1.7  PHOS  --   --  4.7*  --   --    Liver Function Tests:  Recent Labs Lab 03/21/14 1045 03/25/14 0310  AST 54* 42*  ALT 30 28  ALKPHOS 37* 40  BILITOT 0.7 0.3  PROT 4.9* 4.5*  ALBUMIN 2.6* 2.3*   No results for input(s): LIPASE, AMYLASE in the last 168 hours. No results for input(s): AMMONIA in the last 168 hours. CBC:  Recent Labs Lab 03/21/14 1045 03/22/14 0457 03/24/14 0209 03/25/14 0310    WBC 12.4* 16.5* 11.2* 8.3  HGB 10.5* 10.8* 10.0* 10.5*  HCT 32.6* 33.9* 28.7* 31.7*  MCV 101.6* 103.4* 98.0 100.6*  PLT 340 338 247 250   Cardiac Enzymes:  Recent Labs Lab 03/21/14 1045 03/21/14 1631 03/21/14 2105 03/22/14 0457 03/22/14 1055  CKTOTAL  --   --   --   --  100  TROPONINI 0.81* 0.51* 0.49* 0.48* 0.48*   BNP (last 3 results)  Recent Labs  10/14/13 0350  PROBNP 2044.0*   CBG:  Recent Labs Lab 03/21/14 2005  GLUCAP 83    Recent Results (from the past 240 hour(s))  Blood culture (routine x 2)     Status: None (Preliminary result)   Collection Time: 03/21/14 12:40 PM  Result Value Ref Range Status   Specimen Description BLOOD RIGHT ARM  Final   Special Requests BOTTLES DRAWN AEROBIC AND ANAEROBIC 10 CC  Final   Culture   Final           BLOOD CULTURE RECEIVED NO GROWTH TO DATE CULTURE WILL BE HELD FOR 5 DAYS BEFORE ISSUING A FINAL NEGATIVE REPORT Performed at [facility]    Report Status PENDING  Incomplete  Blood culture (routine x 2)     Status: None (Preliminary result)   Collection Time: 03/21/14  1:00 PM  Result Value Ref Range Status   Specimen Description BLOOD RIGHT ARM  Final   Special Requests BOTTLES DRAWN AEROBIC AND ANAEROBIC 10 CC  Final   Culture   Final           BLOOD CULTURE RECEIVED NO GROWTH TO DATE CULTURE WILL BE HELD FOR 5 DAYS BEFORE ISSUING A FINAL NEGATIVE REPORT Performed at [facility]    Report Status PENDING  Incomplete  Urine culture     Status: None   Collection Time: 03/21/14  1:24 PM  Result Value Ref Range Status   Specimen Description URINE, CATHETERIZED  Final   Special Requests NONE  Final   Colony Count   Final    >=100,000 COLONIES/ML Performed at [facility]    Culture   Final    ESCHERICHIA COLI Performed at [facility]    Report Status 03/24/2014 FINAL  Final   Organism ID, Bacteria ESCHERICHIA COLI  Final      Susceptibility   Escherichia coli - MIC*     AMPICILLIN <=2 SENSITIVE Sensitive     CEFAZOLIN <=4 SENSITIVE Sensitive     CEFTRIAXONE <=1 SENSITIVE Sensitive     CIPROFLOXACIN <=0.25 SENSITIVE Sensitive     GENTAMICIN <=1 SENSITIVE Sensitive     LEVOFLOXACIN <=0.12 SENSITIVE Sensitive     NITROFURANTOIN <=16 SENSITIVE Sensitive     TOBRAMYCIN <=1 SENSITIVE Sensitive     TRIMETH/SULFA <=20 SENSITIVE Sensitive     PIP/TAZO <=4 SENSITIVE Sensitive     * ESCHERICHIA COLI  MRSA PCR Screening     Status: None   Collection Time: 03/21/14 10:57 PM  Result Value Ref Range Status   MRSA by PCR NEGATIVE NEGATIVE Final    Comment:        The GeneXpert MRSA Assay (FDA approved for NASAL specimens only), is one component of a comprehensive MRSA colonization surveillance program. It is not intended to diagnose MRSA infection nor to guide or monitor treatment for MRSA infections.      Studies:  Recent x-ray studies have been reviewed in detail by the Attending Physician  Scheduled Meds:  Scheduled Meds: . aspirin EC  325 mg Oral Daily  . atorvastatin  80 mg Oral Daily  . cefTRIAXone (ROCEPHIN)  IV  1 g Intravenous Q24H  . heparin  5,000 Units Subcutaneous 3 times per day  . lactose free nutrition  237 mL Oral BID BM  . levothyroxine  112 mcg Oral QAC breakfast  . pantoprazole  40 mg Oral Daily    Time spent on care of this patient: 40 mins   Allie Bossier , MD   [facility] Office  [phone] Pager - [phone]  On-Call/Text Page:      [email or web address]      password [email or web address]  If 7PM-7AM, please contact night-coverage [email or web address] Password [email or web address] 03/25/2014, 2:55 PM   LOS: 4 days

## 2014-03-25 NOTE — Progress Notes (Signed)
Co2 41 was 38 the day before will continue to monitor

## 2014-03-25 NOTE — Evaluation (Signed)
Physical Therapy Evaluation Patient Details Name: Marissa Jefferson MRN: [id number] DOB: Oct 28, 1924 Today's Date: 03/25/2014   History of Present Illness  Aneisa Jefferson is an 81 y.o. Female admitted 1/10/16with lethargy and weakness. PMH of CAD, CABG x4 (2015), ischemic cardiomyopathy, postop A. fib, DM, HTN. Pt was bradycardic and hypotensive in ED.   Clinical Impression  Patient demonstrates deficits in functional mobility as indicated below. Will benefit from continued skilled PT to address deficits and maximize function. Given patient current mobility level (+2 assist) and reported history of falls, feel patient would benefit from [facility] upon acute discharge. Patient in agreement states she had been to [facility] inn the past. Will continue to see and progress as tolerated.  VSS throughout session.    Follow Up Recommendations SNF;Supervision/Assistance - 24 hour    Equipment Recommendations   (TBD)    Recommendations for Other Services       Precautions / Restrictions Precautions Precautions: Fall Restrictions Weight Bearing Restrictions: No      Mobility  Bed Mobility Overal bed mobility: Needs Assistance Bed Mobility: Supine to Sit     Supine to sit: Min guard;HOB elevated     General bed mobility comments: increased time to perform  Transfers Overall transfer level: Needs assistance Equipment used: Rolling walker (2 wheeled) Transfers: Sit to/from [facility] Sit to Stand: Mod assist;+2 safety/equipment Stand pivot transfers: Mod assist;+2 safety/equipment       General transfer comment: Pt initially min (A), however fatigues quickly and requires mod (A) for mobility.   Ambulation/Gait                Stairs            Wheelchair Mobility    Modified Rankin (Stroke Patients Only)       Balance Overall balance assessment: Needs assistance;History of Falls Sitting-balance support: Feet supported Sitting balance-Leahy Scale:  Fair     Standing balance support: Bilateral upper extremity supported;During functional activity Standing balance-Leahy Scale: Poor Standing balance comment: increased posterior lean, cues to facilitate BOS and assist to maintain stability                             Pertinent Vitals/Pain Pain Assessment: No/denies pain (did grimace during transfer)    Home Living Family/patient expects to be discharged to:: Private residence Living Arrangements: Alone   Type of Home: Apartment Home Access: Stairs to enter Entrance Stairs-Rails: Right Entrance Stairs-Number of Steps: 20 Home Layout: One level Home Equipment: Walker - 2 wheels;Bedside commode;Shower seat Additional Comments: pt reports that she sponge bathes    Prior Function Level of Independence: Independent with assistive device(s);Needs assistance   Gait / Transfers Assistance Needed: uses rolling walker  ADL's / Homemaking Assistance Needed: has help with cleaning, cooks a little for herself, performs bird baths instead of getting into shower        Hand Dominance   Dominant Hand: Right    Extremity/Trunk Assessment   Upper Extremity Assessment: Generalized weakness           Lower Extremity Assessment: Generalized weakness         Communication   Communication: No difficulties  Cognition Arousal/Alertness: Awake/alert Behavior During Therapy: WFL for tasks assessed/performed Overall Cognitive Status: No family/caregiver present to determine baseline cognitive functioning Area of Impairment: Attention;Following commands;Awareness;Problem solving   Current Attention Level: Focused (multiple cues to direct to task)   Following Commands: Follows one step commands  consistently;Follows one step commands with increased time   Awareness: Emergent Problem Solving: Slow processing;Difficulty sequencing;Requires verbal cues;Requires tactile cues      General Comments      Exercises         Assessment/Plan    PT Assessment Patient needs continued PT services  PT Diagnosis Difficulty walking;Abnormality of gait;Generalized weakness   PT Problem List Decreased strength;Decreased activity tolerance;Decreased balance;Decreased mobility;Decreased knowledge of use of DME;Decreased safety awareness;Pain  PT Treatment Interventions DME instruction;Gait training;Stair training;Functional mobility training;Therapeutic activities;Therapeutic exercise;Balance training;Patient/family education   PT Goals (Current goals can be found in the Care Plan section) Acute Rehab PT Goals Patient Stated Goal: to go to rehab PT Goal Formulation: With patient Time For Goal Achievement: 04/08/14 Potential to Achieve Goals: Good    Frequency Min 2X/week   Barriers to discharge Inaccessible home environment;Decreased caregiver support 20 steps to enter the home    Co-evaluation PT/OT/SLP Co-Evaluation/Treatment: Yes Reason for Co-Treatment: For patient/therapist safety PT goals addressed during session: Mobility/safety with mobility OT goals addressed during session: ADL's and self-care       End of Session Equipment Utilized During Treatment: Gait belt Activity Tolerance: Patient tolerated treatment well;Patient limited by fatigue Patient left: in chair;with call bell/phone within reach Nurse Communication: Mobility status         Time: 7915-0569 PT Time Calculation (min) (ACUTE ONLY): 27 min   Charges:   PT Evaluation $Initial PT Evaluation Tier I: 1 Procedure PT Treatments $Therapeutic Activity: 8-22 mins   PT G CodesDuncan Dull 04/23/2014, 2:33 PM Alben Deeds, [place] DPT  [phone]

## 2014-03-25 NOTE — Progress Notes (Signed)
Subjective: No chest pain, no SOB worried about her hair.  Objective: Vital signs in last 24 hours: Temp:  [97.8 F (36.6 C)-98.5 F (36.9 C)] 97.8 F (36.6 C) (01/14 0736) Pulse Rate:  [66-76] 66 (01/14 0736) Resp:  [11-17] 11 (01/14 0736) BP: (110-157)/(46-70) 157/70 mmHg (01/14 0736) SpO2:  [95 %-100 %] 100 % (01/14 0736) Weight:  [147 lb 6.4 oz (66.86 kg)] 147 lb 6.4 oz (66.86 kg) (01/14 0500) Weight change: 4 lb 5.1 oz (1.96 kg) Last BM Date:  (flexiseal) Intake/Output from previous day: +247 01/13 0701 - 01/14 0700 In: 897.5 [P.O.:120; I.V.:777.5] Out: 650 [Urine:650] Intake/Output this shift:    PE: General:Pleasant affect, NAD, states she is "dis-combulated"  Skin:Warm and dry, brisk capillary refill HEENT:normocephalic, sclera clear, mucus membranes moist Heart:S1S2 RRR without murmur, gallup, rub or click Lungs:clear without rales, rhonchi, or wheezes GUY:QIHK, non tender, + BS, do not palpate liver spleen or masses Ext:no lower ext edema, 2+ pedal pulses, 2+ radial pulses Neuro:alert and oriented, MAE, follows commands, + facial symmetry  tele:  SR  Lab Results:  Recent Labs  03/24/14 0209 03/25/14 0310  WBC 11.2* 8.3  HGB 10.0* 10.5*  HCT 28.7* 31.7*  PLT 247 250   BMET  Recent Labs  03/24/14 1454 03/25/14 0310  NA 136 140  K 2.9* 2.9*  CL 84* 91*  CO2 38* 41*  GLUCOSE 239* 152*  BUN 63* 50*  CREATININE 2.13* 1.46*  CALCIUM 7.6* 8.1*    Recent Labs  03/22/14 1055  TROPONINI 0.48*    Lab Results  Component Value Date   CHOL 148 10/07/2013   HDL 40 10/07/2013   LDLCALC 86 10/07/2013   TRIG 111 10/07/2013   CHOLHDL 3.7 10/07/2013   Lab Results  Component Value Date   HGBA1C 6.8* 10/07/2013     No results found for: TSH  Hepatic Function Panel  Recent Labs  03/25/14 0310  PROT 4.5*  ALBUMIN 2.3*  AST 42*  ALT 28  ALKPHOS 40  BILITOT 0.3   No results for input(s): CHOL in the last 72 hours. No results  for input(s): PROTIME in the last 72 hours.     Studies/Results: No results found.  Medications: I have reviewed the patient's current medications. Scheduled Meds: . aspirin EC  325 mg Oral Daily  . atorvastatin  80 mg Oral Daily  . cefTRIAXone (ROCEPHIN)  IV  1 g Intravenous Q24H  . heparin  5,000 Units Subcutaneous 3 times per day  . lactose free nutrition  237 mL Oral BID BM  . levothyroxine  112 mcg Oral QAC breakfast  . pantoprazole  40 mg Oral Daily   Continuous Infusions: . sodium chloride 50 mL/hr at 03/25/14 0600   PRN Meds:.acetaminophen, ondansetron  Assessment/Plan:  81 y.o. female with a past medical history significant for diabetes, HTN, nonsmoker with history of left breast cancer treated with radiation-lumpectomy 15 years ago. She was seen for CHF 10/05/13. Her echo showed an EF of 20-25%. She was admitted for outpatient right and left heart catheterization for symptoms of class III CHF on 10/06/13. This revealed severe 3V CAD and she underwent CABG x 4 10/09/13. Post op she had PAF Rx'd with Amiodarone. She was placed in a skilled nursing facility for rehabilitation but gradually improved. She was seen in the office on 11/17/2013 and was doing well at that time. She was subsequently seen in follow-up by Dr. Roxan Hockey, who performed her CABG.  She was doing very well to time.  Patient was admitted with acute shock secondary to several days of poor PO intake, UTI, dehydration, and weakness. She was bradycardic and hypotensive in acute renal failure. Mildly elevated trop most likely secondary to demand ischemia in setting of hypotension/bradycardia and acute renal failure.   1. Acute renal failure/Ischemic ATN: In setting of dehydration, hypotn, acei/diuretic. Improving-Cr at 1.46 Nephrology f0llowing. Acei/hctz on hold.  2. Hypotension: BP's stable in 90's to low 100's. Bb/acei/hctz remain on hold.  3. CAD/troponin elevation: S/p CABG. No chest pain.  Trop peaked @ 0.81 with flat trend - suspect 2/2 demand ischemia in setting of # 1/2. No further ischemic eval @ this time. Bb/acei on hold. Continue ASA and statin.  4. Post-op Afib: Was on amio, which has been d/c'd in setting of bradycardia on presentation. Stable rhythm. Leave off amio going forward. Would ideally like to resume bb once BP stable.  5. H/O ICM/Chronic systolic CHF: EF as low as 20-25% in July 2015 but has since recovered - 55% by echo this admission. Was on bb/acei @ home but both on hold in setting of hypotension, bradycardia, and acute renal failure. Would hope to be able to resume in the future but will have to wait until full recovery from renal failure with resolution of hypotension (bradycardia has resolved).  6. UTI: Rocephin per IM.  7. DM: Per IM.  8. Hyperkalemia/Hypokalemia: K 2.9 this AM. will replace with 40 meq today.    Follow.    LOS: 4 days   Time spent with pt. :15 minutes. [facility] R  Nurse Practitioner Certified Pager [phone] or after 5pm and on weekends call [phone] 03/25/2014, 10:18 AM  I have personally seen and examined patient and agree with note as outlined above by Cecilie Kicks NP.  Bradycardia resolved off BB and Amio.  Would like to restart BB on discharge for CAD and ischemic DCM if BP will tolerate.  Signed: Fransico Him, MD [facility] 03/25/2014

## 2014-03-25 NOTE — Clinical Social Work Placement (Addendum)
Clinical Social Work Department CLINICAL SOCIAL WORK PLACEMENT NOTE 03/25/2014  Patient:  Marissa Jefferson, Marissa Jefferson  Account Number:  [id number] [place] date:  03/21/2014  Clinical Social Worker:  Paulette Blanch [place] Sensabaugh, LCSWA  Date/time:  03/25/2014 03:13 PM  Clinical Social Work is seeking post-discharge placement for this patient at the following level of care:   SKILLED NURSING   (*CSW will update this form in Epic as items are completed)   03/25/2014  Patient/family provided with [place] Department of Clinical Social Work's list of facilities offering this level of care within the geographic area requested by the patient (or if unable, by the patient's family).  03/25/2014  Patient/family informed of their freedom to choose among providers that offer the needed level of care, that participate in Medicare, Medicaid or managed care program needed by the patient, have an available bed and are willing to accept the patient.  03/25/2014  Patient/family informed of [facility]' ownership interest in [facility], as well as of the fact that they are under no obligation to receive care at this facility.  PASARR submitted to EDS on  PASARR number received on   FL2 transmitted to all facilities in geographic area requested by pt/family on  03/25/2014 FL2 transmitted to all facilities within larger geographic area on 03/25/2014  Patient informed that his/her managed care company has contracts with or will negotiate with  certain facilities, including the following:     Patient/family informed of bed offers received: 03/26/2014  Patient chooses bed at  Physician recommends and patient chooses bed at    Patient to be transferred to  on   Patient to be transferred to facility by  Patient and family notified of transfer on  Name of family member notified:    The following physician request were entered in Epic:   Additional Comments:   [place], MSW, [place]

## 2014-03-26 DIAGNOSIS — N179 Acute kidney failure, unspecified: Secondary | ICD-10-CM | POA: Insufficient documentation

## 2014-03-26 DIAGNOSIS — R7989 Other specified abnormal findings of blood chemistry: Secondary | ICD-10-CM

## 2014-03-26 LAB — COMPREHENSIVE METABOLIC PANEL
ALK PHOS: 41 U/L (ref 39–117)
ALT: 26 U/L (ref 0–35)
ANION GAP: 5 (ref 5–15)
AST: 37 U/L (ref 0–37)
Albumin: 2.4 g/dL — ABNORMAL LOW (ref 3.5–5.2)
BUN: 25 mg/dL — ABNORMAL HIGH (ref 6–23)
CALCIUM: 8.2 mg/dL — AB (ref 8.4–10.5)
CHLORIDE: 96 meq/L (ref 96–112)
CO2: 36 mmol/L — ABNORMAL HIGH (ref 19–32)
CREATININE: 0.85 mg/dL (ref 0.50–1.10)
GFR calc Af Amer: 69 mL/min — ABNORMAL LOW (ref >90)
GFR calc non Af Amer: 60 mL/min — ABNORMAL LOW (ref >90)
Glucose, Bld: 130 mg/dL — ABNORMAL HIGH (ref 70–99)
Potassium: 3.6 mmol/L (ref 3.5–5.1)
Sodium: 137 mmol/L (ref 135–145)
TOTAL PROTEIN: 4.9 g/dL — AB (ref 6.0–8.3)
Total Bilirubin: 0.5 mg/dL (ref 0.3–1.2)

## 2014-03-26 LAB — CBC WITH DIFFERENTIAL/PLATELET
Basophils Absolute: 0 10*3/uL (ref 0.0–0.1)
Basophils Relative: 0 % (ref 0–1)
EOS ABS: 0.4 10*3/uL (ref 0.0–0.7)
Eosinophils Relative: 5 % (ref 0–5)
HCT: 33.8 % — ABNORMAL LOW (ref 36.0–46.0)
Hemoglobin: 11.1 g/dL — ABNORMAL LOW (ref 12.0–15.0)
LYMPHS ABS: 1.3 10*3/uL (ref 0.7–4.0)
Lymphocytes Relative: 16 % (ref 12–46)
MCH: 32.4 pg (ref 26.0–34.0)
MCHC: 32.8 g/dL (ref 30.0–36.0)
MCV: 98.5 fL (ref 78.0–100.0)
MONO ABS: 0.8 10*3/uL (ref 0.1–1.0)
Monocytes Relative: 11 % (ref 3–12)
NEUTROS ABS: 5.2 10*3/uL (ref 1.7–7.7)
NEUTROS PCT: 68 % (ref 43–77)
Platelets: 238 10*3/uL (ref 150–400)
RBC: 3.43 MIL/uL — AB (ref 3.87–5.11)
RDW: 13.8 % (ref 11.5–15.5)
WBC: 7.7 10*3/uL (ref 4.0–10.5)

## 2014-03-26 LAB — TROPONIN I
Troponin I: 0.24 ng/mL — ABNORMAL HIGH (ref <0.031)
Troponin I: 0.22 ng/mL — ABNORMAL HIGH (ref <0.031)

## 2014-03-26 LAB — MAGNESIUM: MAGNESIUM: 1.7 mg/dL (ref 1.5–2.5)

## 2014-03-26 LAB — TSH: TSH: 10.01 u[IU]/mL — AB (ref 0.350–4.500)

## 2014-03-26 LAB — LACTIC ACID, PLASMA: LACTIC ACID, VENOUS: 1.1 mmol/L (ref 0.5–2.2)

## 2014-03-26 MED ORDER — LEVOTHYROXINE SODIUM 137 MCG PO TABS
137.0000 ug | ORAL_TABLET | Freq: Every day | ORAL | Status: DC
  Administered 2014-03-27 – 2014-03-30 (×4): 137 ug via ORAL
  Filled 2014-03-26 (×5): qty 1

## 2014-03-26 MED ORDER — POTASSIUM CHLORIDE CRYS ER 20 MEQ PO TBCR
40.0000 meq | EXTENDED_RELEASE_TABLET | Freq: Once | ORAL | Status: AC
  Administered 2014-03-26: 40 meq via ORAL
  Filled 2014-03-26: qty 2

## 2014-03-26 MED ORDER — MAGNESIUM SULFATE 50 % IJ SOLN
3.0000 g | Freq: Once | INTRAMUSCULAR | Status: AC
  Administered 2014-03-26: 3 g via INTRAVENOUS
  Filled 2014-03-26: qty 6

## 2014-03-26 NOTE — Progress Notes (Signed)
Medicare Important Message given? YES  (If response is "NO", the following Medicare IM given date fields will be blank)  Date Medicare IM given: 03/26/14  Medicare IM given by:  Jaire Pinkham  

## 2014-03-26 NOTE — Clinical Social Work Note (Addendum)
CSW spoke with the pt's daughter [name]. [name] reported due to her mother being non-compliant she will be moving the pt to [place] to live with her. [name] requested the CSW to extended the SNF search to [facility]. CSW spoke with the admission director at [facility]. CSW is awaiting to bed offer. CSW will continue to follow this pt.   Addendum: [facility] offered the pt at bed. Mardene Celeste at [facility] will started the insurance authorization today. The insurance list on the pt's face sheet is incorrect. The pt has a managed Medicare. CSW just was made aware of this, this evening. Mardene Celeste at [facility] will take the pt on Monday.   [place], MSW, [place]

## 2014-03-26 NOTE — Progress Notes (Signed)
Subjective: No complaints, still confused at times.  Objective: Vital signs in last 24 hours: Temp:  [97.3 F (36.3 C)-98.3 F (36.8 C)] 98.3 F (36.8 C) (01/15 0440) Pulse Rate:  [73-147] 89 (01/15 0440) Resp:  [13-20] 20 (01/15 0440) BP: (133-164)/(73-80) 164/79 mmHg (01/15 0440) SpO2:  [99 %-100 %] 100 % (01/15 0440) Weight:  [147 lb 6.4 oz (66.86 kg)] 147 lb 6.4 oz (66.86 kg) (01/15 0440) Weight change: 0 lb (0 kg) Last BM Date:  (flexiseal) Intake/Output from previous day:  +450 01/14 0701 - 01/15 0700 In: 700 [I.V.:600; IV Piggyback:100] Out: 250 [Urine:250] Intake/Output this shift:    PE: General:Pleasant affect, NAD Skin:Warm and dry, brisk capillary refill HEENT:normocephalic, sclera clear, mucus membranes moist Heart:S1S2 RRR with osft systolic murmur, no gallup, rub or click Lungs:clear without rales, rhonchi, or wheezes OIB:BCWU, non tender, + BS, do not palpate liver spleen or masses Ext:no lower ext edema, 2+ pedal pulses, 2+ radial pulses Neuro:alert and oriented, MAE, follows commands, + facial symmetry TELE:  SR    Lab Results:  Recent Labs  03/25/14 0310 03/26/14 0505  WBC 8.3 7.7  HGB 10.5* 11.1*  HCT 31.7* 33.8*  PLT 250 238   BMET  Recent Labs  03/25/14 1608 03/26/14 0505  NA 137 137  K 3.6 3.6  CL 93* 96  CO2 37* 36*  GLUCOSE 240* 130*  BUN 37* 25*  CREATININE 1.14* 0.85  CALCIUM 8.1* 8.2*    Recent Labs  03/25/14 2310 03/26/14 0505  TROPONINI 0.24* 0.22*    Lab Results  Component Value Date   CHOL 148 10/07/2013   HDL 40 10/07/2013   LDLCALC 86 10/07/2013   TRIG 111 10/07/2013   CHOLHDL 3.7 10/07/2013   Lab Results  Component Value Date   HGBA1C 6.8* 10/07/2013     Lab Results  Component Value Date   TSH 10.010* 03/26/2014    Hepatic Function Panel  Recent Labs  03/26/14 0505  PROT 4.9*  ALBUMIN 2.4*  AST 37  ALT 26  ALKPHOS 41  BILITOT 0.5   No results for input(s): CHOL in the  last 72 hours. No results for input(s): PROTIME in the last 72 hours.     Studies/Results: No results found.  Medications: I have reviewed the patient's current medications. Scheduled Meds: . aspirin EC  325 mg Oral Daily  . atorvastatin  80 mg Oral Daily  . carvedilol  3.125 mg Oral BID WC  . cefTRIAXone (ROCEPHIN)  IV  1 g Intravenous Q24H  . heparin  5,000 Units Subcutaneous 3 times per day  . lactose free nutrition  237 mL Oral BID BM  . levothyroxine  112 mcg Oral QAC breakfast  . pantoprazole  40 mg Oral Daily   Continuous Infusions: . sodium chloride 50 mL/hr at 03/25/14 0600   PRN Meds:.acetaminophen, ondansetron    Assessment/Plan: 81 y.o. female with a past medical history significant for diabetes, HTN, nonsmoker with history of left breast cancer treated with radiation-lumpectomy 15 years ago. She was seen for CHF 10/05/13. Her echo showed an EF of 20-25%. She was admitted for outpatient right and left heart catheterization for symptoms of class III CHF on 10/06/13. This revealed severe 3V CAD and she underwent CABG x 4 10/09/13. Post op she had PAF Rx'd with Amiodarone. She was placed in a skilled nursing facility for rehabilitation but gradually improved. She was seen in the office on 11/17/2013 and was doing  well at that time. She was subsequently seen in follow-up by Dr. Roxan Hockey, who performed her CABG. She was doing very well to time.  Patient was admitted with acute shock secondary to several days of poor PO intake, UTI, dehydration, and weakness. She was bradycardic and hypotensive in acute renal failure. Mildly elevated trop most likely secondary to demand ischemia in setting of hypotension/bradycardia and acute renal failure. now improved.  1. Acute renal failure/Ischemic ATN: In setting of dehydration, hypotn, acei/diuretic. Improving-Cr at now 0.85 down from4.6,   Nephrology f0llowing. Acei/hctz on hold.  2. Hypotension: BP's stable in 164/79 to  130/80's. acei/hctz remain on hold.  HR 96 resumed BB- just rec'd first dose of coreg.  3. CAD/troponin elevation: S/p CABG. No chest pain. Trop peaked @ 0.81 with flat trend - suspect 2/2 demand ischemia in setting of # 1/2. No further ischemic eval @ this time. acei on hold. Continue ASA and statin.  Coreg just started this am.  4. Post-op Afib: Was on amio, which has been d/c'd in setting of bradycardia on presentation. Stable rhythm. Leave off amio going forward. She is more than 3 months out from CABG when she had post op afib.  BB resumed this am.  5. H/O ICM/Chronic systolic CHF: EF as low as 20-25% in July 2015 but has since recovered - 55% by echo this admission. Was on bb/acei @ home but both were on hold in setting of hypotension-resolved, bradycardia-resolved, and acute renal failure-improved though would hold ACE/ARB for now. Would hope to be able to resume in the future but will have to wait until full recovery from renal failure.  6. UTI: Rocephin per IM.  7. DM: Per IM.  8. Hyperkalemia/Hypokalemia: K 2.9 yesterday, now 3.6. Follow.  She has follow up with Dr. Jerilynn Mages. Croitoru on the 3rd of Feb.     LOS: 5 days   Time spent with pt. :15 minutes. [facility] R  Nurse Practitioner Certified Pager [phone] or after 5pm and on weekends call [phone] 03/26/2014, 9:56 AM  I have personally seen and examined patient and agree with note as outlined by Cecilie Kicks NP.  Patient's renal failure has resolved and bradycardia has resolved.  Received her first dose of Coreg this am.  Will see how HR responds.  ACE I on hold due to renal failure but would like to restart prior to discharge if renal function remains stable.  Signed: Fransico Him, MD [facility] 03/26/2014

## 2014-03-26 NOTE — Progress Notes (Signed)
[place] TEAM 1 - Stepdown/ICU TEAM Progress Note  Marissa Jefferson MCN:[id number] DOB: August 14, 1924 DOA: 03/21/2014 PCP: Thressa Sheller, MD  Admit HPI / Brief Narrative: 81 year old WF PMHx remote treated breast cancer, hypertension, diabetes, and coronary artery disease identified in July 2015 comp related by ischemic cardiomyopathy. S/P CABG 4 vessel, rehabilitated and is now living back at home. She states that about 2 weeks ago she suffered a fall with an injury to her coccyx. She does not believe she lost consciousness but instead it was a mechanical fall. Since that time she has not been doing as well. In particular she states that her by mouth intake has decreased. The last 4-5 days prior to admission she was barely eating or drinking anything, largely due to lack of appetite. She denied any fevers, upper respiratory symptoms including cough or mucus, chest discomfort, nausea vomiting diarrhea, or dysuria. She states that she was having normal bowel movements but that she has not needed to urinate since early 03/19/13. She has acutely more lethargic and weak so she presented to the emergency department. In the ED she was severely bradycardic (sinus) and hypotensive. Labs identified acute renal failure with associated hyperkalemia and metabolic acidosis. She is admitted for further care 1/10. She states that her CODE STATUS should be DO NOT RESUSCITATE and that she is not interested in hemodialysis. Her blood pressure and heart rate are improving slightly with volume resuscitation.   HPI/Subjective: 1/15 Currently A/O 4, negative CP, negative SOB. RN states patient does have waxing and waning periods of confusion throughout day.   Assessment/Plan: Sinus bradycardia -Resolved   - Continue Coreg 3.125 mg  BID per cardiology    Cardiogenic shock / Hypovolemic shock -Resolved -Patient eating and drinking will KVO fluids  -Obtain orthostatic vitals and if stable Ambulate patient. Counseled  patient that if all tests today within normal limits will speak with daughter about discharging in the a.m.  Elevated troponin -Most likely secondary to cardiogenic shock/demand ischemia however will continue to trend  -Cardiology following   Parox Afib post CABG -Rate controlled off of amiodarone. Amiodarone is not to be started prior to discharge -See hypothyroidism   CAD s/p CABG -Continue low dose Coreg (3.125 mg BID)  - cont ASA 325 mg daily  Chronic ischemic cardiomyopathy -EF as low as 20-25% in July 2015 but has since recovered - 55% by echo this admission - NO ACE/ARB due to acute renal failure  Pulmonary hypertension -See CAD S/P CABG  Acute Renal failure -ATN due to hypovolemic shock,improving w/ volume - Nephrology following    Hyperkalemia > Hypokalemia -Gently replace and follow trend -K-Dur 40 mEq 1 -Potassium goal> 4  Hypomagnesemia -Magnesium IV 3 gm -Magnesium goal> 2  Hyponatremia -Resolved   Lactic acidosis -Resolved.   Elevated troponin -Trending down, continue follow   GERD  Anemia -Stable; transfuse for hemoglobin goal< 8  Pan sensitive E coli UTI -DC Rocephin, and will start Ceftin 250 mg  BID ; project stop date 6/29    Toxic metabolic encephalopathy -Most likely multifactorial to include hypovolemia, acute renal failure, sepsis  -Patient is A/O 4; resolved   Hypothyroidism  -Increase Synthroid 137 g daily -TSH = 10, PCP will need to recheck in 6-8 weeks     Code Status: DNR  Family Communication: Left message with daughter and Marvel Plan [phone]  Disposition Plan: Resolution bradycardia    Consultants: Dr. Fransico Him (Cardiology)  Dr. Edrick Oh (Nephrology)     Procedure/Significant Events: 1/11  echocardiogram; - Left ventricle: Inferobasal hypokinesis moderate LVH.  -LVEF=55%. - Pulmonary arteries: PA peak pressure: 46 mm Hg (S).    Culture 1/10 blood right arm 2 NGTD 1/10 urine positive  Escherichia coli pansensitive 1/10 MRSA by PCR negative  Antibiotics: Rocephin 1/12 > stopped 1/15 Zosyn 1/10 > stopped1/11 Vanc 1/10>> stopped 1/10 Ceftin 1/15>> stop date 1/18  DVT prophylaxis: SQ heparin    Devices NA   LINES / TUBES:  NA    Continuous Infusions: . sodium chloride 50 mL/hr at 03/26/14 1022    Objective: VITAL SIGNS: Temp: 98.1 F (36.7 C) (01/15 1100) Temp Source: Oral (01/15 1100) BP: 133/59 mmHg (01/15 1201) Pulse Rate: 72 (01/15 1201) SPO2; FIO2:   Intake/Output Summary (Last 24 hours) at 03/26/14 1426 Last data filed at 03/26/14 0440  Gross per 24 hour  Intake    650 ml  Output    250 ml  Net    400 ml     Exam: General: A/O 4, NAD, sitting in bed comfortably, No acute respiratory distress Lungs: Clear to auscultation bilaterally without wheezes or crackles Cardiovascular: Regular rate and rhythm without murmur gallop or rub normal S1 and S2 Abdomen: Nontender, nondistended, soft, bowel sounds positive, no rebound, no ascites, no appreciable mass Extremities: No significant cyanosis, clubbing, or edema bilateral lower extremities  Data Reviewed: Basic Metabolic Panel:  Recent Labs Lab 03/24/14 0209 03/24/14 1454 03/25/14 0310 03/25/14 1608 03/26/14 0505  NA 133* 136 140 137 137  K 2.8* 2.9* 2.9* 3.6 3.6  CL 85* 84* 91* 93* 96  CO2 35* 38* 41* 37* 36*  GLUCOSE 130* 239* 152* 240* 130*  BUN 72* 63* 50* 37* 25*  CREATININE 2.90* 2.13* 1.46* 1.14* 0.85  CALCIUM 7.4* 7.6* 8.1* 8.1* 8.2*  MG 1.8  --  1.7  --  1.7  PHOS 4.7*  --   --   --   --    Liver Function Tests:  Recent Labs Lab 03/21/14 1045 03/25/14 0310 03/26/14 0505  AST 54* 42* 37  ALT 30 28 26   ALKPHOS 37* 40 41  BILITOT 0.7 0.3 0.5  PROT 4.9* 4.5* 4.9*  ALBUMIN 2.6* 2.3* 2.4*   No results for input(s): LIPASE, AMYLASE in the last 168 hours. No results for input(s): AMMONIA in the last 168 hours. CBC:  Recent Labs Lab 03/21/14 1045  03/22/14 0457 03/24/14 0209 03/25/14 0310 03/26/14 0505  WBC 12.4* 16.5* 11.2* 8.3 7.7  NEUTROABS  --   --   --   --  5.2  HGB 10.5* 10.8* 10.0* 10.5* 11.1*  HCT 32.6* 33.9* 28.7* 31.7* 33.8*  MCV 101.6* 103.4* 98.0 100.6* 98.5  PLT 340 338 247 250 238   Cardiac Enzymes:  Recent Labs Lab 03/22/14 0457 03/22/14 1055 03/25/14 1600 03/25/14 2310 03/26/14 0505  CKTOTAL  --  100  --   --   --   TROPONINI 0.48* 0.48* 0.25* 0.24* 0.22*   BNP (last 3 results)  Recent Labs  10/14/13 0350  PROBNP 2044.0*   CBG:  Recent Labs Lab 03/21/14 2005  GLUCAP 83    Recent Results (from the past 240 hour(s))  Blood culture (routine x 2)     Status: None (Preliminary result)   Collection Time: 03/21/14 12:40 PM  Result Value Ref Range Status   Specimen Description BLOOD RIGHT ARM  Final   Special Requests BOTTLES DRAWN AEROBIC AND ANAEROBIC 10 CC  Final   Culture   Final  BLOOD CULTURE RECEIVED NO GROWTH TO DATE CULTURE WILL BE HELD FOR 5 DAYS BEFORE ISSUING A FINAL NEGATIVE REPORT Performed at [facility]    Report Status PENDING  Incomplete  Blood culture (routine x 2)     Status: None (Preliminary result)   Collection Time: 03/21/14  1:00 PM  Result Value Ref Range Status   Specimen Description BLOOD RIGHT ARM  Final   Special Requests BOTTLES DRAWN AEROBIC AND ANAEROBIC 10 CC  Final   Culture   Final           BLOOD CULTURE RECEIVED NO GROWTH TO DATE CULTURE WILL BE HELD FOR 5 DAYS BEFORE ISSUING A FINAL NEGATIVE REPORT Performed at [facility]    Report Status PENDING  Incomplete  Urine culture     Status: None   Collection Time: 03/21/14  1:24 PM  Result Value Ref Range Status   Specimen Description URINE, CATHETERIZED  Final   Special Requests NONE  Final   Colony Count   Final    >=100,000 COLONIES/ML Performed at [facility]    Culture   Final    ESCHERICHIA COLI Performed at [facility]    Report Status  03/24/2014 FINAL  Final   Organism ID, Bacteria ESCHERICHIA COLI  Final      Susceptibility   Escherichia coli - MIC*    AMPICILLIN <=2 SENSITIVE Sensitive     CEFAZOLIN <=4 SENSITIVE Sensitive     CEFTRIAXONE <=1 SENSITIVE Sensitive     CIPROFLOXACIN <=0.25 SENSITIVE Sensitive     GENTAMICIN <=1 SENSITIVE Sensitive     LEVOFLOXACIN <=0.12 SENSITIVE Sensitive     NITROFURANTOIN <=16 SENSITIVE Sensitive     TOBRAMYCIN <=1 SENSITIVE Sensitive     TRIMETH/SULFA <=20 SENSITIVE Sensitive     PIP/TAZO <=4 SENSITIVE Sensitive     * ESCHERICHIA COLI  MRSA PCR Screening     Status: None   Collection Time: 03/21/14 10:57 PM  Result Value Ref Range Status   MRSA by PCR NEGATIVE NEGATIVE Final    Comment:        The GeneXpert MRSA Assay (FDA approved for NASAL specimens only), is one component of a comprehensive MRSA colonization surveillance program. It is not intended to diagnose MRSA infection nor to guide or monitor treatment for MRSA infections.      Studies:  Recent x-ray studies have been reviewed in detail by the Attending Physician  Scheduled Meds:  Scheduled Meds: . aspirin EC  325 mg Oral Daily  . atorvastatin  80 mg Oral Daily  . carvedilol  3.125 mg Oral BID WC  . cefTRIAXone (ROCEPHIN)  IV  1 g Intravenous Q24H  . heparin  5,000 Units Subcutaneous 3 times per day  . lactose free nutrition  237 mL Oral BID BM  . levothyroxine  112 mcg Oral QAC breakfast  . pantoprazole  40 mg Oral Daily    Time spent on care of this patient: 40 mins   Allie Bossier , MD   [facility] Office  [phone] Pager - [phone]  On-Call/Text Page:      [email or web address]      password [email or web address]  If 7PM-7AM, please contact night-coverage [email or web address] Password [email or web address] 03/26/2014, 2:26 PM   LOS: 5 days

## 2014-03-27 DIAGNOSIS — E785 Hyperlipidemia, unspecified: Secondary | ICD-10-CM

## 2014-03-27 LAB — CBC WITH DIFFERENTIAL/PLATELET
BASOS PCT: 0 % (ref 0–1)
Basophils Absolute: 0 10*3/uL (ref 0.0–0.1)
EOS ABS: 0.4 10*3/uL (ref 0.0–0.7)
Eosinophils Relative: 4 % (ref 0–5)
HCT: 31.6 % — ABNORMAL LOW (ref 36.0–46.0)
HEMOGLOBIN: 10.4 g/dL — AB (ref 12.0–15.0)
Lymphocytes Relative: 15 % (ref 12–46)
Lymphs Abs: 1.3 10*3/uL (ref 0.7–4.0)
MCH: 32.6 pg (ref 26.0–34.0)
MCHC: 32.9 g/dL (ref 30.0–36.0)
MCV: 99.1 fL (ref 78.0–100.0)
MONOS PCT: 10 % (ref 3–12)
Monocytes Absolute: 0.9 10*3/uL (ref 0.1–1.0)
NEUTROS ABS: 6.1 10*3/uL (ref 1.7–7.7)
Neutrophils Relative %: 71 % (ref 43–77)
PLATELETS: 248 10*3/uL (ref 150–400)
RBC: 3.19 MIL/uL — ABNORMAL LOW (ref 3.87–5.11)
RDW: 13.6 % (ref 11.5–15.5)
WBC: 8.6 10*3/uL (ref 4.0–10.5)

## 2014-03-27 LAB — COMPREHENSIVE METABOLIC PANEL
ALBUMIN: 2.3 g/dL — AB (ref 3.5–5.2)
ALK PHOS: 39 U/L (ref 39–117)
ALT: 24 U/L (ref 0–35)
AST: 29 U/L (ref 0–37)
Anion gap: 5 (ref 5–15)
BILIRUBIN TOTAL: 0.4 mg/dL (ref 0.3–1.2)
BUN: 15 mg/dL (ref 6–23)
CHLORIDE: 98 meq/L (ref 96–112)
CO2: 33 mmol/L — ABNORMAL HIGH (ref 19–32)
Calcium: 8.1 mg/dL — ABNORMAL LOW (ref 8.4–10.5)
Creatinine, Ser: 0.75 mg/dL (ref 0.50–1.10)
GFR calc Af Amer: 86 mL/min — ABNORMAL LOW (ref >90)
GFR, EST NON AFRICAN AMERICAN: 74 mL/min — AB (ref >90)
GLUCOSE: 147 mg/dL — AB (ref 70–99)
Potassium: 3.5 mmol/L (ref 3.5–5.1)
Sodium: 136 mmol/L (ref 135–145)
TOTAL PROTEIN: 4.6 g/dL — AB (ref 6.0–8.3)

## 2014-03-27 LAB — CULTURE, BLOOD (ROUTINE X 2)
CULTURE: NO GROWTH
Culture: NO GROWTH

## 2014-03-27 LAB — MAGNESIUM: Magnesium: 1.8 mg/dL (ref 1.5–2.5)

## 2014-03-27 MED ORDER — MAGNESIUM SULFATE 2 GM/50ML IV SOLN
2.0000 g | Freq: Once | INTRAVENOUS | Status: AC
  Administered 2014-03-27: 2 g via INTRAVENOUS
  Filled 2014-03-27: qty 50

## 2014-03-27 MED ORDER — POTASSIUM CHLORIDE CRYS ER 20 MEQ PO TBCR
40.0000 meq | EXTENDED_RELEASE_TABLET | Freq: Once | ORAL | Status: AC
  Administered 2014-03-27: 40 meq via ORAL
  Filled 2014-03-27: qty 2

## 2014-03-27 NOTE — Progress Notes (Signed)
[place] TEAM 1 - Stepdown/ICU TEAM Progress Note  Marissa Jefferson MOQ:[id number] DOB: 1925/01/14 DOA: 03/21/2014 PCP: Thressa Sheller, MD  Admit HPI / Brief Narrative: 81 year old WF PMHx remote treated breast cancer, hypertension, diabetes, and coronary artery disease identified in July 2015 comp related by ischemic cardiomyopathy. S/P CABG 4 vessel, rehabilitated and is now living back at home. She states that about 2 weeks ago she suffered a fall with an injury to her coccyx. She does not believe she lost consciousness but instead it was a mechanical fall. Since that time she has not been doing as well. In particular she states that her by mouth intake has decreased. The last 4-5 days prior to admission she was barely eating or drinking anything, largely due to lack of appetite. She denied any fevers, upper respiratory symptoms including cough or mucus, chest discomfort, nausea vomiting diarrhea, or dysuria. She states that she was having normal bowel movements but that she has not needed to urinate since early 03/19/13. She has acutely more lethargic and weak so she presented to the emergency department. In the ED she was severely bradycardic (sinus) and hypotensive. Labs identified acute renal failure with associated hyperkalemia and metabolic acidosis. She is admitted for further care 1/10. She states that her CODE STATUS should be DO NOT RESUSCITATE and that she is not interested in hemodialysis. Her blood pressure and heart rate are improving slightly with volume resuscitation.   HPI/Subjective: 1/16 Currently A/O 4, negative CP, negative SOB. RN states patient does have waxing and waning periods of confusion throughout day.   Assessment/Plan: Sinus bradycardia -Resolved   - Continue Coreg 3.125 mg  BID per cardiology    Cardiogenic shock / Hypovolemic shock -Resolved -Patient eating and drinking DC fluids  -Arrangements for patient's discharge for Monday per CSW   Elevated  troponin -Most likely secondary to cardiogenic shock/demand ischemia however will continue to trend  -Trending down -Cardiology following   Parox Afib post CABG -Rate controlled off of amiodarone. Amiodarone is not to be started prior to discharge -See hypothyroidism   CAD s/p CABG -Continue low dose Coreg (3.125 mg BID)  - cont ASA 325 mg daily  Chronic ischemic cardiomyopathy -EF as low as 20-25% in July 2015 but has since recovered - 55% by echo this admission - NO ACE/ARB due to acute renal failure  Pulmonary hypertension -See CAD S/P CABG  Acute Renal failure -ATN due to hypovolemic shock,improving w/ volume - Nephrology following    Hypokalemia -Gently replace and follow trend -K-Dur 40 mEq 1 -Potassium goal> 4  Hypomagnesemia -Magnesium IV 2 gm -Magnesium goal> 2  Hyponatremia -Resolved   Lactic acidosis -Resolved.   GERD  Anemia -Stable; transfuse for hemoglobin goal< 8  Pan sensitive E coli UTI -Continue Ceftin 250 mg  BID ; project stop date 3/54    Toxic metabolic encephalopathy -Most likely multifactorial to include hypovolemia, acute renal failure, sepsis  -Patient is A/O 4; resolved   Hypothyroidism  -Increase Synthroid 137 g daily -TSH = 10, PCP will need to recheck in 6-8 weeks     Code Status: DNR  Family Communication: Left message with daughter and Marvel Plan [phone]  Disposition Plan: Resolution bradycardia    Consultants: Dr. Fransico Him (Cardiology)  Dr. Edrick Oh (Nephrology)     Procedure/Significant Events: 1/11 echocardiogram; - Left ventricle: Inferobasal hypokinesis moderate LVH.  -LVEF=55%. - Pulmonary arteries: PA peak pressure: 46 mm Hg (S).    Culture 1/10 blood right arm 2 NGTD 1/10  urine positive Escherichia coli pansensitive 1/10 MRSA by PCR negative  Antibiotics: Rocephin 1/12 > stopped 1/15 Zosyn 1/10 > stopped1/11 Vanc 1/10>> stopped 1/10 Ceftin 1/15>> stop date 1/18  DVT  prophylaxis: SQ heparin    Devices NA   LINES / TUBES:  NA    Continuous Infusions:    Objective: VITAL SIGNS: Temp: 97.9 F (36.6 C) (01/16 1634) Temp Source: Oral (01/16 1634) BP: 124/64 mmHg (01/16 1600) Pulse Rate: 83 (01/16 1600) SPO2; FIO2:   Intake/Output Summary (Last 24 hours) at 03/27/14 1744 Last data filed at 03/27/14 1400  Gross per 24 hour  Intake     80 ml  Output      0 ml  Net     80 ml     Exam: General: A/O 4, NAD, sitting in bed comfortably, No acute respiratory distress Lungs: Clear to auscultation bilaterally without wheezes or crackles Cardiovascular: Regular rate and rhythm without murmur gallop or rub normal S1 and S2 Abdomen: Nontender, nondistended, soft, bowel sounds positive, no rebound, no ascites, no appreciable mass Extremities: No significant cyanosis, clubbing, or edema bilateral lower extremities  Data Reviewed: Basic Metabolic Panel:  Recent Labs Lab 03/24/14 0209 03/24/14 1454 03/25/14 0310 03/25/14 1608 03/26/14 0505 03/27/14 0305  NA 133* 136 140 137 137 136  K 2.8* 2.9* 2.9* 3.6 3.6 3.5  CL 85* 84* 91* 93* 96 98  CO2 35* 38* 41* 37* 36* 33*  GLUCOSE 130* 239* 152* 240* 130* 147*  BUN 72* 63* 50* 37* 25* 15  CREATININE 2.90* 2.13* 1.46* 1.14* 0.85 0.75  CALCIUM 7.4* 7.6* 8.1* 8.1* 8.2* 8.1*  MG 1.8  --  1.7  --  1.7 1.8  PHOS 4.7*  --   --   --   --   --    Liver Function Tests:  Recent Labs Lab 03/21/14 1045 03/25/14 0310 03/26/14 0505 03/27/14 0305  AST 54* 42* 37 29  ALT 30 28 26 24   ALKPHOS 37* 40 41 39  BILITOT 0.7 0.3 0.5 0.4  PROT 4.9* 4.5* 4.9* 4.6*  ALBUMIN 2.6* 2.3* 2.4* 2.3*   No results for input(s): LIPASE, AMYLASE in the last 168 hours. No results for input(s): AMMONIA in the last 168 hours. CBC:  Recent Labs Lab 03/22/14 0457 03/24/14 0209 03/25/14 0310 03/26/14 0505 03/27/14 0305  WBC 16.5* 11.2* 8.3 7.7 8.6  NEUTROABS  --   --   --  5.2 6.1  HGB 10.8* 10.0* 10.5* 11.1*  10.4*  HCT 33.9* 28.7* 31.7* 33.8* 31.6*  MCV 103.4* 98.0 100.6* 98.5 99.1  PLT 338 247 250 238 248   Cardiac Enzymes:  Recent Labs Lab 03/22/14 0457 03/22/14 1055 03/25/14 1600 03/25/14 2310 03/26/14 0505  CKTOTAL  --  100  --   --   --   TROPONINI 0.48* 0.48* 0.25* 0.24* 0.22*   BNP (last 3 results)  Recent Labs  10/14/13 0350  PROBNP 2044.0*   CBG:  Recent Labs Lab 03/21/14 2005  GLUCAP 83    Recent Results (from the past 240 hour(s))  Blood culture (routine x 2)     Status: None   Collection Time: 03/21/14 12:40 PM  Result Value Ref Range Status   Specimen Description BLOOD RIGHT ARM  Final   Special Requests BOTTLES DRAWN AEROBIC AND ANAEROBIC 10 CC  Final   Culture   Final    NO GROWTH 5 DAYS Performed at [facility]    Report Status 03/27/2014 FINAL  Final  Blood culture (routine x 2)     Status: None   Collection Time: 03/21/14  1:00 PM  Result Value Ref Range Status   Specimen Description BLOOD RIGHT ARM  Final   Special Requests BOTTLES DRAWN AEROBIC AND ANAEROBIC 10 CC  Final   Culture   Final    NO GROWTH 5 DAYS Performed at [facility]    Report Status 03/27/2014 FINAL  Final  Urine culture     Status: None   Collection Time: 03/21/14  1:24 PM  Result Value Ref Range Status   Specimen Description URINE, CATHETERIZED  Final   Special Requests NONE  Final   Colony Count   Final    >=100,000 COLONIES/ML Performed at [facility]    Culture   Final    ESCHERICHIA COLI Performed at [facility]    Report Status 03/24/2014 FINAL  Final   Organism ID, Bacteria ESCHERICHIA COLI  Final      Susceptibility   Escherichia coli - MIC*    AMPICILLIN <=2 SENSITIVE Sensitive     CEFAZOLIN <=4 SENSITIVE Sensitive     CEFTRIAXONE <=1 SENSITIVE Sensitive     CIPROFLOXACIN <=0.25 SENSITIVE Sensitive     GENTAMICIN <=1 SENSITIVE Sensitive     LEVOFLOXACIN <=0.12 SENSITIVE Sensitive     NITROFURANTOIN <=16 SENSITIVE  Sensitive     TOBRAMYCIN <=1 SENSITIVE Sensitive     TRIMETH/SULFA <=20 SENSITIVE Sensitive     PIP/TAZO <=4 SENSITIVE Sensitive     * ESCHERICHIA COLI  MRSA PCR Screening     Status: None   Collection Time: 03/21/14 10:57 PM  Result Value Ref Range Status   MRSA by PCR NEGATIVE NEGATIVE Final    Comment:        The GeneXpert MRSA Assay (FDA approved for NASAL specimens only), is one component of a comprehensive MRSA colonization surveillance program. It is not intended to diagnose MRSA infection nor to guide or monitor treatment for MRSA infections.      Studies:  Recent x-ray studies have been reviewed in detail by the Attending Physician  Scheduled Meds:  Scheduled Meds: . aspirin EC  325 mg Oral Daily  . atorvastatin  80 mg Oral Daily  . carvedilol  3.125 mg Oral BID WC  . cefTRIAXone (ROCEPHIN)  IV  1 g Intravenous Q24H  . heparin  5,000 Units Subcutaneous 3 times per day  . lactose free nutrition  237 mL Oral BID BM  . levothyroxine  137 mcg Oral QAC breakfast  . magnesium sulfate 1 - 4 g bolus IVPB  2 g Intravenous Once  . pantoprazole  40 mg Oral Daily  . potassium chloride  40 mEq Oral Once    Time spent on care of this patient: 40 mins   Allie Bossier , MD   [facility] Office  [phone] Pager [phone]  On-Call/Text Page:      [email or web address]      password [email or web address]  If 7PM-7AM, please contact night-coverage [email or web address] Password [email or web address] 03/27/2014, 5:44 PM   LOS: 6 days

## 2014-03-27 NOTE — Progress Notes (Signed)
Patient ID: Marissa Jefferson, female   DOB: 06-13-1924, 81 y.o.   MRN: [id number]    Subjective:    No complaints this AM  Objective:   Temp:  [97.3 F (36.3 C)-98.5 F (36.9 C)] 98.5 F (36.9 C) (01/16 1119) Pulse Rate:  [75-83] 78 (01/16 0811) Resp:  [14-25] 19 (01/16 0811) BP: (133-159)/(68-85) 148/85 mmHg (01/16 0811) SpO2:  [97 %-100 %] 100 % (01/16 0811) Weight:  [145 lb 4.5 oz (65.9 kg)] 145 lb 4.5 oz (65.9 kg) (01/16 0449) Last BM Date:  (flexiseal)  Filed Weights   03/25/14 0500 03/26/14 0440 03/27/14 0449  Weight: 147 lb 6.4 oz (66.86 kg) 147 lb 6.4 oz (66.86 kg) 145 lb 4.5 oz (65.9 kg)    Intake/Output Summary (Last 24 hours) at 03/27/14 1226 Last data filed at 03/27/14 0800  Gross per 24 hour  Intake    370 ml  Output      0 ml  Net    370 ml    Telemetry: NSR 70-80s  Exam:  General: NAD  Resp: CTAB  Cardiac:RRR, no m/r/g, no JVD  RW:ERXVQMG soft, NT, ND  MSK:no LE edema  Neuro: no focal deficits  Lab Results:  Basic Metabolic Panel:  Recent Labs Lab 03/25/14 0310 03/25/14 1608 03/26/14 0505 03/27/14 0305  NA 140 137 137 136  K 2.9* 3.6 3.6 3.5  CL 91* 93* 96 98  CO2 41* 37* 36* 33*  GLUCOSE 152* 240* 130* 147*  BUN 50* 37* 25* 15  CREATININE 1.46* 1.14* 0.85 0.75  CALCIUM 8.1* 8.1* 8.2* 8.1*  MG 1.7  --  1.7 1.8    Liver Function Tests:  Recent Labs Lab 03/25/14 0310 03/26/14 0505 03/27/14 0305  AST 42* 37 29  ALT 28 26 24   ALKPHOS 40 41 39  BILITOT 0.3 0.5 0.4  PROT 4.5* 4.9* 4.6*  ALBUMIN 2.3* 2.4* 2.3*    CBC:  Recent Labs Lab 03/25/14 0310 03/26/14 0505 03/27/14 0305  WBC 8.3 7.7 8.6  HGB 10.5* 11.1* 10.4*  HCT 31.7* 33.8* 31.6*  MCV 100.6* 98.5 99.1  PLT 250 238 248    Cardiac Enzymes:  Recent Labs Lab 03/22/14 1055 03/25/14 1600 03/25/14 2310 03/26/14 0505  CKTOTAL 100  --   --   --   TROPONINI 0.48* 0.25* 0.24* 0.22*    BNP:  Recent Labs  10/14/13 0350  PROBNP 2044.0*     Coagulation: No results for input(s): INR in the last 168 hours.  ECG:   Medications:   Scheduled Medications: . aspirin EC  325 mg Oral Daily  . atorvastatin  80 mg Oral Daily  . carvedilol  3.125 mg Oral BID WC  . cefTRIAXone (ROCEPHIN)  IV  1 g Intravenous Q24H  . heparin  5,000 Units Subcutaneous 3 times per day  . lactose free nutrition  237 mL Oral BID BM  . levothyroxine  137 mcg Oral QAC breakfast  . pantoprazole  40 mg Oral Daily     Infusions: . sodium chloride 10 mL/hr at 03/27/14 0600     PRN Medications:  acetaminophen, ondansetron     Assessment/Plan     1. AKI - followed by renal, no resolved  2. Hypotension - resolved, now hypertensive - restarting back bp meds as tolerated.  3. Demand ischemia - trop peak 0.81 on admit that has trended down - no plans for ischemic testing at this time.   4. Chronic systolic HF - ICM, echo Jan 2016 LVEF 55%,  mild MR. LVEF improved from echo 09/2013 when LVEF was 20-25% - restarted coreg now that hypotension resolved and bradycardia resolved. Tolerating well with heart rates in 70s to 80s. Keep at current dose for now, if rates remain normal likely increase go 6.25mg  bid tomorrow. Restart ACE-I when ok from renal standpoint.     Carlyle Dolly, M.D.,

## 2014-03-28 DIAGNOSIS — I25110 Atherosclerotic heart disease of native coronary artery with unstable angina pectoris: Secondary | ICD-10-CM

## 2014-03-28 LAB — CBC WITH DIFFERENTIAL/PLATELET
Basophils Absolute: 0 10*3/uL (ref 0.0–0.1)
Basophils Relative: 0 % (ref 0–1)
Eosinophils Absolute: 0.4 10*3/uL (ref 0.0–0.7)
Eosinophils Relative: 4 % (ref 0–5)
HEMATOCRIT: 31.4 % — AB (ref 36.0–46.0)
Hemoglobin: 10.3 g/dL — ABNORMAL LOW (ref 12.0–15.0)
Lymphocytes Relative: 18 % (ref 12–46)
Lymphs Abs: 1.4 10*3/uL (ref 0.7–4.0)
MCH: 32.4 pg (ref 26.0–34.0)
MCHC: 32.8 g/dL (ref 30.0–36.0)
MCV: 98.7 fL (ref 78.0–100.0)
MONO ABS: 0.8 10*3/uL (ref 0.1–1.0)
Monocytes Relative: 9 % (ref 3–12)
Neutro Abs: 5.5 10*3/uL (ref 1.7–7.7)
Neutrophils Relative %: 69 % (ref 43–77)
Platelets: 252 10*3/uL (ref 150–400)
RBC: 3.18 MIL/uL — AB (ref 3.87–5.11)
RDW: 13.5 % (ref 11.5–15.5)
WBC: 8.1 10*3/uL (ref 4.0–10.5)

## 2014-03-28 LAB — COMPREHENSIVE METABOLIC PANEL
ALT: 23 U/L (ref 0–35)
AST: 30 U/L (ref 0–37)
Albumin: 2.2 g/dL — ABNORMAL LOW (ref 3.5–5.2)
Alkaline Phosphatase: 38 U/L — ABNORMAL LOW (ref 39–117)
Anion gap: 3 — ABNORMAL LOW (ref 5–15)
BILIRUBIN TOTAL: 0.6 mg/dL (ref 0.3–1.2)
BUN: 12 mg/dL (ref 6–23)
CO2: 32 mmol/L (ref 19–32)
Calcium: 8.2 mg/dL — ABNORMAL LOW (ref 8.4–10.5)
Chloride: 101 mEq/L (ref 96–112)
Creatinine, Ser: 0.63 mg/dL (ref 0.50–1.10)
GFR calc Af Amer: >90 mL/min (ref >90)
GFR, EST NON AFRICAN AMERICAN: 78 mL/min — AB (ref >90)
Glucose, Bld: 152 mg/dL — ABNORMAL HIGH (ref 70–99)
POTASSIUM: 3.9 mmol/L (ref 3.5–5.1)
SODIUM: 136 mmol/L (ref 135–145)
Total Protein: 4.6 g/dL — ABNORMAL LOW (ref 6.0–8.3)

## 2014-03-28 LAB — MAGNESIUM: Magnesium: 1.8 mg/dL (ref 1.5–2.5)

## 2014-03-28 MED ORDER — CEFUROXIME AXETIL 250 MG PO TABS
250.0000 mg | ORAL_TABLET | Freq: Two times a day (BID) | ORAL | Status: DC
  Administered 2014-03-28 – 2014-03-29 (×3): 250 mg via ORAL
  Filled 2014-03-28 (×4): qty 1

## 2014-03-28 NOTE — Progress Notes (Signed)
Primary cardiologist:  Subjective:    No complaints  Objective:   Temp:  [97.9 F (36.6 C)-98.6 F (37 C)] 98.2 F (36.8 C) (01/17 0700) Pulse Rate:  [74-84] 74 (01/17 0345) Resp:  [12-17] 13 (01/17 0345) BP: (116-151)/(64-79) 151/77 mmHg (01/17 0345) SpO2:  [100 %] 100 % (01/17 0345) Weight:  [151 lb 7.3 oz (68.7 kg)] 151 lb 7.3 oz (68.7 kg) (01/17 0345) Last BM Date: 03/27/14  Filed Weights   03/26/14 0440 03/27/14 0449 03/28/14 0345  Weight: 147 lb 6.4 oz (66.86 kg) 145 lb 4.5 oz (65.9 kg) 151 lb 7.3 oz (68.7 kg)    Intake/Output Summary (Last 24 hours) at 03/28/14 0934 Last data filed at 03/27/14 2000  Gross per 24 hour  Intake    240 ml  Output      0 ml  Net    240 ml    Telemetry: NSR 70-80s  Exam:  General: NAD  Resp: CTAB  Cardiac: RRR, no m/r/g, no JVD  DS:KAJGOTL soft, NT, ND  MSK:no LE edema  Neuro: no focal deficits   Lab Results:  Basic Metabolic Panel:  Recent Labs Lab 03/26/14 0505 03/27/14 0305 03/28/14 0335  NA 137 136 136  K 3.6 3.5 3.9  CL 96 98 101  CO2 36* 33* 32  GLUCOSE 130* 147* 152*  BUN 25* 15 12  CREATININE 0.85 0.75 0.63  CALCIUM 8.2* 8.1* 8.2*  MG 1.7 1.8 1.8    Liver Function Tests:  Recent Labs Lab 03/26/14 0505 03/27/14 0305 03/28/14 0335  AST 37 29 30  ALT 26 24 23   ALKPHOS 41 39 38*  BILITOT 0.5 0.4 0.6  PROT 4.9* 4.6* 4.6*  ALBUMIN 2.4* 2.3* 2.2*    CBC:  Recent Labs Lab 03/26/14 0505 03/27/14 0305 03/28/14 0335  WBC 7.7 8.6 8.1  HGB 11.1* 10.4* 10.3*  HCT 33.8* 31.6* 31.4*  MCV 98.5 99.1 98.7  PLT 238 248 252    Cardiac Enzymes:  Recent Labs Lab 03/22/14 1055 03/25/14 1600 03/25/14 2310 03/26/14 0505  CKTOTAL 100  --   --   --   TROPONINI 0.48* 0.25* 0.24* 0.22*    BNP:  Recent Labs  10/14/13 0350  PROBNP 2044.0*    Coagulation: No results for input(s): INR in the last 168 hours.  ECG:   Medications:   Scheduled Medications: . aspirin EC  325 mg Oral  Daily  . atorvastatin  80 mg Oral Daily  . carvedilol  3.125 mg Oral BID WC  . cefTRIAXone (ROCEPHIN)  IV  1 g Intravenous Q24H  . heparin  5,000 Units Subcutaneous 3 times per day  . lactose free nutrition  237 mL Oral BID BM  . levothyroxine  137 mcg Oral QAC breakfast  . pantoprazole  40 mg Oral Daily     Infusions:     PRN Medications:  acetaminophen, ondansetron     Assessment/Plan   1. AKI - followed by renal, no resolved  2. Hypotension/bradycardia - resolved, now hypertensive with normal heart rates - multifactorial including hypovolemia, beta blocker, ACE, amio, hypothyroid - restarting back bp meds as tolerated.  3. Demand ischemia - trop peak 0.81 on admit that has trended down - no plans for ischemic testing at this time.   4. Chronic systolic HF/ICM - ICM, echo Jan 2016 LVEF 55%, mild MR. LVEF improved from echo 09/2013 when LVEF was 20-25%. Hx of CABG 09/2013.  - restarted coreg now that hypotension resolved and bradycardia  resolved. Tolerating well with heart rates in 70s to 80s, will keep at current dose, avoid titrating in setting of hypothyroidism. Recommend starting ACE-I back when ok from renal standpoint. If no plans to restart ACE would suggest low dose norvasc.   5. Hx of PAF - has post op afib from CABG 09/2013, was started on amio at that time.  - amio stopped on admit due to bradycarida, will not restart, likely not needed given her afib was postoperative 6 months ago along with risks of recurrent bradycardia. Continue to follow clinically  6. Hypothyroidism - elevated TSH to 10, potentially related to amio. Now off amio - management per primary team         Carlyle Dolly, M.D., F.A.C.C.

## 2014-03-28 NOTE — Progress Notes (Signed)
[facility] TEAM 1 - Stepdown/ICU TEAM Progress Note  Jaliza Seifried EKC:[id number] DOB: [birth year] DOA: 03/21/2014 PCP: Thressa Sheller, MD  Admit HPI / Brief Narrative: 81 year old WF PMHx remote treated breast cancer, hypertension, diabetes, and coronary artery disease identified in July 2015 comp related by ischemic cardiomyopathy. S/P CABG 4 vessel, rehabilitated and is now living back at home. She states that about 2 weeks ago she suffered a fall with an injury to her coccyx. She does not believe she lost consciousness but instead it was a mechanical fall. Since that time she has not been doing as well. In particular she states that her by mouth intake has decreased. The last 4-5 days prior to admission she was barely eating or drinking anything, largely due to lack of appetite. She denied any fevers, upper respiratory symptoms including cough or mucus, chest discomfort, nausea vomiting diarrhea, or dysuria. She states that she was having normal bowel movements but that she has not needed to urinate since early 03/19/13. She has acutely more lethargic and weak so she presented to the emergency department. In the ED she was severely bradycardic (sinus) and hypotensive. Labs identified acute renal failure with associated hyperkalemia and metabolic acidosis. She is admitted for further care 1/10. She states that her CODE STATUS should be DO NOT RESUSCITATE and that she is not interested in hemodialysis. Her blood pressure and heart rate are improving slightly with volume resuscitation.   HPI/Subjective: 1/17 Currently A/O 4, negative CP, negative SOB. Patient currently excited to watch path again this afternoon.  Assessment/Plan: Sinus bradycardia -Resolved   - Continue Coreg 3.125 mg  BID per cardiology    Cardiogenic shock / Hypovolemic shock -Resolved -Patient eating and drinking DC fluids  -Arrangements for patient's discharge for Monday per CSW   Elevated troponin -Most likely secondary  to cardiogenic shock/demand ischemia however will continue to trend  -Trending down -Cardiology following   Parox Afib post CABG -Rate controlled off of amiodarone. Amiodarone is not to be started prior to discharge -See hypothyroidism   CAD s/p CABG -Continue low dose Coreg (3.125 mg BID)  - cont ASA 325 mg daily  Chronic ischemic cardiomyopathy -EF as low as 20-25% in July 2015 but has since recovered - 55% by echo this admission - NO ACE/ARB due to acute renal failure  Pulmonary hypertension -See CAD S/P CABG  Acute Renal failure -ATN due to hypovolemic shock,improving w/ volume - Nephrology following    Hypokalemia -Gently replace and follow trend -Potassium goal> 4  Hypomagnesemia -Magnesium goal> 2  Hyponatremia -Resolved   Lactic acidosis -Resolved.   GERD  Anemia -Stable; transfuse for hemoglobin goal< 8  Pan sensitive E coli UTI -Continue Ceftin 250 mg  BID ; project stop date 5/05    Toxic metabolic encephalopathy -Most likely multifactorial to include hypovolemia, acute renal failure, sepsis  -Patient is A/O 4; resolved   Hypothyroidism  -Increase Synthroid 137 g daily -TSH = 10, PCP will need to recheck in 6-8 weeks     Code Status: DNR  Family Communication: None   Disposition Plan: Resolution bradycardia    Consultants: Dr. Fransico Him (Cardiology)  Dr. Edrick Oh (Nephrology)     Procedure/Significant Events: 1/11 echocardiogram; - Left ventricle: Inferobasal hypokinesis moderate LVH.  -LVEF=55%. - Pulmonary arteries: PA peak pressure: 46 mm Hg (S).    Culture 1/10 blood right arm 2 NGTD 1/10 urine positive Escherichia coli pansensitive 1/10 MRSA by PCR negative  Antibiotics: Rocephin 1/12 > stopped 1/15 Zosyn 1/10 >  stopped1/11 Vanc 1/10>> stopped 1/10 Ceftin 1/15>> stop date 1/18  DVT prophylaxis: SQ heparin    Devices NA   LINES / TUBES:  NA    Continuous Infusions:    Objective: VITAL  SIGNS: Temp: 97.8 F (36.6 C) (01/17 1137) Temp Source: Oral (01/17 1137) BP: 142/77 mmHg (01/17 1137) Pulse Rate: 74 (01/17 1137) SPO2; FIO2:   Intake/Output Summary (Last 24 hours) at 03/28/14 1246 Last data filed at 03/27/14 2000  Gross per 24 hour  Intake    240 ml  Output      0 ml  Net    240 ml     Exam: General: A/O 4, NAD, sitting in bed comfortably, No acute respiratory distress Lungs: Clear to auscultation bilaterally without wheezes or crackles Cardiovascular: Regular rate and rhythm without murmur gallop or rub normal S1 and S2 Abdomen: Nontender, nondistended, soft, bowel sounds positive, no rebound, no ascites, no appreciable mass Extremities: No significant cyanosis, clubbing, or edema bilateral lower extremities  Data Reviewed: Basic Metabolic Panel:  Recent Labs Lab 03/24/14 0209  03/25/14 0310 03/25/14 1608 03/26/14 0505 03/27/14 0305 03/28/14 0335  NA 133*  < > 140 137 137 136 136  K 2.8*  < > 2.9* 3.6 3.6 3.5 3.9  CL 85*  < > 91* 93* 96 98 101  CO2 35*  < > 41* 37* 36* 33* 32  GLUCOSE 130*  < > 152* 240* 130* 147* 152*  BUN 72*  < > 50* 37* 25* 15 12  CREATININE 2.90*  < > 1.46* 1.14* 0.85 0.75 0.63  CALCIUM 7.4*  < > 8.1* 8.1* 8.2* 8.1* 8.2*  MG 1.8  --  1.7  --  1.7 1.8 1.8  PHOS 4.7*  --   --   --   --   --   --   < > = values in this interval not displayed. Liver Function Tests:  Recent Labs Lab 03/25/14 0310 03/26/14 0505 03/27/14 0305 03/28/14 0335  AST 42* 37 29 30  ALT 28 26 24 23   ALKPHOS 40 41 39 38*  BILITOT 0.3 0.5 0.4 0.6  PROT 4.5* 4.9* 4.6* 4.6*  ALBUMIN 2.3* 2.4* 2.3* 2.2*   No results for input(s): LIPASE, AMYLASE in the last 168 hours. No results for input(s): AMMONIA in the last 168 hours. CBC:  Recent Labs Lab 03/24/14 0209 03/25/14 0310 03/26/14 0505 03/27/14 0305 03/28/14 0335  WBC 11.2* 8.3 7.7 8.6 8.1  NEUTROABS  --   --  5.2 6.1 5.5  HGB 10.0* 10.5* 11.1* 10.4* 10.3*  HCT 28.7* 31.7* 33.8*  31.6* 31.4*  MCV 98.0 100.6* 98.5 99.1 98.7  PLT 247 250 238 248 252   Cardiac Enzymes:  Recent Labs Lab 03/22/14 0457 03/22/14 1055 03/25/14 1600 03/25/14 2310 03/26/14 0505  CKTOTAL  --  100  --   --   --   TROPONINI 0.48* 0.48* 0.25* 0.24* 0.22*   BNP (last 3 results)  Recent Labs  10/14/13 0350  PROBNP 2044.0*   CBG:  Recent Labs Lab 03/21/14 2005  GLUCAP 83    Recent Results (from the past 240 hour(s))  Blood culture (routine x 2)     Status: None   Collection Time: 03/21/14 12:40 PM  Result Value Ref Range Status   Specimen Description BLOOD RIGHT ARM  Final   Special Requests BOTTLES DRAWN AEROBIC AND ANAEROBIC 10 CC  Final   Culture   Final    NO GROWTH 5 DAYS Performed  at [facility]    Report Status 03/27/2014 FINAL  Final  Blood culture (routine x 2)     Status: None   Collection Time: 03/21/14  1:00 PM  Result Value Ref Range Status   Specimen Description BLOOD RIGHT ARM  Final   Special Requests BOTTLES DRAWN AEROBIC AND ANAEROBIC 10 CC  Final   Culture   Final    NO GROWTH 5 DAYS Performed at [facility]    Report Status 03/27/2014 FINAL  Final  Urine culture     Status: None   Collection Time: 03/21/14  1:24 PM  Result Value Ref Range Status   Specimen Description URINE, CATHETERIZED  Final   Special Requests NONE  Final   Colony Count   Final    >=100,000 COLONIES/ML Performed at [facility]    Culture   Final    ESCHERICHIA COLI Performed at [facility]    Report Status 03/24/2014 FINAL  Final   Organism ID, Bacteria ESCHERICHIA COLI  Final      Susceptibility   Escherichia coli - MIC*    AMPICILLIN <=2 SENSITIVE Sensitive     CEFAZOLIN <=4 SENSITIVE Sensitive     CEFTRIAXONE <=1 SENSITIVE Sensitive     CIPROFLOXACIN <=0.25 SENSITIVE Sensitive     GENTAMICIN <=1 SENSITIVE Sensitive     LEVOFLOXACIN <=0.12 SENSITIVE Sensitive     NITROFURANTOIN <=16 SENSITIVE Sensitive     TOBRAMYCIN <=1  SENSITIVE Sensitive     TRIMETH/SULFA <=20 SENSITIVE Sensitive     PIP/TAZO <=4 SENSITIVE Sensitive     * ESCHERICHIA COLI  MRSA PCR Screening     Status: None   Collection Time: 03/21/14 10:57 PM  Result Value Ref Range Status   MRSA by PCR NEGATIVE NEGATIVE Final    Comment:        The GeneXpert MRSA Assay (FDA approved for NASAL specimens only), is one component of a comprehensive MRSA colonization surveillance program. It is not intended to diagnose MRSA infection nor to guide or monitor treatment for MRSA infections.      Studies:  Recent x-ray studies have been reviewed in detail by the Attending Physician  Scheduled Meds:  Scheduled Meds: . aspirin EC  325 mg Oral Daily  . atorvastatin  80 mg Oral Daily  . carvedilol  3.125 mg Oral BID WC  . cefTRIAXone (ROCEPHIN)  IV  1 g Intravenous Q24H  . heparin  5,000 Units Subcutaneous 3 times per day  . lactose free nutrition  237 mL Oral BID BM  . levothyroxine  137 mcg Oral QAC breakfast  . pantoprazole  40 mg Oral Daily    Time spent on care of this patient: 40 mins   Allie Bossier , MD   [facility] Office  [phone] Pager - [phone]  On-Call/Text Page:      [email or web address]      password [email or web address]  If 7PM-7AM, please contact night-coverage [email or web address] Password [email or web address] 03/28/2014, 12:46 PM   LOS: 7 days

## 2014-03-28 NOTE — Progress Notes (Signed)
Utilization review completed.  

## 2014-03-28 NOTE — Progress Notes (Signed)
Patient to transfer to 5W18 report given to receiving RN Anderson Malta all questions answered at this time.  Pt. VSS with no s/s of distress noted.  Patient stable at transfer.

## 2014-03-29 LAB — COMPREHENSIVE METABOLIC PANEL
ALBUMIN: 2.2 g/dL — AB (ref 3.5–5.2)
ALT: 23 U/L (ref 0–35)
AST: 35 U/L (ref 0–37)
Alkaline Phosphatase: 37 U/L — ABNORMAL LOW (ref 39–117)
Anion gap: 7 (ref 5–15)
BILIRUBIN TOTAL: 0.9 mg/dL (ref 0.3–1.2)
BUN: 13 mg/dL (ref 6–23)
CO2: 27 mmol/L (ref 19–32)
Calcium: 8.2 mg/dL — ABNORMAL LOW (ref 8.4–10.5)
Chloride: 99 mEq/L (ref 96–112)
Creatinine, Ser: 0.7 mg/dL (ref 0.50–1.10)
GFR, EST AFRICAN AMERICAN: 88 mL/min — AB (ref >90)
GFR, EST NON AFRICAN AMERICAN: 76 mL/min — AB (ref >90)
GLUCOSE: 130 mg/dL — AB (ref 70–99)
Potassium: 5 mmol/L (ref 3.5–5.1)
Sodium: 133 mmol/L — ABNORMAL LOW (ref 135–145)
Total Protein: 4.7 g/dL — ABNORMAL LOW (ref 6.0–8.3)

## 2014-03-29 LAB — CBC WITH DIFFERENTIAL/PLATELET
Basophils Absolute: 0 10*3/uL (ref 0.0–0.1)
Basophils Relative: 0 % (ref 0–1)
Eosinophils Absolute: 0.3 10*3/uL (ref 0.0–0.7)
Eosinophils Relative: 3 % (ref 0–5)
HEMATOCRIT: 30.6 % — AB (ref 36.0–46.0)
HEMOGLOBIN: 10.3 g/dL — AB (ref 12.0–15.0)
Lymphocytes Relative: 15 % (ref 12–46)
Lymphs Abs: 1.5 10*3/uL (ref 0.7–4.0)
MCH: 33.7 pg (ref 26.0–34.0)
MCHC: 33.7 g/dL (ref 30.0–36.0)
MCV: 100 fL (ref 78.0–100.0)
MONOS PCT: 11 % (ref 3–12)
Monocytes Absolute: 1.2 10*3/uL — ABNORMAL HIGH (ref 0.1–1.0)
NEUTROS PCT: 71 % (ref 43–77)
Neutro Abs: 7.4 10*3/uL (ref 1.7–7.7)
Platelets: 232 10*3/uL (ref 150–400)
RBC: 3.06 MIL/uL — AB (ref 3.87–5.11)
RDW: 13.6 % (ref 11.5–15.5)
WBC: 10.5 10*3/uL (ref 4.0–10.5)

## 2014-03-29 LAB — GLUCOSE, CAPILLARY: GLUCOSE-CAPILLARY: 166 mg/dL — AB (ref 70–99)

## 2014-03-29 LAB — MAGNESIUM: Magnesium: 1.4 mg/dL — ABNORMAL LOW (ref 1.5–2.5)

## 2014-03-29 MED ORDER — MAGNESIUM SULFATE 2 GM/50ML IV SOLN
2.0000 g | Freq: Once | INTRAVENOUS | Status: DC
  Filled 2014-03-29: qty 50

## 2014-03-29 MED ORDER — INSULIN ASPART 100 UNIT/ML ~~LOC~~ SOLN
0.0000 [IU] | Freq: Three times a day (TID) | SUBCUTANEOUS | Status: DC
  Administered 2014-03-30: 2 [IU] via SUBCUTANEOUS
  Administered 2014-03-30: 1 [IU] via SUBCUTANEOUS

## 2014-03-29 MED ORDER — ENSURE COMPLETE PO LIQD
237.0000 mL | Freq: Two times a day (BID) | ORAL | Status: DC
  Administered 2014-03-30: 237 mL via ORAL

## 2014-03-29 MED ORDER — FUROSEMIDE 20 MG PO TABS
20.0000 mg | ORAL_TABLET | Freq: Every day | ORAL | Status: DC
  Administered 2014-03-29 – 2014-03-30 (×2): 20 mg via ORAL
  Filled 2014-03-29 (×2): qty 1

## 2014-03-29 MED ORDER — LISINOPRIL 10 MG PO TABS
10.0000 mg | ORAL_TABLET | Freq: Every day | ORAL | Status: DC
  Administered 2014-03-29 – 2014-03-30 (×2): 10 mg via ORAL
  Filled 2014-03-29 (×3): qty 1

## 2014-03-29 MED ORDER — MAGNESIUM OXIDE 400 (241.3 MG) MG PO TABS
400.0000 mg | ORAL_TABLET | Freq: Two times a day (BID) | ORAL | Status: AC
  Administered 2014-03-29 – 2014-03-30 (×2): 400 mg via ORAL
  Filled 2014-03-29 (×2): qty 1

## 2014-03-29 NOTE — Progress Notes (Addendum)
PATIENT DETAILS Name: Marissa Jefferson Age: 81 y.o. Sex: female Date of Birth: 1924-11-04 Admit Date: 03/21/2014 Admitting Physician Collene Gobble, MD JTT:[id number],[email or web address], MD  Brief narrative:  Patient is 81 year old Caucasian female past medical history of coronary artery disease/CABG, ischemic cardiomyopathy, postoperative A. fib, hypertension and diabetes who presented on 03/21/14 with weakness and very poor oral intake. In the emergency room she was found to be severely bradycardic (sinus) and hypotensive. Labs showed acute renal failure with associated hyperkalemia and metabolic acidosis. She was admitted to the intensive care unit by [facility], cardiology was consulted. Amiodarone and beta blockers along with other hypertensive medications were discontinued. Patient was provided with supportive care. With these measures, patient recovered with resolution of hypotension and bradycardia. Renal function gradually recovered, creatinine now back to normal. Patient continues to improve, plans are to discharge to SNF once bed is available.  Subjective: No major complaints  Assessment/Plan: Principal Problem:   Shock: Multifactorial from bradycardia (cardiogenic) and hypovolemic. Resolved with discontinuation of culprit medications and other supportive care. Blood pressure has actually increased, lisinopril, Coreg and furosemide have been restarted.  Active Problems:   Sinus bradycardia: Likely related to medications-beta blocker and amiodarone. This has now resolved, amiodarone permanently discontinued, low-dose Coreg restarted. TSH elevated at 10, patient already on levothyroxine-dose has been increased 237 mcg-prior dosing was 112 mcg. Will need to recheck TSH in the next 6 weeks.   Elevated troponin: Secondary to Demand ischemia. Does have a history of CAD status post CABG, no further workup required a cardiology.   Acute renal failure: Secondary to acute tubal necrosis in the setting of  hypotension/shock/lisinopril use. Resolved with supportive care.   Parox Afib post CABG: Remain off amiodarone, stable with Coreg. No further recommendations from cardiology at this point   CAD status post CABG: Continue aspirin and statin, once bradycardia resolved, low dose Coreg was restarted and patient seems to be tolerating this well.   Pan sensitive E coli UTI: Has completed a course of antibiotics, will stop Ceftin on 1/18.     Chronic ischemic cardiomyopathy:EF as low as 20-25% in July 2015 but has since recovered - 55% by echo this admission. Remains clinically compensated, restarted on low-dose Lasix.    Hyperkalemia: This was during the initial part of her hospitalization, likely secondary to acute renal failure and this inappropriate use. This has resolved. Once renal failure had resolved, patient was restarted on lisinopril. Potassium remains stable. If hyperkalemia reoccurs, may need to stop ACE inhibitor, recheck electrolytes in a.m.    Encephalopathy: This was likely secondary to multiple electrolyte abnormalities, this has resolved. Patient is completely awake and alert.    History of hypertension: Controlled with lisinopril, Coreg and low-dose Lasix. Follow BP.    History of diabetes: Start SSI, resume metformin on discharge.  Disposition: Remain inpatient-SNF in am  Antibiotics:  See below   Anti-infectives    Start     Dose/Rate Route Frequency Ordered Stop   03/28/14 1700  cefUROXime (CEFTIN) tablet 250 mg     250 mg Oral 2 times daily with meals 03/28/14 1249     03/23/14 1200  cefTRIAXone (ROCEPHIN) 1 g in dextrose 5 % 50 mL IVPB - Premix  Status:  Discontinued     1 g100 mL/hr over 30 Minutes Intravenous Every 24 hours 03/23/14 1106 03/28/14 1249   03/21/14 2100  piperacillin-tazobactam (ZOSYN) IVPB 2.25 g  Status:  Discontinued     2.25 g100 mL/hr  over 30 Minutes Intravenous Every 8 hours 03/21/14 1316 03/22/14 1243   03/21/14 1245  piperacillin-tazobactam  (ZOSYN) IVPB 3.375 g     3.375 g100 mL/hr over 30 Minutes Intravenous  Once 03/21/14 1238 03/21/14 1321   03/21/14 1245  vancomycin (VANCOCIN) IVPB 1000 mg/200 mL premix     1,000 mg200 mL/hr over 60 Minutes Intravenous  Once 03/21/14 1238 03/21/14 1352      DVT Prophylaxis: Prophylactic  Heparin   Code Status:  DNR  Family Communication None at bedside this am  Procedures:  None  CONSULTS:  cardiology and pulmonary/intensive care  Renal  MEDICATIONS: Scheduled Meds: . aspirin EC  325 mg Oral Daily  . atorvastatin  80 mg Oral Daily  . carvedilol  3.125 mg Oral BID WC  . cefUROXime  250 mg Oral BID WC  . [START ON 03/30/2014] feeding supplement (ENSURE COMPLETE)  237 mL Oral BID BM  . furosemide  20 mg Oral Daily  . heparin  5,000 Units Subcutaneous 3 times per day  . levothyroxine  137 mcg Oral QAC breakfast  . lisinopril  10 mg Oral Daily  . magnesium sulfate 1 - 4 g bolus IVPB  2 g Intravenous Once  . pantoprazole  40 mg Oral Daily   Continuous Infusions:  PRN Meds:.acetaminophen, ondansetron    PHYSICAL EXAM: Vital signs in last 24 hours: Filed Vitals:   03/28/14 1518 03/28/14 2255 03/29/14 0629 03/29/14 1401  BP: 134/65 151/80 138/81 154/77  Pulse: 79 71 75 74  Temp: 97.9 F (36.6 C) 98 F (36.7 C) 97.7 F (36.5 C) 97.7 F (36.5 C)  TempSrc: Oral Oral Oral Oral  Resp: 16 17 15    Height:      Weight:   67.268 kg (148 lb 4.8 oz)   SpO2: 93% 96% 95% 100%    Weight change: -1.432 kg (-3 lb 2.5 oz) Filed Weights   03/27/14 0449 03/28/14 0345 03/29/14 0629  Weight: 65.9 kg (145 lb 4.5 oz) 68.7 kg (151 lb 7.3 oz) 67.268 kg (148 lb 4.8 oz)   Body mass index is 27.12 kg/(m^2).   Gen Exam: Awake and alert with clear speech.   Neck: Supple, No JVD.  Chest: B/L Clear.   CVS: S1 S2 Regular, no murmurs.  Abdomen: soft, BS +, non tender, non distended. Extremities: no edema, lower extremities warm to touch. Neurologic: Non Focal.   Skin: No Rash.     Wounds: [name].    Intake/Output from previous day:  Intake/Output Summary (Last 24 hours) at 03/29/14 1702 Last data filed at 03/29/14 1444  Gross per 24 hour  Intake    480 ml  Output    150 ml  Net    330 ml     LAB RESULTS: CBC  Recent Labs Lab 03/25/14 0310 03/26/14 0505 03/27/14 0305 03/28/14 0335 03/29/14 0830  WBC 8.3 7.7 8.6 8.1 10.5  HGB 10.5* 11.1* 10.4* 10.3* 10.3*  HCT 31.7* 33.8* 31.6* 31.4* 30.6*  PLT 250 238 248 252 232  MCV 100.6* 98.5 99.1 98.7 100.0  MCH 33.3 32.4 32.6 32.4 33.7  MCHC 33.1 32.8 32.9 32.8 33.7  RDW 14.2 13.8 13.6 13.5 13.6  LYMPHSABS  --  1.3 1.3 1.4 1.5  MONOABS  --  0.8 0.9 0.8 1.2*  EOSABS  --  0.4 0.4 0.4 0.3  BASOSABS  --  0.0 0.0 0.0 0.0    Chemistries   Recent Labs Lab 03/25/14 0310 03/25/14 1608 03/26/14 0505 03/27/14 0305 03/28/14  0335 03/29/14 0650  NA 140 137 137 136 136 133*  K 2.9* 3.6 3.6 3.5 3.9 5.0  CL 91* 93* 96 98 101 99  CO2 41* 37* 36* 33* 32 27  GLUCOSE 152* 240* 130* 147* 152* 130*  BUN 50* 37* 25* 15 12 13   CREATININE 1.46* 1.14* 0.85 0.75 0.63 0.70  CALCIUM 8.1* 8.1* 8.2* 8.1* 8.2* 8.2*  MG 1.7  --  1.7 1.8 1.8 1.4*    CBG: No results for input(s): GLUCAP in the last 168 hours.  GFR Estimated Creatinine Clearance: 44.6 mL/min (by C-G formula based on Cr of 0.7).  Coagulation profile No results for input(s): INR, PROTIME in the last 168 hours.  Cardiac Enzymes  Recent Labs Lab 03/25/14 1600 03/25/14 2310 03/26/14 0505  TROPONINI 0.25* 0.24* 0.22*    Invalid input(s): POCBNP No results for input(s): DDIMER in the last 72 hours. No results for input(s): HGBA1C in the last 72 hours. No results for input(s): CHOL, HDL, LDLCALC, TRIG, CHOLHDL, LDLDIRECT in the last 72 hours. No results for input(s): TSH, T4TOTAL, T3FREE, THYROIDAB in the last 72 hours.  Invalid input(s): FREET3 No results for input(s): VITAMINB12, FOLATE, FERRITIN, TIBC, IRON, RETICCTPCT in the last 72 hours. No  results for input(s): LIPASE, AMYLASE in the last 72 hours.  Urine Studies No results for input(s): UHGB, CRYS in the last 72 hours.  Invalid input(s): UACOL, UAPR, USPG, UPH, UTP, UGL, UKET, UBIL, UNIT, UROB, ULEU, UEPI, UWBC, URBC, UBAC, CAST, UCOM, BILUA  MICROBIOLOGY: Recent Results (from the past 240 hour(s))  Blood culture (routine x 2)     Status: None   Collection Time: 03/21/14 12:40 PM  Result Value Ref Range Status   Specimen Description BLOOD RIGHT ARM  Final   Special Requests BOTTLES DRAWN AEROBIC AND ANAEROBIC 10 CC  Final   Culture   Final    NO GROWTH 5 DAYS Performed at [facility]    Report Status 03/27/2014 FINAL  Final  Blood culture (routine x 2)     Status: None   Collection Time: 03/21/14  1:00 PM  Result Value Ref Range Status   Specimen Description BLOOD RIGHT ARM  Final   Special Requests BOTTLES DRAWN AEROBIC AND ANAEROBIC 10 CC  Final   Culture   Final    NO GROWTH 5 DAYS Performed at [facility]    Report Status 03/27/2014 FINAL  Final  Urine culture     Status: None   Collection Time: 03/21/14  1:24 PM  Result Value Ref Range Status   Specimen Description URINE, CATHETERIZED  Final   Special Requests NONE  Final   Colony Count   Final    >=100,000 COLONIES/ML Performed at [facility]   Final    ESCHERICHIA COLI Performed at [facility]    Report Status 03/24/2014 FINAL  Final   Organism ID, Bacteria ESCHERICHIA COLI  Final      Susceptibility   Escherichia coli - MIC*    AMPICILLIN <=2 SENSITIVE Sensitive     CEFAZOLIN <=4 SENSITIVE Sensitive     CEFTRIAXONE <=1 SENSITIVE Sensitive     CIPROFLOXACIN <=0.25 SENSITIVE Sensitive     GENTAMICIN <=1 SENSITIVE Sensitive     LEVOFLOXACIN <=0.12 SENSITIVE Sensitive     NITROFURANTOIN <=16 SENSITIVE Sensitive     TOBRAMYCIN <=1 SENSITIVE Sensitive     TRIMETH/SULFA <=20 SENSITIVE Sensitive     PIP/TAZO <=4 SENSITIVE Sensitive     *  ESCHERICHIA  COLI  MRSA PCR Screening     Status: None   Collection Time: 03/21/14 10:57 PM  Result Value Ref Range Status   MRSA by PCR NEGATIVE NEGATIVE Final    Comment:        The GeneXpert MRSA Assay (FDA approved for NASAL specimens only), is one component of a comprehensive MRSA colonization surveillance program. It is not intended to diagnose MRSA infection nor to guide or monitor treatment for MRSA infections.     RADIOLOGY STUDIES/RESULTS: Dg Chest Port 1 View  03/21/2014   CLINICAL DATA:  Weakness  EXAM: PORTABLE CHEST - 1 VIEW  COMPARISON:  01/05/2014  FINDINGS: Chronic interstitial markings. No focal consolidation. No pleural effusion or pneumothorax.  The heart is normal in size. Postsurgical changes related to prior CABG.  IMPRESSION: No evidence of acute cardiopulmonary disease.   Electronically Signed   By: Julian Hy M.D.   On: 03/21/2014 13:38    Oren Binet, MD  [facility] Pager:[phone]  If 7PM-7AM, please contact night-coverage [email or web address] Password [email or web address] 03/29/2014, 5:02 PM   LOS: 8 days

## 2014-03-29 NOTE — Progress Notes (Signed)
NUTRITION FOLLOW UP  Pt meets criteria for moderate MALNUTRITION in the context of chronic illness as evidenced by mild-moderate depletion of muscle mass with 12% weight loss within 6 months.  Intervention:   -D/c Boost Plus, per pt request -Ensure Complete po BID, each supplement provides 350 kcal and 13 grams of protein  Nutrition Dx:   Malnutrition related to inadequate oral intake as evidenced by mild-moderate depletion of muscle mass with 12% weight loss within 6 months; progressing  Goal:   Intake to meet >90% of estimated nutrition needs; progressing  Monitor:   PO intake, labs, weight trend.  Assessment:   Patient admitted on 1/10 with 3-4 days of poor intake and progressive malaise and weakness. She had severe bradycardia and acute renal failure in the ED.  Pt reports her appetite has improved since last admission. Noted 50-100% meal completion, however, pt reports she is still unable to clear her plate, most often consuming about half of her meals. She reports she is drinking Boost when offered, however, wishes there was a wider variety of flavors. She is amenable to trying Ensure. Discussed importance of good PO and supplement intake to promote healing process. Per pt, she is going to be discharged to an SNF in [facility] tomorrow and she is very excited about this.  Na: 133, Calcium: 8.2, Mg: 1., Phos: 4.7, Glucose: 130.  Height: Ht Readings from Last 1 Encounters:  03/25/14 5\' 2"  (1.575 m)    Weight Status:   Wt Readings from Last 1 Encounters:  03/29/14 148 lb 4.8 oz (67.268 kg)   03/22/14 140 lb 9.6 oz (63.776 kg)     Re-estimated needs:  Kcal: 1600-1800 Protein: 74-84 grams Fluid: 1.6-1.8 L  Skin: stage I pressure ulcer on sacrum  Diet Order: Diet heart healthy/carb modified   Intake/Output Summary (Last 24 hours) at 03/29/14 1456 Last data filed at 03/29/14 1444  Gross per 24 hour  Intake    480 ml  Output    150 ml  Net    330 ml    Last  BM: 03/27/14   Labs:   Recent Labs Lab 03/24/14 0209  03/27/14 0305 03/28/14 0335 03/29/14 0650  NA 133*  < > 136 136 133*  K 2.8*  < > 3.5 3.9 5.0  CL 85*  < > 98 101 99  CO2 35*  < > 33* 32 27  BUN 72*  < > 15 12 13   CREATININE 2.90*  < > 0.75 0.63 0.70  CALCIUM 7.4*  < > 8.1* 8.2* 8.2*  MG 1.8  < > 1.8 1.8 1.4*  PHOS 4.7*  --   --   --   --   GLUCOSE 130*  < > 147* 152* 130*  < > = values in this interval not displayed.  CBG (last 3)  No results for input(s): GLUCAP in the last 72 hours.  Scheduled Meds: . aspirin EC  325 mg Oral Daily  . atorvastatin  80 mg Oral Daily  . carvedilol  3.125 mg Oral BID WC  . cefUROXime  250 mg Oral BID WC  . heparin  5,000 Units Subcutaneous 3 times per day  . lactose free nutrition  237 mL Oral BID BM  . levothyroxine  137 mcg Oral QAC breakfast  . lisinopril  10 mg Oral Daily  . pantoprazole  40 mg Oral Daily    Continuous Infusions:   Kannen Moxey A. Jimmye Norman, RD, LDN, CDE Pager: [phone] After hours Pager: [phone]

## 2014-03-29 NOTE — Progress Notes (Signed)
SUBJECTIVE:  She has no acute complaints.  She is not reporting chest pain or SOB.    PHYSICAL EXAM Filed Vitals:   03/28/14 1137 03/28/14 1518 03/28/14 2255 03/29/14 0629  BP: 142/77 134/65 151/80 138/81  Pulse: 74 79 71 75  Temp: 97.8 F (36.6 C) 97.9 F (36.6 C) 98 F (36.7 C) 97.7 F (36.5 C)  TempSrc: Oral Oral Oral Oral  Resp: 17 16 17 15   Height:      Weight:    148 lb 4.8 oz (67.268 kg)  SpO2: 100% 93% 96% 95%   General:  No distress Lungs:  Clear Heart:  RRR Abdomen:  Positive bowel sounds, no rebound no guarding Extremities:  No edema   LABS:  Results for orders placed or performed during the hospital encounter of 03/21/14 (from the past 24 hour(s))  Comprehensive metabolic panel     Status: Abnormal   Collection Time: 03/29/14  6:50 AM  Result Value Ref Range   Sodium 133 (L) 135 - 145 mmol/L   Potassium 5.0 3.5 - 5.1 mmol/L   Chloride 99 96 - 112 mEq/L   CO2 27 19 - 32 mmol/L   Glucose, Bld 130 (H) 70 - 99 mg/dL   BUN 13 6 - 23 mg/dL   Creatinine, Ser 0.70 0.50 - 1.10 mg/dL   Calcium 8.2 (L) 8.4 - 10.5 mg/dL   Total Protein 4.7 (L) 6.0 - 8.3 g/dL   Albumin 2.2 (L) 3.5 - 5.2 g/dL   AST 35 0 - 37 U/L   ALT 23 0 - 35 U/L   Alkaline Phosphatase 37 (L) 39 - 117 U/L   Total Bilirubin 0.9 0.3 - 1.2 mg/dL   GFR calc non Af Amer 76 (L) >90 mL/min   GFR calc Af Amer 88 (L) >90 mL/min   Anion gap 7 5 - 15  Magnesium     Status: Abnormal   Collection Time: 03/29/14  6:50 AM  Result Value Ref Range   Magnesium 1.4 (L) 1.5 - 2.5 mg/dL  CBC with Differential     Status: Abnormal   Collection Time: 03/29/14  8:30 AM  Result Value Ref Range   WBC 10.5 4.0 - 10.5 K/uL   RBC 3.06 (L) 3.87 - 5.11 MIL/uL   Hemoglobin 10.3 (L) 12.0 - 15.0 g/dL   HCT 30.6 (L) 36.0 - 46.0 %   MCV 100.0 78.0 - 100.0 fL   MCH 33.7 26.0 - 34.0 pg   MCHC 33.7 30.0 - 36.0 g/dL   RDW 13.6 11.5 - 15.5 %   Platelets 232 150 - 400 K/uL   Neutrophils Relative % 71 43 - 77 %   Neutro  Abs 7.4 1.7 - 7.7 K/uL   Lymphocytes Relative 15 12 - 46 %   Lymphs Abs 1.5 0.7 - 4.0 K/uL   Monocytes Relative 11 3 - 12 %   Monocytes Absolute 1.2 (H) 0.1 - 1.0 K/uL   Eosinophils Relative 3 0 - 5 %   Eosinophils Absolute 0.3 0.0 - 0.7 K/uL   Basophils Relative 0 0 - 1 %   Basophils Absolute 0.0 0.0 - 0.1 K/uL    Intake/Output Summary (Last 24 hours) at 03/29/14 1310 Last data filed at 03/29/14 0747  Gross per 24 hour  Intake      0 ml  Output    150 ml  Net   -150 ml     ASSESSMENT AND PLAN  ELEVATED TROPONIN:  No ischemia work  up planned.    She is s/p recent CABG.    CHRONIC DIASTOLIC HF:  EF was improved this admission.  She is to follow up with Dr. Sallyanne Kuster after discharge and had an appt scheduled.  I have asked my staff to move this back since she will be in rehab near [facility] for about 20 days.  She seems to be going home and should have daily weights, salt and fluid restriction.  She might need PRN Lasix for weight gain.    ATRIAL FIB:  Post op.  Off of amio.  No further work up.     Jeneen Rinks Kaizer Dissinger 03/29/2014 1:10 PM

## 2014-03-29 NOTE — Care Management Note (Unsigned)
    Page 1 of 1   03/29/2014     5:54:30 PM CARE MANAGEMENT NOTE 03/29/2014  Patient:  Marissa Jefferson, Marissa Jefferson   Account Number:  [id number]  Date Initiated:  03/26/2014  Documentation initiated by:  Marvetta Gibbons  Subjective/Objective Assessment:   Pt admitted with ARF     Action/Plan:   PTA pt was from home active with [facility] for [facility] services- plan is for SNF at discharge- CSW following   Anticipated DC Date:  03/30/2014   Anticipated DC Plan:  [place] referral  Clinical Social Worker      DC Planning Services  CM consult      Choice offered to / List presented to:             Status of service:  In process, will continue to follow Medicare Important Message given?  YES (If response is "NO", the following Medicare IM given date fields will be blank) Date Medicare IM given:  03/26/2014 Medicare IM given by:  Marvetta Gibbons Date Additional Medicare IM given:  03/29/2014 Additional Medicare IM given by:  [facility] Beonca Gibb  Discharge Disposition:    Per UR Regulation:  Reviewed for med. necessity/level of care/duration of stay  If discussed at [place] of Stay Meetings, dates discussed:    Comments:  03/29/14 [place], BSN [phone] patient was hypotensive, titrating bp meds.  plan is for snf tomorrow.

## 2014-03-29 NOTE — Progress Notes (Signed)
PT Cancellation Note  Patient Details Name: Marissa Jefferson MRN: [id number] DOB: Jan 26, 1925   Cancelled Treatment:    Reason Eval/Treat Not Completed: Patient declined, no reason specified.  Asked several times, several different ways.  Answer "not today". 03/29/2014  Donnella Sham, [place] [phone]  (pager)   Marissa Jefferson, [name] 03/29/2014, 4:01 PM

## 2014-03-30 LAB — GLUCOSE, CAPILLARY
GLUCOSE-CAPILLARY: 132 mg/dL — AB (ref 70–99)
GLUCOSE-CAPILLARY: 157 mg/dL — AB (ref 70–99)
Glucose-Capillary: 164 mg/dL — ABNORMAL HIGH (ref 70–99)

## 2014-03-30 LAB — BASIC METABOLIC PANEL
ANION GAP: 9 (ref 5–15)
BUN: 10 mg/dL (ref 6–23)
CALCIUM: 8.4 mg/dL (ref 8.4–10.5)
CO2: 25 mmol/L (ref 19–32)
Chloride: 104 mEq/L (ref 96–112)
Creatinine, Ser: 0.75 mg/dL (ref 0.50–1.10)
GFR calc Af Amer: 86 mL/min — ABNORMAL LOW (ref >90)
GFR calc non Af Amer: 74 mL/min — ABNORMAL LOW (ref >90)
Glucose, Bld: 136 mg/dL — ABNORMAL HIGH (ref 70–99)
POTASSIUM: 3.6 mmol/L (ref 3.5–5.1)
SODIUM: 138 mmol/L (ref 135–145)

## 2014-03-30 MED ORDER — LEVOTHYROXINE SODIUM 137 MCG PO TABS: 137.0000 ug | ORAL_TABLET | Freq: Every day | ORAL | Status: DC

## 2014-03-30 MED ORDER — CETAPHIL MOISTURIZING EX LOTN: TOPICAL_LOTION | Freq: Two times a day (BID) | CUTANEOUS | Status: DC

## 2014-03-30 MED ORDER — FUROSEMIDE 20 MG PO TABS: 20.0000 mg | ORAL_TABLET | Freq: Every day | ORAL | Status: DC | PRN

## 2014-03-30 MED ORDER — LISINOPRIL 10 MG PO TABS: 10.0000 mg | ORAL_TABLET | Freq: Every day | ORAL | Status: DC

## 2014-03-30 MED ORDER — CARVEDILOL 3.125 MG PO TABS: 3.1250 mg | ORAL_TABLET | Freq: Two times a day (BID) | ORAL | Status: DC

## 2014-03-30 MED ORDER — WHITE PETROLATUM GEL
Status: AC
  Administered 2014-03-30: 0.2
  Filled 2014-03-30: qty 1

## 2014-03-30 MED ORDER — CETAPHIL MOISTURIZING EX LOTN
TOPICAL_LOTION | Freq: Two times a day (BID) | CUTANEOUS | Status: DC
  Administered 2014-03-30: 10:00:00 via TOPICAL
  Filled 2014-03-30: qty 473

## 2014-03-30 NOTE — Clinical Social Work Note (Signed)
Clinical Social Work Department CLINICAL SOCIAL WORK PLACEMENT NOTE 03/25/2014  Patient: Marissa Jefferson, Marissa Jefferson Account Number: [id number] [place] date: 03/21/2014  Clinical Social Worker: Paulette Blanch BIBBS, LCSWA Date/time: 03/25/2014 03:13 PM  Clinical Social Work is seeking post-discharge placement for this patient at the following level of care: SKILLED NURSING (*CSW will update this form in Epic as items are completed)   03/25/2014 Patient/family provided with [facility] Department of Clinical Social Work's list of facilities offering this level of care within the geographic area requested by the patient (or if unable, by the patient's family).  03/25/2014 Patient/family informed of their freedom to choose among providers that offer the needed level of care, that participate in Medicare, Medicaid or managed care program needed by the patient, have an available bed and are willing to accept the patient.  03/25/2014 Patient/family informed of [facility]' ownership interest in [facility], as well as of the fact that they are under no obligation to receive care at this facility.  PASARR submitted to EDS on  PASARR number received on   FL2 transmitted to all facilities in geographic area requested by pt/family on 03/25/2014 FL2 transmitted to all facilities within larger geographic area on 03/25/2014  Patient informed that his/her managed care company has contracts with or will negotiate with certain facilities, including the following:    Patient/family informed of bed offers received: 03/26/2014  Patient chooses bed at [facility] Physician recommends and patient chooses bed at   Patient to be transferred to [facility] on 03/30/2014 Patient to be transferred to facility by Daughter Webb Silversmith will transport in personal vehicle Patient and family notified of transfer on 03/30/2014 Name of family member notified: Dalbert Mayotte  The following  physician request were entered in Epic:   Additional Comments:  Per MD patient ready for DC to [place], patient, patient's family, and facility notified of DC. RN given number for report. DC packet on chart. Daughter plans to transport the patient to facility. Daughter states that she will be paying privately for the patient at SNF if authorization is not obtained by facility at time of DC. CSW signing off.    Liz Beach MSW, [place], [place], [phone]

## 2014-03-30 NOTE — Progress Notes (Signed)
Occupational Therapy Treatment Patient Details Name: Marissa Jefferson MRN: [id number] DOB: 21-Jul-1924 Today's Date: 03/30/2014    History of present illness Marissa Jefferson is an 81 y.o. Female admitted 1/10/16with lethargy and weakness. PMH of CAD, CABG x4 (2015), ischemic cardiomyopathy, postop A. fib, DM, HTN. Pt was bradycardic and hypotensive in ED.    OT comments  Pt. Agreeable to participation in skilled OT today.  Progressing well compared to previous documentation.  Performing at a min a level for transfers, and S for bed mobility with increased time allowed.  Requiring max encouragement and instruction for safe completion of desired tasks.  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  Follow Up Recommendations  SNF;Supervision/Assistance - 24 hour    Equipment Recommendations  None recommended by OT          Precautions / Restrictions Precautions Precautions: Fall Restrictions Weight Bearing Restrictions: No       Mobility Bed Mobility Overal bed mobility: Needs Assistance Bed Mobility: Rolling;Sidelying to Sit;Supine to Sit Rolling: Min guard Sidelying to sit: Min guard Supine to sit: Min guard;HOB elevated     General bed mobility comments: increased time to perform, instructional and tactile cues provided to promote pt. completing the task without assistance  Transfers Overall transfer level: Needs assistance Equipment used: Rolling walker (2 wheeled) Transfers: Sit to/from [facility] Sit to Stand: Min assist Stand pivot transfers: Min assist       General transfer comment: Pt. required constant verbal and tactile guidance to initate pivotal steps and to maintain safe hand placement on walker and also when reaching back for arm rests on recliner    Balance     Sitting balance-Leahy Scale: Fair       Standing balance-Leahy Scale: Poor                     ADL Overall ADL's : Needs assistance/impaired                          Toilet Transfer: Minimal assistance;Stand-pivot;RW Toilet Transfer Details (indicate cue type and reason): simulated bsc transfer using transfer from eob to recliner.  requires cues for sequencing and hand placement during pivotal steps Toileting- Clothing Manipulation and Hygiene: Moderate assistance;Sit to/from stand Toileting - Clothing Manipulation Details (indicate cue type and reason): simulated during pivot transfer       General ADL Comments: doing better than previous documentation.  requires max encouragement due to her own percieved inability when she actually moves well                                      Cognition   Behavior During Therapy: Flat affect Overall Cognitive Status: No family/caregiver present to determine baseline cognitive functioning                                                  General Comments  spoke of her interest in "good" food but states it has been difficult to have the food she enjoys due to cooking for one person.  States she used to enjoy cooking.      Pertinent Vitals/ Pain       Pain Assessment: 0-10 Pain Score: 7  Pain Location: left  knee greater than right Pain Descriptors / Indicators: Aching Pain Intervention(s): Monitored during session;Repositioned;Premedicated before session  Home Living                                          Prior Functioning/Environment              Frequency Min 2X/week     Progress Toward Goals  OT Goals(current goals can now be found in the care plan section)  Progress towards OT goals: Progressing toward goals     Plan      Co-evaluation                 End of Session Equipment Utilized During Treatment: Gait belt;Rolling walker   Activity Tolerance Patient tolerated treatment well   Patient Left in chair;with call bell/phone within reach   Nurse Communication          Time: 1020-1043 OT Time Calculation (min): 23  min  Charges: OT General Charges $OT Visit: 1 Procedure OT Treatments $Self Care/Home Management : 23-37 mins  Janice Coffin, COTA/L 03/30/2014, 10:58 AM

## 2014-03-30 NOTE — Progress Notes (Signed)
Physical Therapy Treatment Patient Details Name: Marissa Jefferson MRN: [id number] DOB: April 26, 1924 Today's Date: 03/30/2014    History of Present Illness Marissa Jefferson is an 81 y.o. Female admitted 1/10/16with lethargy and weakness. PMH of CAD, CABG x4 (2015), ischemic cardiomyopathy, postop A. fib, DM, HTN. Pt was bradycardic and hypotensive in ED.     PT Comments    Progressing slowly.  Limited presently by mildly swollen and painful L knee which makes transfers and gait difficult.  Follow Up Recommendations  SNF;Supervision/Assistance - 24 hour     Equipment Recommendations  None recommended by PT    Recommendations for Other Services       Precautions / Restrictions Precautions Precautions: Fall    Mobility  Bed Mobility Overal bed mobility: Needs Assistance Bed Mobility: Sit to Supine Rolling: Min guard Sidelying to sit: Min guard Supine to sit: Min guard;HOB elevated Sit to supine: Min assist   General bed mobility comments: CUES for technque, assist LE's into bed.  assist to reposition/cue for bridging  Transfers Overall transfer level: Needs assistance Equipment used: Rolling walker (2 wheeled) Transfers: Sit to/from [facility] Sit to Stand: Mod assist;Min assist (mod assist from lower surface) Stand pivot transfers: Min assist       General transfer comment: Though min assist, transfer effortful due to L knee pain  Ambulation/Gait             General Gait Details: transfer only due to L knee pain   Stairs            Wheelchair Mobility    Modified Rankin (Stroke Patients Only)       Balance Overall balance assessment: Needs assistance Sitting-balance support: No upper extremity supported Sitting balance-Leahy Scale: Fair Sitting balance - Comments: can sit at EOB or bed, didn't challenge   Standing balance support: Bilateral upper extremity supported Standing balance-Leahy Scale: Poor Standing balance comment:  definite need for heavy use of the RW                    Cognition Arousal/Alertness: Awake/alert Behavior During Therapy: Flat affect Overall Cognitive Status: No family/caregiver present to determine baseline cognitive functioning     Current Attention Level: Sustained   Following Commands: Follows one step commands consistently;Follows one step commands with increased time     Problem Solving: Slow processing;Difficulty sequencing;Requires verbal cues;Requires tactile cues      Exercises      General Comments        Pertinent Vitals/Pain Pain Assessment: 0-10 Pain Score: 7  Pain Location: L knee Pain Descriptors / Indicators: Aching Pain Intervention(s): Limited activity within patient's tolerance;Monitored during session;Patient requesting pain meds-RN notified    Home Living                      Prior Function            PT Goals (current goals can now be found in the care plan section) Acute Rehab PT Goals PT Goal Formulation: With patient Time For Goal Achievement: 04/08/14 Potential to Achieve Goals: Good Progress towards PT goals: Progressing toward goals    Frequency  Min 2X/week    PT Plan Current plan remains appropriate    Co-evaluation             End of Session   Activity Tolerance: Patient limited by pain;Patient tolerated treatment well Patient left: in bed;with call bell/phone within reach     Time: 1139-1222 PT  Time Calculation (min) (ACUTE ONLY): 43 min  Charges:  $Therapeutic Exercise: 8-22 mins $Therapeutic Activity: 23-37 mins                    G Codes:      Janneth Krasner, [name] 03/30/2014, 12:47 PM    03/30/2014  Donnella Sham, [place] [phone]  (pager)

## 2014-03-30 NOTE — Clinical Social Work Note (Signed)
Per covering CSW from 03/29/14, patient refused to work with PT. Unfortunately updated PT and OT evaluations were needed so that authorization for SNF could be started. It's unlikely that authorization will be obtained today for DC as there are still no updated PT/OT notes. Covering CSW made daughter aware that patient will likely need to seek placement from home. CSW will offer LOG placement, but would still need PT and OT notes for this as well.    Liz Beach MSW, [place], [place], [phone]

## 2014-03-30 NOTE — Discharge Summary (Addendum)
Physician Discharge Summary  Marissa Jefferson OHY:[id number] DOB: 01-17-1925 DOA: 03/21/2014  PCP: Thressa Sheller, MD  Admit date: 03/21/2014 Discharge date: 03/30/2014  Time spent: 45 minutes  Recommendations for Outpatient Follow-up:  1.  Recent CABG with heart failure.  Will need daily weights, salt and fluid restriction.  Lasix PRN for weight gain. 2.  Amiodarone has been permanently discontinued. 3.  Titrate up lisinopril as patient's BP increases 4.  Recent Acute Renal Failure with ATN.  BMET / CBC in 3-5 days.  Recheck TSH in the next 6 weeks. 5.  D/C to SNF for physical therapy rehab.   Discharge Diagnoses:  Principal Problem:   Hypovolemic shock Active Problems:   Chronic systolic heart failure   HTN (hypertension)   Hyperlipidemia   Diabetes mellitus type 2, controlled   CAD (coronary artery disease), native coronary artery   Hypothyroidism   Bradycardia   Acute renal failure (ARF)   Hyperkalemia   Malnutrition of moderate degree   Cardiogenic shock   Elevated troponin   Paroxysmal atrial fibrillation   Pulmonary hypertension   Acute renal failure with tubular necrosis   Hypokalemia   Hypomagnesemia   Hyponatremia   Escherichia coli urinary tract infection   Metabolic encephalopathy   Other specified hypothyroidism   Discharge Condition: stable.  History of present illness:   Patient is 81 year old Caucasian female past medical history of coronary artery disease/CABG, ischemic cardiomyopathy, postoperative A. fib, hypertension and diabetes who presented on 03/21/14 with weakness and very poor oral intake. In the emergency room she was found to be severely bradycardic (sinus) and hypotensive. Labs showed acute renal failure with associated hyperkalemia and metabolic acidosis. She was admitted to the intensive care unit by [facility], cardiology was consulted. Amiodarone and beta blockers along with other hypertensive medications were discontinued. Patient was provided with  supportive care. With these measures, patient recovered with resolution of hypotension and bradycardia. Renal function gradually recovered, creatinine now back to normal. Patient continues to improve, plans are to discharge to SNF once bed is available.  Hospital Course:  Shock: Multifactorial from bradycardia (cardiogenic) and hypovolemic. Resolved with discontinuation of culprit medications and other supportive care. Blood pressure has actually increased, lisinopril, Coreg and furosemide have been restarted.  Active Problems:  Sinus bradycardia: Likely related to medications-beta blocker and amiodarone. This has now resolved, amiodarone permanently discontinued, low-dose Coreg restarted. TSH elevated at 10, patient already on levothyroxine-dose has been increased 237 mcg-prior dosing was 112 mcg. Will need to recheck TSH in the next 6 weeks.  Elevated troponin: Secondary to Demand ischemia. Does have a history of CAD status post CABG, no further workup required a cardiology.  Acute renal failure: Secondary to acute tubal necrosis in the setting of hypotension/shock/lisinopril use. Resolved with supportive care.  Parox Afib post CABG: Remain off amiodarone, stable with Coreg. No further recommendations from cardiology at this point  CAD status post CABG: Continue aspirin and statin, once bradycardia resolved, low dose Coreg was restarted and patient seems to be tolerating this well.  Pan sensitive E coli UTI: Has completed a course of antibiotics, will stop Ceftin on 1/18.   Chronic ischemic cardiomyopathy:  EF as low as 20-25% in July 2015 but has since recovered - 55% by echo this admission. Remains clinically compensated, restarted on low-dose Lasix.   Hyperkalemia: This was during the initial part of her hospitalization, likely secondary to acute renal failure and this inappropriate use. This has resolved. Once renal failure had resolved, patient was restarted on lisinopril.  Potassium remains stable   Encephalopathy: This was likely secondary to multiple electrolyte abnormalities, this has resolved. Patient is completely awake and alert.   History of hypertension: Controlled with lisinopril, Coreg and low-dose Lasix. Follow BP.   History of diabetes: resume metformin on discharge.   Procedures:  2d Echo  Study Conclusions  - Left ventricle: Inferobasal hypokinesis The cavity size was normal. Wall thickness was increased in a pattern of moderate LVH. The estimated ejection fraction was 55%. - Aortic valve: small systolic gradient with no significant stenosis - Mitral valve: There was mild regurgitation. - Right ventricle: The cavity size was mildly dilated. - Atrial septum: No defect or patent foramen ovale was identified. - Pulmonary arteries: PA peak pressure: 46 mm Hg (S).  Consultations:  Nephrology  Cardiology  Initially on PCCM Service.  Discharge Exam: Filed Vitals:   03/29/14 0629 03/29/14 1401 03/29/14 2159 03/30/14 0501  BP: 138/81 154/77 139/63 145/76  Pulse: 75 74  85  Temp: 97.7 F (36.5 C) 97.7 F (36.5 C) 97.6 F (36.4 C) 98.1 F (36.7 C)  TempSrc: Oral Oral Oral Oral  Resp: 15  15 21   Height:      Weight: 67.268 kg (148 lb 4.8 oz)   65.7 kg (144 lb 13.5 oz)  SpO2: 95% 100% 97% 93%   Filed Weights   03/28/14 0345 03/29/14 0629 03/30/14 0501  Weight: 68.7 kg (151 lb 7.3 oz) 67.268 kg (148 lb 4.8 oz) 65.7 kg (144 lb 13.5 oz)     Gen Exam: Awake and alert with clear speech. Lying comfortably in bed.  NAD. Neck: Supple, No JVD.  Chest: B/L Clear. no w/c/r CVS: S1 S2 Regular, no murmurs.  Abdomen: soft, BS +, non tender, non distended. No masses. Extremities: no edema, lower extremities warm to touch. 5/5 strength in each. Neurologic: Non Focal.    Discharge Instructions  Diet recommendation:   Discharge Instructions    Diet - low sodium heart healthy    Complete by:  As directed      Increase  activity slowly    Complete by:  As directed           Current Discharge Medication List    START taking these medications   Details  cetaphil (CETAPHIL) lotion Apply topically 2 (two) times daily. Qty: 236 mL, Refills: 0    furosemide (LASIX) 20 MG tablet Take 1 tablet (20 mg total) by mouth daily as needed. Qty: 30 tablet    lisinopril (PRINIVIL,ZESTRIL) 10 MG tablet Take 1 tablet (10 mg total) by mouth daily.      CONTINUE these medications which have CHANGED   Details  carvedilol (COREG) 3.125 MG tablet Take 1 tablet (3.125 mg total) by mouth 2 (two) times daily.    levothyroxine (SYNTHROID, LEVOTHROID) 137 MCG tablet Take 1 tablet (137 mcg total) by mouth daily before breakfast.      CONTINUE these medications which have NOT CHANGED   Details  acetaminophen (TYLENOL) 500 MG chewable tablet Chew 1,000 mg by mouth 2 (two) times daily.    aspirin EC 325 MG EC tablet Take 1 tablet (325 mg total) by mouth daily. Qty: 30 tablet, Refills: 0    atorvastatin (LIPITOR) 80 MG tablet Take 1 tablet by mouth every evening.     cholecalciferol (VITAMIN D) 1000 UNITS tablet Take 1,000 Units by mouth daily.    feeding supplement, GLUCERNA SHAKE, (GLUCERNA SHAKE) LIQD Take 237 mLs by mouth 2 (two) times daily between meals. Refills:  0    ferrous sulfate 325 (65 FE) MG tablet Take 1 tablet (325 mg total) by mouth daily with breakfast. Refills: 3    Magnesium Oxide 400 (240 MG) MG TABS TAKE 1 TABLET TWICE DAILY. Qty: 60 tablet, Refills: 9    metFORMIN (GLUCOPHAGE) 1000 MG tablet Take 1 tablet by mouth 2 (two) times daily.    oxybutynin (DITROPAN XL) 15 MG 24 hr tablet Take 2 tablets by mouth at bedtime.     pantoprazole (PROTONIX) 40 MG tablet Take 1 tablet by mouth every evening.     VOLTAREN 1 % GEL Apply 1 application topically daily as needed (knee pain).       STOP taking these medications     amiodarone (PACERONE) 200 MG tablet      lisinopril-hydrochlorothiazide  (PRINZIDE,ZESTORETIC) 20-12.5 MG per tablet        No Known Allergies Follow-up Information    Follow up with Sanda Klein, MD On 05/10/2014.   Specialty:  Cardiology   Why:  11:30am   Contact information:   [address] [place] [address] [phone]        The results of significant diagnostics from this hospitalization (including imaging, microbiology, ancillary and laboratory) are listed below for reference.    Significant Diagnostic Studies: Dg Chest Port 1 View  03/21/2014   CLINICAL DATA:  Weakness  EXAM: PORTABLE CHEST - 1 VIEW  COMPARISON:  01/05/2014  FINDINGS: Chronic interstitial markings. No focal consolidation. No pleural effusion or pneumothorax.  The heart is normal in size. Postsurgical changes related to prior CABG.  IMPRESSION: No evidence of acute cardiopulmonary disease.   Electronically Signed   By: Julian Hy M.D.   On: 03/21/2014 13:38    Microbiology: Recent Results (from the past 240 hour(s))  Blood culture (routine x 2)     Status: None   Collection Time: 03/21/14 12:40 PM  Result Value Ref Range Status   Specimen Description BLOOD RIGHT ARM  Final   Special Requests BOTTLES DRAWN AEROBIC AND ANAEROBIC 10 CC  Final   Culture   Final    NO GROWTH 5 DAYS Performed at [facility]    Report Status 03/27/2014 FINAL  Final  Blood culture (routine x 2)     Status: None   Collection Time: 03/21/14  1:00 PM  Result Value Ref Range Status   Specimen Description BLOOD RIGHT ARM  Final   Special Requests BOTTLES DRAWN AEROBIC AND ANAEROBIC 10 CC  Final   Culture   Final    NO GROWTH 5 DAYS Performed at [facility]    Report Status 03/27/2014 FINAL  Final  Urine culture     Status: None   Collection Time: 03/21/14  1:24 PM  Result Value Ref Range Status   Specimen Description URINE, CATHETERIZED  Final   Special Requests NONE  Final   Colony Count   Final    >=100,000 COLONIES/ML Performed at [facility]    Culture   Final    ESCHERICHIA COLI Performed at [facility]    Report Status 03/24/2014 FINAL  Final   Organism ID, Bacteria ESCHERICHIA COLI  Final      Susceptibility   Escherichia coli - MIC*    AMPICILLIN <=2 SENSITIVE Sensitive     CEFAZOLIN <=4 SENSITIVE Sensitive     CEFTRIAXONE <=1 SENSITIVE Sensitive     CIPROFLOXACIN <=0.25 SENSITIVE Sensitive     GENTAMICIN <=1 SENSITIVE Sensitive  LEVOFLOXACIN <=0.12 SENSITIVE Sensitive     NITROFURANTOIN <=16 SENSITIVE Sensitive     TOBRAMYCIN <=1 SENSITIVE Sensitive     TRIMETH/SULFA <=20 SENSITIVE Sensitive     PIP/TAZO <=4 SENSITIVE Sensitive     * ESCHERICHIA COLI  MRSA PCR Screening     Status: None   Collection Time: 03/21/14 10:57 PM  Result Value Ref Range Status   MRSA by PCR NEGATIVE NEGATIVE Final    Comment:        The GeneXpert MRSA Assay (FDA approved for NASAL specimens only), is one component of a comprehensive MRSA colonization surveillance program. It is not intended to diagnose MRSA infection nor to guide or monitor treatment for MRSA infections.      Labs: Basic Metabolic Panel:  Recent Labs Lab 03/24/14 0209  03/25/14 0310  03/26/14 0505 03/27/14 0305 03/28/14 0335 03/29/14 0650 03/30/14 0520  NA 133*  < > 140  < > 137 136 136 133* 138  K 2.8*  < > 2.9*  < > 3.6 3.5 3.9 5.0 3.6  CL 85*  < > 91*  < > 96 98 101 99 104  CO2 35*  < > 41*  < > 36* 33* 32 27 25  GLUCOSE 130*  < > 152*  < > 130* 147* 152* 130* 136*  BUN 72*  < > 50*  < > 25* 15 12 13 10   CREATININE 2.90*  < > 1.46*  < > 0.85 0.75 0.63 0.70 0.75  CALCIUM 7.4*  < > 8.1*  < > 8.2* 8.1* 8.2* 8.2* 8.4  MG 1.8  --  1.7  --  1.7 1.8 1.8 1.4*  --   PHOS 4.7*  --   --   --   --   --   --   --   --   < > = values in this interval not displayed. Liver Function Tests:  Recent Labs Lab 03/25/14 0310 03/26/14 0505 03/27/14 0305 03/28/14 0335 03/29/14 0650  AST 42* 37 29 30 35  ALT 28 26 24 23 23   ALKPHOS 40  41 39 38* 37*  BILITOT 0.3 0.5 0.4 0.6 0.9  PROT 4.5* 4.9* 4.6* 4.6* 4.7*  ALBUMIN 2.3* 2.4* 2.3* 2.2* 2.2*   No results for input(s): LIPASE, AMYLASE in the last 168 hours. No results for input(s): AMMONIA in the last 168 hours. CBC:  Recent Labs Lab 03/25/14 0310 03/26/14 0505 03/27/14 0305 03/28/14 0335 03/29/14 0830  WBC 8.3 7.7 8.6 8.1 10.5  NEUTROABS  --  5.2 6.1 5.5 7.4  HGB 10.5* 11.1* 10.4* 10.3* 10.3*  HCT 31.7* 33.8* 31.6* 31.4* 30.6*  MCV 100.6* 98.5 99.1 98.7 100.0  PLT 250 238 248 252 232   Cardiac Enzymes:  Recent Labs Lab 03/25/14 1600 03/25/14 2310 03/26/14 0505  TROPONINI 0.25* 0.24* 0.22*   BNP: BNP (last 3 results)  Recent Labs  10/14/13 0350  PROBNP 2044.0*   CBG:  Recent Labs Lab 03/29/14 2155 03/30/14 0753  GLUCAP 166* 132*    Signed: Melton Alar, PA-C  [facility] 03/30/2014, 11:52 AM  Attending Patient was seen, examined,treatment plan was discussed with the  Advance Practice Provider.  I have directly reviewed the clinical findings, lab, imaging studies and management of this patient in detail. I have made the necessary changes to the above noted documentation, and agree with the documentation, as recorded by the Advance Practice Provider.   Remain stable, electrolytes stable this morning. Rest as above, okay for  discharge to SNF. Patient agreeable.  Nena Alexander MD [facility].

## 2014-04-14 ENCOUNTER — Ambulatory Visit: Payer: Medicare Other | Admitting: Cardiovascular Disease

## 2014-05-10 ENCOUNTER — Encounter: Payer: Self-pay | Admitting: Cardiovascular Disease

## 2014-05-10 ENCOUNTER — Ambulatory Visit (INDEPENDENT_AMBULATORY_CARE_PROVIDER_SITE_OTHER): Payer: Medicare HMO | Admitting: Cardiovascular Disease

## 2014-05-10 VITALS — BP 170/100 | HR 84 | Ht 65.0 in | Wt 135.5 lb

## 2014-05-10 DIAGNOSIS — E038 Other specified hypothyroidism: Secondary | ICD-10-CM

## 2014-05-10 DIAGNOSIS — I255 Ischemic cardiomyopathy: Secondary | ICD-10-CM

## 2014-05-10 DIAGNOSIS — R5383 Other fatigue: Secondary | ICD-10-CM

## 2014-05-10 DIAGNOSIS — E1169 Type 2 diabetes mellitus with other specified complication: Secondary | ICD-10-CM

## 2014-05-10 DIAGNOSIS — I480 Paroxysmal atrial fibrillation: Secondary | ICD-10-CM

## 2014-05-10 DIAGNOSIS — I10 Essential (primary) hypertension: Secondary | ICD-10-CM

## 2014-05-10 DIAGNOSIS — E785 Hyperlipidemia, unspecified: Secondary | ICD-10-CM

## 2014-05-10 DIAGNOSIS — I2510 Atherosclerotic heart disease of native coronary artery without angina pectoris: Secondary | ICD-10-CM

## 2014-05-10 DIAGNOSIS — Z79899 Other long term (current) drug therapy: Secondary | ICD-10-CM

## 2014-05-10 DIAGNOSIS — Z951 Presence of aortocoronary bypass graft: Secondary | ICD-10-CM

## 2014-05-10 DIAGNOSIS — I493 Ventricular premature depolarization: Secondary | ICD-10-CM

## 2014-05-10 HISTORY — DX: Ventricular premature depolarization: I49.3

## 2014-05-10 LAB — CBC
HCT: 33.6 % — ABNORMAL LOW (ref 36.0–46.0)
HEMOGLOBIN: 11.1 g/dL — AB (ref 12.0–15.0)
MCH: 33 pg (ref 26.0–34.0)
MCHC: 33 g/dL (ref 30.0–36.0)
MCV: 100 fL (ref 78.0–100.0)
MPV: 9.9 fL (ref 8.6–12.4)
Platelets: 353 10*3/uL (ref 150–400)
RBC: 3.36 MIL/uL — AB (ref 3.87–5.11)
RDW: 13.8 % (ref 11.5–15.5)
WBC: 9.2 10*3/uL (ref 4.0–10.5)

## 2014-05-10 MED ORDER — CARVEDILOL 12.5 MG PO TABS: 12.5000 mg | ORAL_TABLET | Freq: Two times a day (BID) | ORAL | Status: DC

## 2014-05-10 NOTE — Progress Notes (Signed)
Patient ID: Marissa Jefferson, female   DOB: [birth year], 81 y.o.   MRN: [id number]      Reason for office visit CAD s/p CABG  Marissa Jefferson is now roughly 6 months status post coronary artery bypass surgery performed for multivessel coronary artery disease with severe ischemic cardiomyopathy and an ejection fraction of 20-25%. She had postoperative atrial fibrillation. She has had remarkable improvement in left ventricular systolic function following bypass surgery. Most recently her EF was estimated to be 55%. Postoperative recovery was slow, but steady until a fall in early January. She was on the ground immobilized for roughly 14 hours and presented with bradycardia, acute renal failure and confusion. Amiodarone was discontinued. She has recovered renal function back to her baseline. Levothyroxine dose was increased on discharge from the hospital (TSH 10.01), not reevaluated since. Her carvedilol dose was also decreased at the time of discharge from the hospital, probably due to bradycardia. Her daughter increase the dose of carvedilol to 6.25 mg twice daily for persistent hypertension. She has been keeping a very close eye on her mother's vital signs. Typically her blood pressure be around 150/19 was Hg and the heart rate will be in the 70s and 80s. At times, her blood pressure is even higher. The patient denies problems with dyspnea or angina and is participating in rehabilitation. She has very mild ankle swelling. Her podiatrist suggested that she needs ankle brachial indices checked. Inconsistently taking iron supplements due to stomach upset.   No Known Allergies  Current Outpatient Prescriptions  Medication Sig Dispense Refill  . acetaminophen (TYLENOL) 500 MG chewable tablet Chew 1,000 mg by mouth 2 (two) times daily.    Marland Kitchen aspirin EC 325 MG EC tablet Take 1 tablet (325 mg total) by mouth daily. 30 tablet 0  . atorvastatin (LIPITOR) 80 MG tablet Take 1 tablet by mouth every evening.     . Biotin  2500 MCG CAPS Take 1 capsule by mouth daily.    . carvedilol (COREG) 6.25 MG tablet Take 6.25 mg by mouth 2 (two) times daily with a meal.    . cetaphil (CETAPHIL) lotion Apply topically 2 (two) times daily. 236 mL 0  . cholecalciferol (VITAMIN D) 1000 UNITS tablet Take 1,000 Units by mouth daily.    Marissa Jefferson Calcium (STOOL SOFTENER PO) Take 1 tablet by mouth daily as needed.    . feeding supplement, GLUCERNA SHAKE, (GLUCERNA SHAKE) LIQD Take 237 mLs by mouth 2 (two) times daily between meals.  0  . ferrous sulfate 325 (65 FE) MG tablet Take 1 tablet (325 mg total) by mouth daily with breakfast.  3  . levothyroxine (SYNTHROID, LEVOTHROID) 137 MCG tablet Take 1 tablet (137 mcg total) by mouth daily before breakfast.    . lisinopril (PRINIVIL,ZESTRIL) 10 MG tablet Take 1 tablet (10 mg total) by mouth daily.    . Magnesium Oxide 400 (240 MG) MG TABS TAKE 1 TABLET TWICE DAILY. 60 tablet 9  . metFORMIN (GLUCOPHAGE) 1000 MG tablet Take 1 tablet by mouth 2 (two) times daily.    . Multiple Vitamin (MULTIVITAMIN WITH MINERALS) TABS tablet Take 1 tablet by mouth daily.    . ondansetron (ZOFRAN) 4 MG tablet Take 1 tablet by mouth as needed.    Marland Kitchen oxybutynin (DITROPAN XL) 15 MG 24 hr tablet Take 2 tablets by mouth at bedtime.     . pantoprazole (PROTONIX) 40 MG tablet Take 1 tablet by mouth every evening.     Marland Kitchen VOLTAREN 1 % GEL Apply  1 application topically daily as needed (knee pain).      No current facility-administered medications for this visit.    Past Medical History  Diagnosis Date  . DM2 (diabetes mellitus, type 2)   . Hypertension   . Hyperlipidemia   . GERD (gastroesophageal reflux disease)   . Osteopenia   . CAD (coronary artery disease), native coronary artery 10/06/2013    LAD 90%, CFX 90%, RCA 100% w/ collat  . Ischemic dilated cardiomyopathy 09/2013    EF 20-25% by echo  . Chronic combined systolic and diastolic CHF, NYHA class 2 09/2013  . NSVT (nonsustained ventricular  tachycardia) 09/2013  . OAB (overactive bladder)   . Hypothyroidism   . CVA (cerebral vascular accident) 1968    leaving no deficit  . Arthritis     "knees" (10/08/2013)  . Breast cancer     "left; I had 62 sessions of radiation"  . 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 Colonoscopy  2005  . Cardiac catheterization  10/06/2013  . Cataract extraction w/ intraocular lens  implant, bilateral Bilateral ~ 2009  . Tonsillectomy and adenoidectomy  1930's  . Breast lumpectomy Left 1998  . Breast lumpectomy with axillary lymph node dissection Left 1998  . Coronary artery bypass graft [name] 10/09/2013    Procedure: CORONARY ARTERY BYPASS GRAFTING (CABG) x 4 using left internal mammary artery and right saphenous leg vein using endoscope.;  Surgeon: Melrose Nakayama, MD;  Location: [facility];  Service: Open Heart Surgery;  Laterality: [name];  . Intraoperative transesophageal echocardiogram [name] 10/09/2013    Procedure: INTRAOPERATIVE TRANSESOPHAGEAL ECHOCARDIOGRAM;  Surgeon: Melrose Nakayama, MD;  Location: [facility];  Service: Open Heart Surgery;  Laterality: [name];  . Left and right heart catheterization with coronary angiogram [name] 10/06/2013    Procedure: LEFT AND RIGHT HEART CATHETERIZATION WITH CORONARY ANGIOGRAM;  Surgeon: Sanda Klein, MD;  Location: [facility] CATH LAB;  Service: Cardiovascular;  Laterality: [name];    No family history on file.  History   Social History  . Marital Status: Widowed    Spouse Name: [name]  . Number of Children: [name]  . Years of Education: [name]   Occupational History  . Not on file.   Social History Main Topics  . Smoking status: Former Smoker    Types: Cigarettes  . Smokeless tobacco: Never Used     Comment: "smoked in college; not much; not long"  . Alcohol Use: No  . Drug Use: No  . Sexual Activity: Not Currently   Other Topics Concern  . Not on file   Social  History Narrative    Review of systems: The patient specifically denies any chest pain at rest or with exertion, dyspnea at rest or with exertion, orthopnea, paroxysmal nocturnal dyspnea, syncope, palpitations, focal neurological deficits, intermittent claudication, lower extremity edema, unexplained weight gain, cough, hemoptysis or wheezing.  The patient also denies abdominal pain, nausea, vomiting, dysphagia, diarrhea, constipation, polyuria, polydipsia, dysuria, hematuria, frequency, urgency, abnormal bleeding or bruising, fever, chills, unexpected weight changes, mood swings, change in skin or hair texture, change in voice quality, auditory or visual problems, allergic reactions or rashes, new musculoskeletal complaints other than usual "aches and pains".   PHYSICAL EXAM BP 170/100 mmHg  Pulse 84  Ht 5\' 5"  (1.651 m)  Wt 135 lb 8 oz (61.462 kg)  BMI 22.55 kg/m2 Rechecked BP 142/91 General: Alert, oriented x3, no distress Head: no  evidence of trauma, PERRL, EOMI, no exophtalmos or lid lag, no myxedema, no xanthelasma; normal ears, nose and oropharynx Neck: normal jugular venous pulsations and no hepatojugular reflux; brisk carotid pulses without delay and no carotid bruits Chest: clear to auscultation, no signs of consolidation by percussion or palpation, normal fremitus, symmetrical and full respiratory excursions Cardiovascular: normal position and quality of the apical impulse, regular rhythm, normal first and second heart sounds, no murmurs, rubs or gallops Abdomen: no tenderness or distention, no masses by palpation, no abnormal pulsatility or arterial bruits, normal bowel sounds, no hepatosplenomegaly Extremities: no clubbing, cyanosis or edema; 2+ radial, ulnar and brachial pulses bilaterally; 2+ right femoral, posterior tibial and dorsalis pedis pulses; 2+ left femoral, posterior tibial and dorsalis pedis pulses; no subclavian or femoral bruits Neurological: grossly  nonfocal   EKG: Sinus rhythm with frequent monomorphic PVCs (right ventricular tract morphology, transition in lead V1-V2) no repolarization abnormalities  Lipid Panel     Component Value Date/Time   CHOL 148 10/07/2013 0350   TRIG 111 10/07/2013 0350   HDL 40 10/07/2013 0350   CHOLHDL 3.7 10/07/2013 0350   VLDL 22 10/07/2013 0350   LDLCALC 86 10/07/2013 0350    BMET    Component Value Date/Time   NA 138 03/30/2014 0520   K 3.6 03/30/2014 0520   CL 104 03/30/2014 0520   CO2 25 03/30/2014 0520   GLUCOSE 136* 03/30/2014 0520   BUN 10 03/30/2014 0520   CREATININE 0.75 03/30/2014 0520   CREATININE 0.96 10/05/2013 1108   CALCIUM 8.4 03/30/2014 0520   GFRNONAA 74* 03/30/2014 0520   GFRNONAA 54* 10/05/2013 1108   GFRAA 86* 03/30/2014 0520   GFRAA 62 10/05/2013 1108     ASSESSMENT AND PLAN CAD s/p CABG She has never had angina pectoris. Her presentation was with exercise intolerance due to dyspnea. Remarkable improvement in left ventricular systolic function, consistent with normalization of hibernating myocardium. Nevertheless would continue ace inhibitors and beta blockers as the primary way to treat her hypertension HTN Blood pressure is high. The bradycardia was probably related primarily to amiodarone. Increase carvedilol to 12.5 mg twice a day RVOT PVCs May or may not respond to higher dose of beta blocker. The extra beats are not symptomatic and did not require specific therapy Hyperlipidemia Time to evaluate lipid profile. Mild type 2 diabetes mellitus. Also check hemoglobin A1c Postoperative atrial fibrillation No clinically evident recurrence since the immediate postop period. No longer on warfarin. Continue aspirin. Hypothyroidism TSH abnormalities may have been a sign of sick euthyroid syndrome or a side effect of amiodarone. Reevaluate TSH.  Orders Placed This Encounter  Procedures  . CBC  . Comprehensive metabolic panel  . Hemoglobin A1c  . Lipid panel  .  TSH  . EKG 12-Lead   Patient Instructions  Your physician recommends that you return for lab work in: TODAY  Dr. Sallyanne Kuster recommends that you schedule a follow-up appointment in: 3 months      Meds ordered this encounter  Medications  . carvedilol (COREG) 6.25 MG tablet    Sig: Take 6.25 mg by mouth 2 (two) times daily with a meal.  . Docusate Calcium (STOOL SOFTENER PO)    Sig: Take 1 tablet by mouth daily as needed.  . Multiple Vitamin (MULTIVITAMIN WITH MINERALS) TABS tablet    Sig: Take 1 tablet by mouth daily.  . Biotin 2500 MCG CAPS    Sig: Take 1 capsule by mouth daily.  . ondansetron (ZOFRAN) 4 MG tablet  Sig: Take 1 tablet by mouth as needed.    Holli Humbles, MD, [place] [phone] office [phone] pager

## 2014-05-10 NOTE — Patient Instructions (Addendum)
Your physician recommends that you return for lab work in: TODAY  Dr. Sallyanne Kuster recommends that you schedule a follow-up appointment in: 3 months  INCREASE Carvedilol to 12.5mg  twice a day.

## 2014-05-11 ENCOUNTER — Telehealth: Payer: Self-pay | Admitting: *Deleted

## 2014-05-11 DIAGNOSIS — E038 Other specified hypothyroidism: Secondary | ICD-10-CM

## 2014-05-11 LAB — LIPID PANEL
CHOL/HDL RATIO: 2.9 ratio
Cholesterol: 135 mg/dL (ref 0–200)
HDL: 47 mg/dL (ref >=46)
LDL Cholesterol: 60 mg/dL (ref 0–99)
Triglycerides: 138 mg/dL (ref <150)
VLDL: 28 mg/dL (ref 0–40)

## 2014-05-11 LAB — COMPREHENSIVE METABOLIC PANEL
ALK PHOS: 47 U/L (ref 39–117)
ALT: 12 U/L (ref 0–35)
AST: 17 U/L (ref 0–37)
Albumin: 3.8 g/dL (ref 3.5–5.2)
BILIRUBIN TOTAL: 0.4 mg/dL (ref 0.2–1.2)
BUN: 26 mg/dL — AB (ref 6–23)
CO2: 23 mEq/L (ref 19–32)
Calcium: 9.6 mg/dL (ref 8.4–10.5)
Chloride: 99 mEq/L (ref 96–112)
Creat: 0.65 mg/dL (ref 0.50–1.10)
GLUCOSE: 102 mg/dL — AB (ref 70–99)
Potassium: 4 mEq/L (ref 3.5–5.3)
SODIUM: 139 meq/L (ref 135–145)
TOTAL PROTEIN: 6 g/dL (ref 6.0–8.3)

## 2014-05-11 LAB — TSH: TSH: 4.698 u[IU]/mL — ABNORMAL HIGH (ref 0.350–4.500)

## 2014-05-11 LAB — HEMOGLOBIN A1C
HEMOGLOBIN A1C: 5.5 % (ref <5.7)
MEAN PLASMA GLUCOSE: 111 mg/dL (ref <117)

## 2014-05-11 NOTE — Telephone Encounter (Signed)
-----   Message from Sanda Klein, MD sent at 05/11/2014  8:38 AM EST ----- Cholesterol and glucose control are great. TSH is borderline high and improving (it appears this may actually be the correct levothyroxine dose after all, would recheck in 2-3 months). Minimal anemia. Please make sure copy to Dr. Noah Delaine

## 2014-05-11 NOTE — Telephone Encounter (Signed)
LM with lab results on cell phone.  No answer at home number.   Order placed and mailed for recheck in 2-3 months.

## 2014-05-13 DIAGNOSIS — I2510 Atherosclerotic heart disease of native coronary artery without angina pectoris: Secondary | ICD-10-CM | POA: Diagnosis not present

## 2014-05-13 DIAGNOSIS — I5032 Chronic diastolic (congestive) heart failure: Secondary | ICD-10-CM | POA: Diagnosis not present

## 2014-05-13 DIAGNOSIS — I10 Essential (primary) hypertension: Secondary | ICD-10-CM | POA: Diagnosis not present

## 2014-05-13 DIAGNOSIS — E611 Iron deficiency: Secondary | ICD-10-CM | POA: Diagnosis not present

## 2014-06-18 ENCOUNTER — Other Ambulatory Visit: Payer: Self-pay | Admitting: Cardiovascular Disease

## 2014-08-13 ENCOUNTER — Ambulatory Visit: Payer: Medicare HMO | Admitting: Cardiovascular Disease

## 2014-09-15 ENCOUNTER — Encounter: Payer: Self-pay | Admitting: Cardiovascular Disease

## 2014-09-15 ENCOUNTER — Ambulatory Visit (INDEPENDENT_AMBULATORY_CARE_PROVIDER_SITE_OTHER): Payer: Medicare HMO | Admitting: Cardiovascular Disease

## 2014-09-15 VITALS — BP 142/94 | HR 76 | Ht 60.0 in | Wt 155.0 lb

## 2014-09-15 DIAGNOSIS — I472 Ventricular tachycardia: Secondary | ICD-10-CM

## 2014-09-15 DIAGNOSIS — I25118 Atherosclerotic heart disease of native coronary artery with other forms of angina pectoris: Secondary | ICD-10-CM

## 2014-09-15 DIAGNOSIS — Z79899 Other long term (current) drug therapy: Secondary | ICD-10-CM

## 2014-09-15 DIAGNOSIS — R0602 Shortness of breath: Secondary | ICD-10-CM | POA: Diagnosis not present

## 2014-09-15 DIAGNOSIS — I272 Pulmonary hypertension, unspecified: Secondary | ICD-10-CM

## 2014-09-15 DIAGNOSIS — I4729 Other ventricular tachycardia: Secondary | ICD-10-CM

## 2014-09-15 DIAGNOSIS — I480 Paroxysmal atrial fibrillation: Secondary | ICD-10-CM

## 2014-09-15 DIAGNOSIS — I270 Primary pulmonary hypertension: Secondary | ICD-10-CM

## 2014-09-15 DIAGNOSIS — I5043 Acute on chronic combined systolic (congestive) and diastolic (congestive) heart failure: Secondary | ICD-10-CM

## 2014-09-15 MED ORDER — FUROSEMIDE 40 MG PO TABS: 40.0000 mg | ORAL_TABLET | Freq: Every day | ORAL | Status: DC

## 2014-09-15 MED ORDER — POTASSIUM CHLORIDE ER 10 MEQ PO TBCR
10.0000 meq | EXTENDED_RELEASE_TABLET | Freq: Every day | ORAL | Status: DC
Start: 2014-09-15 — End: 2014-09-28

## 2014-09-15 NOTE — Patient Instructions (Signed)
Medication Instructions:   START FUROSEMIDE 40MG  DAILY  START POTASSIUM 10MEQ DAILY  Labwork:  HAVE LAB WORK DONE AT [facility] IN 2 WEEKS  Testing/Procedures:  NONE  Follow-Up:  ONE MONTH WITH AN EXTENDER  1ST AVAILABLE WITH DR. Sallyanne Kuster  Any Other Special Instructions Will Be Listed Below (If Applicable).

## 2014-09-17 NOTE — Progress Notes (Signed)
Patient ID: Marissa Jefferson, female   DOB: [birth year], 81 y.o.   MRN: [id number]     Cardiology Office Note   Date:  09/17/2014   ID:  Marissa Jefferson, DOB 01-14-1925, MRN [id number]  PCP:  Marissa Sheller, MD  Cardiologist:   Sanda Klein, MD   Chief Complaint  Patient presents with  . 3 month follow up    Patient has felt light headed, dizzy, SOB, and has had swelling.      History of Present Illness: Marissa Jefferson is a 81 y.o. female who presents for  Complaints of roughly 10 pound weight gain and worsening dyspnea. She has developed mild ankle edema but the major complaint is shortness of breath with exertion. She feels occasionally dizzy but has not had syncope or palpitations. She denies angina pectoris. There is not a clear pattern of orthopnea or PND.  Almost an entire year has passed since she underwent multivessel bypass surgery in the setting of severe ischemic cardiomyopathy with an ejection fraction of 20-25 %, followed by remarkable improvement in LVEF to 55%.  She had postoperative paroxysmal atrial fibrillation but is no longer taking amiodarone or anticoagulation. She has significant hypertension and takes a statin for hyperlipidemia. She is currently not taking diuretics. She reports compliance with sodium restriction.   Past Medical History  Diagnosis Date  . DM2 (diabetes mellitus, type 2)   . Hypertension   . Hyperlipidemia   . GERD (gastroesophageal reflux disease)   . Osteopenia   . CAD (coronary artery disease), native coronary artery 10/06/2013    LAD 90%, CFX 90%, RCA 100% w/ collat  . Ischemic dilated cardiomyopathy 09/2013    EF 20-25% by echo  . Chronic combined systolic and diastolic CHF, NYHA class 2 09/2013  . NSVT (nonsustained ventricular tachycardia) 09/2013  . OAB (overactive bladder)   . Hypothyroidism   . CVA (cerebral vascular accident) 1968    leaving no deficit  . Arthritis     "knees" (10/08/2013)  . Breast cancer     "left; I had 62 sessions of  radiation"  . 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 Colonoscopy  2005  . Cardiac catheterization  10/06/2013  . Cataract extraction w/ intraocular lens  implant, bilateral Bilateral ~ 2009  . Tonsillectomy and adenoidectomy  1930's  . Breast lumpectomy Left 1998  . Breast lumpectomy with axillary lymph node dissection Left 1998  . Coronary artery bypass graft [name] 10/09/2013    Procedure: CORONARY ARTERY BYPASS GRAFTING (CABG) x 4 using left internal mammary artery and right saphenous leg vein using endoscope.;  Surgeon: Melrose Nakayama, MD;  Location: [facility];  Service: Open Heart Surgery;  Laterality: [name];  . Intraoperative transesophageal echocardiogram [name] 10/09/2013    Procedure: INTRAOPERATIVE TRANSESOPHAGEAL ECHOCARDIOGRAM;  Surgeon: Melrose Nakayama, MD;  Location: [facility];  Service: Open Heart Surgery;  Laterality: [name];  . Left and right heart catheterization with coronary angiogram [name] 10/06/2013    Procedure: LEFT AND RIGHT HEART CATHETERIZATION WITH CORONARY ANGIOGRAM;  Surgeon: Sanda Klein, MD;  Location: [facility];  Service: Cardiovascular;  Laterality: [name];     Current Outpatient Prescriptions  Medication Sig Dispense Refill  . acetaminophen (TYLENOL) 500 MG chewable tablet Chew 1,000 mg by mouth 2 (two) times daily.    Marland Kitchen aspirin EC 325 MG EC tablet Take 1 tablet (325 mg total) by mouth daily. 30 tablet 0  . atorvastatin (LIPITOR)  80 MG tablet Take 1 tablet by mouth every evening.     . Biotin 2500 MCG CAPS Take 1 capsule by mouth daily.    . carvedilol (COREG) 12.5 MG tablet Take 1 tablet (12.5 mg total) by mouth 2 (two) times daily. 180 tablet 3  . cetaphil (CETAPHIL) lotion Apply topically 2 (two) times daily. 236 mL 0  . cholecalciferol (VITAMIN D) 1000 UNITS tablet Take 1,000 Units by mouth daily.    Mariane Baumgarten Calcium (STOOL SOFTENER PO) Take 1 tablet by mouth  daily as needed.    . feeding supplement, GLUCERNA SHAKE, (GLUCERNA SHAKE) LIQD Take 237 mLs by mouth 2 (two) times daily between meals.  0  . ferrous sulfate 325 (65 FE) MG tablet Take 1 tablet (325 mg total) by mouth daily with breakfast.  3  . levothyroxine (SYNTHROID, LEVOTHROID) 137 MCG tablet Take 1 tablet (137 mcg total) by mouth daily before breakfast.    . lisinopril (PRINIVIL,ZESTRIL) 10 MG tablet TAKE 1 TABLET BY MOUTH EVERY DAY 30 tablet 8  . Magnesium Oxide 400 (240 MG) MG TABS TAKE 1 TABLET TWICE DAILY. 60 tablet 9  . metFORMIN (GLUCOPHAGE) 1000 MG tablet Take 1 tablet by mouth 2 (two) times daily.    . Multiple Vitamin (MULTIVITAMIN WITH MINERALS) TABS tablet Take 1 tablet by mouth daily.    . ondansetron (ZOFRAN) 4 MG tablet Take 1 tablet by mouth as needed.    Marland Kitchen oxybutynin (DITROPAN XL) 15 MG 24 hr tablet Take 2 tablets by mouth at bedtime.     . pantoprazole (PROTONIX) 40 MG tablet Take 1 tablet by mouth every evening.     Marland Kitchen VOLTAREN 1 % GEL Apply 1 application topically daily as needed (knee pain).     . furosemide (LASIX) 40 MG tablet Take 1 tablet (40 mg total) by mouth daily. 90 tablet 3  . potassium chloride (K-DUR) 10 MEQ tablet Take 1 tablet (10 mEq total) by mouth daily. 90 tablet 3   No current facility-administered medications for this visit.    Allergies:   Review of patient's allergies indicates no known allergies.    Social History:  The patient  reports that she has quit smoking. Her smoking use included Cigarettes. She has never used smokeless tobacco. She reports that she does not drink alcohol or use illicit drugs.   ROS:  Please see the history of present illness.    Otherwise, review of systems positive for none.   All other systems are reviewed and negative.    PHYSICAL EXAM: VS:  BP 142/94 mmHg  Pulse 76  Ht 5' (1.524 m)  Wt 155 lb (70.308 kg)  BMI 30.27 kg/m2 , BMI Body mass index is 30.27 kg/(m^2).  General: Alert, oriented x3, no  distress Head: no evidence of trauma, PERRL, EOMI, no exophtalmos or lid lag, no myxedema, no xanthelasma; normal ears, nose and oropharynx Neck:  5-6 centimeters elevation in jugular venous pulsations and no hepatojugular reflux; brisk carotid pulses without delay and no carotid bruits Chest: clear to auscultation, no signs of consolidation by percussion or palpation, normal fremitus, symmetrical and full respiratory excursions Cardiovascular: normal position and quality of the apical impulse, regular rhythm, normal first and second heart sounds, no  murmurs, rubs or gallops Abdomen: no tenderness or distention, no masses by palpation, no abnormal pulsatility or arterial bruits, normal bowel sounds, no hepatosplenomegaly Extremities: no clubbing, cyanosis; 1-2+ pedal edema; 2+ radial, ulnar and brachial pulses bilaterally; 2+ right femoral, posterior tibial  and dorsalis pedis pulses; 2+ left femoral, posterior tibial and dorsalis pedis pulses; no subclavian or femoral bruits Neurological: grossly nonfocal Psych: euthymic mood, full affect   Recent Labs: 10/14/2013: Pro B Natriuretic peptide (BNP) 2044.0* 03/29/2014: Magnesium 1.4* 05/10/2014: ALT 12; BUN 26*; Creat 0.65; Hemoglobin 11.1*; Platelets 353; Potassium 4.0; Sodium 139; TSH 4.698*    Lipid Panel    Component Value Date/Time   CHOL 135 05/10/2014 1440   TRIG 138 05/10/2014 1440   HDL 47 05/10/2014 1440   CHOLHDL 2.9 05/10/2014 1440   VLDL 28 05/10/2014 1440   LDLCALC 60 05/10/2014 1440      Wt Readings from Last 3 Encounters:  09/15/14 155 lb (70.308 kg)  05/10/14 135 lb 8 oz (61.462 kg)  03/30/14 144 lb 13.5 oz (65.7 kg)     ASSESSMENT AND PLAN:    Salah has clinical findings and symptoms consistent with heart failure exacerbation. She has not required loop diuretic therapy in months. We'll start a low dose of furosemide as well as a potassium supplement.  Her blood pressure is elevated but I think this is also improved  with diuretic therapy. He is taking carvedilol and lisinopril. Reminded her about the importance of sodium restriction and encouraged that she restart daily weight monitoring and bringing a log of her weights to her next office appointment. She does not appear to be severely volume overloaded and I suspect she responded promptly to diuretic therapy. If this does not occur she should have a repeat echocardiogram.   check labs in 2 weeks. Office evaluation in one month.   Current medicines are reviewed at length with the patient today.  The patient does not have concerns regarding medicines.  Labs/ tests ordered today include:  Orders Placed This Encounter  Procedures  . Basic metabolic panel  . Brain natriuretic peptide   Patient Instructions  Medication Instructions:   START FUROSEMIDE 40MG  DAILY  START POTASSIUM 10MEQ DAILY  Labwork:  HAVE LAB WORK DONE AT [facility] IN 2 WEEKS  Testing/Procedures:  NONE  Follow-Up:  ONE MONTH WITH AN EXTENDER  1ST AVAILABLE WITH DR. Sallyanne Kuster  Any Other Special Instructions Will Be Listed Below (If Applicable).       Mikael Spray, MD  09/17/2014 9:22 PM    Sanda Klein, MD, [facility] [phone] office [phone] pager

## 2014-09-21 ENCOUNTER — Telehealth: Payer: Self-pay | Admitting: Cardiovascular Disease

## 2014-09-21 NOTE — Telephone Encounter (Signed)
Left message for patient to call to schedule follow up appts - 1 month with extender and first available with Dr. Loletha Grayer.

## 2014-09-28 ENCOUNTER — Telehealth: Payer: Self-pay | Admitting: Cardiovascular Disease

## 2014-09-28 MED ORDER — POTASSIUM CHLORIDE ER 10 MEQ PO TBCR: 5.0000 meq | EXTENDED_RELEASE_TABLET | Freq: Every day | ORAL | Status: DC

## 2014-09-28 MED ORDER — FUROSEMIDE 40 MG PO TABS: 20.0000 mg | ORAL_TABLET | Freq: Every day | ORAL | Status: DC

## 2014-09-28 NOTE — Telephone Encounter (Signed)
Please call,question about her Potassium medicine and her fluid medicine.

## 2014-09-28 NOTE — Telephone Encounter (Signed)
Spoke with pt and son, when she saw dr c recently she was placed on furosemide 40 mg and potassium. The pt has lost 20 lbs and does not feel she needs to take this dosage anymore. Okay given for pt to decrease furosemide to 20 mg daily and take 1/2 the potassium dosage. She has had blood work drawn at PCP office and the family is getting those results and will forward those to [place]. They also questioned the thyroid medication dosage, explained she will need a tsh if not done with PCP. Will await the results of lab work.

## 2014-09-29 ENCOUNTER — Encounter: Payer: Self-pay | Admitting: Cardiovascular Disease

## 2014-10-15 ENCOUNTER — Telehealth: Payer: Self-pay | Admitting: *Deleted

## 2014-10-15 NOTE — Telephone Encounter (Signed)
Called Marissa Jefferson and leave message for her to continue her current medication for her thyroid, no change as per Dr Sallyanne Kuster.

## 2014-10-20 ENCOUNTER — Telehealth: Payer: Self-pay | Admitting: Physician Assistant

## 2014-10-21 ENCOUNTER — Encounter: Payer: Self-pay | Admitting: Cardiology

## 2014-10-21 ENCOUNTER — Encounter: Payer: Self-pay | Admitting: Cardiovascular Disease

## 2014-10-21 NOTE — Telephone Encounter (Signed)
Close encounter 

## 2014-10-25 ENCOUNTER — Ambulatory Visit (INDEPENDENT_AMBULATORY_CARE_PROVIDER_SITE_OTHER): Payer: Medicare HMO | Admitting: Physician Assistant

## 2014-10-25 ENCOUNTER — Ambulatory Visit: Payer: Medicare HMO | Admitting: Physician Assistant

## 2014-10-25 ENCOUNTER — Encounter: Payer: Self-pay | Admitting: Physician Assistant

## 2014-10-25 VITALS — BP 140/82 | HR 65 | Ht 66.5 in | Wt 137.1 lb

## 2014-10-25 DIAGNOSIS — I5043 Acute on chronic combined systolic (congestive) and diastolic (congestive) heart failure: Secondary | ICD-10-CM

## 2014-10-25 DIAGNOSIS — I5022 Chronic systolic (congestive) heart failure: Secondary | ICD-10-CM

## 2014-10-25 LAB — BASIC METABOLIC PANEL
BUN: 28 mg/dL — ABNORMAL HIGH (ref 6–23)
CALCIUM: 9.8 mg/dL (ref 8.4–10.5)
CO2: 32 mEq/L (ref 19–32)
CREATININE: 0.69 mg/dL (ref 0.40–1.20)
Chloride: 100 mEq/L (ref 96–112)
GFR: 85.34 mL/min (ref >60.00)
Glucose, Bld: 155 mg/dL — ABNORMAL HIGH (ref 70–99)
Potassium: 3.8 mEq/L (ref 3.5–5.1)
Sodium: 140 mEq/L (ref 135–145)

## 2014-10-25 NOTE — Patient Instructions (Signed)
Medication Instructions:  Your physician recommends that you continue on your current medications as directed. Please refer to the Current Medication list given to you today.   Labwork: Bmet Today  Testing/Procedures: None   Follow-Up: Follow up as planned with Dr.Croitoru in October  Any Other Special Instructions Will Be Listed Below (If Applicable).

## 2014-10-25 NOTE — Assessment & Plan Note (Signed)
Patient has lost 18 pounds since her visit in July. Lasix was already decreased to 20 mg daily.     potassium was also decreased 5 mEq daily.  Patient does state she does not alawys take the Lasix.   particulaly if she is going out.   Overall she looks like she's doing much better. Continue the lower dose of Lasix check a basic metabolic panel today.   Her son he will continue to her use the [employer] inflatable boots.   We talked about low sodium diet daily weight monitoring  And when to take extra Lasix.

## 2014-10-25 NOTE — Progress Notes (Signed)
Patient ID: Marissa Jefferson, female   DOB: 1924-11-21, 81 y.o.   MRN: [id number]    Date:  10/25/2014   ID:  Marissa Jefferson, DOB 02/17/1925, MRN [id number]  PCP:  Thressa Sheller, MD  Primary Cardiologist:  Croitoru    chief complaint:   One Month follow-up for heart failure   History of Present Illness: Marissa Jefferson is a 81 y.o. female who presents forone month evaluation of heart failure.  During her last office visit she was placed on a low dose furosemide with potassium.  She underwent multivessel bypass surgery in July 2015 in the setting of severe ischemic cardiomyopathy with an ejection fraction of 20-25 %, followed by remarkable improvement in LVEF to 55%. She had postoperative paroxysmal atrial fibrillation but is no longer taking amiodarone or anticoagulation. She has significant hypertension and takes a statin for hyperlipidemia. She reports compliance with sodium restriction.     patient's weight has dropped 18 pounds since her previous office visit.   Lasix was decreased to 20 mg daily potassium was decreased 5 MEQ.  She denies orthopnea.  Has lower extremity edema has been prescribed [employer] boots for her her extremities which she uses 15 minutes twice per day.    The patient currently denies nausea, vomiting, fever, chest pain, , dizziness, PND, cough, congestion, abdominal pain, hematochezia, melena, claudication.  Wt Readings from Last 3 Encounters:  10/25/14 137 lb 1.9 oz (62.197 kg)  09/15/14 155 lb (70.308 kg)  05/10/14 135 lb 8 oz (61.462 kg)     Past Medical History  Diagnosis Date  . DM2 (diabetes mellitus, type 2)   . Hypertension   . Hyperlipidemia   . GERD (gastroesophageal reflux disease)   . Osteopenia   . CAD (coronary artery disease), native coronary artery 10/06/2013    LAD 90%, CFX 90%, RCA 100% w/ collat  . Ischemic dilated cardiomyopathy 09/2013    EF 20-25% by echo  . Chronic combined systolic and diastolic CHF, NYHA class 2 09/2013  . NSVT  (nonsustained ventricular tachycardia) 09/2013  . OAB (overactive bladder)   . Hypothyroidism   . CVA (cerebral vascular accident) 1968    leaving no deficit  . Arthritis     "knees" (10/08/2013)  . Breast cancer     "left; I had 62 sessions of radiation"  . 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 Colonoscopy  2005  . Cardiac catheterization  10/06/2013  . Cataract extraction w/ intraocular lens  implant, bilateral Bilateral ~ 2009  . Tonsillectomy and adenoidectomy  1930's  . Breast lumpectomy Left 1998  . Breast lumpectomy with axillary lymph node dissection Left 1998  . Coronary artery bypass graft [name] 10/09/2013    Procedure: CORONARY ARTERY BYPASS GRAFTING (CABG) x 4 using left internal mammary artery and right saphenous leg vein using endoscope.;  Surgeon: Melrose Nakayama, MD;  Location: [facility];  Service: Open Heart Surgery;  Laterality: [name];  . Intraoperative transesophageal echocardiogram [name] 10/09/2013    Procedure: INTRAOPERATIVE TRANSESOPHAGEAL ECHOCARDIOGRAM;  Surgeon: Melrose Nakayama, MD;  Location: [facility];  Service: Open Heart Surgery;  Laterality: [name];  . Left and right heart catheterization with coronary angiogram [name] 10/06/2013    Procedure: LEFT AND RIGHT HEART CATHETERIZATION WITH CORONARY ANGIOGRAM;  Surgeon: Sanda Klein, MD;  Location: [facility];  Service: Cardiovascular;  Laterality: [name];     Current Outpatient Prescriptions  Medication Sig Dispense Refill  .  acetaminophen (TYLENOL) 500 MG chewable tablet Chew 1,000 mg by mouth 2 (two) times daily.    Marland Kitchen aspirin EC 325 MG EC tablet Take 1 tablet (325 mg total) by mouth daily. 30 tablet 0  . atorvastatin (LIPITOR) 80 MG tablet Take 1 tablet by mouth every evening.     . Biotin 2500 MCG CAPS Take 1 capsule by mouth daily.    . carvedilol (COREG) 12.5 MG tablet Take 1 tablet (12.5 mg total) by mouth 2 (two)  times daily. 180 tablet 3  . cetaphil (CETAPHIL) lotion Apply topically 2 (two) times daily. 236 mL 0  . cholecalciferol (VITAMIN D) 1000 UNITS tablet Take 1,000 Units by mouth daily.    Mariane Baumgarten Calcium (STOOL SOFTENER PO) Take 1 tablet by mouth daily as needed (CONSTIPATION).     . feeding supplement, GLUCERNA SHAKE, (GLUCERNA SHAKE) LIQD Take 237 mLs by mouth 2 (two) times daily between meals.  0  . ferrous sulfate 325 (65 FE) MG tablet Take 1 tablet (325 mg total) by mouth daily with breakfast.  3  . furosemide (LASIX) 40 MG tablet Take 0.5 tablets (20 mg total) by mouth daily. 90 tablet 3  . levothyroxine (SYNTHROID, LEVOTHROID) 137 MCG tablet Take 1 tablet (137 mcg total) by mouth daily before breakfast. (Patient taking differently: Take 150 mcg by mouth daily before breakfast. )    . lisinopril (PRINIVIL,ZESTRIL) 10 MG tablet TAKE 1 TABLET BY MOUTH EVERY DAY 30 tablet 8  . Magnesium Oxide 400 (240 MG) MG TABS TAKE 1 TABLET TWICE DAILY. 60 tablet 9  . metFORMIN (GLUCOPHAGE) 1000 MG tablet Take 1 tablet by mouth 2 (two) times daily.    . Multiple Vitamin (MULTIVITAMIN WITH MINERALS) TABS tablet Take 1 tablet by mouth daily.    . ondansetron (ZOFRAN) 4 MG tablet Take 1 tablet by mouth as needed for nausea or vomiting.     Marland Kitchen oxybutynin (DITROPAN XL) 15 MG 24 hr tablet Take 2 tablets by mouth at bedtime.     . pantoprazole (PROTONIX) 40 MG tablet Take 1 tablet by mouth every evening.     . potassium chloride (K-DUR) 10 MEQ tablet Take 0.5 tablets (5 mEq total) by mouth daily. 90 tablet 3  . VOLTAREN 1 % GEL Apply 1 application topically daily as needed (knee pain).      No current facility-administered medications for this visit.    Allergies:   No Known Allergies  Social History:  The patient  reports that she has quit smoking. Her smoking use included Cigarettes. She has never used smokeless tobacco. She reports that she does not drink alcohol or use illicit drugs.   Family history:     Family History  Problem Relation Age of Onset  . Cancer Mother   . Cancer Father   . Heart attack Father     ROS:  Please see the history of present illness.  All other systems reviewed and negative.   PHYSICAL EXAM: VS:  BP 140/82 mmHg  Pulse 65  Ht 5' 6.5" (1.689 m)  Wt 137 lb 1.9 oz (62.197 kg)  BMI 21.80 kg/m2  SpO2 92% Well nourished, well developed, in no acute distress HEENT: Pupils are equal round react to light accommodation extraocular movements are intact.  Neck: no JVDNo cervical lymphadenopathy. Cardiac: Regular rate and rhythm without murmurs rubs or gallops. Lungs:  clear to auscultation bilaterally, no wheezing, rhonchi or rales Abd: soft, nontender, positive bowel sounds all quadrants, no hepatosplenomegaly Ext:  2+  tense lower extremity edema.  2+ radial and 1+ dorsalis pedis pulses. Skin: warm and dry Neuro:  Grossly normal     ASSESSMENT AND PLAN:  Problem List Items Addressed This Visit    Chronic systolic heart failure - Primary   Relevant Orders   Basic metabolic panel   Acute on chronic combined systolic and diastolic CHF, NYHA class 3     Patient has lost 18 pounds since her visit in July. Lasix was already decreased to 20 mg daily.     potassium was also decreased 5 mEq daily.  Patient does state she does not alawys take the Lasix.   particulaly if she is going out.   Overall she looks like she's doing much better. Continue the lower dose of Lasix check a basic metabolic panel today.   Her son he will continue to her use the [employer] system inflatable boots.   We talked about low sodium diet daily weight monitoring  And when to take extra Lasix.

## 2014-11-04 ENCOUNTER — Other Ambulatory Visit: Payer: Self-pay | Admitting: Adult Health

## 2014-12-27 DIAGNOSIS — I255 Ischemic cardiomyopathy: Secondary | ICD-10-CM | POA: Diagnosis not present

## 2014-12-27 DIAGNOSIS — R571 Hypovolemic shock: Secondary | ICD-10-CM | POA: Diagnosis not present

## 2014-12-27 DIAGNOSIS — M25561 Pain in right knee: Secondary | ICD-10-CM | POA: Diagnosis not present

## 2014-12-27 DIAGNOSIS — G9341 Metabolic encephalopathy: Secondary | ICD-10-CM | POA: Diagnosis not present

## 2015-01-03 ENCOUNTER — Ambulatory Visit (INDEPENDENT_AMBULATORY_CARE_PROVIDER_SITE_OTHER): Payer: Medicare HMO | Admitting: Cardiovascular Disease

## 2015-01-03 ENCOUNTER — Encounter: Payer: Self-pay | Admitting: Cardiovascular Disease

## 2015-01-03 VITALS — BP 172/84 | HR 71 | Ht 66.0 in | Wt 121.0 lb

## 2015-01-03 DIAGNOSIS — I255 Ischemic cardiomyopathy: Secondary | ICD-10-CM

## 2015-01-03 DIAGNOSIS — I472 Ventricular tachycardia: Secondary | ICD-10-CM

## 2015-01-03 DIAGNOSIS — I10 Essential (primary) hypertension: Secondary | ICD-10-CM

## 2015-01-03 DIAGNOSIS — I5032 Chronic diastolic (congestive) heart failure: Secondary | ICD-10-CM | POA: Insufficient documentation

## 2015-01-03 DIAGNOSIS — I480 Paroxysmal atrial fibrillation: Secondary | ICD-10-CM

## 2015-01-03 DIAGNOSIS — R001 Bradycardia, unspecified: Secondary | ICD-10-CM | POA: Diagnosis not present

## 2015-01-03 DIAGNOSIS — I420 Dilated cardiomyopathy: Secondary | ICD-10-CM

## 2015-01-03 DIAGNOSIS — I4729 Other ventricular tachycardia: Secondary | ICD-10-CM

## 2015-01-03 DIAGNOSIS — Z951 Presence of aortocoronary bypass graft: Secondary | ICD-10-CM

## 2015-01-03 DIAGNOSIS — I25709 Atherosclerosis of coronary artery bypass graft(s), unspecified, with unspecified angina pectoris: Secondary | ICD-10-CM

## 2015-01-03 DIAGNOSIS — I5042 Chronic combined systolic (congestive) and diastolic (congestive) heart failure: Secondary | ICD-10-CM

## 2015-01-03 DIAGNOSIS — I25118 Atherosclerotic heart disease of native coronary artery with other forms of angina pectoris: Secondary | ICD-10-CM

## 2015-01-03 NOTE — Progress Notes (Signed)
Patient ID: Marissa Jefferson, female   DOB: 02/04/1925, 81 y.o.   MRN: [id number]     Cardiology Office Note   Date:  01/03/2015   ID:  Marissa Jefferson, DOB 1924/09/22, MRN [id number]  PCP:  Thressa Sheller, MD  Cardiologist:   Sanda Klein, MD   Chief Complaint  Patient presents with  . OFFICE VISIT  . Shortness of Breath  . Edema    ANKLES      History of Present Illness: [name] is a 81 y.o. female who presents for follow-up of coronary artery disease and chronic combined heart failure currently predominantly diastolic dysfunction). Roughly one year has passed since her bypass procedure and she seems to be slowly but continuously improving. Just recently she has moved back to living independently in her apartment instead of living with her daughter. She still has "good days and bad days". She denies dyspnea and angina pectoris but has persistent lower extremity edema. She is very reluctant to take the furosemide and often skips doses. A couple of days ago she she had "heartburn" that occurred after eating too fast and was indeed relieved with antacids. She continues to be very slender with a BMI that is under 20. She reports good appetite and drinks one boost a day. Her hemoglobin is now completely back to normal, but she is still taking an iron supplement. She has not had palpitations since the immediate postoperative period when she had paroxysmal atrial fibrillation. She is not taking antiarrhythmics or anticoagulation. She does not have angina pectoris. She is carefully following sodium restriction, although she does enjoy soups from [employer] which the dietitian has told her are relatively low salt. She also eats Marissa Jefferson. Today she weighs 34 pounds less than she did when she was in heart failure in July. She has clearly lost a lot of true weight and it is hard to know what her "dry weight" is.   Past Medical History  Diagnosis Date  . DM2 (diabetes mellitus, type 2) ([place])     . Hypertension   . Hyperlipidemia   . GERD (gastroesophageal reflux disease)   . Osteopenia   . CAD (coronary artery disease), native coronary artery 10/06/2013    LAD 90%, CFX 90%, RCA 100% w/ collat  . Ischemic dilated cardiomyopathy 09/2013    EF 20-25% by echo  . Chronic combined systolic and diastolic CHF, NYHA class 2 ([place]) 09/2013  . NSVT (nonsustained ventricular tachycardia) ([place]) 09/2013  . OAB (overactive bladder)   . Hypothyroidism   . CVA (cerebral vascular accident) ([place]) 1968    leaving no deficit  . Arthritis     "knees" (10/08/2013)  . Breast cancer ([place])     "left; I had 62 sessions of radiation"  . 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 Colonoscopy  2005  . Cardiac catheterization  10/06/2013  . Cataract extraction w/ intraocular lens  implant, bilateral Bilateral ~ 2009  . Tonsillectomy and adenoidectomy  1930's  . Breast lumpectomy Left 1998  . Breast lumpectomy with axillary lymph node dissection Left 1998  . Coronary artery bypass graft [name] 10/09/2013    Procedure: CORONARY ARTERY BYPASS GRAFTING (CABG) x 4 using left internal mammary artery and right saphenous leg vein using endoscope.;  Surgeon: Melrose Nakayama, MD;  Location: [place];  Service: Open Heart Surgery;  Laterality: [name];  . Intraoperative transesophageal echocardiogram [name] 10/09/2013    Procedure: INTRAOPERATIVE  TRANSESOPHAGEAL ECHOCARDIOGRAM;  Surgeon: Melrose Nakayama, MD;  Location: [place];  Service: Open Heart Surgery;  Laterality: [name];  . Left and right heart catheterization with coronary angiogram [name] 10/06/2013    Procedure: LEFT AND RIGHT HEART CATHETERIZATION WITH CORONARY ANGIOGRAM;  Surgeon: Sanda Klein, MD;  Location: [place] CATH LAB;  Service: Cardiovascular;  Laterality: [name];     Current Outpatient Prescriptions  Medication Sig Dispense Refill  . acetaminophen (TYLENOL) 500 MG  chewable tablet Chew 1,000 mg by mouth 2 (two) times daily.    Marland Kitchen aspirin EC 325 MG EC tablet Take 1 tablet (325 mg total) by mouth daily. 30 tablet 0  . atorvastatin (LIPITOR) 80 MG tablet Take 1 tablet by mouth every evening.     . Biotin 2500 MCG CAPS Take 1 capsule by mouth daily.    . carvedilol (COREG) 12.5 MG tablet Take 1 tablet (12.5 mg total) by mouth 2 (two) times daily. 180 tablet 3  . cetaphil (CETAPHIL) lotion Apply topically 2 (two) times daily. 236 mL 0  . cholecalciferol (VITAMIN D) 1000 UNITS tablet Take 1,000 Units by mouth daily.    Mariane Baumgarten Calcium (STOOL SOFTENER PO) Take 1 tablet by mouth daily as needed (CONSTIPATION).     . feeding supplement, GLUCERNA SHAKE, (GLUCERNA SHAKE) LIQD Take 237 mLs by mouth 2 (two) times daily between meals.  0  . furosemide (LASIX) 40 MG tablet Take 0.5 tablets (20 mg total) by mouth daily. 90 tablet 3  . levothyroxine (SYNTHROID, LEVOTHROID) 150 MCG tablet Take 150 mcg by mouth daily.    Marland Kitchen lisinopril (PRINIVIL,ZESTRIL) 10 MG tablet TAKE 1 TABLET BY MOUTH EVERY DAY 30 tablet 8  . Magnesium Oxide 400 (240 MG) MG TABS TAKE 1 TABLET TWICE DAILY. 60 tablet 9  . metFORMIN (GLUCOPHAGE) 1000 MG tablet Take 1 tablet by mouth 2 (two) times daily.    . Multiple Vitamin (MULTIVITAMIN WITH MINERALS) TABS tablet Take 1 tablet by mouth daily.    . ondansetron (ZOFRAN) 4 MG tablet Take 1 tablet by mouth as needed for nausea or vomiting.     Marland Kitchen oxybutynin (DITROPAN XL) 15 MG 24 hr tablet Take 2 tablets by mouth at bedtime.     . potassium chloride (K-DUR) 10 MEQ tablet Take 0.5 tablets (5 mEq total) by mouth daily. 90 tablet 3  . VOLTAREN 1 % GEL Apply 1 application topically daily as needed (knee pain).      No current facility-administered medications for this visit.    Allergies:   Review of patient's allergies indicates no known allergies.    Social History:  The patient  reports that she has quit smoking. Her smoking use included Cigarettes. She  has never used smokeless tobacco. She reports that she does not drink alcohol or use illicit drugs.   Family History:  The patient's family history includes Cancer in her father and mother; Heart attack in her father.    ROS:  Please see the history of present illness.    Otherwise, review of systems positive for none.   All other systems are reviewed and negative.    PHYSICAL EXAM: VS:  BP 172/84 mmHg  Pulse 71  Ht 5\' 6"  (1.676 m)  Wt 121 lb (54.885 kg)  BMI 19.54 kg/m2 , BMI Body mass index is 19.54 kg/(m^2). Recheck blood pressure 142/81 mmHg General: Alert, oriented x3, no distress Head: no evidence of trauma, PERRL, EOMI, no exophtalmos or lid lag, no myxedema, no xanthelasma; normal ears, nose  and oropharynx Neck: normal jugular venous pulsations and no hepatojugular reflux; brisk carotid pulses without delay and no carotid bruits Chest: clear to auscultation, no signs of consolidation by percussion or palpation, normal fremitus, symmetrical and full respiratory excursions Cardiovascular: normal position and quality of the apical impulse, regular rhythm, normal first and second heart sounds, no murmurs, rubs or gallops Abdomen: no tenderness or distention, no masses by palpation, no abnormal pulsatility or arterial bruits, normal bowel sounds, no hepatosplenomegaly Extremities: no clubbing, cyanosis; 1-2+the metrical pretibial edema; 2+ radial, ulnar and brachial pulses bilaterally; 2+ right femoral, posterior tibial and dorsalis pedis pulses; 2+ left femoral, posterior tibial and dorsalis pedis pulses; no subclavian or femoral bruits Neurological: grossly nonfocal Psych: euthymic mood, full affect   EKG:  EKG is ordered today. The ekg ordered today demonstrates a very irregular baseline that limits interpretation. The rhythm is clearly normal sinus and I don't think there are any ST segment changes.   Recent Labs: Labs performed July 6 showed creatinine 1.0, hemoglobin 13 with  a normal erythrocyte indices,normal liver function tests, ferritin 41, hemoglobin A1c 6.2%, cholesterol 117, triglycerides 104, HDL 44, LDL 52, TSH 4.82 03/29/2014: Magnesium 1.4* 05/10/2014: ALT 12; Hemoglobin 11.1*; Platelets 353; TSH 4.698* 10/25/2014: BUN 28*; Creatinine, Ser 0.69; Potassium 3.8; Sodium 140    Lipid Panel    Component Value Date/Time   CHOL 135 05/10/2014 1440   TRIG 138 05/10/2014 1440   HDL 47 05/10/2014 1440   CHOLHDL 2.9 05/10/2014 1440   VLDL 28 05/10/2014 1440   LDLCALC 60 05/10/2014 1440      Wt Readings from Last 3 Encounters:  01/03/15 121 lb (54.885 kg)  10/25/14 137 lb 1.9 oz (62.197 kg)  09/15/14 155 lb (70.308 kg)   ASSESSMENT AND PLAN:  1. Chronic predominately diastolic heart failure. She does not have any signs or symptoms of left heart failure, but does have lower extremity edema. She does not like taking the diuretic. We discussed how she can limit the need for diuretics by further restricting sodium in her diet such as avoiding salty soups and daily meets. It is very difficult to know what her current optimal weight is when she is free of fluid since she has clearly lost a lot of true body mass.  2. CAD s/p CABG July 2015. Following bypass surgery she had a remarkable normalization of left ventricle systolic function, which previously had been as low as an EF of 25%. Continue carvedilol, aspirin, statin, ACE inhibitor. She does not have angina pectoris.  4. Diabetes mellitus type 2, controlled  5. Essential hypertension with fair control  6. Polypharmacy. Will try to limit to essential medications. She no longer requires iron supplements. Heartburn is extremely infrequent so suggested that she also stopped the pantoprazole. This may also allow her to discontinue treatment with magnesium.   Current medicines are reviewed at length with the patient today.  The patient does not have concerns regarding medicines.  The following changes have been  made:  stop ferrous sulfate and pantoprazole  Labs/ tests ordered today include:  Orders Placed This Encounter  Procedures  . EKG 12-Lead    Patient Instructions  Your physician has recommended you make the following change in your medication: STOP THE IRON AND PANTOPRAZOLE  Dr. Sallyanne Kuster recommends that you schedule a follow-up appointment in: [place], Ebunoluwa Gernert, MD  01/03/2015 1:10 PM    Sanda Klein, MD, [facility] [phone] office [phone]  pager

## 2015-01-03 NOTE — Patient Instructions (Signed)
Your physician has recommended you make the following change in your medication: STOP THE IRON AND PANTOPRAZOLE  Dr. Sallyanne Kuster recommends that you schedule a follow-up appointment in: 6 MONTHS

## 2015-01-05 DIAGNOSIS — I130 Hypertensive heart and chronic kidney disease with heart failure and stage 1 through stage 4 chronic kidney disease, or unspecified chronic kidney disease: Secondary | ICD-10-CM | POA: Diagnosis not present

## 2015-01-05 DIAGNOSIS — I2510 Atherosclerotic heart disease of native coronary artery without angina pectoris: Secondary | ICD-10-CM | POA: Diagnosis not present

## 2015-01-05 DIAGNOSIS — M170 Bilateral primary osteoarthritis of knee: Secondary | ICD-10-CM | POA: Diagnosis not present

## 2015-01-05 DIAGNOSIS — I69354 Hemiplegia and hemiparesis following cerebral infarction affecting left non-dominant side: Secondary | ICD-10-CM | POA: Diagnosis not present

## 2015-01-05 DIAGNOSIS — E1122 Type 2 diabetes mellitus with diabetic chronic kidney disease: Secondary | ICD-10-CM | POA: Diagnosis not present

## 2015-01-07 DIAGNOSIS — E1122 Type 2 diabetes mellitus with diabetic chronic kidney disease: Secondary | ICD-10-CM | POA: Diagnosis not present

## 2015-01-07 DIAGNOSIS — M170 Bilateral primary osteoarthritis of knee: Secondary | ICD-10-CM | POA: Diagnosis not present

## 2015-01-07 DIAGNOSIS — I130 Hypertensive heart and chronic kidney disease with heart failure and stage 1 through stage 4 chronic kidney disease, or unspecified chronic kidney disease: Secondary | ICD-10-CM | POA: Diagnosis not present

## 2015-01-07 DIAGNOSIS — I69354 Hemiplegia and hemiparesis following cerebral infarction affecting left non-dominant side: Secondary | ICD-10-CM | POA: Diagnosis not present

## 2015-01-07 DIAGNOSIS — I2510 Atherosclerotic heart disease of native coronary artery without angina pectoris: Secondary | ICD-10-CM | POA: Diagnosis not present

## 2015-01-10 DIAGNOSIS — R634 Abnormal weight loss: Secondary | ICD-10-CM | POA: Diagnosis not present

## 2015-01-10 DIAGNOSIS — R509 Fever, unspecified: Secondary | ICD-10-CM | POA: Diagnosis not present

## 2015-01-10 DIAGNOSIS — R269 Unspecified abnormalities of gait and mobility: Secondary | ICD-10-CM | POA: Diagnosis not present

## 2015-01-10 DIAGNOSIS — R0602 Shortness of breath: Secondary | ICD-10-CM | POA: Diagnosis not present

## 2015-01-11 DIAGNOSIS — I130 Hypertensive heart and chronic kidney disease with heart failure and stage 1 through stage 4 chronic kidney disease, or unspecified chronic kidney disease: Secondary | ICD-10-CM | POA: Diagnosis not present

## 2015-01-11 DIAGNOSIS — M170 Bilateral primary osteoarthritis of knee: Secondary | ICD-10-CM | POA: Diagnosis not present

## 2015-01-11 DIAGNOSIS — E1122 Type 2 diabetes mellitus with diabetic chronic kidney disease: Secondary | ICD-10-CM | POA: Diagnosis not present

## 2015-01-11 DIAGNOSIS — I69354 Hemiplegia and hemiparesis following cerebral infarction affecting left non-dominant side: Secondary | ICD-10-CM | POA: Diagnosis not present

## 2015-01-11 DIAGNOSIS — I2510 Atherosclerotic heart disease of native coronary artery without angina pectoris: Secondary | ICD-10-CM | POA: Diagnosis not present

## 2015-01-14 DIAGNOSIS — I130 Hypertensive heart and chronic kidney disease with heart failure and stage 1 through stage 4 chronic kidney disease, or unspecified chronic kidney disease: Secondary | ICD-10-CM | POA: Diagnosis not present

## 2015-01-14 DIAGNOSIS — I69354 Hemiplegia and hemiparesis following cerebral infarction affecting left non-dominant side: Secondary | ICD-10-CM | POA: Diagnosis not present

## 2015-01-14 DIAGNOSIS — E1122 Type 2 diabetes mellitus with diabetic chronic kidney disease: Secondary | ICD-10-CM | POA: Diagnosis not present

## 2015-01-14 DIAGNOSIS — I2510 Atherosclerotic heart disease of native coronary artery without angina pectoris: Secondary | ICD-10-CM | POA: Diagnosis not present

## 2015-01-14 DIAGNOSIS — M170 Bilateral primary osteoarthritis of knee: Secondary | ICD-10-CM | POA: Diagnosis not present

## 2015-01-15 ENCOUNTER — Other Ambulatory Visit: Payer: Self-pay | Admitting: Internal Medicine

## 2015-01-15 DIAGNOSIS — R9389 Abnormal findings on diagnostic imaging of other specified body structures: Secondary | ICD-10-CM

## 2015-01-18 DIAGNOSIS — I130 Hypertensive heart and chronic kidney disease with heart failure and stage 1 through stage 4 chronic kidney disease, or unspecified chronic kidney disease: Secondary | ICD-10-CM | POA: Diagnosis not present

## 2015-01-18 DIAGNOSIS — I69354 Hemiplegia and hemiparesis following cerebral infarction affecting left non-dominant side: Secondary | ICD-10-CM | POA: Diagnosis not present

## 2015-01-18 DIAGNOSIS — E1122 Type 2 diabetes mellitus with diabetic chronic kidney disease: Secondary | ICD-10-CM | POA: Diagnosis not present

## 2015-01-18 DIAGNOSIS — I2510 Atherosclerotic heart disease of native coronary artery without angina pectoris: Secondary | ICD-10-CM | POA: Diagnosis not present

## 2015-01-18 DIAGNOSIS — M170 Bilateral primary osteoarthritis of knee: Secondary | ICD-10-CM | POA: Diagnosis not present

## 2015-01-19 ENCOUNTER — Encounter: Payer: Self-pay | Admitting: Cardiovascular Disease

## 2015-01-19 DIAGNOSIS — I69354 Hemiplegia and hemiparesis following cerebral infarction affecting left non-dominant side: Secondary | ICD-10-CM | POA: Diagnosis not present

## 2015-01-19 DIAGNOSIS — E1122 Type 2 diabetes mellitus with diabetic chronic kidney disease: Secondary | ICD-10-CM | POA: Diagnosis not present

## 2015-01-19 DIAGNOSIS — I2510 Atherosclerotic heart disease of native coronary artery without angina pectoris: Secondary | ICD-10-CM | POA: Diagnosis not present

## 2015-01-19 DIAGNOSIS — M170 Bilateral primary osteoarthritis of knee: Secondary | ICD-10-CM | POA: Diagnosis not present

## 2015-01-19 DIAGNOSIS — I130 Hypertensive heart and chronic kidney disease with heart failure and stage 1 through stage 4 chronic kidney disease, or unspecified chronic kidney disease: Secondary | ICD-10-CM | POA: Diagnosis not present

## 2015-01-19 NOTE — Telephone Encounter (Signed)
error 

## 2015-01-20 ENCOUNTER — Other Ambulatory Visit: Payer: Self-pay | Admitting: Internal Medicine

## 2015-01-20 ENCOUNTER — Ambulatory Visit
Admission: RE | Admit: 2015-01-20 | Discharge: 2015-01-20 | Disposition: A | Discharge status: Home / Self Care | Payer: Medicare HMO | Source: Ambulatory Visit | Attending: Internal Medicine | Admitting: Internal Medicine

## 2015-01-20 ENCOUNTER — Telehealth: Payer: Self-pay | Admitting: Cardiovascular Disease

## 2015-01-20 ENCOUNTER — Inpatient Hospital Stay: Admission: RE | Admit: 2015-01-20 | Payer: Medicare HMO | Source: Ambulatory Visit

## 2015-01-20 DIAGNOSIS — R9389 Abnormal findings on diagnostic imaging of other specified body structures: Secondary | ICD-10-CM

## 2015-01-20 DIAGNOSIS — J90 Pleural effusion, not elsewhere classified: Secondary | ICD-10-CM | POA: Diagnosis not present

## 2015-01-20 MED ORDER — IOPAMIDOL (ISOVUE-300) INJECTION 61%: 75.0000 mL | Freq: Once | INTRAVENOUS | Status: DC | PRN

## 2015-01-20 NOTE — Telephone Encounter (Signed)
Alvis Lemmings nurse calling to request [facility] for home social worker to see pt to help pt set up w/ community resource, transportation, long term health assistance. Needs physician OK.  Routed to Dr. Sallyanne Kuster for OK.

## 2015-01-20 NOTE — Telephone Encounter (Signed)
Physician OK

## 2015-01-21 DIAGNOSIS — I69354 Hemiplegia and hemiparesis following cerebral infarction affecting left non-dominant side: Secondary | ICD-10-CM | POA: Diagnosis not present

## 2015-01-21 DIAGNOSIS — I130 Hypertensive heart and chronic kidney disease with heart failure and stage 1 through stage 4 chronic kidney disease, or unspecified chronic kidney disease: Secondary | ICD-10-CM | POA: Diagnosis not present

## 2015-01-21 DIAGNOSIS — I2510 Atherosclerotic heart disease of native coronary artery without angina pectoris: Secondary | ICD-10-CM | POA: Diagnosis not present

## 2015-01-21 DIAGNOSIS — M170 Bilateral primary osteoarthritis of knee: Secondary | ICD-10-CM | POA: Diagnosis not present

## 2015-01-21 DIAGNOSIS — E1122 Type 2 diabetes mellitus with diabetic chronic kidney disease: Secondary | ICD-10-CM | POA: Diagnosis not present

## 2015-01-21 NOTE — Telephone Encounter (Signed)
Called Heather and notified her of MD info. She will send over paperwork for MD to sign off on.

## 2015-01-25 DIAGNOSIS — I2510 Atherosclerotic heart disease of native coronary artery without angina pectoris: Secondary | ICD-10-CM | POA: Diagnosis not present

## 2015-01-25 DIAGNOSIS — I130 Hypertensive heart and chronic kidney disease with heart failure and stage 1 through stage 4 chronic kidney disease, or unspecified chronic kidney disease: Secondary | ICD-10-CM | POA: Diagnosis not present

## 2015-01-25 DIAGNOSIS — M170 Bilateral primary osteoarthritis of knee: Secondary | ICD-10-CM | POA: Diagnosis not present

## 2015-01-25 DIAGNOSIS — E1122 Type 2 diabetes mellitus with diabetic chronic kidney disease: Secondary | ICD-10-CM | POA: Diagnosis not present

## 2015-01-25 DIAGNOSIS — I69354 Hemiplegia and hemiparesis following cerebral infarction affecting left non-dominant side: Secondary | ICD-10-CM | POA: Diagnosis not present

## 2015-01-26 DIAGNOSIS — E1122 Type 2 diabetes mellitus with diabetic chronic kidney disease: Secondary | ICD-10-CM | POA: Diagnosis not present

## 2015-01-26 DIAGNOSIS — M170 Bilateral primary osteoarthritis of knee: Secondary | ICD-10-CM | POA: Diagnosis not present

## 2015-01-26 DIAGNOSIS — I130 Hypertensive heart and chronic kidney disease with heart failure and stage 1 through stage 4 chronic kidney disease, or unspecified chronic kidney disease: Secondary | ICD-10-CM | POA: Diagnosis not present

## 2015-01-26 DIAGNOSIS — I2510 Atherosclerotic heart disease of native coronary artery without angina pectoris: Secondary | ICD-10-CM | POA: Diagnosis not present

## 2015-01-26 DIAGNOSIS — I69354 Hemiplegia and hemiparesis following cerebral infarction affecting left non-dominant side: Secondary | ICD-10-CM | POA: Diagnosis not present

## 2015-01-27 ENCOUNTER — Telehealth: Payer: Self-pay | Admitting: Cardiovascular Disease

## 2015-01-27 DIAGNOSIS — M25561 Pain in right knee: Secondary | ICD-10-CM | POA: Diagnosis not present

## 2015-01-27 DIAGNOSIS — I255 Ischemic cardiomyopathy: Secondary | ICD-10-CM | POA: Diagnosis not present

## 2015-01-27 DIAGNOSIS — G9341 Metabolic encephalopathy: Secondary | ICD-10-CM | POA: Diagnosis not present

## 2015-01-27 DIAGNOSIS — R571 Hypovolemic shock: Secondary | ICD-10-CM | POA: Diagnosis not present

## 2015-01-27 NOTE — Telephone Encounter (Signed)
Nira Conn would like a to  extend  Nurse visit x 2  This week and next. Okay for visit , verbal  per Dr Sallyanne Kuster

## 2015-01-28 ENCOUNTER — Telehealth: Payer: Self-pay | Admitting: Cardiovascular Disease

## 2015-01-28 ENCOUNTER — Telehealth: Payer: Self-pay | Admitting: *Deleted

## 2015-01-28 DIAGNOSIS — M170 Bilateral primary osteoarthritis of knee: Secondary | ICD-10-CM | POA: Diagnosis not present

## 2015-01-28 DIAGNOSIS — I2510 Atherosclerotic heart disease of native coronary artery without angina pectoris: Secondary | ICD-10-CM | POA: Diagnosis not present

## 2015-01-28 DIAGNOSIS — I130 Hypertensive heart and chronic kidney disease with heart failure and stage 1 through stage 4 chronic kidney disease, or unspecified chronic kidney disease: Secondary | ICD-10-CM | POA: Diagnosis not present

## 2015-01-28 DIAGNOSIS — E1122 Type 2 diabetes mellitus with diabetic chronic kidney disease: Secondary | ICD-10-CM | POA: Diagnosis not present

## 2015-01-28 DIAGNOSIS — I69354 Hemiplegia and hemiparesis following cerebral infarction affecting left non-dominant side: Secondary | ICD-10-CM | POA: Diagnosis not present

## 2015-01-28 NOTE — Telephone Encounter (Signed)
Signed order faxed for assist with community resources to [facility].

## 2015-01-28 NOTE — Telephone Encounter (Signed)
Returned call to [employer] with [facility] no answer.[place].

## 2015-01-28 NOTE — Telephone Encounter (Signed)
Received a call from [facility] with [facility].She wanted to ask Dr.Croitoru if he will ok a order for OT to help patient with mobility.She also wanted to report patient has crackles in right lower lobe.No sob.B/P 178/90 pulse 63.Message sent to Dr.Croitoru.

## 2015-01-28 NOTE — Telephone Encounter (Signed)
OT OK. Can we please get a few more BP reports? If consistently high may need to change Rx. MCr

## 2015-01-31 DIAGNOSIS — H02834 Dermatochalasis of left upper eyelid: Secondary | ICD-10-CM | POA: Diagnosis not present

## 2015-01-31 DIAGNOSIS — H1851 Endothelial corneal dystrophy: Secondary | ICD-10-CM | POA: Diagnosis not present

## 2015-01-31 DIAGNOSIS — H43813 Vitreous degeneration, bilateral: Secondary | ICD-10-CM | POA: Diagnosis not present

## 2015-01-31 DIAGNOSIS — H40013 Open angle with borderline findings, low risk, bilateral: Secondary | ICD-10-CM | POA: Diagnosis not present

## 2015-01-31 DIAGNOSIS — H02831 Dermatochalasis of right upper eyelid: Secondary | ICD-10-CM | POA: Diagnosis not present

## 2015-01-31 DIAGNOSIS — Z961 Presence of intraocular lens: Secondary | ICD-10-CM | POA: Diagnosis not present

## 2015-01-31 NOTE — Telephone Encounter (Signed)
Returned call to [employer] with [facility] Dr.Croitoru advised ok for OT.He also advised needs a few more B/P checks if continues to be elevated may need to change medication.

## 2015-02-01 DIAGNOSIS — E1122 Type 2 diabetes mellitus with diabetic chronic kidney disease: Secondary | ICD-10-CM | POA: Diagnosis not present

## 2015-02-01 DIAGNOSIS — I2510 Atherosclerotic heart disease of native coronary artery without angina pectoris: Secondary | ICD-10-CM | POA: Diagnosis not present

## 2015-02-01 DIAGNOSIS — M170 Bilateral primary osteoarthritis of knee: Secondary | ICD-10-CM | POA: Diagnosis not present

## 2015-02-01 DIAGNOSIS — I69354 Hemiplegia and hemiparesis following cerebral infarction affecting left non-dominant side: Secondary | ICD-10-CM | POA: Diagnosis not present

## 2015-02-01 DIAGNOSIS — I130 Hypertensive heart and chronic kidney disease with heart failure and stage 1 through stage 4 chronic kidney disease, or unspecified chronic kidney disease: Secondary | ICD-10-CM | POA: Diagnosis not present

## 2015-02-02 ENCOUNTER — Telehealth: Payer: Self-pay | Admitting: *Deleted

## 2015-02-02 DIAGNOSIS — M170 Bilateral primary osteoarthritis of knee: Secondary | ICD-10-CM | POA: Diagnosis not present

## 2015-02-02 DIAGNOSIS — I69354 Hemiplegia and hemiparesis following cerebral infarction affecting left non-dominant side: Secondary | ICD-10-CM | POA: Diagnosis not present

## 2015-02-02 DIAGNOSIS — I2510 Atherosclerotic heart disease of native coronary artery without angina pectoris: Secondary | ICD-10-CM | POA: Diagnosis not present

## 2015-02-02 DIAGNOSIS — E1122 Type 2 diabetes mellitus with diabetic chronic kidney disease: Secondary | ICD-10-CM | POA: Diagnosis not present

## 2015-02-02 DIAGNOSIS — I130 Hypertensive heart and chronic kidney disease with heart failure and stage 1 through stage 4 chronic kidney disease, or unspecified chronic kidney disease: Secondary | ICD-10-CM | POA: Diagnosis not present

## 2015-02-02 NOTE — Telephone Encounter (Signed)
Signed order for nursing visits x 2 weeks faxed 02/01/15.

## 2015-02-07 ENCOUNTER — Telehealth: Payer: Self-pay | Admitting: *Deleted

## 2015-02-07 DIAGNOSIS — I69354 Hemiplegia and hemiparesis following cerebral infarction affecting left non-dominant side: Secondary | ICD-10-CM | POA: Diagnosis not present

## 2015-02-07 DIAGNOSIS — M170 Bilateral primary osteoarthritis of knee: Secondary | ICD-10-CM | POA: Diagnosis not present

## 2015-02-07 DIAGNOSIS — I130 Hypertensive heart and chronic kidney disease with heart failure and stage 1 through stage 4 chronic kidney disease, or unspecified chronic kidney disease: Secondary | ICD-10-CM | POA: Diagnosis not present

## 2015-02-07 DIAGNOSIS — E1122 Type 2 diabetes mellitus with diabetic chronic kidney disease: Secondary | ICD-10-CM | POA: Diagnosis not present

## 2015-02-07 DIAGNOSIS — I2510 Atherosclerotic heart disease of native coronary artery without angina pectoris: Secondary | ICD-10-CM | POA: Diagnosis not present

## 2015-02-07 NOTE — Telephone Encounter (Signed)
Signed order for OT for self care and ADLS shower safety faxed to [facility].

## 2015-02-11 DIAGNOSIS — I69354 Hemiplegia and hemiparesis following cerebral infarction affecting left non-dominant side: Secondary | ICD-10-CM | POA: Diagnosis not present

## 2015-02-11 DIAGNOSIS — M170 Bilateral primary osteoarthritis of knee: Secondary | ICD-10-CM | POA: Diagnosis not present

## 2015-02-11 DIAGNOSIS — I2510 Atherosclerotic heart disease of native coronary artery without angina pectoris: Secondary | ICD-10-CM | POA: Diagnosis not present

## 2015-02-11 DIAGNOSIS — E1122 Type 2 diabetes mellitus with diabetic chronic kidney disease: Secondary | ICD-10-CM | POA: Diagnosis not present

## 2015-02-11 DIAGNOSIS — I130 Hypertensive heart and chronic kidney disease with heart failure and stage 1 through stage 4 chronic kidney disease, or unspecified chronic kidney disease: Secondary | ICD-10-CM | POA: Diagnosis not present

## 2015-02-14 ENCOUNTER — Telehealth: Payer: Self-pay | Admitting: Cardiovascular Disease

## 2015-02-14 NOTE — Telephone Encounter (Signed)
Nira Conn will check weight at visit and obtain list of recent weights as available.

## 2015-02-14 NOTE — Telephone Encounter (Signed)
Heather RN called from [place]. States she is doing pt home visits - noted last week lung crackles, last week was the last visit under previous orders.  She called for verbal OK for additional home visit to f/u on patient - re: lung crackles. No immediate concerns and will notify of any findings. Advised OK.

## 2015-02-14 NOTE — Telephone Encounter (Signed)
Okay for additional visits. Can we find out what her weight is?

## 2015-02-16 DIAGNOSIS — M170 Bilateral primary osteoarthritis of knee: Secondary | ICD-10-CM | POA: Diagnosis not present

## 2015-02-16 DIAGNOSIS — I2510 Atherosclerotic heart disease of native coronary artery without angina pectoris: Secondary | ICD-10-CM | POA: Diagnosis not present

## 2015-02-16 DIAGNOSIS — E1122 Type 2 diabetes mellitus with diabetic chronic kidney disease: Secondary | ICD-10-CM | POA: Diagnosis not present

## 2015-02-16 DIAGNOSIS — I69354 Hemiplegia and hemiparesis following cerebral infarction affecting left non-dominant side: Secondary | ICD-10-CM | POA: Diagnosis not present

## 2015-02-16 DIAGNOSIS — I130 Hypertensive heart and chronic kidney disease with heart failure and stage 1 through stage 4 chronic kidney disease, or unspecified chronic kidney disease: Secondary | ICD-10-CM | POA: Diagnosis not present

## 2015-02-17 DIAGNOSIS — E1122 Type 2 diabetes mellitus with diabetic chronic kidney disease: Secondary | ICD-10-CM | POA: Diagnosis not present

## 2015-02-17 DIAGNOSIS — I2510 Atherosclerotic heart disease of native coronary artery without angina pectoris: Secondary | ICD-10-CM | POA: Diagnosis not present

## 2015-02-17 DIAGNOSIS — I69354 Hemiplegia and hemiparesis following cerebral infarction affecting left non-dominant side: Secondary | ICD-10-CM | POA: Diagnosis not present

## 2015-02-17 DIAGNOSIS — I130 Hypertensive heart and chronic kidney disease with heart failure and stage 1 through stage 4 chronic kidney disease, or unspecified chronic kidney disease: Secondary | ICD-10-CM | POA: Diagnosis not present

## 2015-02-17 DIAGNOSIS — M170 Bilateral primary osteoarthritis of knee: Secondary | ICD-10-CM | POA: Diagnosis not present

## 2015-02-18 ENCOUNTER — Telehealth: Payer: Self-pay | Admitting: *Deleted

## 2015-02-18 NOTE — Telephone Encounter (Signed)
Signed order for OT with [facility] faxed 02/15/2015.

## 2015-02-22 DIAGNOSIS — I130 Hypertensive heart and chronic kidney disease with heart failure and stage 1 through stage 4 chronic kidney disease, or unspecified chronic kidney disease: Secondary | ICD-10-CM | POA: Diagnosis not present

## 2015-02-22 DIAGNOSIS — I2510 Atherosclerotic heart disease of native coronary artery without angina pectoris: Secondary | ICD-10-CM | POA: Diagnosis not present

## 2015-02-22 DIAGNOSIS — M170 Bilateral primary osteoarthritis of knee: Secondary | ICD-10-CM | POA: Diagnosis not present

## 2015-02-22 DIAGNOSIS — I69354 Hemiplegia and hemiparesis following cerebral infarction affecting left non-dominant side: Secondary | ICD-10-CM | POA: Diagnosis not present

## 2015-02-22 DIAGNOSIS — E1122 Type 2 diabetes mellitus with diabetic chronic kidney disease: Secondary | ICD-10-CM | POA: Diagnosis not present

## 2015-02-23 DIAGNOSIS — E1122 Type 2 diabetes mellitus with diabetic chronic kidney disease: Secondary | ICD-10-CM | POA: Diagnosis not present

## 2015-02-23 DIAGNOSIS — I2510 Atherosclerotic heart disease of native coronary artery without angina pectoris: Secondary | ICD-10-CM | POA: Diagnosis not present

## 2015-02-23 DIAGNOSIS — I69354 Hemiplegia and hemiparesis following cerebral infarction affecting left non-dominant side: Secondary | ICD-10-CM | POA: Diagnosis not present

## 2015-02-23 DIAGNOSIS — I130 Hypertensive heart and chronic kidney disease with heart failure and stage 1 through stage 4 chronic kidney disease, or unspecified chronic kidney disease: Secondary | ICD-10-CM | POA: Diagnosis not present

## 2015-02-23 DIAGNOSIS — M170 Bilateral primary osteoarthritis of knee: Secondary | ICD-10-CM | POA: Diagnosis not present

## 2015-02-25 ENCOUNTER — Other Ambulatory Visit (INDEPENDENT_AMBULATORY_CARE_PROVIDER_SITE_OTHER): Payer: Medicare HMO

## 2015-02-25 ENCOUNTER — Encounter: Payer: Self-pay | Admitting: Internal Medicine

## 2015-02-25 ENCOUNTER — Ambulatory Visit (INDEPENDENT_AMBULATORY_CARE_PROVIDER_SITE_OTHER): Payer: Medicare HMO | Admitting: Internal Medicine

## 2015-02-25 ENCOUNTER — Telehealth: Payer: Self-pay | Admitting: *Deleted

## 2015-02-25 VITALS — BP 122/80 | HR 60 | Ht 66.75 in | Wt 129.0 lb

## 2015-02-25 DIAGNOSIS — R918 Other nonspecific abnormal finding of lung field: Secondary | ICD-10-CM

## 2015-02-25 DIAGNOSIS — I10 Essential (primary) hypertension: Secondary | ICD-10-CM | POA: Diagnosis not present

## 2015-02-25 DIAGNOSIS — I5042 Chronic combined systolic (congestive) and diastolic (congestive) heart failure: Secondary | ICD-10-CM

## 2015-02-25 DIAGNOSIS — J90 Pleural effusion, not elsewhere classified: Secondary | ICD-10-CM | POA: Diagnosis not present

## 2015-02-25 LAB — HEPATIC FUNCTION PANEL
ALT: 46 U/L — AB (ref 0–35)
AST: 41 U/L — AB (ref 0–37)
Albumin: 3.7 g/dL (ref 3.5–5.2)
Alkaline Phosphatase: 74 U/L (ref 39–117)
BILIRUBIN DIRECT: 0.2 mg/dL (ref 0.0–0.3)
BILIRUBIN TOTAL: 0.5 mg/dL (ref 0.2–1.2)
TOTAL PROTEIN: 6.5 g/dL (ref 6.0–8.3)

## 2015-02-25 LAB — BASIC METABOLIC PANEL
BUN: 26 mg/dL — AB (ref 6–23)
CALCIUM: 9.6 mg/dL (ref 8.4–10.5)
CO2: 33 mEq/L — ABNORMAL HIGH (ref 19–32)
Chloride: 102 mEq/L (ref 96–112)
Creatinine, Ser: 0.8 mg/dL (ref 0.40–1.20)
GFR: 71.89 mL/min (ref >60.00)
GLUCOSE: 144 mg/dL — AB (ref 70–99)
Potassium: 4.5 mEq/L (ref 3.5–5.1)
Sodium: 140 mEq/L (ref 135–145)

## 2015-02-25 LAB — BRAIN NATRIURETIC PEPTIDE: PRO B NATRI PEPTIDE: 2221 pg/mL — AB (ref 0.0–100.0)

## 2015-02-25 LAB — SEDIMENTATION RATE: Sed Rate: 9 mm/hr (ref 0–22)

## 2015-02-25 LAB — TSH: TSH: 2.44 u[IU]/mL (ref 0.35–4.50)

## 2015-02-25 MED ORDER — SPIRONOLACTONE-HCTZ 25-25 MG PO TABS: 1.0000 | ORAL_TABLET | Freq: Every day | ORAL | Status: DC

## 2015-02-25 NOTE — Progress Notes (Signed)
Subjective:    Patient ID: Marissa Jefferson, female    DOB: 04-30-1924     MRN: [id number]  HPI   38 yowf  Quit smoking 1950 referred to pulmonary clinic 02/25/2015 by Dr Thressa Sheller  For eval of R >> L pleural effusion     02/26/2015 1st [facility] office visit/ Edmund Holcomb   Chief Complaint  Patient presents with  . Pulmonary Consult    Referred by Dr. Noah Delaine for eval of abnormal ct chest.   pt has documented ishemic CM s/p cabg 10/09/13 with persistent leg swelling attributed to chf by cards but has been reluctant to take lasix x last 6 m and instead elevates her legs or wears compression hose and able to sleep comfortably on 2-3 pillows   No obvious  in day to day or daytime variabilty is doe or assoc chronic cough or cp or chest tightness, subjective wheeze overt sinus or hb symptoms. No unusual exp hx or h/o childhood pna/ asthma or knowledge of premature birth.  Sleeping ok without nocturnal  or early am exacerbation  of respiratory  c/o's or need for noct saba. Also denies any obvious fluctuation of symptoms with weather or environmental changes or other aggravating or alleviating factors except as outlined above   Current Medications, Allergies, Complete Past Medical History, Past Surgical History, Family History, and Social History were reviewed in [employer] record.              Review of Systems  Constitutional: Positive for unexpected weight change. Negative for fever and chills.  HENT: Negative for congestion, dental problem, ear pain, nosebleeds, postnasal drip, rhinorrhea, sinus pressure, sneezing, sore throat, trouble swallowing and voice change.   Eyes: Negative for visual disturbance.  Respiratory: Negative for cough, choking and shortness of breath.   Cardiovascular: Negative for chest pain and leg swelling.  Gastrointestinal: Negative for vomiting, abdominal pain and diarrhea.  Genitourinary: Negative for difficulty urinating.    Musculoskeletal: Negative for arthralgias.  Skin: Negative for rash.  Neurological: Negative for tremors, syncope and headaches.  Hematological: Does not bruise/bleed easily.       Objective:   Physical Exam  amb wf nad  Wt Readings from Last 3 Encounters:  02/25/15 129 lb (58.514 kg)  01/03/15 121 lb (54.885 kg)  10/25/14 137 lb 1.9 oz (62.197 kg)    Vital signs reviewed   HEENT: nl dentition, turbinates, and oropharynx. Nl external ear canals without cough reflex   NECK :  without JVD/Nodes/TM/ nl carotid upstrokes bilaterally   LUNGS: no acc muscle use,  slt barrel  chest with decreased bs both bases R >> L without cough on insp or exp maneuvers   CV:  RRR  no s3 or murmur or increase in P2,   Edema pitting R > L   ABD:  soft and nontender with nl inspiratory excursion in the supine position. No bruits or organomegaly, bowel sounds nl  MS:  Nl gait/ ext warm without deformities, calf tenderness, cyanosis or clubbing No obvious joint restrictions   SKIN: warm and dry without lesions    NEURO:  alert, approp, nl sensorium with  no motor deficits     I personally reviewed images and agree with radiology impression as follows:  CT chest 01/20/15 New moderate right pleural effusion and small left pleural effusion. Minimal atelectasis over the lingula, otherwise no significant airspace process. No mediastinal/ hilar adenopathy.  Mild to moderate cardiomegaly with atherosclerotic coronary artery disease. Stable  ectasia of the ascending thoracic aorta measuring 3.5 cm in AP diameter.  Few tiny sub cm bilateral pulmonary nodules unchanged and likely benign. Recommend 1 additional followup chest CT 1 year to document 2 years of stability.   Labs ordered/ reviewed:  Lab 02/25/15 1555  NA 140  K 4.5  CL 102  CO2 33*  BUN 26*  CREATININE 0.80  GLUCOSE 144*  Albumin  02/25/2015  = 3.7   Lab Results  Component Value Date   TSH 2.44 02/25/2015     Lab  Results  Component Value Date   PROBNP 2221.0* 02/25/2015       Lab Results  Component Value Date   ESRSEDRATE 9 02/25/2015         Assessment & Plan:

## 2015-02-25 NOTE — Patient Instructions (Addendum)
Aldactazide 25/25 one daily   As needed for swelling > lasix one daily and no potassium at all   Please remember to go to the lab  department downstairs for your tests - we will call you with the results when they are available.  Please schedule a follow up office visit in 4 weeks, sooner if needed for worse breathing

## 2015-02-25 NOTE — Telephone Encounter (Signed)
Social Work order and SN order signed and faxed to [facility].

## 2015-02-26 DIAGNOSIS — R918 Other nonspecific abnormal finding of lung field: Secondary | ICD-10-CM | POA: Insufficient documentation

## 2015-02-26 NOTE — Assessment & Plan Note (Addendum)
See CT Chest 01/20/15 > in tickle file for repeat s contrast 01/20/16    Although there are clearly abnormalities on CT scan, they should probably be considered "microscopic" since not obvious on plain cxr and are most likely benign and not related at all to the bilateral effusions (which would be easy to tap should they not respond to diuretic rx, whereas the nodules would be impossible to bx at this point unless by vats)    In the setting of obvious "macroscopic" health issues,  I am very reluctatnt to embark on an invasive w/u at this point but will arrange consevative  follow up and in the meantime see what we can do to address the patient's subjective concerns.    Discussed in detail all the  indications, usual  risks and alternatives  relative to the benefits with patient who agrees to proceed with conservative f/u as outlined

## 2015-02-26 NOTE — Assessment & Plan Note (Addendum)
Echo 03/22/13 - Left ventricle: Inferobasal hypokinesis The cavity size was normal. Wall thickness was increased in a pattern of moderate LVH. The estimated ejection fraction was 55%. - Aortic valve: small systolic gradient with no significant stenosis - Mitral valve: There was mild regurgitation. - Right ventricle: The cavity size was mildly dilated. - Atrial septum: No defect or patent foramen ovale was identified. - Pulmonary arteries: PA peak pressure: 46 mm Hg (S). Eval in pulmonary clinic 02/25/2015 rec aldactazide 25/25/ one daily and d/c all K  The ddx for bilateral R > L effusions is so limited that with a nl tsh and alb and creat this is almost certainly related to chf/ esp with bnp > 2200  She has learned to control lower ext swelling s lasix but this does nothing for the 3rd spacing going on in her chest  rec trial of low dose maint aldatazide s any k in pt on acei and recheck labs/cxr in 4 weeks    I had an extended discussion with the patient/daughter reviewing all relevant studies completed to date and  lasting  75minutes of a 60 minute visit    Each maintenance medication was reviewed in detail including most importantly the difference between maintenance and prns and under what circumstances the prns are to be triggered using an action plan format that is not reflected in the computer generated alphabetically organized AVS.    Please see instructions for details which were reviewed in writing and the patient given a copy highlighting the part that I personally wrote and discussed at today's ov.

## 2015-02-28 DIAGNOSIS — I130 Hypertensive heart and chronic kidney disease with heart failure and stage 1 through stage 4 chronic kidney disease, or unspecified chronic kidney disease: Secondary | ICD-10-CM | POA: Diagnosis not present

## 2015-02-28 DIAGNOSIS — I69354 Hemiplegia and hemiparesis following cerebral infarction affecting left non-dominant side: Secondary | ICD-10-CM | POA: Diagnosis not present

## 2015-02-28 DIAGNOSIS — I2510 Atherosclerotic heart disease of native coronary artery without angina pectoris: Secondary | ICD-10-CM | POA: Diagnosis not present

## 2015-02-28 DIAGNOSIS — M170 Bilateral primary osteoarthritis of knee: Secondary | ICD-10-CM | POA: Diagnosis not present

## 2015-02-28 DIAGNOSIS — E1122 Type 2 diabetes mellitus with diabetic chronic kidney disease: Secondary | ICD-10-CM | POA: Diagnosis not present

## 2015-03-01 DIAGNOSIS — I2510 Atherosclerotic heart disease of native coronary artery without angina pectoris: Secondary | ICD-10-CM | POA: Diagnosis not present

## 2015-03-01 DIAGNOSIS — E1122 Type 2 diabetes mellitus with diabetic chronic kidney disease: Secondary | ICD-10-CM | POA: Diagnosis not present

## 2015-03-01 DIAGNOSIS — M170 Bilateral primary osteoarthritis of knee: Secondary | ICD-10-CM | POA: Diagnosis not present

## 2015-03-01 DIAGNOSIS — I130 Hypertensive heart and chronic kidney disease with heart failure and stage 1 through stage 4 chronic kidney disease, or unspecified chronic kidney disease: Secondary | ICD-10-CM | POA: Diagnosis not present

## 2015-03-01 DIAGNOSIS — I69354 Hemiplegia and hemiparesis following cerebral infarction affecting left non-dominant side: Secondary | ICD-10-CM | POA: Diagnosis not present

## 2015-03-02 ENCOUNTER — Telehealth: Payer: Self-pay | Admitting: *Deleted

## 2015-03-02 DIAGNOSIS — I2510 Atherosclerotic heart disease of native coronary artery without angina pectoris: Secondary | ICD-10-CM | POA: Diagnosis not present

## 2015-03-02 DIAGNOSIS — M170 Bilateral primary osteoarthritis of knee: Secondary | ICD-10-CM | POA: Diagnosis not present

## 2015-03-02 DIAGNOSIS — I130 Hypertensive heart and chronic kidney disease with heart failure and stage 1 through stage 4 chronic kidney disease, or unspecified chronic kidney disease: Secondary | ICD-10-CM | POA: Diagnosis not present

## 2015-03-02 DIAGNOSIS — E1122 Type 2 diabetes mellitus with diabetic chronic kidney disease: Secondary | ICD-10-CM | POA: Diagnosis not present

## 2015-03-02 DIAGNOSIS — I69354 Hemiplegia and hemiparesis following cerebral infarction affecting left non-dominant side: Secondary | ICD-10-CM | POA: Diagnosis not present

## 2015-03-02 NOTE — Telephone Encounter (Signed)
Signed order for evaluation of Medical Social Services need for care.  Signed order for MSW 1 week faxed to [facility].

## 2015-03-09 DIAGNOSIS — I130 Hypertensive heart and chronic kidney disease with heart failure and stage 1 through stage 4 chronic kidney disease, or unspecified chronic kidney disease: Secondary | ICD-10-CM | POA: Diagnosis not present

## 2015-03-09 DIAGNOSIS — I69354 Hemiplegia and hemiparesis following cerebral infarction affecting left non-dominant side: Secondary | ICD-10-CM | POA: Diagnosis not present

## 2015-03-09 DIAGNOSIS — I2510 Atherosclerotic heart disease of native coronary artery without angina pectoris: Secondary | ICD-10-CM | POA: Diagnosis not present

## 2015-03-09 DIAGNOSIS — M170 Bilateral primary osteoarthritis of knee: Secondary | ICD-10-CM | POA: Diagnosis not present

## 2015-03-09 DIAGNOSIS — E1122 Type 2 diabetes mellitus with diabetic chronic kidney disease: Secondary | ICD-10-CM | POA: Diagnosis not present

## 2015-03-10 ENCOUNTER — Telehealth: Payer: Self-pay | Admitting: Cardiovascular Disease

## 2015-03-10 NOTE — Telephone Encounter (Signed)
Mya is calling to get orders for this OT this week back a week . Please call   Thanks

## 2015-03-10 NOTE — Telephone Encounter (Signed)
Called Mya from [facility] to let her know she needs to get an order for OT from patients Primary Care provider Thressa Sheller MD

## 2015-03-16 DIAGNOSIS — I130 Hypertensive heart and chronic kidney disease with heart failure and stage 1 through stage 4 chronic kidney disease, or unspecified chronic kidney disease: Secondary | ICD-10-CM | POA: Diagnosis not present

## 2015-03-16 DIAGNOSIS — E1122 Type 2 diabetes mellitus with diabetic chronic kidney disease: Secondary | ICD-10-CM | POA: Diagnosis not present

## 2015-03-16 DIAGNOSIS — M170 Bilateral primary osteoarthritis of knee: Secondary | ICD-10-CM | POA: Diagnosis not present

## 2015-03-16 DIAGNOSIS — I69354 Hemiplegia and hemiparesis following cerebral infarction affecting left non-dominant side: Secondary | ICD-10-CM | POA: Diagnosis not present

## 2015-03-16 DIAGNOSIS — I2510 Atherosclerotic heart disease of native coronary artery without angina pectoris: Secondary | ICD-10-CM | POA: Diagnosis not present

## 2015-03-17 ENCOUNTER — Other Ambulatory Visit: Payer: Self-pay | Admitting: Cardiovascular Disease

## 2015-03-17 ENCOUNTER — Telehealth: Payer: Self-pay | Admitting: *Deleted

## 2015-03-17 NOTE — Telephone Encounter (Signed)
Signed orders for medical social services, OT to monitor and assess for In-Home care/assist.

## 2015-03-17 NOTE — Telephone Encounter (Signed)
REFILL 

## 2015-03-18 DIAGNOSIS — I2510 Atherosclerotic heart disease of native coronary artery without angina pectoris: Secondary | ICD-10-CM | POA: Diagnosis not present

## 2015-03-18 DIAGNOSIS — E1122 Type 2 diabetes mellitus with diabetic chronic kidney disease: Secondary | ICD-10-CM | POA: Diagnosis not present

## 2015-03-18 DIAGNOSIS — M170 Bilateral primary osteoarthritis of knee: Secondary | ICD-10-CM | POA: Diagnosis not present

## 2015-03-18 DIAGNOSIS — I130 Hypertensive heart and chronic kidney disease with heart failure and stage 1 through stage 4 chronic kidney disease, or unspecified chronic kidney disease: Secondary | ICD-10-CM | POA: Diagnosis not present

## 2015-03-18 DIAGNOSIS — I69354 Hemiplegia and hemiparesis following cerebral infarction affecting left non-dominant side: Secondary | ICD-10-CM | POA: Diagnosis not present

## 2015-03-22 DIAGNOSIS — I69354 Hemiplegia and hemiparesis following cerebral infarction affecting left non-dominant side: Secondary | ICD-10-CM | POA: Diagnosis not present

## 2015-03-22 DIAGNOSIS — M170 Bilateral primary osteoarthritis of knee: Secondary | ICD-10-CM | POA: Diagnosis not present

## 2015-03-22 DIAGNOSIS — E1122 Type 2 diabetes mellitus with diabetic chronic kidney disease: Secondary | ICD-10-CM | POA: Diagnosis not present

## 2015-03-22 DIAGNOSIS — I130 Hypertensive heart and chronic kidney disease with heart failure and stage 1 through stage 4 chronic kidney disease, or unspecified chronic kidney disease: Secondary | ICD-10-CM | POA: Diagnosis not present

## 2015-03-22 DIAGNOSIS — I2510 Atherosclerotic heart disease of native coronary artery without angina pectoris: Secondary | ICD-10-CM | POA: Diagnosis not present

## 2015-03-23 DIAGNOSIS — E039 Hypothyroidism, unspecified: Secondary | ICD-10-CM | POA: Diagnosis not present

## 2015-03-23 DIAGNOSIS — E119 Type 2 diabetes mellitus without complications: Secondary | ICD-10-CM | POA: Diagnosis not present

## 2015-03-23 DIAGNOSIS — I10 Essential (primary) hypertension: Secondary | ICD-10-CM | POA: Diagnosis not present

## 2015-03-23 DIAGNOSIS — E785 Hyperlipidemia, unspecified: Secondary | ICD-10-CM | POA: Diagnosis not present

## 2015-03-23 DIAGNOSIS — I2510 Atherosclerotic heart disease of native coronary artery without angina pectoris: Secondary | ICD-10-CM | POA: Diagnosis not present

## 2015-03-25 ENCOUNTER — Other Ambulatory Visit: Payer: Self-pay | Admitting: Internal Medicine

## 2015-03-25 DIAGNOSIS — I130 Hypertensive heart and chronic kidney disease with heart failure and stage 1 through stage 4 chronic kidney disease, or unspecified chronic kidney disease: Secondary | ICD-10-CM | POA: Diagnosis not present

## 2015-03-25 DIAGNOSIS — E1122 Type 2 diabetes mellitus with diabetic chronic kidney disease: Secondary | ICD-10-CM | POA: Diagnosis not present

## 2015-03-25 DIAGNOSIS — I69354 Hemiplegia and hemiparesis following cerebral infarction affecting left non-dominant side: Secondary | ICD-10-CM | POA: Diagnosis not present

## 2015-03-25 DIAGNOSIS — M170 Bilateral primary osteoarthritis of knee: Secondary | ICD-10-CM | POA: Diagnosis not present

## 2015-03-25 DIAGNOSIS — I2510 Atherosclerotic heart disease of native coronary artery without angina pectoris: Secondary | ICD-10-CM | POA: Diagnosis not present

## 2015-03-29 ENCOUNTER — Ambulatory Visit (INDEPENDENT_AMBULATORY_CARE_PROVIDER_SITE_OTHER): Payer: Medicare HMO | Admitting: Internal Medicine

## 2015-03-29 ENCOUNTER — Ambulatory Visit (INDEPENDENT_AMBULATORY_CARE_PROVIDER_SITE_OTHER)
Admission: RE | Admit: 2015-03-29 | Discharge: 2015-03-29 | Disposition: A | Discharge status: Home / Self Care | Payer: Medicare HMO | Source: Ambulatory Visit | Attending: Internal Medicine | Admitting: Internal Medicine

## 2015-03-29 ENCOUNTER — Telehealth: Payer: Self-pay | Admitting: Internal Medicine

## 2015-03-29 ENCOUNTER — Encounter: Payer: Self-pay | Admitting: Internal Medicine

## 2015-03-29 ENCOUNTER — Other Ambulatory Visit (INDEPENDENT_AMBULATORY_CARE_PROVIDER_SITE_OTHER): Payer: Medicare HMO

## 2015-03-29 VITALS — BP 122/70 | HR 62 | Ht 66.5 in | Wt 116.6 lb

## 2015-03-29 DIAGNOSIS — J90 Pleural effusion, not elsewhere classified: Secondary | ICD-10-CM

## 2015-03-29 DIAGNOSIS — R918 Other nonspecific abnormal finding of lung field: Secondary | ICD-10-CM

## 2015-03-29 DIAGNOSIS — J449 Chronic obstructive pulmonary disease, unspecified: Secondary | ICD-10-CM | POA: Diagnosis not present

## 2015-03-29 LAB — BASIC METABOLIC PANEL
BUN: 52 mg/dL — AB (ref 6–23)
CALCIUM: 10.3 mg/dL (ref 8.4–10.5)
CHLORIDE: 98 meq/L (ref 96–112)
CO2: 33 meq/L — AB (ref 19–32)
CREATININE: 0.9 mg/dL (ref 0.40–1.20)
GFR: 62.74 mL/min (ref >60.00)
GLUCOSE: 127 mg/dL — AB (ref 70–99)
Potassium: 4.9 mEq/L (ref 3.5–5.1)
Sodium: 136 mEq/L (ref 135–145)

## 2015-03-29 LAB — BRAIN NATRIURETIC PEPTIDE: Pro B Natriuretic peptide (BNP): 316 pg/mL — ABNORMAL HIGH (ref 0.0–100.0)

## 2015-03-29 NOTE — Progress Notes (Signed)
Subjective:    Patient ID: Marissa Jefferson, female    DOB: [birth year]     MRN: [id number]     Brief patient profile:  77 yowf  Quit smoking 1948 referred to pulmonary clinic 02/25/2015 by Dr Thressa Sheller  For eval of R >> L pleural effusion    History of Present Illness  02/26/2015 1st [facility] office visit/ Marissa Jefferson   Chief Complaint  Patient presents with  . Pulmonary Consult    Referred by Dr. Noah Delaine for eval of abnormal ct chest.   pt has documented ishemic CM s/p cabg 10/09/13 with persistent leg swelling attributed to chf by cards but has been reluctant to take lasix x last 6 m and instead elevates her legs or wears compression hose and able to sleep comfortably on 2-3 pillows  rec Aldactazide 25/25 one daily  As needed for swelling > lasix one daily and no potassium at all    03/29/2015  f/u ov/Marissa Jefferson re: bilateral effusions / elevated bnp / on aldactazide 25/25 daily / no lasix needed  Chief Complaint  Patient presents with  . Follow-up    Concerned about wt loss. She is eating well and drinking boost and still losing wt.    Not limited by breathing from desired activities    No obvious day to day or daytime variability or assoc chronic cough or cp or chest tightness, subjective wheeze or overt sinus or hb symptoms. No unusual exp hx or h/o childhood pna/ asthma or knowledge of premature birth.  Sleeping ok without nocturnal  or early am exacerbation  of respiratory  c/o's or need for noct saba. Also denies any obvious fluctuation of symptoms with weather or environmental changes or other aggravating or alleviating factors except as outlined above   Current Medications, Allergies, Complete Past Medical History, Past Surgical History, Family History, and Social History were reviewed in [employer] record.  ROS  The following are not active complaints unless bolded sore throat, dysphagia, dental problems, itching, sneezing,  nasal congestion or  excess/ purulent secretions, ear ache,   fever, chills, sweats, unintended wt loss(down p diuresis) , classically pleuritic or exertional cp, hemoptysis,  orthopnea pnd or leg swelling better , presyncope, palpitations, abdominal pain, anorexia, nausea, vomiting, diarrhea  or change in bowel or bladder habits, change in stools or urine, dysuria,hematuria,  rash, arthralgias, visual complaints, headache, numbness, weakness or ataxia or problems with walking or coordination,  change in mood/affect or memory.          Objective:   Physical Exam  amb wf nad  03/29/2015        117  02/25/15 129 lb (58.514 kg)  01/03/15 121 lb (54.885 kg)  10/25/14 137 lb 1.9 oz (62.197 kg)    Vital signs reviewed   HEENT: nl dentition, turbinates, and oropharynx. Nl external ear canals without cough reflex   NECK :  without JVD/Nodes/TM/ nl carotid upstrokes bilaterally   LUNGS: no acc muscle use,  slt barrel  chest with now clear bs bilaterally  without cough on insp or exp maneuvers   CV:  RRR  no s3  II/VI SEM, no def increase in P2,   Edema resolved   ABD:  soft and nontender with nl inspiratory excursion in the supine position. No bruits or organomegaly, bowel sounds nl  MS:  Nl gait/ ext warm without deformities, calf tenderness, cyanosis or clubbing No obvious joint restrictions   SKIN: warm and dry without lesions  NEURO:  alert, approp, nl sensorium with  no motor deficits      CXR PA and Lateral:   03/29/2015 :    I personally reviewed images and agree with radiology impression as follows:   Enlargement of cardiac silhouette post CABG. No acute abnormalities My impression:  Bilateral pleural effusions are almost entirely resolved..     Chemistry      Component Value Date/Time   NA 136 03/29/2015 1105   K 4.9 03/29/2015 1105   CL 98 03/29/2015 1105   CO2 33* 03/29/2015 1105   BUN 52* 03/29/2015 1105   CREATININE 0.90 03/29/2015 1105   CREATININE 0.65 05/10/2014 1440        Component Value Date/Time   CALCIUM 10.3 03/29/2015 1105   ALKPHOS 74 02/25/2015 1555   AST 41* 02/25/2015 1555   ALT 46* 02/25/2015 1555   BILITOT 0.5 02/25/2015 1555           Assessment & Plan:

## 2015-03-29 NOTE — Progress Notes (Signed)
Quick Note:  Spoke with pt and notified of results per Dr. Wert. Pt verbalized understanding and denied any questions.  ______ 

## 2015-03-29 NOTE — Patient Instructions (Signed)
Continue aldactazide one daily   For flare of leg swelling ok to take lasix (furosemide)   IF NEEDED   Please remember to go to the lab and x-ray department downstairs for your tests - we will call you with the results when they are available.    If you are satisfied with your treatment plan,  let your doctor know and he/she can either refill your medications or you can return here when your prescription runs out.     If in any way you are not 100% satisfied,  please tell us.  If 100% better, tell your friends!  Pulmonary follow up is as needed

## 2015-03-29 NOTE — Telephone Encounter (Signed)
Spoke with the pt  She wanted to verify her cxr results  I have went over this with her again and nothing further needed

## 2015-03-30 ENCOUNTER — Encounter: Payer: Self-pay | Admitting: Internal Medicine

## 2015-03-30 NOTE — Assessment & Plan Note (Signed)
Echo 03/22/13 - Left ventricle: Inferobasal hypokinesis The cavity size was normal. Wall thickness was increased in a pattern of moderate LVH. The estimated ejection fraction was 55%. - Aortic valve: small systolic gradient with no significant stenosis - Mitral valve: There was mild regurgitation. - Right ventricle: The cavity size was mildly dilated. - Atrial septum: No defect or patent foramen ovale was identified. - Pulmonary arteries: PA peak pressure: 46 mm Hg (S). Eval in pulmonary clinic 02/25/2015 rec aldactazide 25/25/ one daily and d/c all K> 90% resolved  03/29/2015    I had an extended final summary discussion with the patient reviewing all relevant studies completed to date and  lasting 15 to 20 minutes of a 25 minute visit on the following issues:    Clearly these effusions are related to hydrostatic forces namely congestive heart failure and nonadherence by the patient who was previously instructed to use Lasix but didn't really understand how to use it appropriately. I spent extra time with this patient going over the concept of a maintenance versus a  Prn med  to clarify how she should use not only her diuretics for all of her medicines. Namely, the one she takes automatically are  1 category in the ones she uses on an as-needed basis are  in another. In this particular case I think she should continue the low-dose Aldactazide 1 daily and add Lasix only if needed for excess leg swelling. Should she develop increased problems with orthostasis or renal insufficiency of course we would need to adjust the Aldactazide down but I'm going to defer that to the capable hands of her primary physician see her here on a when necessary basis only.  After we had given her these instructions verbally and in writing she return to the office for more instructions on this concept and was focused on the fact that she's lost weight while on Aldactazide. I also defer this issue to primary caregiver  pointed out to her the whole point of the Aldactazide was to reduce her down to a drier weight..  Each maintenance medication was reviewed in detail including most importantly the difference between maintenance and as needed and under what circumstances the prns are to be used.  Please see instructions for details which were reviewed in writing and the patient given a copy.

## 2015-03-30 NOTE — Assessment & Plan Note (Signed)
See CT Chest 01/20/15 > in tickle file for repeat s contrast 01/20/16  As they likely will prove to be completely benign

## 2015-04-27 DIAGNOSIS — M810 Age-related osteoporosis without current pathological fracture: Secondary | ICD-10-CM | POA: Diagnosis not present

## 2015-04-27 DIAGNOSIS — E1122 Type 2 diabetes mellitus with diabetic chronic kidney disease: Secondary | ICD-10-CM | POA: Diagnosis not present

## 2015-04-27 DIAGNOSIS — I130 Hypertensive heart and chronic kidney disease with heart failure and stage 1 through stage 4 chronic kidney disease, or unspecified chronic kidney disease: Secondary | ICD-10-CM | POA: Diagnosis not present

## 2015-05-04 DIAGNOSIS — R69 Illness, unspecified: Secondary | ICD-10-CM | POA: Diagnosis not present

## 2015-05-04 DIAGNOSIS — I5032 Chronic diastolic (congestive) heart failure: Secondary | ICD-10-CM | POA: Diagnosis not present

## 2015-05-04 DIAGNOSIS — E1122 Type 2 diabetes mellitus with diabetic chronic kidney disease: Secondary | ICD-10-CM | POA: Diagnosis not present

## 2015-05-04 DIAGNOSIS — I129 Hypertensive chronic kidney disease with stage 1 through stage 4 chronic kidney disease, or unspecified chronic kidney disease: Secondary | ICD-10-CM | POA: Diagnosis not present

## 2015-05-16 ENCOUNTER — Other Ambulatory Visit: Payer: Self-pay | Admitting: Cardiovascular Disease

## 2015-05-16 ENCOUNTER — Telehealth: Payer: Self-pay | Admitting: Internal Medicine

## 2015-05-16 NOTE — Telephone Encounter (Signed)
Pt calling to let us know that she got the answer to the question she was calling about no need for a call back.Marissa Jefferson

## 2015-05-16 NOTE — Telephone Encounter (Signed)
Patient wants to see if she needs to continue taking this or not.

## 2015-05-16 NOTE — Telephone Encounter (Signed)
Rx(s) sent to pharmacy electronically.  

## 2015-05-16 NOTE — Telephone Encounter (Signed)
Left message for patient to call back  

## 2015-06-16 IMAGING — CR DG CHEST 2V
2 series · 2 of 2 positions shown · non-contrast
Comparison: Chest CT dated 10/07/2013

CLINICAL DATA: Pre operative respiratory exam. Coronary artery
disease.

EXAM:
CHEST  2 VIEW

[w chest pa]
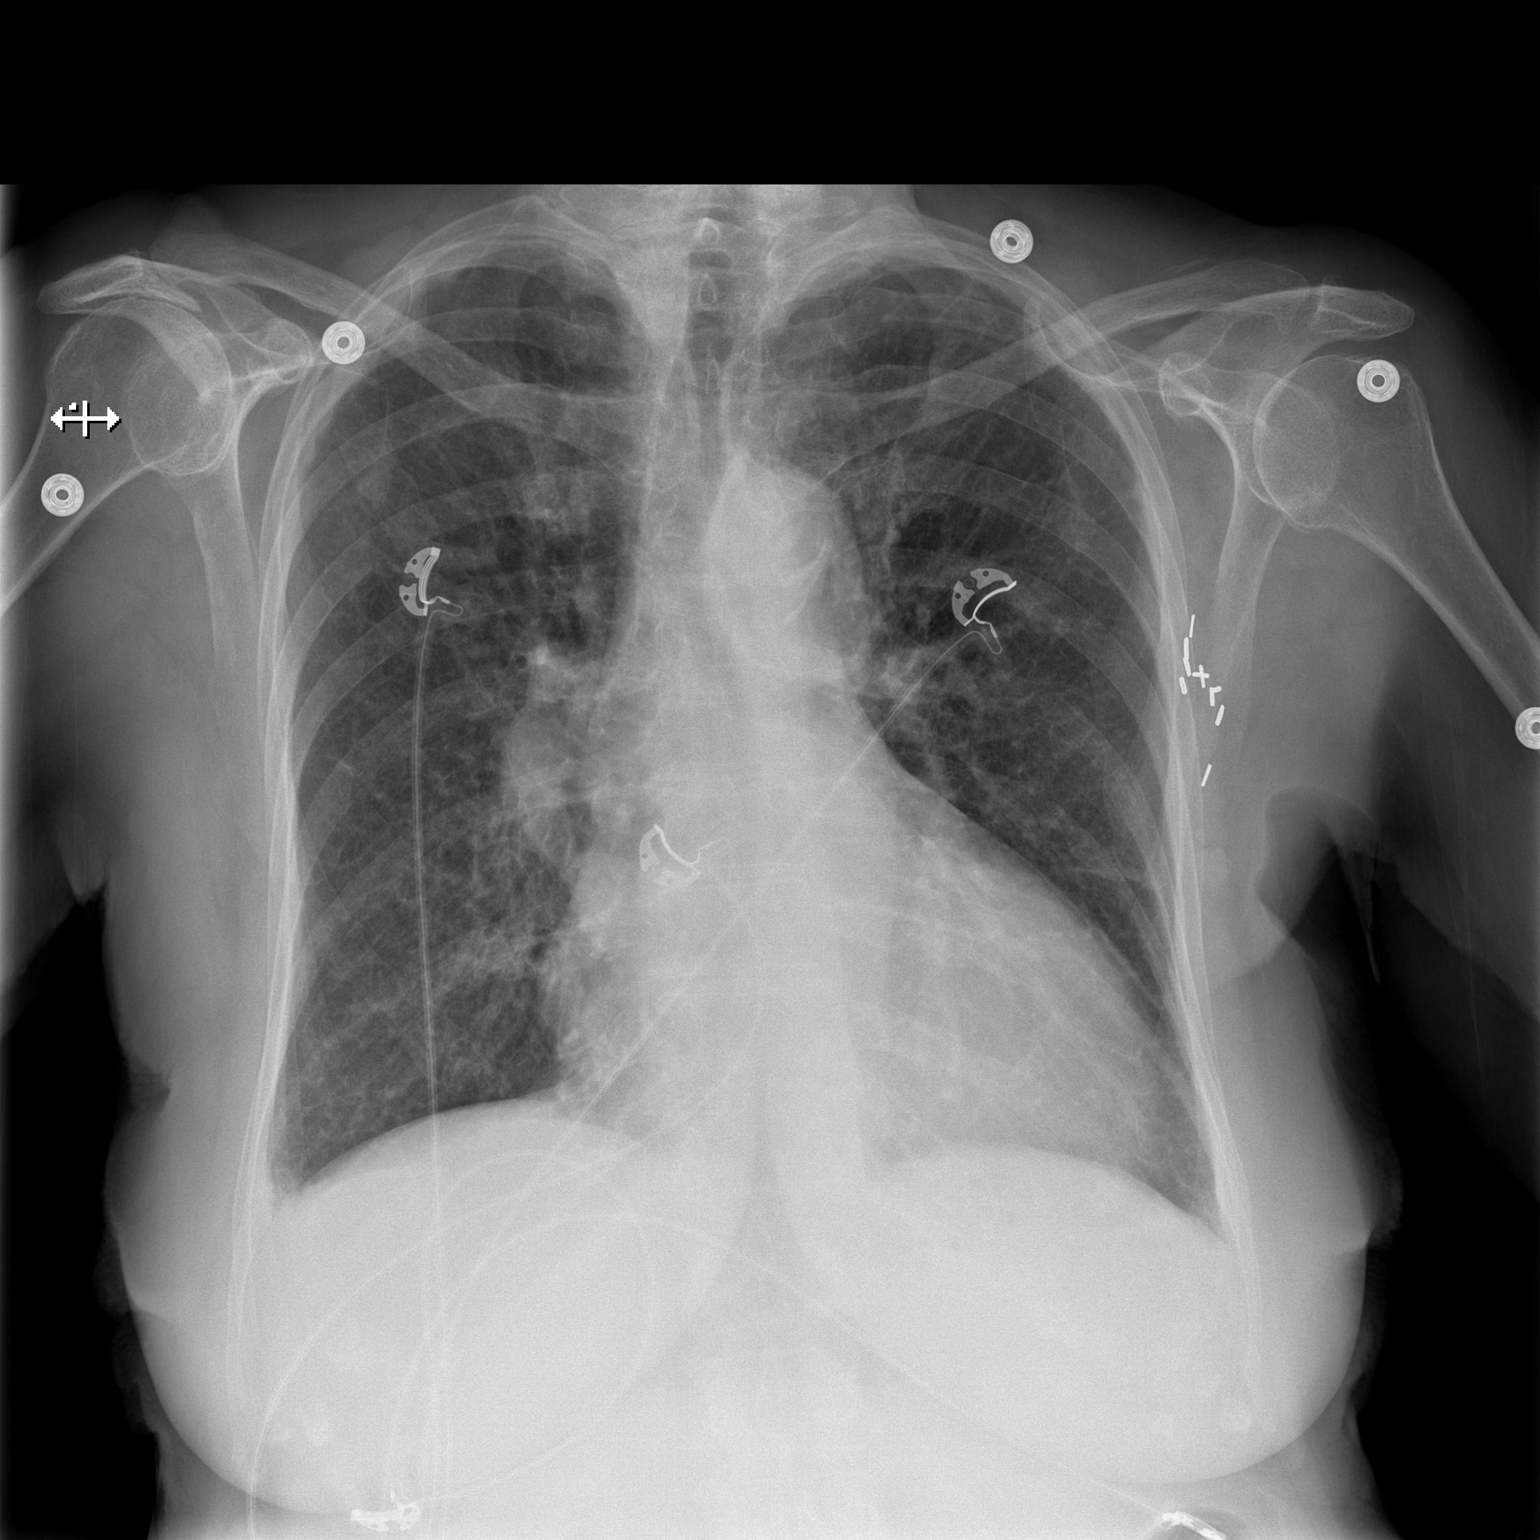

[w chest lat]
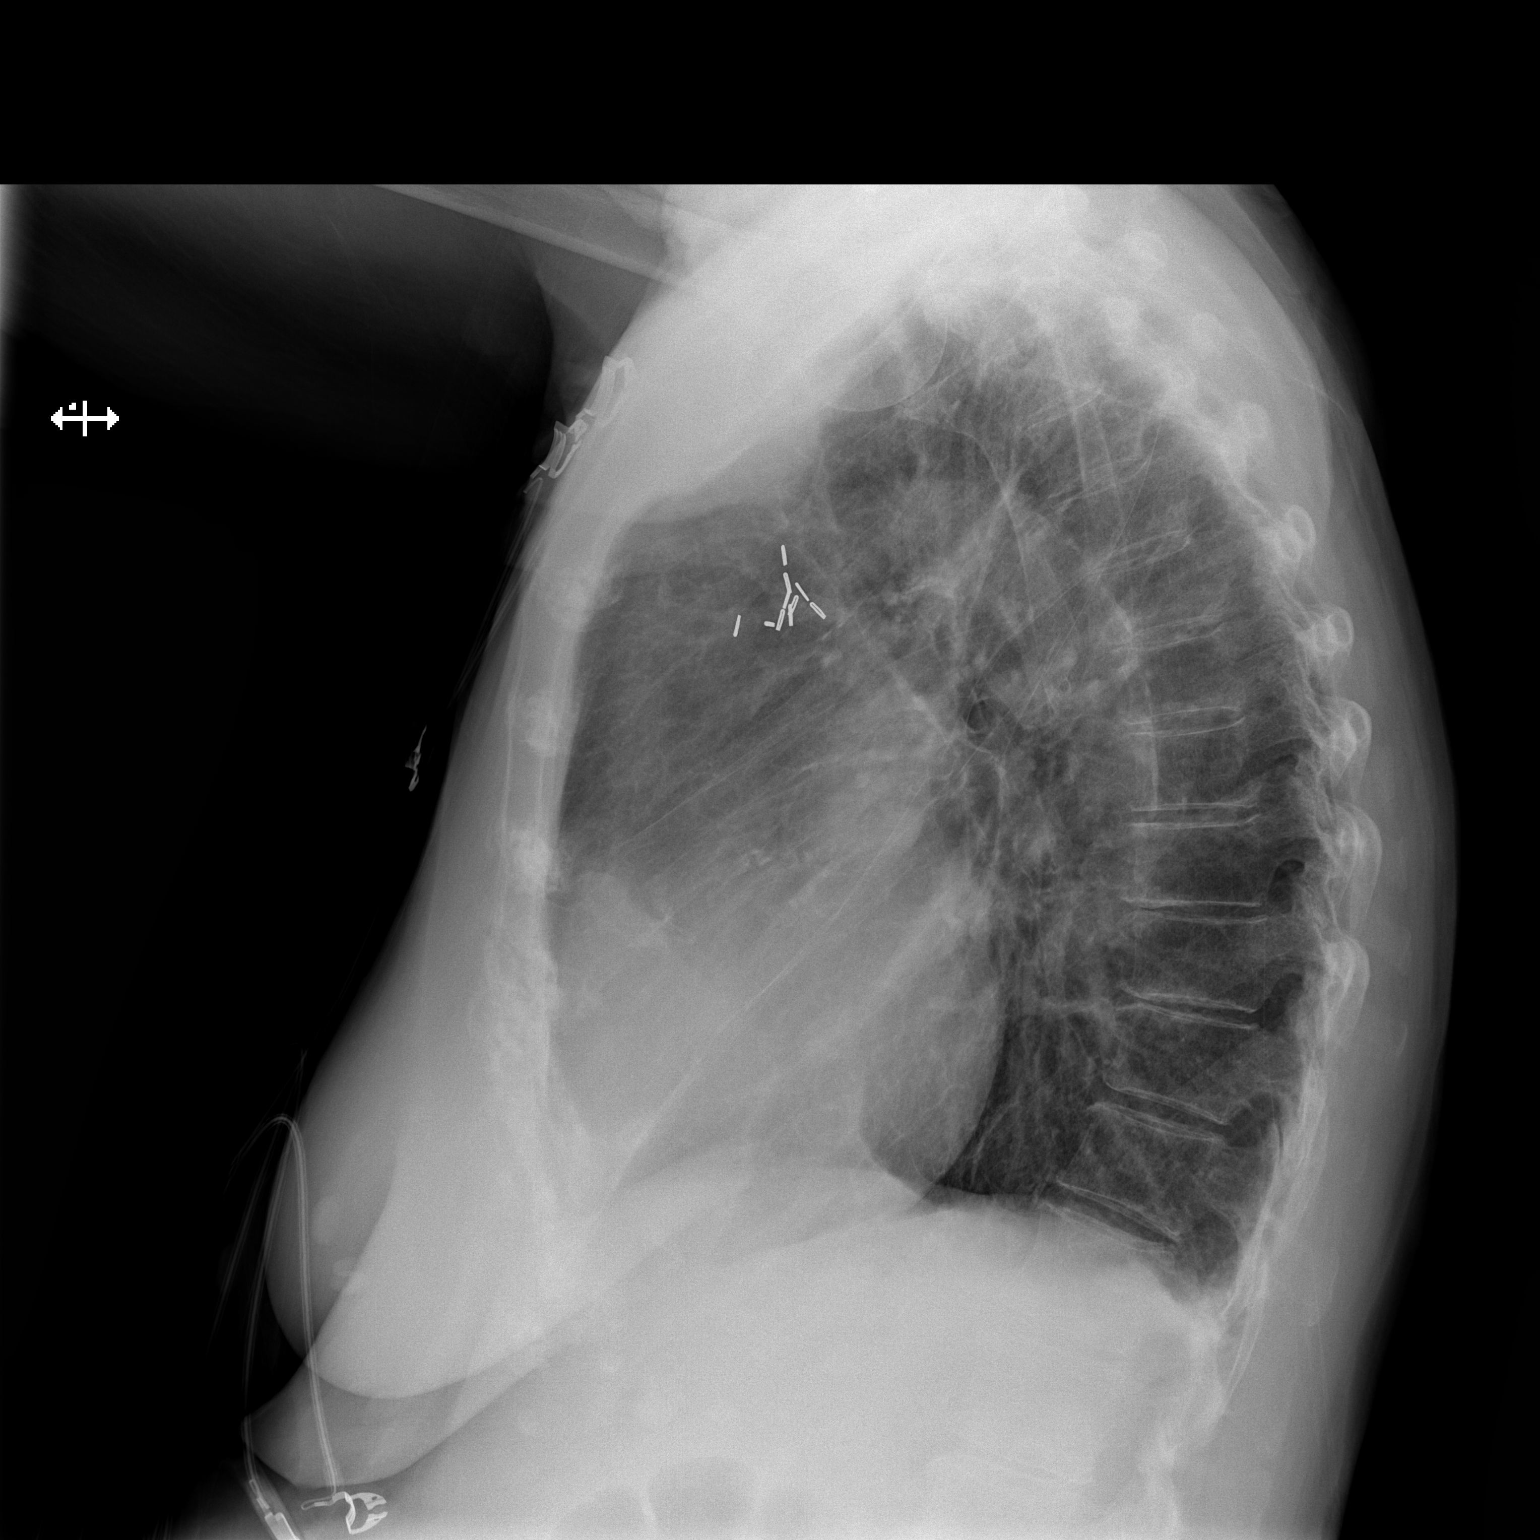

[2 of 2 positions shown; findings below may reference images not displayed]

FINDINGS: Heart size and pulmonary vascularity are normal. There is slight
scarring in the lung apices. No infiltrates or effusions. No acute
osseus abnormality.
IMPRESSION: No acute disease.

## 2015-06-17 IMAGING — CR DG CHEST 1V PORT
1 series · 1 of 1 positions shown · non-contrast
Comparison: Preoperative study October 08, 2013

CLINICAL DATA: Postoperative study from after coronary bypass

EXAM:
PORTABLE CHEST - 1 VIEW

[AP]
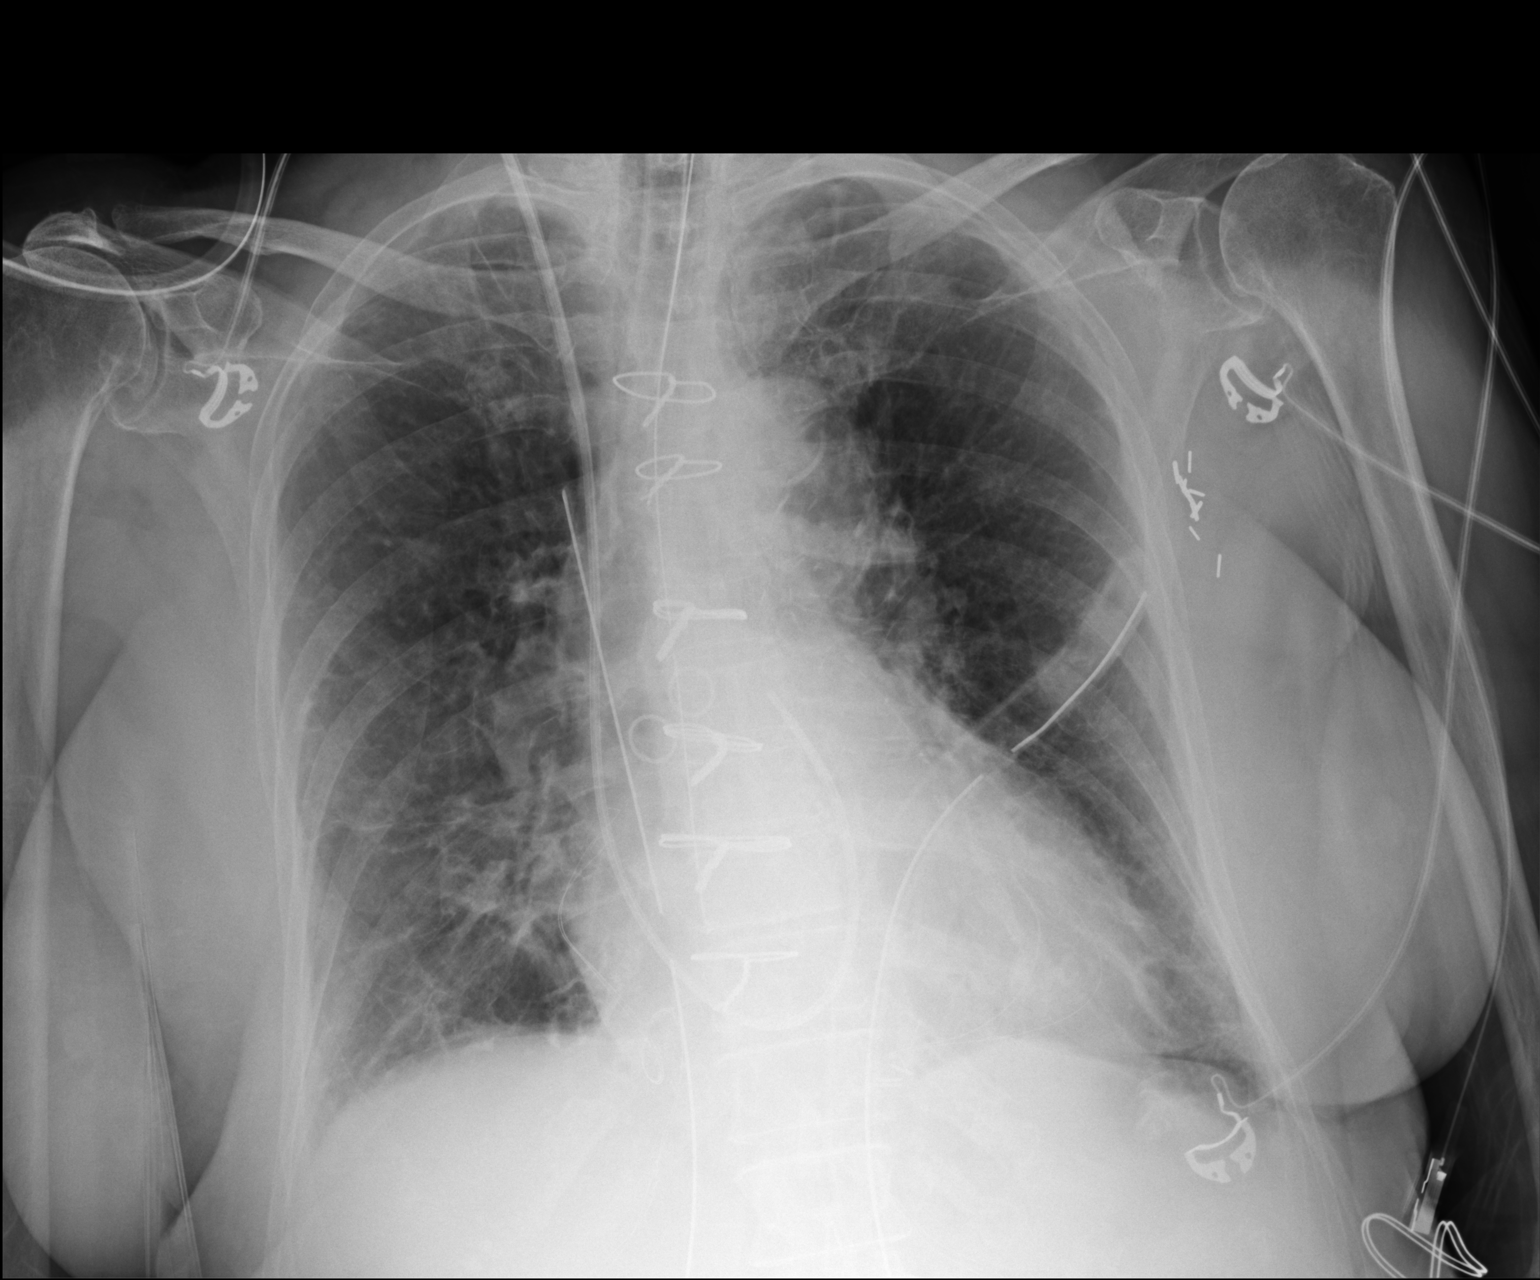

[1 of 1 positions shown; findings below may reference images not displayed]

FINDINGS: The lungs are well-expanded. There is stable mildly increased
interstitial markings. The cardiac silhouette is top-normal in size
but stable. The pulmonary vascularity is not engorged.

The left-sided chest tube tip projects over the lateral aspect of
the 6 rib. The right-sided chest tube -mediastinal drain has its tip
projecting over the medial aspect of the right sixth rib. There is
no pneumothorax or significant pleural effusion. The Swan-Ganz
catheter tip lies in the region of the right main pulmonary artery.
The endotracheal tube tip lies 4.5 cm above the crotch of the
carina. The esophagogastric tube tip terminates in the distal third
of the esophagus. There left axillary vascular clips from previous
lymph node dissection.
IMPRESSION: COPD. There is no significant pulmonary edema. The support tubes and
lines are in appropriate position with the exception of the
esophagogastric tube whose tip terminates in the distal third of the
esophagus. Advancement by approximately 20 cm is recommended.

## 2015-06-19 IMAGING — CR DG CHEST 1V PORT
1 series · 1 of 1 positions shown · non-contrast
Comparison: 10/10/2013

CLINICAL DATA: Postop cardiac surgery

EXAM:
PORTABLE CHEST - 1 VIEW

[AP]
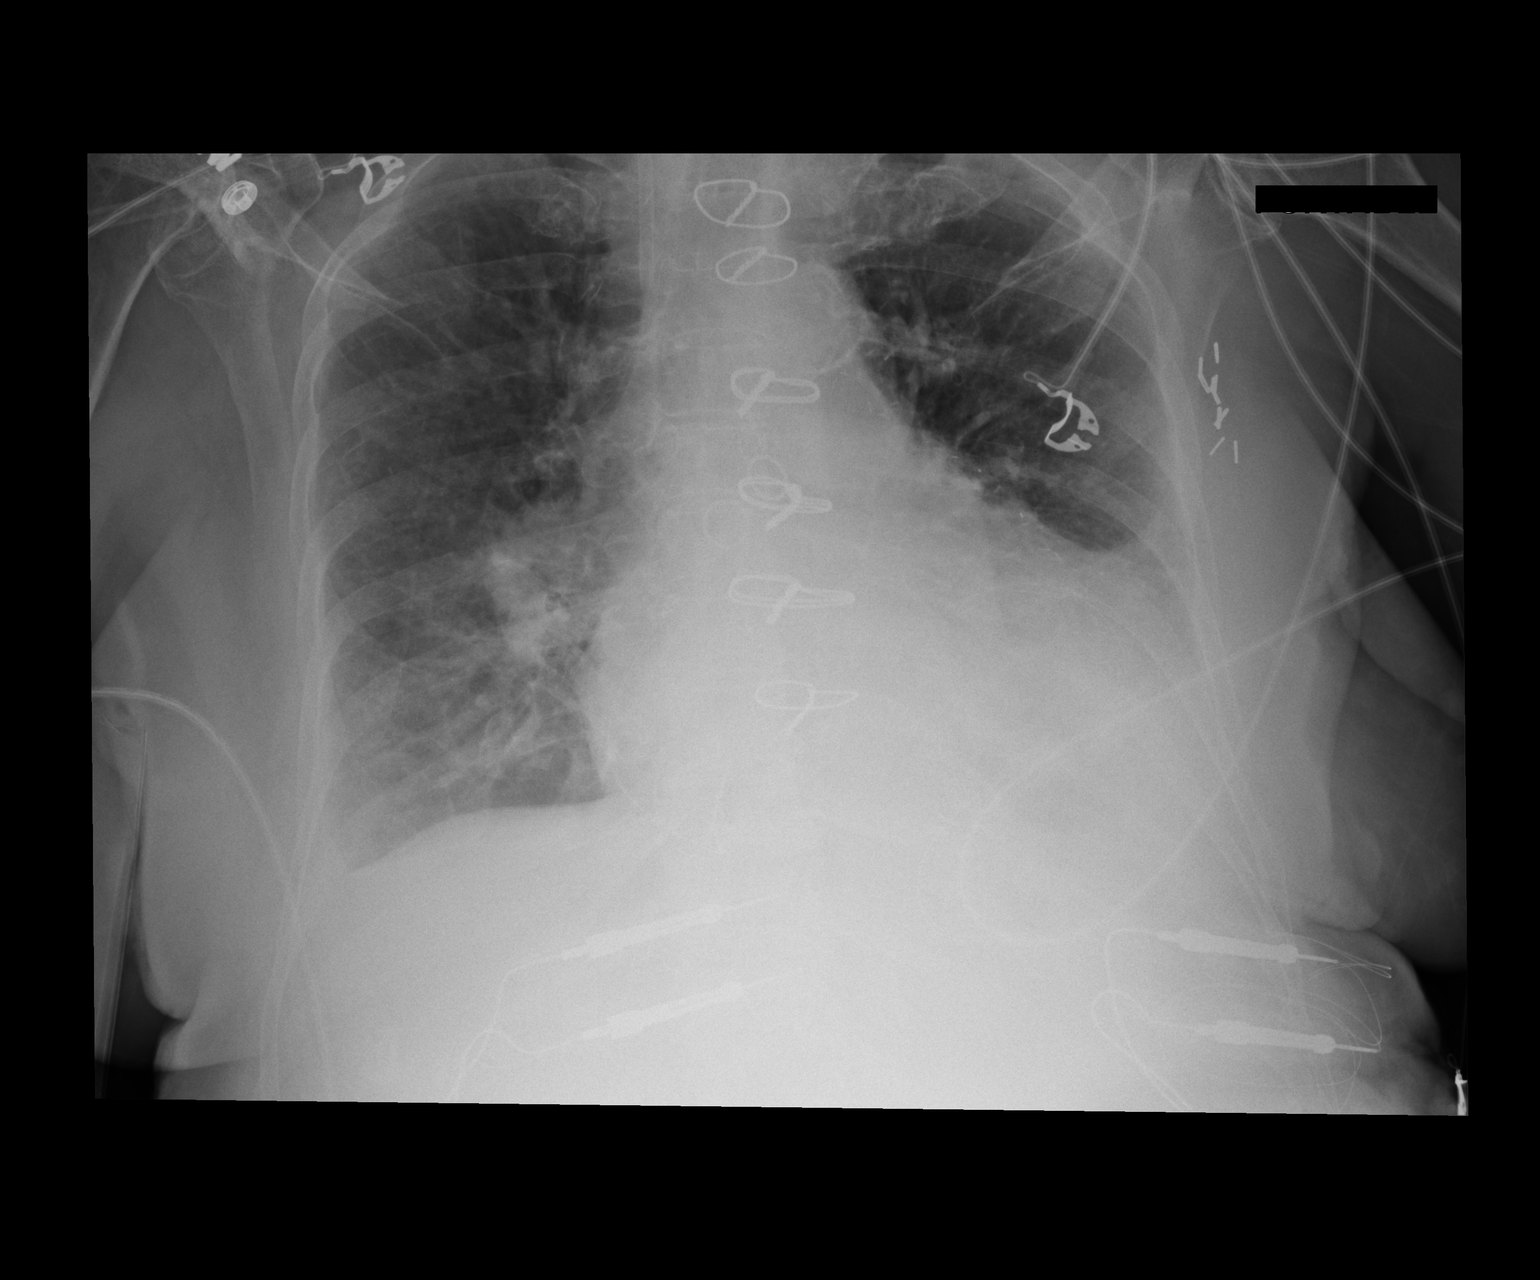

[1 of 1 positions shown; findings below may reference images not displayed]

FINDINGS: Interval removal of endotracheal tube, enteric tube, right IJ
Swan-Ganz catheter, left chest tube, and mediastinal drain. Residual
right IJ venous sheath.

Moderate left pleural effusion. Trace right pleural effusion. No
frank interstitial edema.

Underlying chronic interstitial markings.  No pneumothorax.

The heart is top-normal in size. Postsurgical changes related to
prior CABG.
IMPRESSION: Postsurgical changes related to prior CABG with moderate left
pleural effusion. No pneumothorax.

## 2015-06-23 IMAGING — CR DG CHEST 1V PORT
1 series · 1 of 1 positions shown · non-contrast
Comparison: 10/13/2013 and 10/12/2013

CLINICAL DATA: Coronary artery disease.

EXAM:
PORTABLE CHEST - 1 VIEW

[AP]
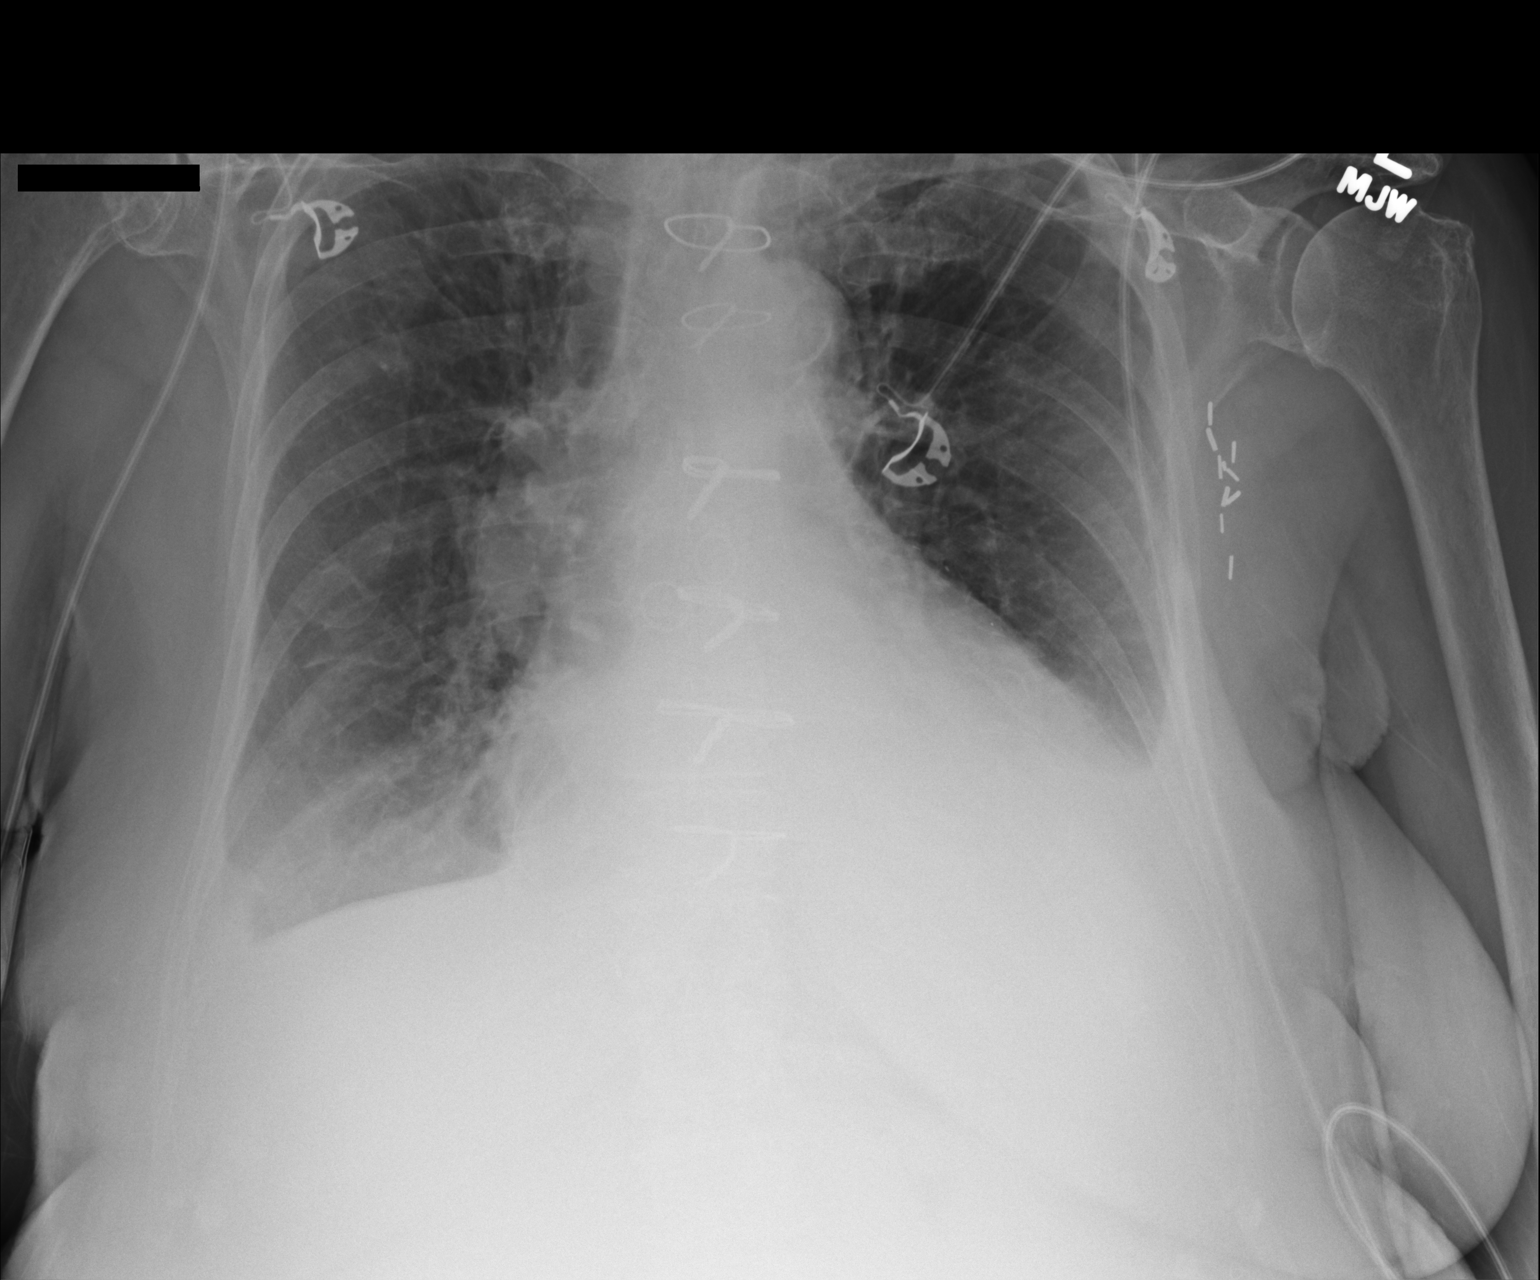

[1 of 1 positions shown; findings below may reference images not displayed]

FINDINGS: There is persistent cardiomegaly. Pulmonary vascularity is normal.
Bilateral effusions are slightly diminished. No pulmonary edema.
IMPRESSION: Slight decrease in bilateral effusions.

## 2015-07-04 ENCOUNTER — Ambulatory Visit (INDEPENDENT_AMBULATORY_CARE_PROVIDER_SITE_OTHER): Payer: Medicare HMO | Admitting: Cardiovascular Disease

## 2015-07-04 ENCOUNTER — Encounter: Payer: Self-pay | Admitting: Cardiovascular Disease

## 2015-07-04 VITALS — BP 168/72 | HR 77 | Ht 66.0 in | Wt 133.0 lb

## 2015-07-04 DIAGNOSIS — I10 Essential (primary) hypertension: Secondary | ICD-10-CM

## 2015-07-04 DIAGNOSIS — I2510 Atherosclerotic heart disease of native coronary artery without angina pectoris: Secondary | ICD-10-CM

## 2015-07-04 DIAGNOSIS — E1159 Type 2 diabetes mellitus with other circulatory complications: Secondary | ICD-10-CM

## 2015-07-04 DIAGNOSIS — I5042 Chronic combined systolic (congestive) and diastolic (congestive) heart failure: Secondary | ICD-10-CM

## 2015-07-04 MED ORDER — CARVEDILOL 25 MG PO TABS: 25.0000 mg | ORAL_TABLET | Freq: Two times a day (BID) | ORAL | Status: DC

## 2015-07-04 NOTE — Progress Notes (Signed)
Patient ID: Marissa Jefferson, female   DOB: [birth year], 82 y.o.   MRN: [id number]    Cardiology Office Note    Date:  07/05/2015   ID:  Marissa Jefferson, DOB 1924-09-29, MRN [id number]  PCP:  Thressa Sheller, MD  Cardiologist:   Sanda Klein, MD   Chief Complaint  Patient presents with  . 6 MONTHS  . Edema    History of Present Illness:  Marissa Jefferson is a 82 y.o. female with CAD and diastolic heart failure. almost 2 years have passed since her coronary artery bypass surgery.she is doing better. Preop EF was 20-25%, last EF one year ago was 55%. She continues to live independently, in a second story apartment, going up and down stairs daily. She has had very infrequent problems with fluid overload and no longer takes a loop diuretic. She denies angina smear with activity. She also denies dizziness and syncope. No palpitations.  She had paroxysmal atrial fibrillation in the immediate postop period without clinical recurrence. She takes aspirin, but does not take anticoagulants or antiarrhythmics  Other than a beta blocker.  She has managed to gain some weight and is no longer formally underweight, although she is still quite slender, with a BMI of 21.  Past Medical History  Diagnosis Date  . DM2 (diabetes mellitus, type 2) ([place])   . Hypertension   . Hyperlipidemia   . GERD (gastroesophageal reflux disease)   . Osteopenia   . CAD (coronary artery disease), native coronary artery 10/06/2013    LAD 90%, CFX 90%, RCA 100% w/ collat  . Ischemic dilated cardiomyopathy 09/2013    EF 20-25% by echo  . Chronic combined systolic and diastolic CHF, NYHA class 2 (Marissa Jefferson) 09/2013  . NSVT (nonsustained ventricular tachycardia) ([place]) 09/2013  . OAB (overactive bladder)   . Hypothyroidism   . CVA (cerebral vascular accident) (Marissa Jefferson) 1968    leaving no deficit  . Arthritis     "knees" (10/08/2013)  . Breast cancer ([facility])     "left; I had 62 sessions of radiation"  . 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 Colonoscopy  2005  . Cardiac catheterization  10/06/2013  . Cataract extraction w/ intraocular lens  implant, bilateral Bilateral ~ 2009  . Tonsillectomy and adenoidectomy  1930's  . Breast lumpectomy Left 1998  . Breast lumpectomy with axillary lymph node dissection Left 1998  . Coronary artery bypass graft [name] 10/09/2013    Procedure: CORONARY ARTERY BYPASS GRAFTING (CABG) x 4 using left internal mammary artery and right saphenous leg vein using endoscope.;  Surgeon: Melrose Nakayama, MD;  Location: [facility];  Service: Open Heart Surgery;  Laterality: [name];  . Intraoperative transesophageal echocardiogram [name] 10/09/2013    Procedure: INTRAOPERATIVE TRANSESOPHAGEAL ECHOCARDIOGRAM;  Surgeon: Melrose Nakayama, MD;  Location: [facility];  Service: Open Heart Surgery;  Laterality: [name];  . Left and right heart catheterization with coronary angiogram [name] 10/06/2013    Procedure: LEFT AND RIGHT HEART CATHETERIZATION WITH CORONARY ANGIOGRAM;  Surgeon: Sanda Klein, MD;  Location: [facility];  Service: Cardiovascular;  Laterality: [name];    Current Medications: Outpatient Prescriptions Prior to Visit  Medication Sig Dispense Refill  . acetaminophen (TYLENOL) 500 MG tablet Take 1,000 mg by mouth 2 (two) times daily.    Marland Kitchen aspirin EC 325 MG EC tablet Take 1 tablet (325 mg total) by mouth daily. 30 tablet 0  . atorvastatin (LIPITOR) 80 MG tablet  Take 1 tablet by mouth every evening.     . Biotin 2500 MCG CAPS Take 1 capsule by mouth daily.    . cholecalciferol (VITAMIN D) 1000 UNITS tablet Take 1,000 Units by mouth daily.    Mariane Baumgarten Calcium (STOOL SOFTENER PO) Take 1 tablet by mouth daily as needed (CONSTIPATION).     . furosemide (LASIX) 20 MG tablet Take 20 mg by mouth daily as needed.    Marland Kitchen levothyroxine (SYNTHROID, LEVOTHROID) 150 MCG tablet Take 150 mcg by mouth daily.    Marland Kitchen lisinopril  (PRINIVIL,ZESTRIL) 10 MG tablet TAKE 1 TABLET BY MOUTH EVERY DAY 90 tablet 6  . Magnesium Oxide 400 (240 MG) MG TABS TAKE 1 TABLET TWICE DAILY. 60 tablet 9  . Multiple Vitamin (MULTIVITAMIN WITH MINERALS) TABS tablet Take 1 tablet by mouth daily.    . Nutritional Supplements (BOOST CALORIE SMART PO) Take 1 Can by mouth 2 (two) times daily.    . ondansetron (ZOFRAN) 4 MG tablet Take 1 tablet by mouth as needed for nausea or vomiting.     Marland Kitchen spironolactone-hydrochlorothiazide (ALDACTAZIDE) 25-25 MG tablet TAKE 1 TABLET BY MOUTH DAILY 90 tablet 0  . VOLTAREN 1 % GEL Apply 1 application topically daily as needed (knee pain).     . carvedilol (COREG) 12.5 MG tablet TAKE 1 TABLET BY MOUTH TWICE DAILY 180 tablet 1  . oxybutynin (DITROPAN XL) 15 MG 24 hr tablet Take 2 tablets by mouth at bedtime.      No facility-administered medications prior to visit.     Allergies:   Review of patient's allergies indicates no known allergies.   Social History   Social History  . Marital Status: Widowed    Spouse Name: [name]  . Number of Children: [name]  . Years of Education: [name]   Social History Main Topics  . Smoking status: Former Smoker -- 0.25 packs/day for 3 years    Types: Cigarettes    Quit date: 03/12/1948  . Smokeless tobacco: Never Used     Comment: smoked in college   . Alcohol Use: No  . Drug Use: No  . Sexual Activity: Not Currently   Other Topics Concern  . None   Social History Narrative     Family History:  The patient's family history includes Asthma in her daughter and son; Cancer in her father; Colon cancer in her mother; Heart attack in her father.   ROS:   Please see the history of present illness.    ROS All other systems reviewed and are negative.   PHYSICAL EXAM:   VS:  BP 168/72 mmHg  Pulse 77  Ht 5\' 6"  (1.676 m)  Wt 60.328 kg (133 lb)  BMI 21.48 kg/m2   GEN: Well nourished, well developed, in no acute distress HEENT: normal Neck: no JVD, carotid bruits, or  masses Cardiac: RRR with frequent ectopy; no murmurs, rubs, or gallops,no edema  Respiratory:  clear to auscultation bilaterally, normal work of breathing GI: soft, nontender, nondistended, + BS MS: no deformity or atrophy Skin: warm and dry, no rash Neuro:  Alert and Oriented x 3, Strength and sensation are intact Psych: euthymic mood, full affect  Wt Readings from Last 3 Encounters:  07/04/15 60.328 kg (133 lb)  03/29/15 52.889 kg (116 lb 9.6 oz)  02/25/15 58.514 kg (129 lb)      Studies/Labs Reviewed:   EKG:  EKG is ordered today.  The ekg ordered today demonstrates sinus rhythm with RVOT morphology PVCs in quadrigeminy  Recent Labs: 02/25/2015: ALT 46*; TSH 2.44 03/29/2015: BUN 52*; Creatinine, Ser 0.90; Potassium 4.9; Pro B Natriuretic peptide (BNP) 316.0*; Sodium 136   Lipid Panel    Component Value Date/Time   CHOL 135 05/10/2014 1440   TRIG 138 05/10/2014 1440   HDL 47 05/10/2014 1440   CHOLHDL 2.9 05/10/2014 1440   VLDL 28 05/10/2014 1440   LDLCALC 60 05/10/2014 1440    Additional studies/ records that were reviewed today include:  Notes from Dr. Melvyn Novas    ASSESSMENT:    1. Chronic combined systolic and diastolic CHF (congestive heart failure) (H[facility])      PLAN:  In order of problems listed above:  1. Chronic diastolic heart failure. She does not have any signs or symptoms of left heart failure, without need for loop diuretic. Seems to be euvolemic at current weight.  2. CAD s/p CABG July 2015. Following bypass surgery she had a remarkable normalization of left ventricle systolic function, which previously had been as low as an EF of 25%. Continue carvedilol, aspirin, statin, ACE inhibitor. She does not have angina pectoris. Excellent lipid profile on high-dose statin.  4. Diabetes mellitus type 2, controlled with diet, possibly "cured" following weight loss (A1c 5.5% one year ago, glucose 127 January 2017)  5. Essential hypertension with inadequate control.  She reports similar blood pressure  With home monitor. Will increase beta blocker, as this may also help reduce PVCs.    Medication Adjustments/Labs and Tests Ordered: Current medicines are reviewed at length with the patient today.  Concerns regarding medicines are outlined above.  Medication changes, Labs and Tests ordered today are listed in the Patient Instructions below. Patient Instructions  Dr Sallyanne Kuster has recommended making the following medication changes: 1. INCREASE Carvedilol (Coreg) to 25 mg twice daily  Dr Sallyanne Kuster recommends that you schedule a follow-up appointment in 6 months. You will receive a reminder letter in the mail two months in advance. If you don't receive a letter, please call our office to schedule the follow-up appointment.  If you need a refill on your cardiac medications before your next appointment, please call your pharmacy.    Mikael Spray, MD  07/05/2015 6:09 PM    [facility] [place], [place], [place]  [address] Phone: [phone]; Fax: [phone]

## 2015-07-04 NOTE — Patient Instructions (Signed)
Dr Sallyanne Kuster has recommended making the following medication changes: 1. INCREASE Carvedilol (Coreg) to 25 mg twice daily  Dr Sallyanne Kuster recommends that you schedule a follow-up appointment in 6 months. You will receive a reminder letter in the mail two months in advance. If you don't receive a letter, please call our office to schedule the follow-up appointment.  If you need a refill on your cardiac medications before your next appointment, please call your pharmacy.

## 2015-07-05 ENCOUNTER — Encounter: Payer: Self-pay | Admitting: Cardiovascular Disease

## 2015-10-27 DIAGNOSIS — E1122 Type 2 diabetes mellitus with diabetic chronic kidney disease: Secondary | ICD-10-CM | POA: Diagnosis not present

## 2015-10-27 DIAGNOSIS — E559 Vitamin D deficiency, unspecified: Secondary | ICD-10-CM | POA: Diagnosis not present

## 2015-10-27 DIAGNOSIS — M810 Age-related osteoporosis without current pathological fracture: Secondary | ICD-10-CM | POA: Diagnosis not present

## 2015-10-27 DIAGNOSIS — Z1389 Encounter for screening for other disorder: Secondary | ICD-10-CM | POA: Diagnosis not present

## 2015-10-27 DIAGNOSIS — Z0000 Encounter for general adult medical examination without abnormal findings: Secondary | ICD-10-CM | POA: Diagnosis not present

## 2015-10-27 DIAGNOSIS — I10 Essential (primary) hypertension: Secondary | ICD-10-CM | POA: Diagnosis not present

## 2015-10-27 DIAGNOSIS — I129 Hypertensive chronic kidney disease with stage 1 through stage 4 chronic kidney disease, or unspecified chronic kidney disease: Secondary | ICD-10-CM | POA: Diagnosis not present

## 2015-10-27 DIAGNOSIS — Z23 Encounter for immunization: Secondary | ICD-10-CM | POA: Diagnosis not present

## 2015-11-03 DIAGNOSIS — Z23 Encounter for immunization: Secondary | ICD-10-CM | POA: Diagnosis not present

## 2015-11-03 DIAGNOSIS — R69 Illness, unspecified: Secondary | ICD-10-CM | POA: Diagnosis not present

## 2015-11-03 DIAGNOSIS — E1122 Type 2 diabetes mellitus with diabetic chronic kidney disease: Secondary | ICD-10-CM | POA: Diagnosis not present

## 2015-11-03 DIAGNOSIS — I5022 Chronic systolic (congestive) heart failure: Secondary | ICD-10-CM | POA: Diagnosis not present

## 2015-11-03 DIAGNOSIS — I129 Hypertensive chronic kidney disease with stage 1 through stage 4 chronic kidney disease, or unspecified chronic kidney disease: Secondary | ICD-10-CM | POA: Diagnosis not present

## 2015-11-23 DIAGNOSIS — L820 Inflamed seborrheic keratosis: Secondary | ICD-10-CM | POA: Diagnosis not present

## 2015-11-23 DIAGNOSIS — L821 Other seborrheic keratosis: Secondary | ICD-10-CM | POA: Diagnosis not present

## 2016-01-04 ENCOUNTER — Encounter: Payer: Self-pay | Admitting: Cardiovascular Disease

## 2016-01-04 ENCOUNTER — Ambulatory Visit (INDEPENDENT_AMBULATORY_CARE_PROVIDER_SITE_OTHER): Payer: Medicare HMO | Admitting: Cardiovascular Disease

## 2016-01-04 VITALS — BP 140/88 | HR 57 | Ht 66.0 in | Wt 143.2 lb

## 2016-01-04 DIAGNOSIS — I5032 Chronic diastolic (congestive) heart failure: Secondary | ICD-10-CM

## 2016-01-04 DIAGNOSIS — K5900 Constipation, unspecified: Secondary | ICD-10-CM

## 2016-01-04 DIAGNOSIS — I2510 Atherosclerotic heart disease of native coronary artery without angina pectoris: Secondary | ICD-10-CM | POA: Diagnosis not present

## 2016-01-04 DIAGNOSIS — I10 Essential (primary) hypertension: Secondary | ICD-10-CM | POA: Diagnosis not present

## 2016-01-04 DIAGNOSIS — I493 Ventricular premature depolarization: Secondary | ICD-10-CM | POA: Diagnosis not present

## 2016-01-04 NOTE — Patient Instructions (Signed)
Dr Croitoru recommends that you schedule a follow-up appointment in 1 year. You will receive a reminder letter in the mail two months in advance. If you don't receive a letter, please call our office to schedule the follow-up appointment.  If you need a refill on your cardiac medications before your next appointment, please call your pharmacy. 

## 2016-01-04 NOTE — Progress Notes (Signed)
Patient ID: Marissa Jefferson, female   DOB: 1925-02-12, 82 y.o.   MRN: [id number]    Cardiology Office Note    Date:  01/04/2016   ID:  Marissa Jefferson, DOB Dec 05, 1924, MRN [id number]  PCP:  Thressa Sheller, MD  Cardiologist:   Sanda Klein, MD   Chief Complaint  Patient presents with  . Follow-up    History of Present Illness:  Marissa Jefferson is a 82 y.o. female with CAD and diastolic heart failure. Over 2 years have passed since her coronary artery bypass surgery.she is doing better. Preop EF was 20-25%, recovered to 55%. She continues to live independently, going up and down stairs daily. She has not had edema, no longer takes a loop diuretic. She denies angina or dyspnea with activity. She also denies dizziness and syncope. No palpitations, although she has frequent ectopy on exam and by ECG. Blood pressure has improved after the adjustments we made at her last appointment.  Her only complaint today is that of constipation which associates discomfort in her right flank.  She had paroxysmal atrial fibrillation in the immediate postop period without clinical recurrence. She takes aspirin, but does not take anticoagulants or antiarrhythmics other than a beta blocker.   Past Medical History:  Diagnosis Date  . Arthritis    "knees" (10/08/2013)  . Breast cancer ([place])    "left; I had 62 sessions of radiation"  . CAD (coronary artery disease), native coronary artery 10/06/2013   LAD 90%, CFX 90%, RCA 100% w/ collat  . Chronic combined systolic and diastolic CHF, NYHA class 2 ([place]) 09/2013  . CVA (cerebral vascular accident) ([place]) 1968   leaving no deficit  . DM2 (diabetes mellitus, type 2) ([place])   . GERD (gastroesophageal reflux disease)   . Hyperlipidemia   . Hypertension   . Hypothyroidism   . Ischemic dilated cardiomyopathy ([place]) 09/2013   EF 20-25% by echo  . NSVT (nonsustained ventricular tachycardia) ([place]) 09/2013  . OAB (overactive bladder)   . Osteopenia   . 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 BREAST LUMPECTOMY Left 1998  . BREAST LUMPECTOMY WITH AXILLARY LYMPH NODE DISSECTION Left 1998  . CARDIAC CATHETERIZATION  10/06/2013  . CATARACT EXTRACTION W/ INTRAOCULAR LENS  IMPLANT, BILATERAL Bilateral ~ 2009  . COLONOSCOPY  2005  . CORONARY ARTERY BYPASS GRAFT [name] 10/09/2013   Procedure: CORONARY ARTERY BYPASS GRAFTING (CABG) x 4 using left internal mammary artery and right saphenous leg vein using endoscope.;  Surgeon: Melrose Nakayama, MD;  Location: [facility];  Service: Open Heart Surgery;  Laterality: [name];  . INTRAOPERATIVE TRANSESOPHAGEAL ECHOCARDIOGRAM [name] 10/09/2013   Procedure: INTRAOPERATIVE TRANSESOPHAGEAL ECHOCARDIOGRAM;  Surgeon: Melrose Nakayama, MD;  Location: [facility];  Service: Open Heart Surgery;  Laterality: [name];  . LEFT AND RIGHT HEART CATHETERIZATION WITH CORONARY ANGIOGRAM [name] 10/06/2013   Procedure: LEFT AND RIGHT HEART CATHETERIZATION WITH CORONARY ANGIOGRAM;  Surgeon: Sanda Klein, MD;  Location: [facility] CATH LAB;  Service: Cardiovascular;  Laterality: [name];  . TONSILLECTOMY AND ADENOIDECTOMY  1930's    Current Medications: Outpatient Medications Prior to Visit  Medication Sig Dispense Refill  . acetaminophen (TYLENOL) 500 MG tablet Take 1,000 mg by mouth 2 (two) times daily.    Marland Kitchen aspirin EC 325 MG EC tablet Take 1 tablet (325 mg total) by mouth daily. 30 tablet 0  . atorvastatin (LIPITOR) 80 MG tablet Take 1 tablet by mouth every evening.     Marland Kitchen  Biotin 2500 MCG CAPS Take 1 capsule by mouth daily.    . carvedilol (COREG) 25 MG tablet Take 1 tablet (25 mg total) by mouth 2 (two) times daily with a meal. 180 tablet 3  . cholecalciferol (VITAMIN D) 1000 UNITS tablet Take 1,000 Units by mouth daily.    Mariane Baumgarten Calcium (STOOL SOFTENER PO) Take 1 tablet by mouth daily as needed (CONSTIPATION).     . furosemide (LASIX) 20 MG tablet Take 20 mg by mouth daily  as needed.    Marland Kitchen levothyroxine (SYNTHROID, LEVOTHROID) 150 MCG tablet Take 150 mcg by mouth daily.    Marland Kitchen lisinopril (PRINIVIL,ZESTRIL) 10 MG tablet TAKE 1 TABLET BY MOUTH EVERY DAY 90 tablet 6  . Magnesium Oxide 400 (240 MG) MG TABS TAKE 1 TABLET TWICE DAILY. 60 tablet 9  . Multiple Vitamin (MULTIVITAMIN WITH MINERALS) TABS tablet Take 1 tablet by mouth daily.    . Nutritional Supplements (BOOST CALORIE SMART PO) Take 1 Can by mouth 2 (two) times daily.    . ondansetron (ZOFRAN) 4 MG tablet Take 1 tablet by mouth as needed for nausea or vomiting.     Marland Kitchen spironolactone-hydrochlorothiazide (ALDACTAZIDE) 25-25 MG tablet TAKE 1 TABLET BY MOUTH DAILY 90 tablet 0  . VOLTAREN 1 % GEL Apply 1 application topically daily as needed (knee pain).     Marland Kitchen oxybutynin (DITROPAN) 5 MG tablet Take 5 mg by mouth 2 (two) times daily.  5   No facility-administered medications prior to visit.      Allergies:   Review of patient's allergies indicates no known allergies.   Social History   Social History  . Marital status: Widowed    Spouse name: [name]  . Number of children: [name]  . Years of education: [name]   Social History Main Topics  . Smoking status: Former Smoker    Packs/day: 0.25    Years: 3.00    Types: Cigarettes    Quit date: 03/12/1948  . Smokeless tobacco: Never Used     Comment: smoked in college   . Alcohol use No  . Drug use: No  . Sexual activity: Not Currently   Other Topics Concern  . Not on file   Social History Narrative  . No narrative on file     Family History:  The patient's family history includes Asthma in her daughter and son; Cancer in her father; Colon cancer in her mother; Heart attack in her father.   ROS:   Please see the history of present illness.    ROS All other systems reviewed and are negative.   PHYSICAL EXAM:   VS:  BP 140/88 (BP Location: Right Arm, Patient Position: Sitting, Cuff Size: Normal)   Pulse (!) 57   Ht 5\' 6"  (1.676 m)   Wt 143 lb 3.2 oz (65 kg)    BMI 23.11 kg/m    GEN: Well nourished, well developed, in no acute distress  HEENT: normal  Neck: no JVD, carotid bruits, or masses Cardiac: RRR with occasional ectopy; no murmurs, rubs, or gallops,no edema  Respiratory:  clear to auscultation bilaterally, normal work of breathing GI: soft, nontender, nondistended, + BS MS: no deformity or atrophy  Skin: warm and dry, no rash Neuro:  Alert and Oriented x 3, Strength and sensation are intact Psych: euthymic mood, full affect  Wt Readings from Last 3 Encounters:  01/04/16 143 lb 3.2 oz (65 kg)  07/04/15 133 lb (60.3 kg)  03/29/15 116 lb 9.6 oz (52.9 kg)  Studies/Labs Reviewed:   EKG:  EKG is ordered today.  The ekg ordered today demonstrates Mild sinus bradycardia 57 bpm with QTC 408 ms. No PVCs are seen today although these are heard on exam.  Recent Labs: 02/25/2015: ALT 46; TSH 2.44 03/29/2015: BUN 52; Creatinine, Ser 0.90; Potassium 4.9; Pro B Natriuretic peptide (BNP) 316.0; Sodium 136   Lipid Panel    Component Value Date/Time   CHOL 135 05/10/2014 1440   TRIG 138 05/10/2014 1440   HDL 47 05/10/2014 1440   CHOLHDL 2.9 05/10/2014 1440   VLDL 28 05/10/2014 1440   LDLCALC 60 05/10/2014 1440     ASSESSMENT:    1. Chronic diastolic heart failure ([place])   2. Coronary artery disease involving native coronary artery of native heart without angina pectoris   3. Essential hypertension   4. 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 Constipation, unspecified constipation type      PLAN:  In order of problems listed above:  1. Chronic diastolic heart failure. She does not have any signs or symptoms of left heart failure, without need for loop diuretic. Seems to be euvolemic at current weight. 2. CAD s/p CABG July 2015. Following bypass surgery she had a remarkable normalization of left ventricle systolic function, which previously had been as low as an EF of 25%. Continue  carvedilol, aspirin, statin, ACE inhibitor. She does not have angina pectoris. Excellent lipid profile on high-dose statin. 3. Essential hypertension with adequate control.  4. PVCs (RVOT?): on beta blocker; no additional therapy needed at this time. 4. Constipation: Encouraged more consumption of fluids and increase physical activity.    Medication Adjustments/Labs and Tests Ordered: Current medicines are reviewed at length with the patient today.  Concerns regarding medicines are outlined above.  Medication changes, Labs and Tests ordered today are listed in the Patient Instructions below. There are no Patient Instructions on file for this visit.   Signed, Sanda Klein, MD  01/04/2016 11:29 AM    [address], [place], [place]  [address] Phone: [phone]; Fax: [phone]

## 2016-01-11 ENCOUNTER — Telehealth: Payer: Self-pay | Admitting: Internal Medicine

## 2016-01-11 ENCOUNTER — Telehealth: Payer: Self-pay | Admitting: *Deleted

## 2016-01-11 DIAGNOSIS — R918 Other nonspecific abnormal finding of lung field: Secondary | ICD-10-CM

## 2016-01-11 NOTE — Telephone Encounter (Signed)
-----   Message from Tanda Rockers, MD sent at 02/26/2015  8:57 AM EST -----  repeat s contrast 01/20/16 for mpns

## 2016-01-11 NOTE — Telephone Encounter (Signed)
Will hold message until tomorrow per pt request.

## 2016-01-11 NOTE — Telephone Encounter (Signed)
Spoke with the pt and notified that she is due for ct chest  Order was sent to [facility]

## 2016-01-13 NOTE — Telephone Encounter (Signed)
Called and spoke to pt. Pt is scheduled for CT chest for 11.15.17 for a one year f/u. Pt states she does not feel this is necessary and would like the CT chest cancelled. CT chest cancelled.   Will send to [facility] as Juluis Rainier.

## 2016-01-16 NOTE — Telephone Encounter (Signed)
Chart reviewed, agree no need for ct chest, can f/u prn symptoms, copy this phone call to [facility]

## 2016-01-16 NOTE — Telephone Encounter (Signed)
Pt aware of below message. Pt voiced understanding and had no further questions. Nothing further needed. 

## 2016-01-25 ENCOUNTER — Inpatient Hospital Stay: Admission: RE | Admit: 2016-01-25 | Payer: Medicare HMO | Source: Ambulatory Visit

## 2016-01-30 DIAGNOSIS — Z961 Presence of intraocular lens: Secondary | ICD-10-CM | POA: Diagnosis not present

## 2016-01-30 DIAGNOSIS — H43813 Vitreous degeneration, bilateral: Secondary | ICD-10-CM | POA: Diagnosis not present

## 2016-01-30 DIAGNOSIS — H40013 Open angle with borderline findings, low risk, bilateral: Secondary | ICD-10-CM | POA: Diagnosis not present

## 2016-01-30 DIAGNOSIS — H02834 Dermatochalasis of left upper eyelid: Secondary | ICD-10-CM | POA: Diagnosis not present

## 2016-01-30 DIAGNOSIS — H02831 Dermatochalasis of right upper eyelid: Secondary | ICD-10-CM | POA: Diagnosis not present

## 2016-01-30 DIAGNOSIS — H1851 Endothelial corneal dystrophy: Secondary | ICD-10-CM | POA: Diagnosis not present

## 2016-04-02 DIAGNOSIS — E785 Hyperlipidemia, unspecified: Secondary | ICD-10-CM | POA: Diagnosis not present

## 2016-04-02 DIAGNOSIS — N3281 Overactive bladder: Secondary | ICD-10-CM | POA: Diagnosis not present

## 2016-04-02 DIAGNOSIS — L819 Disorder of pigmentation, unspecified: Secondary | ICD-10-CM | POA: Diagnosis not present

## 2016-04-02 DIAGNOSIS — M170 Bilateral primary osteoarthritis of knee: Secondary | ICD-10-CM | POA: Diagnosis not present

## 2016-04-02 DIAGNOSIS — Z79899 Other long term (current) drug therapy: Secondary | ICD-10-CM | POA: Diagnosis not present

## 2016-04-02 DIAGNOSIS — I10 Essential (primary) hypertension: Secondary | ICD-10-CM | POA: Diagnosis not present

## 2016-04-02 DIAGNOSIS — Z6824 Body mass index (BMI) 24.0-24.9, adult: Secondary | ICD-10-CM | POA: Diagnosis not present

## 2016-04-02 DIAGNOSIS — E119 Type 2 diabetes mellitus without complications: Secondary | ICD-10-CM | POA: Diagnosis not present

## 2016-04-02 DIAGNOSIS — Z8679 Personal history of other diseases of the circulatory system: Secondary | ICD-10-CM | POA: Diagnosis not present

## 2016-04-02 DIAGNOSIS — E039 Hypothyroidism, unspecified: Secondary | ICD-10-CM | POA: Diagnosis not present

## 2016-04-02 DIAGNOSIS — M259 Joint disorder, unspecified: Secondary | ICD-10-CM | POA: Diagnosis not present

## 2016-04-02 DIAGNOSIS — Z0000 Encounter for general adult medical examination without abnormal findings: Secondary | ICD-10-CM | POA: Diagnosis not present

## 2016-04-02 DIAGNOSIS — Z87448 Personal history of other diseases of urinary system: Secondary | ICD-10-CM | POA: Diagnosis not present

## 2016-05-01 DIAGNOSIS — E559 Vitamin D deficiency, unspecified: Secondary | ICD-10-CM | POA: Diagnosis not present

## 2016-05-01 DIAGNOSIS — E1122 Type 2 diabetes mellitus with diabetic chronic kidney disease: Secondary | ICD-10-CM | POA: Diagnosis not present

## 2016-05-01 DIAGNOSIS — I129 Hypertensive chronic kidney disease with stage 1 through stage 4 chronic kidney disease, or unspecified chronic kidney disease: Secondary | ICD-10-CM | POA: Diagnosis not present

## 2016-05-01 DIAGNOSIS — M810 Age-related osteoporosis without current pathological fracture: Secondary | ICD-10-CM | POA: Diagnosis not present

## 2016-05-08 DIAGNOSIS — M810 Age-related osteoporosis without current pathological fracture: Secondary | ICD-10-CM | POA: Diagnosis not present

## 2016-05-08 DIAGNOSIS — I2510 Atherosclerotic heart disease of native coronary artery without angina pectoris: Secondary | ICD-10-CM | POA: Diagnosis not present

## 2016-05-08 DIAGNOSIS — I10 Essential (primary) hypertension: Secondary | ICD-10-CM | POA: Diagnosis not present

## 2016-05-08 DIAGNOSIS — E7800 Pure hypercholesterolemia, unspecified: Secondary | ICD-10-CM | POA: Diagnosis not present

## 2016-06-05 ENCOUNTER — Other Ambulatory Visit: Payer: Self-pay | Admitting: *Deleted

## 2016-06-05 MED ORDER — LISINOPRIL 10 MG PO TABS: 10.0000 mg | ORAL_TABLET | Freq: Every day | ORAL | 1 refills | Status: DC

## 2016-06-05 NOTE — Telephone Encounter (Signed)
Rx(s) sent to pharmacy electronically.  

## 2016-07-06 DIAGNOSIS — L814 Other melanin hyperpigmentation: Secondary | ICD-10-CM | POA: Diagnosis not present

## 2016-07-06 DIAGNOSIS — L821 Other seborrheic keratosis: Secondary | ICD-10-CM | POA: Diagnosis not present

## 2016-07-09 ENCOUNTER — Other Ambulatory Visit: Payer: Self-pay | Admitting: Cardiovascular Disease

## 2016-07-09 NOTE — Telephone Encounter (Signed)
Rx has been sent to the pharmacy electronically. ° °

## 2016-07-11 DIAGNOSIS — N3281 Overactive bladder: Secondary | ICD-10-CM | POA: Diagnosis not present

## 2016-11-01 DIAGNOSIS — M810 Age-related osteoporosis without current pathological fracture: Secondary | ICD-10-CM | POA: Diagnosis not present

## 2016-11-01 DIAGNOSIS — E1122 Type 2 diabetes mellitus with diabetic chronic kidney disease: Secondary | ICD-10-CM | POA: Diagnosis not present

## 2016-11-01 DIAGNOSIS — Z0000 Encounter for general adult medical examination without abnormal findings: Secondary | ICD-10-CM | POA: Diagnosis not present

## 2016-11-01 DIAGNOSIS — E039 Hypothyroidism, unspecified: Secondary | ICD-10-CM | POA: Diagnosis not present

## 2016-11-01 DIAGNOSIS — E785 Hyperlipidemia, unspecified: Secondary | ICD-10-CM | POA: Diagnosis not present

## 2016-11-01 DIAGNOSIS — I129 Hypertensive chronic kidney disease with stage 1 through stage 4 chronic kidney disease, or unspecified chronic kidney disease: Secondary | ICD-10-CM | POA: Diagnosis not present

## 2016-11-07 DIAGNOSIS — E119 Type 2 diabetes mellitus without complications: Secondary | ICD-10-CM | POA: Diagnosis not present

## 2016-11-07 DIAGNOSIS — E039 Hypothyroidism, unspecified: Secondary | ICD-10-CM | POA: Diagnosis not present

## 2016-11-07 DIAGNOSIS — E7800 Pure hypercholesterolemia, unspecified: Secondary | ICD-10-CM | POA: Diagnosis not present

## 2016-11-07 DIAGNOSIS — I2510 Atherosclerotic heart disease of native coronary artery without angina pectoris: Secondary | ICD-10-CM | POA: Diagnosis not present

## 2016-11-07 DIAGNOSIS — Z0000 Encounter for general adult medical examination without abnormal findings: Secondary | ICD-10-CM | POA: Diagnosis not present

## 2016-11-07 DIAGNOSIS — N390 Urinary tract infection, site not specified: Secondary | ICD-10-CM | POA: Diagnosis not present

## 2016-11-07 DIAGNOSIS — I10 Essential (primary) hypertension: Secondary | ICD-10-CM | POA: Diagnosis not present

## 2016-11-21 DIAGNOSIS — I10 Essential (primary) hypertension: Secondary | ICD-10-CM | POA: Diagnosis not present

## 2016-11-21 DIAGNOSIS — Z23 Encounter for immunization: Secondary | ICD-10-CM | POA: Diagnosis not present

## 2016-12-03 ENCOUNTER — Other Ambulatory Visit: Payer: Self-pay | Admitting: Cardiovascular Disease

## 2017-01-03 ENCOUNTER — Other Ambulatory Visit: Payer: Self-pay | Admitting: Cardiovascular Disease

## 2017-01-24 ENCOUNTER — Ambulatory Visit: Payer: Medicare HMO | Admitting: Cardiovascular Disease

## 2017-01-24 ENCOUNTER — Encounter: Payer: Self-pay | Admitting: Cardiovascular Disease

## 2017-01-24 VITALS — BP 180/86 | HR 66 | Ht 66.5 in | Wt 144.6 lb

## 2017-01-24 DIAGNOSIS — I493 Ventricular premature depolarization: Secondary | ICD-10-CM | POA: Diagnosis not present

## 2017-01-24 DIAGNOSIS — I10 Essential (primary) hypertension: Secondary | ICD-10-CM | POA: Diagnosis not present

## 2017-01-24 DIAGNOSIS — I480 Paroxysmal atrial fibrillation: Secondary | ICD-10-CM

## 2017-01-24 DIAGNOSIS — I5032 Chronic diastolic (congestive) heart failure: Secondary | ICD-10-CM

## 2017-01-24 DIAGNOSIS — E039 Hypothyroidism, unspecified: Secondary | ICD-10-CM

## 2017-01-24 DIAGNOSIS — I2510 Atherosclerotic heart disease of native coronary artery without angina pectoris: Secondary | ICD-10-CM

## 2017-01-24 MED ORDER — AMLODIPINE BESYLATE 10 MG PO TABS: 10.0000 mg | ORAL_TABLET | Freq: Every day | ORAL | 3 refills | Status: DC

## 2017-01-24 NOTE — Progress Notes (Signed)
Patient ID: Marissa Jefferson, female   DOB: 07-30-1924, 83 y.o.   MRN: [id number]    Cardiology Office Note    Date:  01/26/2017   ID:  Marissa Jefferson, DOB 1924-04-06, MRN [id number]  PCP:  Thressa Sheller, MD  Cardiologist:   Sanda Klein, MD   Chief Complaint  Patient presents with  . Follow-up    12 months;    History of Present Illness:  Marissa Jefferson is a 83 y.o. female with CAD and diastolic heart failure. Over 2 years have passed since her coronary artery bypass surgery.she is doing better. Preop EF was 20-25%, recovered to 55%. She continues to live independently. She had paroxysmal atrial fibrillation in the immediate postop period without clinical recurrence. She takes aspirin, but does not take anticoagulants or antiarrhythmics other than a beta blocker.  She denies any problems with exertional angina or dyspnea and has not had dizziness, palpitations, syncope, leg edema or claudication.  Her blood pressure is quite high.  At some point her spironolactone-hydrochlorothiazide was discontinued, I am not sure if this was an inadvertent event or purposeful due to side effects or lab abnormalities.  She does not recall having any side effects.  She is now taking metformin 500 mg twice daily for type 2 diabetes mellitus  Past Medical History:  Diagnosis Date  . Arthritis    "knees" (10/08/2013)  . Breast cancer ([place])    "left; I had 62 sessions of radiation"  . CAD (coronary artery disease), native coronary artery 10/06/2013   LAD 90%, CFX 90%, RCA 100% w/ collat  . Chronic combined systolic and diastolic CHF, NYHA class 2 ([place]) 09/2013  . CVA (cerebral vascular accident) ([place]) 1968   leaving no deficit  . DM2 (diabetes mellitus, type 2) ([place])   . GERD (gastroesophageal reflux disease)   . Hyperlipidemia   . Hypertension   . Hypothyroidism   . Ischemic dilated cardiomyopathy ([place]) 09/2013   EF 20-25% by echo  . NSVT (nonsustained ventricular tachycardia) ([place]) 09/2013  . OAB  (overactive bladder)   . Osteopenia   . 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 BREAST LUMPECTOMY Left 1998  . BREAST LUMPECTOMY WITH AXILLARY LYMPH NODE DISSECTION Left 1998  . CARDIAC CATHETERIZATION  10/06/2013  . CATARACT EXTRACTION W/ INTRAOCULAR LENS  IMPLANT, BILATERAL Bilateral ~ 2009  . COLONOSCOPY  2005  . CORONARY ARTERY BYPASS GRAFTING (CABG) x 4 using left internal mammary artery and right saphenous leg vein using endoscope. [name] 10/09/2013   Performed by Melrose Nakayama, MD at [facility]  . INTRAOPERATIVE TRANSESOPHAGEAL ECHOCARDIOGRAM [name] 10/09/2013   Performed by Melrose Nakayama, MD at [facility]  . LEFT AND RIGHT HEART CATHETERIZATION WITH CORONARY ANGIOGRAM [name] 10/06/2013   Performed by Sanda Klein, MD at [facility] CATH LAB  . TONSILLECTOMY AND ADENOIDECTOMY  1930's    Current Medications: Outpatient Medications Prior to Visit  Medication Sig Dispense Refill  . acetaminophen (TYLENOL) 500 MG tablet Take 1,000 mg by mouth 2 (two) times daily.    Marland Kitchen aspirin EC 325 MG EC tablet Take 1 tablet (325 mg total) by mouth daily. 30 tablet 0  . atorvastatin (LIPITOR) 80 MG tablet Take 1 tablet by mouth every evening.     . Biotin 2500 MCG CAPS Take 1 capsule by mouth daily.    . carvedilol (COREG) 25 MG tablet TAKE 1 TABLET BY MOUTH TWICE A DAY WITH  A MEAL 180 tablet 0  . cholecalciferol (VITAMIN D) 1000 UNITS tablet Take 1,000 Units by mouth daily.    Marissa Jefferson Calcium (STOOL SOFTENER PO) Take 1 tablet by mouth daily as needed (CONSTIPATION).     . furosemide (LASIX) 20 MG tablet Take 20 mg by mouth daily as needed.    Marland Kitchen levothyroxine (SYNTHROID, LEVOTHROID) 150 MCG tablet Take 150 mcg by mouth daily.    Marland Kitchen lisinopril (PRINIVIL,ZESTRIL) 10 MG tablet TAKE 1 TABLET BY MOUTH EVERY DAY 90 tablet 0  . Magnesium Oxide 400 (240 MG) MG TABS TAKE 1 TABLET TWICE DAILY. 60 tablet 9  .  metFORMIN (GLUCOPHAGE) 500 MG tablet Take 2 (two) times daily with a meal by mouth.    . Multiple Vitamin (MULTIVITAMIN WITH MINERALS) TABS tablet Take 1 tablet by mouth daily.    . Nutritional Supplements (BOOST CALORIE SMART PO) Take 1 Can by mouth 2 (two) times daily.    . ondansetron (ZOFRAN) 4 MG tablet Take 1 tablet by mouth as needed for nausea or vomiting.     . TOVIAZ 4 MG TB24 tablet Take 4 mg 2 (two) times daily by mouth.  3  . VOLTAREN 1 % GEL Apply 1 application topically daily as needed (knee pain).     Marland Kitchen amLODipine (NORVASC) 2.5 MG tablet Take 2.5 mg daily by mouth.  3  . spironolactone-hydrochlorothiazide (ALDACTAZIDE) 25-25 MG tablet TAKE 1 TABLET BY MOUTH DAILY 90 tablet 0  . oxybutynin (DITROPAN XL) 15 MG 24 hr tablet Take 15 mg by mouth at bedtime.  1   No facility-administered medications prior to visit.      Allergies:   Patient has no known allergies.   Social History   Socioeconomic History  . Marital status: Widowed    Spouse name: None  . Number of children: None  . Years of education: None  . Highest education level: None  Social Needs  . Financial resource strain: None  . Food insecurity - worry: None  . Food insecurity - inability: None  . Transportation needs - medical: None  . Transportation needs - non-medical: None  Occupational History  . None  Tobacco Use  . Smoking status: Former Smoker    Packs/day: 0.25    Years: 3.00    Pack years: 0.75    Types: Cigarettes    Last attempt to quit: 03/12/1948    Years since quitting: 68.9  . Smokeless tobacco: Never Used  . Tobacco comment: smoked in college   Substance and Sexual Activity  . Alcohol use: No    Alcohol/week: 0.0 oz  . Drug use: No  . Sexual activity: Not Currently  Other Topics Concern  . None  Social History Narrative  . None     Family History:  The patient's family history includes Asthma in her daughter and son; Cancer in her father; Colon cancer in her mother; Heart attack  in her father.   ROS:   Please see the history of present illness.    ROS All other systems reviewed and are negative.   PHYSICAL EXAM:   VS:  BP (!) 180/86   Pulse 66   Ht 5' 6.5" (1.689 m)   Wt 144 lb 9.6 oz (65.6 kg)   BMI 22.99 kg/m   General: Alert, oriented x3, no distress, very lean, does not look her age Head: no evidence of trauma, PERRL, EOMI, no exophtalmos or lid lag, no myxedema, no xanthelasma; normal ears, nose and oropharynx Neck:  normal jugular venous pulsations and no hepatojugular reflux; brisk carotid pulses without delay and no carotid bruits Chest: clear to auscultation, no signs of consolidation by percussion or palpation, normal fremitus, symmetrical and full respiratory excursions Cardiovascular: normal position and quality of the apical impulse, regular rhythm, normal first and second heart sounds, no murmurs, rubs or gallops Abdomen: no tenderness or distention, no masses by palpation, no abnormal pulsatility or arterial bruits, normal bowel sounds, no hepatosplenomegaly Extremities: no clubbing, cyanosis or edema; 2+ radial, ulnar and brachial pulses bilaterally; 2+ right femoral, posterior tibial and dorsalis pedis pulses; 2+ left femoral, posterior tibial and dorsalis pedis pulses; no subclavian or femoral bruits Neurological: grossly nonfocal Psych: Normal mood and affect   Wt Readings from Last 3 Encounters:  01/24/17 144 lb 9.6 oz (65.6 kg)  01/04/16 143 lb 3.2 oz (65 kg)  07/04/15 133 lb (60.3 kg)      Studies/Labs Reviewed:   EKG:  EKG is ordered today.  The ekg ordered today demonstrates Mild sinus bradycardia 57 bpm with QTC 408 ms. No PVCs are seen today although these are heard on exam.  Recent Labs: November 01, 2016 hemoglobin 15.2, glucose 167, hemoglobin A1c 7.7%, 1 7, creatinine 0.8, normal liver function tests, TSH 0.23 (mildly suppressed)   Lipid Panel    Component Value Date/Time   CHOL 135 05/10/2014 1440   TRIG 138 05/10/2014  1440   HDL 47 05/10/2014 1440   CHOLHDL 2.9 05/10/2014 1440   VLDL 28 05/10/2014 1440   LDLCALC 60 05/10/2014 1440   November 01, 2016 Total cholesterol 126, triglycerides 101, HDL 44, LDL 62   ASSESSMENT:    1. Chronic diastolic heart failure ([place])   2. Coronary artery disease involving native coronary artery of native heart without angina pectoris   3. Essential hypertension   4. Paroxysmal atrial fibrillation ([facility])   5. 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 Acquired hypothyroidism      PLAN:  In order of problems listed above:  1. Chronic diastolic heart failure.  Even though she is no longer taking the thiazide/spironolactone combination she does not have any signs of fluid overload.  She has not taking the "as needed" furosemide in about 3 months.  She is very careful with sodium restriction.  She is no longer weighing herself daily. 2. CAD s/p CABG July 2015.  She has not had angina pectoris since her bypass procedure. 3. Essential hypertension now poorly controlled after discontinuation of her diuretics.  Since then they have been stopped for a particular reason, I have recommended that she increase her dose of amlodipine to 10 mg once daily.  Call back with blood pressure readings in a couple of weeks.  Call sooner if she develops leg edema. 4. Postop AFib has not recurred since surgery; not on anticoagulation  5. PVCs (RVOT?):  Not currently an issue 6.  Hypothyroidism on hormone supplement: TSH is mildly suppressed and has been for the last year or so, encouraged her to discuss reduction in levothyroxine dose with her primary care provider.     Medication Adjustments/Labs and Tests Ordered: Current medicines are reviewed at length with the patient today.  Concerns regarding medicines are outlined above.  Medication changes, Labs and Tests ordered today are listed in the Patient Instructions below. Patient Instructions  Dr Sallyanne Kuster  has recommended making the following medication changes: 1. INCREASE Amlodipine to 10 mg daily  Your physician recommends that you schedule a follow-up appointment  in 12 months. You will receive a reminder letter in the mail two months in advance. If you don't receive a letter, please call our office to schedule the follow-up appointment.  If you need a refill on your cardiac medications before your next appointment, please call your pharmacy.    Signed, Sanda Klein, MD  01/26/2017 3:54 PM    [address], [place], [place]  [address] Phone: [phone]; Fax: [phone]

## 2017-01-24 NOTE — Patient Instructions (Signed)
Dr Sallyanne Kuster has recommended making the following medication changes: 1. INCREASE Amlodipine to 10 mg daily  Your physician recommends that you schedule a follow-up appointment in 12 months. You will receive a reminder letter in the mail two months in advance. If you don't receive a letter, please call our office to schedule the follow-up appointment.  If you need a refill on your cardiac medications before your next appointment, please call your pharmacy.

## 2017-01-25 MED ORDER — AMLODIPINE BESYLATE 10 MG PO TABS: 10.0000 mg | ORAL_TABLET | Freq: Every day | ORAL | 3 refills | Status: DC

## 2017-01-25 NOTE — Telephone Encounter (Signed)
Pt said her Amlodipine was called to the wrong pharmacy yesterday. It should have been [facility] to [phone] please.

## 2017-01-25 NOTE — Telephone Encounter (Signed)
Rx(s) sent to pharmacy electronically.  

## 2017-03-02 ENCOUNTER — Other Ambulatory Visit: Payer: Self-pay | Admitting: Cardiovascular Disease

## 2017-04-04 ENCOUNTER — Other Ambulatory Visit: Payer: Self-pay

## 2017-04-04 MED ORDER — CARVEDILOL 25 MG PO TABS: ORAL_TABLET | ORAL | 0 refills | Status: DC

## 2017-04-08 DIAGNOSIS — I5022 Chronic systolic (congestive) heart failure: Secondary | ICD-10-CM | POA: Diagnosis not present

## 2017-04-08 DIAGNOSIS — E1122 Type 2 diabetes mellitus with diabetic chronic kidney disease: Secondary | ICD-10-CM | POA: Diagnosis not present

## 2017-04-08 DIAGNOSIS — N183 Chronic kidney disease, stage 3 (moderate): Secondary | ICD-10-CM | POA: Diagnosis not present

## 2017-04-08 DIAGNOSIS — N3281 Overactive bladder: Secondary | ICD-10-CM | POA: Diagnosis not present

## 2017-04-13 ENCOUNTER — Other Ambulatory Visit: Payer: Self-pay | Admitting: Cardiovascular Disease

## 2017-04-15 NOTE — Telephone Encounter (Signed)
Rx request sent to pharmacy.  

## 2017-05-28 DIAGNOSIS — E785 Hyperlipidemia, unspecified: Secondary | ICD-10-CM | POA: Diagnosis not present

## 2017-05-28 DIAGNOSIS — N183 Chronic kidney disease, stage 3 (moderate): Secondary | ICD-10-CM | POA: Diagnosis not present

## 2017-05-28 DIAGNOSIS — E1122 Type 2 diabetes mellitus with diabetic chronic kidney disease: Secondary | ICD-10-CM | POA: Diagnosis not present

## 2017-05-28 DIAGNOSIS — I5022 Chronic systolic (congestive) heart failure: Secondary | ICD-10-CM | POA: Diagnosis not present

## 2017-06-03 DIAGNOSIS — I129 Hypertensive chronic kidney disease with stage 1 through stage 4 chronic kidney disease, or unspecified chronic kidney disease: Secondary | ICD-10-CM | POA: Diagnosis not present

## 2017-06-03 DIAGNOSIS — I5022 Chronic systolic (congestive) heart failure: Secondary | ICD-10-CM | POA: Diagnosis not present

## 2017-06-03 DIAGNOSIS — E1122 Type 2 diabetes mellitus with diabetic chronic kidney disease: Secondary | ICD-10-CM | POA: Diagnosis not present

## 2017-06-03 DIAGNOSIS — N183 Chronic kidney disease, stage 3 (moderate): Secondary | ICD-10-CM | POA: Diagnosis not present

## 2017-06-03 DIAGNOSIS — N3281 Overactive bladder: Secondary | ICD-10-CM | POA: Diagnosis not present

## 2017-06-03 DIAGNOSIS — I2510 Atherosclerotic heart disease of native coronary artery without angina pectoris: Secondary | ICD-10-CM | POA: Diagnosis not present

## 2017-06-13 DIAGNOSIS — I2510 Atherosclerotic heart disease of native coronary artery without angina pectoris: Secondary | ICD-10-CM | POA: Diagnosis not present

## 2017-06-13 DIAGNOSIS — E039 Hypothyroidism, unspecified: Secondary | ICD-10-CM | POA: Diagnosis not present

## 2017-06-13 DIAGNOSIS — R69 Illness, unspecified: Secondary | ICD-10-CM | POA: Diagnosis not present

## 2017-06-13 DIAGNOSIS — E785 Hyperlipidemia, unspecified: Secondary | ICD-10-CM | POA: Diagnosis not present

## 2017-06-13 DIAGNOSIS — I110 Hypertensive heart disease with heart failure: Secondary | ICD-10-CM | POA: Diagnosis not present

## 2017-06-13 DIAGNOSIS — K219 Gastro-esophageal reflux disease without esophagitis: Secondary | ICD-10-CM | POA: Diagnosis not present

## 2017-06-13 DIAGNOSIS — I509 Heart failure, unspecified: Secondary | ICD-10-CM | POA: Diagnosis not present

## 2017-06-13 DIAGNOSIS — I4891 Unspecified atrial fibrillation: Secondary | ICD-10-CM | POA: Diagnosis not present

## 2017-06-13 DIAGNOSIS — E1151 Type 2 diabetes mellitus with diabetic peripheral angiopathy without gangrene: Secondary | ICD-10-CM | POA: Diagnosis not present

## 2017-06-13 DIAGNOSIS — R32 Unspecified urinary incontinence: Secondary | ICD-10-CM | POA: Diagnosis not present

## 2017-06-14 DIAGNOSIS — L821 Other seborrheic keratosis: Secondary | ICD-10-CM | POA: Diagnosis not present

## 2017-06-19 DIAGNOSIS — Z961 Presence of intraocular lens: Secondary | ICD-10-CM | POA: Diagnosis not present

## 2017-06-19 DIAGNOSIS — H43813 Vitreous degeneration, bilateral: Secondary | ICD-10-CM | POA: Diagnosis not present

## 2017-06-19 DIAGNOSIS — H1851 Endothelial corneal dystrophy: Secondary | ICD-10-CM | POA: Diagnosis not present

## 2017-06-19 DIAGNOSIS — H02834 Dermatochalasis of left upper eyelid: Secondary | ICD-10-CM | POA: Diagnosis not present

## 2017-06-19 DIAGNOSIS — H40013 Open angle with borderline findings, low risk, bilateral: Secondary | ICD-10-CM | POA: Diagnosis not present

## 2017-06-19 DIAGNOSIS — H02831 Dermatochalasis of right upper eyelid: Secondary | ICD-10-CM | POA: Diagnosis not present

## 2017-10-09 ENCOUNTER — Other Ambulatory Visit: Payer: Self-pay | Admitting: Cardiovascular Disease

## 2017-11-13 DIAGNOSIS — I5022 Chronic systolic (congestive) heart failure: Secondary | ICD-10-CM | POA: Diagnosis not present

## 2017-11-13 DIAGNOSIS — Z23 Encounter for immunization: Secondary | ICD-10-CM | POA: Diagnosis not present

## 2017-11-13 DIAGNOSIS — N183 Chronic kidney disease, stage 3 (moderate): Secondary | ICD-10-CM | POA: Diagnosis not present

## 2017-11-13 DIAGNOSIS — E1122 Type 2 diabetes mellitus with diabetic chronic kidney disease: Secondary | ICD-10-CM | POA: Diagnosis not present

## 2017-11-13 DIAGNOSIS — M810 Age-related osteoporosis without current pathological fracture: Secondary | ICD-10-CM | POA: Diagnosis not present

## 2017-11-13 DIAGNOSIS — Z0000 Encounter for general adult medical examination without abnormal findings: Secondary | ICD-10-CM | POA: Diagnosis not present

## 2017-11-21 DIAGNOSIS — Z0000 Encounter for general adult medical examination without abnormal findings: Secondary | ICD-10-CM | POA: Diagnosis not present

## 2017-11-21 DIAGNOSIS — M810 Age-related osteoporosis without current pathological fracture: Secondary | ICD-10-CM | POA: Diagnosis not present

## 2017-11-21 DIAGNOSIS — I2510 Atherosclerotic heart disease of native coronary artery without angina pectoris: Secondary | ICD-10-CM | POA: Diagnosis not present

## 2017-11-21 DIAGNOSIS — E1165 Type 2 diabetes mellitus with hyperglycemia: Secondary | ICD-10-CM | POA: Diagnosis not present

## 2017-11-21 DIAGNOSIS — I129 Hypertensive chronic kidney disease with stage 1 through stage 4 chronic kidney disease, or unspecified chronic kidney disease: Secondary | ICD-10-CM | POA: Diagnosis not present

## 2017-11-21 DIAGNOSIS — Z780 Asymptomatic menopausal state: Secondary | ICD-10-CM | POA: Diagnosis not present

## 2017-11-21 DIAGNOSIS — E1122 Type 2 diabetes mellitus with diabetic chronic kidney disease: Secondary | ICD-10-CM | POA: Diagnosis not present

## 2017-11-21 DIAGNOSIS — I5022 Chronic systolic (congestive) heart failure: Secondary | ICD-10-CM | POA: Diagnosis not present

## 2017-11-21 DIAGNOSIS — E782 Mixed hyperlipidemia: Secondary | ICD-10-CM | POA: Diagnosis not present

## 2017-11-21 DIAGNOSIS — E039 Hypothyroidism, unspecified: Secondary | ICD-10-CM | POA: Diagnosis not present

## 2017-12-09 DIAGNOSIS — M810 Age-related osteoporosis without current pathological fracture: Secondary | ICD-10-CM | POA: Diagnosis not present

## 2018-01-01 ENCOUNTER — Other Ambulatory Visit: Payer: Self-pay | Admitting: Cardiovascular Disease

## 2018-01-03 ENCOUNTER — Telehealth: Payer: Self-pay | Admitting: Cardiovascular Disease

## 2018-01-03 MED ORDER — CARVEDILOL 25 MG PO TABS: ORAL_TABLET | ORAL | 1 refills | Status: DC

## 2018-01-03 NOTE — Telephone Encounter (Signed)
New message   Pt said her pharmacy reached out and said she needs a prescription and authorization for her medication. She said she has 10 pills.   *STAT* If patient is at the pharmacy, call can be transferred to refill team.   1. Which medications need to be refilled? (please list name of each medication and dose if known) carvedilol 25 mg  2. Which pharmacy/location (including street and city if local pharmacy) is medication to be sent to? [facility] [phone]  3. Do they need a 30 day or 90 day supply? 30 day

## 2018-01-23 ENCOUNTER — Encounter: Payer: Self-pay | Admitting: Cardiovascular Disease

## 2018-01-23 ENCOUNTER — Ambulatory Visit: Payer: Medicare HMO | Admitting: Cardiovascular Disease

## 2018-01-23 VITALS — BP 118/68 | HR 76 | Ht 66.5 in | Wt 145.4 lb

## 2018-01-23 DIAGNOSIS — I2510 Atherosclerotic heart disease of native coronary artery without angina pectoris: Secondary | ICD-10-CM | POA: Diagnosis not present

## 2018-01-23 DIAGNOSIS — I493 Ventricular premature depolarization: Secondary | ICD-10-CM

## 2018-01-23 DIAGNOSIS — I5032 Chronic diastolic (congestive) heart failure: Secondary | ICD-10-CM | POA: Diagnosis not present

## 2018-01-23 DIAGNOSIS — I10 Essential (primary) hypertension: Secondary | ICD-10-CM

## 2018-01-23 DIAGNOSIS — E039 Hypothyroidism, unspecified: Secondary | ICD-10-CM

## 2018-01-23 DIAGNOSIS — I4891 Unspecified atrial fibrillation: Secondary | ICD-10-CM | POA: Diagnosis not present

## 2018-01-23 DIAGNOSIS — E7800 Pure hypercholesterolemia, unspecified: Secondary | ICD-10-CM | POA: Diagnosis not present

## 2018-01-23 DIAGNOSIS — I9789 Other postprocedural complications and disorders of the circulatory system, not elsewhere classified: Secondary | ICD-10-CM | POA: Diagnosis not present

## 2018-01-23 NOTE — Patient Instructions (Signed)
Medication Instructions:  Dr Croitoru recommends that you continue on your current medications as directed. Please refer to the Current Medication list given to you today.  If you need a refill on your cardiac medications before your next appointment, please call your pharmacy.   Follow-Up: At [facility], you and your health needs are our priority.  As part of our continuing mission to provide you with exceptional heart care, we have created designated Provider Care Teams.  These Care Teams include your primary Cardiologist (physician) and Advanced Practice Providers (APPs -  Physician Assistants and Nurse Practitioners) who all work together to provide you with the care you need, when you need it. You will need a follow up appointment in 12 months.  Please call our office 2 months in advance to schedule this appointment.  You may see Mihai Croitoru, MD or one of the following Advanced Practice Providers on your designated Care Team: Hao Meng, PA-C . Angela Duke, PA-C 

## 2018-01-23 NOTE — Progress Notes (Signed)
Patient ID: Marissa Jefferson, female   DOB: 1924-09-02, 84 y.o.   MRN: [id number]    Cardiology Office Note    Date:  01/23/2018   ID:  Marissa Jefferson, DOB 1924/08/16, MRN [id number]  PCP:  Merrilee Seashore, MD  Cardiologist:   Sanda Klein, MD   Chief Complaint  Patient presents with  . Coronary Artery Disease    History of Present Illness:  Marissa Jefferson is a 84 y.o. female with CAD and diastolic heart failure. Over 4 years have passed since her coronary artery bypass surgery. Preop EF was 20-25%, recovered to 55%.  She had paroxysmal atrial fibrillation in the immediate postop period without clinical recurrence.   She continues to live independently and generally feels well.  She denies any cardiovascular complaints.The patient specifically denies any chest pain at rest exertion, dyspnea at rest or with exertion, orthopnea, paroxysmal nocturnal dyspnea, syncope, palpitations, focal neurological deficits, intermittent claudication, lower extremity edema, unexplained weight gain, cough, hemoptysis or wheezing.  She reports that her blood sugar is well controlled on metformin monotherapy.  Still taking a full 325 mg dose of aspirin daily.  She is on maximum dose atorvastatin.  Labs are checked at [facility] (she is now seeing Dr. Ashby Dawes following Dr. Eugenio Hoes retirement).  Past Medical History:  Diagnosis Date  . Arthritis    "knees" (10/08/2013)  . Breast cancer ([place])    "left; I had 62 sessions of radiation"  . CAD (coronary artery disease), native coronary artery 10/06/2013   LAD 90%, CFX 90%, RCA 100% w/ collat  . Chronic combined systolic and diastolic CHF, NYHA class 2 ([place]) 09/2013  . CVA (cerebral vascular accident) ([place]) 1968   leaving no deficit  . DM2 (diabetes mellitus, type 2) ([place])   . GERD (gastroesophageal reflux disease)   . Hyperlipidemia   . Hypertension   . Hypothyroidism   . Ischemic dilated cardiomyopathy ([place]) 09/2013   EF 20-25% by echo  . NSVT  (nonsustained ventricular tachycardia) ([place]) 09/2013  . OAB (overactive bladder)   . Osteopenia   . 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 BREAST LUMPECTOMY Left 1998  . BREAST LUMPECTOMY WITH AXILLARY LYMPH NODE DISSECTION Left 1998  . CARDIAC CATHETERIZATION  10/06/2013  . CATARACT EXTRACTION W/ INTRAOCULAR LENS  IMPLANT, BILATERAL Bilateral ~ 2009  . COLONOSCOPY  2005  . CORONARY ARTERY BYPASS GRAFT [name] 10/09/2013   Procedure: CORONARY ARTERY BYPASS GRAFTING (CABG) x 4 using left internal mammary artery and right saphenous leg vein using endoscope.;  Surgeon: Melrose Nakayama, MD;  Location: [facility];  Service: Open Heart Surgery;  Laterality: [name];  . INTRAOPERATIVE TRANSESOPHAGEAL ECHOCARDIOGRAM [name] 10/09/2013   Procedure: INTRAOPERATIVE TRANSESOPHAGEAL ECHOCARDIOGRAM;  Surgeon: Melrose Nakayama, MD;  Location: [facility];  Service: Open Heart Surgery;  Laterality: [name];  . LEFT AND RIGHT HEART CATHETERIZATION WITH CORONARY ANGIOGRAM [name] 10/06/2013   Procedure: LEFT AND RIGHT HEART CATHETERIZATION WITH CORONARY ANGIOGRAM;  Surgeon: Sanda Klein, MD;  Location: [facility] CATH [facility];  Service: Cardiovascular;  Laterality: [name];  . TONSILLECTOMY AND ADENOIDECTOMY  1930's    Current Medications: Outpatient Medications Prior to Visit  Medication Sig Dispense Refill  . acetaminophen (TYLENOL) 500 MG tablet Take 1,000 mg by mouth 2 (two) times daily.    Marland Kitchen aspirin EC 325 MG EC tablet Take 1 tablet (325 mg total) by mouth daily. 30 tablet 0  . atorvastatin (LIPITOR) 80 MG tablet  Take 1 tablet by mouth every evening.     . Biotin 2500 MCG CAPS Take 1 capsule by mouth daily.    . carvedilol (COREG) 25 MG tablet TAKE 1 TABLET BY MOUTH TWICE A DAY WITH MEALS 60 tablet 1  . cholecalciferol (VITAMIN D) 1000 UNITS tablet Take 1,000 Units by mouth daily.    Mariane Baumgarten Calcium (STOOL SOFTENER PO) Take 1 tablet by  mouth daily as needed (CONSTIPATION).     Marland Kitchen levothyroxine (SYNTHROID, LEVOTHROID) 150 MCG tablet Take 150 mcg by mouth daily.    Marland Kitchen lisinopril (PRINIVIL,ZESTRIL) 10 MG tablet TAKE 1 TABLET BY MOUTH EVERY DAY 90 tablet 3  . Magnesium Oxide 400 (240 MG) MG TABS TAKE 1 TABLET TWICE DAILY. 60 tablet 9  . metFORMIN (GLUCOPHAGE) 500 MG tablet Take 2 (two) times daily with a meal by mouth.    . Multiple Vitamin (MULTIVITAMIN WITH MINERALS) TABS tablet Take 1 tablet by mouth daily.    . Nutritional Supplements (BOOST CALORIE SMART PO) Take 1 Can by mouth 2 (two) times daily.    . TOVIAZ 4 MG TB24 tablet Take 4 mg 2 (two) times daily by mouth.  3  . VOLTAREN 1 % GEL Apply 1 application topically daily as needed (knee pain).     Marland Kitchen amLODipine (NORVASC) 10 MG tablet Take 1 tablet (10 mg total) daily by mouth. 90 tablet 3  . furosemide (LASIX) 20 MG tablet Take 20 mg by mouth daily as needed.    . ondansetron (ZOFRAN) 4 MG tablet Take 1 tablet by mouth as needed for nausea or vomiting.      No facility-administered medications prior to visit.      Allergies:   Patient has no known allergies.   Social History   Socioeconomic History  . Marital status: Widowed    Spouse name: Not on file  . Number of children: Not on file  . Years of education: Not on file  . Highest education level: Not on file  Occupational History  . Not on file  Social Needs  . Financial resource strain: Not on file  . Food insecurity:    Worry: Not on file    Inability: Not on file  . Transportation needs:    Medical: Not on file    Non-medical: Not on file  Tobacco Use  . Smoking status: Former Smoker    Packs/day: 0.25    Years: 3.00    Pack years: 0.75    Types: Cigarettes    Last attempt to quit: 03/12/1948    Years since quitting: 69.9  . Smokeless tobacco: Never Used  . Tobacco comment: smoked in college   Substance and Sexual Activity  . Alcohol use: No    Alcohol/week: 0.0 standard drinks  . Drug use: No    . Sexual activity: Not Currently  Lifestyle  . Physical activity:    Days per week: Not on file    Minutes per session: Not on file  . Stress: Not on file  Relationships  . Social connections:    Talks on phone: Not on file    Gets together: Not on file    Attends religious service: Not on file    Active member of club or organization: Not on file    Attends meetings of clubs or organizations: Not on file    Relationship status: Not on file  Other Topics Concern  . Not on file  Social History Narrative  . Not on file  Family History:  The patient's family history includes Asthma in her daughter and son; Cancer in her father; Colon cancer in her mother; Heart attack in her father.   ROS:   Please see the history of present illness.    ROS all other systems are reviewed and are negative   PHYSICAL EXAM:   VS:  BP 118/68   Pulse 76   Ht 5' 6.5" (1.689 m)   Wt 145 lb 6.4 oz (66 kg)   BMI 23.12 kg/m    General: Alert, oriented x3, no distress, lean.  She appears younger than her stated age. Head: no evidence of trauma, PERRL, EOMI, no exophtalmos or lid lag, no myxedema, no xanthelasma; normal ears, nose and oropharynx Neck: normal jugular venous pulsations and no hepatojugular reflux; brisk carotid pulses without delay and no carotid bruits Chest: clear to auscultation, no signs of consolidation by percussion or palpation, normal fremitus, symmetrical and full respiratory excursions Cardiovascular: normal position and quality of the apical impulse, regular rhythm with occasional ectopy, normal first and widely split second heart sounds, no murmurs, rubs or gallops Abdomen: no tenderness or distention, no masses by palpation, no abnormal pulsatility or arterial bruits, normal bowel sounds, no hepatosplenomegaly Extremities: no clubbing, cyanosis or edema; 2+ radial, ulnar and brachial pulses bilaterally; 2+ right femoral, posterior tibial and dorsalis pedis pulses; 2+ left  femoral, posterior tibial and dorsalis pedis pulses; no subclavian or femoral bruits Neurological: grossly nonfocal Psych: Normal mood and affect    Wt Readings from Last 3 Encounters:  01/23/18 145 lb 6.4 oz (66 kg)  01/24/17 144 lb 9.6 oz (65.6 kg)  01/04/16 143 lb 3.2 oz (65 kg)      Studies/Labs Reviewed:   EKG:  EKG is ordered today.  The ekg ordered today demonstrates sinus rhythm with occasional PACs and PVCs, right bundle branch block, no repolarization abnormalities that would suggest ischemia.    Recent Labs: November 01, 2016 hemoglobin 15.2, glucose 167, hemoglobin A1c 7.7%, 1 7, creatinine 0.8, normal liver function tests, TSH 0.23 (mildly suppressed)   Lipid Panel    Component Value Date/Time   CHOL 135 05/10/2014 1440   TRIG 138 05/10/2014 1440   HDL 47 05/10/2014 1440   CHOLHDL 2.9 05/10/2014 1440   VLDL 28 05/10/2014 1440   LDLCALC 60 05/10/2014 1440   November 01, 2016 Total cholesterol 126, triglycerides 101, HDL 44, LDL 62   ASSESSMENT:    1. Chronic diastolic heart failure ([place])   2. Coronary artery disease involving native coronary artery of native heart without angina pectoris   3. Essential hypertension   4. Postoperative atrial fibrillation (HCC)   5. PVCs (premature ventricular contractions)   6. Hypercholesterolemia   7. Hypothyroidism (acquired)      PLAN:  In order of problems listed above:  1. Chronic diastolic heart failure.  Euvolemic without any diuretics.  Good functional status. 2. CAD s/p CABG July 2015.  No angina since her bypass procedure.  Reduce aspirin to 81 mg daily. 3. Essential hypertension well-controlled on amlodipine/lisinopril/carvedilol reviewed the importance of uninterrupted carvedilol therapy to avoid rebound. 4. Postop AFib has not recurred since surgery; not on anticoagulation.  Frequent PACs on electrocardiogram today.  On high-dose beta-blocker. 5. PVCs : Not symptomatic. 6. HLP: Excellent LDL cholesterol when  last checked.  On high-dose statin. 7.  Hypothyroidism on hormone supplement: Do not have a recent TSH.  Defer management to Dr. Ashby Dawes     Medication Adjustments/Labs and Tests Ordered:  Current medicines are reviewed at length with the patient today.  Concerns regarding medicines are outlined above.  Medication changes, Labs and Tests ordered today are listed in the Patient Instructions below. Patient Instructions  Medication Instructions:  Dr Sallyanne Kuster recommends that you continue on your current medications as directed. Please refer to the Current Medication list given to you today.  If you need a refill on your cardiac medications before your next appointment, please call your pharmacy.   Follow-Up: At [facility], you and your health needs are our priority.  As part of our continuing mission to provide you with exceptional heart care, we have created designated Provider Care Teams.  These Care Teams include your primary Cardiologist (physician) and Advanced Practice Providers (APPs -  Physician Assistants and Nurse Practitioners) who all work together to provide you with the care you need, when you need it. You will need a follow up appointment in 12 months.  Please call our office 2 months in advance to schedule this appointment.  You may see Sanda Klein, MD or one of the following Advanced Practice Providers on your designated Care Team: [place], [place] . Fabian Sharp, PA-C    Signed, Sanda Klein, MD  01/23/2018 5:50 PM    [facility] [place], [place], [place]  [address] Phone: [phone]; Fax: [phone]

## 2018-02-23 ENCOUNTER — Other Ambulatory Visit: Payer: Self-pay | Admitting: Cardiovascular Disease

## 2018-02-24 NOTE — Telephone Encounter (Signed)
Rx request sent to pharmacy.  

## 2018-04-22 ENCOUNTER — Other Ambulatory Visit: Payer: Self-pay | Admitting: Cardiovascular Disease

## 2018-05-22 DIAGNOSIS — E1142 Type 2 diabetes mellitus with diabetic polyneuropathy: Secondary | ICD-10-CM | POA: Diagnosis not present

## 2018-05-22 DIAGNOSIS — I110 Hypertensive heart disease with heart failure: Secondary | ICD-10-CM | POA: Diagnosis not present

## 2018-05-22 DIAGNOSIS — G3184 Mild cognitive impairment, so stated: Secondary | ICD-10-CM | POA: Diagnosis not present

## 2018-05-22 DIAGNOSIS — I4891 Unspecified atrial fibrillation: Secondary | ICD-10-CM | POA: Diagnosis not present

## 2018-05-22 DIAGNOSIS — E1159 Type 2 diabetes mellitus with other circulatory complications: Secondary | ICD-10-CM | POA: Diagnosis not present

## 2018-05-22 DIAGNOSIS — H5462 Unqualified visual loss, left eye, normal vision right eye: Secondary | ICD-10-CM | POA: Diagnosis not present

## 2018-05-22 DIAGNOSIS — G8929 Other chronic pain: Secondary | ICD-10-CM | POA: Diagnosis not present

## 2018-05-22 DIAGNOSIS — E785 Hyperlipidemia, unspecified: Secondary | ICD-10-CM | POA: Diagnosis not present

## 2018-05-22 DIAGNOSIS — I509 Heart failure, unspecified: Secondary | ICD-10-CM | POA: Diagnosis not present

## 2018-05-22 DIAGNOSIS — E039 Hypothyroidism, unspecified: Secondary | ICD-10-CM | POA: Diagnosis not present

## 2018-07-11 ENCOUNTER — Other Ambulatory Visit: Payer: Self-pay

## 2018-07-11 MED ORDER — CARVEDILOL 25 MG PO TABS: ORAL_TABLET | ORAL | 1 refills | Status: DC

## 2018-08-30 ENCOUNTER — Other Ambulatory Visit: Payer: Self-pay | Admitting: Cardiovascular Disease

## 2018-09-01 DIAGNOSIS — Z7189 Other specified counseling: Secondary | ICD-10-CM | POA: Diagnosis not present

## 2018-09-01 DIAGNOSIS — R269 Unspecified abnormalities of gait and mobility: Secondary | ICD-10-CM | POA: Diagnosis not present

## 2018-09-01 DIAGNOSIS — M170 Bilateral primary osteoarthritis of knee: Secondary | ICD-10-CM | POA: Diagnosis not present

## 2018-09-01 DIAGNOSIS — E1122 Type 2 diabetes mellitus with diabetic chronic kidney disease: Secondary | ICD-10-CM | POA: Diagnosis not present

## 2018-09-01 DIAGNOSIS — M6281 Muscle weakness (generalized): Secondary | ICD-10-CM | POA: Diagnosis not present

## 2018-09-04 DIAGNOSIS — M170 Bilateral primary osteoarthritis of knee: Secondary | ICD-10-CM | POA: Diagnosis not present

## 2018-09-04 DIAGNOSIS — N183 Chronic kidney disease, stage 3 (moderate): Secondary | ICD-10-CM | POA: Diagnosis not present

## 2018-09-04 DIAGNOSIS — N3281 Overactive bladder: Secondary | ICD-10-CM | POA: Diagnosis not present

## 2018-09-04 DIAGNOSIS — I2510 Atherosclerotic heart disease of native coronary artery without angina pectoris: Secondary | ICD-10-CM | POA: Diagnosis not present

## 2018-09-04 DIAGNOSIS — I255 Ischemic cardiomyopathy: Secondary | ICD-10-CM | POA: Diagnosis not present

## 2018-09-04 DIAGNOSIS — I130 Hypertensive heart and chronic kidney disease with heart failure and stage 1 through stage 4 chronic kidney disease, or unspecified chronic kidney disease: Secondary | ICD-10-CM | POA: Diagnosis not present

## 2018-09-04 DIAGNOSIS — I509 Heart failure, unspecified: Secondary | ICD-10-CM | POA: Diagnosis not present

## 2018-09-04 DIAGNOSIS — M8580 Other specified disorders of bone density and structure, unspecified site: Secondary | ICD-10-CM | POA: Diagnosis not present

## 2018-09-04 DIAGNOSIS — E1122 Type 2 diabetes mellitus with diabetic chronic kidney disease: Secondary | ICD-10-CM | POA: Diagnosis not present

## 2018-09-04 DIAGNOSIS — R69 Illness, unspecified: Secondary | ICD-10-CM | POA: Diagnosis not present

## 2018-09-08 DIAGNOSIS — M8580 Other specified disorders of bone density and structure, unspecified site: Secondary | ICD-10-CM | POA: Diagnosis not present

## 2018-09-08 DIAGNOSIS — I509 Heart failure, unspecified: Secondary | ICD-10-CM | POA: Diagnosis not present

## 2018-09-08 DIAGNOSIS — E1122 Type 2 diabetes mellitus with diabetic chronic kidney disease: Secondary | ICD-10-CM | POA: Diagnosis not present

## 2018-09-08 DIAGNOSIS — I2510 Atherosclerotic heart disease of native coronary artery without angina pectoris: Secondary | ICD-10-CM | POA: Diagnosis not present

## 2018-09-08 DIAGNOSIS — I255 Ischemic cardiomyopathy: Secondary | ICD-10-CM | POA: Diagnosis not present

## 2018-09-08 DIAGNOSIS — N183 Chronic kidney disease, stage 3 (moderate): Secondary | ICD-10-CM | POA: Diagnosis not present

## 2018-09-08 DIAGNOSIS — R69 Illness, unspecified: Secondary | ICD-10-CM | POA: Diagnosis not present

## 2018-09-08 DIAGNOSIS — I130 Hypertensive heart and chronic kidney disease with heart failure and stage 1 through stage 4 chronic kidney disease, or unspecified chronic kidney disease: Secondary | ICD-10-CM | POA: Diagnosis not present

## 2018-09-08 DIAGNOSIS — M170 Bilateral primary osteoarthritis of knee: Secondary | ICD-10-CM | POA: Diagnosis not present

## 2018-09-08 DIAGNOSIS — N3281 Overactive bladder: Secondary | ICD-10-CM | POA: Diagnosis not present

## 2018-09-10 DIAGNOSIS — I130 Hypertensive heart and chronic kidney disease with heart failure and stage 1 through stage 4 chronic kidney disease, or unspecified chronic kidney disease: Secondary | ICD-10-CM | POA: Diagnosis not present

## 2018-09-10 DIAGNOSIS — I255 Ischemic cardiomyopathy: Secondary | ICD-10-CM | POA: Diagnosis not present

## 2018-09-10 DIAGNOSIS — N183 Chronic kidney disease, stage 3 (moderate): Secondary | ICD-10-CM | POA: Diagnosis not present

## 2018-09-10 DIAGNOSIS — M170 Bilateral primary osteoarthritis of knee: Secondary | ICD-10-CM | POA: Diagnosis not present

## 2018-09-10 DIAGNOSIS — M8580 Other specified disorders of bone density and structure, unspecified site: Secondary | ICD-10-CM | POA: Diagnosis not present

## 2018-09-10 DIAGNOSIS — I509 Heart failure, unspecified: Secondary | ICD-10-CM | POA: Diagnosis not present

## 2018-09-10 DIAGNOSIS — N3281 Overactive bladder: Secondary | ICD-10-CM | POA: Diagnosis not present

## 2018-09-10 DIAGNOSIS — E1122 Type 2 diabetes mellitus with diabetic chronic kidney disease: Secondary | ICD-10-CM | POA: Diagnosis not present

## 2018-09-10 DIAGNOSIS — R69 Illness, unspecified: Secondary | ICD-10-CM | POA: Diagnosis not present

## 2018-09-10 DIAGNOSIS — I2510 Atherosclerotic heart disease of native coronary artery without angina pectoris: Secondary | ICD-10-CM | POA: Diagnosis not present

## 2018-09-12 DIAGNOSIS — M170 Bilateral primary osteoarthritis of knee: Secondary | ICD-10-CM | POA: Diagnosis not present

## 2018-09-12 DIAGNOSIS — N183 Chronic kidney disease, stage 3 (moderate): Secondary | ICD-10-CM | POA: Diagnosis not present

## 2018-09-12 DIAGNOSIS — I255 Ischemic cardiomyopathy: Secondary | ICD-10-CM | POA: Diagnosis not present

## 2018-09-12 DIAGNOSIS — R69 Illness, unspecified: Secondary | ICD-10-CM | POA: Diagnosis not present

## 2018-09-12 DIAGNOSIS — M8580 Other specified disorders of bone density and structure, unspecified site: Secondary | ICD-10-CM | POA: Diagnosis not present

## 2018-09-12 DIAGNOSIS — I130 Hypertensive heart and chronic kidney disease with heart failure and stage 1 through stage 4 chronic kidney disease, or unspecified chronic kidney disease: Secondary | ICD-10-CM | POA: Diagnosis not present

## 2018-09-12 DIAGNOSIS — I2510 Atherosclerotic heart disease of native coronary artery without angina pectoris: Secondary | ICD-10-CM | POA: Diagnosis not present

## 2018-09-12 DIAGNOSIS — N3281 Overactive bladder: Secondary | ICD-10-CM | POA: Diagnosis not present

## 2018-09-12 DIAGNOSIS — I509 Heart failure, unspecified: Secondary | ICD-10-CM | POA: Diagnosis not present

## 2018-09-12 DIAGNOSIS — E1122 Type 2 diabetes mellitus with diabetic chronic kidney disease: Secondary | ICD-10-CM | POA: Diagnosis not present

## 2018-09-15 DIAGNOSIS — N3281 Overactive bladder: Secondary | ICD-10-CM | POA: Diagnosis not present

## 2018-09-15 DIAGNOSIS — M8580 Other specified disorders of bone density and structure, unspecified site: Secondary | ICD-10-CM | POA: Diagnosis not present

## 2018-09-15 DIAGNOSIS — I255 Ischemic cardiomyopathy: Secondary | ICD-10-CM | POA: Diagnosis not present

## 2018-09-15 DIAGNOSIS — I2510 Atherosclerotic heart disease of native coronary artery without angina pectoris: Secondary | ICD-10-CM | POA: Diagnosis not present

## 2018-09-15 DIAGNOSIS — R69 Illness, unspecified: Secondary | ICD-10-CM | POA: Diagnosis not present

## 2018-09-15 DIAGNOSIS — I130 Hypertensive heart and chronic kidney disease with heart failure and stage 1 through stage 4 chronic kidney disease, or unspecified chronic kidney disease: Secondary | ICD-10-CM | POA: Diagnosis not present

## 2018-09-15 DIAGNOSIS — N183 Chronic kidney disease, stage 3 (moderate): Secondary | ICD-10-CM | POA: Diagnosis not present

## 2018-09-15 DIAGNOSIS — M170 Bilateral primary osteoarthritis of knee: Secondary | ICD-10-CM | POA: Diagnosis not present

## 2018-09-15 DIAGNOSIS — E1122 Type 2 diabetes mellitus with diabetic chronic kidney disease: Secondary | ICD-10-CM | POA: Diagnosis not present

## 2018-09-15 DIAGNOSIS — I509 Heart failure, unspecified: Secondary | ICD-10-CM | POA: Diagnosis not present

## 2018-09-17 DIAGNOSIS — I509 Heart failure, unspecified: Secondary | ICD-10-CM | POA: Diagnosis not present

## 2018-09-17 DIAGNOSIS — I130 Hypertensive heart and chronic kidney disease with heart failure and stage 1 through stage 4 chronic kidney disease, or unspecified chronic kidney disease: Secondary | ICD-10-CM | POA: Diagnosis not present

## 2018-09-17 DIAGNOSIS — E1122 Type 2 diabetes mellitus with diabetic chronic kidney disease: Secondary | ICD-10-CM | POA: Diagnosis not present

## 2018-09-17 DIAGNOSIS — M170 Bilateral primary osteoarthritis of knee: Secondary | ICD-10-CM | POA: Diagnosis not present

## 2018-09-19 DIAGNOSIS — I255 Ischemic cardiomyopathy: Secondary | ICD-10-CM | POA: Diagnosis not present

## 2018-09-19 DIAGNOSIS — I509 Heart failure, unspecified: Secondary | ICD-10-CM | POA: Diagnosis not present

## 2018-09-19 DIAGNOSIS — N183 Chronic kidney disease, stage 3 (moderate): Secondary | ICD-10-CM | POA: Diagnosis not present

## 2018-09-19 DIAGNOSIS — M170 Bilateral primary osteoarthritis of knee: Secondary | ICD-10-CM | POA: Diagnosis not present

## 2018-09-19 DIAGNOSIS — R69 Illness, unspecified: Secondary | ICD-10-CM | POA: Diagnosis not present

## 2018-09-19 DIAGNOSIS — E1122 Type 2 diabetes mellitus with diabetic chronic kidney disease: Secondary | ICD-10-CM | POA: Diagnosis not present

## 2018-09-19 DIAGNOSIS — I130 Hypertensive heart and chronic kidney disease with heart failure and stage 1 through stage 4 chronic kidney disease, or unspecified chronic kidney disease: Secondary | ICD-10-CM | POA: Diagnosis not present

## 2018-09-19 DIAGNOSIS — N3281 Overactive bladder: Secondary | ICD-10-CM | POA: Diagnosis not present

## 2018-09-19 DIAGNOSIS — M8580 Other specified disorders of bone density and structure, unspecified site: Secondary | ICD-10-CM | POA: Diagnosis not present

## 2018-09-19 DIAGNOSIS — I2510 Atherosclerotic heart disease of native coronary artery without angina pectoris: Secondary | ICD-10-CM | POA: Diagnosis not present

## 2018-09-22 DIAGNOSIS — M170 Bilateral primary osteoarthritis of knee: Secondary | ICD-10-CM | POA: Diagnosis not present

## 2018-09-22 DIAGNOSIS — I509 Heart failure, unspecified: Secondary | ICD-10-CM | POA: Diagnosis not present

## 2018-09-22 DIAGNOSIS — I2510 Atherosclerotic heart disease of native coronary artery without angina pectoris: Secondary | ICD-10-CM | POA: Diagnosis not present

## 2018-09-22 DIAGNOSIS — E1122 Type 2 diabetes mellitus with diabetic chronic kidney disease: Secondary | ICD-10-CM | POA: Diagnosis not present

## 2018-09-22 DIAGNOSIS — N183 Chronic kidney disease, stage 3 (moderate): Secondary | ICD-10-CM | POA: Diagnosis not present

## 2018-09-22 DIAGNOSIS — R69 Illness, unspecified: Secondary | ICD-10-CM | POA: Diagnosis not present

## 2018-09-22 DIAGNOSIS — I255 Ischemic cardiomyopathy: Secondary | ICD-10-CM | POA: Diagnosis not present

## 2018-09-22 DIAGNOSIS — M8580 Other specified disorders of bone density and structure, unspecified site: Secondary | ICD-10-CM | POA: Diagnosis not present

## 2018-09-22 DIAGNOSIS — I130 Hypertensive heart and chronic kidney disease with heart failure and stage 1 through stage 4 chronic kidney disease, or unspecified chronic kidney disease: Secondary | ICD-10-CM | POA: Diagnosis not present

## 2018-09-22 DIAGNOSIS — N3281 Overactive bladder: Secondary | ICD-10-CM | POA: Diagnosis not present

## 2018-09-24 DIAGNOSIS — I509 Heart failure, unspecified: Secondary | ICD-10-CM | POA: Diagnosis not present

## 2018-09-24 DIAGNOSIS — M8580 Other specified disorders of bone density and structure, unspecified site: Secondary | ICD-10-CM | POA: Diagnosis not present

## 2018-09-24 DIAGNOSIS — M170 Bilateral primary osteoarthritis of knee: Secondary | ICD-10-CM | POA: Diagnosis not present

## 2018-09-24 DIAGNOSIS — I130 Hypertensive heart and chronic kidney disease with heart failure and stage 1 through stage 4 chronic kidney disease, or unspecified chronic kidney disease: Secondary | ICD-10-CM | POA: Diagnosis not present

## 2018-09-24 DIAGNOSIS — E1122 Type 2 diabetes mellitus with diabetic chronic kidney disease: Secondary | ICD-10-CM | POA: Diagnosis not present

## 2018-09-24 DIAGNOSIS — N183 Chronic kidney disease, stage 3 (moderate): Secondary | ICD-10-CM | POA: Diagnosis not present

## 2018-09-24 DIAGNOSIS — N3281 Overactive bladder: Secondary | ICD-10-CM | POA: Diagnosis not present

## 2018-09-24 DIAGNOSIS — R69 Illness, unspecified: Secondary | ICD-10-CM | POA: Diagnosis not present

## 2018-09-24 DIAGNOSIS — I255 Ischemic cardiomyopathy: Secondary | ICD-10-CM | POA: Diagnosis not present

## 2018-09-24 DIAGNOSIS — I2510 Atherosclerotic heart disease of native coronary artery without angina pectoris: Secondary | ICD-10-CM | POA: Diagnosis not present

## 2018-09-29 DIAGNOSIS — R69 Illness, unspecified: Secondary | ICD-10-CM | POA: Diagnosis not present

## 2018-09-29 DIAGNOSIS — I509 Heart failure, unspecified: Secondary | ICD-10-CM | POA: Diagnosis not present

## 2018-09-29 DIAGNOSIS — I255 Ischemic cardiomyopathy: Secondary | ICD-10-CM | POA: Diagnosis not present

## 2018-09-29 DIAGNOSIS — M170 Bilateral primary osteoarthritis of knee: Secondary | ICD-10-CM | POA: Diagnosis not present

## 2018-09-29 DIAGNOSIS — I2510 Atherosclerotic heart disease of native coronary artery without angina pectoris: Secondary | ICD-10-CM | POA: Diagnosis not present

## 2018-09-29 DIAGNOSIS — I130 Hypertensive heart and chronic kidney disease with heart failure and stage 1 through stage 4 chronic kidney disease, or unspecified chronic kidney disease: Secondary | ICD-10-CM | POA: Diagnosis not present

## 2018-09-29 DIAGNOSIS — N3281 Overactive bladder: Secondary | ICD-10-CM | POA: Diagnosis not present

## 2018-09-29 DIAGNOSIS — E1122 Type 2 diabetes mellitus with diabetic chronic kidney disease: Secondary | ICD-10-CM | POA: Diagnosis not present

## 2018-09-29 DIAGNOSIS — N183 Chronic kidney disease, stage 3 (moderate): Secondary | ICD-10-CM | POA: Diagnosis not present

## 2018-09-29 DIAGNOSIS — M8580 Other specified disorders of bone density and structure, unspecified site: Secondary | ICD-10-CM | POA: Diagnosis not present

## 2018-09-30 DIAGNOSIS — H1851 Endothelial corneal dystrophy: Secondary | ICD-10-CM | POA: Diagnosis not present

## 2018-09-30 DIAGNOSIS — H43813 Vitreous degeneration, bilateral: Secondary | ICD-10-CM | POA: Diagnosis not present

## 2018-09-30 DIAGNOSIS — H02834 Dermatochalasis of left upper eyelid: Secondary | ICD-10-CM | POA: Diagnosis not present

## 2018-09-30 DIAGNOSIS — H40013 Open angle with borderline findings, low risk, bilateral: Secondary | ICD-10-CM | POA: Diagnosis not present

## 2018-09-30 DIAGNOSIS — Z961 Presence of intraocular lens: Secondary | ICD-10-CM | POA: Diagnosis not present

## 2018-09-30 DIAGNOSIS — H02831 Dermatochalasis of right upper eyelid: Secondary | ICD-10-CM | POA: Diagnosis not present

## 2018-10-31 ENCOUNTER — Other Ambulatory Visit: Payer: Self-pay | Admitting: Cardiovascular Disease

## 2018-10-31 DIAGNOSIS — E1122 Type 2 diabetes mellitus with diabetic chronic kidney disease: Secondary | ICD-10-CM | POA: Diagnosis not present

## 2018-10-31 DIAGNOSIS — Z0000 Encounter for general adult medical examination without abnormal findings: Secondary | ICD-10-CM | POA: Diagnosis not present

## 2018-10-31 DIAGNOSIS — E785 Hyperlipidemia, unspecified: Secondary | ICD-10-CM | POA: Diagnosis not present

## 2018-10-31 DIAGNOSIS — Z7189 Other specified counseling: Secondary | ICD-10-CM | POA: Diagnosis not present

## 2018-10-31 DIAGNOSIS — E1165 Type 2 diabetes mellitus with hyperglycemia: Secondary | ICD-10-CM | POA: Diagnosis not present

## 2018-10-31 DIAGNOSIS — I2510 Atherosclerotic heart disease of native coronary artery without angina pectoris: Secondary | ICD-10-CM | POA: Diagnosis not present

## 2018-10-31 DIAGNOSIS — I129 Hypertensive chronic kidney disease with stage 1 through stage 4 chronic kidney disease, or unspecified chronic kidney disease: Secondary | ICD-10-CM | POA: Diagnosis not present

## 2018-10-31 DIAGNOSIS — M810 Age-related osteoporosis without current pathological fracture: Secondary | ICD-10-CM | POA: Diagnosis not present

## 2018-11-11 DIAGNOSIS — E782 Mixed hyperlipidemia: Secondary | ICD-10-CM | POA: Diagnosis not present

## 2018-11-11 DIAGNOSIS — I129 Hypertensive chronic kidney disease with stage 1 through stage 4 chronic kidney disease, or unspecified chronic kidney disease: Secondary | ICD-10-CM | POA: Diagnosis not present

## 2018-11-11 DIAGNOSIS — M810 Age-related osteoporosis without current pathological fracture: Secondary | ICD-10-CM | POA: Diagnosis not present

## 2018-11-11 DIAGNOSIS — Z0000 Encounter for general adult medical examination without abnormal findings: Secondary | ICD-10-CM | POA: Diagnosis not present

## 2018-11-11 DIAGNOSIS — I5022 Chronic systolic (congestive) heart failure: Secondary | ICD-10-CM | POA: Diagnosis not present

## 2018-11-11 DIAGNOSIS — E1165 Type 2 diabetes mellitus with hyperglycemia: Secondary | ICD-10-CM | POA: Diagnosis not present

## 2018-11-11 DIAGNOSIS — R69 Illness, unspecified: Secondary | ICD-10-CM | POA: Diagnosis not present

## 2018-11-11 DIAGNOSIS — E039 Hypothyroidism, unspecified: Secondary | ICD-10-CM | POA: Diagnosis not present

## 2018-11-11 DIAGNOSIS — N183 Chronic kidney disease, stage 3 (moderate): Secondary | ICD-10-CM | POA: Diagnosis not present

## 2018-11-11 DIAGNOSIS — I2510 Atherosclerotic heart disease of native coronary artery without angina pectoris: Secondary | ICD-10-CM | POA: Diagnosis not present

## 2018-11-28 DIAGNOSIS — Z0100 Encounter for examination of eyes and vision without abnormal findings: Secondary | ICD-10-CM | POA: Diagnosis not present

## 2019-01-16 DIAGNOSIS — R69 Illness, unspecified: Secondary | ICD-10-CM | POA: Diagnosis not present

## 2019-01-20 ENCOUNTER — Other Ambulatory Visit: Payer: Self-pay | Admitting: Cardiovascular Disease

## 2019-01-23 ENCOUNTER — Telehealth: Payer: Self-pay | Admitting: *Deleted

## 2019-01-23 ENCOUNTER — Encounter: Payer: Self-pay | Admitting: Cardiovascular Disease

## 2019-01-23 ENCOUNTER — Telehealth (INDEPENDENT_AMBULATORY_CARE_PROVIDER_SITE_OTHER): Payer: Medicare HMO | Admitting: Cardiovascular Disease

## 2019-01-23 VITALS — BP 119/68 | Ht 66.0 in | Wt 142.0 lb

## 2019-01-23 DIAGNOSIS — I5032 Chronic diastolic (congestive) heart failure: Secondary | ICD-10-CM

## 2019-01-23 DIAGNOSIS — I9789 Other postprocedural complications and disorders of the circulatory system, not elsewhere classified: Secondary | ICD-10-CM

## 2019-01-23 DIAGNOSIS — Z951 Presence of aortocoronary bypass graft: Secondary | ICD-10-CM | POA: Diagnosis not present

## 2019-01-23 DIAGNOSIS — E7800 Pure hypercholesterolemia, unspecified: Secondary | ICD-10-CM

## 2019-01-23 DIAGNOSIS — I10 Essential (primary) hypertension: Secondary | ICD-10-CM | POA: Diagnosis not present

## 2019-01-23 DIAGNOSIS — I4891 Unspecified atrial fibrillation: Secondary | ICD-10-CM | POA: Diagnosis not present

## 2019-01-23 DIAGNOSIS — E119 Type 2 diabetes mellitus without complications: Secondary | ICD-10-CM | POA: Diagnosis not present

## 2019-01-23 DIAGNOSIS — I2510 Atherosclerotic heart disease of native coronary artery without angina pectoris: Secondary | ICD-10-CM | POA: Diagnosis not present

## 2019-01-23 MED ORDER — ASPIRIN EC 81 MG PO TBEC: 81.0000 mg | DELAYED_RELEASE_TABLET | Freq: Every day | ORAL | 3 refills | Status: AC

## 2019-01-23 NOTE — Telephone Encounter (Signed)
Virtual Visit Pre-Appointment Phone Call  "(Name), I am calling you today to discuss your upcoming appointment. We are currently trying to limit exposure to the virus that causes COVID-19 by seeing patients at home rather than in the office."  1. "What is the BEST phone number to call the day of the visit?" - include this in appointment notes  2. "Do you have or have access to (through a family member/friend) a smartphone with video capability that we can use for your visit?" a. If yes - list this number in appt notes as "cell" (if different from BEST phone #) and list the appointment type as a VIDEO visit in appointment notes b. If no - list the appointment type as a PHONE visit in appointment notes  Confirm consent - "In the setting of the current Covid19 crisis, you are scheduled for a (phone or video) visit with your provider on (date) at (time).  Just as we do with many in-office visits, in order for you to participate in this visit, we must obtain consent.  If you'd like, I can send this to your mychart (if signed up) or email for you to review.  Otherwise, I can obtain your verbal consent now.  All virtual visits are billed to your insurance company just like a normal visit would be.  By agreeing to a virtual visit, we'd like you to understand that the technology does not allow for your provider to perform an examination, and thus may limit your provider's ability to fully assess your condition. If your provider identifies any concerns that need to be evaluated in person, we will make arrangements to do so.  Finally, though the technology is pretty good, we cannot assure that it will always work on either your or our end, and in the setting of a video visit, we may have to convert it to a phone-only visit.  In either situation, we cannot ensure that we have a secure connection.  Are you willing to proceed?" Yes  3. Advise patient to be prepared - "Two hours prior to your appointment, go  ahead and check your blood pressure, pulse, oxygen saturation, and your weight (if you have the equipment to check those) and write them all down. When your visit starts, your provider will ask you for this information. If you have an Apple Watch or Kardia device, please plan to have heart rate information ready on the day of your appointment. Please have a pen and paper handy nearby the day of the visit as well."  4. Give patient instructions for MyChart download to smartphone OR [email or web address] as below if video visit (depending on what platform provider is using)  5. Inform patient they will receive a phone call 15 minutes prior to their appointment time (may be from unknown caller ID) so they should be prepared to answer    TELEPHONE CALL NOTE  Marissa Jefferson has been deemed a candidate for a follow-up tele-health visit to limit community exposure during the Covid-19 pandemic. I spoke with the patient via phone to ensure availability of phone/video source, confirm preferred email & phone number, and discuss instructions and expectations.  I reminded Marissa Jefferson to be prepared with any vital sign and/or heart rhythm information that could potentially be obtained via home monitoring, at the time of her visit. I reminded Marissa Jefferson to expect a phone call prior to her visit.  Ricci Barker, RN 01/23/2019 9:51 AM      FULL LENGTH  CONSENT FOR TELE-HEALTH VISIT   I hereby voluntarily request, consent and authorize [facility] and its employed or contracted physicians, physician assistants, nurse practitioners or other licensed health care professionals (the Practitioner), to provide me with telemedicine health care services (the "Services") as deemed necessary by the treating Practitioner. I acknowledge and consent to receive the Services by the Practitioner via telemedicine. I understand that the telemedicine visit will involve communicating with the Practitioner through live audiovisual  communication technology and the disclosure of certain medical information by electronic transmission. I acknowledge that I have been given the opportunity to request an in-person assessment or other available alternative prior to the telemedicine visit and am voluntarily participating in the telemedicine visit.  I understand that I have the right to withhold or withdraw my consent to the use of telemedicine in the course of my care at any time, without affecting my right to future care or treatment, and that the Practitioner or I may terminate the telemedicine visit at any time. I understand that I have the right to inspect all information obtained and/or recorded in the course of the telemedicine visit and may receive copies of available information for a reasonable fee.  I understand that some of the potential risks of receiving the Services via telemedicine include:  Marland Kitchen Delay or interruption in medical evaluation due to technological equipment failure or disruption; . Information transmitted may not be sufficient (e.g. poor resolution of images) to allow for appropriate medical decision making by the Practitioner; and/or  . In rare instances, security protocols could fail, causing a breach of personal health information.  Furthermore, I acknowledge that it is my responsibility to provide information about my medical history, conditions and care that is complete and accurate to the best of my ability. I acknowledge that Practitioner's advice, recommendations, and/or decision may be based on factors not within their control, such as incomplete or inaccurate data provided by me or distortions of diagnostic images or specimens that may result from electronic transmissions. I understand that the practice of medicine is not an exact science and that Practitioner makes no warranties or guarantees regarding treatment outcomes. I acknowledge that I will receive a copy of this consent concurrently upon execution via  email to the email address I last provided but may also request a printed copy by calling the office of [facility].    I understand that my insurance will be billed for this visit.   I have read or had this consent read to me. . I understand the contents of this consent, which adequately explains the benefits and risks of the Services being provided via telemedicine.  . I have been provided ample opportunity to ask questions regarding this consent and the Services and have had my questions answered to my satisfaction. . I give my informed consent for the services to be provided through the use of telemedicine in my medical care  By participating in this telemedicine visit I agree to the above.

## 2019-01-23 NOTE — Patient Instructions (Signed)

## 2019-01-23 NOTE — Progress Notes (Signed)
Virtual Visit via Telephone Note   This visit type was conducted due to national recommendations for restrictions regarding the COVID-19 Pandemic (e.g. social distancing) in an effort to limit this patient's exposure and mitigate transmission in our community.  Due to her co-morbid illnesses, this patient is at least at moderate risk for complications without adequate follow up.  This format is felt to be most appropriate for this patient at this time.  The patient did not have access to video technology/had technical difficulties with video requiring transitioning to audio format only (telephone).  All issues noted in this document were discussed and addressed.  No physical exam could be performed with this format.  Please refer to the patient's chart for her  consent to telehealth for [facility].   Date:  01/23/2019   ID:  Marissa Jefferson, DOB 25-Oct-1924, MRN [id number]  Patient Location: Home Provider Location: Other:  [facility]  PCP:  Merrilee Seashore, MD  Cardiologist: Labradford Schnitker Electrophysiologist:  None   Evaluation Performed:  Follow-Up Visit  Chief Complaint:  CAD  History of Present Illness:    Marissa Jefferson is a 85 y.o. female with CAD s/p CABG 123456, chronic diastolic heart failure, postoperative atrial fibrillation without subsequent recurrence, essential hypertension, type 2 diabetes mellitus, mixed hyperlipidemia, history of left breast cancer with surgery and radiation.  When she initially presented in 2015 she had severe left ventricular systolic dysfunction with an ejection fraction of 20-25% and episodes of nonsustained ventricular tachycardia.  Following surgery left ventricular systolic function completely recovered to an EF of 55%.  The patient specifically denies any chest pain at rest exertion, dyspnea at rest or with exertion, orthopnea, paroxysmal nocturnal dyspnea, syncope, palpitations, focal neurological deficits, intermittent claudication, lower  extremity edema, unexplained weight gain, cough, hemoptysis or wheezing.  She had some physical therapy in the year because she found the stairs to her apartment were rather daunting.  She is stronger now and exercises on the stairs with a neighbor daily.  She lost a couple of pounds recently after having dental surgery and been limited to liquid foods.  The patient does not have symptoms concerning for COVID-19 infection (fever, chills, cough, or new shortness of breath).    Past Medical History:  Diagnosis Date  . Arthritis    "knees" (10/08/2013)  . Breast cancer ([place])    "left; I had 62 sessions of radiation"  . CAD (coronary artery disease), native coronary artery 10/06/2013   LAD 90%, CFX 90%, RCA 100% w/ collat  . Chronic combined systolic and diastolic CHF, NYHA class 2 ([place]) 09/2013  . CVA (cerebral vascular accident) ([place]) 1968   leaving no deficit  . DM2 (diabetes mellitus, type 2) ([place])   . GERD (gastroesophageal reflux disease)   . Hyperlipidemia   . Hypertension   . Hypothyroidism   . Ischemic dilated cardiomyopathy ([place]) 09/2013   EF 20-25% by echo  . NSVT (nonsustained ventricular tachycardia) ([place]) 09/2013  . OAB (overactive bladder)   . Osteopenia   . 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 BREAST LUMPECTOMY Left 1998  . BREAST LUMPECTOMY WITH AXILLARY LYMPH NODE DISSECTION Left 1998  . CARDIAC CATHETERIZATION  10/06/2013  . CATARACT EXTRACTION W/ INTRAOCULAR LENS  IMPLANT, BILATERAL Bilateral ~ 2009  . COLONOSCOPY  2005  . CORONARY ARTERY BYPASS GRAFT [name] 10/09/2013   Procedure: CORONARY ARTERY BYPASS GRAFTING (CABG) x 4 using left internal mammary  artery and right saphenous leg vein using endoscope.;  Surgeon: Melrose Nakayama, MD;  Location: [place];  Service: Open Heart Surgery;  Laterality: [name];  . INTRAOPERATIVE TRANSESOPHAGEAL ECHOCARDIOGRAM [name] 10/09/2013    Procedure: INTRAOPERATIVE TRANSESOPHAGEAL ECHOCARDIOGRAM;  Surgeon: Melrose Nakayama, MD;  Location: [place];  Service: Open Heart Surgery;  Laterality: [name];  . LEFT AND RIGHT HEART CATHETERIZATION WITH CORONARY ANGIOGRAM [name] 10/06/2013   Procedure: LEFT AND RIGHT HEART CATHETERIZATION WITH CORONARY ANGIOGRAM;  Surgeon: Sanda Klein, MD;  Location: [place] C[facility];  Service: Cardiovascular;  Laterality: [name];  . TONSILLECTOMY AND ADENOIDECTOMY  1930's     Current Meds  Medication Sig  . acetaminophen (TYLENOL) 500 MG tablet Take 1,000 mg by mouth 2 (two) times daily.  Marland Kitchen amLODipine (NORVASC) 10 MG tablet TAKE 1 TABLET BY MOUTH EVERY DAY  . atorvastatin (LIPITOR) 80 MG tablet Take 1 tablet by mouth every evening.   . Biotin 2500 MCG CAPS Take 1 capsule by mouth daily.  . carvedilol (COREG) 25 MG tablet TAKE 1 TABLET BY MOUTH TWICE A DAY WITH MEALS  . cholecalciferol (VITAMIN D) 1000 UNITS tablet Take 1,000 Units by mouth daily.  Mariane Baumgarten Calcium (STOOL SOFTENER PO) Take 1 tablet by mouth daily as needed (CONSTIPATION).   Marland Kitchen levothyroxine (SYNTHROID, LEVOTHROID) 150 MCG tablet Take 150 mcg by mouth daily.  Marland Kitchen lisinopril (ZESTRIL) 10 MG tablet TAKE 1 TABLET BY MOUTH EVERY DAY  . Magnesium Oxide 400 (240 MG) MG TABS TAKE 1 TABLET TWICE DAILY.  . metFORMIN (GLUCOPHAGE) 500 MG tablet Take 2 (two) times daily with a meal by mouth.  . Multiple Vitamin (MULTIVITAMIN WITH MINERALS) TABS tablet Take 1 tablet by mouth daily.  . Nutritional Supplements (BOOST CALORIE SMART PO) Take 1 Can by mouth 2 (two) times daily.  . TOVIAZ 4 MG TB24 tablet Take 4 mg 2 (two) times daily by mouth.  . VOLTAREN 1 % GEL Apply 1 application topically daily as needed (knee pain).   . [DISCONTINUED] aspirin EC 325 MG EC tablet Take 1 tablet (325 mg total) by mouth daily. (Patient taking differently: Take 81 mg by mouth daily. )     Allergies:   Patient has no known allergies.   Social History   Tobacco Use  . Smoking  status: Former Smoker    Packs/day: 0.25    Years: 3.00    Pack years: 0.75    Types: Cigarettes    Quit date: 03/12/1948    Years since quitting: 70.9  . Smokeless tobacco: Never Used  . Tobacco comment: smoked in college   Substance Use Topics  . Alcohol use: No    Alcohol/week: 0.0 standard drinks  . Drug use: No     Family Hx: The patient's family history includes Asthma in her daughter and son; Cancer in her father; Colon cancer in her mother; Heart attack in her father.  ROS:   Please see the history of present illness.    All other systems reviewed and are negative.   Prior CV studies:   The following studies were reviewed today:  Labs/Other Tests and Data Reviewed:    EKG:  An ECG dated 01/23/2018 was personally reviewed today and demonstrated:  Sinus rhythm with PACs and PVCs and old right bundle branch block, QTC 477 ms  Recent Labs: No results found for requested labs within last 8760 hours.   Recent Lipid Panel Lab Results  Component Value Date/Time   CHOL 135 05/10/2014 02:40 PM  TRIG 138 05/10/2014 02:40 PM   HDL 47 05/10/2014 02:40 PM   CHOLHDL 2.9 05/10/2014 02:40 PM   LDLCALC 60 05/10/2014 02:40 PM  11/13/2017 total cholesterol 129, HDL 43, triglycerides 101, hemoglobin A1c 7.6%, hemoglobin 16.2, creatinine 0.1, TSH 0.127  Wt Readings from Last 3 Encounters:  01/23/19 142 lb (64.4 kg)  01/23/18 145 lb 6.4 oz (66 kg)  01/24/17 144 lb 9.6 oz (65.6 kg)     Objective:    Vital Signs:  BP 119/68   Ht 5\' 6"  (1.676 m)   Wt 142 lb (64.4 kg)   BMI 22.92 kg/m    VITAL SIGNS:  reviewed Unable to examine  ASSESSMENT & PLAN:    1. CAD: Asymptomatic, CCS class I.  On aspirin, statin, beta-blocker. 2. CHF: Compensated, NYHA functional class I, does not require loop diuretics.  Weight is stable around 142-144 pounds.  She is careful with sodium restriction. 3. HTN: Well-controlled 4. HLP: We will get her most recent labs from August, which reports that  Dr. Ashby Dawes was very pleased with her lipid profile then.  She has repeat lab scheduled in December. 5. DM: Glucose control is the only unaddressed risk factor.  She is lean and tries to exercise.  She is on Metformin.  She believes that her most recent hemoglobin A1c may have been a bit of an exaggeration since it was drawn not long after her birthday when she overindulged in chocolate cake. 6. Postop atrial fibrillation: No clinically evident arrhythmia since the immediate postoperative period.Marland Kitchen   COVID-19 Education: The signs and symptoms of COVID-19 were discussed with the patient and how to seek care for testing (follow up with PCP or arrange E-visit).  The importance of social distancing was discussed today.  Time:   Today, I have spent 21 minutes with the patient with telehealth technology discussing the above problems.     Medication Adjustments/Labs and Tests Ordered: Current medicines are reviewed at length with the patient today.  Concerns regarding medicines are outlined above.   Tests Ordered: No orders of the defined types were placed in this encounter.   Medication Changes: Meds ordered this encounter  Medications  . aspirin EC 81 MG tablet    Sig: Take 1 tablet (81 mg total) by mouth daily.    Dispense:  90 tablet    Refill:  3    Follow Up:  In Person 1 year  Signed, Sanda Klein, MD  01/23/2019 10:00 AM    [place]

## 2019-02-13 DIAGNOSIS — D485 Neoplasm of uncertain behavior of skin: Secondary | ICD-10-CM | POA: Diagnosis not present

## 2019-02-13 DIAGNOSIS — L820 Inflamed seborrheic keratosis: Secondary | ICD-10-CM | POA: Diagnosis not present

## 2019-02-13 DIAGNOSIS — L821 Other seborrheic keratosis: Secondary | ICD-10-CM | POA: Diagnosis not present

## 2019-02-13 DIAGNOSIS — L570 Actinic keratosis: Secondary | ICD-10-CM | POA: Diagnosis not present

## 2019-02-17 DIAGNOSIS — E1122 Type 2 diabetes mellitus with diabetic chronic kidney disease: Secondary | ICD-10-CM | POA: Diagnosis not present

## 2019-02-17 DIAGNOSIS — E785 Hyperlipidemia, unspecified: Secondary | ICD-10-CM | POA: Diagnosis not present

## 2019-02-17 DIAGNOSIS — N183 Chronic kidney disease, stage 3 unspecified: Secondary | ICD-10-CM | POA: Diagnosis not present

## 2019-02-17 DIAGNOSIS — E1165 Type 2 diabetes mellitus with hyperglycemia: Secondary | ICD-10-CM | POA: Diagnosis not present

## 2019-02-17 DIAGNOSIS — I5022 Chronic systolic (congestive) heart failure: Secondary | ICD-10-CM | POA: Diagnosis not present

## 2019-02-24 ENCOUNTER — Other Ambulatory Visit: Payer: Self-pay | Admitting: Cardiovascular Disease

## 2019-02-25 DIAGNOSIS — E1165 Type 2 diabetes mellitus with hyperglycemia: Secondary | ICD-10-CM | POA: Diagnosis not present

## 2019-03-14 ENCOUNTER — Other Ambulatory Visit: Payer: Self-pay | Admitting: Cardiovascular Disease

## 2019-03-20 ENCOUNTER — Other Ambulatory Visit: Payer: Self-pay | Admitting: Cardiovascular Disease

## 2019-05-28 DIAGNOSIS — E1165 Type 2 diabetes mellitus with hyperglycemia: Secondary | ICD-10-CM | POA: Diagnosis not present

## 2019-05-28 DIAGNOSIS — M810 Age-related osteoporosis without current pathological fracture: Secondary | ICD-10-CM | POA: Diagnosis not present

## 2019-06-01 DIAGNOSIS — I10 Essential (primary) hypertension: Secondary | ICD-10-CM | POA: Diagnosis not present

## 2019-06-01 DIAGNOSIS — I2510 Atherosclerotic heart disease of native coronary artery without angina pectoris: Secondary | ICD-10-CM | POA: Diagnosis not present

## 2019-06-01 DIAGNOSIS — Z008 Encounter for other general examination: Secondary | ICD-10-CM | POA: Diagnosis not present

## 2019-06-01 DIAGNOSIS — E119 Type 2 diabetes mellitus without complications: Secondary | ICD-10-CM | POA: Diagnosis not present

## 2019-06-01 DIAGNOSIS — R32 Unspecified urinary incontinence: Secondary | ICD-10-CM | POA: Diagnosis not present

## 2019-06-01 DIAGNOSIS — K5900 Constipation, unspecified: Secondary | ICD-10-CM | POA: Diagnosis not present

## 2019-06-01 DIAGNOSIS — M1990 Unspecified osteoarthritis, unspecified site: Secondary | ICD-10-CM | POA: Diagnosis not present

## 2019-06-01 DIAGNOSIS — E785 Hyperlipidemia, unspecified: Secondary | ICD-10-CM | POA: Diagnosis not present

## 2019-06-01 DIAGNOSIS — N3281 Overactive bladder: Secondary | ICD-10-CM | POA: Diagnosis not present

## 2019-06-01 DIAGNOSIS — M810 Age-related osteoporosis without current pathological fracture: Secondary | ICD-10-CM | POA: Diagnosis not present

## 2019-06-01 DIAGNOSIS — E039 Hypothyroidism, unspecified: Secondary | ICD-10-CM | POA: Diagnosis not present

## 2019-07-21 DIAGNOSIS — R69 Illness, unspecified: Secondary | ICD-10-CM | POA: Diagnosis not present

## 2019-07-21 DIAGNOSIS — I2510 Atherosclerotic heart disease of native coronary artery without angina pectoris: Secondary | ICD-10-CM | POA: Diagnosis not present

## 2019-07-21 DIAGNOSIS — I129 Hypertensive chronic kidney disease with stage 1 through stage 4 chronic kidney disease, or unspecified chronic kidney disease: Secondary | ICD-10-CM | POA: Diagnosis not present

## 2019-07-21 DIAGNOSIS — E1165 Type 2 diabetes mellitus with hyperglycemia: Secondary | ICD-10-CM | POA: Diagnosis not present

## 2019-07-21 DIAGNOSIS — E039 Hypothyroidism, unspecified: Secondary | ICD-10-CM | POA: Diagnosis not present

## 2019-07-21 DIAGNOSIS — N183 Chronic kidney disease, stage 3 unspecified: Secondary | ICD-10-CM | POA: Diagnosis not present

## 2019-07-21 DIAGNOSIS — E1122 Type 2 diabetes mellitus with diabetic chronic kidney disease: Secondary | ICD-10-CM | POA: Diagnosis not present

## 2019-07-21 DIAGNOSIS — E782 Mixed hyperlipidemia: Secondary | ICD-10-CM | POA: Diagnosis not present

## 2019-07-21 DIAGNOSIS — M810 Age-related osteoporosis without current pathological fracture: Secondary | ICD-10-CM | POA: Diagnosis not present

## 2019-07-21 DIAGNOSIS — I5022 Chronic systolic (congestive) heart failure: Secondary | ICD-10-CM | POA: Diagnosis not present

## 2019-10-02 ENCOUNTER — Other Ambulatory Visit: Payer: Self-pay | Admitting: Cardiovascular Disease

## 2019-12-02 DIAGNOSIS — E039 Hypothyroidism, unspecified: Secondary | ICD-10-CM | POA: Diagnosis not present

## 2019-12-02 DIAGNOSIS — E1122 Type 2 diabetes mellitus with diabetic chronic kidney disease: Secondary | ICD-10-CM | POA: Diagnosis not present

## 2019-12-02 DIAGNOSIS — R269 Unspecified abnormalities of gait and mobility: Secondary | ICD-10-CM | POA: Diagnosis not present

## 2019-12-02 DIAGNOSIS — Z0000 Encounter for general adult medical examination without abnormal findings: Secondary | ICD-10-CM | POA: Diagnosis not present

## 2019-12-02 DIAGNOSIS — E1165 Type 2 diabetes mellitus with hyperglycemia: Secondary | ICD-10-CM | POA: Diagnosis not present

## 2019-12-02 DIAGNOSIS — Z23 Encounter for immunization: Secondary | ICD-10-CM | POA: Diagnosis not present

## 2019-12-02 DIAGNOSIS — I5022 Chronic systolic (congestive) heart failure: Secondary | ICD-10-CM | POA: Diagnosis not present

## 2019-12-02 DIAGNOSIS — M810 Age-related osteoporosis without current pathological fracture: Secondary | ICD-10-CM | POA: Diagnosis not present

## 2019-12-02 DIAGNOSIS — E782 Mixed hyperlipidemia: Secondary | ICD-10-CM | POA: Diagnosis not present

## 2019-12-02 DIAGNOSIS — N390 Urinary tract infection, site not specified: Secondary | ICD-10-CM | POA: Diagnosis not present

## 2019-12-17 DIAGNOSIS — E782 Mixed hyperlipidemia: Secondary | ICD-10-CM | POA: Diagnosis not present

## 2019-12-17 DIAGNOSIS — N1831 Chronic kidney disease, stage 3a: Secondary | ICD-10-CM | POA: Diagnosis not present

## 2019-12-17 DIAGNOSIS — I2510 Atherosclerotic heart disease of native coronary artery without angina pectoris: Secondary | ICD-10-CM | POA: Diagnosis not present

## 2019-12-17 DIAGNOSIS — E1165 Type 2 diabetes mellitus with hyperglycemia: Secondary | ICD-10-CM | POA: Diagnosis not present

## 2019-12-17 DIAGNOSIS — R69 Illness, unspecified: Secondary | ICD-10-CM | POA: Diagnosis not present

## 2019-12-17 DIAGNOSIS — Z0000 Encounter for general adult medical examination without abnormal findings: Secondary | ICD-10-CM | POA: Diagnosis not present

## 2019-12-17 DIAGNOSIS — I5022 Chronic systolic (congestive) heart failure: Secondary | ICD-10-CM | POA: Diagnosis not present

## 2019-12-17 DIAGNOSIS — I129 Hypertensive chronic kidney disease with stage 1 through stage 4 chronic kidney disease, or unspecified chronic kidney disease: Secondary | ICD-10-CM | POA: Diagnosis not present

## 2019-12-17 DIAGNOSIS — E1122 Type 2 diabetes mellitus with diabetic chronic kidney disease: Secondary | ICD-10-CM | POA: Diagnosis not present

## 2019-12-17 DIAGNOSIS — E039 Hypothyroidism, unspecified: Secondary | ICD-10-CM | POA: Diagnosis not present

## 2019-12-29 DIAGNOSIS — H5203 Hypermetropia, bilateral: Secondary | ICD-10-CM | POA: Diagnosis not present

## 2019-12-29 DIAGNOSIS — Z961 Presence of intraocular lens: Secondary | ICD-10-CM | POA: Diagnosis not present

## 2019-12-29 DIAGNOSIS — H18513 Endothelial corneal dystrophy, bilateral: Secondary | ICD-10-CM | POA: Diagnosis not present

## 2019-12-29 DIAGNOSIS — E119 Type 2 diabetes mellitus without complications: Secondary | ICD-10-CM | POA: Diagnosis not present

## 2019-12-29 DIAGNOSIS — H52223 Regular astigmatism, bilateral: Secondary | ICD-10-CM | POA: Diagnosis not present

## 2019-12-29 DIAGNOSIS — H524 Presbyopia: Secondary | ICD-10-CM | POA: Diagnosis not present

## 2019-12-29 DIAGNOSIS — H35033 Hypertensive retinopathy, bilateral: Secondary | ICD-10-CM | POA: Diagnosis not present

## 2019-12-29 DIAGNOSIS — Z9849 Cataract extraction status, unspecified eye: Secondary | ICD-10-CM | POA: Diagnosis not present

## 2019-12-29 DIAGNOSIS — I10 Essential (primary) hypertension: Secondary | ICD-10-CM | POA: Diagnosis not present

## 2020-02-03 ENCOUNTER — Ambulatory Visit: Payer: Medicare HMO | Admitting: Cardiovascular Disease

## 2020-02-11 ENCOUNTER — Other Ambulatory Visit: Payer: Self-pay

## 2020-02-11 ENCOUNTER — Encounter: Payer: Self-pay | Admitting: Cardiovascular Disease

## 2020-02-11 ENCOUNTER — Ambulatory Visit: Payer: Medicare HMO | Admitting: Cardiovascular Disease

## 2020-02-11 VITALS — BP 146/72 | HR 61 | Ht 66.0 in | Wt 143.0 lb

## 2020-02-11 DIAGNOSIS — I9789 Other postprocedural complications and disorders of the circulatory system, not elsewhere classified: Secondary | ICD-10-CM | POA: Diagnosis not present

## 2020-02-11 DIAGNOSIS — I493 Ventricular premature depolarization: Secondary | ICD-10-CM

## 2020-02-11 DIAGNOSIS — E1159 Type 2 diabetes mellitus with other circulatory complications: Secondary | ICD-10-CM | POA: Diagnosis not present

## 2020-02-11 DIAGNOSIS — E7800 Pure hypercholesterolemia, unspecified: Secondary | ICD-10-CM | POA: Diagnosis not present

## 2020-02-11 DIAGNOSIS — I4891 Unspecified atrial fibrillation: Secondary | ICD-10-CM

## 2020-02-11 DIAGNOSIS — I10 Essential (primary) hypertension: Secondary | ICD-10-CM

## 2020-02-11 DIAGNOSIS — I5032 Chronic diastolic (congestive) heart failure: Secondary | ICD-10-CM

## 2020-02-11 DIAGNOSIS — I350 Nonrheumatic aortic (valve) stenosis: Secondary | ICD-10-CM

## 2020-02-11 DIAGNOSIS — I2510 Atherosclerotic heart disease of native coronary artery without angina pectoris: Secondary | ICD-10-CM

## 2020-02-11 NOTE — Progress Notes (Signed)
Cardiology Office Note    Date:  02/11/2020   ID:  Marissa Jefferson, DOB 03-19-1924, MRN [id number]  PCP:  Merrilee Seashore, MD  Cardiologist: Karry Barrilleaux Electrophysiologist:  None   Evaluation Performed:  Follow-Up Visit  Chief Complaint:  CAD  History of Present Illness:    Marissa Jefferson is a 86 y.o. female with CAD s/p CABG 5993, chronic diastolic heart failure, postoperative atrial fibrillation without subsequent recurrence, essential hypertension, type 2 diabetes mellitus, mixed hyperlipidemia, history of left breast cancer with surgery and radiation.  When she initially presented in 2015 she had severe left ventricular systolic dysfunction with an ejection fraction of 20-25% and episodes of nonsustained ventricular tachycardia.  Following surgery left ventricular systolic function completely recovered to an EF of 55%.  She finds her self moving slower than she did in years past, but she continues to live independently and has to climb 20 steps to her first floor apartment to get in and out.  Her knees hurt when she does this, but she does not have shortness of breath or chest discomfort.  She has not experienced any dizziness or syncope.  The patient specifically denies any chest pain at rest exertion, dyspnea at rest or with exertion, orthopnea, paroxysmal nocturnal dyspnea, syncope, palpitations, focal neurological deficits, intermittent claudication, lower extremity edema, unexplained weight gain, cough, hemoptysis or wheezing.   Past Medical History:  Diagnosis Date   Arthritis    "knees" (10/08/2013)   Breast cancer ([place])    "left; I had 62 sessions of radiation"   CAD (coronary artery disease), native coronary artery 10/06/2013   LAD 90%, CFX 90%, RCA 100% w/ collat   Chronic combined systolic and diastolic CHF, NYHA class 2 ([place]) 09/2013   CVA (cerebral vascular accident) ([place]) 1968   leaving no deficit   DM2 (diabetes mellitus, type 2) ([place])    GERD  (gastroesophageal reflux disease)    Hyperlipidemia    Hypertension    Hypothyroidism    Ischemic dilated cardiomyopathy ([place]) 09/2013   EF 20-25% by echo   NSVT (nonsustained ventricular tachycardia) ([place]) 09/2013   OAB (overactive bladder)    Osteopenia    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;  Service: Open Heart Surgery;  Laterality: [name];   INTRAOPERATIVE TRANSESOPHAGEAL ECHOCARDIOGRAM [name] 10/09/2013   Procedure: INTRAOPERATIVE TRANSESOPHAGEAL ECHOCARDIOGRAM;  Surgeon: Melrose Nakayama, MD;  Location: [facility];  Service: Open Heart Surgery;  Laterality: [name];   LEFT AND RIGHT HEART CATHETERIZATION WITH CORONARY ANGIOGRAM [name] 10/06/2013   Procedure: LEFT AND RIGHT HEART CATHETERIZATION WITH CORONARY ANGIOGRAM;  Surgeon: Sanda Klein, MD;  Location: [facility] CATH [facility];  Service: Cardiovascular;  Laterality: [name];   TONSILLECTOMY AND ADENOIDECTOMY  1930's     Current Meds  Medication Sig   acetaminophen (TYLENOL) 500 MG tablet Take 1,000 mg by mouth 2 (two) times daily.   amLODipine (NORVASC) 10 MG tablet TAKE 1 TABLET BY MOUTH EVERY DAY   aspirin EC 81 MG  tablet Take 1 tablet (81 mg total) by mouth daily.   atorvastatin (LIPITOR) 80 MG tablet Take 1 tablet by mouth every evening.    Biotin 2500 MCG CAPS Take 1 capsule by mouth daily.   carvedilol (COREG) 25 MG tablet TAKE 1 TABLET BY MOUTH TWICE A DAY WITH  MEALS   cholecalciferol (VITAMIN D) 1000 UNITS tablet Take 1,000 Units by mouth daily.   Docusate Calcium (STOOL SOFTENER PO) Take 1 tablet by mouth daily as needed (CONSTIPATION).    levothyroxine (SYNTHROID, LEVOTHROID) 150 MCG tablet Take 150 mcg by mouth daily.   lisinopril (ZESTRIL) 10 MG tablet TAKE 1 TABLET BY MOUTH EVERY DAY   Magnesium Oxide 400 (240 MG) MG TABS TAKE 1 TABLET TWICE DAILY.   metFORMIN (GLUCOPHAGE) 500 MG tablet Take 2 (two) times daily with a meal by mouth.   Multiple Vitamin (MULTIVITAMIN WITH MINERALS) TABS tablet Take 1 tablet by mouth daily.   Nutritional Supplements (BOOST CALORIE SMART PO) Take 1 Can by mouth 2 (two) times daily.   TOVIAZ 4 MG TB24 tablet Take 4 mg 2 (two) times daily by mouth.   VOLTAREN 1 % GEL Apply 1 application topically daily as needed (knee pain).      Allergies:   Patient has no known allergies.   Social History   Tobacco Use   Smoking status: Former Smoker    Packs/day: 0.25    Years: 3.00    Pack years: 0.75    Types: Cigarettes    Quit date: 03/12/1948    Years since quitting: 71.9   Smokeless tobacco: Never Used   Tobacco comment: smoked in college   Substance Use Topics   Alcohol use: No    Alcohol/week: 0.0 standard drinks   Drug use: No     Family Hx: The patient's family history includes Asthma in her daughter and son; Cancer in her father; Colon cancer in her mother; Heart attack in her father.  ROS:   Please see the history of present illness.    All other systems reviewed and are negative.   Prior CV studies:   The following studies were reviewed today:  Labs/Other Tests and Data Reviewed:    EKG: Is ordered today and shows sinus rhythm with first-degree AV block and frequent PVCs (3 monomorphic PVCs on the 10-second tracing, possibly RVOT?),  Right bundle branch block (old), QS pattern in leads V1-V2, no acute repolarization abnormalities  Recent Labs: No results found for requested labs  within last 8760 hours.  12/02/2019 Creatinine 0.77  Recent Lipid Panel Lab Results  Component Value Date/Time   CHOL 135 05/10/2014 02:40 PM   TRIG 138 05/10/2014 02:40 PM   HDL 47 05/10/2014 02:40 PM   CHOLHDL 2.9 05/10/2014 02:40 PM   LDLCALC 60 05/10/2014 02:40 PM   12/02/2019 Total cholesterol 118, HDL 39, LDL 60, triglycerides 102  Wt Readings from Last 3 Encounters:  02/11/20 143 lb (64.9 kg)  01/23/19 142 lb (64.4 kg)  01/23/18 145 lb 6.4 oz (66 kg)     Objective:    Vital Signs:  BP (!) 146/72 (BP Location: Left Arm, Patient Position: Sitting, Cuff Size: Normal)    Pulse 61    Ht 5\' 6"  (1.676 m)    Wt 143 lb (64.9 kg)    BMI 23.08 kg/m     General: Alert, oriented x3, no distress, appears well, but not as agile as before.  Well-healed sternotomy scar Head: no evidence of trauma, PERRL, EOMI, no  exophtalmos or lid lag, no myxedema, no xanthelasma; normal ears, nose and oropharynx Neck: normal jugular venous pulsations and no hepatojugular reflux; brisk carotid pulses without delay and no carotid bruits Chest: clear to auscultation, no signs of consolidation by percussion or palpation, normal fremitus, symmetrical and full respiratory excursions Cardiovascular: normal position and quality of the apical impulse, regular rhythm with occasional ectopic beats, normal first and widely split second heart sounds, no diastolic murmurs, rubs or gallops, 2/6 early to mid peaking systolic ejection murmur heard best in the aortic focus Abdomen: no tenderness or distention, no masses by palpation, no abnormal pulsatility or arterial bruits, normal bowel sounds, no hepatosplenomegaly Extremities: no clubbing, cyanosis or edema; 2+ radial, ulnar and brachial pulses bilaterally; 2+ right femoral, posterior tibial and dorsalis pedis pulses; 2+ left femoral, posterior tibial and dorsalis pedis pulses; no subclavian or femoral bruits Neurological: grossly nonfocal Psych: Normal mood and  affect   ASSESSMENT & PLAN:    1. Coronary artery disease involving native coronary artery of native heart without angina pectoris   2. Chronic diastolic heart failure ([place])   3. 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 Essential hypertension   5. Hypercholesterolemia   6. Controlled type 2 diabetes mellitus with other circulatory complication, without long-term current use of insulin ([place])   7. Postoperative atrial fibrillation ([facility])   8. Nonrheumatic aortic valve stenosis      1. CAD: Now more than 5 years since CABG.  She is fairly sedentary now, but still has to climb 20 steps to reach her second floor apartment and does not develop angina with that. CCS class I.  On aspirin, statin, beta-blocker. 2. CHF: Well compensated, NYHA functional class I, euvolemic off loop diuretics.  Her weight has been very steady in the 142-144 pound range. 3. PVCs: Frequent, but asymptomatic.  She is on beta-blockers and a high dose.  We'll get most recent labs to make sure there is no evidence of an electrolyte abnormality that could be causing this. 4. HTN: Usually well controlled.  Systolic blood pressure was only 130 a month ago at her previous office visit.  Systolic blood pressure slightly high today but no changes were made to her medications. 5. HLP: LDL cholesterol was only 60 on labs from September, well within target range.  Continue atorvastatin. 6. DM: I don't have her most recent hemoglobin A1c, but she told me that Dr. Ashby Dawes was very happy with the improvement in her glycemic control and she thinks she remembers that the hemoglobin A1c was around 7% when checked in August. 7. Postop atrial fibrillation: No clinically evident arrhythmia since the immediate postoperative period. 8. AS: She had borderline aortic stenosis on echo from 2016.  Murmur sounds distinctly louder today.  We'll recheck her echocardiogram.  No symptoms of angina/dyspnea/syncope  with exertion, but her activity level has declined.   Patient Instructions  Medication Instructions:  No changes *If you need a refill on your cardiac medications before your next appointment, please call your pharmacy*   Lab Work: None ordered If you have labs (blood work) drawn today and your tests are completely normal, you will receive your results only by:  [place] (if you have MyChart) OR  A paper copy in the mail If you have any lab test that is abnormal or we need to change your treatment, we will call you to review the results.   Testing/Procedures: Your physician has requested that you have an echocardiogram. Echocardiography  is a painless test that uses sound waves to create images of your heart. It provides your doctor with information about the size and shape of your heart and how well your hearts chambers and valves are working. You may receive an ultrasound enhancing agent through an IV if needed to better visualize your heart during the echo.This procedure takes approximately one hour. There are no restrictions for this procedure. This will take place at the [address].     Follow-Up: At [facility], you and your health needs are our priority.  As part of our continuing mission to provide you with exceptional heart care, we have created designated Provider Care Teams.  These Care Teams include your primary Cardiologist (physician) and Advanced Practice Providers (APPs -  Physician Assistants and Nurse Practitioners) who all work together to provide you with the care you need, when you need it.  We recommend signing up for the patient portal called "MyChart".  Sign up information is provided on this After Visit Summary.  MyChart is used to connect with patients for Virtual Visits (Telemedicine).  Patients are able to view lab/test results, encounter notes, upcoming appointments, etc.  Non-urgent messages can be sent to your provider as well.     To learn more about what you can do with MyChart, go to [email or web address].    Your next appointment:   12 month(s)  The format for your next appointment:   In Person  Provider:   You may see Sanda Klein, MD or one of the following Advanced Practice Providers on your designated Care Team:    Almyra Deforest, PA-C  Fabian Sharp, [place] or   Roby Lofts, PA-C   Signed, Sanda Klein, MD  02/11/2020 11:58 AM    [place]

## 2020-02-11 NOTE — Patient Instructions (Signed)
Medication Instructions:  No changes *If you need a refill on your cardiac medications before your next appointment, please call your pharmacy*   Lab Work: None ordered If you have labs (blood work) drawn today and your tests are completely normal, you will receive your results only by: MyChart Message (if you have MyChart) OR A paper copy in the mail If you have any lab test that is abnormal or we need to change your treatment, we will call you to review the results.   Testing/Procedures: Your physician has requested that you have an echocardiogram. Echocardiography is a painless test that uses sound waves to create images of your heart. It provides your doctor with information about the size and shape of your heart and how well your heart's chambers and valves are working. You may receive an ultrasound enhancing agent through an IV if needed to better visualize your heart during the echo.This procedure takes approximately one hour. There are no restrictions for this procedure. This will take place at the [address].    Follow-Up: At [facility], you and your health needs are our priority.  As part of our continuing mission to provide you with exceptional heart care, we have created designated Provider Care Teams.  These Care Teams include your primary Cardiologist (physician) and Advanced Practice Providers (APPs -  Physician Assistants and Nurse Practitioners) who all work together to provide you with the care you need, when you need it.  We recommend signing up for the patient portal called "MyChart".  Sign up information is provided on this After Visit Summary.  MyChart is used to connect with patients for Virtual Visits (Telemedicine).  Patients are able to view lab/test results, encounter notes, upcoming appointments, etc.  Non-urgent messages can be sent to your provider as well.   To learn more about what you can do with MyChart, go to [email or web address].     Your next appointment:   12 month(s)  The format for your next appointment:   In Person  Provider:   You may see Mihai Croitoru, MD or one of the following Advanced Practice Providers on your designated Care Team:   Hao Meng, PA-C Angela Duke, PA-C or  Krista Kroeger, PA-C   

## 2020-02-16 DIAGNOSIS — Z0100 Encounter for examination of eyes and vision without abnormal findings: Secondary | ICD-10-CM | POA: Diagnosis not present

## 2020-02-26 ENCOUNTER — Other Ambulatory Visit: Payer: Self-pay | Admitting: Cardiovascular Disease

## 2020-03-02 ENCOUNTER — Other Ambulatory Visit (HOSPITAL_COMMUNITY): Payer: Medicare HMO

## 2020-03-17 ENCOUNTER — Telehealth (HOSPITAL_COMMUNITY): Payer: Self-pay | Admitting: Cardiovascular Disease

## 2020-03-17 NOTE — Telephone Encounter (Signed)
Patient cancelled echocardiogram for 03/21/20 on automated system. I called patient to confirm she wanted to cancel and she states she does not want to have the test.  Order will be removed from the WQ. Thank you.

## 2020-03-21 ENCOUNTER — Other Ambulatory Visit (HOSPITAL_COMMUNITY): Payer: Medicare HMO

## 2020-04-09 DIAGNOSIS — I5032 Chronic diastolic (congestive) heart failure: Secondary | ICD-10-CM | POA: Diagnosis not present

## 2020-04-09 DIAGNOSIS — N183 Chronic kidney disease, stage 3 unspecified: Secondary | ICD-10-CM | POA: Diagnosis not present

## 2020-04-09 DIAGNOSIS — I130 Hypertensive heart and chronic kidney disease with heart failure and stage 1 through stage 4 chronic kidney disease, or unspecified chronic kidney disease: Secondary | ICD-10-CM | POA: Diagnosis not present

## 2020-04-09 DIAGNOSIS — E1122 Type 2 diabetes mellitus with diabetic chronic kidney disease: Secondary | ICD-10-CM | POA: Diagnosis not present

## 2020-04-13 DIAGNOSIS — M1990 Unspecified osteoarthritis, unspecified site: Secondary | ICD-10-CM | POA: Diagnosis not present

## 2020-04-13 DIAGNOSIS — I739 Peripheral vascular disease, unspecified: Secondary | ICD-10-CM | POA: Diagnosis not present

## 2020-04-13 DIAGNOSIS — M25461 Effusion, right knee: Secondary | ICD-10-CM | POA: Diagnosis not present

## 2020-04-13 DIAGNOSIS — M25561 Pain in right knee: Secondary | ICD-10-CM | POA: Diagnosis not present

## 2020-04-13 DIAGNOSIS — M25562 Pain in left knee: Secondary | ICD-10-CM | POA: Diagnosis not present

## 2020-04-13 DIAGNOSIS — M1711 Unilateral primary osteoarthritis, right knee: Secondary | ICD-10-CM | POA: Diagnosis not present

## 2020-04-13 DIAGNOSIS — M1129 Other chondrocalcinosis, multiple sites: Secondary | ICD-10-CM | POA: Diagnosis not present

## 2020-04-13 DIAGNOSIS — M1712 Unilateral primary osteoarthritis, left knee: Secondary | ICD-10-CM | POA: Diagnosis not present

## 2020-04-13 DIAGNOSIS — M170 Bilateral primary osteoarthritis of knee: Secondary | ICD-10-CM | POA: Diagnosis not present

## 2020-04-26 DIAGNOSIS — G8929 Other chronic pain: Secondary | ICD-10-CM | POA: Diagnosis not present

## 2020-04-26 DIAGNOSIS — E785 Hyperlipidemia, unspecified: Secondary | ICD-10-CM | POA: Diagnosis not present

## 2020-04-26 DIAGNOSIS — M810 Age-related osteoporosis without current pathological fracture: Secondary | ICD-10-CM | POA: Diagnosis not present

## 2020-04-26 DIAGNOSIS — I2510 Atherosclerotic heart disease of native coronary artery without angina pectoris: Secondary | ICD-10-CM | POA: Diagnosis not present

## 2020-04-26 DIAGNOSIS — I4891 Unspecified atrial fibrillation: Secondary | ICD-10-CM | POA: Diagnosis not present

## 2020-04-26 DIAGNOSIS — M1990 Unspecified osteoarthritis, unspecified site: Secondary | ICD-10-CM | POA: Diagnosis not present

## 2020-04-26 DIAGNOSIS — E119 Type 2 diabetes mellitus without complications: Secondary | ICD-10-CM | POA: Diagnosis not present

## 2020-04-26 DIAGNOSIS — K5900 Constipation, unspecified: Secondary | ICD-10-CM | POA: Diagnosis not present

## 2020-04-26 DIAGNOSIS — I10 Essential (primary) hypertension: Secondary | ICD-10-CM | POA: Diagnosis not present

## 2020-04-26 DIAGNOSIS — E039 Hypothyroidism, unspecified: Secondary | ICD-10-CM | POA: Diagnosis not present

## 2020-05-09 DIAGNOSIS — I130 Hypertensive heart and chronic kidney disease with heart failure and stage 1 through stage 4 chronic kidney disease, or unspecified chronic kidney disease: Secondary | ICD-10-CM | POA: Diagnosis not present

## 2020-05-09 DIAGNOSIS — E1122 Type 2 diabetes mellitus with diabetic chronic kidney disease: Secondary | ICD-10-CM | POA: Diagnosis not present

## 2020-05-09 DIAGNOSIS — N183 Chronic kidney disease, stage 3 unspecified: Secondary | ICD-10-CM | POA: Diagnosis not present

## 2020-05-09 DIAGNOSIS — I5032 Chronic diastolic (congestive) heart failure: Secondary | ICD-10-CM | POA: Diagnosis not present

## 2020-05-17 DIAGNOSIS — I4891 Unspecified atrial fibrillation: Secondary | ICD-10-CM | POA: Diagnosis not present

## 2020-05-17 DIAGNOSIS — I130 Hypertensive heart and chronic kidney disease with heart failure and stage 1 through stage 4 chronic kidney disease, or unspecified chronic kidney disease: Secondary | ICD-10-CM | POA: Diagnosis not present

## 2020-05-17 DIAGNOSIS — I272 Pulmonary hypertension, unspecified: Secondary | ICD-10-CM | POA: Diagnosis not present

## 2020-05-17 DIAGNOSIS — E440 Moderate protein-calorie malnutrition: Secondary | ICD-10-CM | POA: Diagnosis not present

## 2020-05-17 DIAGNOSIS — I2510 Atherosclerotic heart disease of native coronary artery without angina pectoris: Secondary | ICD-10-CM | POA: Diagnosis not present

## 2020-05-17 DIAGNOSIS — I25119 Atherosclerotic heart disease of native coronary artery with unspecified angina pectoris: Secondary | ICD-10-CM | POA: Diagnosis not present

## 2020-05-17 DIAGNOSIS — E7800 Pure hypercholesterolemia, unspecified: Secondary | ICD-10-CM | POA: Diagnosis not present

## 2020-05-17 DIAGNOSIS — I110 Hypertensive heart disease with heart failure: Secondary | ICD-10-CM | POA: Diagnosis not present

## 2020-05-17 DIAGNOSIS — Z951 Presence of aortocoronary bypass graft: Secondary | ICD-10-CM | POA: Diagnosis not present

## 2020-05-17 DIAGNOSIS — N189 Chronic kidney disease, unspecified: Secondary | ICD-10-CM | POA: Diagnosis not present

## 2020-05-17 DIAGNOSIS — K219 Gastro-esophageal reflux disease without esophagitis: Secondary | ICD-10-CM | POA: Diagnosis not present

## 2020-05-17 DIAGNOSIS — M170 Bilateral primary osteoarthritis of knee: Secondary | ICD-10-CM | POA: Diagnosis not present

## 2020-05-17 DIAGNOSIS — N3281 Overactive bladder: Secondary | ICD-10-CM | POA: Diagnosis not present

## 2020-05-17 DIAGNOSIS — I051 Rheumatic mitral insufficiency: Secondary | ICD-10-CM | POA: Diagnosis not present

## 2020-05-17 DIAGNOSIS — Z8673 Personal history of transient ischemic attack (TIA), and cerebral infarction without residual deficits: Secondary | ICD-10-CM | POA: Diagnosis not present

## 2020-05-17 DIAGNOSIS — J9601 Acute respiratory failure with hypoxia: Secondary | ICD-10-CM | POA: Diagnosis not present

## 2020-05-17 DIAGNOSIS — Z853 Personal history of malignant neoplasm of breast: Secondary | ICD-10-CM | POA: Diagnosis not present

## 2020-05-17 DIAGNOSIS — I5032 Chronic diastolic (congestive) heart failure: Secondary | ICD-10-CM | POA: Diagnosis not present

## 2020-05-17 DIAGNOSIS — E038 Other specified hypothyroidism: Secondary | ICD-10-CM | POA: Diagnosis not present

## 2020-05-17 DIAGNOSIS — M8580 Other specified disorders of bone density and structure, unspecified site: Secondary | ICD-10-CM | POA: Diagnosis not present

## 2020-05-17 DIAGNOSIS — E1122 Type 2 diabetes mellitus with diabetic chronic kidney disease: Secondary | ICD-10-CM | POA: Diagnosis not present

## 2020-05-17 DIAGNOSIS — Z7982 Long term (current) use of aspirin: Secondary | ICD-10-CM | POA: Diagnosis not present

## 2020-05-19 DIAGNOSIS — M170 Bilateral primary osteoarthritis of knee: Secondary | ICD-10-CM | POA: Diagnosis not present

## 2020-05-23 DIAGNOSIS — I2510 Atherosclerotic heart disease of native coronary artery without angina pectoris: Secondary | ICD-10-CM | POA: Diagnosis not present

## 2020-05-23 DIAGNOSIS — E1122 Type 2 diabetes mellitus with diabetic chronic kidney disease: Secondary | ICD-10-CM | POA: Diagnosis not present

## 2020-05-23 DIAGNOSIS — I110 Hypertensive heart disease with heart failure: Secondary | ICD-10-CM | POA: Diagnosis not present

## 2020-05-23 DIAGNOSIS — N189 Chronic kidney disease, unspecified: Secondary | ICD-10-CM | POA: Diagnosis not present

## 2020-05-23 DIAGNOSIS — M8580 Other specified disorders of bone density and structure, unspecified site: Secondary | ICD-10-CM | POA: Diagnosis not present

## 2020-05-23 DIAGNOSIS — E440 Moderate protein-calorie malnutrition: Secondary | ICD-10-CM | POA: Diagnosis not present

## 2020-05-23 DIAGNOSIS — E7800 Pure hypercholesterolemia, unspecified: Secondary | ICD-10-CM | POA: Diagnosis not present

## 2020-05-23 DIAGNOSIS — I5032 Chronic diastolic (congestive) heart failure: Secondary | ICD-10-CM | POA: Diagnosis not present

## 2020-05-23 DIAGNOSIS — N3281 Overactive bladder: Secondary | ICD-10-CM | POA: Diagnosis not present

## 2020-05-23 DIAGNOSIS — Z951 Presence of aortocoronary bypass graft: Secondary | ICD-10-CM | POA: Diagnosis not present

## 2020-05-23 DIAGNOSIS — Z8673 Personal history of transient ischemic attack (TIA), and cerebral infarction without residual deficits: Secondary | ICD-10-CM | POA: Diagnosis not present

## 2020-05-23 DIAGNOSIS — M170 Bilateral primary osteoarthritis of knee: Secondary | ICD-10-CM | POA: Diagnosis not present

## 2020-05-23 DIAGNOSIS — Z853 Personal history of malignant neoplasm of breast: Secondary | ICD-10-CM | POA: Diagnosis not present

## 2020-05-23 DIAGNOSIS — I051 Rheumatic mitral insufficiency: Secondary | ICD-10-CM | POA: Diagnosis not present

## 2020-05-23 DIAGNOSIS — Z7982 Long term (current) use of aspirin: Secondary | ICD-10-CM | POA: Diagnosis not present

## 2020-05-23 DIAGNOSIS — I4891 Unspecified atrial fibrillation: Secondary | ICD-10-CM | POA: Diagnosis not present

## 2020-05-23 DIAGNOSIS — E038 Other specified hypothyroidism: Secondary | ICD-10-CM | POA: Diagnosis not present

## 2020-05-23 DIAGNOSIS — K219 Gastro-esophageal reflux disease without esophagitis: Secondary | ICD-10-CM | POA: Diagnosis not present

## 2020-05-25 DIAGNOSIS — Z8673 Personal history of transient ischemic attack (TIA), and cerebral infarction without residual deficits: Secondary | ICD-10-CM | POA: Diagnosis not present

## 2020-05-25 DIAGNOSIS — N3281 Overactive bladder: Secondary | ICD-10-CM | POA: Diagnosis not present

## 2020-05-25 DIAGNOSIS — I4891 Unspecified atrial fibrillation: Secondary | ICD-10-CM | POA: Diagnosis not present

## 2020-05-25 DIAGNOSIS — Z7982 Long term (current) use of aspirin: Secondary | ICD-10-CM | POA: Diagnosis not present

## 2020-05-25 DIAGNOSIS — I110 Hypertensive heart disease with heart failure: Secondary | ICD-10-CM | POA: Diagnosis not present

## 2020-05-25 DIAGNOSIS — I5032 Chronic diastolic (congestive) heart failure: Secondary | ICD-10-CM | POA: Diagnosis not present

## 2020-05-25 DIAGNOSIS — I2510 Atherosclerotic heart disease of native coronary artery without angina pectoris: Secondary | ICD-10-CM | POA: Diagnosis not present

## 2020-05-25 DIAGNOSIS — N189 Chronic kidney disease, unspecified: Secondary | ICD-10-CM | POA: Diagnosis not present

## 2020-05-25 DIAGNOSIS — E038 Other specified hypothyroidism: Secondary | ICD-10-CM | POA: Diagnosis not present

## 2020-05-25 DIAGNOSIS — E440 Moderate protein-calorie malnutrition: Secondary | ICD-10-CM | POA: Diagnosis not present

## 2020-05-25 DIAGNOSIS — I051 Rheumatic mitral insufficiency: Secondary | ICD-10-CM | POA: Diagnosis not present

## 2020-05-25 DIAGNOSIS — E1122 Type 2 diabetes mellitus with diabetic chronic kidney disease: Secondary | ICD-10-CM | POA: Diagnosis not present

## 2020-05-25 DIAGNOSIS — E7800 Pure hypercholesterolemia, unspecified: Secondary | ICD-10-CM | POA: Diagnosis not present

## 2020-05-25 DIAGNOSIS — Z951 Presence of aortocoronary bypass graft: Secondary | ICD-10-CM | POA: Diagnosis not present

## 2020-05-25 DIAGNOSIS — M8580 Other specified disorders of bone density and structure, unspecified site: Secondary | ICD-10-CM | POA: Diagnosis not present

## 2020-05-25 DIAGNOSIS — M170 Bilateral primary osteoarthritis of knee: Secondary | ICD-10-CM | POA: Diagnosis not present

## 2020-05-25 DIAGNOSIS — Z853 Personal history of malignant neoplasm of breast: Secondary | ICD-10-CM | POA: Diagnosis not present

## 2020-05-25 DIAGNOSIS — K219 Gastro-esophageal reflux disease without esophagitis: Secondary | ICD-10-CM | POA: Diagnosis not present

## 2020-05-30 ENCOUNTER — Other Ambulatory Visit: Payer: Self-pay | Admitting: Cardiovascular Disease

## 2020-05-30 DIAGNOSIS — E440 Moderate protein-calorie malnutrition: Secondary | ICD-10-CM | POA: Diagnosis not present

## 2020-05-30 DIAGNOSIS — N189 Chronic kidney disease, unspecified: Secondary | ICD-10-CM | POA: Diagnosis not present

## 2020-05-30 DIAGNOSIS — Z8673 Personal history of transient ischemic attack (TIA), and cerebral infarction without residual deficits: Secondary | ICD-10-CM | POA: Diagnosis not present

## 2020-05-30 DIAGNOSIS — I2510 Atherosclerotic heart disease of native coronary artery without angina pectoris: Secondary | ICD-10-CM | POA: Diagnosis not present

## 2020-05-30 DIAGNOSIS — M170 Bilateral primary osteoarthritis of knee: Secondary | ICD-10-CM | POA: Diagnosis not present

## 2020-05-30 DIAGNOSIS — Z7982 Long term (current) use of aspirin: Secondary | ICD-10-CM | POA: Diagnosis not present

## 2020-05-30 DIAGNOSIS — Z951 Presence of aortocoronary bypass graft: Secondary | ICD-10-CM | POA: Diagnosis not present

## 2020-05-30 DIAGNOSIS — I051 Rheumatic mitral insufficiency: Secondary | ICD-10-CM | POA: Diagnosis not present

## 2020-05-30 DIAGNOSIS — E1122 Type 2 diabetes mellitus with diabetic chronic kidney disease: Secondary | ICD-10-CM | POA: Diagnosis not present

## 2020-05-30 DIAGNOSIS — K219 Gastro-esophageal reflux disease without esophagitis: Secondary | ICD-10-CM | POA: Diagnosis not present

## 2020-05-30 DIAGNOSIS — N3281 Overactive bladder: Secondary | ICD-10-CM | POA: Diagnosis not present

## 2020-05-30 DIAGNOSIS — I4891 Unspecified atrial fibrillation: Secondary | ICD-10-CM | POA: Diagnosis not present

## 2020-05-30 DIAGNOSIS — E038 Other specified hypothyroidism: Secondary | ICD-10-CM | POA: Diagnosis not present

## 2020-05-30 DIAGNOSIS — E7800 Pure hypercholesterolemia, unspecified: Secondary | ICD-10-CM | POA: Diagnosis not present

## 2020-05-30 DIAGNOSIS — M8580 Other specified disorders of bone density and structure, unspecified site: Secondary | ICD-10-CM | POA: Diagnosis not present

## 2020-05-30 DIAGNOSIS — Z853 Personal history of malignant neoplasm of breast: Secondary | ICD-10-CM | POA: Diagnosis not present

## 2020-05-30 DIAGNOSIS — I5032 Chronic diastolic (congestive) heart failure: Secondary | ICD-10-CM | POA: Diagnosis not present

## 2020-05-30 DIAGNOSIS — I110 Hypertensive heart disease with heart failure: Secondary | ICD-10-CM | POA: Diagnosis not present

## 2020-06-01 DIAGNOSIS — K219 Gastro-esophageal reflux disease without esophagitis: Secondary | ICD-10-CM | POA: Diagnosis not present

## 2020-06-01 DIAGNOSIS — I051 Rheumatic mitral insufficiency: Secondary | ICD-10-CM | POA: Diagnosis not present

## 2020-06-01 DIAGNOSIS — I4891 Unspecified atrial fibrillation: Secondary | ICD-10-CM | POA: Diagnosis not present

## 2020-06-01 DIAGNOSIS — N3281 Overactive bladder: Secondary | ICD-10-CM | POA: Diagnosis not present

## 2020-06-01 DIAGNOSIS — E7800 Pure hypercholesterolemia, unspecified: Secondary | ICD-10-CM | POA: Diagnosis not present

## 2020-06-01 DIAGNOSIS — Z8673 Personal history of transient ischemic attack (TIA), and cerebral infarction without residual deficits: Secondary | ICD-10-CM | POA: Diagnosis not present

## 2020-06-01 DIAGNOSIS — E440 Moderate protein-calorie malnutrition: Secondary | ICD-10-CM | POA: Diagnosis not present

## 2020-06-01 DIAGNOSIS — E038 Other specified hypothyroidism: Secondary | ICD-10-CM | POA: Diagnosis not present

## 2020-06-01 DIAGNOSIS — Z7982 Long term (current) use of aspirin: Secondary | ICD-10-CM | POA: Diagnosis not present

## 2020-06-01 DIAGNOSIS — I5032 Chronic diastolic (congestive) heart failure: Secondary | ICD-10-CM | POA: Diagnosis not present

## 2020-06-01 DIAGNOSIS — M170 Bilateral primary osteoarthritis of knee: Secondary | ICD-10-CM | POA: Diagnosis not present

## 2020-06-01 DIAGNOSIS — Z853 Personal history of malignant neoplasm of breast: Secondary | ICD-10-CM | POA: Diagnosis not present

## 2020-06-01 DIAGNOSIS — I110 Hypertensive heart disease with heart failure: Secondary | ICD-10-CM | POA: Diagnosis not present

## 2020-06-01 DIAGNOSIS — E1122 Type 2 diabetes mellitus with diabetic chronic kidney disease: Secondary | ICD-10-CM | POA: Diagnosis not present

## 2020-06-01 DIAGNOSIS — I2510 Atherosclerotic heart disease of native coronary artery without angina pectoris: Secondary | ICD-10-CM | POA: Diagnosis not present

## 2020-06-01 DIAGNOSIS — M8580 Other specified disorders of bone density and structure, unspecified site: Secondary | ICD-10-CM | POA: Diagnosis not present

## 2020-06-01 DIAGNOSIS — Z951 Presence of aortocoronary bypass graft: Secondary | ICD-10-CM | POA: Diagnosis not present

## 2020-06-01 DIAGNOSIS — N189 Chronic kidney disease, unspecified: Secondary | ICD-10-CM | POA: Diagnosis not present

## 2020-06-06 DIAGNOSIS — Z853 Personal history of malignant neoplasm of breast: Secondary | ICD-10-CM | POA: Diagnosis not present

## 2020-06-06 DIAGNOSIS — K219 Gastro-esophageal reflux disease without esophagitis: Secondary | ICD-10-CM | POA: Diagnosis not present

## 2020-06-06 DIAGNOSIS — Z951 Presence of aortocoronary bypass graft: Secondary | ICD-10-CM | POA: Diagnosis not present

## 2020-06-06 DIAGNOSIS — Z8673 Personal history of transient ischemic attack (TIA), and cerebral infarction without residual deficits: Secondary | ICD-10-CM | POA: Diagnosis not present

## 2020-06-06 DIAGNOSIS — I110 Hypertensive heart disease with heart failure: Secondary | ICD-10-CM | POA: Diagnosis not present

## 2020-06-06 DIAGNOSIS — I4891 Unspecified atrial fibrillation: Secondary | ICD-10-CM | POA: Diagnosis not present

## 2020-06-06 DIAGNOSIS — N189 Chronic kidney disease, unspecified: Secondary | ICD-10-CM | POA: Diagnosis not present

## 2020-06-06 DIAGNOSIS — E7800 Pure hypercholesterolemia, unspecified: Secondary | ICD-10-CM | POA: Diagnosis not present

## 2020-06-06 DIAGNOSIS — I051 Rheumatic mitral insufficiency: Secondary | ICD-10-CM | POA: Diagnosis not present

## 2020-06-06 DIAGNOSIS — I2510 Atherosclerotic heart disease of native coronary artery without angina pectoris: Secondary | ICD-10-CM | POA: Diagnosis not present

## 2020-06-06 DIAGNOSIS — I5032 Chronic diastolic (congestive) heart failure: Secondary | ICD-10-CM | POA: Diagnosis not present

## 2020-06-06 DIAGNOSIS — Z7982 Long term (current) use of aspirin: Secondary | ICD-10-CM | POA: Diagnosis not present

## 2020-06-06 DIAGNOSIS — E1122 Type 2 diabetes mellitus with diabetic chronic kidney disease: Secondary | ICD-10-CM | POA: Diagnosis not present

## 2020-06-06 DIAGNOSIS — M170 Bilateral primary osteoarthritis of knee: Secondary | ICD-10-CM | POA: Diagnosis not present

## 2020-06-06 DIAGNOSIS — M8580 Other specified disorders of bone density and structure, unspecified site: Secondary | ICD-10-CM | POA: Diagnosis not present

## 2020-06-06 DIAGNOSIS — E440 Moderate protein-calorie malnutrition: Secondary | ICD-10-CM | POA: Diagnosis not present

## 2020-06-06 DIAGNOSIS — E038 Other specified hypothyroidism: Secondary | ICD-10-CM | POA: Diagnosis not present

## 2020-06-06 DIAGNOSIS — N3281 Overactive bladder: Secondary | ICD-10-CM | POA: Diagnosis not present

## 2020-06-08 DIAGNOSIS — E440 Moderate protein-calorie malnutrition: Secondary | ICD-10-CM | POA: Diagnosis not present

## 2020-06-08 DIAGNOSIS — Z7982 Long term (current) use of aspirin: Secondary | ICD-10-CM | POA: Diagnosis not present

## 2020-06-08 DIAGNOSIS — I2510 Atherosclerotic heart disease of native coronary artery without angina pectoris: Secondary | ICD-10-CM | POA: Diagnosis not present

## 2020-06-08 DIAGNOSIS — I4891 Unspecified atrial fibrillation: Secondary | ICD-10-CM | POA: Diagnosis not present

## 2020-06-08 DIAGNOSIS — Z8673 Personal history of transient ischemic attack (TIA), and cerebral infarction without residual deficits: Secondary | ICD-10-CM | POA: Diagnosis not present

## 2020-06-08 DIAGNOSIS — Z853 Personal history of malignant neoplasm of breast: Secondary | ICD-10-CM | POA: Diagnosis not present

## 2020-06-08 DIAGNOSIS — N3281 Overactive bladder: Secondary | ICD-10-CM | POA: Diagnosis not present

## 2020-06-08 DIAGNOSIS — Z951 Presence of aortocoronary bypass graft: Secondary | ICD-10-CM | POA: Diagnosis not present

## 2020-06-08 DIAGNOSIS — E7800 Pure hypercholesterolemia, unspecified: Secondary | ICD-10-CM | POA: Diagnosis not present

## 2020-06-08 DIAGNOSIS — K219 Gastro-esophageal reflux disease without esophagitis: Secondary | ICD-10-CM | POA: Diagnosis not present

## 2020-06-08 DIAGNOSIS — I5032 Chronic diastolic (congestive) heart failure: Secondary | ICD-10-CM | POA: Diagnosis not present

## 2020-06-08 DIAGNOSIS — M8580 Other specified disorders of bone density and structure, unspecified site: Secondary | ICD-10-CM | POA: Diagnosis not present

## 2020-06-08 DIAGNOSIS — M170 Bilateral primary osteoarthritis of knee: Secondary | ICD-10-CM | POA: Diagnosis not present

## 2020-06-08 DIAGNOSIS — E038 Other specified hypothyroidism: Secondary | ICD-10-CM | POA: Diagnosis not present

## 2020-06-08 DIAGNOSIS — I051 Rheumatic mitral insufficiency: Secondary | ICD-10-CM | POA: Diagnosis not present

## 2020-06-08 DIAGNOSIS — N189 Chronic kidney disease, unspecified: Secondary | ICD-10-CM | POA: Diagnosis not present

## 2020-06-08 DIAGNOSIS — E1122 Type 2 diabetes mellitus with diabetic chronic kidney disease: Secondary | ICD-10-CM | POA: Diagnosis not present

## 2020-06-08 DIAGNOSIS — I110 Hypertensive heart disease with heart failure: Secondary | ICD-10-CM | POA: Diagnosis not present

## 2020-06-10 DIAGNOSIS — I2510 Atherosclerotic heart disease of native coronary artery without angina pectoris: Secondary | ICD-10-CM | POA: Diagnosis not present

## 2020-06-10 DIAGNOSIS — I5032 Chronic diastolic (congestive) heart failure: Secondary | ICD-10-CM | POA: Diagnosis not present

## 2020-06-10 DIAGNOSIS — M170 Bilateral primary osteoarthritis of knee: Secondary | ICD-10-CM | POA: Diagnosis not present

## 2020-06-10 DIAGNOSIS — I110 Hypertensive heart disease with heart failure: Secondary | ICD-10-CM | POA: Diagnosis not present

## 2020-06-13 DIAGNOSIS — I110 Hypertensive heart disease with heart failure: Secondary | ICD-10-CM | POA: Diagnosis not present

## 2020-06-13 DIAGNOSIS — Z853 Personal history of malignant neoplasm of breast: Secondary | ICD-10-CM | POA: Diagnosis not present

## 2020-06-13 DIAGNOSIS — N189 Chronic kidney disease, unspecified: Secondary | ICD-10-CM | POA: Diagnosis not present

## 2020-06-13 DIAGNOSIS — Z951 Presence of aortocoronary bypass graft: Secondary | ICD-10-CM | POA: Diagnosis not present

## 2020-06-13 DIAGNOSIS — I2510 Atherosclerotic heart disease of native coronary artery without angina pectoris: Secondary | ICD-10-CM | POA: Diagnosis not present

## 2020-06-13 DIAGNOSIS — N3281 Overactive bladder: Secondary | ICD-10-CM | POA: Diagnosis not present

## 2020-06-13 DIAGNOSIS — K219 Gastro-esophageal reflux disease without esophagitis: Secondary | ICD-10-CM | POA: Diagnosis not present

## 2020-06-13 DIAGNOSIS — E7800 Pure hypercholesterolemia, unspecified: Secondary | ICD-10-CM | POA: Diagnosis not present

## 2020-06-13 DIAGNOSIS — E440 Moderate protein-calorie malnutrition: Secondary | ICD-10-CM | POA: Diagnosis not present

## 2020-06-13 DIAGNOSIS — E038 Other specified hypothyroidism: Secondary | ICD-10-CM | POA: Diagnosis not present

## 2020-06-13 DIAGNOSIS — Z8673 Personal history of transient ischemic attack (TIA), and cerebral infarction without residual deficits: Secondary | ICD-10-CM | POA: Diagnosis not present

## 2020-06-13 DIAGNOSIS — M8580 Other specified disorders of bone density and structure, unspecified site: Secondary | ICD-10-CM | POA: Diagnosis not present

## 2020-06-13 DIAGNOSIS — I4891 Unspecified atrial fibrillation: Secondary | ICD-10-CM | POA: Diagnosis not present

## 2020-06-13 DIAGNOSIS — I051 Rheumatic mitral insufficiency: Secondary | ICD-10-CM | POA: Diagnosis not present

## 2020-06-13 DIAGNOSIS — M170 Bilateral primary osteoarthritis of knee: Secondary | ICD-10-CM | POA: Diagnosis not present

## 2020-06-13 DIAGNOSIS — Z7982 Long term (current) use of aspirin: Secondary | ICD-10-CM | POA: Diagnosis not present

## 2020-06-13 DIAGNOSIS — E1122 Type 2 diabetes mellitus with diabetic chronic kidney disease: Secondary | ICD-10-CM | POA: Diagnosis not present

## 2020-06-13 DIAGNOSIS — I5032 Chronic diastolic (congestive) heart failure: Secondary | ICD-10-CM | POA: Diagnosis not present

## 2020-06-17 ENCOUNTER — Emergency Department (HOSPITAL_COMMUNITY): Payer: Medicare HMO

## 2020-06-17 ENCOUNTER — Inpatient Hospital Stay (HOSPITAL_COMMUNITY)
Admission: EM | Admit: 2020-06-17 | Discharge: 2020-06-27 | DRG: 291 | Disposition: A | Discharge status: Home Health | Payer: Medicare HMO | Attending: Internal Medicine | Admitting: Internal Medicine

## 2020-06-17 ENCOUNTER — Encounter (HOSPITAL_COMMUNITY): Payer: Self-pay | Admitting: Internal Medicine

## 2020-06-17 ENCOUNTER — Telehealth: Payer: Self-pay | Admitting: Cardiovascular Disease

## 2020-06-17 ENCOUNTER — Other Ambulatory Visit: Payer: Self-pay

## 2020-06-17 DIAGNOSIS — I10 Essential (primary) hypertension: Secondary | ICD-10-CM | POA: Diagnosis not present

## 2020-06-17 DIAGNOSIS — I509 Heart failure, unspecified: Secondary | ICD-10-CM | POA: Insufficient documentation

## 2020-06-17 DIAGNOSIS — J9811 Atelectasis: Secondary | ICD-10-CM | POA: Diagnosis not present

## 2020-06-17 DIAGNOSIS — J811 Chronic pulmonary edema: Secondary | ICD-10-CM | POA: Diagnosis not present

## 2020-06-17 DIAGNOSIS — I5033 Acute on chronic diastolic (congestive) heart failure: Secondary | ICD-10-CM | POA: Diagnosis present

## 2020-06-17 DIAGNOSIS — J9621 Acute and chronic respiratory failure with hypoxia: Secondary | ICD-10-CM | POA: Diagnosis not present

## 2020-06-17 DIAGNOSIS — Z743 Need for continuous supervision: Secondary | ICD-10-CM | POA: Diagnosis not present

## 2020-06-17 DIAGNOSIS — I517 Cardiomegaly: Secondary | ICD-10-CM | POA: Diagnosis not present

## 2020-06-17 DIAGNOSIS — L89151 Pressure ulcer of sacral region, stage 1: Secondary | ICD-10-CM | POA: Diagnosis present

## 2020-06-17 DIAGNOSIS — I5021 Acute systolic (congestive) heart failure: Secondary | ICD-10-CM | POA: Diagnosis not present

## 2020-06-17 DIAGNOSIS — E119 Type 2 diabetes mellitus without complications: Secondary | ICD-10-CM

## 2020-06-17 DIAGNOSIS — Z8249 Family history of ischemic heart disease and other diseases of the circulatory system: Secondary | ICD-10-CM

## 2020-06-17 DIAGNOSIS — E782 Mixed hyperlipidemia: Secondary | ICD-10-CM | POA: Diagnosis present

## 2020-06-17 DIAGNOSIS — E876 Hypokalemia: Secondary | ICD-10-CM | POA: Diagnosis present

## 2020-06-17 DIAGNOSIS — Z79890 Hormone replacement therapy (postmenopausal): Secondary | ICD-10-CM

## 2020-06-17 DIAGNOSIS — I5031 Acute diastolic (congestive) heart failure: Secondary | ICD-10-CM | POA: Diagnosis not present

## 2020-06-17 DIAGNOSIS — Z800 Family history of malignant neoplasm of digestive organs: Secondary | ICD-10-CM | POA: Diagnosis not present

## 2020-06-17 DIAGNOSIS — J90 Pleural effusion, not elsewhere classified: Secondary | ICD-10-CM | POA: Diagnosis not present

## 2020-06-17 DIAGNOSIS — K219 Gastro-esophageal reflux disease without esophagitis: Secondary | ICD-10-CM | POA: Diagnosis present

## 2020-06-17 DIAGNOSIS — R918 Other nonspecific abnormal finding of lung field: Secondary | ICD-10-CM | POA: Diagnosis not present

## 2020-06-17 DIAGNOSIS — I5082 Biventricular heart failure: Secondary | ICD-10-CM | POA: Diagnosis not present

## 2020-06-17 DIAGNOSIS — Z8673 Personal history of transient ischemic attack (TIA), and cerebral infarction without residual deficits: Secondary | ICD-10-CM | POA: Diagnosis not present

## 2020-06-17 DIAGNOSIS — E7800 Pure hypercholesterolemia, unspecified: Secondary | ICD-10-CM | POA: Diagnosis present

## 2020-06-17 DIAGNOSIS — R0602 Shortness of breath: Secondary | ICD-10-CM | POA: Diagnosis not present

## 2020-06-17 DIAGNOSIS — Z825 Family history of asthma and other chronic lower respiratory diseases: Secondary | ICD-10-CM

## 2020-06-17 DIAGNOSIS — J9601 Acute respiratory failure with hypoxia: Secondary | ICD-10-CM | POA: Diagnosis not present

## 2020-06-17 DIAGNOSIS — E8770 Fluid overload, unspecified: Secondary | ICD-10-CM | POA: Diagnosis not present

## 2020-06-17 DIAGNOSIS — N1831 Chronic kidney disease, stage 3a: Secondary | ICD-10-CM | POA: Diagnosis not present

## 2020-06-17 DIAGNOSIS — L8990 Pressure ulcer of unspecified site, unspecified stage: Secondary | ICD-10-CM | POA: Insufficient documentation

## 2020-06-17 DIAGNOSIS — E039 Hypothyroidism, unspecified: Secondary | ICD-10-CM | POA: Diagnosis not present

## 2020-06-17 DIAGNOSIS — Z87891 Personal history of nicotine dependence: Secondary | ICD-10-CM

## 2020-06-17 DIAGNOSIS — Z7984 Long term (current) use of oral hypoglycemic drugs: Secondary | ICD-10-CM

## 2020-06-17 DIAGNOSIS — I493 Ventricular premature depolarization: Secondary | ICD-10-CM | POA: Diagnosis not present

## 2020-06-17 DIAGNOSIS — E1165 Type 2 diabetes mellitus with hyperglycemia: Secondary | ICD-10-CM | POA: Diagnosis present

## 2020-06-17 DIAGNOSIS — I2510 Atherosclerotic heart disease of native coronary artery without angina pectoris: Secondary | ICD-10-CM | POA: Diagnosis present

## 2020-06-17 DIAGNOSIS — Z951 Presence of aortocoronary bypass graft: Secondary | ICD-10-CM | POA: Diagnosis not present

## 2020-06-17 DIAGNOSIS — R001 Bradycardia, unspecified: Secondary | ICD-10-CM | POA: Diagnosis not present

## 2020-06-17 DIAGNOSIS — R0902 Hypoxemia: Secondary | ICD-10-CM | POA: Diagnosis not present

## 2020-06-17 DIAGNOSIS — Z853 Personal history of malignant neoplasm of breast: Secondary | ICD-10-CM | POA: Diagnosis not present

## 2020-06-17 DIAGNOSIS — I2729 Other secondary pulmonary hypertension: Secondary | ICD-10-CM | POA: Diagnosis present

## 2020-06-17 DIAGNOSIS — Z7982 Long term (current) use of aspirin: Secondary | ICD-10-CM | POA: Diagnosis not present

## 2020-06-17 DIAGNOSIS — M170 Bilateral primary osteoarthritis of knee: Secondary | ICD-10-CM | POA: Diagnosis present

## 2020-06-17 DIAGNOSIS — I110 Hypertensive heart disease with heart failure: Secondary | ICD-10-CM | POA: Diagnosis not present

## 2020-06-17 DIAGNOSIS — Z888 Allergy status to other drugs, medicaments and biological substances status: Secondary | ICD-10-CM

## 2020-06-17 DIAGNOSIS — R0689 Other abnormalities of breathing: Secondary | ICD-10-CM | POA: Diagnosis not present

## 2020-06-17 DIAGNOSIS — E11649 Type 2 diabetes mellitus with hypoglycemia without coma: Secondary | ICD-10-CM | POA: Diagnosis not present

## 2020-06-17 DIAGNOSIS — E1122 Type 2 diabetes mellitus with diabetic chronic kidney disease: Secondary | ICD-10-CM | POA: Diagnosis not present

## 2020-06-17 DIAGNOSIS — E785 Hyperlipidemia, unspecified: Secondary | ICD-10-CM | POA: Diagnosis not present

## 2020-06-17 DIAGNOSIS — Z79899 Other long term (current) drug therapy: Secondary | ICD-10-CM

## 2020-06-17 DIAGNOSIS — I5032 Chronic diastolic (congestive) heart failure: Secondary | ICD-10-CM

## 2020-06-17 DIAGNOSIS — Z20822 Contact with and (suspected) exposure to covid-19: Secondary | ICD-10-CM | POA: Diagnosis not present

## 2020-06-17 DIAGNOSIS — M8580 Other specified disorders of bone density and structure, unspecified site: Secondary | ICD-10-CM | POA: Diagnosis present

## 2020-06-17 DIAGNOSIS — I5022 Chronic systolic (congestive) heart failure: Secondary | ICD-10-CM | POA: Diagnosis not present

## 2020-06-17 DIAGNOSIS — R000 Tachycardia, unspecified: Secondary | ICD-10-CM | POA: Diagnosis not present

## 2020-06-17 HISTORY — DX: Heart failure, unspecified: I50.9

## 2020-06-17 LAB — BASIC METABOLIC PANEL
Anion gap: 9 (ref 5–15)
BUN: 21 mg/dL (ref 8–23)
CO2: 26 mmol/L (ref 22–32)
Calcium: 10.1 mg/dL (ref 8.9–10.3)
Chloride: 98 mmol/L (ref 98–111)
Creatinine, Ser: 0.92 mg/dL (ref 0.44–1.00)
GFR, Estimated: 58 mL/min — ABNORMAL LOW (ref >60)
Glucose, Bld: 267 mg/dL — ABNORMAL HIGH (ref 70–99)
Potassium: 4.7 mmol/L (ref 3.5–5.1)
Sodium: 133 mmol/L — ABNORMAL LOW (ref 135–145)

## 2020-06-17 LAB — CBC WITH DIFFERENTIAL/PLATELET
Abs Immature Granulocytes: 0.02 10*3/uL (ref 0.00–0.07)
Basophils Absolute: 0.1 10*3/uL (ref 0.0–0.1)
Basophils Relative: 1 %
Eosinophils Absolute: 0.2 10*3/uL (ref 0.0–0.5)
Eosinophils Relative: 2 %
HCT: 53.4 % — ABNORMAL HIGH (ref 36.0–46.0)
Hemoglobin: 17.4 g/dL — ABNORMAL HIGH (ref 12.0–15.0)
Immature Granulocytes: 0 %
Lymphocytes Relative: 7 %
Lymphs Abs: 0.7 10*3/uL (ref 0.7–4.0)
MCH: 34 pg (ref 26.0–34.0)
MCHC: 32.6 g/dL (ref 30.0–36.0)
MCV: 104.3 fL — ABNORMAL HIGH (ref 80.0–100.0)
Monocytes Absolute: 0.6 10*3/uL (ref 0.1–1.0)
Monocytes Relative: 7 %
Neutro Abs: 7.7 10*3/uL (ref 1.7–7.7)
Neutrophils Relative %: 83 %
Platelets: 209 10*3/uL (ref 150–400)
RBC: 5.12 MIL/uL — ABNORMAL HIGH (ref 3.87–5.11)
RDW: 13.8 % (ref 11.5–15.5)
WBC: 9.3 10*3/uL (ref 4.0–10.5)
nRBC: 0 % (ref 0.0–0.2)

## 2020-06-17 LAB — TROPONIN I (HIGH SENSITIVITY)
Troponin I (High Sensitivity): 33 ng/L — ABNORMAL HIGH (ref <18)
Troponin I (High Sensitivity): 28 ng/L — ABNORMAL HIGH (ref <18)

## 2020-06-17 LAB — CBG MONITORING, ED: Glucose-Capillary: 171 mg/dL — ABNORMAL HIGH (ref 70–99)

## 2020-06-17 LAB — GLUCOSE, CAPILLARY
Glucose-Capillary: 207 mg/dL — ABNORMAL HIGH (ref 70–99)
Glucose-Capillary: 212 mg/dL — ABNORMAL HIGH (ref 70–99)

## 2020-06-17 LAB — RESP PANEL BY RT-PCR (FLU A&B, COVID) ARPGX2
Influenza A by PCR: NEGATIVE
Influenza B by PCR: NEGATIVE
SARS Coronavirus 2 by RT PCR: NEGATIVE

## 2020-06-17 LAB — BRAIN NATRIURETIC PEPTIDE: B Natriuretic Peptide: 892.4 pg/mL — ABNORMAL HIGH (ref 0.0–100.0)

## 2020-06-17 LAB — HEMOGLOBIN A1C
Hgb A1c MFr Bld: 8.2 % — ABNORMAL HIGH (ref 4.8–5.6)
Mean Plasma Glucose: 188.64 mg/dL

## 2020-06-17 LAB — TSH: TSH: 2.175 u[IU]/mL (ref 0.350–4.500)

## 2020-06-17 MED ORDER — BIOTIN 2500 MCG PO CAPS: 1.0000 | ORAL_CAPSULE | Freq: Every day | ORAL | Status: DC

## 2020-06-17 MED ORDER — DOCUSATE SODIUM 100 MG PO CAPS
100.0000 mg | ORAL_CAPSULE | Freq: Every day | ORAL | Status: DC | PRN
  Administered 2020-06-27: 100 mg via ORAL
  Filled 2020-06-17: qty 1

## 2020-06-17 MED ORDER — MAGNESIUM OXIDE 400 (241.3 MG) MG PO TABS
400.0000 mg | ORAL_TABLET | Freq: Two times a day (BID) | ORAL | Status: DC
  Administered 2020-06-17 – 2020-06-27 (×20): 400 mg via ORAL
  Filled 2020-06-17 (×20): qty 1

## 2020-06-17 MED ORDER — FUROSEMIDE 10 MG/ML IJ SOLN
40.0000 mg | Freq: Once | INTRAMUSCULAR | Status: AC
  Administered 2020-06-17: 40 mg via INTRAVENOUS
  Filled 2020-06-17: qty 4

## 2020-06-17 MED ORDER — ACETAMINOPHEN 500 MG PO TABS: 1000.0000 mg | ORAL_TABLET | Freq: Two times a day (BID) | ORAL | Status: DC

## 2020-06-17 MED ORDER — ACETAMINOPHEN 325 MG PO TABS
650.0000 mg | ORAL_TABLET | ORAL | Status: DC | PRN
  Administered 2020-06-18 – 2020-06-27 (×7): 650 mg via ORAL
  Filled 2020-06-17 (×8): qty 2

## 2020-06-17 MED ORDER — INSULIN ASPART 100 UNIT/ML ~~LOC~~ SOLN
0.0000 [IU] | Freq: Three times a day (TID) | SUBCUTANEOUS | Status: DC
  Administered 2020-06-17: 5 [IU] via SUBCUTANEOUS
  Administered 2020-06-18 (×2): 3 [IU] via SUBCUTANEOUS
  Administered 2020-06-18: 2 [IU] via SUBCUTANEOUS
  Administered 2020-06-19: 5 [IU] via SUBCUTANEOUS
  Administered 2020-06-19 – 2020-06-20 (×3): 3 [IU] via SUBCUTANEOUS
  Administered 2020-06-20: 5 [IU] via SUBCUTANEOUS
  Administered 2020-06-21 (×3): 3 [IU] via SUBCUTANEOUS
  Administered 2020-06-22: 2 [IU] via SUBCUTANEOUS
  Administered 2020-06-22 (×2): 3 [IU] via SUBCUTANEOUS
  Administered 2020-06-23: 5 [IU] via SUBCUTANEOUS
  Administered 2020-06-23: 2 [IU] via SUBCUTANEOUS
  Administered 2020-06-23: 5 [IU] via SUBCUTANEOUS
  Administered 2020-06-24: 8 [IU] via SUBCUTANEOUS
  Administered 2020-06-24 (×2): 2 [IU] via SUBCUTANEOUS
  Administered 2020-06-25: 8 [IU] via SUBCUTANEOUS
  Administered 2020-06-25: 3 [IU] via SUBCUTANEOUS
  Administered 2020-06-25: 5 [IU] via SUBCUTANEOUS
  Administered 2020-06-26: 8 [IU] via SUBCUTANEOUS
  Administered 2020-06-26: 3 [IU] via SUBCUTANEOUS
  Administered 2020-06-26: 2 [IU] via SUBCUTANEOUS
  Administered 2020-06-27: 5 [IU] via SUBCUTANEOUS
  Administered 2020-06-27 (×2): 3 [IU] via SUBCUTANEOUS

## 2020-06-17 MED ORDER — ATORVASTATIN CALCIUM 80 MG PO TABS
80.0000 mg | ORAL_TABLET | Freq: Every evening | ORAL | Status: DC
  Administered 2020-06-18 – 2020-06-27 (×10): 80 mg via ORAL
  Filled 2020-06-17 (×11): qty 1

## 2020-06-17 MED ORDER — MAGNESIUM OXIDE -MG SUPPLEMENT 400 (240 MG) MG PO TABS
1.0000 | ORAL_TABLET | Freq: Two times a day (BID) | ORAL | Status: DC
  Filled 2020-06-17: qty 1

## 2020-06-17 MED ORDER — ENOXAPARIN SODIUM 30 MG/0.3ML ~~LOC~~ SOLN: 30.0000 mg | SUBCUTANEOUS | Status: DC

## 2020-06-17 MED ORDER — ONDANSETRON HCL 4 MG/2ML IJ SOLN
4.0000 mg | Freq: Four times a day (QID) | INTRAMUSCULAR | Status: DC | PRN
Start: 2020-06-17 — End: 2020-06-27

## 2020-06-17 MED ORDER — HYDRALAZINE HCL 25 MG PO TABS: 25.0000 mg | ORAL_TABLET | Freq: Four times a day (QID) | ORAL | Status: DC | PRN

## 2020-06-17 MED ORDER — SODIUM CHLORIDE 0.9 % IV SOLN: 250.0000 mL | INTRAVENOUS | Status: DC | PRN

## 2020-06-17 MED ORDER — FUROSEMIDE 10 MG/ML IJ SOLN
40.0000 mg | Freq: Every day | INTRAMUSCULAR | Status: DC
  Administered 2020-06-18: 40 mg via INTRAVENOUS
  Filled 2020-06-17: qty 4

## 2020-06-17 MED ORDER — ENOXAPARIN SODIUM 40 MG/0.4ML ~~LOC~~ SOLN
40.0000 mg | SUBCUTANEOUS | Status: DC
  Administered 2020-06-17 – 2020-06-27 (×11): 40 mg via SUBCUTANEOUS
  Filled 2020-06-17 (×11): qty 0.4

## 2020-06-17 MED ORDER — VITAMIN D 25 MCG (1000 UNIT) PO TABS
5000.0000 [IU] | ORAL_TABLET | Freq: Every day | ORAL | Status: DC
  Administered 2020-06-18 – 2020-06-27 (×10): 5000 [IU] via ORAL
  Filled 2020-06-17 (×11): qty 5

## 2020-06-17 MED ORDER — SODIUM CHLORIDE 0.9% FLUSH: 3.0000 mL | INTRAVENOUS | Status: DC | PRN

## 2020-06-17 MED ORDER — DARIFENACIN HYDROBROMIDE ER 15 MG PO TB24
15.0000 mg | ORAL_TABLET | Freq: Every day | ORAL | Status: DC
  Administered 2020-06-18 – 2020-06-27 (×10): 15 mg via ORAL
  Filled 2020-06-17 (×10): qty 1

## 2020-06-17 MED ORDER — DICLOFENAC SODIUM 1 % EX GEL
1.0000 "application " | Freq: Every day | CUTANEOUS | Status: DC | PRN
  Filled 2020-06-17: qty 100

## 2020-06-17 MED ORDER — FESOTERODINE FUMARATE ER 4 MG PO TB24: 4.0000 mg | ORAL_TABLET | Freq: Two times a day (BID) | ORAL | Status: DC

## 2020-06-17 MED ORDER — ADULT MULTIVITAMIN W/MINERALS CH: 1.0000 | ORAL_TABLET | Freq: Every day | ORAL | Status: DC

## 2020-06-17 MED ORDER — LEVOTHYROXINE SODIUM 75 MCG PO TABS
150.0000 ug | ORAL_TABLET | Freq: Every day | ORAL | Status: DC
  Administered 2020-06-18 – 2020-06-27 (×10): 150 ug via ORAL
  Filled 2020-06-17 (×10): qty 2

## 2020-06-17 MED ORDER — METFORMIN HCL 500 MG PO TABS
500.0000 mg | ORAL_TABLET | Freq: Two times a day (BID) | ORAL | Status: DC
  Administered 2020-06-17 – 2020-06-18 (×2): 500 mg via ORAL
  Filled 2020-06-17 (×2): qty 1

## 2020-06-17 MED ORDER — LISINOPRIL 10 MG PO TABS
10.0000 mg | ORAL_TABLET | Freq: Every day | ORAL | Status: DC
  Administered 2020-06-18: 10 mg via ORAL
  Filled 2020-06-17: qty 1

## 2020-06-17 MED ORDER — CARVEDILOL 25 MG PO TABS
25.0000 mg | ORAL_TABLET | Freq: Two times a day (BID) | ORAL | Status: DC
  Administered 2020-06-17 – 2020-06-26 (×19): 25 mg via ORAL
  Filled 2020-06-17 (×21): qty 1

## 2020-06-17 MED ORDER — SODIUM CHLORIDE 0.9% FLUSH
3.0000 mL | Freq: Two times a day (BID) | INTRAVENOUS | Status: DC
  Administered 2020-06-18 – 2020-06-27 (×18): 3 mL via INTRAVENOUS

## 2020-06-17 MED ORDER — ASPIRIN EC 81 MG PO TBEC
81.0000 mg | DELAYED_RELEASE_TABLET | Freq: Every day | ORAL | Status: DC
  Administered 2020-06-18 – 2020-06-27 (×10): 81 mg via ORAL
  Filled 2020-06-17 (×10): qty 1

## 2020-06-17 NOTE — Progress Notes (Signed)
Placed pt on bipap through the dreamstation but pt did not tolerate. Pt refused to wear. RN aware. Pt placed back on 6L [place]

## 2020-06-17 NOTE — Telephone Encounter (Signed)
Pt c/o medication issue:  1. Name of Medication:  lisinopril (ZESTRIL) 10 MG tablet amLODipine (NORVASC) 10 MG tablet   2. How are you currently taking this medication (dosage and times per day)?   3. Are you having a reaction (difficulty breathing--STAT)?   4. What is your medication issue? Pharmacy wanted to know if the patient should be taking both medications simultaneously. Please advise

## 2020-06-17 NOTE — Progress Notes (Addendum)
RT Removed pt from BiPAP and placed pt on 8LNC, per MD. Pt tolerating well at this time with SVS.

## 2020-06-17 NOTE — H&P (Signed)
History and Physical    Marissa Jefferson IWL:[id number] DOB: 08-Dec-1924 DOA: 06/17/2020  PCP: Merrilee Seashore, MD (Confirm with patient/family/NH records and if not entered, this has to be entered at [facility] point of entry) Patient coming from: Home  I have personally briefly reviewed patient's old medical records in [place]  Chief Complaint: SOB  HPI: Marissa Jefferson is a 87 y.o. female with medical history significant of chronic diastolic CHF, HTN, HLD, CAD status post triple bypass, hypothyroidism, IIDM, presented with increasing shortness of breath for 2 weeks.  Symptoms started about 2 weeks ago and gradually getting worse.  Initially was exertional and cardiac getting worse, then developed constant shortness of breath 2 days ago.  Home physical therapy visited patient yesterday and checked her pulse ox and could not get any reading.  Developed orthopnea at night.  Denies any cough no chest pain.  She also noticed " shoes fit no more".  Denies any fever chills. ED Course: EMS found patient O2 saturation in the 70s improved to 80s on 5 L.  ED switch patient to BiPAP which did work well, urinated about 700, after 1 dose of Lasix given.  Chest x-ray showed pulmonary edema.  Review of Systems: As per HPI otherwise 14 point review of systems negative.    Past Medical History:  Diagnosis Date  . Arthritis    "knees" (10/08/2013)  . Breast cancer ([place])    "left; I had 62 sessions of radiation"  . CAD (coronary artery disease), native coronary artery 10/06/2013   LAD 90%, CFX 90%, RCA 100% w/ collat  . Chronic combined systolic and diastolic CHF, NYHA class 2 ([place]) 09/2013  . CVA (cerebral vascular accident) ([place]) 1968   leaving no deficit  . DM2 (diabetes mellitus, type 2) ([place])   . GERD (gastroesophageal reflux disease)   . Hyperlipidemia   . Hypertension   . Hypothyroidism   . Ischemic dilated cardiomyopathy ([place]) 09/2013   EF 20-25% by echo  . NSVT (nonsustained ventricular  tachycardia) ([place]) 09/2013  . OAB (overactive bladder)   . Osteopenia   . 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 BREAST LUMPECTOMY Left 1998  . BREAST LUMPECTOMY WITH AXILLARY LYMPH NODE DISSECTION Left 1998  . CARDIAC CATHETERIZATION  10/06/2013  . CATARACT EXTRACTION W/ INTRAOCULAR LENS  IMPLANT, BILATERAL Bilateral ~ 2009  . COLONOSCOPY  2005  . CORONARY ARTERY BYPASS GRAFT [name] 10/09/2013   Procedure: CORONARY ARTERY BYPASS GRAFTING (CABG) x 4 using left internal mammary artery and right saphenous leg vein using endoscope.;  Surgeon: Melrose Nakayama, MD;  Location: [facility];  Service: Open Heart Surgery;  Laterality: [name];  . INTRAOPERATIVE TRANSESOPHAGEAL ECHOCARDIOGRAM [name] 10/09/2013   Procedure: INTRAOPERATIVE TRANSESOPHAGEAL ECHOCARDIOGRAM;  Surgeon: Melrose Nakayama, MD;  Location: [facility];  Service: Open Heart Surgery;  Laterality: [name];  . LEFT AND RIGHT HEART CATHETERIZATION WITH CORONARY ANGIOGRAM [name] 10/06/2013   Procedure: LEFT AND RIGHT HEART CATHETERIZATION WITH CORONARY ANGIOGRAM;  Surgeon: Sanda Klein, MD;  Location: [facility] CATH LAB;  Service: Cardiovascular;  Laterality: [name];  . TONSILLECTOMY AND ADENOIDECTOMY  1930's     reports that she quit smoking about 72 years ago. Her smoking use included cigarettes. She has a 0.75 pack-year smoking history. She has never used smokeless tobacco. She reports that she does not drink alcohol and does not use drugs.  Allergies  Allergen Reactions  . Amiodarone Other (See Comments)  Bradycardia  . Crestor [Rosuvastatin] Nausea Only  . Sitagliptin     Urine frequency    Family History  Problem Relation Age of Onset  . Colon cancer Mother   . Cancer Father        ? type  . Heart attack Father   . Asthma Son   . Asthma Daughter      Prior to Admission medications   Medication Sig Start Date End Date Taking? Authorizing  Provider  atorvastatin (LIPITOR) 80 MG tablet Take 80 mg by mouth daily. 10/04/13  Yes [provider]  levothyroxine (SYNTHROID, LEVOTHROID) 150 MCG tablet Take 150 mcg by mouth daily before breakfast. 12/06/14  Yes [provider]  lisinopril (ZESTRIL) 10 MG tablet TAKE 1 TABLET BY MOUTH EVERY DAY Patient taking differently: Take 10 mg by mouth daily. 10/02/19  Yes Croitoru, Mihai, MD  Magnesium Oxide 400 (240 MG) MG TABS TAKE 1 TABLET TWICE DAILY. Patient taking differently: Take 400 mg by mouth 2 (two) times daily. 12/22/13  Yes Croitoru, Mihai, MD  metFORMIN (GLUCOPHAGE) 500 MG tablet Take 1,000 mg by mouth 2 (two) times daily.   Yes [provider]  solifenacin (VESICARE) 10 MG tablet Take 10 mg by mouth at bedtime.   Yes [provider]  VOLTAREN 1 % GEL Apply 2 g topically daily as needed (for bilateral knee pain). 09/02/13  Yes [provider]  acetaminophen (TYLENOL) 500 MG tablet Take 1,000 mg by mouth in the morning and at bedtime. Add 500 pgn    [provider]  amLODipine (NORVASC) 10 MG tablet TAKE 1 TABLET BY MOUTH EVERY DAY 02/26/20   Croitoru, Mihai, MD  aspirin EC 81 MG tablet Take 1 tablet (81 mg total) by mouth daily. 01/23/19   Croitoru, Mihai, MD  Biotin 2500 MCG CAPS Take 1 capsule by mouth daily.    [provider]  carvedilol (COREG) 25 MG tablet TAKE 1 TABLET BY MOUTH TWICE A DAY WITH MEALS 05/30/20   Croitoru, Mihai, MD  cholecalciferol (VITAMIN D) 1000 UNITS tablet Take 1,000 Units by mouth daily.    [provider]  docusate sodium (COLACE) 100 MG capsule Take 100 mg by mouth See admin instructions. Take 100 mg by mouth at bedtime and HOLD FOR DIARRHEA    [provider]  Multiple Vitamin (MULTIVITAMIN WITH MINERALS) TABS tablet Take 1 tablet by mouth daily.    [provider]  Nutritional Supplements (BOOST CALORIE SMART PO) Take 1 Can by mouth 2 (two) times daily.    [provider]  TOVIAZ 4 MG TB24 tablet Take 4 mg 2 (two) times daily by mouth. 12/31/16   [provider]  traMADol (ULTRAM) 50 MG tablet Take 50 mg by mouth 3 (three) times daily as needed. 05/24/20   [provider]    Physical Exam: Vitals:   06/17/20 1215 06/17/20 1230 06/17/20 1355 06/17/20 1452  BP:  116/66 128/72 134/70  Pulse: (!) 59 (!) 59 66 63  Resp: 13 16 16 20   Temp:      TempSrc:      SpO2: 94% 94% 95% 92%    Constitutional: NAD, calm, comfortable Vitals:   06/17/20 1215 06/17/20 1230 06/17/20 1355 06/17/20 1452  BP:  116/66 128/72 134/70  Pulse: (!) 59 (!) 59 66 63  Resp: 13 16 16 20   Temp:      TempSrc:      SpO2: 94% 94% 95% 92%   Eyes: PERRL, lids  and conjunctivae normal ENMT: Mucous membranes are moist. Posterior pharynx clear of any exudate or lesions.Normal dentition.  Neck: normal, supple, no masses, no thyromegaly. JVD about 7 cm above clavicle Respiratory: clear to auscultation bilaterally, no wheezing, fine crackles on B/L bases.  Talking in broken sentences, increasing respiratory effort. No accessory muscle use.  Cardiovascular: Regular rate and rhythm, mild systolic murmur on heart base. 2+ extremity edema. 2+ pedal pulses. No carotid bruits.  Abdomen: no tenderness, no masses palpated. No hepatosplenomegaly. Bowel sounds positive.  Musculoskeletal: no clubbing / cyanosis. No joint deformity upper and lower extremities. Good ROM, no contractures. Normal muscle tone.  Skin: no rashes, lesions, ulcers. No induration Neurologic: CN 2-12 grossly intact. Sensation intact, DTR normal. Strength 5/5 in all 4.  Psychiatric: Normal judgment and insight. Alert and oriented x 3. Normal mood.     Labs on Admission: I have personally reviewed following labs and imaging studies  CBC: Recent Labs  Lab 06/17/20 1126  WBC 9.3  NEUTROABS 7.7  HGB 17.4*  HCT 53.4*  MCV 104.3*  PLT 850   Basic Metabolic Panel: Recent Labs  Lab  06/17/20 1126  NA 133*  K 4.7  CL 98  CO2 26  GLUCOSE 267*  BUN 21  CREATININE 0.92  CALCIUM 10.1   GFR: CrCl cannot be calculated (Unknown ideal weight.). Liver Function Tests: No results for input(s): AST, ALT, ALKPHOS, BILITOT, PROT, ALBUMIN in the last 168 hours. No results for input(s): LIPASE, AMYLASE in the last 168 hours. No results for input(s): AMMONIA in the last 168 hours. Coagulation Profile: No results for input(s): INR, PROTIME in the last 168 hours. Cardiac Enzymes: No results for input(s): CKTOTAL, CKMB, CKMBINDEX, TROPONINI in the last 168 hours. BNP (last 3 results) No results for input(s): PROBNP in the last 8760 hours. HbA1C: No results for input(s): HGBA1C in the last 72 hours. CBG: No results for input(s): GLUCAP in the last 168 hours. Lipid Profile: No results for input(s): CHOL, HDL, LDLCALC, TRIG, CHOLHDL, LDLDIRECT in the last 72 hours. Thyroid Function Tests: No results for input(s): TSH, T4TOTAL, FREET4, T3FREE, THYROIDAB in the last 72 hours. Anemia Panel: No results for input(s): VITAMINB12, FOLATE, FERRITIN, TIBC, IRON, RETICCTPCT in the last 72 hours. Urine analysis:    Component Value Date/Time   COLORURINE AMBER (A) 03/22/2014 1509   APPEARANCEUR CLOUDY (A) 03/22/2014 1509   LABSPEC 1.024 03/22/2014 1509   PHURINE 5.0 03/22/2014 1509   GLUCOSEU NEGATIVE 03/22/2014 1509   HGBUR NEGATIVE 03/22/2014 1509   BILIRUBINUR SMALL (A) 03/22/2014 1509   KETONESUR 15 (A) 03/22/2014 1509   PROTEINUR 30 (A) 03/22/2014 1509   UROBILINOGEN 0.2 03/22/2014 1509   NITRITE NEGATIVE 03/22/2014 1509   LEUKOCYTESUR SMALL (A) 03/22/2014 1509    Radiological Exams on Admission: DG Chest Portable 1 View  Result Date: 06/17/2020 CLINICAL DATA:  Short of breath, hypoxia, history of CHF EXAM: PORTABLE CHEST 1 VIEW COMPARISON:  Prior chest x-ray 03/29/2015 FINDINGS: Enlarged cardiopericardial silhouette consistent with cardiomegaly. Atherosclerotic  calcifications visualized in the transverse aorta. Patient is status post median sternotomy for multivessel CABG. Bibasilar airspace opacities are present with blunting of the costophrenic angles consistent with a combination of layering pleural fluid with atelectasis and/or infiltrate. Mild vascular congestion without overt edema. Chronic bronchitic changes and interstitial prominence are similar compared to relatively remote prior imaging. No pneumothorax. Surgical clips again seen in the left axilla. No acute osseous abnormality. IMPRESSION: 1. Cardiomegaly and pulmonary vascular congestion bordering on mild edema.  2. Bilateral moderate layering pleural effusions with associated bibasilar atelectasis and/or infiltrate. 3. Aortic atherosclerosis. Electronically Signed   By: Jacqulynn Cadet M.D.   On: 06/17/2020 12:41    EKG: Independently reviewed.  Sinus, chronic RBBB  Assessment/Plan Active Problems:   Acute CHF (congestive heart failure) (HCC)   CHF (congestive heart failure) ([place])  (please populate well all problems here in Problem List. (For example, if patient is on BP meds at home and you resume or decide to hold them, it is a problem that needs to be her. Same for CAD, COPD, HLD and so on)  Acute hypoxic respiratory failure, secondary to acute on chronic diastolic CHF decompensation -Significant fluid overload, however her body weight has remained same of 64.9 kg today as well as in December 2021.  And her blood pressure appears to be fairly controlled as well.  Thus raise the concern about whether there is any significant LVEF changes versus some other etiology such as valvular issue.  Ordered echocardiogram. -Discontinue amlodipine for swelling of the legs, continue IV Lasix daily.  Consider consult cardiology if significant finding on echocardiogram. -TSH  HTN -as above  IIDM with hyperglycemia -Continue Metformin, add sliding scale  Hypothyroidism -Continue Synthroid, check  TSH.  Code status -Discussed with patient at bedside, patient wants to discuss the issue with her daughter/POA before making decisions.  Then communicate with patient daughter over the phone, who confirmed a similar plan of talking to patient before making final decisions.   DVT prophylaxis: Lovenox Code Status: Full Code Family Communication: Daughter over the phone Disposition Plan: Expect 2 to 3 days hospital stay for aggressive diuresis. Consults called: None Admission status: Tele admit   Lequita Halt MD [facility] Pager [phone]  06/17/2020, 4:13 PM

## 2020-06-17 NOTE — Telephone Encounter (Signed)
Will route this message to Dr. Victorino December Primary Covering RN for further review and follow-up with the pts Pharmacy.

## 2020-06-17 NOTE — ED Notes (Signed)
Attempted to give reportx1 

## 2020-06-17 NOTE — ED Triage Notes (Signed)
Pt BIB [facility] from a doctors office for SOB/hypoxia.  Pt was 72% on RA and 80% on 5L [place].  Pt was 92% on CPAP on arrival with EMS. Pt has hx of CHF and was successful with appropriate diet and oral lasix until the last couple of weeks.  Pt has experienced loss of best friend and has been struggling with some failure to thrive according to Son-in-law who has work hx as Manufacturing systems engineer.

## 2020-06-17 NOTE — ED Provider Notes (Signed)
[facility] EMERGENCY DEPARTMENT Provider Note   CSN: [id number] Arrival date & time: 06/17/20  1118     History Chief Complaint  Patient presents with  . Shortness of Breath    Marissa Jefferson is a 87 y.o. female.   Shortness of Breath Severity:  Severe Onset quality:  Gradual Timing:  Constant Progression:  Worsening Chronicity:  New Relieved by:  Oxygen Worsened by:  Nothing Ineffective treatments:  None tried Associated symptoms: no chest pain, no cough, no fever, no headaches, no rash and no vomiting        Past Medical History:  Diagnosis Date  . Arthritis    "knees" (10/08/2013)  . Breast cancer ([place])    "left; I had 62 sessions of radiation"  . CAD (coronary artery disease), native coronary artery 10/06/2013   LAD 90%, CFX 90%, RCA 100% w/ collat  . Chronic combined systolic and diastolic CHF, NYHA class 2 ([place]) 09/2013  . CVA (cerebral vascular accident) ([place]) 1968   leaving no deficit  . DM2 (diabetes mellitus, type 2) ([place])   . GERD (gastroesophageal reflux disease)   . Hyperlipidemia   . Hypertension   . Hypothyroidism   . Ischemic dilated cardiomyopathy ([place]) 09/2013   EF 20-25% by echo  . NSVT (nonsustained ventricular tachycardia) ([address]) 09/2013  . OAB (overactive bladder)   . Osteopenia   . 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 Acute CHF (congestive heart failure) ([place]) 06/17/2020  . CHF (congestive heart failure) ([place]) 06/17/2020  . Multiple pulmonary nodules 02/26/2015  . Pleural effusion, bilateral  R >> L  02/25/2015  . Chronic diastolic heart failure ([place]) 01/03/2015  . 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 Elevated troponin   . Postoperative atrial fibrillation ([place])   . Pulmonary hypertension ([place])   . Hypokalemia   . Other specified hypothyroidism   . Malnutrition  of moderate degree ([place]) 03/24/2014  . Hypothyroidism 10/22/2013  . Overactive bladder 10/22/2013  . GERD (gastroesophageal reflux disease) 10/22/2013  . S/P CABG x 4 10/09/2013  . Coronary artery disease involving native coronary artery of native heart without angina pectoris 10/06/2013  . Angina pectoris ([place]) 10/05/2013  . Essential hypertension 10/05/2013  . Hypercholesterolemia 10/05/2013  . Diabetes mellitus type 2, controlled ([place]) 10/05/2013  . NSVT (nonsustained ventricular tachycardia) ([place]) 09/09/2013    Past Surgical History:  Procedure Laterality Date  . BREAST LUMPECTOMY Left 1998  . BREAST LUMPECTOMY WITH AXILLARY LYMPH NODE DISSECTION Left 1998  . CARDIAC CATHETERIZATION  10/06/2013  . CATARACT EXTRACTION W/ INTRAOCULAR LENS  IMPLANT, BILATERAL Bilateral ~ 2009  . COLONOSCOPY  2005  . CORONARY ARTERY BYPASS GRAFT [name] 10/09/2013   Procedure: CORONARY ARTERY BYPASS GRAFTING (CABG) x 4 using left internal mammary artery and right saphenous leg vein using endoscope.;  Surgeon: Melrose Nakayama, MD;  Location: [facility];  Service: Open Heart Surgery;  Laterality: [name];  . INTRAOPERATIVE TRANSESOPHAGEAL ECHOCARDIOGRAM [name] 10/09/2013   Procedure: INTRAOPERATIVE TRANSESOPHAGEAL ECHOCARDIOGRAM;  Surgeon: Melrose Nakayama, MD;  Location: [facility];  Service: Open Heart Surgery;  Laterality: [name];  . LEFT AND RIGHT HEART CATHETERIZATION WITH CORONARY ANGIOGRAM [name] 10/06/2013   Procedure: LEFT AND RIGHT HEART CATHETERIZATION WITH CORONARY ANGIOGRAM;  Surgeon: Sanda Klein, MD;  Location: [facility];  Service: Cardiovascular;  Laterality: [name];  . TONSILLECTOMY AND ADENOIDECTOMY  1930's  OB History   No obstetric history on file.     Family History  Problem Relation Age of Onset  . Colon cancer Mother   . Cancer Father        ? type  . Heart attack Father   . Asthma Son   . Asthma Daughter     Social History   Tobacco Use  . Smoking status: Former Smoker     Packs/day: 0.25    Years: 3.00    Pack years: 0.75    Types: Cigarettes    Quit date: 03/12/1948    Years since quitting: 72.3  . Smokeless tobacco: Never Used  . Tobacco comment: smoked in college   Substance Use Topics  . Alcohol use: No    Alcohol/week: 0.0 standard drinks  . Drug use: No    Home Medications Prior to Admission medications   Medication Sig Start Date End Date Taking? Authorizing Provider  acetaminophen (TYLENOL) 500 MG tablet Take 1,000 mg by mouth See admin instructions. Take 1,000 mg by mouth in the morning & at bedtime and an additional 500 mg once a day as needed for pain/discomfort   Yes [provider]  amLODipine (NORVASC) 10 MG tablet TAKE 1 TABLET BY MOUTH EVERY DAY Patient taking differently: Take 10 mg by mouth daily. 02/26/20  Yes Croitoru, Mihai, MD  aspirin EC 81 MG tablet Take 1 tablet (81 mg total) by mouth daily. 01/23/19  Yes Croitoru, Mihai, MD  atorvastatin (LIPITOR) 80 MG tablet Take 80 mg by mouth daily. 10/04/13  Yes [provider]  B Complex-Biotin-FA (SUPER B-50 COMPLEX PO) Take 1 tablet by mouth daily before lunch.   Yes [provider]  Biotin 5000 MCG SUBL Place 5,000 mcg under the tongue daily.   Yes [provider]  carvedilol (COREG) 25 MG tablet TAKE 1 TABLET BY MOUTH TWICE A DAY WITH MEALS Patient taking differently: Take 25 mg by mouth 2 (two) times daily with a meal. 05/30/20  Yes Croitoru, Mihai, MD  Cholecalciferol (VITAMIN D3) 125 MCG (5000 UT) CAPS Take 5,000 Units by mouth daily before lunch.   Yes [provider]  Cyanocobalamin (VITAMIN B-12) 2500 MCG SUBL Place 2,500 mcg under the tongue daily before lunch.   Yes [provider]  denosumab (PROLIA) 60 MG/ML SOSY injection Inject 60 mg into the skin every 6 (six) months.   Yes [provider]  levothyroxine (SYNTHROID, LEVOTHROID) 150 MCG tablet Take 150 mcg by mouth daily before breakfast. 12/06/14  Yes [provider]  lisinopril (ZESTRIL) 10 MG tablet TAKE 1 TABLET BY MOUTH EVERY DAY Patient taking differently: Take 10 mg by mouth daily. 10/02/19  Yes Croitoru, Mihai, MD  Magnesium Oxide 400 (240 MG) MG TABS TAKE 1 TABLET TWICE DAILY. Patient taking differently: Take 400 mg by mouth 2 (two) times daily. 12/22/13  Yes Croitoru, Mihai, MD  metFORMIN (GLUCOPHAGE) 500 MG tablet Take 1,000 mg by mouth 2 (two) times daily.   Yes [provider]  protein supplement shake (PREMIER PROTEIN) LIQD Take 237 mLs by mouth in the morning.   Yes [provider]  solifenacin (VESICARE) 10 MG tablet Take 10 mg by mouth at bedtime.   Yes [provider]  VOLTAREN 1 % GEL Apply 2 g topically daily as needed (for bilateral knee pain). 09/02/13  Yes [provider]  docusate sodium (COLACE) 100 MG capsule Take 100 mg by mouth See admin instructions. Take 100 mg by mouth at  bedtime and HOLD FOR DIARRHEA    [provider]  traMADol (ULTRAM) 50 MG tablet Take 50 mg by mouth 3 (three) times daily as needed for moderate pain. 05/24/20   [provider]    Allergies    Amiodarone, Crestor [rosuvastatin], and Sitagliptin  Review of Systems   Review of Systems  Unable to perform ROS: Acuity of condition  Constitutional: Negative for chills and fever.  HENT: Negative for congestion and rhinorrhea.   Respiratory: Positive for shortness of breath. Negative for cough.   Cardiovascular: Positive for leg swelling. Negative for chest pain and palpitations.  Gastrointestinal: Negative for diarrhea, nausea and vomiting.  Genitourinary: Negative for difficulty urinating and dysuria.  Musculoskeletal: Negative for arthralgias and back pain.  Skin: Negative for rash and wound.  Neurological: Negative for light-headedness and headaches.  family provided much of the history   Physical Exam Updated Vital Signs BP 125/71 (BP Location: Right Arm)   Pulse 61   Temp 98.3 F  (36.8 C) (Oral)   Resp 16   Ht 5\' 6"  (1.676 m)   Wt 69.2 kg   SpO2 (!) 89%   BMI 24.62 kg/m   Physical Exam Vitals and nursing note reviewed. Exam conducted with a chaperone present.  Constitutional:      General: She is not in acute distress.    Appearance: Normal appearance.  HENT:     Head: Normocephalic and atraumatic.     Nose: No rhinorrhea.  Eyes:     General:        Right eye: No discharge.        Left eye: No discharge.     Conjunctiva/sclera: Conjunctivae normal.  Cardiovascular:     Rate and Rhythm: Normal rate and regular rhythm.  Pulmonary:     Effort: Pulmonary effort is normal. No respiratory distress.     Breath sounds: No stridor. Rales present.  Abdominal:     General: Abdomen is flat. There is no distension.     Palpations: Abdomen is soft.  Musculoskeletal:        General: No tenderness or signs of injury.     Right lower leg: Edema present.     Left lower leg: Edema present.  Skin:    General: Skin is warm and dry.  Neurological:     General: No focal deficit present.     Mental Status: She is alert. Mental status is at baseline.     Motor: No weakness.  Psychiatric:        Mood and Affect: Mood normal.        Behavior: Behavior normal.     ED Results / Procedures / Treatments   Labs (all labs ordered are listed, but only abnormal results are displayed) Labs Reviewed  CBC WITH DIFFERENTIAL/PLATELET - Abnormal; Notable for the following components:      Result Value   RBC 5.12 (*)    Hemoglobin 17.4 (*)    HCT 53.4 (*)    MCV 104.3 (*)    All other components within normal limits  BASIC METABOLIC PANEL - Abnormal; Notable for the following components:   Sodium 133 (*)    Glucose, Bld 267 (*)    GFR, Estimated 58 (*)    All other components within normal limits  BRAIN NATRIURETIC PEPTIDE - Abnormal; Notable for the following components:   B Natriuretic Peptide 892.4 (*)    All other components within normal limits  HEMOGLOBIN A1C -  Abnormal; Notable for  the following components:   Hgb A1c MFr Bld 8.2 (*)    All other components within normal limits  BASIC METABOLIC PANEL - Abnormal; Notable for the following components:   Glucose, Bld 124 (*)    All other components within normal limits  GLUCOSE, CAPILLARY - Abnormal; Notable for the following components:   Glucose-Capillary 207 (*)    All other components within normal limits  GLUCOSE, CAPILLARY - Abnormal; Notable for the following components:   Glucose-Capillary 212 (*)    All other components within normal limits  CBG MONITORING, ED - Abnormal; Notable for the following components:   Glucose-Capillary 171 (*)    All other components within normal limits  TROPONIN I (HIGH SENSITIVITY) - Abnormal; Notable for the following components:   Troponin I (High Sensitivity) 33 (*)    All other components within normal limits  TROPONIN I (HIGH SENSITIVITY) - Abnormal; Notable for the following components:   Troponin I (High Sensitivity) 28 (*)    All other components within normal limits  RESP PANEL BY RT-PCR (FLU A&B, COVID) ARPGX2  TSH    EKG None  Radiology DG Chest Portable 1 View  Result Date: 06/17/2020 CLINICAL DATA:  Short of breath, hypoxia, history of CHF EXAM: PORTABLE CHEST 1 VIEW COMPARISON:  Prior chest x-ray 03/29/2015 FINDINGS: Enlarged cardiopericardial silhouette consistent with cardiomegaly. Atherosclerotic calcifications visualized in the transverse aorta. Patient is status post median sternotomy for multivessel CABG. Bibasilar airspace opacities are present with blunting of the costophrenic angles consistent with a combination of layering pleural fluid with atelectasis and/or infiltrate. Mild vascular congestion without overt edema. Chronic bronchitic changes and interstitial prominence are similar compared to relatively remote prior imaging. No pneumothorax. Surgical clips again seen in the left axilla. No acute osseous abnormality. IMPRESSION: 1.  Cardiomegaly and pulmonary vascular congestion bordering on mild edema. 2. Bilateral moderate layering pleural effusions with associated bibasilar atelectasis and/or infiltrate. 3. Aortic atherosclerosis. Electronically Signed   By: Jacqulynn Cadet M.D.   On: 06/17/2020 12:41    Procedures .Critical Care E&M Performed by: Breck Coons, MD  Critical care provider statement:    Critical care time (minutes):  60   Critical care was necessary to treat or prevent imminent or life-threatening deterioration of the following conditions:  Respiratory failure   Critical care was time spent personally by me on the following activities:  Blood draw for specimens, development of treatment plan with patient or surrogate, discussions with consultants, evaluation of patient's response to treatment, examination of patient, obtaining history from patient or surrogate, ordering and performing treatments and interventions, ordering and review of laboratory studies, re-evaluation of patient's condition and review of old charts   Care discussed with: admitting provider   After initial E/M assessment, critical care services were subsequently performed that were exclusive of separately billable procedures or treatment.       Medications Ordered in ED Medications  aspirin EC tablet 81 mg (has no administration in time range)  atorvastatin (LIPITOR) tablet 80 mg (has no administration in time range)  carvedilol (COREG) tablet 25 mg (25 mg Oral Given 06/17/20 1831)  lisinopril (ZESTRIL) tablet 10 mg (has no administration in time range)  furosemide (LASIX) injection 40 mg (has no administration in time range)  levothyroxine (SYNTHROID) tablet 150 mcg (150 mcg Oral Given 06/18/20 0512)  metFORMIN (GLUCOPHAGE) tablet 500 mg (500 mg Oral Given 06/17/20 1814)  docusate sodium (COLACE) capsule 100 mg (has no administration in time range)  cholecalciferol (VITAMIN D3)  tablet 5,000 Units (has no administration in time range)   diclofenac Sodium (VOLTAREN) 1 % topical gel 1 application (has no administration in time range)  sodium chloride flush (NS) 0.9 % injection 3 mL (3 mLs Intravenous Not Given 06/18/20 0030)  sodium chloride flush (NS) 0.9 % injection 3 mL (has no administration in time range)  0.9 %  sodium chloride infusion (has no administration in time range)  acetaminophen (TYLENOL) tablet 650 mg (has no administration in time range)  ondansetron (ZOFRAN) injection 4 mg (has no administration in time range)  enoxaparin (LOVENOX) injection 40 mg (40 mg Subcutaneous Given 06/17/20 1813)  hydrALAZINE (APRESOLINE) tablet 25 mg (has no administration in time range)  insulin aspart (novoLOG) injection 0-15 Units (5 Units Subcutaneous Given 06/17/20 1813)  magnesium oxide (MAG-OX) tablet 400 mg (400 mg Oral Given 06/17/20 2248)  darifenacin (ENABLEX) 24 hr tablet 15 mg (has no administration in time range)  furosemide (LASIX) injection 40 mg (40 mg Intravenous Given 06/17/20 1157)    ED Course  I have reviewed the triage vital signs and the nursing notes.  Pertinent labs & imaging results that were available during my care of the patient were reviewed by me and considered in my medical decision making (see chart for details).    MDM Rules/Calculators/A&P                          Significant shortness of breath.  Sent from [place] office for O2 sat in the 70s with increased work of breathing.  Placed on oxygen with minimal improvement.  Placed on CPAP and transported to [place].  Upon arrival CPAP was removed and patient's oxygen immediately dropped into the mid 70s.  She has diffuse rales throughout bilateral lower extremity edema consistent with history of heart failure.  She will need Lasix BiPAP laboratory screen and further assessment for volume overload in the setting of heart failure.  Biomarkers consistent with heart failure exacerbation after my review.  Chest x-ray with pulmonary edema pulmonary vascular  congestion and cardiomegaly.  Otherwise unremarkable work-up.  Unknown source for exacerbation of heart failure.  Will wean off BiPAP we will continue Lasix diuresis.  Patient is put out a significant amount of volume.  Will admit to the hospital for further care.  The patient will be admitted to the hospitalist.  For the remainder this patient's care please see inpatient team notes.  I will intervene as needed while the patient remains in the emergency department.   CRITICAL CARE Performed by: Breck Coons   Total critical care time: 60 minutes  Critical care time was exclusive of separately billable procedures and treating other patients.  Critical care was necessary to treat or prevent imminent or life-threatening deterioration.  Critical care was time spent personally by me on the following activities: development of treatment plan with patient and/or surrogate as well as nursing, discussions with consultants, evaluation of patient's response to treatment, examination of patient, obtaining history from patient or surrogate, ordering and performing treatments and interventions, ordering and review of laboratory studies, ordering and review of radiographic studies, pulse oximetry and re-evaluation of patient's condition.  Final Clinical Impression(s) / ED Diagnoses Final diagnoses:  Hypervolemia, unspecified hypervolemia type  Acute hypoxemic respiratory failure (HCC)  Acute on chronic congestive heart failure, unspecified heart failure type [facility])    Rx / DC Orders ED Discharge Orders    None       Breck Coons,  MD 06/18/20 0762

## 2020-06-17 NOTE — Telephone Encounter (Signed)
Spoke with the pharmacist at [facility] ED. She stated that the patient was currently there and that the daughter had a question about the patient taking both the Amlodipine and Lisinopril. She has been advised that according to epic that the patient has been on both since at least 2020.

## 2020-06-18 ENCOUNTER — Inpatient Hospital Stay (HOSPITAL_COMMUNITY): Payer: Medicare HMO

## 2020-06-18 DIAGNOSIS — E785 Hyperlipidemia, unspecified: Secondary | ICD-10-CM | POA: Diagnosis not present

## 2020-06-18 DIAGNOSIS — I10 Essential (primary) hypertension: Secondary | ICD-10-CM | POA: Diagnosis not present

## 2020-06-18 DIAGNOSIS — I5033 Acute on chronic diastolic (congestive) heart failure: Secondary | ICD-10-CM | POA: Diagnosis not present

## 2020-06-18 DIAGNOSIS — J9601 Acute respiratory failure with hypoxia: Secondary | ICD-10-CM | POA: Diagnosis present

## 2020-06-18 DIAGNOSIS — J9621 Acute and chronic respiratory failure with hypoxia: Secondary | ICD-10-CM | POA: Diagnosis present

## 2020-06-18 DIAGNOSIS — I509 Heart failure, unspecified: Secondary | ICD-10-CM | POA: Diagnosis not present

## 2020-06-18 DIAGNOSIS — I2510 Atherosclerotic heart disease of native coronary artery without angina pectoris: Secondary | ICD-10-CM | POA: Diagnosis not present

## 2020-06-18 DIAGNOSIS — I5031 Acute diastolic (congestive) heart failure: Secondary | ICD-10-CM

## 2020-06-18 DIAGNOSIS — L8990 Pressure ulcer of unspecified site, unspecified stage: Secondary | ICD-10-CM | POA: Insufficient documentation

## 2020-06-18 LAB — ECHOCARDIOGRAM COMPLETE
AR max vel: 1.6 cm2
AV Area VTI: 1.4 cm2
AV Area mean vel: 1.3 cm2
AV Mean grad: 7 mmHg
AV Peak grad: 10.4 mmHg
Ao pk vel: 1.61 m/s
Area-P 1/2: 2.99 cm2
Height: 66 in
S' Lateral: 2.6 cm
Single Plane A4C EF: 53.7 %
Weight: 2440.93 oz

## 2020-06-18 LAB — BASIC METABOLIC PANEL
Anion gap: 11 (ref 5–15)
BUN: 17 mg/dL (ref 8–23)
CO2: 26 mmol/L (ref 22–32)
Calcium: 9.6 mg/dL (ref 8.9–10.3)
Chloride: 99 mmol/L (ref 98–111)
Creatinine, Ser: 0.78 mg/dL (ref 0.44–1.00)
GFR, Estimated: >60 mL/min (ref >60)
Glucose, Bld: 124 mg/dL — ABNORMAL HIGH (ref 70–99)
Potassium: 4.1 mmol/L (ref 3.5–5.1)
Sodium: 136 mmol/L (ref 135–145)

## 2020-06-18 LAB — GLUCOSE, CAPILLARY
Glucose-Capillary: 152 mg/dL — ABNORMAL HIGH (ref 70–99)
Glucose-Capillary: 145 mg/dL — ABNORMAL HIGH (ref 70–99)
Glucose-Capillary: 158 mg/dL — ABNORMAL HIGH (ref 70–99)
Glucose-Capillary: 166 mg/dL — ABNORMAL HIGH (ref 70–99)

## 2020-06-18 MED ORDER — BLISTEX MEDICATED EX OINT
TOPICAL_OINTMENT | CUTANEOUS | Status: DC | PRN
  Filled 2020-06-18: qty 6.3

## 2020-06-18 MED ORDER — FUROSEMIDE 10 MG/ML IJ SOLN
40.0000 mg | Freq: Two times a day (BID) | INTRAMUSCULAR | Status: DC
  Administered 2020-06-18 – 2020-06-19 (×2): 40 mg via INTRAVENOUS
  Filled 2020-06-18 (×2): qty 4

## 2020-06-18 NOTE — Consult Note (Signed)
Cardiology Consultation:   Patient ID: Marissa Jefferson MRN: [id number]; DOB: 1924/05/29  Admit date: 06/17/2020 Date of Consult: 06/18/2020  PCP:  Merrilee Seashore, MD   [place]  Cardiologist:  No primary care provider on file.  Advanced Practice Provider:  No care team member to display Electrophysiologist:  None    Patient Profile:   Marissa Jefferson is a 87 y.o. female with a hx of chronic diastolic heart failure, HTN, HLD, CAD s/p 3v CABG, and DMII who is being seen today for the evaluation of acute on chronic diastolic heart failure exacerbation at the request of Dr. Tana Coast.  History of Present Illness:   Marissa Jefferson is a 87 year old female with the history detailed above who is followed by Dr. Sallyanne Kuster as an out-patient. She initially presented in 2015 with severe LV systolic dysfunction with LVEF 20-25% and episodes of NSVT. She was found to have multivessel CAD and underwent 3v CABG. Post-op, her EF recovered to 55%. She was last seen in clinic by Dr. Sallyanne Kuster on 02/2020 where she found herself slowing down but was still living independently. She had no HF symptoms at that time.  She was doing okay until about 2 weeks ago when she developed worsening shortness of breath and orthopnea. Her symptoms acutely worsened and EMS was called where she was found to be hypoxic to the 70s requiring BiPAP. CXR with pulmonary edema. BNP 892. Trop 33-->28. ECG with sinus, right bundle, first degree AVB. She was given lasix 40mg  IV with improvement. Cardiology is now consulted for management of acute on chronic diastolic heart failure.   Currently, the patient feels better. Breathing improved. Weaned to [place]. Responding well to lasix.   Past Medical History:  Diagnosis Date  . Arthritis    "knees" (10/08/2013)  . Breast cancer ([place])    "left; I had 62 sessions of radiation"  . CAD (coronary artery disease), native coronary artery 10/06/2013   LAD 90%, CFX 90%, RCA 100% w/ collat   . Chronic combined systolic and diastolic CHF, NYHA class 2 ([place]) 09/2013  . CVA (cerebral vascular accident) ([place]) 1968   leaving no deficit  . DM2 (diabetes mellitus, type 2) ([place])   . GERD (gastroesophageal reflux disease)   . Hyperlipidemia   . Hypertension   . Hypothyroidism   . Ischemic dilated cardiomyopathy ([place]) 09/2013   EF 20-25% by echo  . NSVT (nonsustained ventricular tachycardia) ([place]) 09/2013  . OAB (overactive bladder)   . Osteopenia   . 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 BREAST LUMPECTOMY Left 1998  . BREAST LUMPECTOMY WITH AXILLARY LYMPH NODE DISSECTION Left 1998  . CARDIAC CATHETERIZATION  10/06/2013  . CATARACT EXTRACTION W/ INTRAOCULAR LENS  IMPLANT, BILATERAL Bilateral ~ 2009  . COLONOSCOPY  2005  . CORONARY ARTERY BYPASS GRAFT [name] 10/09/2013   Procedure: CORONARY ARTERY BYPASS GRAFTING (CABG) x 4 using left internal mammary artery and right saphenous leg vein using endoscope.;  Surgeon: Melrose Nakayama, MD;  Location: [facility];  Service: Open Heart Surgery;  Laterality: [name];  . INTRAOPERATIVE TRANSESOPHAGEAL ECHOCARDIOGRAM [name] 10/09/2013   Procedure: INTRAOPERATIVE TRANSESOPHAGEAL ECHOCARDIOGRAM;  Surgeon: Melrose Nakayama, MD;  Location: [facility];  Service: Open Heart Surgery;  Laterality: [name];  . LEFT AND RIGHT HEART CATHETERIZATION WITH CORONARY ANGIOGRAM [name] 10/06/2013   Procedure: LEFT AND RIGHT HEART CATHETERIZATION WITH CORONARY ANGIOGRAM;  Surgeon: Sanda Klein, MD;  Location: [facility] CATH LAB;  Service: Cardiovascular;  Laterality: [name];  . TONSILLECTOMY AND ADENOIDECTOMY  1930's     Home Medications:  Prior to Admission medications   Medication Sig Start Date End Date Taking? Authorizing Provider  acetaminophen (TYLENOL) 500 MG tablet Take 1,000 mg by mouth See admin instructions. Take 1,000 mg by mouth in the morning & at bedtime and an additional  500 mg once a day as needed for pain/discomfort   Yes [provider]  amLODipine (NORVASC) 10 MG tablet TAKE 1 TABLET BY MOUTH EVERY DAY Patient taking differently: Take 10 mg by mouth daily. 02/26/20  Yes Croitoru, Mihai, MD  aspirin EC 81 MG tablet Take 1 tablet (81 mg total) by mouth daily. 01/23/19  Yes Croitoru, Mihai, MD  atorvastatin (LIPITOR) 80 MG tablet Take 80 mg by mouth daily. 10/04/13  Yes [provider]  B Complex-Biotin-FA (SUPER B-50 COMPLEX PO) Take 1 tablet by mouth daily before lunch.   Yes [provider]  Biotin 5000 MCG SUBL Place 5,000 mcg under the tongue daily.   Yes [provider]  carvedilol (COREG) 25 MG tablet TAKE 1 TABLET BY MOUTH TWICE A DAY WITH MEALS Patient taking differently: Take 25 mg by mouth 2 (two) times daily with a meal. 05/30/20  Yes Croitoru, Mihai, MD  Cholecalciferol (VITAMIN D3) 125 MCG (5000 UT) CAPS Take 5,000 Units by mouth daily before lunch.   Yes [provider]  Cyanocobalamin (VITAMIN B-12) 2500 MCG SUBL Place 2,500 mcg under the tongue daily before lunch.   Yes [provider]  denosumab (PROLIA) 60 MG/ML SOSY injection Inject 60 mg into the skin every 6 (six) months.   Yes [provider]  levothyroxine (SYNTHROID, LEVOTHROID) 150 MCG tablet Take 150 mcg by mouth daily before breakfast. 12/06/14  Yes [provider]  lisinopril (ZESTRIL) 10 MG tablet TAKE 1 TABLET BY MOUTH EVERY DAY Patient taking differently: Take 10 mg by mouth daily. 10/02/19  Yes Croitoru, Mihai, MD  Magnesium Oxide 400 (240 MG) MG TABS TAKE 1 TABLET TWICE DAILY. Patient taking differently: Take 400 mg by mouth 2 (two) times daily. 12/22/13  Yes Croitoru, Mihai, MD  metFORMIN (GLUCOPHAGE) 500 MG tablet Take 1,000 mg by mouth 2 (two) times daily.   Yes [provider]  protein supplement shake (PREMIER PROTEIN) LIQD Take 237 mLs by mouth in the morning.   Yes [provider]   solifenacin (VESICARE) 10 MG tablet Take 10 mg by mouth at bedtime.   Yes [provider]  VOLTAREN 1 % GEL Apply 2 g topically daily as needed (for bilateral knee pain). 09/02/13  Yes [provider]  docusate sodium (COLACE) 100 MG capsule Take 100 mg by mouth See admin instructions. Take 100 mg by mouth at bedtime and HOLD FOR DIARRHEA    [provider]  traMADol (ULTRAM) 50 MG tablet Take 50 mg by mouth 3 (three) times daily as needed for moderate pain. 05/24/20   [provider]    Inpatient Medications: Scheduled Meds: . aspirin EC  81 mg Oral Daily  . atorvastatin  80 mg Oral QPM  . carvedilol  25 mg Oral BID WC  . cholecalciferol  5,000 Units Oral Daily  . darifenacin  15 mg Oral Daily  . enoxaparin (LOVENOX) injection  40 mg Subcutaneous Q24H  . furosemide  40 mg Intravenous Q12H  . insulin aspart  0-15 Units Subcutaneous TID WC  . levothyroxine  150 mcg Oral Daily  .  magnesium oxide  400 mg Oral BID  . sodium chloride flush  3 mL Intravenous Q12H   Continuous Infusions: . sodium chloride     PRN Meds: sodium chloride, acetaminophen, diclofenac Sodium, docusate sodium, hydrALAZINE, lip balm, ondansetron (ZOFRAN) IV, sodium chloride flush  Allergies:    Allergies  Allergen Reactions  . Amiodarone Other (See Comments)    Bradycardia  . Crestor [Rosuvastatin] Nausea Only  . Sitagliptin Other (See Comments)    Caused urinary frequency    Social History:   Social History   Socioeconomic History  . Marital status: Widowed    Spouse name: Not on file  . Number of children: Not on file  . Years of education: Not on file  . Highest education level: Not on file  Occupational History  . Not on file  Tobacco Use  . Smoking status: Former Smoker    Packs/day: 0.25    Years: 3.00    Pack years: 0.75    Types: Cigarettes    Quit date: 03/12/1948    Years since quitting: 72.3  . Smokeless tobacco: Never Used  . Tobacco comment:  smoked in college   Substance and Sexual Activity  . Alcohol use: No    Alcohol/week: 0.0 standard drinks  . Drug use: No  . Sexual activity: Not Currently  Other Topics Concern  . Not on file  Social History Narrative  . Not on file   Social Determinants of Health   Financial Resource Strain: Not on file  Food Insecurity: Not on file  Transportation Needs: Not on file  Physical Activity: Not on file  Stress: Not on file  Social Connections: Not on file  Intimate Partner Violence: Not on file    Family History:    Family History  Problem Relation Age of Onset  . Colon cancer Mother   . Cancer Father        ? type  . Heart attack Father   . Asthma Son   . Asthma Daughter      ROS:  Please see the history of present illness.  Review of Systems  Constitutional: Positive for malaise/fatigue. Negative for chills and fever.  HENT: Negative for hearing loss.   Eyes: Negative for blurred vision and redness.  Respiratory: Positive for shortness of breath.   Cardiovascular: Positive for orthopnea. Negative for chest pain, palpitations, claudication, leg swelling and PND.  Gastrointestinal: Negative for melena, nausea and vomiting.  Genitourinary: Negative for dysuria.  Musculoskeletal: Negative for falls.  Neurological: Negative for dizziness and loss of consciousness.  Endo/Heme/Allergies: Negative for polydipsia.  Psychiatric/Behavioral: Negative for substance abuse.    Physical Exam/Data:   Vitals:   06/17/20 1953 06/18/20 0019 06/18/20 0435 06/18/20 0734  BP: 100/61 111/73 125/71 122/75  Pulse: 64 63 61 61  Resp: 18 18 16 18   Temp: 98.6 F (37 C) 98.6 F (37 C) 98.3 F (36.8 C) 98.3 F (36.8 C)  TempSrc: Oral Oral Oral Oral  SpO2: 93% 91% (!) 89% 93%  Weight:   69.2 kg   Height:        Intake/Output Summary (Last 24 hours) at 06/18/2020 1102 Last data filed at 06/18/2020 0500 Gross per 24 hour  Intake 0 ml  Output 1390 ml  Net -1390 ml   Last 3 Weights  06/18/2020 06/17/2020 02/11/2020  Weight (lbs) 152 lb 8.9 oz 143 lb 3.2 oz 143 lb  Weight (kg) 69.2 kg 64.955 kg 64.864 kg     Body mass index  is 24.62 kg/m.  General:  Elderly female, comfortable, NAD HEENT: normal Lymph: no adenopathy Neck: JVD to the ear with prominent V-waves Vascular: No carotid bruits; FA pulses 2+ bilaterally without bruits  Cardiac:  RR, 2/6 systolic murmur, no rubs or gallops Lungs:  Crackles at the based Abd: soft, nontender, no hepatomegaly  Ext: no edema Musculoskeletal:  No deformities, BUE and BLE strength normal and equal Skin: warm and dry  Neuro:  CNs 2-12 intact, no focal abnormalities noted Psych:  Normal affect   EKG:  The EKG was personally reviewed and demonstrates:  NSR, RBBB, 1 degree AVB Telemetry:  Telemetry was personally reviewed and demonstrates:  NSR  Relevant CV Studies: TTE 2016:  Study Conclusions   - Left ventricle: Inferobasal hypokinesis The cavity size was  normal. Wall thickness was increased in a pattern of moderate  LVH. The estimated ejection fraction was 55%.  - Aortic valve: small systolic gradient with no significant  stenosis  - Mitral valve: There was mild regurgitation.  - Right ventricle: The cavity size was mildly dilated.  - Atrial septum: No defect or patent foramen ovale was identified.  - Pulmonary arteries: PA peak pressure: 46 mm Hg (S).    Laboratory Data:  High Sensitivity Troponin:   Recent Labs  Lab 06/17/20 1126 06/17/20 1426  TROPONINIHS 33* 28*     Chemistry Recent Labs  Lab 06/17/20 1126 06/18/20 0250  NA 133* 136  K 4.7 4.1  CL 98 99  CO2 26 26  GLUCOSE 267* 124*  BUN 21 17  CREATININE 0.92 0.78  CALCIUM 10.1 9.6  GFRNONAA 58* >60  ANIONGAP 9 11    No results for input(s): PROT, ALBUMIN, AST, ALT, ALKPHOS, BILITOT in the last 168 hours. Hematology Recent Labs  Lab 06/17/20 1126  WBC 9.3  RBC 5.12*  HGB 17.4*  HCT 53.4*  MCV 104.3*  MCH 34.0  MCHC 32.6  RDW 13.8   PLT 209   BNP Recent Labs  Lab 06/17/20 1127  BNP 892.4*    DDimer No results for input(s): DDIMER in the last 168 hours.   Radiology/Studies:  DG Chest Port 1 View  Result Date: 06/18/2020 CLINICAL DATA:  Shortness of breath EXAM: PORTABLE CHEST 1 VIEW COMPARISON:  June 17, 2020. FINDINGS: There is a persistent small left pleural effusion with consolidation in the left lower lung region. Small right pleural effusion noted. Focal airspace opacity lateral right base again noted without change. No new opacity evident. There is cardiomegaly with pulmonary vascularity normal. There is aortic atherosclerosis. Patient is status post coronary artery bypass grafting. No appreciable adenopathy by portable radiography. Surgical clips noted in left axilla. IMPRESSION: Stable cardiomegaly. Small pleural effusions bilaterally. Consolidation consistent with atelectasis and potential superimposed pneumonia left lower lobe. Suspect small focus of infiltrate lateral right base. Appearance of the lungs is stable compared to 1 day prior. Status post coronary artery bypass grafting. Aortic Atherosclerosis (ICD10-I70.0). Electronically Signed   By: Lowella Grip III M.D.   On: 06/18/2020 09:35   DG Chest Portable 1 View  Result Date: 06/17/2020 CLINICAL DATA:  Short of breath, hypoxia, history of CHF EXAM: PORTABLE CHEST 1 VIEW COMPARISON:  Prior chest x-ray 03/29/2015 FINDINGS: Enlarged cardiopericardial silhouette consistent with cardiomegaly. Atherosclerotic calcifications visualized in the transverse aorta. Patient is status post median sternotomy for multivessel CABG. Bibasilar airspace opacities are present with blunting of the costophrenic angles consistent with a combination of layering pleural fluid with atelectasis and/or infiltrate. Mild vascular congestion  without overt edema. Chronic bronchitic changes and interstitial prominence are similar compared to relatively remote prior imaging. No pneumothorax.  Surgical clips again seen in the left axilla. No acute osseous abnormality. IMPRESSION: 1. Cardiomegaly and pulmonary vascular congestion bordering on mild edema. 2. Bilateral moderate layering pleural effusions with associated bibasilar atelectasis and/or infiltrate. 3. Aortic atherosclerosis. Electronically Signed   By: Jacqulynn Cadet M.D.   On: 06/17/2020 12:41     Assessment and Plan:   #Acute on chronic HF exacerbation: Patient presented with worsening SOB x 2 weeks found to be volume overloaded with pulmonary edema on CXR and BNP 900. Trop flat and ECG without ischemic changes. Initially required BiPAP now improved and weaned to [facility]. -Continue lasix 40mg  IV BID -Repeat TTE ordered -Continue coreg 25mg  BID -Will add back ACE as tolerated; likely tomorrow -Monitor I/Os and daily weights -Low Na diet  #Multivessel CAD s/p CABG in 2015: EF 55% on last TTE in 2016 with repeat pending. Low suspicion for underlying ischemic etiology of acute HFpEF exacerbation. -Continue ASA 81mg  daily -Continue atorvastatin 80mg  daily -Continue coreg -Add back ACE tomorrow   #HTN: -Continue coreg as above -Add back ACE tomorrow  #HLD: -Continue lipitor as above  #DMII: -Management per primary   Risk Assessment/Risk Scores:        New York Heart Association (NYHA) Functional Class NYHA Class III        For questions or updates, please contact [facility] Please consult [email or web address] for contact info under    Signed, Freada Bergeron, MD  06/18/2020 11:02 AM

## 2020-06-18 NOTE — Progress Notes (Addendum)
[facility]                                                                              Patient Demographics  Marissa Jefferson, is a 87 y.o. female, DOB - June 19, 1924, DHR:[id number]  Admit date - 06/17/2020   Admitting Physician Lequita Halt, MD  Outpatient Primary MD for the patient is Merrilee Seashore, MD  Outpatient specialists:   LOS - 1  days   Medical records reviewed and are as summarized below:    Chief Complaint  Patient presents with  . Shortness of Breath       Brief summary   Patient is a 87 year old female with history of chronic diastolic CHF, hypertension, hyperlipidemia, CAD s/p 3v CABG, hypothyroidism, DM presented with increasing shortness of breath for last 2 weeks. Patient ported symptoms started 2 weeks ago and gradually worsening, initially exertional.  Then developed constant shortness of breath in the last 2 days, orthopnea at night.  Patient also noted her shoes do not fit. In ED, patient was found to have O2 sats in 70s, improved to 80s on 5 L O2, failed BiPAP trial.  Improved Admitted for further work-up  Assessment & Plan    Principal Problem:   Acute respiratory failure with hypoxia ([place]) likely secondary to acute on chronic diastolic CHF -Presented with fluid overload, hypoxia, BNP elevated 892 -Patient was placed on Lasix 40 mg IV daily, follow strict I's and O's and daily weights -Negative balance of 1.3 L -O2 sats currently 93% on 6 L via  -will increase Lasix to 40 mg IV every 12 hours, reassess in a.m. -DC'd lisinopril to allow more diuresis.  Continue to hold amlodipine -Follow 2D echo, cardiology consult pending results, follows Dr. Sallyanne Kuster Addendum 2DEcho: EF 55-60%, G2DD, right heart failure, large left pleural effusion   Active Problems:   Essential hypertension -BP currently stable, hold lisinopril and Norvasc -Continue Coreg, Lasix -Hydralazine as needed for elevated BP    Hypercholesterolemia -Continue  Lipitor    Diabetes mellitus type 2, uncontrolled ([place]), not on long-term insulin with hypoglycemia -Hold Metformin, continue sliding scale insulin -Hemoglobin A1c 8.2 -Will benefit from SGLT2 at discharge    Coronary artery disease S/P CABG -Follow 2D echo, currently no chest pain -Troponins mildly elevated due to demand ischemia from #1    Hypothyroidism -Continue Synthroid, TSH 2.1    Pressure injury of skin Stage I, medial sacrum, present on admission Wound care per nursing  Generalized debility -PT OT evaluation   Code Status: Full CODE STATUS DVT Prophylaxis:  enoxaparin (LOVENOX) injection 40 mg Start: 06/17/20 1604   Level of Care: Level of care: Telemetry Medical Family Communication: Discussed all imaging results, lab results, explained to the patient's daughter, [name] today  Disposition Plan:     Status is: Inpatient  Remains inpatient appropriate because:Inpatient level of care appropriate due to severity of illness   Dispo: The patient is from: Home              Anticipated d/c is to: Home              Patient currently is not medically  stable to d/c.   Difficult to place patient No      Time Spent in minutes   35 minutes  Procedures:  None  Consultants:    None  Antimicrobials:   Anti-infectives (From admission, onward)   None         Medications  Scheduled Meds: . aspirin EC  81 mg Oral Daily  . atorvastatin  80 mg Oral QPM  . carvedilol  25 mg Oral BID WC  . cholecalciferol  5,000 Units Oral Daily  . darifenacin  15 mg Oral Daily  . enoxaparin (LOVENOX) injection  40 mg Subcutaneous Q24H  . furosemide  40 mg Intravenous Daily  . insulin aspart  0-15 Units Subcutaneous TID WC  . levothyroxine  150 mcg Oral Daily  . lisinopril  10 mg Oral Daily  . magnesium oxide  400 mg Oral BID  . metFORMIN  500 mg Oral BID WC  . sodium chloride flush  3 mL Intravenous Q12H   Continuous Infusions: . sodium chloride     PRN Meds:.sodium  chloride, acetaminophen, diclofenac Sodium, docusate sodium, hydrALAZINE, lip balm, ondansetron (ZOFRAN) IV, sodium chloride flush      Subjective:   Marissa Jefferson was seen and examined today.  Still on 6 L O2 via nasal cannula,*feeling slightly better, no chest pain. Patient denies dizziness,  abdominal pain, N/V/D/C, new weakness, numbess, tingling. No acute events overnight.    Objective:   Vitals:   06/17/20 1953 06/18/20 0019 06/18/20 0435 06/18/20 0734  BP: 100/61 111/73 125/71 122/75  Pulse: 64 63 61 61  Resp: 18 18 16 18   Temp: 98.6 F (37 C) 98.6 F (37 C) 98.3 F (36.8 C) 98.3 F (36.8 C)  TempSrc: Oral Oral Oral Oral  SpO2: 93% 91% (!) 89% 93%  Weight:   69.2 kg   Height:        Intake/Output Summary (Last 24 hours) at 06/18/2020 1002 Last data filed at 06/18/2020 0500 Gross per 24 hour  Intake 0 ml  Output 1390 ml  Net -1390 ml     Wt Readings from Last 3 Encounters:  06/18/20 69.2 kg  02/11/20 64.9 kg  01/23/19 64.4 kg     Exam  General: Alert and oriented x 3, NAD  Cardiovascular: S1 S2 auscultated, no murmurs, RRR, + JVD  Respiratory: Bibasilar crackles  Gastrointestinal: Soft, nontender, nondistended, + bowel sounds  Ext: 1-2+ pedal edema bilaterally  Neuro: no new deficits  Musculoskeletal: No digital cyanosis, clubbing  Skin: No rashes  Psych: Normal affect and demeanor, alert and oriented x3    Data Reviewed:  I have personally reviewed following labs and imaging studies  Micro Results Recent Results (from the past 240 hour(s))  Resp Panel by RT-PCR (Flu A&B, Covid) Nasopharyngeal Swab     Status: None   Collection Time: 06/17/20  2:20 PM   Specimen: Nasopharyngeal Swab; Nasopharyngeal(NP) swabs in vial transport medium  Result Value Ref Range Status   SARS Coronavirus 2 by RT PCR NEGATIVE NEGATIVE Final    Comment: (NOTE) SARS-CoV-2 target nucleic acids are NOT DETECTED.  The SARS-CoV-2 RNA is generally detectable in upper  respiratory specimens during the acute phase of infection. The lowest concentration of SARS-CoV-2 viral copies this assay can detect is 138 copies/mL. A negative result does not preclude SARS-Cov-2 infection and should not be used as the sole basis for treatment or other patient management decisions. A negative result may occur with  improper specimen collection/handling, submission of specimen  other than nasopharyngeal swab, presence of viral mutation(s) within the areas targeted by this assay, and inadequate number of viral copies(<138 copies/mL). A negative result must be combined with clinical observations, patient history, and epidemiological information. The expected result is Negative.  Fact Sheet for Patients:  EntrepreneurPulse.com.au  Fact Sheet for Healthcare Providers:  IncredibleEmployment.be  This test is no t yet approved or cleared by the [place] FDA and  has been authorized for detection and/or diagnosis of SARS-CoV-2 by FDA under an Emergency Use Authorization (EUA). This EUA will remain  in effect (meaning this test can be used) for the duration of the COVID-19 declaration under Section 564(b)(1) of the Act, 21 U.S.C.section 360bbb-3(b)(1), unless the authorization is terminated  or revoked sooner.       Influenza A by PCR NEGATIVE NEGATIVE Final   Influenza B by PCR NEGATIVE NEGATIVE Final    Comment: (NOTE) The Xpert Xpress SARS-CoV-2/FLU/RSV plus assay is intended as an aid in the diagnosis of influenza from Nasopharyngeal swab specimens and should not be used as a sole basis for treatment. Nasal washings and aspirates are unacceptable for Xpert Xpress SARS-CoV-2/FLU/RSV testing.  Fact Sheet for Patients: EntrepreneurPulse.com.au  Fact Sheet for Healthcare Providers: IncredibleEmployment.be  This test is not yet approved or cleared by the [place] FDA and has been  authorized for detection and/or diagnosis of SARS-CoV-2 by FDA under an Emergency Use Authorization (EUA). This EUA will remain in effect (meaning this test can be used) for the duration of the COVID-19 declaration under Section 564(b)(1) of the Act, 21 U.S.C. section 360bbb-3(b)(1), unless the authorization is terminated or revoked.  Performed at [facility], [place] [address], [place],  [address]     Radiology Reports DG Chest Port 1 View  Result Date: 06/18/2020 CLINICAL DATA:  Shortness of breath EXAM: PORTABLE CHEST 1 VIEW COMPARISON:  June 17, 2020. FINDINGS: There is a persistent small left pleural effusion with consolidation in the left lower lung region. Small right pleural effusion noted. Focal airspace opacity lateral right base again noted without change. No new opacity evident. There is cardiomegaly with pulmonary vascularity normal. There is aortic atherosclerosis. Patient is status post coronary artery bypass grafting. No appreciable adenopathy by portable radiography. Surgical clips noted in left axilla. IMPRESSION: Stable cardiomegaly. Small pleural effusions bilaterally. Consolidation consistent with atelectasis and potential superimposed pneumonia left lower lobe. Suspect small focus of infiltrate lateral right base. Appearance of the lungs is stable compared to 1 day prior. Status post coronary artery bypass grafting. Aortic Atherosclerosis (ICD10-I70.0). Electronically Signed   By: Lowella Grip III M.D.   On: 06/18/2020 09:35   DG Chest Portable 1 View  Result Date: 06/17/2020 CLINICAL DATA:  Short of breath, hypoxia, history of CHF EXAM: PORTABLE CHEST 1 VIEW COMPARISON:  Prior chest x-ray 03/29/2015 FINDINGS: Enlarged cardiopericardial silhouette consistent with cardiomegaly. Atherosclerotic calcifications visualized in the transverse aorta. Patient is status post median sternotomy for multivessel CABG. Bibasilar airspace opacities are present with blunting of  the costophrenic angles consistent with a combination of layering pleural fluid with atelectasis and/or infiltrate. Mild vascular congestion without overt edema. Chronic bronchitic changes and interstitial prominence are similar compared to relatively remote prior imaging. No pneumothorax. Surgical clips again seen in the left axilla. No acute osseous abnormality. IMPRESSION: 1. Cardiomegaly and pulmonary vascular congestion bordering on mild edema. 2. Bilateral moderate layering pleural effusions with associated bibasilar atelectasis and/or infiltrate. 3. Aortic atherosclerosis. Electronically Signed   By: Dellis Filbert.D.  On: 06/17/2020 12:41    Lab Data:  CBC: Recent Labs  Lab 06/17/20 1126  WBC 9.3  NEUTROABS 7.7  HGB 17.4*  HCT 53.4*  MCV 104.3*  PLT 332   Basic Metabolic Panel: Recent Labs  Lab 06/17/20 1126 06/18/20 0250  NA 133* 136  K 4.7 4.1  CL 98 99  CO2 26 26  GLUCOSE 267* 124*  BUN 21 17  CREATININE 0.92 0.78  CALCIUM 10.1 9.6   GFR: Estimated Creatinine Clearance: 41.1 mL/min (by C-G formula based on SCr of 0.78 mg/dL). Liver Function Tests: No results for input(s): AST, ALT, ALKPHOS, BILITOT, PROT, ALBUMIN in the last 168 hours. No results for input(s): LIPASE, AMYLASE in the last 168 hours. No results for input(s): AMMONIA in the last 168 hours. Coagulation Profile: No results for input(s): INR, PROTIME in the last 168 hours. Cardiac Enzymes: No results for input(s): CKTOTAL, CKMB, CKMBINDEX, TROPONINI in the last 168 hours. BNP (last 3 results) No results for input(s): PROBNP in the last 8760 hours. HbA1C: Recent Labs    06/17/20 1905  HGBA1C 8.2*   CBG: Recent Labs  Lab 06/17/20 1643 06/17/20 1743 06/17/20 2207 06/18/20 0611  GLUCAP 171* 207* 212* 152*   Lipid Profile: No results for input(s): CHOL, HDL, LDLCALC, TRIG, CHOLHDL, LDLDIRECT in the last 72 hours. Thyroid Function Tests: Recent Labs    06/17/20 1905  TSH 2.175    Anemia Panel: No results for input(s): VITAMINB12, FOLATE, FERRITIN, TIBC, IRON, RETICCTPCT in the last 72 hours. Urine analysis:    Component Value Date/Time   COLORURINE AMBER (A) 03/22/2014 1509   APPEARANCEUR CLOUDY (A) 03/22/2014 1509   LABSPEC 1.024 03/22/2014 1509   PHURINE 5.0 03/22/2014 1509   GLUCOSEU NEGATIVE 03/22/2014 1509   HGBUR NEGATIVE 03/22/2014 1509   BILIRUBINUR SMALL (A) 03/22/2014 1509   KETONESUR 15 (A) 03/22/2014 1509   PROTEINUR 30 (A) 03/22/2014 1509   UROBILINOGEN 0.2 03/22/2014 1509   NITRITE NEGATIVE 03/22/2014 1509   LEUKOCYTESUR SMALL (A) 03/22/2014 1509     Elmina Hendel M.D. [facility] 06/18/2020, 10:02 AM  Available via Epic secure chat 7am-7pm After 7 pm, please refer to night coverage provider listed on amion.

## 2020-06-18 NOTE — Plan of Care (Signed)

## 2020-06-18 NOTE — Progress Notes (Signed)
Progress Note  Patient Name: Marissa Jefferson Date of Encounter: 06/19/2020  [facility] Cardiologist: Sanda Klein, MD   Subjective   Breathing improving. States she is urinating a lot to the lasix.   TTE with preserved LVEF, RV enlarged with severely reduced systolic function, severe PASP (unclear if new or chronic; last TTE in 2016 with mildly dilated RV but systolic function better, mild TR)  Blood pressure controlled this AM 110-120s/70s Cr rising slightly to 1.04; BNP 700 Net negative 2.7L Wt inaccurate  Inpatient Medications    Scheduled Meds: . aspirin EC  81 mg Oral Daily  . atorvastatin  80 mg Oral QPM  . carvedilol  25 mg Oral BID WC  . cholecalciferol  5,000 Units Oral Daily  . darifenacin  15 mg Oral Daily  . enoxaparin (LOVENOX) injection  40 mg Subcutaneous Q24H  . [START ON 06/20/2020] furosemide  20 mg Intravenous Daily  . insulin aspart  0-15 Units Subcutaneous TID WC  . levothyroxine  150 mcg Oral Daily  . magnesium oxide  400 mg Oral BID  . sodium chloride flush  3 mL Intravenous Q12H   Continuous Infusions: . sodium chloride     PRN Meds: sodium chloride, acetaminophen, diclofenac Sodium, docusate sodium, hydrALAZINE, lip balm, ondansetron (ZOFRAN) IV, sodium chloride flush   Vital Signs    Vitals:   06/19/20 0040 06/19/20 0217 06/19/20 0417 06/19/20 0721  BP: (!) 148/84 (!) 143/97 127/74 117/77  Pulse: 65 89 69 70  Resp: 19 16 18 18   Temp: 97.6 F (36.4 C) 97.7 F (36.5 C) 98.1 F (36.7 C) 97.8 F (36.6 C)  TempSrc: Oral Oral Oral Oral  SpO2: 93% 90% 93% 93%  Weight:  62.6 kg    Height:        Intake/Output Summary (Last 24 hours) at 06/19/2020 0845 Last data filed at 06/19/2020 4166 Gross per 24 hour  Intake 623 ml  Output 3450 ml  Net -2827 ml   Last 3 Weights 06/19/2020 06/18/2020 06/17/2020  Weight (lbs) 138 lb 0.1 oz 152 lb 8.9 oz 143 lb 3.2 oz  Weight (kg) 62.6 kg 69.2 kg 64.955 kg      Telemetry    NSR with occasional PVCs  - Personally Reviewed  ECG    No new tracing - Personally Reviewed  Physical Exam   GEN: Elderly female, NAD  Neck: Elevated JVD to angle of the mandible with prominent v-waves Cardiac: RRR, 2/6 early peaking systolic murmur, no rubs or gallops Respiratory: Rales halfway up the lung fields, more notable on the left GI: Soft, nontender, non-distended  MS: No edema; No deformity. Neuro:  Nonfocal  Psych: Normal affect   Labs    High Sensitivity Troponin:   Recent Labs  Lab 06/17/20 1126 06/17/20 1426  TROPONINIHS 33* 28*      Chemistry Recent Labs  Lab 06/17/20 1126 06/18/20 0250 06/19/20 0155  NA 133* 136 138  K 4.7 4.1 4.0  CL 98 99 96*  CO2 26 26 37*  GLUCOSE 267* 124* 126*  BUN 21 17 16   CREATININE 0.92 0.78 1.04*  CALCIUM 10.1 9.6 10.0  GFRNONAA 58* >60 50*  ANIONGAP 9 11 5      Hematology Recent Labs  Lab 06/17/20 1126  WBC 9.3  RBC 5.12*  HGB 17.4*  HCT 53.4*  MCV 104.3*  MCH 34.0  MCHC 32.6  RDW 13.8  PLT 209    BNP Recent Labs  Lab 06/17/20 1127 06/19/20 0155  BNP  892.4* 700.5*     DDimer No results for input(s): DDIMER in the last 168 hours.   Radiology    DG Chest Port 1 View  Result Date: 06/18/2020 CLINICAL DATA:  Shortness of breath EXAM: PORTABLE CHEST 1 VIEW COMPARISON:  June 17, 2020. FINDINGS: There is a persistent small left pleural effusion with consolidation in the left lower lung region. Small right pleural effusion noted. Focal airspace opacity lateral right base again noted without change. No new opacity evident. There is cardiomegaly with pulmonary vascularity normal. There is aortic atherosclerosis. Patient is status post coronary artery bypass grafting. No appreciable adenopathy by portable radiography. Surgical clips noted in left axilla. IMPRESSION: Stable cardiomegaly. Small pleural effusions bilaterally. Consolidation consistent with atelectasis and potential superimposed pneumonia left lower lobe. Suspect small  focus of infiltrate lateral right base. Appearance of the lungs is stable compared to 1 day prior. Status post coronary artery bypass grafting. Aortic Atherosclerosis (ICD10-I70.0). Electronically Signed   By: Lowella Grip III M.D.   On: 06/18/2020 09:35   DG Chest Portable 1 View  Result Date: 06/17/2020 CLINICAL DATA:  Short of breath, hypoxia, history of CHF EXAM: PORTABLE CHEST 1 VIEW COMPARISON:  Prior chest x-ray 03/29/2015 FINDINGS: Enlarged cardiopericardial silhouette consistent with cardiomegaly. Atherosclerotic calcifications visualized in the transverse aorta. Patient is status post median sternotomy for multivessel CABG. Bibasilar airspace opacities are present with blunting of the costophrenic angles consistent with a combination of layering pleural fluid with atelectasis and/or infiltrate. Mild vascular congestion without overt edema. Chronic bronchitic changes and interstitial prominence are similar compared to relatively remote prior imaging. No pneumothorax. Surgical clips again seen in the left axilla. No acute osseous abnormality. IMPRESSION: 1. Cardiomegaly and pulmonary vascular congestion bordering on mild edema. 2. Bilateral moderate layering pleural effusions with associated bibasilar atelectasis and/or infiltrate. 3. Aortic atherosclerosis. Electronically Signed   By: Jacqulynn Cadet M.D.   On: 06/17/2020 12:41   ECHOCARDIOGRAM COMPLETE  Result Date: 06/18/2020    ECHOCARDIOGRAM REPORT   Patient Name:   Marissa Jefferson Date of Exam: 06/18/2020 Medical Rec #:  [id number]  Height:       66.0 in Accession #:    [id number] Weight:       152.6 lb Date of Birth:  Aug 01, 1924  BSA:          1.782 m Patient Age:    87 years   BP:           122/75 mmHg Patient Gender: F          HR:           85 bpm. Exam Location:  Inpatient Procedure: 2D Echo, Cardiac Doppler and Color Doppler Indications:    CHF  History:        Patient has prior history of Echocardiogram examinations, most                  recent 03/22/2014. CHF; Prior CABG. CABG 10/09/2013.  Sonographer:    Hunter Referring Phys: [id number] [place]  1. Left ventricular ejection fraction, by estimation, is 55 to 60%. The left ventricle has normal function. The left ventricle has no regional wall motion abnormalities. There is moderate left ventricular hypertrophy. Left ventricular diastolic parameters are consistent with Grade II diastolic dysfunction (pseudonormalization). Elevated left ventricular end-diastolic pressure. There is the interventricular septum is flattened in diastole ('D' shaped left ventricle), consistent with right ventricular volume overload and the interventricular septum is flattened in systole, consistent  with right ventricular pressure overload.  2. Right ventricular systolic function is severely reduced. The right ventricular size is mildly enlarged. There is severely elevated pulmonary artery systolic pressure. The estimated right ventricular systolic pressure is 16.1 mmHg.  3. Left atrial size was mildly dilated.  4. Large pleural effusion in the left lateral region.  5. The mitral valve is grossly normal. Mild mitral valve regurgitation.  6. Tricuspid valve regurgitation is moderate.  7. The NCC and the LCC are fused. . The aortic valve is tricuspid. Aortic valve regurgitation is not visualized. Mild aortic valve stenosis.  8. The inferior vena cava is dilated in size with <50% respiratory variability, suggesting right atrial pressure of 15 mmHg. FINDINGS  Left Ventricle: Left ventricular ejection fraction, by estimation, is 55 to 60%. The left ventricle has normal function. The left ventricle has no regional wall motion abnormalities. The left ventricular internal cavity size was normal in size. There is  moderate left ventricular hypertrophy. The interventricular septum is flattened in diastole ('D' shaped left ventricle), consistent with right ventricular volume overload and the interventricular  septum is flattened in systole, consistent with right ventricular pressure overload. Left ventricular diastolic parameters are consistent with Grade II diastolic dysfunction (pseudonormalization). Elevated left ventricular end-diastolic pressure. Right Ventricle: The right ventricular size is mildly enlarged. Right vetricular wall thickness was not well visualized. Right ventricular systolic function is severely reduced. There is severely elevated pulmonary artery systolic pressure. The tricuspid  regurgitant velocity is 3.47 m/s, and with an assumed right atrial pressure of 15 mmHg, the estimated right ventricular systolic pressure is 09.6 mmHg. Left Atrium: Left atrial size was mildly dilated. Right Atrium: Right atrial size was normal in size. Pericardium: There is no evidence of pericardial effusion. Mitral Valve: The mitral valve is grossly normal. There is mild thickening of the mitral valve leaflet(s). There is mild calcification of the mitral valve leaflet(s). Mild mitral annular calcification. Mild mitral valve regurgitation. Tricuspid Valve: The tricuspid valve is normal in structure. Tricuspid valve regurgitation is moderate. Aortic Valve: The [place] and the [place] are fused. The aortic valve is tricuspid. Aortic valve regurgitation is not visualized. Mild aortic stenosis is present. Aortic valve mean gradient measures 7.0 mmHg. Aortic valve peak gradient measures 10.4 mmHg. Aortic  valve area, by VTI measures 1.40 cm. Pulmonic Valve: The pulmonic valve was normal in structure. Pulmonic valve regurgitation is mild. Aorta: The aortic root and ascending aorta are structurally normal, with no evidence of dilitation. Venous: The inferior vena cava is dilated in size with less than 50% respiratory variability, suggesting right atrial pressure of 15 mmHg. IAS/Shunts: No atrial level shunt detected by color flow Doppler. Additional Comments: There is a large pleural effusion in the left lateral region.  LEFT  VENTRICLE PLAX 2D LVIDd:         3.60 cm     Diastology LVIDs:         2.60 cm     LV e' medial:    2.81 cm/s LV PW:         1.25 cm     LV E/e' medial:  27.7 LV IVS:        1.50 cm     LV e' lateral:   2.69 cm/s LVOT diam:     2.00 cm     LV E/e' lateral: 29.0 LV SV:         50 LV SV Index:   28 LVOT Area:     3.14 cm  LV Volumes (MOD) LV vol d, MOD A2C: 59.8 ml LV vol d, MOD A4C: 46.4 ml LV vol s, MOD A4C: 21.5 ml LV SV MOD A4C:     46.4 ml RIGHT VENTRICLE RV S prime:     5.93 cm/s  PULMONARY VEINS TAPSE (M-mode): 0.7 cm     A Reversal Duration: 53.00 msec                            A Reversal Velocity: 26.10 cm/s                            Diastolic Velocity:  76.73 cm/s                            S/D Velocity:        1.50                            Systolic Velocity:   41.93 cm/s LEFT ATRIUM             Index       RIGHT ATRIUM           Index LA diam:        3.80 cm 2.13 cm/m  RA Area:     17.30 cm LA Vol (A2C):   51.7 ml 29.01 ml/m RA Volume:   50.00 ml  28.05 ml/m LA Vol (A4C):   62.5 ml 35.06 ml/m LA Biplane Vol: 62.4 ml 35.01 ml/m  AORTIC VALVE                    PULMONIC VALVE AV Area (Vmax):    1.60 cm     PV Vmax:       0.86 m/s AV Area (Vmean):   1.30 cm     PV Vmean:      54.600 cm/s AV Area (VTI):     1.40 cm     PV VTI:        0.132 m AV Vmax:           161.00 cm/s  PV Peak grad:  2.9 mmHg AV Vmean:          131.000 cm/s PV Mean grad:  1.0 mmHg AV VTI:            0.354 m AV Peak Grad:      10.4 mmHg AV Mean Grad:      7.0 mmHg LVOT Vmax:         82.20 cm/s LVOT Vmean:        54.400 cm/s LVOT VTI:          0.158 m LVOT/AV VTI ratio: 0.45  AORTA Ao Root diam: 3.50 cm Ao Asc diam:  3.50 cm MITRAL VALVE               TRICUSPID VALVE MV Area (PHT): 2.99 cm    TR Peak grad:   48.2 mmHg MV Decel Time: 254 msec    TR Vmax:        347.00 cm/s MV E velocity: 77.90 cm/s MV A velocity: 90.90 cm/s  SHUNTS MV E/A ratio:  0.86        Systemic VTI:  0.16 m  Systemic Diam: 2.00 cm  Mertie Moores MD Electronically signed by Mertie Moores MD Signature Date/Time: 06/18/2020/11:40:52 AM    Final     Cardiac Studies  TTE 06/18/20: IMPRESSIONS  1. Left ventricular ejection fraction, by estimation, is 55 to 60%. The  left ventricle has normal function. The left ventricle has no regional  wall motion abnormalities. There is moderate left ventricular hypertrophy.  Left ventricular diastolic  parameters are consistent with Grade II diastolic dysfunction  (pseudonormalization). Elevated left ventricular end-diastolic pressure.  There is the interventricular septum is flattened in diastole ('D' shaped  left ventricle), consistent with right  ventricular volume overload and the interventricular septum is flattened  in systole, consistent with right ventricular pressure overload.  2. Right ventricular systolic function is severely reduced. The right  ventricular size is mildly enlarged. There is severely elevated pulmonary  artery systolic pressure. The estimated right ventricular systolic  pressure is 69.6 mmHg.  3. Left atrial size was mildly dilated.  4. Large pleural effusion in the left lateral region.  5. The mitral valve is grossly normal. Mild mitral valve regurgitation.  6. Tricuspid valve regurgitation is moderate.  7. The NCC and the LCC are fused. . The aortic valve is tricuspid. Aortic  valve regurgitation is not visualized. Mild aortic valve stenosis.  8. The inferior vena cava is dilated in size with <50% respiratory  variability, suggesting right atrial pressure of 15 mmHg.  TTE 2016:  Study Conclusions   - Left ventricle: Inferobasal hypokinesis The cavity size was  normal. Wall thickness was increased in a pattern of moderate  LVH. The estimated ejection fraction was 55%.  - Aortic valve: small systolic gradient with no significant  stenosis  - Mitral valve: There was mild regurgitation.  - Right ventricle: The cavity size was mildly dilated.   - Atrial septum: No defect or patent foramen ovale was identified.  - Pulmonary arteries: PA peak pressure: 46 mm Hg (S).   Patient Profile     87 y.o. female with a hx of chronic diastolic heart failure, HTN, HLD, CAD s/p 3v CABG, and DMII who presented with worsening SOB found to have acute on chronic diastolic heart failure exacerbation   Assessment & Plan    #Acute on chronic HF exacerbation: Patient presented with worsening SOB x 2 weeks found to be volume overloaded with pulmonary edema on CXR and BNP 900. Trop flat and ECG without ischemic changes. Initially required BiPAP now improved and weaned to [facility]. -Decrease lasix to 40mg  daily given rise in Cr overnight with brisk diuresis -TTE with preserved LVEF, but severe RV systolic dysfunction with severe PAH (discussed below) -Continue coreg 25mg  BID -Hold ACE-I due to rise in Cr -Monitor I/Os and daily weights -Low Na diet  #Severe Pulmonary HTN: #Right heart failure: TTE with significant RV dysfunction and mild RV enlargement with severe pulmonary HTN. Last echo in our system in 2016 showed mild RV dilation but systolic function appears better; only mild TR. May be related to chronic HFpEF although given acute decompensation, would like to rule out PE.  -Likely needs CT-PE protocol unless d-dimer normal -Continue diuresis as above -Hold ACE-I due to rise in Cr  #Multivessel CAD s/p CABG in 2015: EF 55% on last TTE in 2016 with repeat pending. Low suspicion for underlying ischemic etiology of acute HFpEF exacerbation. -Continue ASA 81mg  daily -Continue atorvastatin 80mg  daily -Continue coreg -Hold ACE-I due to slight rise in Cr  #Mild AS: Mean gradient 32mmHg, AVA by  continuity 1.4cm2 -Monitor as out-patient  #HTN: -Continue coreg as above -Hold ACE-I due to slight rise in Cr  #HLD: -Continue lipitor as above  #DMII: -Management per primary      For questions or updates, please contact [facility] Please  consult [email or web address] for contact info under        Signed, Freada Bergeron, MD  06/19/2020, 8:45 AM

## 2020-06-18 NOTE — Evaluation (Signed)
Physical Therapy Evaluation Patient Details Name: Marissa Jefferson MRN: [id number] DOB: [birth year] Today's Date: 06/18/2020   History of Present Illness  87 y.o. female presents to [facility] ED from [place] office on 06/17/2020 with reports of SOB, hypoxia, and BLE edema. Chest x-ray demonstrates pulmonary edema. PMH includes chronic diastolic CHF, HTN, HLD, CAD status post triple bypass, hypothyroidism, IIDM.  Clinical Impression  Pt presents to PT with deficits in functional mobility, gait, balance, power, strength, endurance. Pt requires encouragement to participate in PT session and out of bed mobility. PT provides education on benefits of mobility and upright positioning for strength and pulmonary capacity. Pt currently requires assistance for safety and DME management, appearing generally weak. Pt refuses to ambulate other than to recliner. Pt's weakness and endurance deficits both place her at an increased falls risk at this time. Pt will benefit from further gait assessment next session to better assess mobility quality as well as endurance. Pt must ascend 20 steps to enter home and will also benefit from stair negotiation assessment prior to discharge. PT currently recommends discharge home with HHPT services and assistance from family.    Follow Up Recommendations Home health PT;Supervision - Intermittent    Equipment Recommendations  None recommended by PT    Recommendations for Other Services       Precautions / Restrictions Precautions Precautions: Fall;Other (comment) Precaution Comments: monitor SpO2 Restrictions Weight Bearing Restrictions: No      Mobility  Bed Mobility Overal bed mobility: Needs Assistance Bed Mobility: Supine to Sit     Supine to sit: Supervision;HOB elevated          Transfers Overall transfer level: Needs assistance Equipment used: Rolling walker (2 wheeled) Transfers: Sit to/from [facility] Sit to Stand: Min assist Stand pivot  transfers: Min guard       General transfer comment: increased time, cues for hand placement  Ambulation/Gait Ambulation/Gait assistance: Min guard Gait Distance (Feet): 2 Feet Assistive device: Rolling walker (2 wheeled) Gait Pattern/deviations: Step-to pattern Gait velocity: reduced Gait velocity interpretation: <1.31 ft/sec, indicative of household ambulator General Gait Details: pt with short step-to gait  Stairs            Wheelchair Mobility    Modified Rankin (Stroke Patients Only)       Balance Overall balance assessment: Needs assistance Sitting-balance support: No upper extremity supported;Feet supported Sitting balance-Leahy Scale: Good     Standing balance support: Single extremity supported;Bilateral upper extremity supported Standing balance-Leahy Scale: Poor Standing balance comment: reliant on UE support of RW                             Pertinent Vitals/Pain Pain Assessment: No/denies pain    Home Living Family/patient expects to be discharged to:: Private residence Living Arrangements: Alone Available Help at Discharge: Family;Friend(s);Available PRN/intermittently Type of Home: Apartment Home Access: Stairs to enter Entrance Stairs-Rails: Left Entrance Stairs-Number of Steps: 20 Home Layout: One level Home Equipment: Walker - 4 wheels;Cane - single point Additional Comments: pt sponge bathes    Prior Function Level of Independence: Needs assistance   Gait / Transfers Assistance Needed: pt reports ambulating household distances independently with use of Rollator, pt requires assistance to negotiate stairs  ADL's / Homemaking Assistance Needed: pt performs sponge baths, requires assistance for IADLs        Hand Dominance   Dominant Hand: Right    Extremity/Trunk Assessment   Upper Extremity Assessment Upper Extremity  Assessment: Generalized weakness    Lower Extremity Assessment Lower Extremity Assessment:  Generalized weakness    Cervical / Trunk Assessment Cervical / Trunk Assessment: Kyphotic  Communication   Communication: No difficulties  Cognition Arousal/Alertness: Awake/alert Behavior During Therapy: WFL for tasks assessed/performed Overall Cognitive Status: Within Functional Limits for tasks assessed                                        General Comments General comments (skin integrity, edema, etc.): pt on 6L [place] upon PT arrival, HR in 70-80s. PT unable to obtain accurate SpO2 reading as portable pulse ox reading HR in 30s despite pt HR monitor reading HR in 70s. Pt does not demonstrates significant increase in RR or report any DOE with limited mobility    Exercises     Assessment/Plan    PT Assessment Patient needs continued PT services  PT Problem List Decreased strength;Decreased activity tolerance;Decreased mobility;Decreased balance;Cardiopulmonary status limiting activity       PT Treatment Interventions DME instruction;Gait training;Stair training;Functional mobility training;Therapeutic activities;Therapeutic exercise;Balance training;Patient/family education    PT Goals (Current goals can be found in the Care Plan section)  Acute Rehab PT Goals Patient Stated Goal: to improve activity tolerance and return home PT Goal Formulation: With patient Time For Goal Achievement: 07/02/20 Potential to Achieve Goals: Good    Frequency Min 3X/week   Barriers to discharge        Co-evaluation               AM-PAC PT "6 Clicks" Mobility  Outcome Measure Help needed turning from your back to your side while in a flat bed without using bedrails?: A Little Help needed moving from lying on your back to sitting on the side of a flat bed without using bedrails?: A Little Help needed moving to and from a bed to a chair (including a wheelchair)?: A Little Help needed standing up from a chair using your arms (e.g., wheelchair or bedside chair)?: A  Little Help needed to walk in hospital room?: A Little Help needed climbing 3-5 steps with a railing? : A Lot 6 Click Score: 17    End of Session Equipment Utilized During Treatment: Oxygen Activity Tolerance: Patient tolerated treatment well Patient left: in chair;with call bell/phone within reach;with chair alarm set Nurse Communication: Mobility status PT Visit Diagnosis: Other abnormalities of gait and mobility (R26.89);Muscle weakness (generalized) (M62.81)    Time: 1331-1400 PT Time Calculation (min) (ACUTE ONLY): 29 min   Charges:   PT Evaluation $PT Eval Low Complexity: 1 Low          Zenaida Niece, PT, DPT Acute Rehabilitation Pager: [phone]   Zenaida Niece 06/18/2020, 2:21 PM

## 2020-06-19 ENCOUNTER — Inpatient Hospital Stay (HOSPITAL_COMMUNITY): Payer: Medicare HMO

## 2020-06-19 DIAGNOSIS — I10 Essential (primary) hypertension: Secondary | ICD-10-CM | POA: Diagnosis not present

## 2020-06-19 DIAGNOSIS — I5021 Acute systolic (congestive) heart failure: Secondary | ICD-10-CM

## 2020-06-19 DIAGNOSIS — I509 Heart failure, unspecified: Secondary | ICD-10-CM | POA: Diagnosis not present

## 2020-06-19 DIAGNOSIS — J9601 Acute respiratory failure with hypoxia: Secondary | ICD-10-CM | POA: Diagnosis not present

## 2020-06-19 DIAGNOSIS — I2510 Atherosclerotic heart disease of native coronary artery without angina pectoris: Secondary | ICD-10-CM | POA: Diagnosis not present

## 2020-06-19 DIAGNOSIS — E785 Hyperlipidemia, unspecified: Secondary | ICD-10-CM | POA: Diagnosis not present

## 2020-06-19 LAB — BASIC METABOLIC PANEL
Anion gap: 5 (ref 5–15)
BUN: 16 mg/dL (ref 8–23)
CO2: 37 mmol/L — ABNORMAL HIGH (ref 22–32)
Calcium: 10 mg/dL (ref 8.9–10.3)
Chloride: 96 mmol/L — ABNORMAL LOW (ref 98–111)
Creatinine, Ser: 1.04 mg/dL — ABNORMAL HIGH (ref 0.44–1.00)
GFR, Estimated: 50 mL/min — ABNORMAL LOW (ref >60)
Glucose, Bld: 126 mg/dL — ABNORMAL HIGH (ref 70–99)
Potassium: 4 mmol/L (ref 3.5–5.1)
Sodium: 138 mmol/L (ref 135–145)

## 2020-06-19 LAB — GLUCOSE, CAPILLARY
Glucose-Capillary: 167 mg/dL — ABNORMAL HIGH (ref 70–99)
Glucose-Capillary: 192 mg/dL — ABNORMAL HIGH (ref 70–99)
Glucose-Capillary: 228 mg/dL — ABNORMAL HIGH (ref 70–99)
Glucose-Capillary: 253 mg/dL — ABNORMAL HIGH (ref 70–99)

## 2020-06-19 LAB — BRAIN NATRIURETIC PEPTIDE: B Natriuretic Peptide: 700.5 pg/mL — ABNORMAL HIGH (ref 0.0–100.0)

## 2020-06-19 LAB — D-DIMER, QUANTITATIVE: D-Dimer, Quant: 0.87 ug/mL-FEU — ABNORMAL HIGH (ref 0.00–0.50)

## 2020-06-19 MED ORDER — IOHEXOL 350 MG/ML SOLN
100.0000 mL | Freq: Once | INTRAVENOUS | Status: AC | PRN
  Administered 2020-06-19: 100 mL via INTRAVENOUS

## 2020-06-19 MED ORDER — FUROSEMIDE 10 MG/ML IJ SOLN: 40.0000 mg | Freq: Every day | INTRAMUSCULAR | Status: DC

## 2020-06-19 MED ORDER — FUROSEMIDE 10 MG/ML IJ SOLN
20.0000 mg | Freq: Every day | INTRAMUSCULAR | Status: DC
  Administered 2020-06-20 – 2020-06-21 (×2): 20 mg via INTRAVENOUS
  Filled 2020-06-19 (×2): qty 2

## 2020-06-19 NOTE — Progress Notes (Addendum)
[facility]                                                                              Patient Demographics  Marissa Jefferson, is a 87 y.o. female, DOB - [birth year], ZHY:[id number]  Admit date - 06/17/2020   Admitting Physician Lequita Halt, MD  Outpatient Primary MD for the patient is Merrilee Seashore, MD  Outpatient specialists:   LOS - 2  days   Medical records reviewed and are as summarized below:    Chief Complaint  Patient presents with  . Shortness of Breath       Brief summary   Patient is a 87 year old female with history of chronic diastolic CHF, hypertension, hyperlipidemia, CAD s/p 3v CABG, hypothyroidism, DM presented with increasing shortness of breath for last 2 weeks. Patient ported symptoms started 2 weeks ago and gradually worsening, initially exertional.  Then developed constant shortness of breath in the last 2 days, orthopnea at night.  Patient also noted her shoes do not fit. In ED, patient was found to have O2 sats in 70s, improved to 80s on 5 L O2, failed BiPAP trial.  Improved Admitted for further work-up  Assessment & Plan    Principal Problem:   Acute respiratory failure with hypoxia (HCC) likely secondary to acute on chronic diastolic CHF, right heart failure -Presented with fluid overload, hypoxia, BNP elevated 892 -Patient was placed on Lasix 40 mg every 12 hours, negative balance of 4.7 L. -O2 sats O2 sats 95% on 6 L via [place] -Creatinine slightly trended up to 1.0, Lasix decreased  -Cardiology following - 2DEcho: EF 55-60%, G2DD, right heart failure, large left pleural effusion, severe pulmonary hypertension -D-dimer 0.87, follow CT angiogram of the chest to rule out PE Addendum: 3:30PM CTA negative for PE  Active Problems: Left pleural effusion -Repeat chest x-ray in a.m., if layering pleural effusion persisting despite diuresis, will obtain thoracentesis    Essential hypertension -BP stable, hold lisinopril and Norvasc.    -Continue Coreg, Lasix -Hydralazine as needed for elevated BP    Hypercholesterolemia -Continue Lipitor    Diabetes mellitus type 2, uncontrolled ([place]), not on long-term insulin with hypoglycemia -Hold Metformin, continue sliding scale insulin -Hemoglobin A1c 8.2 -Will benefit from SGLT2 at discharge    Coronary artery disease S/P CABG -2D echo with right heart failure, preserved EF 55 to 60% -Troponins mildly elevated due to demand ischemia from #1    Hypothyroidism -Continue Synthroid, TSH 2.1    Pressure injury of skin Stage I, medial sacrum, present on admission Wound care per nursing  Generalized debility -PT OT evaluation   Code Status: Full CODE STATUS DVT Prophylaxis:  enoxaparin (LOVENOX) injection 40 mg Start: 06/17/20 1604   Level of Care: Level of care: Telemetry Medical Family Communication: Discussed all imaging results, lab results, explained to the patient's daughter, Marissa Jefferson on phone number [phone] today  Disposition Plan:     Status is: Inpatient  Remains inpatient appropriate because:Inpatient level of care appropriate due to severity of illness   Dispo: The patient is from: Home  Anticipated d/c is to: Home              Patient currently is not medically stable to d/c.   Difficult to place patient No      Time Spent in minutes   35 minutes  Procedures:  None  Consultants:    None  Antimicrobials:   Anti-infectives (From admission, onward)   None         Medications  Scheduled Meds: . aspirin EC  81 mg Oral Daily  . atorvastatin  80 mg Oral QPM  . carvedilol  25 mg Oral BID WC  . cholecalciferol  5,000 Units Oral Daily  . darifenacin  15 mg Oral Daily  . enoxaparin (LOVENOX) injection  40 mg Subcutaneous Q24H  . [START ON 06/20/2020] furosemide  20 mg Intravenous Daily  . insulin aspart  0-15 Units Subcutaneous TID WC  . levothyroxine  150 mcg Oral Daily  . magnesium oxide  400 mg Oral BID  .  sodium chloride flush  3 mL Intravenous Q12H   Continuous Infusions: . sodium chloride     PRN Meds:.sodium chloride, acetaminophen, diclofenac Sodium, docusate sodium, hydrALAZINE, lip balm, ondansetron (ZOFRAN) IV, sodium chloride flush      Subjective:   Marissa Jefferson was seen and examined today.  Frustrated with the purewixk issues overnight.  Still on 6 L via [place].  No chest pain.  Patient denies dizziness,  abdominal pain, N/V/D/C, new weakness, numbess, tingling.   Objective:   Vitals:   06/19/20 0217 06/19/20 0417 06/19/20 0721 06/19/20 1206  BP: (!) 143/97 127/74 117/77 (!) 149/62  Pulse: 89 69 70 71  Resp: 16 18 18 18   Temp: 97.7 F (36.5 C) 98.1 F (36.7 C) 97.8 F (36.6 C) 98.1 F (36.7 C)  TempSrc: Oral Oral Oral Oral  SpO2: 90% 93% 93% 95%  Weight: 62.6 kg     Height:        Intake/Output Summary (Last 24 hours) at 06/19/2020 1340 Last data filed at 06/19/2020 2353 Gross per 24 hour  Intake 683 ml  Output 4250 ml  Net -3567 ml     Wt Readings from Last 3 Encounters:  06/19/20 62.6 kg  02/11/20 64.9 kg  01/23/19 64.4 kg    Physical Exam  General: Alert and oriented x 3, NAD  Cardiovascular: S1 S2 clear, 2/6 systolic murmur 1+ pedal edema b/l  Respiratory: Bibasilar Rales  Gastrointestinal: Soft, nontender, nondistended, NBS  Ext: 1+ pedal edema bilaterally  Neuro: no new deficits  Musculoskeletal: No cyanosis, clubbing  Skin: No rashes  Psych: Normal affect and demeanor, alert and oriented x3     Data Reviewed:  I have personally reviewed following labs and imaging studies  Micro Results Recent Results (from the past 240 hour(s))  Resp Panel by RT-PCR (Flu A&B, Covid) Nasopharyngeal Swab     Status: None   Collection Time: 06/17/20  2:20 PM   Specimen: Nasopharyngeal Swab; Nasopharyngeal(NP) swabs in vial transport medium  Result Value Ref Range Status   SARS Coronavirus 2 by RT PCR NEGATIVE NEGATIVE Final    Comment:  (NOTE) SARS-CoV-2 target nucleic acids are NOT DETECTED.  The SARS-CoV-2 RNA is generally detectable in upper respiratory specimens during the acute phase of infection. The lowest concentration of SARS-CoV-2 viral copies this assay can detect is 138 copies/mL. A negative result does not preclude SARS-Cov-2 infection and should not be used as the sole basis for treatment or other patient management decisions. A negative result  may occur with  improper specimen collection/handling, submission of specimen other than nasopharyngeal swab, presence of viral mutation(s) within the areas targeted by this assay, and inadequate number of viral copies(<138 copies/mL). A negative result must be combined with clinical observations, patient history, and epidemiological information. The expected result is Negative.  Fact Sheet for Patients:  EntrepreneurPulse.com.au  Fact Sheet for Healthcare Providers:  IncredibleEmployment.be  This test is no t yet approved or cleared by the [place] FDA and  has been authorized for detection and/or diagnosis of SARS-CoV-2 by FDA under an Emergency Use Authorization (EUA). This EUA will remain  in effect (meaning this test can be used) for the duration of the COVID-19 declaration under Section 564(b)(1) of the Act, 21 U.S.C.section 360bbb-3(b)(1), unless the authorization is terminated  or revoked sooner.       Influenza A by PCR NEGATIVE NEGATIVE Final   Influenza B by PCR NEGATIVE NEGATIVE Final    Comment: (NOTE) The Xpert Xpress SARS-CoV-2/FLU/RSV plus assay is intended as an aid in the diagnosis of influenza from Nasopharyngeal swab specimens and should not be used as a sole basis for treatment. Nasal washings and aspirates are unacceptable for Xpert Xpress SARS-CoV-2/FLU/RSV testing.  Fact Sheet for Patients: EntrepreneurPulse.com.au  Fact Sheet for Healthcare  Providers: IncredibleEmployment.be  This test is not yet approved or cleared by the [place] FDA and has been authorized for detection and/or diagnosis of SARS-CoV-2 by FDA under an Emergency Use Authorization (EUA). This EUA will remain in effect (meaning this test can be used) for the duration of the COVID-19 declaration under Section 564(b)(1) of the Act, 21 U.S.C. section 360bbb-3(b)(1), unless the authorization is terminated or revoked.  Performed at [facility], [place] [address] [place], [place] [address]     Radiology Reports DG Chest Port 1 View  Result Date: 06/18/2020 CLINICAL DATA:  Shortness of breath EXAM: PORTABLE CHEST 1 VIEW COMPARISON:  June 17, 2020. FINDINGS: There is a persistent small left pleural effusion with consolidation in the left lower lung region. Small right pleural effusion noted. Focal airspace opacity lateral right base again noted without change. No new opacity evident. There is cardiomegaly with pulmonary vascularity normal. There is aortic atherosclerosis. Patient is status post coronary artery bypass grafting. No appreciable adenopathy by portable radiography. Surgical clips noted in left axilla. IMPRESSION: Stable cardiomegaly. Small pleural effusions bilaterally. Consolidation consistent with atelectasis and potential superimposed pneumonia left lower lobe. Suspect small focus of infiltrate lateral right base. Appearance of the lungs is stable compared to 1 day prior. Status post coronary artery bypass grafting. Aortic Atherosclerosis (ICD10-I70.0). Electronically Signed   By: Lowella Grip III M.D.   On: 06/18/2020 09:35   DG Chest Portable 1 View  Result Date: 06/17/2020 CLINICAL DATA:  Short of breath, hypoxia, history of CHF EXAM: PORTABLE CHEST 1 VIEW COMPARISON:  Prior chest x-ray 03/29/2015 FINDINGS: Enlarged cardiopericardial silhouette consistent with cardiomegaly. Atherosclerotic calcifications visualized in the  transverse aorta. Patient is status post median sternotomy for multivessel CABG. Bibasilar airspace opacities are present with blunting of the costophrenic angles consistent with a combination of layering pleural fluid with atelectasis and/or infiltrate. Mild vascular congestion without overt edema. Chronic bronchitic changes and interstitial prominence are similar compared to relatively remote prior imaging. No pneumothorax. Surgical clips again seen in the left axilla. No acute osseous abnormality. IMPRESSION: 1. Cardiomegaly and pulmonary vascular congestion bordering on mild edema. 2. Bilateral moderate layering pleural effusions with associated bibasilar atelectasis and/or infiltrate. 3. Aortic  atherosclerosis. Electronically Signed   By: Jacqulynn Cadet M.D.   On: 06/17/2020 12:41   ECHOCARDIOGRAM COMPLETE  Result Date: 06/18/2020    ECHOCARDIOGRAM REPORT   Patient Name:   Marissa Jefferson Date of Exam: 06/18/2020 Medical Rec #:  [id number]  Height:       66.0 in Accession #:    [id number] Weight:       152.6 lb Date of Birth:  07-11-1924  BSA:          1.782 m Patient Age:    68 years   BP:           122/75 mmHg Patient Gender: F          HR:           85 bpm. Exam Location:  Inpatient Procedure: 2D Echo, Cardiac Doppler and Color Doppler Indications:    CHF  History:        Patient has prior history of Echocardiogram examinations, most                 recent 03/22/2014. CHF; Prior CABG. CABG 10/09/2013.  Sonographer:    Brandon Referring Phys: [id number] [place]  1. Left ventricular ejection fraction, by estimation, is 55 to 60%. The left ventricle has normal function. The left ventricle has no regional wall motion abnormalities. There is moderate left ventricular hypertrophy. Left ventricular diastolic parameters are consistent with Grade II diastolic dysfunction (pseudonormalization). Elevated left ventricular end-diastolic pressure. There is the interventricular septum is flattened in  diastole ('D' shaped left ventricle), consistent with right ventricular volume overload and the interventricular septum is flattened in systole, consistent with right ventricular pressure overload.  2. Right ventricular systolic function is severely reduced. The right ventricular size is mildly enlarged. There is severely elevated pulmonary artery systolic pressure. The estimated right ventricular systolic pressure is 03.5 mmHg.  3. Left atrial size was mildly dilated.  4. Large pleural effusion in the left lateral region.  5. The mitral valve is grossly normal. Mild mitral valve regurgitation.  6. Tricuspid valve regurgitation is moderate.  7. The NCC and the LCC are fused. . The aortic valve is tricuspid. Aortic valve regurgitation is not visualized. Mild aortic valve stenosis.  8. The inferior vena cava is dilated in size with <50% respiratory variability, suggesting right atrial pressure of 15 mmHg. FINDINGS  Left Ventricle: Left ventricular ejection fraction, by estimation, is 55 to 60%. The left ventricle has normal function. The left ventricle has no regional wall motion abnormalities. The left ventricular internal cavity size was normal in size. There is  moderate left ventricular hypertrophy. The interventricular septum is flattened in diastole ('D' shaped left ventricle), consistent with right ventricular volume overload and the interventricular septum is flattened in systole, consistent with right ventricular pressure overload. Left ventricular diastolic parameters are consistent with Grade II diastolic dysfunction (pseudonormalization). Elevated left ventricular end-diastolic pressure. Right Ventricle: The right ventricular size is mildly enlarged. Right vetricular wall thickness was not well visualized. Right ventricular systolic function is severely reduced. There is severely elevated pulmonary artery systolic pressure. The tricuspid  regurgitant velocity is 3.47 m/s, and with an assumed right atrial  pressure of 15 mmHg, the estimated right ventricular systolic pressure is 46.5 mmHg. Left Atrium: Left atrial size was mildly dilated. Right Atrium: Right atrial size was normal in size. Pericardium: There is no evidence of pericardial effusion. Mitral Valve: The mitral valve is grossly normal. There is mild thickening of the  mitral valve leaflet(s). There is mild calcification of the mitral valve leaflet(s). Mild mitral annular calcification. Mild mitral valve regurgitation. Tricuspid Valve: The tricuspid valve is normal in structure. Tricuspid valve regurgitation is moderate. Aortic Valve: The [place] and the [place] are fused. The aortic valve is tricuspid. Aortic valve regurgitation is not visualized. Mild aortic stenosis is present. Aortic valve mean gradient measures 7.0 mmHg. Aortic valve peak gradient measures 10.4 mmHg. Aortic  valve area, by VTI measures 1.40 cm. Pulmonic Valve: The pulmonic valve was normal in structure. Pulmonic valve regurgitation is mild. Aorta: The aortic root and ascending aorta are structurally normal, with no evidence of dilitation. Venous: The inferior vena cava is dilated in size with less than 50% respiratory variability, suggesting right atrial pressure of 15 mmHg. IAS/Shunts: No atrial level shunt detected by color flow Doppler. Additional Comments: There is a large pleural effusion in the left lateral region.  LEFT VENTRICLE PLAX 2D LVIDd:         3.60 cm     Diastology LVIDs:         2.60 cm     LV e' medial:    2.81 cm/s LV PW:         1.25 cm     LV E/e' medial:  27.7 LV IVS:        1.50 cm     LV e' lateral:   2.69 cm/s LVOT diam:     2.00 cm     LV E/e' lateral: 29.0 LV SV:         50 LV SV Index:   28 LVOT Area:     3.14 cm  LV Volumes (MOD) LV vol d, MOD A2C: 59.8 ml LV vol d, MOD A4C: 46.4 ml LV vol s, MOD A4C: 21.5 ml LV SV MOD A4C:     46.4 ml RIGHT VENTRICLE RV S prime:     5.93 cm/s  PULMONARY VEINS TAPSE (M-mode): 0.7 cm     A Reversal Duration: 53.00 msec                             A Reversal Velocity: 26.10 cm/s                            Diastolic Velocity:  70.26 cm/s                            S/D Velocity:        1.50                            Systolic Velocity:   37.85 cm/s LEFT ATRIUM             Index       RIGHT ATRIUM           Index LA diam:        3.80 cm 2.13 cm/m  RA Area:     17.30 cm LA Vol (A2C):   51.7 ml 29.01 ml/m RA Volume:   50.00 ml  28.05 ml/m LA Vol (A4C):   62.5 ml 35.06 ml/m LA Biplane Vol: 62.4 ml 35.01 ml/m  AORTIC VALVE                    PULMONIC VALVE AV Area (Vmax):  1.60 cm     PV Vmax:       0.86 m/s AV Area (Vmean):   1.30 cm     PV Vmean:      54.600 cm/s AV Area (VTI):     1.40 cm     PV VTI:        0.132 m AV Vmax:           161.00 cm/s  PV Peak grad:  2.9 mmHg AV Vmean:          131.000 cm/s PV Mean grad:  1.0 mmHg AV VTI:            0.354 m AV Peak Grad:      10.4 mmHg AV Mean Grad:      7.0 mmHg LVOT Vmax:         82.20 cm/s LVOT Vmean:        54.400 cm/s LVOT VTI:          0.158 m LVOT/AV VTI ratio: 0.45  AORTA Ao Root diam: 3.50 cm Ao Asc diam:  3.50 cm MITRAL VALVE               TRICUSPID VALVE MV Area (PHT): 2.99 cm    TR Peak grad:   48.2 mmHg MV Decel Time: 254 msec    TR Vmax:        347.00 cm/s MV E velocity: 77.90 cm/s MV A velocity: 90.90 cm/s  SHUNTS MV E/A ratio:  0.86        Systemic VTI:  0.16 m                            Systemic Diam: 2.00 cm Mertie Moores MD Electronically signed by Mertie Moores MD Signature Date/Time: 06/18/2020/11:40:52 AM    Final     Lab Data:  CBC: Recent Labs  Lab 06/17/20 1126  WBC 9.3  NEUTROABS 7.7  HGB 17.4*  HCT 53.4*  MCV 104.3*  PLT 423   Basic Metabolic Panel: Recent Labs  Lab 06/17/20 1126 06/18/20 0250 06/19/20 0155  NA 133* 136 138  K 4.7 4.1 4.0  CL 98 99 96*  CO2 26 26 37*  GLUCOSE 267* 124* 126*  BUN 21 17 16   CREATININE 0.92 0.78 1.04*  CALCIUM 10.1 9.6 10.0   GFR: Estimated Creatinine Clearance: 31.6 mL/min (A) (by C-G formula based on SCr  of 1.04 mg/dL (H)). Liver Function Tests: No results for input(s): AST, ALT, ALKPHOS, BILITOT, PROT, ALBUMIN in the last 168 hours. No results for input(s): LIPASE, AMYLASE in the last 168 hours. No results for input(s): AMMONIA in the last 168 hours. Coagulation Profile: No results for input(s): INR, PROTIME in the last 168 hours. Cardiac Enzymes: No results for input(s): CKTOTAL, CKMB, CKMBINDEX, TROPONINI in the last 168 hours. BNP (last 3 results) No results for input(s): PROBNP in the last 8760 hours. HbA1C: Recent Labs    06/17/20 1905  HGBA1C 8.2*   CBG: Recent Labs  Lab 06/18/20 1147 06/18/20 1623 06/18/20 2054 06/19/20 0614 06/19/20 1205  GLUCAP 145* 158* 166* 167* 192*   Lipid Profile: No results for input(s): CHOL, HDL, LDLCALC, TRIG, CHOLHDL, LDLDIRECT in the last 72 hours. Thyroid Function Tests: Recent Labs    06/17/20 1905  TSH 2.175   Anemia Panel: No results for input(s): VITAMINB12, FOLATE, FERRITIN, TIBC, IRON, RETICCTPCT in the last 72 hours. Urine analysis:    Component Value Date/Time  COLORURINE AMBER (A) 03/22/2014 1509   APPEARANCEUR CLOUDY (A) 03/22/2014 1509   LABSPEC 1.024 03/22/2014 1509   PHURINE 5.0 03/22/2014 1509   GLUCOSEU NEGATIVE 03/22/2014 1509   HGBUR NEGATIVE 03/22/2014 1509   BILIRUBINUR SMALL (A) 03/22/2014 1509   KETONESUR 15 (A) 03/22/2014 1509   PROTEINUR 30 (A) 03/22/2014 1509   UROBILINOGEN 0.2 03/22/2014 1509   NITRITE NEGATIVE 03/22/2014 1509   LEUKOCYTESUR SMALL (A) 03/22/2014 1509     Danyale Ridinger M.D. [facility] 06/19/2020, 1:40 PM  Available via Epic secure chat 7am-7pm After 7 pm, please refer to night coverage provider listed on amion.

## 2020-06-19 NOTE — Progress Notes (Signed)
No bipap needed at this time. Pt resting comfortably with stable vital signs.

## 2020-06-19 NOTE — Telephone Encounter (Signed)
Currently hospitalized for CHF. Cardiology consulted. Will have meds re-written at DC.

## 2020-06-20 DIAGNOSIS — I509 Heart failure, unspecified: Secondary | ICD-10-CM | POA: Diagnosis not present

## 2020-06-20 DIAGNOSIS — J9601 Acute respiratory failure with hypoxia: Secondary | ICD-10-CM

## 2020-06-20 DIAGNOSIS — I2510 Atherosclerotic heart disease of native coronary artery without angina pectoris: Secondary | ICD-10-CM | POA: Diagnosis not present

## 2020-06-20 DIAGNOSIS — I5021 Acute systolic (congestive) heart failure: Secondary | ICD-10-CM | POA: Diagnosis not present

## 2020-06-20 LAB — BASIC METABOLIC PANEL
Anion gap: 6 (ref 5–15)
BUN: 15 mg/dL (ref 8–23)
CO2: 34 mmol/L — ABNORMAL HIGH (ref 22–32)
Calcium: 9.5 mg/dL (ref 8.9–10.3)
Chloride: 95 mmol/L — ABNORMAL LOW (ref 98–111)
Creatinine, Ser: 1.03 mg/dL — ABNORMAL HIGH (ref 0.44–1.00)
GFR, Estimated: 51 mL/min — ABNORMAL LOW (ref >60)
Glucose, Bld: 176 mg/dL — ABNORMAL HIGH (ref 70–99)
Potassium: 3.1 mmol/L — ABNORMAL LOW (ref 3.5–5.1)
Sodium: 135 mmol/L (ref 135–145)

## 2020-06-20 LAB — GLUCOSE, CAPILLARY
Glucose-Capillary: 193 mg/dL — ABNORMAL HIGH (ref 70–99)
Glucose-Capillary: 213 mg/dL — ABNORMAL HIGH (ref 70–99)
Glucose-Capillary: 116 mg/dL — ABNORMAL HIGH (ref 70–99)
Glucose-Capillary: 243 mg/dL — ABNORMAL HIGH (ref 70–99)

## 2020-06-20 MED ORDER — POTASSIUM CHLORIDE CRYS ER 20 MEQ PO TBCR
40.0000 meq | EXTENDED_RELEASE_TABLET | Freq: Once | ORAL | Status: AC
  Administered 2020-06-20: 40 meq via ORAL
  Filled 2020-06-20: qty 2

## 2020-06-20 MED ORDER — INSULIN GLARGINE 100 UNIT/ML ~~LOC~~ SOLN
5.0000 [IU] | Freq: Every day | SUBCUTANEOUS | Status: DC
  Administered 2020-06-20: 5 [IU] via SUBCUTANEOUS
  Filled 2020-06-20 (×2): qty 0.05

## 2020-06-20 NOTE — Progress Notes (Signed)
Progress Note  Patient Name: Marissa Jefferson Date of Encounter: 06/20/2020  Primary Cardiologist: Sanda Klein, MD   Subjective   No events overnight.  Breathing is improved but not back to normal.  Notes that she was able to work with PT today, thought she felt that was when her PT at home found in significant hypoxia.  No CP.  Inpatient Medications    Scheduled Meds: . aspirin EC  81 mg Oral Daily  . atorvastatin  80 mg Oral QPM  . carvedilol  25 mg Oral BID WC  . cholecalciferol  5,000 Units Oral Daily  . darifenacin  15 mg Oral Daily  . enoxaparin (LOVENOX) injection  40 mg Subcutaneous Q24H  . furosemide  20 mg Intravenous Daily  . insulin aspart  0-15 Units Subcutaneous TID WC  . levothyroxine  150 mcg Oral Daily  . magnesium oxide  400 mg Oral BID  . sodium chloride flush  3 mL Intravenous Q12H   Continuous Infusions: . sodium chloride     PRN Meds: sodium chloride, acetaminophen, diclofenac Sodium, docusate sodium, hydrALAZINE, lip balm, ondansetron (ZOFRAN) IV, sodium chloride flush   Vital Signs    Vitals:   06/19/20 2028 06/19/20 2342 06/20/20 0155 06/20/20 0650  BP: 131/69 114/61  126/69  Pulse: 76 70  72  Resp: 16 18  17   Temp: 98.5 F (36.9 C) 98.1 F (36.7 C)  97.9 F (36.6 C)  TempSrc: Oral Oral  Oral  SpO2: 90% (!) 87%  (!) 88%  Weight:   61.3 kg   Height:        Intake/Output Summary (Last 24 hours) at 06/20/2020 1003 Last data filed at 06/20/2020 0803 Gross per 24 hour  Intake 838 ml  Output 1000 ml  Net -162 ml   Filed Weights   06/18/20 0435 06/19/20 0217 06/20/20 0155  Weight: 69.2 kg 62.6 kg 61.3 kg    Telemetry    SR - Personally Reviewed  ECG    No new last 24 - Personally Reviewed  Physical Exam   GEN: No acute distress.   Neck: JVD to third of neck Cardiac: RRR, no rubs, or gallops; holosystolic murmur heard Respiratory: Bilateral crackles in the bases GI: Soft, nontender, non-distended but with dullness to  percussion MS: No edema; No deformity. Neuro:  Nonfocal  Psych: Normal affect   Labs    Chemistry Recent Labs  Lab 06/18/20 0250 06/19/20 0155 06/20/20 0330  NA 136 138 135  K 4.1 4.0 3.1*  CL 99 96* 95*  CO2 26 37* 34*  GLUCOSE 124* 126* 176*  BUN 17 16 15   CREATININE 0.78 1.04* 1.03*  CALCIUM 9.6 10.0 9.5  GFRNONAA >60 50* 51*  ANIONGAP 11 5 6      Hematology Recent Labs  Lab 06/17/20 1126  WBC 9.3  RBC 5.12*  HGB 17.4*  HCT 53.4*  MCV 104.3*  MCH 34.0  MCHC 32.6  RDW 13.8  PLT 209    Cardiac EnzymesNo results for input(s): TROPONINI in the last 168 hours. No results for input(s): TROPIPOC in the last 168 hours.   BNP Recent Labs  Lab 06/17/20 1127 06/19/20 0155  BNP 892.4* 700.5*     DDimer  Recent Labs  Lab 06/19/20 1049  DDIMER 0.87*     Radiology    CT ANGIO CHEST PE W OR WO CONTRAST  Result Date: 06/19/2020 CLINICAL DATA:  87 year old female with clinical concern for pulmonary embolism. EXAM: CT ANGIOGRAPHY CHEST WITH CONTRAST  TECHNIQUE: Multidetector CT imaging of the chest was performed using the standard protocol during bolus administration of intravenous contrast. Multiplanar CT image reconstructions and MIPs were obtained to evaluate the vascular anatomy. CONTRAST:  100 mL Omnipaque 350, intravenous COMPARISON:  01/20/2015 FINDINGS: Cardiovascular: Satisfactory opacification of the pulmonary arteries to the segmental level. No evidence of pulmonary embolism. Moderate global cardiomegaly. Severe coronary atherosclerotic calcifications. Scattered atherosclerotic calcifications of the thoracic aorta. Mediastinum/Nodes: No enlarged mediastinal, hilar, or axillary lymph nodes. Thyroid gland, trachea, and esophagus demonstrate no significant findings. Lungs/Pleura: Mild diffuse mosaic attenuation pattern, likely secondary to expiratory image acquisition. Small symmetric bilateral pleural effusions with associated bibasilar passive atelectasis. Similar  appearing 6 mm rounded calcific density in the basilar posterior right pleural space. No pneumothorax. Upper Abdomen: The visualized upper abdomen is within normal limits. Musculoskeletal: Well-opposed median sternotomy. No acute osseous findings. Review of the MIP images confirms the above findings. IMPRESSION: Vascular: 1. No evidence of pulmonary embolism. 2. Coronary and aortic atherosclerosis (ICD10-I70.0). 3. Moderate global cardiomegaly. Non-Vascular: 1. Small bilateral pleural effusions with associated bibasilar passive atelectasis. 2. Diffuse mosaic attenuation pattern of the pulmonary parenchyma, likely secondary to expiratory image acquisition. Ruthann Cancer, MD [facility] Electronically Signed   By: Ruthann Cancer MD   On: 06/19/2020 14:14    Cardiac Studies    1. Left ventricular ejection fraction, by estimation, is 55 to 60%. The  left ventricle has normal function. The left ventricle has no regional  wall motion abnormalities. There is moderate left ventricular hypertrophy.  Left ventricular diastolic  parameters are consistent with Grade II diastolic dysfunction  (pseudonormalization). Elevated left ventricular end-diastolic pressure.  There is the interventricular septum is flattened in diastole ('D' shaped  left ventricle), consistent with right  ventricular volume overload and the interventricular septum is flattened  in systole, consistent with right ventricular pressure overload.   2. Right ventricular systolic function is severely reduced. The right  ventricular size is mildly enlarged. There is severely elevated pulmonary  artery systolic pressure. The estimated right ventricular systolic  pressure is 34.7 mmHg.   3. Left atrial size was mildly dilated.   4. Large pleural effusion in the left lateral region.   5. The mitral valve is grossly normal. Mild mitral valve regurgitation.   6. Tricuspid valve regurgitation  is moderate.   7. The NCC and the LCC are fused. . The aortic valve is tricuspid. Aortic  valve regurgitation is not visualized. Mild aortic valve stenosis.   8. The inferior vena cava is dilated in size with <50% respiratory  variability, suggesting right atrial pressure of 15 mmHg.   Patient Profile     87 y.o. female with CAD s/p CV CABG, HTN with DM, HLD with DM, HFpEF with subacute RV Failure and PH, Mild AS who presented with hypoxic respiratory failure  Assessment & Plan     Heart Failure presevered Ejection Fraction- decompensated with acute on chronic hypoxic respiratory failure Pulmonary Hypertension (Unclear etiology) CAD s/p Prior CABG Hypertension with DM HLD with DM - NYHA class II, Stage C, hypervolemic, etiology from CAD suspected - asymptomatic from CAD; anatomy LIMA-LAD, SVG-OM1, SVG-OM2, SVG-PD -- 2015 - Diuretic regimen: Lasix 20 mg IV for now balancing CrCL and volume status - Discussed the importance of fluid restriction of < 2 L, salt restriction, and checking daily weights  - Replace electrolytes PRN and keep K>4 and Mg>2. - Coreg 25 mg PO BID - SGLT2i planned as outpatient with  stable creatinine clearance persistently above 30; agree  - continue ASA, Statin 80 mg - will likely send BNP 06/22/20 AM if not continuing to make progress      For questions or updates, please contact [place] Please consult [email or web address] for contact info under Cardiology/STEMI.      Signed, Werner Lean, MD  06/20/2020, 10:03 AM

## 2020-06-20 NOTE — Plan of Care (Signed)
  Problem: Activity: Goal: Capacity to carry out activities will improve Outcome: Progressing   Problem: Clinical Measurements: Goal: Respiratory complications will improve Outcome: Progressing   Problem: Activity: Goal: Risk for activity intolerance will decrease Outcome: Progressing   Problem: Elimination: Goal: Will not experience complications related to urinary retention Outcome: Progressing   

## 2020-06-20 NOTE — Progress Notes (Signed)
[facility]                                                                              Patient Demographics  Marissa Jefferson, is a 87 y.o. female, DOB - Aug 18, 1924, SEG:[id number]  Admit date - 06/17/2020   Admitting Physician Lequita Halt, MD  Outpatient Primary MD for the patient is Merrilee Seashore, MD  Outpatient specialists:   LOS - 3  days   Medical records reviewed and are as summarized below:    Chief Complaint  Patient presents with  . Shortness of Breath       Brief summary   Patient is a 87 year old female with history of chronic diastolic CHF, hypertension, hyperlipidemia, CAD s/p 3v CABG, hypothyroidism, DM presented with increasing shortness of breath for last 2 weeks. Patient ported symptoms started 2 weeks ago and gradually worsening, initially exertional.  Then developed constant shortness of breath in the last 2 days, orthopnea at night.  Patient also noted her shoes do not fit. In ED, patient was found to have O2 sats in 70s, improved to 80s on 5 L O2, failed BiPAP trial.  Improved Admitted for further work-up  Assessment & Plan    Principal Problem:   Acute respiratory failure with hypoxia (HCC) likely secondary to acute on chronic diastolic CHF, right heart failure -Presented with fluid overload, hypoxia, BNP elevated 892 -Initially placed on Lasix 40 mg IV every 12 hours, now decreased to 20 mg IV daily, fluid restriction -Negative balance of 5.2 L, O2 sats improving, 98% on 3 L O2 via [place] -O2 sats O2 sats 95% on 6 L via [place] -Creatinine stable, 1.0 -Cardiology following - 2DEcho: EF 55-60%, G2DD, right heart failure, large left pleural effusion, severe pulmonary hypertension -CT angiogram negative for PE   Active Problems: Left pleural effusion -Improved on the CT angiogram with diuresis  Hypokalemia -Replaced    Essential hypertension -BP stable, continue on Coreg, Lasix -Hydralazine IV as needed for elevated BP     Hypercholesterolemia -Continue Lipitor    Diabetes mellitus type 2, uncontrolled (HCC), not on long-term insulin with hypoglycemia -Hold Metformin -Hemoglobin A1c 8.2 -Will benefit from SGLT2 inhibitor at discharge -CBGs elevated, continue sliding scale insulin, added Lantus 5 units at bedtime    Coronary artery disease S/P CABG -2D echo with right heart failure, preserved EF 55 to 60% -Troponins mildly elevated due to demand ischemia from #1    Hypothyroidism -Continue Synthroid, TSH 2.1    Pressure injury of skin Stage I, medial sacrum, present on admission Wound care per nursing  Generalized debility -PT OT evaluation   Code Status: Full CODE STATUS DVT Prophylaxis:  enoxaparin (LOVENOX) injection 40 mg Start: 06/17/20 1604   Level of Care: Level of care: Telemetry Medical Family Communication: Discussed all imaging results, lab results, explained to the patient's daughter, Neila Gear on phone number [phone] on 4/10  Disposition Plan:     Status is: Inpatient  Remains inpatient appropriate because:Inpatient level of care appropriate due to severity of illness   Dispo: The patient is from: Home  Anticipated d/c is to: Home              Patient currently is not medically stable to d/c.   Difficult to place patient No      Time Spent in minutes   35 minutes  Procedures:  None  Consultants:    None  Antimicrobials:   Anti-infectives (From admission, onward)   None         Medications  Scheduled Meds: . aspirin EC  81 mg Oral Daily  . atorvastatin  80 mg Oral QPM  . carvedilol  25 mg Oral BID WC  . cholecalciferol  5,000 Units Oral Daily  . darifenacin  15 mg Oral Daily  . enoxaparin (LOVENOX) injection  40 mg Subcutaneous Q24H  . furosemide  20 mg Intravenous Daily  . insulin aspart  0-15 Units Subcutaneous TID WC  . levothyroxine  150 mcg Oral Daily  . magnesium oxide  400 mg Oral BID  . sodium chloride flush  3 mL  Intravenous Q12H   Continuous Infusions: . sodium chloride     PRN Meds:.sodium chloride, acetaminophen, diclofenac Sodium, docusate sodium, hydrALAZINE, lip balm, ondansetron (ZOFRAN) IV, sodium chloride flush      Subjective:   Yousra Caffrey was seen and examined today.  No acute complaints.  On O2 3 L via [place], overall improving.  No acute chest pain.  Breathing is improving.   Patient denies dizziness,  abdominal pain, N/V/D/C, new weakness.  No acute events overnight.  Objective:   Vitals:   06/19/20 2342 06/20/20 0155 06/20/20 0650 06/20/20 1123  BP: 114/61  126/69 116/66  Pulse: 70  72 (!) 58  Resp: 18  17 18   Temp: 98.1 F (36.7 C)  97.9 F (36.6 C) 98.8 F (37.1 C)  TempSrc: Oral  Oral Oral  SpO2: (!) 87%  (!) 88% 98%  Weight:  61.3 kg    Height:        Intake/Output Summary (Last 24 hours) at 06/20/2020 1232 Last data filed at 06/20/2020 1044 Gross per 24 hour  Intake 838 ml  Output 1350 ml  Net -512 ml     Wt Readings from Last 3 Encounters:  06/20/20 61.3 kg  02/11/20 64.9 kg  01/23/19 64.4 kg   Physical Exam  General: Alert and oriented x 3, NAD, pleasant  Cardiovascular: S1 S2 clear, RRR. holosystolic murmur  Respiratory: Bibasilar crackles  Gastrointestinal: Soft, nontender, nondistended, NBS  Ext: no pedal edema bilaterally  Neuro: no new deficits  Musculoskeletal: No cyanosis, clubbing  Skin: No rashes  Psych: Normal affect and demeanor, alert and oriented x3     Data Reviewed:  I have personally reviewed following labs and imaging studies  Micro Results Recent Results (from the past 240 hour(s))  Resp Panel by RT-PCR (Flu A&B, Covid) Nasopharyngeal Swab     Status: None   Collection Time: 06/17/20  2:20 PM   Specimen: Nasopharyngeal Swab; Nasopharyngeal(NP) swabs in vial transport medium  Result Value Ref Range Status   SARS Coronavirus 2 by RT PCR NEGATIVE NEGATIVE Final    Comment: (NOTE) SARS-CoV-2 target nucleic acids are  NOT DETECTED.  The SARS-CoV-2 RNA is generally detectable in upper respiratory specimens during the acute phase of infection. The lowest concentration of SARS-CoV-2 viral copies this assay can detect is 138 copies/mL. A negative result does not preclude SARS-Cov-2 infection and should not be used as the sole basis for treatment or other patient management decisions. A negative result may occur  with  improper specimen collection/handling, submission of specimen other than nasopharyngeal swab, presence of viral mutation(s) within the areas targeted by this assay, and inadequate number of viral copies(<138 copies/mL). A negative result must be combined with clinical observations, patient history, and epidemiological information. The expected result is Negative.  Fact Sheet for Patients:  EntrepreneurPulse.com.au  Fact Sheet for Healthcare Providers:  IncredibleEmployment.be  This test is no t yet approved or cleared by the [place] FDA and  has been authorized for detection and/or diagnosis of SARS-CoV-2 by FDA under an Emergency Use Authorization (EUA). This EUA will remain  in effect (meaning this test can be used) for the duration of the COVID-19 declaration under Section 564(b)(1) of the Act, 21 U.S.C.section 360bbb-3(b)(1), unless the authorization is terminated  or revoked sooner.       Influenza A by PCR NEGATIVE NEGATIVE Final   Influenza B by PCR NEGATIVE NEGATIVE Final    Comment: (NOTE) The Xpert Xpress SARS-CoV-2/FLU/RSV plus assay is intended as an aid in the diagnosis of influenza from Nasopharyngeal swab specimens and should not be used as a sole basis for treatment. Nasal washings and aspirates are unacceptable for Xpert Xpress SARS-CoV-2/FLU/RSV testing.  Fact Sheet for Patients: EntrepreneurPulse.com.au  Fact Sheet for Healthcare Providers: IncredibleEmployment.be  This test is not  yet approved or cleared by the [place] FDA and has been authorized for detection and/or diagnosis of SARS-CoV-2 by FDA under an Emergency Use Authorization (EUA). This EUA will remain in effect (meaning this test can be used) for the duration of the COVID-19 declaration under Section 564(b)(1) of the Act, 21 U.S.C. section 360bbb-3(b)(1), unless the authorization is terminated or revoked.  Performed at [facility], [place] [address], [place], [place] [address]     Radiology Reports CT ANGIO CHEST PE W OR WO CONTRAST  Result Date: 06/19/2020 CLINICAL DATA:  87 year old female with clinical concern for pulmonary embolism. EXAM: CT ANGIOGRAPHY CHEST WITH CONTRAST TECHNIQUE: Multidetector CT imaging of the chest was performed using the standard protocol during bolus administration of intravenous contrast. Multiplanar CT image reconstructions and MIPs were obtained to evaluate the vascular anatomy. CONTRAST:  100 mL Omnipaque 350, intravenous COMPARISON:  01/20/2015 FINDINGS: Cardiovascular: Satisfactory opacification of the pulmonary arteries to the segmental level. No evidence of pulmonary embolism. Moderate global cardiomegaly. Severe coronary atherosclerotic calcifications. Scattered atherosclerotic calcifications of the thoracic aorta. Mediastinum/Nodes: No enlarged mediastinal, hilar, or axillary lymph nodes. Thyroid gland, trachea, and esophagus demonstrate no significant findings. Lungs/Pleura: Mild diffuse mosaic attenuation pattern, likely secondary to expiratory image acquisition. Small symmetric bilateral pleural effusions with associated bibasilar passive atelectasis. Similar appearing 6 mm rounded calcific density in the basilar posterior right pleural space. No pneumothorax. Upper Abdomen: The visualized upper abdomen is within normal limits. Musculoskeletal: Well-opposed median sternotomy. No acute osseous findings. Review of the MIP images confirms the above findings.  IMPRESSION: Vascular: 1. No evidence of pulmonary embolism. 2. Coronary and aortic atherosclerosis (ICD10-I70.0). 3. Moderate global cardiomegaly. Non-Vascular: 1. Small bilateral pleural effusions with associated bibasilar passive atelectasis. 2. Diffuse mosaic attenuation pattern of the pulmonary parenchyma, likely secondary to expiratory image acquisition. Ruthann Cancer, MD [facility] Radiology Electronically Signed   By: Ruthann Cancer MD   On: 06/19/2020 14:14   DG Chest Port 1 View  Result Date: 06/18/2020 CLINICAL DATA:  Shortness of breath EXAM: PORTABLE CHEST 1 VIEW COMPARISON:  June 17, 2020. FINDINGS: There is a persistent small left pleural effusion with consolidation in  the left lower lung region. Small right pleural effusion noted. Focal airspace opacity lateral right base again noted without change. No new opacity evident. There is cardiomegaly with pulmonary vascularity normal. There is aortic atherosclerosis. Patient is status post coronary artery bypass grafting. No appreciable adenopathy by portable radiography. Surgical clips noted in left axilla. IMPRESSION: Stable cardiomegaly. Small pleural effusions bilaterally. Consolidation consistent with atelectasis and potential superimposed pneumonia left lower lobe. Suspect small focus of infiltrate lateral right base. Appearance of the lungs is stable compared to 1 day prior. Status post coronary artery bypass grafting. Aortic Atherosclerosis (ICD10-I70.0). Electronically Signed   By: Lowella Grip III M.D.   On: 06/18/2020 09:35   DG Chest Portable 1 View  Result Date: 06/17/2020 CLINICAL DATA:  Short of breath, hypoxia, history of CHF EXAM: PORTABLE CHEST 1 VIEW COMPARISON:  Prior chest x-ray 03/29/2015 FINDINGS: Enlarged cardiopericardial silhouette consistent with cardiomegaly. Atherosclerotic calcifications visualized in the transverse aorta. Patient is status post median sternotomy for  multivessel CABG. Bibasilar airspace opacities are present with blunting of the costophrenic angles consistent with a combination of layering pleural fluid with atelectasis and/or infiltrate. Mild vascular congestion without overt edema. Chronic bronchitic changes and interstitial prominence are similar compared to relatively remote prior imaging. No pneumothorax. Surgical clips again seen in the left axilla. No acute osseous abnormality. IMPRESSION: 1. Cardiomegaly and pulmonary vascular congestion bordering on mild edema. 2. Bilateral moderate layering pleural effusions with associated bibasilar atelectasis and/or infiltrate. 3. Aortic atherosclerosis. Electronically Signed   By: Jacqulynn Cadet M.D.   On: 06/17/2020 12:41   ECHOCARDIOGRAM COMPLETE  Result Date: 06/18/2020    ECHOCARDIOGRAM REPORT   Patient Name:   ELLISSA AYO Date of Exam: 06/18/2020 Medical Rec #:  [id number]  Height:       66.0 in Accession #:    [id number] Weight:       152.6 lb Date of Birth:  22-Jan-1925  BSA:          1.782 m Patient Age:    35 years   BP:           122/75 mmHg Patient Gender: F          HR:           85 bpm. Exam Location:  Inpatient Procedure: 2D Echo, Cardiac Doppler and Color Doppler Indications:    CHF  History:        Patient has prior history of Echocardiogram examinations, most                 recent 03/22/2014. CHF; Prior CABG. CABG 10/09/2013.  Sonographer:    [place] Referring Phys: [id number] [place]  1. Left ventricular ejection fraction, by estimation, is 55 to 60%. The left ventricle has normal function. The left ventricle has no regional wall motion abnormalities. There is moderate left ventricular hypertrophy. Left ventricular diastolic parameters are consistent with Grade II diastolic dysfunction (pseudonormalization). Elevated left ventricular end-diastolic pressure. There is the interventricular septum is flattened in diastole ('D' shaped left ventricle), consistent with right  ventricular volume overload and the interventricular septum is flattened in systole, consistent with right ventricular pressure overload.  2. Right ventricular systolic function is severely reduced. The right ventricular size is mildly enlarged. There is severely elevated pulmonary artery systolic pressure. The estimated right ventricular systolic pressure is 30.8 mmHg.  3. Left atrial size was mildly dilated.  4. Large pleural effusion in the left lateral region.  5. The mitral  valve is grossly normal. Mild mitral valve regurgitation.  6. Tricuspid valve regurgitation is moderate.  7. The NCC and the LCC are fused. . The aortic valve is tricuspid. Aortic valve regurgitation is not visualized. Mild aortic valve stenosis.  8. The inferior vena cava is dilated in size with <50% respiratory variability, suggesting right atrial pressure of 15 mmHg. FINDINGS  Left Ventricle: Left ventricular ejection fraction, by estimation, is 55 to 60%. The left ventricle has normal function. The left ventricle has no regional wall motion abnormalities. The left ventricular internal cavity size was normal in size. There is  moderate left ventricular hypertrophy. The interventricular septum is flattened in diastole ('D' shaped left ventricle), consistent with right ventricular volume overload and the interventricular septum is flattened in systole, consistent with right ventricular pressure overload. Left ventricular diastolic parameters are consistent with Grade II diastolic dysfunction (pseudonormalization). Elevated left ventricular end-diastolic pressure. Right Ventricle: The right ventricular size is mildly enlarged. Right vetricular wall thickness was not well visualized. Right ventricular systolic function is severely reduced. There is severely elevated pulmonary artery systolic pressure. The tricuspid  regurgitant velocity is 3.47 m/s, and with an assumed right atrial pressure of 15 mmHg, the estimated right ventricular systolic  pressure is 84.1 mmHg. Left Atrium: Left atrial size was mildly dilated. Right Atrium: Right atrial size was normal in size. Pericardium: There is no evidence of pericardial effusion. Mitral Valve: The mitral valve is grossly normal. There is mild thickening of the mitral valve leaflet(s). There is mild calcification of the mitral valve leaflet(s). Mild mitral annular calcification. Mild mitral valve regurgitation. Tricuspid Valve: The tricuspid valve is normal in structure. Tricuspid valve regurgitation is moderate. Aortic Valve: The [place] and the [place] are fused. The aortic valve is tricuspid. Aortic valve regurgitation is not visualized. Mild aortic stenosis is present. Aortic valve mean gradient measures 7.0 mmHg. Aortic valve peak gradient measures 10.4 mmHg. Aortic  valve area, by VTI measures 1.40 cm. Pulmonic Valve: The pulmonic valve was normal in structure. Pulmonic valve regurgitation is mild. Aorta: The aortic root and ascending aorta are structurally normal, with no evidence of dilitation. Venous: The inferior vena cava is dilated in size with less than 50% respiratory variability, suggesting right atrial pressure of 15 mmHg. IAS/Shunts: No atrial level shunt detected by color flow Doppler. Additional Comments: There is a large pleural effusion in the left lateral region.  LEFT VENTRICLE PLAX 2D LVIDd:         3.60 cm     Diastology LVIDs:         2.60 cm     LV e' medial:    2.81 cm/s LV PW:         1.25 cm     LV E/e' medial:  27.7 LV IVS:        1.50 cm     LV e' lateral:   2.69 cm/s LVOT diam:     2.00 cm     LV E/e' lateral: 29.0 LV SV:         50 LV SV Index:   28 LVOT Area:     3.14 cm  LV Volumes (MOD) LV vol d, MOD A2C: 59.8 ml LV vol d, MOD A4C: 46.4 ml LV vol s, MOD A4C: 21.5 ml LV SV MOD A4C:     46.4 ml RIGHT VENTRICLE RV S prime:     5.93 cm/s  PULMONARY VEINS TAPSE (M-mode): 0.7 cm     A Reversal Duration: 53.00 msec  A Reversal Velocity: 26.10 cm/s                             Diastolic Velocity:  61.60 cm/s                            S/D Velocity:        1.50                            Systolic Velocity:   73.71 cm/s LEFT ATRIUM             Index       RIGHT ATRIUM           Index LA diam:        3.80 cm 2.13 cm/m  RA Area:     17.30 cm LA Vol (A2C):   51.7 ml 29.01 ml/m RA Volume:   50.00 ml  28.05 ml/m LA Vol (A4C):   62.5 ml 35.06 ml/m LA Biplane Vol: 62.4 ml 35.01 ml/m  AORTIC VALVE                    PULMONIC VALVE AV Area (Vmax):    1.60 cm     PV Vmax:       0.86 m/s AV Area (Vmean):   1.30 cm     PV Vmean:      54.600 cm/s AV Area (VTI):     1.40 cm     PV VTI:        0.132 m AV Vmax:           161.00 cm/s  PV Peak grad:  2.9 mmHg AV Vmean:          131.000 cm/s PV Mean grad:  1.0 mmHg AV VTI:            0.354 m AV Peak Grad:      10.4 mmHg AV Mean Grad:      7.0 mmHg LVOT Vmax:         82.20 cm/s LVOT Vmean:        54.400 cm/s LVOT VTI:          0.158 m LVOT/AV VTI ratio: 0.45  AORTA Ao Root diam: 3.50 cm Ao Asc diam:  3.50 cm MITRAL VALVE               TRICUSPID VALVE MV Area (PHT): 2.99 cm    TR Peak grad:   48.2 mmHg MV Decel Time: 254 msec    TR Vmax:        347.00 cm/s MV E velocity: 77.90 cm/s MV A velocity: 90.90 cm/s  SHUNTS MV E/A ratio:  0.86        Systemic VTI:  0.16 m                            Systemic Diam: 2.00 cm Mertie Moores MD Electronically signed by Mertie Moores MD Signature Date/Time: 06/18/2020/11:40:52 AM    Final     Lab Data:  CBC: Recent Labs  Lab 06/17/20 1126  WBC 9.3  NEUTROABS 7.7  HGB 17.4*  HCT 53.4*  MCV 104.3*  PLT 062   Basic Metabolic Panel: Recent Labs  Lab 06/17/20 1126 06/18/20 0250 06/19/20 0155 06/20/20 0330  NA 133* 136 138 135  K 4.7 4.1 4.0 3.1*  CL 98 99 96* 95*  CO2 26 26 37* 34*  GLUCOSE 267* 124* 126* 176*  BUN 21 17 16 15   CREATININE 0.92 0.78 1.04* 1.03*  CALCIUM 10.1 9.6 10.0 9.5   GFR: Estimated Creatinine Clearance: 31.9 mL/min (A) (by C-G formula based on SCr of 1.03  mg/dL (H)). Liver Function Tests: No results for input(s): AST, ALT, ALKPHOS, BILITOT, PROT, ALBUMIN in the last 168 hours. No results for input(s): LIPASE, AMYLASE in the last 168 hours. No results for input(s): AMMONIA in the last 168 hours. Coagulation Profile: No results for input(s): INR, PROTIME in the last 168 hours. Cardiac Enzymes: No results for input(s): CKTOTAL, CKMB, CKMBINDEX, TROPONINI in the last 168 hours. BNP (last 3 results) No results for input(s): PROBNP in the last 8760 hours. HbA1C: Recent Labs    06/17/20 1905  HGBA1C 8.2*   CBG: Recent Labs  Lab 06/19/20 1205 06/19/20 1517 06/19/20 2142 06/20/20 0610 06/20/20 1124  GLUCAP 192* 228* 253* 193* 213*   Lipid Profile: No results for input(s): CHOL, HDL, LDLCALC, TRIG, CHOLHDL, LDLDIRECT in the last 72 hours. Thyroid Function Tests: Recent Labs    06/17/20 1905  TSH 2.175   Anemia Panel: No results for input(s): VITAMINB12, FOLATE, FERRITIN, TIBC, IRON, RETICCTPCT in the last 72 hours. Urine analysis:    Component Value Date/Time   COLORURINE AMBER (A) 03/22/2014 1509   APPEARANCEUR CLOUDY (A) 03/22/2014 1509   LABSPEC 1.024 03/22/2014 1509   PHURINE 5.0 03/22/2014 1509   GLUCOSEU NEGATIVE 03/22/2014 1509   HGBUR NEGATIVE 03/22/2014 1509   BILIRUBINUR SMALL (A) 03/22/2014 1509   KETONESUR 15 (A) 03/22/2014 1509   PROTEINUR 30 (A) 03/22/2014 1509   UROBILINOGEN 0.2 03/22/2014 1509   NITRITE NEGATIVE 03/22/2014 1509   LEUKOCYTESUR SMALL (A) 03/22/2014 1509     Alyia Lacerte M.D. [facility] 06/20/2020, 12:32 PM  Available via Epic secure chat 7am-7pm After 7 pm, please refer to night coverage provider listed on amion.

## 2020-06-20 NOTE — Progress Notes (Signed)
Physical Therapy Treatment Patient Details Name: Marissa Jefferson MRN: [id number] DOB: 1924-07-15 Today's Date: 06/20/2020    History of Present Illness Pt is a 87 y.o. female admitted to ED from [place] office on 06/17/2020 with reports of SOB, hypoxia, and BLE edema. CXR with pulmonary edema. Workup for decompensated HF with acute on chronic hypoxic respiratory failure. PMH includes chronic diastolic CHF, HTN, HLD, CAD (s/p CABG), hypothyroidism, IIDM.   PT Comments    Pt progressing with mobility; able to tolerate increased activity and short ambulation distance this session with RW and min guard. Pt endorses feeling fatigued and weak, although somewhat improved since prior PT session. SpO2 85% on 3L, up to 90% on 4L with activity. Pt notes having 20 steps to enter home, but unsure she will want to practice stairs with PT this admission; pt reports, "My son can carry me up them if he needs to." Will continue to follow acutely.   Follow Up Recommendations  Home health PT;Supervision - Intermittent     Equipment Recommendations  None recommended by PT    Recommendations for Other Services       Precautions / Restrictions Precautions Precautions: Fall;Other (comment) Precaution Comments: Watch SpO2 Restrictions Weight Bearing Restrictions: No    Mobility  Bed Mobility Overal bed mobility: Modified Independent Bed Mobility: Supine to Sit     Supine to sit: HOB elevated          Transfers Overall transfer level: Needs assistance Equipment used: Rolling walker (2 wheeled) Transfers: Sit to/from Stand Sit to Stand: Min guard         General transfer comment: Multiple sit<>stands from EOB and recliner to RW, cues for hand placement; pt reliant on rocking and counts to '3' prior to standing; min guard for balance, no physical assist required; increased time and effort  Ambulation/Gait Ambulation/Gait assistance: Min guard Gait Distance (Feet): 18 Feet Assistive device:  Rolling walker (2 wheeled) Gait Pattern/deviations: Step-through pattern;Decreased stride length;Trunk flexed Gait velocity: Decreased   General Gait Details: Initial steps to recliner with RW, then additional ambulation distance with RW and min guard; pt declined further distance secondary to fatigue; DOE 3/4, SpO2 90% on 4L O2 [place]   Stairs             Wheelchair Mobility    Modified Rankin (Stroke Patients Only)       Balance Overall balance assessment: Needs assistance Sitting-balance support: No upper extremity supported;Feet supported Sitting balance-Leahy Scale: Good     Standing balance support: Single extremity supported;Bilateral upper extremity supported Standing balance-Leahy Scale: Poor Standing balance comment: reliant on UE support of RW                            Cognition Arousal/Alertness: Awake/alert Behavior During Therapy: WFL for tasks assessed/performed Overall Cognitive Status: No family/caregiver present to determine baseline cognitive functioning Area of Impairment: Memory;Problem solving                     Memory: Decreased short-term memory       Problem Solving: Requires verbal cues General Comments: Cognition WFL for majority of simple tasks; some repetition in stories told during session. Suspect pt near baseline cognition      Exercises Other Exercises Other Exercises: Ankle pumps, LAQ; 3x repeated sit<>stand (heavy reliance on UE support to push to stand)    General Comments General comments (skin integrity, edema, etc.): SpO2 85% on 3L O2  [place] upon entry to room, pt resting in bed; increased to 4L O2 [place] for activity and SpO2 maintaining >/90%; HR 60s-70s. Discussed the fact that pt does not want to walk far due to fatigue and legs feeling weak, but she needs to be able to ascend 20 steps into apartment; pt reports she is unsure if she will want to practice stairs during admission, her son can carry her if needed       Pertinent Vitals/Pain Pain Assessment: No/denies pain    Home Living                      Prior Function            PT Goals (current goals can now be found in the care plan section) Progress towards PT goals: Progressing toward goals    Frequency    Min 3X/week      PT Plan Current plan remains appropriate    Co-evaluation              AM-PAC PT "6 Clicks" Mobility   Outcome Measure  Help needed turning from your back to your side while in a flat bed without using bedrails?: None Help needed moving from lying on your back to sitting on the side of a flat bed without using bedrails?: A Little Help needed moving to and from a bed to a chair (including a wheelchair)?: A Little Help needed standing up from a chair using your arms (e.g., wheelchair or bedside chair)?: A Little Help needed to walk in hospital room?: A Little Help needed climbing 3-5 steps with a railing? : A Lot 6 Click Score: 18    End of Session Equipment Utilized During Treatment: Gait belt;Oxygen Activity Tolerance: Patient tolerated treatment well;Patient limited by fatigue Patient left: in chair;with call bell/phone within reach Nurse Communication: Mobility status PT Visit Diagnosis: Other abnormalities of gait and mobility (R26.89);Muscle weakness (generalized) (M62.81)     Time: 3818-2993 PT Time Calculation (min) (ACUTE ONLY): 30 min  Charges:  $Therapeutic Exercise: 8-22 mins $Therapeutic Activity: 8-22 mins                     Mabeline Caras, PT, DPT Acute Rehabilitation Services  Pager [phone] Office [facility] 06/20/2020, 11:59 AM

## 2020-06-21 DIAGNOSIS — J9601 Acute respiratory failure with hypoxia: Secondary | ICD-10-CM | POA: Diagnosis not present

## 2020-06-21 DIAGNOSIS — I509 Heart failure, unspecified: Secondary | ICD-10-CM | POA: Diagnosis not present

## 2020-06-21 DIAGNOSIS — I2510 Atherosclerotic heart disease of native coronary artery without angina pectoris: Secondary | ICD-10-CM | POA: Diagnosis not present

## 2020-06-21 DIAGNOSIS — I5021 Acute systolic (congestive) heart failure: Secondary | ICD-10-CM | POA: Diagnosis not present

## 2020-06-21 LAB — BASIC METABOLIC PANEL
Anion gap: 10 (ref 5–15)
BUN: 19 mg/dL (ref 8–23)
CO2: 31 mmol/L (ref 22–32)
Calcium: 9.8 mg/dL (ref 8.9–10.3)
Chloride: 98 mmol/L (ref 98–111)
Creatinine, Ser: 0.92 mg/dL (ref 0.44–1.00)
GFR, Estimated: 58 mL/min — ABNORMAL LOW (ref >60)
Glucose, Bld: 152 mg/dL — ABNORMAL HIGH (ref 70–99)
Potassium: 4 mmol/L (ref 3.5–5.1)
Sodium: 139 mmol/L (ref 135–145)

## 2020-06-21 LAB — GLUCOSE, CAPILLARY
Glucose-Capillary: 156 mg/dL — ABNORMAL HIGH (ref 70–99)
Glucose-Capillary: 200 mg/dL — ABNORMAL HIGH (ref 70–99)
Glucose-Capillary: 191 mg/dL — ABNORMAL HIGH (ref 70–99)
Glucose-Capillary: 128 mg/dL — ABNORMAL HIGH (ref 70–99)

## 2020-06-21 MED ORDER — INSULIN GLARGINE 100 UNIT/ML ~~LOC~~ SOLN
8.0000 [IU] | Freq: Every day | SUBCUTANEOUS | Status: DC
  Administered 2020-06-21 – 2020-06-26 (×6): 8 [IU] via SUBCUTANEOUS
  Filled 2020-06-21 (×8): qty 0.08

## 2020-06-21 MED ORDER — FUROSEMIDE 10 MG/ML IJ SOLN
40.0000 mg | Freq: Every day | INTRAMUSCULAR | Status: DC
  Administered 2020-06-22: 40 mg via INTRAVENOUS
  Filled 2020-06-21: qty 4

## 2020-06-21 NOTE — Progress Notes (Signed)
Pt. States she's in no pain at this time. Takes Tylenol every night for arthritis.

## 2020-06-21 NOTE — Progress Notes (Addendum)
[facility]                                                                              Patient Demographics  Georgeann Brinkman, is a 87 y.o. female, DOB - 1924/08/15, JIR:[id number]  Admit date - 06/17/2020   Admitting Physician Lequita Halt, MD  Outpatient Primary MD for the patient is Merrilee Seashore, MD  Outpatient specialists:   LOS - 4  days   Medical records reviewed and are as summarized below:    Chief Complaint  Patient presents with  . Shortness of Breath       Brief summary   Patient is a 87 year old female with history of chronic diastolic CHF, hypertension, hyperlipidemia, CAD s/p 3v CABG, hypothyroidism, DM presented with increasing shortness of breath for last 2 weeks. Patient ported symptoms started 2 weeks ago and gradually worsening, initially exertional.  Then developed constant shortness of breath in the last 2 days, orthopnea at night.  Patient also noted her shoes do not fit. In ED, patient was found to have O2 sats in 70s, improved to 80s on 5 L O2, failed BiPAP trial.  Improved Admitted for further work-up for acute respiratory failure, hypoxia, acute on chronic diastolic CHF  Assessment & Plan    Principal Problem:   Acute respiratory failure with hypoxia (HCC) likely secondary to acute on chronic diastolic CHF, right heart failure -Presented with fluid overload, hypoxia, BNP elevated 892 - 2DEcho: EF 55-60%, G2DD, right heart failure, large left pleural effusion, severe pulmonary hypertension -CT angiogram negative for PE -Cardiology following, home O2 evaluation done, still requiring 4 L O2 -Lasix increased to 40 mg IV daily, monitor renal function closely -Negative balance of 5.4 L, weight down from 143.2 lbs on admission to 136 today  Active Problems: Left pleural effusion -Improved on the CT angiogram with diuresis  Hypokalemia -Replaced    Essential hypertension -BP stable, continue Coreg, Lasix     Hypercholesterolemia -Continue Lipitor    Diabetes mellitus type 2, uncontrolled ([place]), not on long-term insulin with hypoglycemia -Hold Metformin -Hemoglobin A1c 8.2 -Will benefit from SGLT2 inhibitor at discharge -CBGs elevated, increase Lantus to 8 units at bedtime, continue sliding scale insulin - change to carb mod diet    Coronary artery disease S/P CABG -2D echo with right heart failure, preserved EF 55 to 60% -Troponins mildly elevated due to demand ischemia in the setting of acute decompensated diastolic CHF    Hypothyroidism -Continue Synthroid, TSH 2.1    Pressure injury of skin Stage I, medial sacrum, present on admission Wound care per nursing  Generalized debility -PT evaluation recommended home health PT   Code Status: Full CODE STATUS DVT Prophylaxis:  enoxaparin (LOVENOX) injection 40 mg Start: 06/17/20 1604   Level of Care: Level of care: Telemetry Medical Family Communication: Discussed all imaging results, lab results, explained to the patient's daughter, Neila Gear on phone number [phone] today  Disposition Plan:     Status is: Inpatient  Remains inpatient appropriate because:Inpatient level of care appropriate due to severity of illness   Dispo: The patient is from: Home  Anticipated d/c is to: Home              Patient currently is not medically stable to d/c. Still hypoxia on 4L   Difficult to place patient No      Time Spent in minutes   25 minutes  Procedures:  None  Consultants:   Cardiology  Antimicrobials:   Anti-infectives (From admission, onward)   None         Medications  Scheduled Meds: . aspirin EC  81 mg Oral Daily  . atorvastatin  80 mg Oral QPM  . carvedilol  25 mg Oral BID WC  . cholecalciferol  5,000 Units Oral Daily  . darifenacin  15 mg Oral Daily  . enoxaparin (LOVENOX) injection  40 mg Subcutaneous Q24H  . [START ON 06/22/2020] furosemide  40 mg Intravenous Daily  . insulin aspart   0-15 Units Subcutaneous TID WC  . insulin glargine  5 Units Subcutaneous QHS  . levothyroxine  150 mcg Oral Daily  . magnesium oxide  400 mg Oral BID  . sodium chloride flush  3 mL Intravenous Q12H   Continuous Infusions: . sodium chloride     PRN Meds:.sodium chloride, acetaminophen, diclofenac Sodium, docusate sodium, hydrALAZINE, lip balm, ondansetron (ZOFRAN) IV, sodium chloride flush      Subjective:   Rebekka Terrien was seen and examined today.  Still hypoxia, needing on 4L O2.  Breathing is getting better. No CP.   Patient denies dizziness,  abdominal pain, N/V/D/C, new weakness.  No acute events overnight.  Objective:   Vitals:   06/20/20 1946 06/21/20 0111 06/21/20 0405 06/21/20 0806  BP: 117/63  (!) 150/78 122/77  Pulse: 62  60 62  Resp: 18  18   Temp: 98 F (36.7 C)  98.5 F (36.9 C) 98.6 F (37 C)  TempSrc: Oral  Oral Oral  SpO2: 90%  95% 92%  Weight:  61.7 kg    Height:        Intake/Output Summary (Last 24 hours) at 06/21/2020 1446 Last data filed at 06/21/2020 0813 Gross per 24 hour  Intake 487 ml  Output 850 ml  Net -363 ml     Wt Readings from Last 3 Encounters:  06/21/20 61.7 kg  02/11/20 64.9 kg  01/23/19 64.4 kg   Physical Exam  General: Alert and oriented x 3, NAD  Cardiovascular: S1 S2 clear, RRR. Holosystolic murmur  Respiratory: bibasilar crackles  Gastrointestinal: Soft, nontender, nondistended, NBS  Ext: no pedal edema bilaterally  Neuro: no new deficits  Musculoskeletal: No cyanosis, clubbing  Skin: No rashes  Psych: Normal affect and demeanor, alert and oriented x3     Data Reviewed:  I have personally reviewed following labs and imaging studies  Micro Results Recent Results (from the past 240 hour(s))  Resp Panel by RT-PCR (Flu A&B, Covid) Nasopharyngeal Swab     Status: None   Collection Time: 06/17/20  2:20 PM   Specimen: Nasopharyngeal Swab; Nasopharyngeal(NP) swabs in vial transport medium  Result Value Ref  Range Status   SARS Coronavirus 2 by RT PCR NEGATIVE NEGATIVE Final    Comment: (NOTE) SARS-CoV-2 target nucleic acids are NOT DETECTED.  The SARS-CoV-2 RNA is generally detectable in upper respiratory specimens during the acute phase of infection. The lowest concentration of SARS-CoV-2 viral copies this assay can detect is 138 copies/mL. A negative result does not preclude SARS-Cov-2 infection and should not be used as the sole basis for treatment or other patient management decisions. A  negative result may occur with  improper specimen collection/handling, submission of specimen other than nasopharyngeal swab, presence of viral mutation(s) within the areas targeted by this assay, and inadequate number of viral copies(<138 copies/mL). A negative result must be combined with clinical observations, patient history, and epidemiological information. The expected result is Negative.  Fact Sheet for Patients:  EntrepreneurPulse.com.au  Fact Sheet for Healthcare Providers:  IncredibleEmployment.be  This test is no t yet approved or cleared by the [place] FDA and  has been authorized for detection and/or diagnosis of SARS-CoV-2 by FDA under an Emergency Use Authorization (EUA). This EUA will remain  in effect (meaning this test can be used) for the duration of the COVID-19 declaration under Section 564(b)(1) of the Act, 21 U.S.C.section 360bbb-3(b)(1), unless the authorization is terminated  or revoked sooner.       Influenza A by PCR NEGATIVE NEGATIVE Final   Influenza B by PCR NEGATIVE NEGATIVE Final    Comment: (NOTE) The Xpert Xpress SARS-CoV-2/FLU/RSV plus assay is intended as an aid in the diagnosis of influenza from Nasopharyngeal swab specimens and should not be used as a sole basis for treatment. Nasal washings and aspirates are unacceptable for Xpert Xpress SARS-CoV-2/FLU/RSV testing.  Fact Sheet for  Patients: EntrepreneurPulse.com.au  Fact Sheet for Healthcare Providers: IncredibleEmployment.be  This test is not yet approved or cleared by the [place] FDA and has been authorized for detection and/or diagnosis of SARS-CoV-2 by FDA under an Emergency Use Authorization (EUA). This EUA will remain in effect (meaning this test can be used) for the duration of the COVID-19 declaration under Section 564(b)(1) of the Act, 21 U.S.C. section 360bbb-3(b)(1), unless the authorization is terminated or revoked.  Performed at [facility], [place] [address] [place], [place] [address]     Radiology Reports CT ANGIO CHEST PE W OR WO CONTRAST  Result Date: 06/19/2020 CLINICAL DATA:  87 year old female with clinical concern for pulmonary embolism. EXAM: CT ANGIOGRAPHY CHEST WITH CONTRAST TECHNIQUE: Multidetector CT imaging of the chest was performed using the standard protocol during bolus administration of intravenous contrast. Multiplanar CT image reconstructions and MIPs were obtained to evaluate the vascular anatomy. CONTRAST:  100 mL Omnipaque 350, intravenous COMPARISON:  01/20/2015 FINDINGS: Cardiovascular: Satisfactory opacification of the pulmonary arteries to the segmental level. No evidence of pulmonary embolism. Moderate global cardiomegaly. Severe coronary atherosclerotic calcifications. Scattered atherosclerotic calcifications of the thoracic aorta. Mediastinum/Nodes: No enlarged mediastinal, hilar, or axillary lymph nodes. Thyroid gland, trachea, and esophagus demonstrate no significant findings. Lungs/Pleura: Mild diffuse mosaic attenuation pattern, likely secondary to expiratory image acquisition. Small symmetric bilateral pleural effusions with associated bibasilar passive atelectasis. Similar appearing 6 mm rounded calcific density in the basilar posterior right pleural space. No pneumothorax. Upper Abdomen: The visualized upper abdomen is within  normal limits. Musculoskeletal: Well-opposed median sternotomy. No acute osseous findings. Review of the MIP images confirms the above findings. IMPRESSION: Vascular: 1. No evidence of pulmonary embolism. 2. Coronary and aortic atherosclerosis (ICD10-I70.0). 3. Moderate global cardiomegaly. Non-Vascular: 1. Small bilateral pleural effusions with associated bibasilar passive atelectasis. 2. Diffuse mosaic attenuation pattern of the pulmonary parenchyma, likely secondary to expiratory image acquisition. Ruthann Cancer, MD [facility] Radiology Electronically Signed   By: Ruthann Cancer MD   On: 06/19/2020 14:14   DG Chest Port 1 View  Result Date: 06/18/2020 CLINICAL DATA:  Shortness of breath EXAM: PORTABLE CHEST 1 VIEW COMPARISON:  June 17, 2020. FINDINGS: There is a persistent small left pleural  effusion with consolidation in the left lower lung region. Small right pleural effusion noted. Focal airspace opacity lateral right base again noted without change. No new opacity evident. There is cardiomegaly with pulmonary vascularity normal. There is aortic atherosclerosis. Patient is status post coronary artery bypass grafting. No appreciable adenopathy by portable radiography. Surgical clips noted in left axilla. IMPRESSION: Stable cardiomegaly. Small pleural effusions bilaterally. Consolidation consistent with atelectasis and potential superimposed pneumonia left lower lobe. Suspect small focus of infiltrate lateral right base. Appearance of the lungs is stable compared to 1 day prior. Status post coronary artery bypass grafting. Aortic Atherosclerosis (ICD10-I70.0). Electronically Signed   By: Lowella Grip III M.D.   On: 06/18/2020 09:35   DG Chest Portable 1 View  Result Date: 06/17/2020 CLINICAL DATA:  Short of breath, hypoxia, history of CHF EXAM: PORTABLE CHEST 1 VIEW COMPARISON:  Prior chest x-ray 03/29/2015 FINDINGS: Enlarged cardiopericardial silhouette  consistent with cardiomegaly. Atherosclerotic calcifications visualized in the transverse aorta. Patient is status post median sternotomy for multivessel CABG. Bibasilar airspace opacities are present with blunting of the costophrenic angles consistent with a combination of layering pleural fluid with atelectasis and/or infiltrate. Mild vascular congestion without overt edema. Chronic bronchitic changes and interstitial prominence are similar compared to relatively remote prior imaging. No pneumothorax. Surgical clips again seen in the left axilla. No acute osseous abnormality. IMPRESSION: 1. Cardiomegaly and pulmonary vascular congestion bordering on mild edema. 2. Bilateral moderate layering pleural effusions with associated bibasilar atelectasis and/or infiltrate. 3. Aortic atherosclerosis. Electronically Signed   By: Jacqulynn Cadet M.D.   On: 06/17/2020 12:41   ECHOCARDIOGRAM COMPLETE  Result Date: 06/18/2020    ECHOCARDIOGRAM REPORT   Patient Name:   LATANZA PFEFFERKORN Date of Exam: 06/18/2020 Medical Rec #:  [id number]  Height:       66.0 in Accession #:    [id number] Weight:       152.6 lb Date of Birth:  12-29-1924  BSA:          1.782 m Patient Age:    52 years   BP:           122/75 mmHg Patient Gender: F          HR:           85 bpm. Exam Location:  Inpatient Procedure: 2D Echo, Cardiac Doppler and Color Doppler Indications:    CHF  History:        Patient has prior history of Echocardiogram examinations, most                 recent 03/22/2014. CHF; Prior CABG. CABG 10/09/2013.  Sonographer:    [place] Referring Phys: [id number] [place]  1. Left ventricular ejection fraction, by estimation, is 55 to 60%. The left ventricle has normal function. The left ventricle has no regional wall motion abnormalities. There is moderate left ventricular hypertrophy. Left ventricular diastolic parameters are consistent with Grade II diastolic dysfunction (pseudonormalization). Elevated left ventricular  end-diastolic pressure. There is the interventricular septum is flattened in diastole ('D' shaped left ventricle), consistent with right ventricular volume overload and the interventricular septum is flattened in systole, consistent with right ventricular pressure overload.  2. Right ventricular systolic function is severely reduced. The right ventricular size is mildly enlarged. There is severely elevated pulmonary artery systolic pressure. The estimated right ventricular systolic pressure is 44.0 mmHg.  3. Left atrial size was mildly dilated.  4. Large pleural effusion in the left lateral region.  5. The mitral valve is grossly normal. Mild mitral valve regurgitation.  6. Tricuspid valve regurgitation is moderate.  7. The NCC and the LCC are fused. . The aortic valve is tricuspid. Aortic valve regurgitation is not visualized. Mild aortic valve stenosis.  8. The inferior vena cava is dilated in size with <50% respiratory variability, suggesting right atrial pressure of 15 mmHg. FINDINGS  Left Ventricle: Left ventricular ejection fraction, by estimation, is 55 to 60%. The left ventricle has normal function. The left ventricle has no regional wall motion abnormalities. The left ventricular internal cavity size was normal in size. There is  moderate left ventricular hypertrophy. The interventricular septum is flattened in diastole ('D' shaped left ventricle), consistent with right ventricular volume overload and the interventricular septum is flattened in systole, consistent with right ventricular pressure overload. Left ventricular diastolic parameters are consistent with Grade II diastolic dysfunction (pseudonormalization). Elevated left ventricular end-diastolic pressure. Right Ventricle: The right ventricular size is mildly enlarged. Right vetricular wall thickness was not well visualized. Right ventricular systolic function is severely reduced. There is severely elevated pulmonary artery systolic pressure. The  tricuspid  regurgitant velocity is 3.47 m/s, and with an assumed right atrial pressure of 15 mmHg, the estimated right ventricular systolic pressure is 80.9 mmHg. Left Atrium: Left atrial size was mildly dilated. Right Atrium: Right atrial size was normal in size. Pericardium: There is no evidence of pericardial effusion. Mitral Valve: The mitral valve is grossly normal. There is mild thickening of the mitral valve leaflet(s). There is mild calcification of the mitral valve leaflet(s). Mild mitral annular calcification. Mild mitral valve regurgitation. Tricuspid Valve: The tricuspid valve is normal in structure. Tricuspid valve regurgitation is moderate. Aortic Valve: The [place] and the [place] are fused. The aortic valve is tricuspid. Aortic valve regurgitation is not visualized. Mild aortic stenosis is present. Aortic valve mean gradient measures 7.0 mmHg. Aortic valve peak gradient measures 10.4 mmHg. Aortic  valve area, by VTI measures 1.40 cm. Pulmonic Valve: The pulmonic valve was normal in structure. Pulmonic valve regurgitation is mild. Aorta: The aortic root and ascending aorta are structurally normal, with no evidence of dilitation. Venous: The inferior vena cava is dilated in size with less than 50% respiratory variability, suggesting right atrial pressure of 15 mmHg. IAS/Shunts: No atrial level shunt detected by color flow Doppler. Additional Comments: There is a large pleural effusion in the left lateral region.  LEFT VENTRICLE PLAX 2D LVIDd:         3.60 cm     Diastology LVIDs:         2.60 cm     LV e' medial:    2.81 cm/s LV PW:         1.25 cm     LV E/e' medial:  27.7 LV IVS:        1.50 cm     LV e' lateral:   2.69 cm/s LVOT diam:     2.00 cm     LV E/e' lateral: 29.0 LV SV:         50 LV SV Index:   28 LVOT Area:     3.14 cm  LV Volumes (MOD) LV vol d, MOD A2C: 59.8 ml LV vol d, MOD A4C: 46.4 ml LV vol s, MOD A4C: 21.5 ml LV SV MOD A4C:     46.4 ml RIGHT VENTRICLE RV S prime:     5.93 cm/s  PULMONARY  VEINS TAPSE (M-mode): 0.7 cm     A  Reversal Duration: 53.00 msec                            A Reversal Velocity: 26.10 cm/s                            Diastolic Velocity:  52.84 cm/s                            S/D Velocity:        1.50                            Systolic Velocity:   13.24 cm/s LEFT ATRIUM             Index       RIGHT ATRIUM           Index LA diam:        3.80 cm 2.13 cm/m  RA Area:     17.30 cm LA Vol (A2C):   51.7 ml 29.01 ml/m RA Volume:   50.00 ml  28.05 ml/m LA Vol (A4C):   62.5 ml 35.06 ml/m LA Biplane Vol: 62.4 ml 35.01 ml/m  AORTIC VALVE                    PULMONIC VALVE AV Area (Vmax):    1.60 cm     PV Vmax:       0.86 m/s AV Area (Vmean):   1.30 cm     PV Vmean:      54.600 cm/s AV Area (VTI):     1.40 cm     PV VTI:        0.132 m AV Vmax:           161.00 cm/s  PV Peak grad:  2.9 mmHg AV Vmean:          131.000 cm/s PV Mean grad:  1.0 mmHg AV VTI:            0.354 m AV Peak Grad:      10.4 mmHg AV Mean Grad:      7.0 mmHg LVOT Vmax:         82.20 cm/s LVOT Vmean:        54.400 cm/s LVOT VTI:          0.158 m LVOT/AV VTI ratio: 0.45  AORTA Ao Root diam: 3.50 cm Ao Asc diam:  3.50 cm MITRAL VALVE               TRICUSPID VALVE MV Area (PHT): 2.99 cm    TR Peak grad:   48.2 mmHg MV Decel Time: 254 msec    TR Vmax:        347.00 cm/s MV E velocity: 77.90 cm/s MV A velocity: 90.90 cm/s  SHUNTS MV E/A ratio:  0.86        Systemic VTI:  0.16 m                            Systemic Diam: 2.00 cm Mertie Moores MD Electronically signed by Mertie Moores MD Signature Date/Time: 06/18/2020/11:40:52 AM    Final     Lab Data:  CBC: Recent Labs  Lab 06/17/20 1126  WBC 9.3  NEUTROABS 7.7  HGB 17.4*  HCT  53.4*  MCV 104.3*  PLT 196   Basic Metabolic Panel: Recent Labs  Lab 06/17/20 1126 06/18/20 0250 06/19/20 0155 06/20/20 0330 06/21/20 0141  NA 133* 136 138 135 139  K 4.7 4.1 4.0 3.1* 4.0  CL 98 99 96* 95* 98  CO2 26 26 37* 34* 31  GLUCOSE 267* 124* 126* 176* 152*  BUN  21 17 16 15 19   CREATININE 0.92 0.78 1.04* 1.03* 0.92  CALCIUM 10.1 9.6 10.0 9.5 9.8   GFR: Estimated Creatinine Clearance: 35.8 mL/min (by C-G formula based on SCr of 0.92 mg/dL). Liver Function Tests: No results for input(s): AST, ALT, ALKPHOS, BILITOT, PROT, ALBUMIN in the last 168 hours. No results for input(s): LIPASE, AMYLASE in the last 168 hours. No results for input(s): AMMONIA in the last 168 hours. Coagulation Profile: No results for input(s): INR, PROTIME in the last 168 hours. Cardiac Enzymes: No results for input(s): CKTOTAL, CKMB, CKMBINDEX, TROPONINI in the last 168 hours. BNP (last 3 results) No results for input(s): PROBNP in the last 8760 hours. HbA1C: No results for input(s): HGBA1C in the last 72 hours. CBG: Recent Labs  Lab 06/20/20 1124 06/20/20 1646 06/20/20 2111 06/21/20 0616 06/21/20 1157  GLUCAP 213* 116* 243* 156* 200*   Lipid Profile: No results for input(s): CHOL, HDL, LDLCALC, TRIG, CHOLHDL, LDLDIRECT in the last 72 hours. Thyroid Function Tests: No results for input(s): TSH, T4TOTAL, FREET4, T3FREE, THYROIDAB in the last 72 hours. Anemia Panel: No results for input(s): VITAMINB12, FOLATE, FERRITIN, TIBC, IRON, RETICCTPCT in the last 72 hours. Urine analysis:    Component Value Date/Time   COLORURINE AMBER (A) 03/22/2014 1509   APPEARANCEUR CLOUDY (A) 03/22/2014 1509   LABSPEC 1.024 03/22/2014 1509   PHURINE 5.0 03/22/2014 1509   GLUCOSEU NEGATIVE 03/22/2014 1509   HGBUR NEGATIVE 03/22/2014 1509   BILIRUBINUR SMALL (A) 03/22/2014 1509   KETONESUR 15 (A) 03/22/2014 1509   PROTEINUR 30 (A) 03/22/2014 1509   UROBILINOGEN 0.2 03/22/2014 1509   NITRITE NEGATIVE 03/22/2014 1509   LEUKOCYTESUR SMALL (A) 03/22/2014 1509     Seraphine Gudiel M.D. [facility] 06/21/2020, 2:46 PM  Available via Epic secure chat 7am-7pm After 7 pm, please refer to night coverage provider listed on amion.

## 2020-06-21 NOTE — Progress Notes (Signed)
Pt. Currently on 3L [place] resting comfortable, no BIPAP needed at this time, RT will continue to monitor.

## 2020-06-21 NOTE — Progress Notes (Signed)
Progress Note  Patient Name: Alexzandra Bilton Date of Encounter: 06/21/2020  Primary Cardiologist: Sanda Klein, MD   Subjective   Breathing is improved.  Able to walk to the door with PT/OT.  Feels closer to her normal   Inpatient Medications    Scheduled Meds: . aspirin EC  81 mg Oral Daily  . atorvastatin  80 mg Oral QPM  . carvedilol  25 mg Oral BID WC  . cholecalciferol  5,000 Units Oral Daily  . darifenacin  15 mg Oral Daily  . enoxaparin (LOVENOX) injection  40 mg Subcutaneous Q24H  . furosemide  20 mg Intravenous Daily  . insulin aspart  0-15 Units Subcutaneous TID WC  . insulin glargine  5 Units Subcutaneous QHS  . levothyroxine  150 mcg Oral Daily  . magnesium oxide  400 mg Oral BID  . sodium chloride flush  3 mL Intravenous Q12H   Continuous Infusions: . sodium chloride     PRN Meds: sodium chloride, acetaminophen, diclofenac Sodium, docusate sodium, hydrALAZINE, lip balm, ondansetron (ZOFRAN) IV, sodium chloride flush   Vital Signs    Vitals:   06/20/20 1946 06/21/20 0111 06/21/20 0405 06/21/20 0806  BP: 117/63  (!) 150/78 122/77  Pulse: 62  60 62  Resp: 18  18   Temp: 98 F (36.7 C)  98.5 F (36.9 C) 98.6 F (37 C)  TempSrc: Oral  Oral Oral  SpO2: 90%  95% 92%  Weight:  61.7 kg    Height:        Intake/Output Summary (Last 24 hours) at 06/21/2020 1114 Last data filed at 06/21/2020 0813 Gross per 24 hour  Intake 664 ml  Output 850 ml  Net -186 ml   Filed Weights   06/19/20 0217 06/20/20 0155 06/21/20 0111  Weight: 62.6 kg 61.3 kg 61.7 kg    Telemetry    SR to sinus bradycardia - Personally Reviewed  ECG    No new last 24 - Personally Reviewed  Physical Exam   GEN: No acute distress.   Neck: JVD improved Cardiac: RRR, no rubs, or gallops; holosystolic murmur heard Respiratory: Improved crackles no WOB GI: Soft, nontender, non-distended but with dullness to percussion MS: No edema; No deformity. Neuro:  Nonfocal  Psych: Normal  affect   Labs    Chemistry Recent Labs  Lab 06/19/20 0155 06/20/20 0330 06/21/20 0141  NA 138 135 139  K 4.0 3.1* 4.0  CL 96* 95* 98  CO2 37* 34* 31  GLUCOSE 126* 176* 152*  BUN 16 15 19   CREATININE 1.04* 1.03* 0.92  CALCIUM 10.0 9.5 9.8  GFRNONAA 50* 51* 58*  ANIONGAP 5 6 10      Hematology Recent Labs  Lab 06/17/20 1126  WBC 9.3  RBC 5.12*  HGB 17.4*  HCT 53.4*  MCV 104.3*  MCH 34.0  MCHC 32.6  RDW 13.8  PLT 209    Cardiac EnzymesNo results for input(s): TROPONINI in the last 168 hours. No results for input(s): TROPIPOC in the last 168 hours.   BNP Recent Labs  Lab 06/17/20 1127 06/19/20 0155  BNP 892.4* 700.5*     DDimer  Recent Labs  Lab 06/19/20 1049  DDIMER 0.87*     Radiology    CT ANGIO CHEST PE W OR WO CONTRAST  Result Date: 06/19/2020 CLINICAL DATA:  87 year old female with clinical concern for pulmonary embolism. EXAM: CT ANGIOGRAPHY CHEST WITH CONTRAST TECHNIQUE: Multidetector CT imaging of the chest was performed using the standard protocol during  bolus administration of intravenous contrast. Multiplanar CT image reconstructions and MIPs were obtained to evaluate the vascular anatomy. CONTRAST:  100 mL Omnipaque 350, intravenous COMPARISON:  01/20/2015 FINDINGS: Cardiovascular: Satisfactory opacification of the pulmonary arteries to the segmental level. No evidence of pulmonary embolism. Moderate global cardiomegaly. Severe coronary atherosclerotic calcifications. Scattered atherosclerotic calcifications of the thoracic aorta. Mediastinum/Nodes: No enlarged mediastinal, hilar, or axillary lymph nodes. Thyroid gland, trachea, and esophagus demonstrate no significant findings. Lungs/Pleura: Mild diffuse mosaic attenuation pattern, likely secondary to expiratory image acquisition. Small symmetric bilateral pleural effusions with associated bibasilar passive atelectasis. Similar appearing 6 mm rounded calcific density in the basilar posterior right  pleural space. No pneumothorax. Upper Abdomen: The visualized upper abdomen is within normal limits. Musculoskeletal: Well-opposed median sternotomy. No acute osseous findings. Review of the MIP images confirms the above findings. IMPRESSION: Vascular: 1. No evidence of pulmonary embolism. 2. Coronary and aortic atherosclerosis (ICD10-I70.0). 3. Moderate global cardiomegaly. Non-Vascular: 1. Small bilateral pleural effusions with associated bibasilar passive atelectasis. 2. Diffuse mosaic attenuation pattern of the pulmonary parenchyma, likely secondary to expiratory image acquisition. Ruthann Cancer, MD [facility] Radiology Electronically Signed   By: Ruthann Cancer MD   On: 06/19/2020 14:14    Cardiac Studies    1. Left ventricular ejection fraction, by estimation, is 55 to 60%. The  left ventricle has normal function. The left ventricle has no regional  wall motion abnormalities. There is moderate left ventricular hypertrophy.  Left ventricular diastolic  parameters are consistent with Grade II diastolic dysfunction  (pseudonormalization). Elevated left ventricular end-diastolic pressure.  There is the interventricular septum is flattened in diastole ('D' shaped  left ventricle), consistent with right  ventricular volume overload and the interventricular septum is flattened  in systole, consistent with right ventricular pressure overload.   2. Right ventricular systolic function is severely reduced. The right  ventricular size is mildly enlarged. There is severely elevated pulmonary  artery systolic pressure. The estimated right ventricular systolic  pressure is 81.8 mmHg.   3. Left atrial size was mildly dilated.   4. Large pleural effusion in the left lateral region.   5. The mitral valve is grossly normal. Mild mitral valve regurgitation.   6. Tricuspid valve regurgitation is moderate.   7. The NCC and the LCC are fused. . The aortic valve  is tricuspid. Aortic  valve regurgitation is not visualized. Mild aortic valve stenosis.   8. The inferior vena cava is dilated in size with <50% respiratory  variability, suggesting right atrial pressure of 15 mmHg.   Patient Profile     87 y.o. female with CAD s/p CV CABG, HTN with DM, HLD with DM, HFpEF with subacute RV Failure and PH, Mild AS who presented with hypoxic respiratory failure  Assessment & Plan     Heart Failure presevered Ejection Fraction- decompensated with acute on chronic hypoxic respiratory failure Pulmonary Hypertension (Unclear etiology) CAD s/p Prior CABG Hypertension with DM HLD with DM - NYHA class II, Stage C, hypervolemic, etiology from CAD suspected - asymptomatic from CAD; anatomy LIMA-LAD, SVG-OM1, SVG-OM2, SVG-PD -- 2015 - Diuretic regimen: Lasix 40 mg IV for now balancing CrCL and volume status; potential MRA for 06/22/20 - Discussed the importance of fluid restriction of < 2 L, salt restriction, and checking daily weights  - Replace electrolytes PRN and keep K>4 and Mg>2. - Coreg 25 mg PO BID - SGLT2i trial seeing patient today  - continue ASA, Statin 80 mg   Unable  to wean off of oxygen today (3L); will increase lasix dose.   Discussed with patient and nurse  For questions or updates, please contact [place] Please consult [email or web address] for contact info under Cardiology/STEMI.      Signed, Werner Lean, MD  06/21/2020, 11:14 AM

## 2020-06-21 NOTE — Plan of Care (Signed)
  Problem: Safety: Goal: Ability to remain free from injury will improve Outcome: Progressing   

## 2020-06-21 NOTE — Progress Notes (Signed)
Physical Therapy Treatment Patient Details Name: Marissa Jefferson MRN: [id number] DOB: 14-Jun-1924 Today's Date: 06/21/2020    History of Present Illness Pt is a 87 y.o. female admitted to ED from [place] office on 06/17/2020 with reports of SOB, hypoxia, and BLE edema. CXR with pulmonary edema. Workup for decompensated HF with acute on chronic hypoxic respiratory failure. PMH includes chronic diastolic CHF, HTN, HLD, CAD (s/p CABG), hypothyroidism, IIDM.   PT Comments    Pt progressing well with mobility. Today's session focused on transfer and gait training with RW; pt shows some improvement with activity tolerance during mobility with intermittent min guard. Pt requesting information regarding increased support at home (I.e. running errands, household tasks), Case Manager notified. Will continue to follow acutely to address established goals.  Resting SpO2 94% on 4L O2 [place] SpO2 down to 80% on RA with ambulation Resting SpO2 up to >/88% on 3L O2    Follow Up Recommendations  Home health PT;Supervision - Intermittent     Equipment Recommendations  None recommended by PT    Recommendations for Other Services       Precautions / Restrictions Precautions Precautions: Fall;Other (comment) Precaution Comments: Watch SpO2 Restrictions Weight Bearing Restrictions: No    Mobility  Bed Mobility Overal bed mobility: Modified Independent Bed Mobility: Supine to Sit     Supine to sit: HOB elevated          Transfers Overall transfer level: Needs assistance Equipment used: Rolling walker (2 wheeled) Transfers: Sit to/from Stand Sit to Stand: Min guard;Supervision         General transfer comment: Initial stand from EOB to RW, pt attempting to pull on RW and unable to stand with it's tipping over, additional trial with cues for hand placement and min guard for balance; multiple sit<>stands from recliner, pt with 1x verbal cue of hand placement, then able to carryover to subsequent  repeated trials, supervision for safety  Ambulation/Gait Ambulation/Gait assistance: Min guard Gait Distance (Feet): 30 Feet Assistive device: Rolling walker (2 wheeled) Gait Pattern/deviations: Step-through pattern;Decreased stride length;Trunk flexed Gait velocity: Decreased   General Gait Details: Slow, mostly steady gait with RW and intermittent min guard for balance; DOE 2/4 compared to prior sessions; SpO2 down to 80% on RA, up to 88% on 3L with seated rest   Stairs             Wheelchair Mobility    Modified Rankin (Stroke Patients Only)       Balance Overall balance assessment: Needs assistance Sitting-balance support: No upper extremity supported;Feet supported Sitting balance-Leahy Scale: Good     Standing balance support: Single extremity supported;Bilateral upper extremity supported Standing balance-Leahy Scale: Fair Standing balance comment: can static stand without UE support, unable to accept challenge; static and dynamic stability improved with UE support                            Cognition Arousal/Alertness: Awake/alert Behavior During Therapy: WFL for tasks assessed/performed Overall Cognitive Status: No family/caregiver present to determine baseline cognitive functioning Area of Impairment: Memory;Problem solving                     Memory: Decreased short-term memory       Problem Solving: Requires verbal cues General Comments: Cognition WFL for majority of simple tasks; some repetition during session. Suspect pt near baseline cognition      Exercises Other Exercises Other Exercises: BLE ankle pumps, LAQ  Other Exercises: 3x repeated sit<>stand (heavy reliance on UE support to push to stand) - improved technique compared to prior session    General Comments General comments (skin integrity, edema, etc.): Resting SpO2 94% on 4L o2, HR 61; SpO2 down to 80% on RA with activity; up to >/88% on 3L with seated rest. Pt  requesting increased assist at home for things like running errands and performing household tasks (not CNA-type things like ADLs) - Case Manager notified requesting resources      Pertinent Vitals/Pain Pain Assessment: No/denies pain    Home Living                      Prior Function            PT Goals (current goals can now be found in the care plan section) Progress towards PT goals: Progressing toward goals    Frequency    Min 3X/week      PT Plan Current plan remains appropriate    Co-evaluation              AM-PAC PT "6 Clicks" Mobility   Outcome Measure  Help needed turning from your back to your side while in a flat bed without using bedrails?: None Help needed moving from lying on your back to sitting on the side of a flat bed without using bedrails?: A Little Help needed moving to and from a bed to a chair (including a wheelchair)?: A Little Help needed standing up from a chair using your arms (e.g., wheelchair or bedside chair)?: A Little Help needed to walk in hospital room?: A Little Help needed climbing 3-5 steps with a railing? : A Little 6 Click Score: 19    End of Session Equipment Utilized During Treatment: Gait belt;Oxygen Activity Tolerance: Patient tolerated treatment well Patient left: in chair;with call bell/phone within reach;with chair alarm set Nurse Communication: Mobility status PT Visit Diagnosis: Other abnormalities of gait and mobility (R26.89);Muscle weakness (generalized) (M62.81)     Time: 3007-6226 PT Time Calculation (min) (ACUTE ONLY): 25 min  Charges:  $Gait Training: 8-22 mins $Therapeutic Activity: 8-22 mins                     Mabeline Caras, PT, DPT Acute Rehabilitation Services  Pager [phone] Office [place] 06/21/2020, 1:32 PM

## 2020-06-22 DIAGNOSIS — J9601 Acute respiratory failure with hypoxia: Secondary | ICD-10-CM | POA: Diagnosis not present

## 2020-06-22 LAB — BASIC METABOLIC PANEL
Anion gap: 5 (ref 5–15)
BUN: 18 mg/dL (ref 8–23)
CO2: 33 mmol/L — ABNORMAL HIGH (ref 22–32)
Calcium: 9.9 mg/dL (ref 8.9–10.3)
Chloride: 100 mmol/L (ref 98–111)
Creatinine, Ser: 0.76 mg/dL (ref 0.44–1.00)
GFR, Estimated: >60 mL/min (ref >60)
Glucose, Bld: 120 mg/dL — ABNORMAL HIGH (ref 70–99)
Potassium: 3.9 mmol/L (ref 3.5–5.1)
Sodium: 138 mmol/L (ref 135–145)

## 2020-06-22 LAB — GLUCOSE, CAPILLARY
Glucose-Capillary: 121 mg/dL — ABNORMAL HIGH (ref 70–99)
Glucose-Capillary: 173 mg/dL — ABNORMAL HIGH (ref 70–99)
Glucose-Capillary: 175 mg/dL — ABNORMAL HIGH (ref 70–99)
Glucose-Capillary: 218 mg/dL — ABNORMAL HIGH (ref 70–99)

## 2020-06-22 LAB — BRAIN NATRIURETIC PEPTIDE: B Natriuretic Peptide: 546.5 pg/mL — ABNORMAL HIGH (ref 0.0–100.0)

## 2020-06-22 MED ORDER — FUROSEMIDE 10 MG/ML IJ SOLN
40.0000 mg | Freq: Two times a day (BID) | INTRAMUSCULAR | Status: DC
  Administered 2020-06-22 – 2020-06-24 (×4): 40 mg via INTRAVENOUS
  Filled 2020-06-22 (×4): qty 4

## 2020-06-22 NOTE — Progress Notes (Addendum)
PROGRESS NOTE    Marissa Jefferson  STM:[id number] DOB: Aug 12, 1924 DOA: 06/17/2020 PCP: Merrilee Seashore, MD   Brief Narrative:  87 year old female with history of chronic diastolic CHF, hypertension, hyperlipidemia, CAD s/p 3v CABG, hypothyroidism, DM presented with increasing shortness of breath for last 2 weeks. Patient ported symptoms started 2 weeks ago and gradually worsening, initially exertional.  Then developed constant shortness of breath in the last 2 days, orthopnea at night.  Patient also noted her shoes do not fit. In ED, patient was found to have O2 sats in 70s, improved to 80s on 5 L O2, failed BiPAP trial.  Admitted for further work-up for acute respiratory failure, hypoxia, acute on chronic diastolic CHF.  Cardiology team is following and adjusting her diuretics as necessary.   Assessment & Plan:   Principal Problem:   Acute respiratory failure with hypoxia (HCC) Active Problems:   Essential hypertension   Hypercholesterolemia   Diabetes mellitus type 2, controlled ([place])   Coronary artery disease involving native coronary artery of native heart without angina pectoris   S/P CABG x 4   Hypothyroidism   GERD (gastroesophageal reflux disease)   Acute CHF (congestive heart failure) (HCC)   Pressure injury of skin    Acute respiratory failure with hypoxia (HCC) likely secondary to acute on chronic diastolic CHF, right heart failure. Class III -Still showing signs of volume overload. - 2DEcho: EF 55-60%, G2DD, right heart failure, large left pleural effusion, severe pulmonary hypertension -CT angiogram negative for PE -Cardiology following, home O2 evaluation done, still requiring 4 L O2 -Lasix 40 mg IV twice daily.  Monitor electrolytes replete as needed -Cardiology following  Left pleural effusion -Improved with diuretics  Hypokalemia -Replete as needed    Essential hypertension -Coreg and Lasix    Hypercholesterolemia -Continue Lipitor 80 mg daily     Diabetes mellitus type 2, uncontrolled ([place]), not on long-term insulin with hypoglycemia -Metformin on hold.  A1c 8.2. Lantus 8 units at bedtime, sliding scale and Accu-Cheks    Coronary artery disease S/P CABG -2D echo with right heart failure, preserved EF 55 to 60% -Troponins mildly elevated due to demand ischemia in the setting of acute decompensated diastolic CHF    Hypothyroidism -Continue Synthroid, TSH 2.1    Pressure injury of skin Stage I, medial sacrum, present on admission Wound care per nursing  Generalized debility -PT evaluation recommended home health PT   DVT prophylaxis: Lovenox Code Status: Full code Family Communication:  Called her daughter [name]  Status is: Inpatient  Remains inpatient appropriate because:Inpatient level of care appropriate due to severity of illness   Dispo: The patient is from: Home              Anticipated d/c is to: Home              Patient currently is not medically stable to d/c. Getting IV lasix. Dc when cleared by Cardiology.    Difficult to place patient No  Body mass index is 22.67 kg/m.  Pressure Ulcer 03/22/14 Stage I -  Intact skin with non-blanchable redness of a localized area usually over a bony prominence. red, excoriated (Active)  03/22/14 0821  Location: Sacrum  Location Orientation: Mid  Staging: Stage I -  Intact skin with non-blanchable redness of a localized area usually over a bony prominence.  Wound Description (Comments): red, excoriated  Present on Admission:      Pressure Injury 06/17/20 Sacrum Medial Stage 1 -  Intact skin with non-blanchable redness of  a localized area usually over a bony prominence. (Active)  06/17/20 1927  Location: Sacrum  Location Orientation: Medial  Staging: Stage 1 -  Intact skin with non-blanchable redness of a localized area usually over a bony prominence.  Wound Description (Comments):   Present on Admission: Yes    Subjective: Feels better with diuretics. No chest  pain. Lives on the 2nd floor.  Keeps insisting she wants a foley catheter.   Review of Systems Otherwise negative except as per HPI, including: General: Denies fever, chills, night sweats or unintended weight loss. Resp: Denies cough, wheezing, shortness of breath. Cardiac: Denies chest pain, palpitations, orthopnea, paroxysmal nocturnal dyspnea. GI: Denies abdominal pain, nausea, vomiting, diarrhea or constipation GU: Denies dysuria, frequency, hesitancy or incontinence MS: Denies muscle aches, joint pain or swelling Neuro: Denies headache, neurologic deficits (focal weakness, numbness, tingling), abnormal gait Psych: Denies anxiety, depression, SI/HI/AVH Skin: Denies new rashes or lesions ID: Denies sick contacts, exotic exposures, travel  Examination:  General exam: Appears calm and comfortable. Elderly frail.  Respiratory system: bibasilar crackles.  Cardiovascular system: S1 & S2 heard, RRR. No JVD, murmurs, rubs, gallops or clicks. No pedal edema. Gastrointestinal system: Abdomen is nondistended, soft and nontender. No organomegaly or masses felt. Normal bowel sounds heard. Central nervous system: Alert and oriented. No focal neurological deficits. Extremities: Symmetric 5 x 5 power. Skin: No rashes, lesions or ulcers Psychiatry: Judgement and insight appear normal. Mood & affect appropriate.     Objective: Vitals:   06/21/20 2012 06/22/20 0443 06/22/20 0810 06/22/20 1121  BP: 119/65 (!) 144/76 134/69 132/81  Pulse: (!) 55 93    Resp: 16  18 18   Temp: 98.7 F (37.1 C) 98.7 F (37.1 C) 97.7 F (36.5 C) 98.8 F (37.1 C)  TempSrc: Oral Oral Oral Oral  SpO2:   93% 95%  Weight:  63.7 kg    Height:        Intake/Output Summary (Last 24 hours) at 06/22/2020 1527 Last data filed at 06/22/2020 1127 Gross per 24 hour  Intake 423 ml  Output 2050 ml  Net -1627 ml   Filed Weights   06/20/20 0155 06/21/20 0111 06/22/20 0443  Weight: 61.3 kg 61.7 kg 63.7 kg     Data  Reviewed:   CBC: Recent Labs  Lab 06/17/20 1126  WBC 9.3  NEUTROABS 7.7  HGB 17.4*  HCT 53.4*  MCV 104.3*  PLT 782   Basic Metabolic Panel: Recent Labs  Lab 06/18/20 0250 06/19/20 0155 06/20/20 0330 06/21/20 0141 06/22/20 0701  NA 136 138 135 139 138  K 4.1 4.0 3.1* 4.0 3.9  CL 99 96* 95* 98 100  CO2 26 37* 34* 31 33*  GLUCOSE 124* 126* 176* 152* 120*  BUN 17 16 15 19 18   CREATININE 0.78 1.04* 1.03* 0.92 0.76  CALCIUM 9.6 10.0 9.5 9.8 9.9   GFR: Estimated Creatinine Clearance: 41.1 mL/min (by C-G formula based on SCr of 0.76 mg/dL). Liver Function Tests: No results for input(s): AST, ALT, ALKPHOS, BILITOT, PROT, ALBUMIN in the last 168 hours. No results for input(s): LIPASE, AMYLASE in the last 168 hours. No results for input(s): AMMONIA in the last 168 hours. Coagulation Profile: No results for input(s): INR, PROTIME in the last 168 hours. Cardiac Enzymes: No results for input(s): CKTOTAL, CKMB, CKMBINDEX, TROPONINI in the last 168 hours. BNP (last 3 results) No results for input(s): PROBNP in the last 8760 hours. HbA1C: No results for input(s): HGBA1C in the last 72 hours. CBG: Recent  Labs  Lab 06/21/20 1157 06/21/20 1644 06/21/20 2110 06/22/20 0603 06/22/20 1124  GLUCAP 200* 191* 128* 121* 173*   Lipid Profile: No results for input(s): CHOL, HDL, LDLCALC, TRIG, CHOLHDL, LDLDIRECT in the last 72 hours. Thyroid Function Tests: No results for input(s): TSH, T4TOTAL, FREET4, T3FREE, THYROIDAB in the last 72 hours. Anemia Panel: No results for input(s): VITAMINB12, FOLATE, FERRITIN, TIBC, IRON, RETICCTPCT in the last 72 hours. Sepsis Labs: No results for input(s): PROCALCITON, LATICACIDVEN in the last 168 hours.  Recent Results (from the past 240 hour(s))  Resp Panel by RT-PCR (Flu A&B, Covid) Nasopharyngeal Swab     Status: None   Collection Time: 06/17/20  2:20 PM   Specimen: Nasopharyngeal Swab; Nasopharyngeal(NP) swabs in vial transport medium   Result Value Ref Range Status   SARS Coronavirus 2 by RT PCR NEGATIVE NEGATIVE Final    Comment: (NOTE) SARS-CoV-2 target nucleic acids are NOT DETECTED.  The SARS-CoV-2 RNA is generally detectable in upper respiratory specimens during the acute phase of infection. The lowest concentration of SARS-CoV-2 viral copies this assay can detect is 138 copies/mL. A negative result does not preclude SARS-Cov-2 infection and should not be used as the sole basis for treatment or other patient management decisions. A negative result may occur with  improper specimen collection/handling, submission of specimen other than nasopharyngeal swab, presence of viral mutation(s) within the areas targeted by this assay, and inadequate number of viral copies(<138 copies/mL). A negative result must be combined with clinical observations, patient history, and epidemiological information. The expected result is Negative.  Fact Sheet for Patients:  EntrepreneurPulse.com.au  Fact Sheet for Healthcare Providers:  IncredibleEmployment.be  This test is no t yet approved or cleared by the [place] FDA and  has been authorized for detection and/or diagnosis of SARS-CoV-2 by FDA under an Emergency Use Authorization (EUA). This EUA will remain  in effect (meaning this test can be used) for the duration of the COVID-19 declaration under Section 564(b)(1) of the Act, 21 U.S.C.section 360bbb-3(b)(1), unless the authorization is terminated  or revoked sooner.       Influenza A by PCR NEGATIVE NEGATIVE Final   Influenza B by PCR NEGATIVE NEGATIVE Final    Comment: (NOTE) The Xpert Xpress SARS-CoV-2/FLU/RSV plus assay is intended as an aid in the diagnosis of influenza from Nasopharyngeal swab specimens and should not be used as a sole basis for treatment. Nasal washings and aspirates are unacceptable for Xpert Xpress SARS-CoV-2/FLU/RSV testing.  Fact Sheet for  Patients: EntrepreneurPulse.com.au  Fact Sheet for Healthcare Providers: IncredibleEmployment.be  This test is not yet approved or cleared by the [place] FDA and has been authorized for detection and/or diagnosis of SARS-CoV-2 by FDA under an Emergency Use Authorization (EUA). This EUA will remain in effect (meaning this test can be used) for the duration of the COVID-19 declaration under Section 564(b)(1) of the Act, 21 U.S.C. section 360bbb-3(b)(1), unless the authorization is terminated or revoked.  Performed at [facility], [place] [address] [place], [place] [address]          Radiology Studies: No results found.      Scheduled Meds: . aspirin EC  81 mg Oral Daily  . atorvastatin  80 mg Oral QPM  . carvedilol  25 mg Oral BID WC  . cholecalciferol  5,000 Units Oral Daily  . darifenacin  15 mg Oral Daily  . enoxaparin (LOVENOX) injection  40 mg Subcutaneous Q24H  . furosemide  40 mg Intravenous BID  .  insulin aspart  0-15 Units Subcutaneous TID WC  . insulin glargine  8 Units Subcutaneous QHS  . levothyroxine  150 mcg Oral Daily  . magnesium oxide  400 mg Oral BID  . sodium chloride flush  3 mL Intravenous Q12H   Continuous Infusions: . sodium chloride       LOS: 5 days   Time spent= 35 mins    Katleen Carraway Arsenio Loader, MD [facility]  If 7PM-7AM, please contact night-coverage  06/22/2020, 3:27 PM

## 2020-06-22 NOTE — Progress Notes (Addendum)
Progress Note  Patient Name: Marissa Jefferson Date of Encounter: 06/22/2020  Primary Cardiologist: Sanda Klein, MD   Subjective   Breathing is improved.  Able to walk to the door with PT/OT.  Feels closer to her normal   Inpatient Medications    Scheduled Meds: . aspirin EC  81 mg Oral Daily  . atorvastatin  80 mg Oral QPM  . carvedilol  25 mg Oral BID WC  . cholecalciferol  5,000 Units Oral Daily  . darifenacin  15 mg Oral Daily  . enoxaparin (LOVENOX) injection  40 mg Subcutaneous Q24H  . furosemide  40 mg Intravenous Daily  . insulin aspart  0-15 Units Subcutaneous TID WC  . insulin glargine  8 Units Subcutaneous QHS  . levothyroxine  150 mcg Oral Daily  . magnesium oxide  400 mg Oral BID  . sodium chloride flush  3 mL Intravenous Q12H   Continuous Infusions: . sodium chloride     PRN Meds: sodium chloride, acetaminophen, diclofenac Sodium, docusate sodium, hydrALAZINE, lip balm, ondansetron (ZOFRAN) IV, sodium chloride flush   Vital Signs    Vitals:   06/21/20 0806 06/21/20 2012 06/22/20 0443 06/22/20 0810  BP: 122/77 119/65 (!) 144/76 134/69  Pulse: 62 (!) 55 93   Resp:  16  18  Temp: 98.6 F (37 C) 98.7 F (37.1 C) 98.7 F (37.1 C) 97.7 F (36.5 C)  TempSrc: Oral Oral Oral Oral  SpO2: 92%   93%  Weight:   63.7 kg   Height:        Intake/Output Summary (Last 24 hours) at 06/22/2020 1001 Last data filed at 06/22/2020 0737 Gross per 24 hour  Intake 663 ml  Output 850 ml  Net -187 ml   Filed Weights   06/20/20 0155 06/21/20 0111 06/22/20 0443  Weight: 61.3 kg 61.7 kg 63.7 kg    Telemetry    SR to sinus bradycardia with PVCs - Personally Reviewed  ECG    No new last 24 - Personally Reviewed  Physical Exam   GEN: No acute distress.   Neck: JVD improved Cardiac: RRR, no rubs, or gallops; holosystolic murmur stable Respiratory: Improved crackles no WOB GI: Soft, nontender, non-distended but with dullness to percussion MS: No edema; No  deformity. Neuro:  Nonfocal  Psych: Normal affect   Labs    Chemistry Recent Labs  Lab 06/20/20 0330 06/21/20 0141 06/22/20 0701  NA 135 139 138  K 3.1* 4.0 3.9  CL 95* 98 100  CO2 34* 31 33*  GLUCOSE 176* 152* 120*  BUN 15 19 18   CREATININE 1.03* 0.92 0.76  CALCIUM 9.5 9.8 9.9  GFRNONAA 51* 58* >60  ANIONGAP 6 10 5      Hematology Recent Labs  Lab 06/17/20 1126  WBC 9.3  RBC 5.12*  HGB 17.4*  HCT 53.4*  MCV 104.3*  MCH 34.0  MCHC 32.6  RDW 13.8  PLT 209    Cardiac EnzymesNo results for input(s): TROPONINI in the last 168 hours. No results for input(s): TROPIPOC in the last 168 hours.   BNP Recent Labs  Lab 06/17/20 1127 06/19/20 0155 06/22/20 0701  BNP 892.4* 700.5* 546.5*     DDimer  Recent Labs  Lab 06/19/20 1049  DDIMER 0.87*     Radiology    No results found.  Cardiac Studies    1. Left ventricular ejection fraction, by estimation, is 55 to 60%. The  left ventricle has normal function. The left ventricle has no regional  wall motion abnormalities. There is moderate left ventricular hypertrophy.  Left ventricular diastolic  parameters are consistent with Grade II diastolic dysfunction  (pseudonormalization). Elevated left ventricular end-diastolic pressure.  There is the interventricular septum is flattened in diastole ('D' shaped  left ventricle), consistent with right  ventricular volume overload and the interventricular septum is flattened  in systole, consistent with right ventricular pressure overload.   2. Right ventricular systolic function is severely reduced. The right  ventricular size is mildly enlarged. There is severely elevated pulmonary  artery systolic pressure. The estimated right ventricular systolic  pressure is 83.0 mmHg.   3. Left atrial size was mildly dilated.   4. Large pleural effusion in the left lateral region.   5. The mitral valve is grossly normal. Mild mitral valve regurgitation.   6. Tricuspid valve  regurgitation is moderate.   7. The NCC and the LCC are fused. . The aortic valve is tricuspid. Aortic  valve regurgitation is not visualized. Mild aortic valve stenosis.   8. The inferior vena cava is dilated in size with <50% respiratory  variability, suggesting right atrial pressure of 15 mmHg.   Patient Profile     87 y.o. female with CAD s/p CV CABG, HTN with DM, HLD with DM, HFpEF with subacute RV Failure and PH, Mild AS who presented with hypoxic respiratory failure  Assessment & Plan     Heart Failure presevered Ejection Fraction- decompensated with acute on chronic hypoxic respiratory failure Pulmonary Hypertension (Unclear etiology) CAD s/p Prior CABG Hypertension with DM HLD with DM - NYHA class II, Stage C, hypervolemic, etiology from CAD suspected - asymptomatic from CAD; anatomy LIMA-LAD, SVG-OM1, SVG-OM2, SVG-PD -- 2015 - Diuretic regimen: Lasix 40 mg IV BID for now balancing CrCL and volume status - Discussed the importance of fluid restriction of < 2 L, salt restriction, and checking daily weights  - Replace electrolytes PRN and keep K>4 and Mg>2. - Coreg 25 mg PO BID - SGLT2i trial has seen patient - continue ASA, Statin 80 mg  Discussed at length with patient and primary MD about pros and cons of urinary catheter; including infection risk  Discussed SGLT2i trial briefly- a reasonable intervention for patient but will defer to her judgment and that of her daughter who is a pharmacist  For questions or updates, please contact [facility] Please consult [email or web address] for contact info under Cardiology/STEMI.      Signed, Werner Lean, MD  06/22/2020, 10:01 AM

## 2020-06-22 NOTE — Plan of Care (Signed)
  Problem: Activity: Goal: Capacity to carry out activities will improve Outcome: Progressing   Problem: Cardiac: Goal: Ability to achieve and maintain adequate cardiopulmonary perfusion will improve Outcome: Progressing   

## 2020-06-23 DIAGNOSIS — J9601 Acute respiratory failure with hypoxia: Secondary | ICD-10-CM | POA: Diagnosis not present

## 2020-06-23 LAB — BASIC METABOLIC PANEL
Anion gap: 11 (ref 5–15)
BUN: 23 mg/dL (ref 8–23)
CO2: 30 mmol/L (ref 22–32)
Calcium: 9.8 mg/dL (ref 8.9–10.3)
Chloride: 97 mmol/L — ABNORMAL LOW (ref 98–111)
Creatinine, Ser: 0.9 mg/dL (ref 0.44–1.00)
GFR, Estimated: 60 mL/min — ABNORMAL LOW (ref >60)
Glucose, Bld: 133 mg/dL — ABNORMAL HIGH (ref 70–99)
Potassium: 3.5 mmol/L (ref 3.5–5.1)
Sodium: 138 mmol/L (ref 135–145)

## 2020-06-23 LAB — GLUCOSE, CAPILLARY
Glucose-Capillary: 141 mg/dL — ABNORMAL HIGH (ref 70–99)
Glucose-Capillary: 240 mg/dL — ABNORMAL HIGH (ref 70–99)
Glucose-Capillary: 236 mg/dL — ABNORMAL HIGH (ref 70–99)
Glucose-Capillary: 182 mg/dL — ABNORMAL HIGH (ref 70–99)

## 2020-06-23 LAB — MAGNESIUM: Magnesium: 1.8 mg/dL (ref 1.7–2.4)

## 2020-06-23 MED ORDER — POTASSIUM CHLORIDE CRYS ER 20 MEQ PO TBCR
40.0000 meq | EXTENDED_RELEASE_TABLET | Freq: Once | ORAL | Status: AC
  Administered 2020-06-23: 40 meq via ORAL
  Filled 2020-06-23: qty 2

## 2020-06-23 NOTE — Progress Notes (Signed)
Physical Therapy Treatment Patient Details Name: Pachia Strum MRN: [id number] DOB: 02-19-1925 Today's Date: 06/23/2020    History of Present Illness Pt is a 87 y.o. female admitted to ED from [place] office on 06/17/2020 with reports of SOB, hypoxia, and BLE edema. CXR with pulmonary edema. Workup for decompensated HF with acute on chronic hypoxic respiratory failure. PMH includes chronic diastolic CHF, HTN, HLD, CAD (s/p CABG), hypothyroidism, IIDM.   PT Comments    Pt progressing with mobility. Today's session focused on gait training with RW; pt demonstrates improving activity tolerance, able to ambulate short, household distances with RW and intermittent min guard for balance. Pt motivated to participate and hopeful for d/c home soon; reports daughter to stay with her a few days to assist. Pt reports she hopes she does not have to d/c home with oxygen; educ on potential for this. Will continue to follow acutely.  SpO2 94% on 2L O2 at rest SpO2 84% on RA with ambulation SpO2 quickly up to >/90% on 2L O2 with seated rest    Follow Up Recommendations  Home health PT;Supervision - Intermittent     Equipment Recommendations  None recommended by PT    Recommendations for Other Services       Precautions / Restrictions Precautions Precautions: Fall;Other (comment) Precaution Comments: Watch SpO2 Restrictions Weight Bearing Restrictions: No    Mobility  Bed Mobility Overal bed mobility: Modified Independent Bed Mobility: Supine to Sit     Supine to sit: Modified independent (Device/Increase time);HOB elevated          Transfers Overall transfer level: Needs assistance Equipment used: Rolling walker (2 wheeled) Transfers: Sit to/from Stand Sit to Stand: Supervision         General transfer comment: Pt preparing to stand, stating, "So I count to three, then pull up on walker" - verbal cues for correct hand placement, pt reliant on momentum to power into standing,  supervision for safety  Ambulation/Gait Ambulation/Gait assistance: Min guard Gait Distance (Feet): 56 Feet Assistive device: Rolling walker (2 wheeled) Gait Pattern/deviations: Step-through pattern;Decreased stride length;Trunk flexed Gait velocity: Decreased   General Gait Details: Slow, mostly steady gait with RW and intermittent min guard for balance; pt with increased forward flexed posture and knee instability noted with increased fatigue; pt demonstrates awareness of decreased activity tolerance, provided some verbal cues to self-monitor and determine need for rest break   Stairs             Wheelchair Mobility    Modified Rankin (Stroke Patients Only)       Balance Overall balance assessment: Needs assistance Sitting-balance support: No upper extremity supported;Feet supported Sitting balance-Leahy Scale: Good     Standing balance support: Single extremity supported;Bilateral upper extremity supported;No upper extremity supported Standing balance-Leahy Scale: Fair Standing balance comment: can static stand without UE support, unable to accept challenge; static and dynamic stability improved with UE support                            Cognition Arousal/Alertness: Awake/alert Behavior During Therapy: WFL for tasks assessed/performed Overall Cognitive Status: No family/caregiver present to determine baseline cognitive functioning Area of Impairment: Memory;Problem solving                     Memory: Decreased short-term memory       Problem Solving: Requires verbal cues General Comments: Cognition WFL for majority of simple tasks; some repetition during session. Suspect  pt near baseline cognition      Exercises      General Comments General comments (skin integrity, edema, etc.): SpO2 94% on 2L O2 at rest; down to 84% on RA with mobility, quick to return to >/90% on 2L with seated rest. Pt reports daughter will be staying with her for a  few days to assist upon return home      Pertinent Vitals/Pain Pain Assessment: No/denies pain    Home Living                      Prior Function            PT Goals (current goals can now be found in the care plan section) Progress towards PT goals: Progressing toward goals    Frequency    Min 3X/week      PT Plan Current plan remains appropriate    Co-evaluation              AM-PAC PT "6 Clicks" Mobility   Outcome Measure  Help needed turning from your back to your side while in a flat bed without using bedrails?: None Help needed moving from lying on your back to sitting on the side of a flat bed without using bedrails?: None Help needed moving to and from a bed to a chair (including a wheelchair)?: A Little Help needed standing up from a chair using your arms (e.g., wheelchair or bedside chair)?: A Little Help needed to walk in hospital room?: A Little Help needed climbing 3-5 steps with a railing? : A Little 6 Click Score: 20    End of Session Equipment Utilized During Treatment: Oxygen Activity Tolerance: Patient tolerated treatment well Patient left: in chair;with call bell/phone within reach;with chair alarm set Nurse Communication: Mobility status PT Visit Diagnosis: Other abnormalities of gait and mobility (R26.89);Muscle weakness (generalized) (M62.81)     Time: 3614-4315 PT Time Calculation (min) (ACUTE ONLY): 22 min  Charges:  $Gait Training: 8-22 mins                     Mabeline Caras, PT, DPT Acute Rehabilitation Services  Pager [phone] Office [place] 06/23/2020, 1:28 PM

## 2020-06-23 NOTE — Progress Notes (Signed)
Progress Note  Patient Name: Marissa Jefferson Date of Encounter: 06/23/2020  Primary Cardiologist: Sanda Klein, MD   Subjective   Had desaturation overnight.  Still able to breath ok and feels near baseline.  Inpatient Medications    Scheduled Meds: . aspirin EC  81 mg Oral Daily  . atorvastatin  80 mg Oral QPM  . carvedilol  25 mg Oral BID WC  . cholecalciferol  5,000 Units Oral Daily  . darifenacin  15 mg Oral Daily  . enoxaparin (LOVENOX) injection  40 mg Subcutaneous Q24H  . furosemide  40 mg Intravenous BID  . insulin aspart  0-15 Units Subcutaneous TID WC  . insulin glargine  8 Units Subcutaneous QHS  . levothyroxine  150 mcg Oral Daily  . magnesium oxide  400 mg Oral BID  . sodium chloride flush  3 mL Intravenous Q12H   Continuous Infusions: . sodium chloride     PRN Meds: sodium chloride, acetaminophen, diclofenac Sodium, docusate sodium, hydrALAZINE, lip balm, ondansetron (ZOFRAN) IV, sodium chloride flush   Vital Signs    Vitals:   06/22/20 1650 06/22/20 2034 06/23/20 0434 06/23/20 0800  BP:  115/77 (!) 147/76 115/74  Pulse:  67 65   Resp:  18 18 18   Temp:  97.8 F (36.6 C) 98.6 F (37 C) 98.7 F (37.1 C)  TempSrc:  Oral Oral Oral  SpO2: 93% 92% 92% 90%  Weight:   59.6 kg   Height:        Intake/Output Summary (Last 24 hours) at 06/23/2020 1011 Last data filed at 06/23/2020 0842 Gross per 24 hour  Intake 719 ml  Output 2875 ml  Net -2156 ml   Filed Weights   06/21/20 0111 06/22/20 0443 06/23/20 0434  Weight: 61.7 kg 63.7 kg 59.6 kg    Telemetry    SR - Personally Reviewed  ECG    No new last 24 - Personally Reviewed  Physical Exam   GEN: No acute distress.   Neck: JVD improved Cardiac: RRR, no rubs, or gallops; holosystolic murmur stable Respiratory: Improved crackles no WOB GI: Soft, nontender, non-distended  MS: No edema; No deformity. Neuro:  Nonfocal  Psych: Normal affect   Labs    Chemistry Recent Labs  Lab  06/21/20 0141 06/22/20 0701 06/23/20 0358  NA 139 138 138  K 4.0 3.9 3.5  CL 98 100 97*  CO2 31 33* 30  GLUCOSE 152* 120* 133*  BUN 19 18 23   CREATININE 0.92 0.76 0.90  CALCIUM 9.8 9.9 9.8  GFRNONAA 58* >60 60*  ANIONGAP 10 5 11      Hematology Recent Labs  Lab 06/17/20 1126  WBC 9.3  RBC 5.12*  HGB 17.4*  HCT 53.4*  MCV 104.3*  MCH 34.0  MCHC 32.6  RDW 13.8  PLT 209    Cardiac EnzymesNo results for input(s): TROPONINI in the last 168 hours. No results for input(s): TROPIPOC in the last 168 hours.   BNP Recent Labs  Lab 06/17/20 1127 06/19/20 0155 06/22/20 0701  BNP 892.4* 700.5* 546.5*     DDimer  Recent Labs  Lab 06/19/20 1049  DDIMER 0.87*     Radiology    No results found.  Cardiac Studies    1. Left ventricular ejection fraction, by estimation, is 55 to 60%. The  left ventricle has normal function. The left ventricle has no regional  wall motion abnormalities. There is moderate left ventricular hypertrophy.  Left ventricular diastolic  parameters are consistent with Grade  II diastolic dysfunction  (pseudonormalization). Elevated left ventricular end-diastolic pressure.  There is the interventricular septum is flattened in diastole ('D' shaped  left ventricle), consistent with right  ventricular volume overload and the interventricular septum is flattened  in systole, consistent with right ventricular pressure overload.   2. Right ventricular systolic function is severely reduced. The right  ventricular size is mildly enlarged. There is severely elevated pulmonary  artery systolic pressure. The estimated right ventricular systolic  pressure is 24.8 mmHg.   3. Left atrial size was mildly dilated.   4. Large pleural effusion in the left lateral region.   5. The mitral valve is grossly normal. Mild mitral valve regurgitation.   6. Tricuspid valve regurgitation is moderate.   7. The NCC and the LCC are fused. . The aortic valve is tricuspid.  Aortic  valve regurgitation is not visualized. Mild aortic valve stenosis.   8. The inferior vena cava is dilated in size with <50% respiratory  variability, suggesting right atrial pressure of 15 mmHg.   Patient Profile     87 y.o. female with CAD s/p CV CABG, HTN with DM, HLD with DM, HFpEF with subacute RV Failure and PH, Mild AS who presented with hypoxic respiratory failure  Assessment & Plan    Heart Failure presevered Ejection Fraction- decompensated with acute on chronic hypoxic respiratory failure Pulmonary Hypertension (Unclear etiology) CAD s/p Prior CABG Hypertension with DM HLD with DM - NYHA class II, Stage C, hypervolemic, etiology from CAD suspected - asymptomatic from CAD; anatomy LIMA-LAD, SVG-OM1, SVG-OM2, SVG-PD -- 2015 - Diuretic regimen: Lasix 40 mg IV BID for now balancing CrCL and volume status- discussed with primary MD; will keep this dose presently and possible transition to PO 06/24/20 - Discussed the importance of fluid restriction of < 2 L, salt restriction, and checking daily weights  - Replace electrolytes PRN and keep K>4 and Mg>2. - Coreg 25 mg PO BID - SGLT2i trial has seen patient - continue ASA, Statin 80 mg  Discussed with patient and primary team.  Called Daughter [phone] Discussed that she may have some subclinical depression and that some of her sx started at home one month prior.  Time Spent Directly with Patient:   I have spent a total of 40 minutes with the patient reviewing notes, imaging, EKGs, labs and examining the patient as well as establishing an assessment and plan that was discussed personally with the patient.  > 50% of time was spent in direct patient care and family.   For questions or updates, please contact [facility] Please consult [email or web address] for contact info under Cardiology/STEMI.      Signed, Werner Lean, MD  06/23/2020, 10:11 AM

## 2020-06-23 NOTE — Progress Notes (Signed)
PROGRESS NOTE    Marissa Jefferson  ZOX:[id number] DOB: 12-30-1924 DOA: 06/17/2020 PCP: Merrilee Seashore, MD   Brief Narrative:  87 year old female with history of chronic diastolic CHF, hypertension, hyperlipidemia, CAD s/p 3v CABG, hypothyroidism, DM presented with increasing shortness of breath for last 2 weeks. Patient ported symptoms started 2 weeks ago and gradually worsening, initially exertional.  Then developed constant shortness of breath in the last 2 days, orthopnea at night.  Patient also noted her shoes do not fit. In ED, patient was found to have O2 sats in 70s, improved to 80s on 5 L O2, failed BiPAP trial.  Admitted for further work-up for acute respiratory failure, hypoxia, acute on chronic diastolic CHF.  Cardiology team is following and adjusting her diuretics as necessary.   Assessment & Plan:   Principal Problem:   Acute respiratory failure with hypoxia (HCC) Active Problems:   Essential hypertension   Hypercholesterolemia   Diabetes mellitus type 2, controlled ([place])   Coronary artery disease involving native coronary artery of native heart without angina pectoris   S/P CABG x 4   Hypothyroidism   GERD (gastroesophageal reflux disease)   Acute CHF (congestive heart failure) (HCC)   Pressure injury of skin    Acute respiratory failure with hypoxia (HCC) likely secondary to acute on chronic diastolic CHF, right heart failure. Class III -Still showing signs of volume overload. - 2DEcho: EF 55-60%, G2DD, right heart failure, large left pleural effusion, severe pulmonary hypertension -CT angiogram negative for PE -Cardiology following, home O2 evaluation done, still requiring 4 L O2 -Cont Lasix 40mg  IV BID.  -Cardiology following  Left pleural effusion -Improved with diuretics  Hypokalemia -Replete as needed    Essential hypertension -Coreg and Lasix    Hypercholesterolemia -Continue Lipitor 80 mg daily    Diabetes mellitus type 2, uncontrolled ([place]),  not on long-term insulin with hypoglycemia -Metformin on hold.  A1c 8.2. Lantus 8 units at bedtime, sliding scale and Accu-Cheks    Coronary artery disease S/P CABG -2D echo with right heart failure, preserved EF 55 to 60% -Troponins mildly elevated due to demand ischemia in the setting of acute decompensated diastolic CHF    Hypothyroidism -Continue Synthroid, TSH 2.1    Pressure injury of skin Stage I, medial sacrum, present on admission Wound care per nursing  Generalized debility -PT evaluation recommended home health PT   DVT prophylaxis: Lovenox Code Status: Full code Family Communication:  Called and updated her daughter [name]  Status is: Inpatient  Remains inpatient appropriate because:Inpatient level of care appropriate due to severity of illness   Dispo: The patient is from: Home              Anticipated d/c is to: Home              Patient currently is not medically stable to d/c. Getting IV lasix. Dc when cleared by Cardiology.    Difficult to place patient No  Body mass index is 21.21 kg/m.  Pressure Ulcer 03/22/14 Stage I -  Intact skin with non-blanchable redness of a localized area usually over a bony prominence. red, excoriated (Active)  03/22/14 0821  Location: Sacrum  Location Orientation: Mid  Staging: Stage I -  Intact skin with non-blanchable redness of a localized area usually over a bony prominence.  Wound Description (Comments): red, excoriated  Present on Admission:      Pressure Injury 06/17/20 Sacrum Medial Stage 1 -  Intact skin with non-blanchable redness of a localized area usually  over a bony prominence. (Active)  06/17/20 1927  Location: Sacrum  Location Orientation: Medial  Staging: Stage 1 -  Intact skin with non-blanchable redness of a localized area usually over a bony prominence.  Wound Description (Comments):   Present on Admission: Yes    Subjective: Feels ok, no complaints.  Overnight desaturation down to 87% on RA at  rest.   Review of Systems Otherwise negative except as per HPI, including: General = no fevers, chills, dizziness,  fatigue HEENT/EYES = negative for loss of vision, double vision, blurred vision,  sore throa Cardiovascular= negative for chest pain, palpitation Respiratory/lungs= negative for shortness of breath, cough, wheezing; hemoptysis,  Gastrointestinal= negative for nausea, vomiting, abdominal pain Genitourinary= negative for Dysuria MSK = Negative for arthralgia, myalgias Neurology= Negative for headache, numbness, tingling  Psychiatry= Negative for suicidal and homocidal ideation Skin= Negative for Rash  Examination:  Constitutional: Not in acute distress, 1-2L  Respiratory: mild bibasilar crackles.  Cardiovascular: Normal sinus rhythm, no rubs Abdomen: Nontender nondistended good bowel sounds Musculoskeletal: No edema noted Skin: No rashes seen Neurologic: CN 2-12 grossly intact.  And nonfocal Psychiatric: Normal judgment and insight. Alert and oriented x 3. Normal mood.     Objective: Vitals:   06/22/20 2034 06/23/20 0434 06/23/20 0800 06/23/20 1126  BP: 115/77 (!) 147/76 115/74 122/74  Pulse: 67 65  (!) 58  Resp: 18 18 18 18   Temp: 97.8 F (36.6 C) 98.6 F (37 C) 98.7 F (37.1 C) 98.4 F (36.9 C)  TempSrc: Oral Oral Oral Oral  SpO2: 92% 92% 90% 92%  Weight:  59.6 kg    Height:        Intake/Output Summary (Last 24 hours) at 06/23/2020 1223 Last data filed at 06/23/2020 1019 Gross per 24 hour  Intake 719 ml  Output 2025 ml  Net -1306 ml   Filed Weights   06/21/20 0111 06/22/20 0443 06/23/20 0434  Weight: 61.7 kg 63.7 kg 59.6 kg     Data Reviewed:   CBC: Recent Labs  Lab 06/17/20 1126  WBC 9.3  NEUTROABS 7.7  HGB 17.4*  HCT 53.4*  MCV 104.3*  PLT 242   Basic Metabolic Panel: Recent Labs  Lab 06/19/20 0155 06/20/20 0330 06/21/20 0141 06/22/20 0701 06/23/20 0358  NA 138 135 139 138 138  K 4.0 3.1* 4.0 3.9 3.5  CL 96* 95* 98 100  97*  CO2 37* 34* 31 33* 30  GLUCOSE 126* 176* 152* 120* 133*  BUN 16 15 19 18 23   CREATININE 1.04* 1.03* 0.92 0.76 0.90  CALCIUM 10.0 9.5 9.8 9.9 9.8  MG  --   --   --   --  1.8   GFR: Estimated Creatinine Clearance: 36.6 mL/min (by C-G formula based on SCr of 0.9 mg/dL). Liver Function Tests: No results for input(s): AST, ALT, ALKPHOS, BILITOT, PROT, ALBUMIN in the last 168 hours. No results for input(s): LIPASE, AMYLASE in the last 168 hours. No results for input(s): AMMONIA in the last 168 hours. Coagulation Profile: No results for input(s): INR, PROTIME in the last 168 hours. Cardiac Enzymes: No results for input(s): CKTOTAL, CKMB, CKMBINDEX, TROPONINI in the last 168 hours. BNP (last 3 results) No results for input(s): PROBNP in the last 8760 hours. HbA1C: No results for input(s): HGBA1C in the last 72 hours. CBG: Recent Labs  Lab 06/22/20 1124 06/22/20 1637 06/22/20 2116 06/23/20 0611 06/23/20 1127  GLUCAP 173* 175* 218* 141* 240*   Lipid Profile: No results for input(s): CHOL,  HDL, LDLCALC, TRIG, CHOLHDL, LDLDIRECT in the last 72 hours. Thyroid Function Tests: No results for input(s): TSH, T4TOTAL, FREET4, T3FREE, THYROIDAB in the last 72 hours. Anemia Panel: No results for input(s): VITAMINB12, FOLATE, FERRITIN, TIBC, IRON, RETICCTPCT in the last 72 hours. Sepsis Labs: No results for input(s): PROCALCITON, LATICACIDVEN in the last 168 hours.  Recent Results (from the past 240 hour(s))  Resp Panel by RT-PCR (Flu A&B, Covid) Nasopharyngeal Swab     Status: None   Collection Time: 06/17/20  2:20 PM   Specimen: Nasopharyngeal Swab; Nasopharyngeal(NP) swabs in vial transport medium  Result Value Ref Range Status   SARS Coronavirus 2 by RT PCR NEGATIVE NEGATIVE Final    Comment: (NOTE) SARS-CoV-2 target nucleic acids are NOT DETECTED.  The SARS-CoV-2 RNA is generally detectable in upper respiratory specimens during the acute phase of infection. The  lowest concentration of SARS-CoV-2 viral copies this assay can detect is 138 copies/mL. A negative result does not preclude SARS-Cov-2 infection and should not be used as the sole basis for treatment or other patient management decisions. A negative result may occur with  improper specimen collection/handling, submission of specimen other than nasopharyngeal swab, presence of viral mutation(s) within the areas targeted by this assay, and inadequate number of viral copies(<138 copies/mL). A negative result must be combined with clinical observations, patient history, and epidemiological information. The expected result is Negative.  Fact Sheet for Patients:  EntrepreneurPulse.com.au  Fact Sheet for Healthcare Providers:  IncredibleEmployment.be  This test is no t yet approved or cleared by the [place] FDA and  has been authorized for detection and/or diagnosis of SARS-CoV-2 by FDA under an Emergency Use Authorization (EUA). This EUA will remain  in effect (meaning this test can be used) for the duration of the COVID-19 declaration under Section 564(b)(1) of the Act, 21 U.S.C.section 360bbb-3(b)(1), unless the authorization is terminated  or revoked sooner.       Influenza A by PCR NEGATIVE NEGATIVE Final   Influenza B by PCR NEGATIVE NEGATIVE Final    Comment: (NOTE) The Xpert Xpress SARS-CoV-2/FLU/RSV plus assay is intended as an aid in the diagnosis of influenza from Nasopharyngeal swab specimens and should not be used as a sole basis for treatment. Nasal washings and aspirates are unacceptable for Xpert Xpress SARS-CoV-2/FLU/RSV testing.  Fact Sheet for Patients: EntrepreneurPulse.com.au  Fact Sheet for Healthcare Providers: IncredibleEmployment.be  This test is not yet approved or cleared by the [place] FDA and has been authorized for detection and/or diagnosis of SARS-CoV-2 by FDA under  an Emergency Use Authorization (EUA). This EUA will remain in effect (meaning this test can be used) for the duration of the COVID-19 declaration under Section 564(b)(1) of the Act, 21 U.S.C. section 360bbb-3(b)(1), unless the authorization is terminated or revoked.  Performed at [facility], [place] [address], [place], [place] [address]          Radiology Studies: No results found.      Scheduled Meds: . aspirin EC  81 mg Oral Daily  . atorvastatin  80 mg Oral QPM  . carvedilol  25 mg Oral BID WC  . cholecalciferol  5,000 Units Oral Daily  . darifenacin  15 mg Oral Daily  . enoxaparin (LOVENOX) injection  40 mg Subcutaneous Q24H  . furosemide  40 mg Intravenous BID  . insulin aspart  0-15 Units Subcutaneous TID WC  . insulin glargine  8 Units Subcutaneous QHS  . levothyroxine  150 mcg Oral Daily  .  magnesium oxide  400 mg Oral BID  . sodium chloride flush  3 mL Intravenous Q12H   Continuous Infusions: . sodium chloride       LOS: 6 days   Time spent= 35 mins    Alyssia Heese Arsenio Loader, MD [facility]  If 7PM-7AM, please contact night-coverage  06/23/2020, 12:23 PM

## 2020-06-23 NOTE — Progress Notes (Signed)
BIPAP not needed at this time. Patients does not appear to be in any respiratory distress with Sp02=93% on 1.5lpm.

## 2020-06-24 DIAGNOSIS — J9601 Acute respiratory failure with hypoxia: Secondary | ICD-10-CM | POA: Diagnosis not present

## 2020-06-24 LAB — BASIC METABOLIC PANEL
Anion gap: 8 (ref 5–15)
BUN: 24 mg/dL — ABNORMAL HIGH (ref 8–23)
CO2: 32 mmol/L (ref 22–32)
Calcium: 10 mg/dL (ref 8.9–10.3)
Chloride: 97 mmol/L — ABNORMAL LOW (ref 98–111)
Creatinine, Ser: 0.77 mg/dL (ref 0.44–1.00)
GFR, Estimated: >60 mL/min (ref >60)
Glucose, Bld: 146 mg/dL — ABNORMAL HIGH (ref 70–99)
Potassium: 4.1 mmol/L (ref 3.5–5.1)
Sodium: 137 mmol/L (ref 135–145)

## 2020-06-24 LAB — GLUCOSE, CAPILLARY
Glucose-Capillary: 132 mg/dL — ABNORMAL HIGH (ref 70–99)
Glucose-Capillary: 295 mg/dL — ABNORMAL HIGH (ref 70–99)
Glucose-Capillary: 142 mg/dL — ABNORMAL HIGH (ref 70–99)
Glucose-Capillary: 173 mg/dL — ABNORMAL HIGH (ref 70–99)

## 2020-06-24 LAB — MAGNESIUM: Magnesium: 2 mg/dL (ref 1.7–2.4)

## 2020-06-24 MED ORDER — FUROSEMIDE 10 MG/ML IJ SOLN
60.0000 mg | Freq: Two times a day (BID) | INTRAMUSCULAR | Status: DC
  Administered 2020-06-24 – 2020-06-25 (×2): 60 mg via INTRAVENOUS
  Filled 2020-06-24 (×2): qty 6

## 2020-06-24 NOTE — Progress Notes (Signed)
Heart Failure Nurse Navigator Progress Note  PCP: Merrilee Seashore, MD PCP-Cardiologist: Bertrum Sol., MD Admission Diagnosis: decompensating CHF Admitted from: home alone  Presentation:   Marissa Jefferson presented with SOB. Upon interviewing pt was finishing session with OT, sitting on side of bed. Has some issues with urinary incontinence as her bed had been soiled with urine. Pt was assisted to chair for bed change, requiring oxygen support. Pt states she does not want to have oxygen at home as she has to climb 20 outside steps to get up to her apartment and doesn't want to deal with the hastle of tanks or oxygen tubing. Pt pleasant and interactive in the interview process. Pt stated she is depressed after her neighbor who was then hired to be her personal care attendant moved away due to her husband's job. She would like to find another private personal care to help with ADLs.   Pt declined HV TOC visit as she is very happy with her care by Dr. Sallyanne Kuster; pt has follow up 5/10 with Coletta Memos, NP. Navigator did not see the need to push this appointment as pt understands general HF education and lifestyle choices and has some trouble with ambulation/transportation. Explained to patient I can enroll her in [facility] Transportation services free to her at any time she wishes, declined at this time.  ECHO/ LVEF: 55-60%, G2DD, RV severely reduced.   Clinical Course:  Past Medical History:  Diagnosis Date  . Arthritis    "knees" (10/08/2013)  . Breast cancer ([place])    "left; I had 62 sessions of radiation"  . CAD (coronary artery disease), native coronary artery 10/06/2013   LAD 90%, CFX 90%, RCA 100% w/ collat  . Chronic combined systolic and diastolic CHF, NYHA class 2 ([place]) 09/2013  . CVA (cerebral vascular accident) ([place]) 1968   leaving no deficit  . DM2 (diabetes mellitus, type 2) ([place])   . GERD (gastroesophageal reflux disease)   . Hyperlipidemia   . Hypertension   . Hypothyroidism   .  Ischemic dilated cardiomyopathy ([place]) 09/2013   EF 20-25% by echo  . NSVT (nonsustained ventricular tachycardia) ([place]) 09/2013  . OAB (overactive bladder)   . Osteopenia   . 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 Marital status: Widowed    Spouse name: Not on file  . Number of children: Not on file  . Years of education: Not on file  . Highest education level: Not on file  Occupational History  . Not on file  Tobacco Use  . Smoking status: Former Smoker    Packs/day: 0.25    Years: 3.00    Pack years: 0.75    Types: Cigarettes    Quit date: 03/12/1948    Years since quitting: 72.3  . Smokeless tobacco: Never Used  . Tobacco comment: smoked in college   Substance and Sexual Activity  . Alcohol use: No    Alcohol/week: 0.0 standard drinks  . Drug use: No  . Sexual activity: Not Currently  Other Topics Concern  . Not on file  Social History Narrative  . Not on file   Social Determinants of Health   Financial Resource Strain: Not on file  Food Insecurity: Not on file  Transportation Needs: Not on file  Physical Activity: Not on file  Stress: Not on file  Social Connections: Not on file    High Risk Criteria for Readmission and/or Poor Patient  Outcomes:  Heart failure hospital admissions (last 6 months): 1   No Show rate: 2%  Difficult social situation: no  Demonstrates medication adherence: yes  Primary Language: English  Literacy level: able to read/write and comprehend  Barriers of Care:   -transportation-offered to enroll in [employer], declined at this time. Will utilize family per pt statement.  Considerations/Referrals:   Referral made to Heart & Vascular TOC clinic: pt declined.   Pricilla Holm, RN, BSN Heart Failure Nurse Navigator [phone]

## 2020-06-24 NOTE — Progress Notes (Signed)
Physical Therapy Treatment Patient Details Name: Marissa Jefferson MRN: [id number] DOB: [birth year] Today's Date: 06/24/2020    History of Present Illness Pt is a 87 y.o. female admitted to ED from [place] office on 06/17/2020 with reports of SOB, hypoxia, and BLE edema. CXR with pulmonary edema. Workup for decompensated HF with acute on chronic hypoxic respiratory failure. PMH includes chronic diastolic CHF, HTN, HLD, CAD (s/p CABG), hypothyroidism, IIDM.   PT Comments    Pt progressing with mobility. Pt performed and gait training with rollator (which she uses at home) and intermittent min guard for balance; pt demonstrates improving activity tolerance. SpO2 94% on 2L O2 [place], HR 63. Pt hopeful for d/c home this weekend; reports having necessary support from family upon initial return home. If to remain admitted, will continue to follow acutely.   Follow Up Recommendations  Home health PT;Supervision - Intermittent     Equipment Recommendations  None recommended by PT    Recommendations for Other Services       Precautions / Restrictions Precautions Precautions: Fall;Other (comment) Precaution Comments: Watch SpO2; urine incontinence Restrictions Weight Bearing Restrictions: No    Mobility  Bed Mobility Overal bed mobility: Modified Independent Bed Mobility: Supine to Sit;Sit to Supine     Supine to sit: Modified independent (Device/Increase time);HOB elevated Sit to supine: Independent        Transfers Overall transfer level: Needs assistance Equipment used: 4-wheeled walker Transfers: Sit to/from Stand Sit to Stand: Supervision         General transfer comment: Pt asking, "Should I put my hands up here (on rollator) or down here (on bed)" - cues for hand placement and to lock rollator brakes; pt reliant on momentum to power into standing, supervision for safety  Ambulation/Gait Ambulation/Gait assistance: Min guard Gait Distance (Feet): 100 Feet Assistive device:  4-wheeled walker Gait Pattern/deviations: Step-through pattern;Decreased stride length;Trunk flexed     General Gait Details: Pt happy to be able to use rollator this session; slow, mostly steady gait with rollator and intermittent min guard for balance; pt endorses R knee fatigue with further distance; intermittent cues for activity pacing; SpO2 94% on 2L O2   Stairs             Wheelchair Mobility    Modified Rankin (Stroke Patients Only)       Balance Overall balance assessment: Needs assistance Sitting-balance support: No upper extremity supported;Feet supported Sitting balance-Leahy Scale: Good       Standing balance-Leahy Scale: Fair Standing balance comment: can static stand without UE support, reliant on UE support for ambulation                            Cognition Arousal/Alertness: Awake/alert Behavior During Therapy: WFL for tasks assessed/performed Overall Cognitive Status: No family/caregiver present to determine baseline cognitive functioning Area of Impairment: Memory;Problem solving                     Memory: Decreased short-term memory       Problem Solving: Requires verbal cues General Comments: suspect near baseline cognition      Exercises      General Comments General comments (skin integrity, edema, etc.): SpO2 94% on 2L O2, HR 63      Pertinent Vitals/Pain Pain Assessment: Faces Faces Pain Scale: Hurts a little bit Pain Location: R knee with ambulation Pain Descriptors / Indicators: Cramping Pain Intervention(s): Monitored during session    Home  Living                      Prior Function            PT Goals (current goals can now be found in the care plan section) Progress towards PT goals: Progressing toward goals    Frequency    Min 3X/week      PT Plan Current plan remains appropriate    Co-evaluation              AM-PAC PT "6 Clicks" Mobility   Outcome Measure  Help  needed turning from your back to your side while in a flat bed without using bedrails?: None Help needed moving from lying on your back to sitting on the side of a flat bed without using bedrails?: A Little Help needed moving to and from a bed to a chair (including a wheelchair)?: A Little Help needed standing up from a chair using your arms (e.g., wheelchair or bedside chair)?: A Little Help needed to walk in hospital room?: A Little Help needed climbing 3-5 steps with a railing? : A Little 6 Click Score: 19    End of Session Equipment Utilized During Treatment: Gait belt;Oxygen Activity Tolerance: Patient tolerated treatment well Patient left: in bed;with call bell/phone within reach;with bed alarm set Nurse Communication: Mobility status PT Visit Diagnosis: Other abnormalities of gait and mobility (R26.89);Muscle weakness (generalized) (M62.81)     Time: 1219-7588 PT Time Calculation (min) (ACUTE ONLY): 18 min  Charges:  $Gait Training: 8-22 mins                     Mabeline Caras, PT, DPT Acute Rehabilitation Services  Pager [phone] Office [place] 06/24/2020, 4:56 PM

## 2020-06-24 NOTE — Progress Notes (Signed)
PROGRESS NOTE    Marissa Jefferson  KXF:[id number] DOB: Oct 29, 1924 DOA: 06/17/2020 PCP: Merrilee Seashore, MD   Brief Narrative:  87 year old female with history of chronic diastolic CHF, hypertension, hyperlipidemia, CAD s/p 3v CABG, hypothyroidism, DM presented with increasing shortness of breath for last 2 weeks. Patient ported symptoms started 2 weeks ago and gradually worsening, initially exertional.  Then developed constant shortness of breath in the last 2 days, orthopnea at night.  Patient also noted her shoes do not fit. In ED, patient was found to have O2 sats in 70s, improved to 80s on 5 L O2, failed BiPAP trial.  Admitted for further work-up for acute respiratory failure, hypoxia, acute on chronic diastolic CHF.  Cardiology team is following and adjusting her diuretics as necessary.   Assessment & Plan:   Principal Problem:   Acute respiratory failure with hypoxia (HCC) Active Problems:   Essential hypertension   Hypercholesterolemia   Diabetes mellitus type 2, controlled ([place])   Coronary artery disease involving native coronary artery of native heart without angina pectoris   S/P CABG x 4   Hypothyroidism   GERD (gastroesophageal reflux disease)   Acute CHF (congestive heart failure) (HCC)   Pressure injury of skin    Acute respiratory failure with hypoxia (HCC) likely secondary to acute on chronic diastolic CHF, right heart failure. Class III -Still showing signs of volume overload. - 2DEcho: EF 55-60%, G2DD, right heart failure, large left pleural effusion, severe pulmonary hypertension -CT angiogram negative for PE -Cardiology following, home O2 evaluation done, still requiring 4 L O2 -Continue Lasix 60 mg IV twice daily.  Plans to transition to Lasix 40 mg orally daily starting tomorrow. -Cardiology following  Left pleural effusion -Improved with diuretics  Hypokalemia -Replete as needed    Essential hypertension -Coreg and Lasix     Hypercholesterolemia -Continue Lipitor 80 mg daily    Diabetes mellitus type 2, uncontrolled ([place]), not on long-term insulin with hypoglycemia -Metformin on hold.  A1c 8.2. Lantus 8 units at bedtime, sliding scale and Accu-Cheks    Coronary artery disease S/P CABG -2D echo with right heart failure, preserved EF 55 to 60% -Troponins mildly elevated due to demand ischemia in the setting of acute decompensated diastolic CHF    Hypothyroidism -Continue Synthroid, TSH 2.1    Pressure injury of skin Stage I, medial sacrum, present on admission Wound care per nursing  Generalized debility -PT evaluation recommended home health PT   DVT prophylaxis: Lovenox Code Status: Full code Family Communication:  Called and updated her daughter [name]  Status is: Inpatient  Remains inpatient appropriate because:Inpatient level of care appropriate due to severity of illness   Dispo: The patient is from: Home              Anticipated d/c is to: Home              Patient currently is not medically stable to d/c.  Patient is getting IV Lasix.  Hopefully discharge home tomorrow.   Difficult to place patient No  Body mass index is 21.21 kg/m.  Pressure Ulcer 03/22/14 Stage I -  Intact skin with non-blanchable redness of a localized area usually over a bony prominence. red, excoriated (Active)  03/22/14 0821  Location: Sacrum  Location Orientation: Mid  Staging: Stage I -  Intact skin with non-blanchable redness of a localized area usually over a bony prominence.  Wound Description (Comments): red, excoriated  Present on Admission:      Pressure Injury 06/17/20 Sacrum  Medial Stage 1 -  Intact skin with non-blanchable redness of a localized area usually over a bony prominence. (Active)  06/17/20 1927  Location: Sacrum  Location Orientation: Medial  Staging: Stage 1 -  Intact skin with non-blanchable redness of a localized area usually over a bony prominence.  Wound Description (Comments):    Present on Admission: Yes    Subjective: Still has some exertional dyspnea but slightly better compared to yesterday  Review of Systems Otherwise negative except as per HPI, including: General: Denies fever, chills, night sweats or unintended weight loss. Resp: Denies cough, wheezing, shortness of breath. Cardiac: Denies chest pain, palpitations, orthopnea, paroxysmal nocturnal dyspnea. GI: Denies abdominal pain, nausea, vomiting, diarrhea or constipation GU: Denies dysuria, frequency, hesitancy or incontinence MS: Denies muscle aches, joint pain or swelling Neuro: Denies headache, neurologic deficits (focal weakness, numbness, tingling), abnormal gait Psych: Denies anxiety, depression, SI/HI/AVH Skin: Denies new rashes or lesions ID: Denies sick contacts, exotic exposures, travel  Examination:  Constitutional: Not in acute distress, 2 L nasal cannula Respiratory: Clear to auscultation bilaterally Cardiovascular: Normal sinus rhythm, no rubs Abdomen: Nontender nondistended good bowel sounds Musculoskeletal: No edema noted Skin: No rashes seen Neurologic: CN 2-12 grossly intact.  And nonfocal Psychiatric: Normal judgment and insight. Alert and oriented x 3. Normal mood.   Objective: Vitals:   06/23/20 1702 06/23/20 1942 06/24/20 0354 06/24/20 0816  BP:  109/64 (!) 93/56 (!) 130/94  Pulse:  (!) 59 66 62  Resp:  18 18 20   Temp:  97.8 F (36.6 C) 97.6 F (36.4 C) 97.8 F (36.6 C)  TempSrc:  Oral Oral Oral  SpO2: 96% 91% 90% (!) 86%  Weight:   59.6 kg   Height:        Intake/Output Summary (Last 24 hours) at 06/24/2020 1210 Last data filed at 06/23/2020 2000 Gross per 24 hour  Intake 775 ml  Output 1600 ml  Net -825 ml   Filed Weights   06/22/20 0443 06/23/20 0434 06/24/20 0354  Weight: 63.7 kg 59.6 kg 59.6 kg     Data Reviewed:   CBC: No results for input(s): WBC, NEUTROABS, HGB, HCT, MCV, PLT in the last 168 hours. Basic Metabolic Panel: Recent Labs  Lab  06/20/20 0330 06/21/20 0141 06/22/20 0701 06/23/20 0358 06/24/20 0143  NA 135 139 138 138 137  K 3.1* 4.0 3.9 3.5 4.1  CL 95* 98 100 97* 97*  CO2 34* 31 33* 30 32  GLUCOSE 176* 152* 120* 133* 146*  BUN 15 19 18 23  24*  CREATININE 1.03* 0.92 0.76 0.90 0.77  CALCIUM 9.5 9.8 9.9 9.8 10.0  MG  --   --   --  1.8 2.0   GFR: Estimated Creatinine Clearance: 41.1 mL/min (by C-G formula based on SCr of 0.77 mg/dL). Liver Function Tests: No results for input(s): AST, ALT, ALKPHOS, BILITOT, PROT, ALBUMIN in the last 168 hours. No results for input(s): LIPASE, AMYLASE in the last 168 hours. No results for input(s): AMMONIA in the last 168 hours. Coagulation Profile: No results for input(s): INR, PROTIME in the last 168 hours. Cardiac Enzymes: No results for input(s): CKTOTAL, CKMB, CKMBINDEX, TROPONINI in the last 168 hours. BNP (last 3 results) No results for input(s): PROBNP in the last 8760 hours. HbA1C: No results for input(s): HGBA1C in the last 72 hours. CBG: Recent Labs  Lab 06/23/20 1127 06/23/20 1614 06/23/20 2153 06/24/20 0600 06/24/20 1124  GLUCAP 240* 236* 182* 132* 295*   Lipid Profile: No results  for input(s): CHOL, HDL, LDLCALC, TRIG, CHOLHDL, LDLDIRECT in the last 72 hours. Thyroid Function Tests: No results for input(s): TSH, T4TOTAL, FREET4, T3FREE, THYROIDAB in the last 72 hours. Anemia Panel: No results for input(s): VITAMINB12, FOLATE, FERRITIN, TIBC, IRON, RETICCTPCT in the last 72 hours. Sepsis Labs: No results for input(s): PROCALCITON, LATICACIDVEN in the last 168 hours.  Recent Results (from the past 240 hour(s))  Resp Panel by RT-PCR (Flu A&B, Covid) Nasopharyngeal Swab     Status: None   Collection Time: 06/17/20  2:20 PM   Specimen: Nasopharyngeal Swab; Nasopharyngeal(NP) swabs in vial transport medium  Result Value Ref Range Status   SARS Coronavirus 2 by RT PCR NEGATIVE NEGATIVE Final    Comment: (NOTE) SARS-CoV-2 target nucleic acids are NOT  DETECTED.  The SARS-CoV-2 RNA is generally detectable in upper respiratory specimens during the acute phase of infection. The lowest concentration of SARS-CoV-2 viral copies this assay can detect is 138 copies/mL. A negative result does not preclude SARS-Cov-2 infection and should not be used as the sole basis for treatment or other patient management decisions. A negative result may occur with  improper specimen collection/handling, submission of specimen other than nasopharyngeal swab, presence of viral mutation(s) within the areas targeted by this assay, and inadequate number of viral copies(<138 copies/mL). A negative result must be combined with clinical observations, patient history, and epidemiological information. The expected result is Negative.  Fact Sheet for Patients:  EntrepreneurPulse.com.au  Fact Sheet for Healthcare Providers:  IncredibleEmployment.be  This test is no t yet approved or cleared by the [place] FDA and  has been authorized for detection and/or diagnosis of SARS-CoV-2 by FDA under an Emergency Use Authorization (EUA). This EUA will remain  in effect (meaning this test can be used) for the duration of the COVID-19 declaration under Section 564(b)(1) of the Act, 21 U.S.C.section 360bbb-3(b)(1), unless the authorization is terminated  or revoked sooner.       Influenza A by PCR NEGATIVE NEGATIVE Final   Influenza B by PCR NEGATIVE NEGATIVE Final    Comment: (NOTE) The Xpert Xpress SARS-CoV-2/FLU/RSV plus assay is intended as an aid in the diagnosis of influenza from Nasopharyngeal swab specimens and should not be used as a sole basis for treatment. Nasal washings and aspirates are unacceptable for Xpert Xpress SARS-CoV-2/FLU/RSV testing.  Fact Sheet for Patients: EntrepreneurPulse.com.au  Fact Sheet for Healthcare Providers: IncredibleEmployment.be  This test is not yet  approved or cleared by the [place] FDA and has been authorized for detection and/or diagnosis of SARS-CoV-2 by FDA under an Emergency Use Authorization (EUA). This EUA will remain in effect (meaning this test can be used) for the duration of the COVID-19 declaration under Section 564(b)(1) of the Act, 21 U.S.C. section 360bbb-3(b)(1), unless the authorization is terminated or revoked.  Performed at [facility], [place] [address], [place], [place] [address]          Radiology Studies: No results found.      Scheduled Meds: . aspirin EC  81 mg Oral Daily  . atorvastatin  80 mg Oral QPM  . carvedilol  25 mg Oral BID WC  . cholecalciferol  5,000 Units Oral Daily  . darifenacin  15 mg Oral Daily  . enoxaparin (LOVENOX) injection  40 mg Subcutaneous Q24H  . furosemide  60 mg Intravenous BID  . insulin aspart  0-15 Units Subcutaneous TID WC  . insulin glargine  8 Units Subcutaneous QHS  . levothyroxine  150 mcg Oral  Daily  . magnesium oxide  400 mg Oral BID  . sodium chloride flush  3 mL Intravenous Q12H   Continuous Infusions: . sodium chloride       LOS: 7 days   Time spent= 35 mins    Khamora Karan Arsenio Loader, MD [facility]  If 7PM-7AM, please contact night-coverage  06/24/2020, 12:10 PM

## 2020-06-24 NOTE — Progress Notes (Signed)
Pt resting comfortably at this time with a RR 16 on 2L . No bipap needed at this time.

## 2020-06-24 NOTE — Evaluation (Signed)
Occupational Therapy Evaluation Patient Details Name: Marissa Jefferson MRN: [id number] DOB: June 06, 1924 Today's Date: 06/24/2020    History of Present Illness Pt is a 87 y.o. female admitted to ED from [place] office on 06/17/2020 with reports of SOB, hypoxia, and BLE edema. CXR with pulmonary edema. Workup for decompensated HF with acute on chronic hypoxic respiratory failure. PMH includes chronic diastolic CHF, HTN, HLD, CAD (s/p CABG), hypothyroidism, IIDM.   Clinical Impression   Pt was living alone with intermittent assistance for IADL. She sponge bathes and walks with a rollator. Pt presents with decreased activity tolerance. Sp02 dropped to 84% on RA, returned to 91% after 3 minutes on 2L 02. Pt requires set up to min assist for ADL. Will follow acutely to address endurance for ADL and IADL. Pt plans to discharge home with her daughter for a few days and then a "nurse for 2 weeks."     Follow Up Recommendations  Home health OT    Equipment Recommendations  None recommended by OT    Recommendations for Other Services       Precautions / Restrictions Precautions Precautions: Fall Precaution Comments: Watch SpO2      Mobility Bed Mobility               General bed mobility comments: pt seated at EOB    Transfers Overall transfer level: Needs assistance Equipment used: Rolling walker (2 wheeled) Transfers: Sit to/from Stand Sit to Stand: Supervision         General transfer comment: cues for hand placement    Balance Overall balance assessment: Needs assistance Sitting-balance support: No upper extremity supported;Feet supported Sitting balance-Leahy Scale: Good       Standing balance-Leahy Scale: Fair Standing balance comment: can static stand without UE support, reliant on RW for ambulation                           ADL either performed or assessed with clinical judgement   ADL Overall ADL's : Needs assistance/impaired Eating/Feeding:  Independent   Grooming: Supervision/safety;Standing   Upper Body Bathing: Minimal assistance;Sitting   Lower Body Bathing: Minimal assistance;Sit to/from stand   Upper Body Dressing : Set up;Sitting   Lower Body Dressing: Min guard;Sit to/from stand   Toilet Transfer: Supervision/safety;RW;Ambulation   Toileting- Water quality scientist and Hygiene: Supervision/safety;Sit to/from stand       Functional mobility during ADLs: Supervision/safety;Rolling walker       Vision         Perception     Praxis      Pertinent Vitals/Pain Pain Assessment: No/denies pain     Hand Dominance Right   Extremity/Trunk Assessment Upper Extremity Assessment Upper Extremity Assessment: Overall WFL for tasks assessed   Lower Extremity Assessment Lower Extremity Assessment: Defer to PT evaluation       Communication Communication Communication: No difficulties   Cognition Arousal/Alertness: Awake/alert Behavior During Therapy: WFL for tasks assessed/performed Overall Cognitive Status: No family/caregiver present to determine baseline cognitive functioning Area of Impairment: Memory                     Memory: Decreased short-term memory         General Comments: suspect near baseline   General Comments  Sp02 down to 84% on RA, 91% with 2L    Exercises     Shoulder Instructions      Home Living Family/patient expects to be discharged to:: Private residence Living Arrangements:  Alone Available Help at Discharge: Family;Friend(s);Available PRN/intermittently Type of Home: Apartment Home Access: Stairs to enter Entrance Stairs-Number of Steps: 20 Entrance Stairs-Rails: Left Home Layout: One level     Bathroom Shower/Tub: Teacher, early years/pre: Standard     Home Equipment: Environmental consultant - 4 wheels;Cane - single point   Additional Comments: pt sponge bathes      Prior Functioning/Environment Level of Independence: Needs assistance  Gait /  Transfers Assistance Needed: pt reports ambulating household distances independently with use of Rollator, pt requires assistance to negotiate stairs ADL's / Homemaking Assistance Needed: pt performs sponge baths, requires assistance for IADLs            OT Problem List: Decreased activity tolerance;Impaired balance (sitting and/or standing);Cardiopulmonary status limiting activity      OT Treatment/Interventions: Self-care/ADL training;DME and/or AE instruction;Energy conservation;Patient/family education;Therapeutic activities    OT Goals(Current goals can be found in the care plan section) Acute Rehab OT Goals Patient Stated Goal: to improve activity tolerance and return home OT Goal Formulation: With patient Time For Goal Achievement: 07/08/20 Potential to Achieve Goals: Good ADL Goals Pt Will Perform Grooming: with modified independence;standing (3 activities) Pt Will Perform Lower Body Bathing: with modified independence;sit to/from stand Pt Will Perform Lower Body Dressing: with modified independence;sit to/from stand Pt Will Transfer to Toilet: with modified independence;ambulating Pt Will Perform Toileting - Clothing Manipulation and hygiene: with modified independence;sit to/from stand Additional ADL Goal #1: Pt will generalize energy conservation strategies in ADL and mobility independently.  OT Frequency: Min 2X/week   Barriers to D/C:            Co-evaluation              AM-PAC OT "6 Clicks" Daily Activity     Outcome Measure Help from another person eating meals?: None Help from another person taking care of personal grooming?: A Little Help from another person toileting, which includes using toliet, bedpan, or urinal?: A Little Help from another person bathing (including washing, rinsing, drying)?: A Little Help from another person to put on and taking off regular upper body clothing?: None Help from another person to put on and taking off regular lower  body clothing?: A Little 6 Click Score: 20   End of Session Equipment Utilized During Treatment: Rolling walker;Oxygen (2L)  Activity Tolerance: Patient tolerated treatment well Patient left: in chair;with call bell/phone within reach;with chair alarm set;with nursing/sitter in room  OT Visit Diagnosis: Unsteadiness on feet (R26.81);Other abnormalities of gait and mobility (R26.89);Muscle weakness (generalized) (M62.81);Other (comment) (decreased activity tolerance)                Time: 4765-4650 OT Time Calculation (min): 19 min Charges:  OT General Charges $OT Visit: 1 Visit OT Evaluation $OT Eval Moderate Complexity: 1 Mod  Nestor Lewandowsky, OTR/L Acute Rehabilitation Services Pager: [phone] Office: [phone]  Malka So 06/24/2020, 12:41 PM

## 2020-06-24 NOTE — Care Management Important Message (Signed)
Important Message  Patient Details  Name: Marissa Jefferson MRN: [id number] Date of Birth: 1924-11-06   Medicare Important Message Given:  Yes - Important Message mailed due to current National Emergency   Verbal consent obtained due to current National Emergency  Relationship to patient: Self Contact Name: Sarit Call Date: 06/24/20  Time: 1116 Phone: [phone] Outcome: Spoke with contact Important Message mailed to: Patient address on file    Delorse Lek 06/24/2020, 11:17 AM

## 2020-06-24 NOTE — Progress Notes (Addendum)
Progress Note  Patient Name: Marissa Jefferson Date of Encounter: 06/24/2020  Primary Cardiologist: Sanda Klein, MD   Subjective   Had desaturation with PT.  Doing well at rest.  Eager to go home 4/16 so long as it is a safe DC.  Inpatient Medications    Scheduled Meds: . aspirin EC  81 mg Oral Daily  . atorvastatin  80 mg Oral QPM  . carvedilol  25 mg Oral BID WC  . cholecalciferol  5,000 Units Oral Daily  . darifenacin  15 mg Oral Daily  . enoxaparin (LOVENOX) injection  40 mg Subcutaneous Q24H  . furosemide  40 mg Intravenous BID  . insulin aspart  0-15 Units Subcutaneous TID WC  . insulin glargine  8 Units Subcutaneous QHS  . levothyroxine  150 mcg Oral Daily  . magnesium oxide  400 mg Oral BID  . sodium chloride flush  3 mL Intravenous Q12H   Continuous Infusions: . sodium chloride     PRN Meds: sodium chloride, acetaminophen, diclofenac Sodium, docusate sodium, hydrALAZINE, lip balm, ondansetron (ZOFRAN) IV, sodium chloride flush   Vital Signs    Vitals:   06/23/20 1702 06/23/20 1942 06/24/20 0354 06/24/20 0816  BP:  109/64 (!) 93/56 (!) 130/94  Pulse:  (!) 59 66 62  Resp:  18 18 20   Temp:  97.8 F (36.6 C) 97.6 F (36.4 C) 97.8 F (36.6 C)  TempSrc:  Oral Oral Oral  SpO2: 96% 91% 90% (!) 86%  Weight:   59.6 kg   Height:        Intake/Output Summary (Last 24 hours) at 06/24/2020 1104 Last data filed at 06/23/2020 2000 Gross per 24 hour  Intake 775 ml  Output 1600 ml  Net -825 ml   Filed Weights   06/22/20 0443 06/23/20 0434 06/24/20 0354  Weight: 63.7 kg 59.6 kg 59.6 kg    Telemetry    SR - Personally Reviewed  ECG    No new last 24 - Personally Reviewed  Physical Exam   GEN: No acute distress.   Neck: JVD improved Cardiac: RRR, no rubs, or gallops; holosystolic murmur stable Respiratory: Improved crackles no WOB GI: Soft, nontender, non-distended  MS: No edema; No deformity. Neuro:  Nonfocal  Psych: Normal affect   Labs     Chemistry Recent Labs  Lab 06/22/20 0701 06/23/20 0358 06/24/20 0143  NA 138 138 137  K 3.9 3.5 4.1  CL 100 97* 97*  CO2 33* 30 32  GLUCOSE 120* 133* 146*  BUN 18 23 24*  CREATININE 0.76 0.90 0.77  CALCIUM 9.9 9.8 10.0  GFRNONAA >60 60* >60  ANIONGAP 5 11 8      Hematology Recent Labs  Lab 06/17/20 1126  WBC 9.3  RBC 5.12*  HGB 17.4*  HCT 53.4*  MCV 104.3*  MCH 34.0  MCHC 32.6  RDW 13.8  PLT 209    Cardiac EnzymesNo results for input(s): TROPONINI in the last 168 hours. No results for input(s): TROPIPOC in the last 168 hours.   BNP Recent Labs  Lab 06/17/20 1127 06/19/20 0155 06/22/20 0701  BNP 892.4* 700.5* 546.5*     DDimer  Recent Labs  Lab 06/19/20 1049  DDIMER 0.87*     Radiology    No results found.  Cardiac Studies    1. Left ventricular ejection fraction, by estimation, is 55 to 60%. The  left ventricle has normal function. The left ventricle has no regional  wall motion abnormalities. There is moderate  left ventricular hypertrophy.  Left ventricular diastolic  parameters are consistent with Grade II diastolic dysfunction  (pseudonormalization). Elevated left ventricular end-diastolic pressure.  There is the interventricular septum is flattened in diastole ('D' shaped  left ventricle), consistent with right  ventricular volume overload and the interventricular septum is flattened  in systole, consistent with right ventricular pressure overload.   2. Right ventricular systolic function is severely reduced. The right  ventricular size is mildly enlarged. There is severely elevated pulmonary  artery systolic pressure. The estimated right ventricular systolic  pressure is 02.5 mmHg.   3. Left atrial size was mildly dilated.   4. Large pleural effusion in the left lateral region.   5. The mitral valve is grossly normal. Mild mitral valve regurgitation.   6. Tricuspid valve regurgitation is moderate.   7. The NCC and the LCC are fused. .  The aortic valve is tricuspid. Aortic  valve regurgitation is not visualized. Mild aortic valve stenosis.   8. The inferior vena cava is dilated in size with <50% respiratory  variability, suggesting right atrial pressure of 15 mmHg.   Patient Profile     87 y.o. female with CAD s/p CV CABG, HTN with DM, HLD with DM, HFpEF with subacute RV Failure and PH, Mild AS who presented with hypoxic respiratory failure  Assessment & Plan    Heart Failure presevered Ejection Fraction- decompensated with acute on chronic hypoxic respiratory failure Pulmonary Hypertension (Unclear etiology) CAD s/p Prior CABG Hypertension with DM HLD with DM - NYHA class II, Stage C, near euvolemic, etiology from CAD suspected - asymptomatic from CAD; anatomy LIMA-LAD, SVG-OM1, SVG-OM2, SVG-PD -- 2015 - Diuretic regimen: Lasix 60 mg IV BID for today balancing CrCL and volume status- discussed with primary MD: PO lasix dose will be 40 mg PO daily - Discussed the importance of fluid restriction of < 2 L, salt restriction, and checking daily weights  - Replace electrolytes PRN and keep K>4 and Mg>2. - Coreg 25 mg PO BID - Possibly to start SGLT2i Trial - continue ASA, Statin 80 mg - will attempt to reach out to daughter in PM if able; per patient request - patient is peridischarge- working on safe follow up  For questions or updates, please contact [facility] Please consult [email or web address] for contact info under Cardiology/STEMI.      Signed, Werner Lean, MD  06/24/2020, 11:04 AM

## 2020-06-25 DIAGNOSIS — J9601 Acute respiratory failure with hypoxia: Secondary | ICD-10-CM | POA: Diagnosis not present

## 2020-06-25 LAB — GLUCOSE, CAPILLARY
Glucose-Capillary: 170 mg/dL — ABNORMAL HIGH (ref 70–99)
Glucose-Capillary: 271 mg/dL — ABNORMAL HIGH (ref 70–99)
Glucose-Capillary: 228 mg/dL — ABNORMAL HIGH (ref 70–99)
Glucose-Capillary: 213 mg/dL — ABNORMAL HIGH (ref 70–99)

## 2020-06-25 LAB — BASIC METABOLIC PANEL
Anion gap: 7 (ref 5–15)
BUN: 25 mg/dL — ABNORMAL HIGH (ref 8–23)
CO2: 35 mmol/L — ABNORMAL HIGH (ref 22–32)
Calcium: 10.1 mg/dL (ref 8.9–10.3)
Chloride: 94 mmol/L — ABNORMAL LOW (ref 98–111)
Creatinine, Ser: 0.82 mg/dL (ref 0.44–1.00)
GFR, Estimated: >60 mL/min (ref >60)
Glucose, Bld: 147 mg/dL — ABNORMAL HIGH (ref 70–99)
Potassium: 4.1 mmol/L (ref 3.5–5.1)
Sodium: 136 mmol/L (ref 135–145)

## 2020-06-25 LAB — MAGNESIUM: Magnesium: 2 mg/dL (ref 1.7–2.4)

## 2020-06-25 MED ORDER — FUROSEMIDE 10 MG/ML IJ SOLN
80.0000 mg | Freq: Two times a day (BID) | INTRAMUSCULAR | Status: DC
  Administered 2020-06-25: 80 mg via INTRAVENOUS
  Filled 2020-06-25: qty 8

## 2020-06-25 NOTE — Discharge Instructions (Signed)
Heart-Healthy Eating Plan Heart-healthy meal planning includes:  Eating less unhealthy fats.  Eating more healthy fats.  Making other changes in your diet. Talk with your doctor or a diet specialist (dietitian) to create an eating plan that is right for you. What is my plan? Your doctor may recommend an eating plan that includes:  Total fat: ______% or less of total calories a day.  Saturated fat: ______% or less of total calories a day.  Cholesterol: less than _________mg a day. What are tips for following this plan? Cooking Avoid frying your food. Try to bake, boil, grill, or broil it instead. You can also reduce fat by:  Removing the skin from poultry.  Removing all visible fats from meats.  Steaming vegetables in water or broth. Meal planning  At meals, divide your plate into four equal parts: ? Fill one-half of your plate with vegetables and green salads. ? Fill one-fourth of your plate with whole grains. ? Fill one-fourth of your plate with lean protein foods.  Eat 4-5 servings of vegetables per day. A serving of vegetables is: ? 1 cup of raw or cooked vegetables. ? 2 cups of raw leafy greens.  Eat 4-5 servings of fruit per day. A serving of fruit is: ? 1 medium whole fruit. ?  cup of dried fruit. ?  cup of fresh, frozen, or canned fruit. ?  cup of 100% fruit juice.  Eat more foods that have soluble fiber. These are apples, broccoli, carrots, beans, peas, and barley. Try to get 20-30 g of fiber per day.  Eat 4-5 servings of nuts, legumes, and seeds per week: ? 1 serving of dried beans or legumes equals  cup after being cooked. ? 1 serving of nuts is  cup. ? 1 serving of seeds equals 1 tablespoon.   General information  Eat more home-cooked food. Eat less restaurant, buffet, and fast food.  Limit or avoid alcohol.  Limit foods that are high in starch and sugar.  Avoid fried foods.  Lose weight if you are overweight.  Keep track of how much salt  (sodium) you eat. This is important if you have high blood pressure. Ask your doctor to tell you more about this.  Try to add vegetarian meals each week. Fats  Choose healthy fats. These include olive oil and canola oil, flaxseeds, walnuts, almonds, and seeds.  Eat more omega-3 fats. These include salmon, mackerel, sardines, tuna, flaxseed oil, and ground flaxseeds. Try to eat fish at least 2 times each week.  Check food labels. Avoid foods with trans fats or high amounts of saturated fat.  Limit saturated fats. ? These are often found in animal products, such as meats, butter, and cream. ? These are also found in plant foods, such as palm oil, palm kernel oil, and coconut oil.  Avoid foods with partially hydrogenated oils in them. These have trans fats. Examples are stick margarine, some tub margarines, cookies, crackers, and other baked goods. What foods can I eat? Fruits All fresh, canned (in natural juice), or frozen fruits. Vegetables Fresh or frozen vegetables (raw, steamed, roasted, or grilled). Green salads. Grains Most grains. Choose whole wheat and whole grains most of the time. Rice and pasta, including brown rice and pastas made with whole wheat. Meats and other proteins Lean, well-trimmed beef, veal, pork, and lamb. Chicken and turkey without skin. All fish and shellfish. Wild duck, rabbit, pheasant, and venison. Egg whites or low-cholesterol egg substitutes. Dried beans, peas, lentils, and tofu. Seeds and   most nuts. Dairy Low-fat or nonfat cheeses, including ricotta and mozzarella. Skim or 1% milk that is liquid, powdered, or evaporated. Buttermilk that is made with low-fat milk. Nonfat or low-fat yogurt. Fats and oils Non-hydrogenated (trans-free) margarines. Vegetable oils, including soybean, sesame, sunflower, olive, peanut, safflower, corn, canola, and cottonseed. Salad dressings or mayonnaise made with a vegetable oil. Beverages Mineral water. Coffee and tea. Diet  carbonated beverages. Sweets and desserts Sherbet, gelatin, and fruit ice. Small amounts of dark chocolate. Limit all sweets and desserts. Seasonings and condiments All seasonings and condiments. The items listed above may not be a complete list of foods and drinks you can eat. Contact a dietitian for more options. What foods should I avoid? Fruits Canned fruit in heavy syrup. Fruit in cream or butter sauce. Fried fruit. Limit coconut. Vegetables Vegetables cooked in cheese, cream, or butter sauce. Fried vegetables. Grains Breads that are made with saturated or trans fats, oils, or whole milk. Croissants. Sweet rolls. Donuts. High-fat crackers, such as cheese crackers. Meats and other proteins Fatty meats, such as hot dogs, ribs, sausage, bacon, rib-eye roast or steak. High-fat deli meats, such as salami and bologna. Caviar. Domestic duck and goose. Organ meats, such as liver. Dairy Cream, sour cream, cream cheese, and creamed cottage cheese. Whole-milk cheeses. Whole or 2% milk that is liquid, evaporated, or condensed. Whole buttermilk. Cream sauce or high-fat cheese sauce. Yogurt that is made from whole milk. Fats and oils Meat fat, or shortening. Cocoa butter, hydrogenated oils, palm oil, coconut oil, palm kernel oil. Solid fats and shortenings, including bacon fat, salt pork, lard, and butter. Nondairy cream substitutes. Salad dressings with cheese or sour cream. Beverages Regular sodas and juice drinks with added sugar. Sweets and desserts Frosting. Pudding. Cookies. Cakes. Pies. Milk chocolate or white chocolate. Buttered syrups. Full-fat ice cream or ice cream drinks. The items listed above may not be a complete list of foods and drinks to avoid. Contact a dietitian for more information. Summary  Heart-healthy meal planning includes eating less unhealthy fats, eating more healthy fats, and making other changes in your diet.  Eat a balanced diet. This includes fruits and  vegetables, low-fat or nonfat dairy, lean protein, nuts and legumes, whole grains, and heart-healthy oils and fats. This information is not intended to replace advice given to you by your health care provider. Make sure you discuss any questions you have with your health care provider. Document Revised: 05/02/2017 Document Reviewed: 04/05/2017 Elsevier Patient Education  2021 [employer]   1. Follow a low salt diet which means you need to consume less than 2 grams (2000mg ) of sodium per day.  Check the labels!  You'll be surprised to see how much sodium is in common foods and drinks.  DO NOT EAT foods high in salt, such as salted nuts, crackers, smoked fish, fast food, deli meats, salami, pepperoni, jerky, prosciutto, ham, bacon, soups, prepared frozen dinners.  DO NOT add salt to your food when cooking or at the table.  It is ok to use other seasonings and flavors as long as they are salt free.   2. Drink less than 8 cups (which is ~2032ml or 2 liters) of fluid per day.  This is equivalent to ~ 4 bottles of water a day. 3. Weigh yourself every morning before breakfast and write it down in a log.  Bring in your record to every clinic visit.  Check for swelling in your feet, ankles, legs and  stomach every morning.  Take an extra dose of your fluid pill once a day for worsening shortness of breath, swelling, or >3lb weight gain in 24 hours or >5lb weight gain in 1 week. 4. Control your blood pressure.  If you have a blood pressure cuff, record your blood pressure & heart rate daily. Bring the record to [facility] clinic with you.  Goal BP is <130/80 5. Control your blood sugars if you are diabetic 6. Control your cholesterol.  The goal LDL (bad cholesterol) is <70 7. Lose weight if you are overweight 8. Exercise as much as you are able to strengthen your heart 9. Take your medicine the way you are instructed. 10.  Bring ALL your medicines and medication list to [place] clinic. 11.  Get a yearly flu shot to reduce your risk of the flu 12.  If you are under age 63 you need the Pneumovax pneumonia vaccine as a one time shot to reduce the risk of pneumonia 13.  If you are 42 yrs or older you need the Prevnar pneumonia vaccine as a one time shot followed by a Pneumovax pneumonia vaccine > 1 year later as a one time shot to help reduce your risk of pneumonia   Call nurse if you have trouble paying for your medications, are running low on pills, having trouble with transportation or if you are gaining weight quickly, having significant swelling, trouble breathing, chest pain, dizziness or fainting spells.

## 2020-06-25 NOTE — Progress Notes (Signed)
PROGRESS NOTE    Marissa Jefferson  WNU:[id number] DOB: 02/21/1925 DOA: 06/17/2020 PCP: Merrilee Seashore, MD   Brief Narrative:  87 year old female with history of chronic diastolic CHF, hypertension, hyperlipidemia, CAD s/p 3v CABG, hypothyroidism, DM presented with increasing shortness of breath for last 2 weeks. Patient ported symptoms started 2 weeks ago and gradually worsening, initially exertional.  Then developed constant shortness of breath in the last 2 days, orthopnea at night.  Patient also noted her shoes do not fit. In ED, patient was found to have O2 sats in 70s, improved to 80s on 5 L O2, failed BiPAP trial.  Admitted for further work-up for acute respiratory failure, hypoxia, acute on chronic diastolic CHF.  Cardiology team is following and adjusting her diuretics as necessary.   Assessment & Plan:   Principal Problem:   Acute respiratory failure with hypoxia (HCC) Active Problems:   Essential hypertension   Hypercholesterolemia   Diabetes mellitus type 2, controlled ([place])   Coronary artery disease involving native coronary artery of native heart without angina pectoris   S/P CABG x 4   Hypothyroidism   GERD (gastroesophageal reflux disease)   Acute CHF (congestive heart failure) (HCC)   Pressure injury of skin    Acute respiratory failure with hypoxia (HCC) likely secondary to acute on chronic diastolic CHF, right heart failure. Class III -Still showing signs of volume overload. Still 87% on Room Air.  - 2DEcho: EF 55-60%, G2DD, right heart failure, large left pleural effusion, severe pulmonary hypertension -CT angiogram negative for PE -Cardiology following, home O2 evaluation done, still requiring 4 L O2 -Increase Lasix 80 mg IV twice daily.  Plans to transition to Lasix 40 mg orally daily starting tomorrow. -Cardiology following  Left pleural effusion -Improved with diuretics  Hypokalemia -Replete as needed    Essential hypertension -Coreg and Lasix     Hypercholesterolemia -Continue Lipitor 80 mg daily    Diabetes mellitus type 2, uncontrolled ([place]), not on long-term insulin with hypoglycemia -Metformin on hold.  A1c 8.2. Lantus 8 units at bedtime, sliding scale and Accu-Cheks    Coronary artery disease S/P CABG -2D echo with right heart failure, preserved EF 55 to 60% -Troponins mildly elevated due to demand ischemia in the setting of acute decompensated diastolic CHF    Hypothyroidism -Continue Synthroid, TSH 2.1    Pressure injury of skin Stage I, medial sacrum, present on admission Wound care per nursing  Generalized debility -PT evaluation recommended home health PT   DVT prophylaxis: Lovenox Code Status: Full code Family Communication:  Daughter [name] updated.   Status is: Inpatient  Remains inpatient appropriate because:Inpatient level of care appropriate due to severity of illness   Dispo: The patient is from: Home              Anticipated d/c is to: Home              Patient currently is not medically stable to d/c.  Patient is getting IV Lasix.  Hopefully discharge home tomorrow.   Difficult to place patient No  Body mass index is 20.92 kg/m.  Pressure Ulcer 03/22/14 Stage I -  Intact skin with non-blanchable redness of a localized area usually over a bony prominence. red, excoriated (Active)  03/22/14 0821  Location: Sacrum  Location Orientation: Mid  Staging: Stage I -  Intact skin with non-blanchable redness of a localized area usually over a bony prominence.  Wound Description (Comments): red, excoriated  Present on Admission:  Pressure Injury 06/17/20 Sacrum Medial Stage 1 -  Intact skin with non-blanchable redness of a localized area usually over a bony prominence. (Active)  06/17/20 1927  Location: Sacrum  Location Orientation: Medial  Staging: Stage 1 -  Intact skin with non-blanchable redness of a localized area usually over a bony prominence.  Wound Description (Comments):   Present on  Admission: Yes    Subjective: Destauration down to 87% on RA at rest. At rest feels ok, still on 2L [place]. No Chest pain.   Review of Systems Otherwise negative except as per HPI, including: General = no fevers, chills, dizziness,  fatigue HEENT/EYES = negative for loss of vision, double vision, blurred vision,  sore throa Cardiovascular= negative for chest pain, palpitation Respiratory/lungs= negative for shortness of breath, cough, wheezing; hemoptysis,  Gastrointestinal= negative for nausea, vomiting, abdominal pain Genitourinary= negative for Dysuria MSK = Negative for arthralgia, myalgias Neurology= Negative for headache, numbness, tingling  Psychiatry= Negative for suicidal and homocidal ideation Skin= Negative for Rash   Examination:  Constitutional: Not in acute distress Respiratory: some bibasilar crackles.  Cardiovascular: Normal sinus rhythm, no rubs Abdomen: Nontender nondistended good bowel sounds Musculoskeletal: No edema noted Skin: No rashes seen Neurologic: CN 2-12 grossly intact.  And nonfocal Psychiatric: Normal judgment and insight. Alert and oriented x 3. Normal mood.     Objective: Vitals:   06/24/20 1120 06/24/20 1940 06/25/20 0323 06/25/20 0835  BP:  128/70 119/72 (!) 115/59  Pulse:  (!) 57 (!) 53 62  Resp:  18 16   Temp:  97.8 F (36.6 C) 97.9 F (36.6 C) 98.4 F (36.9 C)  TempSrc:  Oral Oral Oral  SpO2: 94% 92% 91%   Weight:   58.8 kg   Height:        Intake/Output Summary (Last 24 hours) at 06/25/2020 1042 Last data filed at 06/25/2020 0800 Gross per 24 hour  Intake 580 ml  Output 2650 ml  Net -2070 ml   Filed Weights   06/23/20 0434 06/24/20 0354 06/25/20 0323  Weight: 59.6 kg 59.6 kg 58.8 kg     Data Reviewed:   CBC: No results for input(s): WBC, NEUTROABS, HGB, HCT, MCV, PLT in the last 168 hours. Basic Metabolic Panel: Recent Labs  Lab 06/21/20 0141 06/22/20 0701 06/23/20 0358 06/24/20 0143 06/25/20 0213  NA 139 138  138 137 136  K 4.0 3.9 3.5 4.1 4.1  CL 98 100 97* 97* 94*  CO2 31 33* 30 32 35*  GLUCOSE 152* 120* 133* 146* 147*  BUN 19 18 23  24* 25*  CREATININE 0.92 0.76 0.90 0.77 0.82  CALCIUM 9.8 9.9 9.8 10.0 10.1  MG  --   --  1.8 2.0 2.0   GFR: Estimated Creatinine Clearance: 39.8 mL/min (by C-G formula based on SCr of 0.82 mg/dL). Liver Function Tests: No results for input(s): AST, ALT, ALKPHOS, BILITOT, PROT, ALBUMIN in the last 168 hours. No results for input(s): LIPASE, AMYLASE in the last 168 hours. No results for input(s): AMMONIA in the last 168 hours. Coagulation Profile: No results for input(s): INR, PROTIME in the last 168 hours. Cardiac Enzymes: No results for input(s): CKTOTAL, CKMB, CKMBINDEX, TROPONINI in the last 168 hours. BNP (last 3 results) No results for input(s): PROBNP in the last 8760 hours. HbA1C: No results for input(s): HGBA1C in the last 72 hours. CBG: Recent Labs  Lab 06/24/20 0600 06/24/20 1124 06/24/20 1613 06/24/20 2054 06/25/20 0546  GLUCAP 132* 295* 142* 173* 170*   Lipid  Profile: No results for input(s): CHOL, HDL, LDLCALC, TRIG, CHOLHDL, LDLDIRECT in the last 72 hours. Thyroid Function Tests: No results for input(s): TSH, T4TOTAL, FREET4, T3FREE, THYROIDAB in the last 72 hours. Anemia Panel: No results for input(s): VITAMINB12, FOLATE, FERRITIN, TIBC, IRON, RETICCTPCT in the last 72 hours. Sepsis Labs: No results for input(s): PROCALCITON, LATICACIDVEN in the last 168 hours.  Recent Results (from the past 240 hour(s))  Resp Panel by RT-PCR (Flu A&B, Covid) Nasopharyngeal Swab     Status: None   Collection Time: 06/17/20  2:20 PM   Specimen: Nasopharyngeal Swab; Nasopharyngeal(NP) swabs in vial transport medium  Result Value Ref Range Status   SARS Coronavirus 2 by RT PCR NEGATIVE NEGATIVE Final    Comment: (NOTE) SARS-CoV-2 target nucleic acids are NOT DETECTED.  The SARS-CoV-2 RNA is generally detectable in upper respiratory specimens  during the acute phase of infection. The lowest concentration of SARS-CoV-2 viral copies this assay can detect is 138 copies/mL. A negative result does not preclude SARS-Cov-2 infection and should not be used as the sole basis for treatment or other patient management decisions. A negative result may occur with  improper specimen collection/handling, submission of specimen other than nasopharyngeal swab, presence of viral mutation(s) within the areas targeted by this assay, and inadequate number of viral copies(<138 copies/mL). A negative result must be combined with clinical observations, patient history, and epidemiological information. The expected result is Negative.  Fact Sheet for Patients:  EntrepreneurPulse.com.au  Fact Sheet for Healthcare Providers:  IncredibleEmployment.be  This test is no t yet approved or cleared by the [place] FDA and  has been authorized for detection and/or diagnosis of SARS-CoV-2 by FDA under an Emergency Use Authorization (EUA). This EUA will remain  in effect (meaning this test can be used) for the duration of the COVID-19 declaration under Section 564(b)(1) of the Act, 21 U.S.C.section 360bbb-3(b)(1), unless the authorization is terminated  or revoked sooner.       Influenza A by PCR NEGATIVE NEGATIVE Final   Influenza B by PCR NEGATIVE NEGATIVE Final    Comment: (NOTE) The Xpert Xpress SARS-CoV-2/FLU/RSV plus assay is intended as an aid in the diagnosis of influenza from Nasopharyngeal swab specimens and should not be used as a sole basis for treatment. Nasal washings and aspirates are unacceptable for Xpert Xpress SARS-CoV-2/FLU/RSV testing.  Fact Sheet for Patients: EntrepreneurPulse.com.au  Fact Sheet for Healthcare Providers: IncredibleEmployment.be  This test is not yet approved or cleared by the [place] FDA and has been authorized for detection  and/or diagnosis of SARS-CoV-2 by FDA under an Emergency Use Authorization (EUA). This EUA will remain in effect (meaning this test can be used) for the duration of the COVID-19 declaration under Section 564(b)(1) of the Act, 21 U.S.C. section 360bbb-3(b)(1), unless the authorization is terminated or revoked.  Performed at [facility], [place] [address] [place], [place] [address]          Radiology Studies: No results found.      Scheduled Meds: . aspirin EC  81 mg Oral Daily  . atorvastatin  80 mg Oral QPM  . carvedilol  25 mg Oral BID WC  . cholecalciferol  5,000 Units Oral Daily  . darifenacin  15 mg Oral Daily  . enoxaparin (LOVENOX) injection  40 mg Subcutaneous Q24H  . furosemide  80 mg Intravenous BID  . insulin aspart  0-15 Units Subcutaneous TID WC  . insulin glargine  8 Units Subcutaneous QHS  . levothyroxine  150 mcg Oral Daily  . magnesium oxide  400 mg Oral BID  . sodium chloride flush  3 mL Intravenous Q12H   Continuous Infusions: . sodium chloride       LOS: 8 days   Time spent= 35 mins    Lorelie Biermann Arsenio Loader, MD [facility]  If 7PM-7AM, please contact night-coverage  06/25/2020, 10:42 AM

## 2020-06-25 NOTE — Progress Notes (Signed)
Progress Note  Patient Name: Marissa Jefferson Date of Encounter: 06/25/2020  Primary Cardiologist: Sanda Klein, MD   Subjective   Resting sat lower today that 06/24/20.  Patient discusses the physical steps that she will need to make to get home.  Happy to be up with nursing but wishes she had even more PT time.  Inpatient Medications    Scheduled Meds: . aspirin EC  81 mg Oral Daily  . atorvastatin  80 mg Oral QPM  . carvedilol  25 mg Oral BID WC  . cholecalciferol  5,000 Units Oral Daily  . darifenacin  15 mg Oral Daily  . enoxaparin (LOVENOX) injection  40 mg Subcutaneous Q24H  . furosemide  60 mg Intravenous BID  . insulin aspart  0-15 Units Subcutaneous TID WC  . insulin glargine  8 Units Subcutaneous QHS  . levothyroxine  150 mcg Oral Daily  . magnesium oxide  400 mg Oral BID  . sodium chloride flush  3 mL Intravenous Q12H   Continuous Infusions: . sodium chloride     PRN Meds: sodium chloride, acetaminophen, diclofenac Sodium, docusate sodium, hydrALAZINE, lip balm, ondansetron (ZOFRAN) IV, sodium chloride flush   Vital Signs    Vitals:   06/24/20 1120 06/24/20 1940 06/25/20 0323 06/25/20 0835  BP:  128/70 119/72 (!) 115/59  Pulse:  (!) 57 (!) 53 62  Resp:  18 16   Temp:  97.8 F (36.6 C) 97.9 F (36.6 C) 98.4 F (36.9 C)  TempSrc:  Oral Oral Oral  SpO2: 94% 92% 91%   Weight:   58.8 kg   Height:        Intake/Output Summary (Last 24 hours) at 06/25/2020 1000 Last data filed at 06/25/2020 0800 Gross per 24 hour  Intake 580 ml  Output 2650 ml  Net -2070 ml   Filed Weights   06/23/20 0434 06/24/20 0354 06/25/20 0323  Weight: 59.6 kg 59.6 kg 58.8 kg    Telemetry    SR - Personally Reviewed  ECG    No new last 24 - Personally Reviewed  Physical Exam   GEN: No acute distress.   Cardiac: RRR, no rubs, or gallops; holosystolic murmur stable II/VI Respiratory: Improved WOB, poor effor but decreased crackles GI: Soft, nontender, non-distended  MS:  No edema; No deformity. Neuro:  Nonfocal  Psych: Normal affect   Resting Sat 87%  Labs    Chemistry Recent Labs  Lab 06/23/20 0358 06/24/20 0143 06/25/20 0213  NA 138 137 136  K 3.5 4.1 4.1  CL 97* 97* 94*  CO2 30 32 35*  GLUCOSE 133* 146* 147*  BUN 23 24* 25*  CREATININE 0.90 0.77 0.82  CALCIUM 9.8 10.0 10.1  GFRNONAA 60* >60 >60  ANIONGAP 11 8 7      Hematology No results for input(s): WBC, RBC, HGB, HCT, MCV, MCH, MCHC, RDW, PLT in the last 168 hours.  Cardiac EnzymesNo results for input(s): TROPONINI in the last 168 hours. No results for input(s): TROPIPOC in the last 168 hours.   BNP Recent Labs  Lab 06/19/20 0155 06/22/20 0701  BNP 700.5* 546.5*     DDimer  Recent Labs  Lab 06/19/20 1049  DDIMER 0.87*     Radiology    No results found.  Cardiac Studies    1. Left ventricular ejection fraction, by estimation, is 55 to 60%. The  left ventricle has normal function. The left ventricle has no regional  wall motion abnormalities. There is moderate left ventricular hypertrophy.  Left ventricular diastolic  parameters are consistent with Grade II diastolic dysfunction  (pseudonormalization). Elevated left ventricular end-diastolic pressure.  There is the interventricular septum is flattened in diastole ('D' shaped  left ventricle), consistent with right  ventricular volume overload and the interventricular septum is flattened  in systole, consistent with right ventricular pressure overload.   2. Right ventricular systolic function is severely reduced. The right  ventricular size is mildly enlarged. There is severely elevated pulmonary  artery systolic pressure. The estimated right ventricular systolic  pressure is 77.4 mmHg.   3. Left atrial size was mildly dilated.   4. Large pleural effusion in the left lateral region.   5. The mitral valve is grossly normal. Mild mitral valve regurgitation.   6. Tricuspid valve regurgitation is moderate.   7. The  NCC and the LCC are fused. . The aortic valve is tricuspid. Aortic  valve regurgitation is not visualized. Mild aortic valve stenosis.   8. The inferior vena cava is dilated in size with <50% respiratory  variability, suggesting right atrial pressure of 15 mmHg.   Patient Profile     87 y.o. female with CAD s/p CV CABG, HTN with DM, HLD with DM, HFpEF with subacute RV Failure and PH, Mild AS who presented with hypoxic respiratory failure  Assessment & Plan    Heart Failure presevered Ejection Fraction- decompensated with acute on chronic hypoxic respiratory failure Pulmonary Hypertension NOS CAD s/p Prior CABG Hypertension with DM HLD with DM - NYHA class II, Stage C, near euvolemic, etiology from CAD suspected - asymptomatic from CAD; anatomy LIMA-LAD, SVG-OM1, SVG-OM2, SVG-PD -- 2015 - Diuretic regimen: Lasix 80 mg IV BID for today balancing CrCL and volume status- discussed with primary MD: PO lasix dose will be 40 mg PO daily (lasix naive prior) - Discussed the importance of fluid restriction of < 2 L, salt restriction, and checking daily weights  - Replace electrolytes PRN and keep K>4 and Mg>2. - Coreg 25 mg PO BID - SGLT2i Trial was considered through care - continue ASA, Statin 80 mg - discussed at length with patient and primary MD  Has 07/19/20 Follow up  For questions or updates, please contact [place] Please consult [email or web address] for contact info under Cardiology/STEMI.      Signed, Werner Lean, MD  06/25/2020, 10:00 AM

## 2020-06-25 NOTE — Progress Notes (Signed)
SATURATION QUALIFICATIONS: (This note is used to comply with regulatory documentation for home oxygen)  Patient Saturations on Room Air at Rest = 86  Patient Saturations on Room Air while Ambulating = 82  Patient Saturations on 2 Liters of oxygen while Ambulating =92  Please briefly explain why patient needs home oxygen: 

## 2020-06-26 DIAGNOSIS — J9601 Acute respiratory failure with hypoxia: Secondary | ICD-10-CM | POA: Diagnosis not present

## 2020-06-26 LAB — GLUCOSE, CAPILLARY
Glucose-Capillary: 193 mg/dL — ABNORMAL HIGH (ref 70–99)
Glucose-Capillary: 258 mg/dL — ABNORMAL HIGH (ref 70–99)
Glucose-Capillary: 136 mg/dL — ABNORMAL HIGH (ref 70–99)
Glucose-Capillary: 305 mg/dL — ABNORMAL HIGH (ref 70–99)

## 2020-06-26 LAB — MAGNESIUM: Magnesium: 2 mg/dL (ref 1.7–2.4)

## 2020-06-26 LAB — BASIC METABOLIC PANEL
Anion gap: 12 (ref 5–15)
BUN: 35 mg/dL — ABNORMAL HIGH (ref 8–23)
CO2: 31 mmol/L (ref 22–32)
Calcium: 10.2 mg/dL (ref 8.9–10.3)
Chloride: 94 mmol/L — ABNORMAL LOW (ref 98–111)
Creatinine, Ser: 0.85 mg/dL (ref 0.44–1.00)
GFR, Estimated: >60 mL/min (ref >60)
Glucose, Bld: 150 mg/dL — ABNORMAL HIGH (ref 70–99)
Potassium: 3.6 mmol/L (ref 3.5–5.1)
Sodium: 137 mmol/L (ref 135–145)

## 2020-06-26 MED ORDER — POTASSIUM CHLORIDE CRYS ER 20 MEQ PO TBCR
40.0000 meq | EXTENDED_RELEASE_TABLET | Freq: Once | ORAL | Status: AC
  Administered 2020-06-26: 40 meq via ORAL
  Filled 2020-06-26: qty 2

## 2020-06-26 MED ORDER — FUROSEMIDE 10 MG/ML IJ SOLN
80.0000 mg | Freq: Two times a day (BID) | INTRAMUSCULAR | Status: DC
  Administered 2020-06-26 – 2020-06-27 (×3): 80 mg via INTRAVENOUS
  Filled 2020-06-26 (×3): qty 8

## 2020-06-26 NOTE — Progress Notes (Signed)
Progress Note  Patient Name: Marissa Jefferson Date of Encounter: 06/26/2020  Primary Cardiologist: Sanda Klein, MD   Subjective   Continues to improve; being able to walk to the nurses station today.  Needed O2 with exertion (sat 87%).  Inpatient Medications    Scheduled Meds: . aspirin EC  81 mg Oral Daily  . atorvastatin  80 mg Oral QPM  . carvedilol  25 mg Oral BID WC  . cholecalciferol  5,000 Units Oral Daily  . darifenacin  15 mg Oral Daily  . enoxaparin (LOVENOX) injection  40 mg Subcutaneous Q24H  . furosemide  80 mg Intravenous BID  . insulin aspart  0-15 Units Subcutaneous TID WC  . insulin glargine  8 Units Subcutaneous QHS  . levothyroxine  150 mcg Oral Daily  . magnesium oxide  400 mg Oral BID  . sodium chloride flush  3 mL Intravenous Q12H   Continuous Infusions: . sodium chloride     PRN Meds: sodium chloride, acetaminophen, diclofenac Sodium, docusate sodium, hydrALAZINE, lip balm, ondansetron (ZOFRAN) IV, sodium chloride flush   Vital Signs    Vitals:   06/25/20 1934 06/26/20 0409 06/26/20 0849 06/26/20 1157  BP: 117/81 (!) 141/81 125/71 102/61  Pulse: 61 (!) 59 (!) 53 (!) 58  Resp: 18 16 18 18   Temp: 97.8 F (36.6 C) 98 F (36.7 C) 97.8 F (36.6 C) 97.7 F (36.5 C)  TempSrc: Oral Oral Oral Oral  SpO2: 94% 93% 90% 98%  Weight:  59 kg    Height:        Intake/Output Summary (Last 24 hours) at 06/26/2020 1311 Last data filed at 06/26/2020 0807 Gross per 24 hour  Intake 780 ml  Output 825 ml  Net -45 ml   Filed Weights   06/24/20 0354 06/25/20 0323 06/26/20 0409  Weight: 59.6 kg 58.8 kg 59 kg    Telemetry    Sinus bradycardia to SR - Personally Reviewed  ECG    No new last 24 - Personally Reviewed  Physical Exam   GEN: No acute distress.   Cardiac: RRR, no rubs, or gallops; holosystolic murmur stable II/VI Respiratory: Improved work of breathing, good effort GI: Soft, nontender, non-distended  MS: No edema; No deformity. Neuro:   Nonfocal  Psych: Normal affect   Resting Sat 94% this PM  Labs    Chemistry Recent Labs  Lab 06/24/20 0143 06/25/20 0213 06/26/20 0253  NA 137 136 137  K 4.1 4.1 3.6  CL 97* 94* 94*  CO2 32 35* 31  GLUCOSE 146* 147* 150*  BUN 24* 25* 35*  CREATININE 0.77 0.82 0.85  CALCIUM 10.0 10.1 10.2  GFRNONAA >60 >60 >60  ANIONGAP 8 7 12      Hematology No results for input(s): WBC, RBC, HGB, HCT, MCV, MCH, MCHC, RDW, PLT in the last 168 hours.  Cardiac EnzymesNo results for input(s): TROPONINI in the last 168 hours. No results for input(s): TROPIPOC in the last 168 hours.   BNP Recent Labs  Lab 06/22/20 0701  BNP 546.5*     DDimer  No results for input(s): DDIMER in the last 168 hours.   Radiology    No results found.  Cardiac Studies    1. Left ventricular ejection fraction, by estimation, is 55 to 60%. The  left ventricle has normal function. The left ventricle has no regional  wall motion abnormalities. There is moderate left ventricular hypertrophy.  Left ventricular diastolic  parameters are consistent with Grade II diastolic dysfunction  (  pseudonormalization). Elevated left ventricular end-diastolic pressure.  There is the interventricular septum is flattened in diastole ('D' shaped  left ventricle), consistent with right  ventricular volume overload and the interventricular septum is flattened  in systole, consistent with right ventricular pressure overload.   2. Right ventricular systolic function is severely reduced. The right  ventricular size is mildly enlarged. There is severely elevated pulmonary  artery systolic pressure. The estimated right ventricular systolic  pressure is 53.7 mmHg.   3. Left atrial size was mildly dilated.   4. Large pleural effusion in the left lateral region.   5. The mitral valve is grossly normal. Mild mitral valve regurgitation.   6. Tricuspid valve regurgitation is moderate.   7. The NCC and the LCC are fused. . The aortic  valve is tricuspid. Aortic  valve regurgitation is not visualized. Mild aortic valve stenosis.   8. The inferior vena cava is dilated in size with <50% respiratory  variability, suggesting right atrial pressure of 15 mmHg.   Patient Profile     87 y.o. female with CAD s/p CV CABG, HTN with DM, HLD with DM, HFpEF with subacute RV Failure and PH, Mild AS who presented with hypoxic respiratory failure  Assessment & Plan    Heart Failure presevered Ejection Fraction- decompensated with acute on chronic hypoxic respiratory failure Pulmonary Hypertension NOS CAD s/p Prior CABG Hypertension with DM HLD with DM - NYHA class II, Stage C, near euvolemic, etiology from CAD suspected - asymptomatic from CAD; anatomy LIMA-LAD, SVG-OM1, SVG-OM2, SVG-PD -- 2015 - Diuretic regimen: Lasix 80 mg IV BID again today; home regimen likely 40 mg PO BID - Discussed the importance of fluid restriction of < 2 L, salt restriction, and checking daily weights  - Replace electrolytes PRN and keep K>4 and Mg>2. - Coreg 25 mg PO BID - SGLT2i Trial was considered through care - continue ASA, Statin 80 mg - discussed with patient and primary MD  Has 07/19/20 Follow up  For questions or updates, please contact [place] Please consult [email or web address] for contact info under Cardiology/STEMI.      Signed, Werner Lean, MD  06/26/2020, 1:11 PM

## 2020-06-26 NOTE — Progress Notes (Signed)
SATURATION QUALIFICATIONS: (This note is used to comply with regulatory documentation for home oxygen)  Patient Saturations on Room Air at Rest = 92%  Patient Saturations on Room Air while Ambulating = 87%  Patient Saturations on 2 Liters of oxygen while Ambulating = >91%  Please briefly explain why patient needs home oxygen: pt requires home O2 to maintain appropriate oxygen saturations during functional mobility tasks.   Corinne Ports K., COTA/L Acute Rehabilitation Services [phone]

## 2020-06-26 NOTE — Progress Notes (Signed)
PROGRESS NOTE    Marissa Jefferson  WUJ:[id number] DOB: [birth year] DOA: 06/17/2020 PCP: Merrilee Seashore, MD   Brief Narrative:  87 year old female with history of chronic diastolic CHF, hypertension, hyperlipidemia, CAD s/p 3v CABG, hypothyroidism, DM presented with increasing shortness of breath for last 2 weeks. Patient ported symptoms started 2 weeks ago and gradually worsening, initially exertional.  Then developed constant shortness of breath in the last 2 days, orthopnea at night.  Patient also noted her shoes do not fit. In ED, patient was found to have O2 sats in 70s, improved to 80s on 5 L O2, failed BiPAP trial.  Admitted for further work-up for acute respiratory failure, hypoxia, acute on chronic diastolic CHF.  Cardiology team is following and adjusting her diuretics as necessary.  Assessment & Plan:   Principal Problem:   Acute respiratory failure with hypoxia (HCC) Active Problems:   Essential hypertension   Hypercholesterolemia   Diabetes mellitus type 2, controlled ([place])   Coronary artery disease involving native coronary artery of native heart without angina pectoris   S/P CABG x 4   Hypothyroidism   GERD (gastroesophageal reflux disease)   Acute CHF (congestive heart failure) (HCC)   Pressure injury of skin    Acute respiratory failure with hypoxia (HCC) likely secondary to acute on chronic diastolic CHF, right heart failure. Class III -Still showing signs of volume overload. Still 87% on Room Air.  - 2DEcho: EF 55-60%, G2DD, right heart failure, large left pleural effusion, severe pulmonary hypertension -CT angiogram negative for PE -Cardiology following, home O2 evaluation done, still requiring 2-3 L O2 -Cont Lasix IV BID, until hypoxia is better.  -Cardiology following  Left pleural effusion -Improved with diuretics  Hypokalemia -Replete as needed    Essential hypertension -Coreg and Lasix    Hypercholesterolemia -Continue Lipitor 80 mg daily     Diabetes mellitus type 2, uncontrolled ([place]), not on long-term insulin with hypoglycemia -Metformin on hold.  A1c 8.2. Lantus 8 units at bedtime, sliding scale and Accu-Cheks    Coronary artery disease S/P CABG -2D echo with right heart failure, preserved EF 55 to 60% -Troponins mildly elevated due to demand ischemia in the setting of acute decompensated diastolic CHF    Hypothyroidism -Continue Synthroid, TSH 2.1    Pressure injury of skin Stage I, medial sacrum, present on admission Wound care per nursing  Generalized debility -PT evaluation recommended home health PT  Advised RN to obtain ambulatory pulse ox and document in the chart.    DVT prophylaxis: Lovenox Code Status: Full code Family Communication:  Daughter [name] periodically.   Status is: Inpatient  Remains inpatient appropriate because:Inpatient level of care appropriate due to severity of illness   Dispo: The patient is from: Home              Anticipated d/c is to: Home              Patient currently is not medically stable to d/c.  Patient is getting IV Lasix.     Difficult to place patient No  Body mass index is 21 kg/m.  Pressure Ulcer 03/22/14 Stage I -  Intact skin with non-blanchable redness of a localized area usually over a bony prominence. red, excoriated (Active)  03/22/14 0821  Location: Sacrum  Location Orientation: Mid  Staging: Stage I -  Intact skin with non-blanchable redness of a localized area usually over a bony prominence.  Wound Description (Comments): red, excoriated  Present on Admission:  Pressure Injury 06/17/20 Sacrum Medial Stage 1 -  Intact skin with non-blanchable redness of a localized area usually over a bony prominence. (Active)  06/17/20 1927  Location: Sacrum  Location Orientation: Medial  Staging: Stage 1 -  Intact skin with non-blanchable redness of a localized area usually over a bony prominence.  Wound Description (Comments):   Present on Admission: Yes     Subjective: At rest feels ok, still on o2.  Denies CP and SOB at rest.   Review of Systems Otherwise negative except as per HPI, including: General = no fevers, chills, dizziness,  fatigue HEENT/EYES = negative for loss of vision, double vision, blurred vision,  sore throa Cardiovascular= negative for chest pain, palpitation Respiratory/lungs= negative for shortness of breath, cough, wheezing; hemoptysis,  Gastrointestinal= negative for nausea, vomiting, abdominal pain Genitourinary= negative for Dysuria MSK = Negative for arthralgia, myalgias Neurology= Negative for headache, numbness, tingling  Psychiatry= Negative for suicidal and homocidal ideation Skin= Negative for Rash   Examination: Constitutional: Not in acute distress, 3L [place]. Elderly frail  Respiratory: bibasilar crackles.  Cardiovascular: Normal sinus rhythm, no rubs Abdomen: Nontender nondistended good bowel sounds Musculoskeletal: No edema noted Skin: No rashes seen Neurologic: CN 2-12 grossly intact.  And nonfocal Psychiatric: Normal judgment and insight. Alert and oriented x 3. Normal mood.    Objective: Vitals:   06/25/20 1838 06/25/20 1934 06/26/20 0409 06/26/20 0849  BP: (!) 107/53 117/81 (!) 141/81 125/71  Pulse: 62 61 (!) 59 (!) 53  Resp:  18 16 18   Temp:  97.8 F (36.6 C) 98 F (36.7 C) 97.8 F (36.6 C)  TempSrc:  Oral Oral Oral  SpO2: 93% 94% 93% 90%  Weight:   59 kg   Height:        Intake/Output Summary (Last 24 hours) at 06/26/2020 1058 Last data filed at 06/26/2020 0807 Gross per 24 hour  Intake 780 ml  Output 825 ml  Net -45 ml   Filed Weights   06/24/20 0354 06/25/20 0323 06/26/20 0409  Weight: 59.6 kg 58.8 kg 59 kg     Data Reviewed:   CBC: No results for input(s): WBC, NEUTROABS, HGB, HCT, MCV, PLT in the last 168 hours. Basic Metabolic Panel: Recent Labs  Lab 06/22/20 0701 06/23/20 0358 06/24/20 0143 06/25/20 0213 06/26/20 0253  NA 138 138 137 136 137  K 3.9 3.5  4.1 4.1 3.6  CL 100 97* 97* 94* 94*  CO2 33* 30 32 35* 31  GLUCOSE 120* 133* 146* 147* 150*  BUN 18 23 24* 25* 35*  CREATININE 0.76 0.90 0.77 0.82 0.85  CALCIUM 9.9 9.8 10.0 10.1 10.2  MG  --  1.8 2.0 2.0 2.0   GFR: Estimated Creatinine Clearance: 38.5 mL/min (by C-G formula based on SCr of 0.85 mg/dL). Liver Function Tests: No results for input(s): AST, ALT, ALKPHOS, BILITOT, PROT, ALBUMIN in the last 168 hours. No results for input(s): LIPASE, AMYLASE in the last 168 hours. No results for input(s): AMMONIA in the last 168 hours. Coagulation Profile: No results for input(s): INR, PROTIME in the last 168 hours. Cardiac Enzymes: No results for input(s): CKTOTAL, CKMB, CKMBINDEX, TROPONINI in the last 168 hours. BNP (last 3 results) No results for input(s): PROBNP in the last 8760 hours. HbA1C: No results for input(s): HGBA1C in the last 72 hours. CBG: Recent Labs  Lab 06/25/20 0546 06/25/20 1149 06/25/20 1635 06/25/20 2113 06/26/20 0512  GLUCAP 170* 271* 228* 213* 193*   Lipid Profile: No results for  input(s): CHOL, HDL, LDLCALC, TRIG, CHOLHDL, LDLDIRECT in the last 72 hours. Thyroid Function Tests: No results for input(s): TSH, T4TOTAL, FREET4, T3FREE, THYROIDAB in the last 72 hours. Anemia Panel: No results for input(s): VITAMINB12, FOLATE, FERRITIN, TIBC, IRON, RETICCTPCT in the last 72 hours. Sepsis Labs: No results for input(s): PROCALCITON, LATICACIDVEN in the last 168 hours.  Recent Results (from the past 240 hour(s))  Resp Panel by RT-PCR (Flu A&B, Covid) Nasopharyngeal Swab     Status: None   Collection Time: 06/17/20  2:20 PM   Specimen: Nasopharyngeal Swab; Nasopharyngeal(NP) swabs in vial transport medium  Result Value Ref Range Status   SARS Coronavirus 2 by RT PCR NEGATIVE NEGATIVE Final    Comment: (NOTE) SARS-CoV-2 target nucleic acids are NOT DETECTED.  The SARS-CoV-2 RNA is generally detectable in upper respiratory specimens during the acute phase  of infection. The lowest concentration of SARS-CoV-2 viral copies this assay can detect is 138 copies/mL. A negative result does not preclude SARS-Cov-2 infection and should not be used as the sole basis for treatment or other patient management decisions. A negative result may occur with  improper specimen collection/handling, submission of specimen other than nasopharyngeal swab, presence of viral mutation(s) within the areas targeted by this assay, and inadequate number of viral copies(<138 copies/mL). A negative result must be combined with clinical observations, patient history, and epidemiological information. The expected result is Negative.  Fact Sheet for Patients:  EntrepreneurPulse.com.au  Fact Sheet for Healthcare Providers:  IncredibleEmployment.be  This test is no t yet approved or cleared by the [place] FDA and  has been authorized for detection and/or diagnosis of SARS-CoV-2 by FDA under an Emergency Use Authorization (EUA). This EUA will remain  in effect (meaning this test can be used) for the duration of the COVID-19 declaration under Section 564(b)(1) of the Act, 21 U.S.C.section 360bbb-3(b)(1), unless the authorization is terminated  or revoked sooner.       Influenza A by PCR NEGATIVE NEGATIVE Final   Influenza B by PCR NEGATIVE NEGATIVE Final    Comment: (NOTE) The Xpert Xpress SARS-CoV-2/FLU/RSV plus assay is intended as an aid in the diagnosis of influenza from Nasopharyngeal swab specimens and should not be used as a sole basis for treatment. Nasal washings and aspirates are unacceptable for Xpert Xpress SARS-CoV-2/FLU/RSV testing.  Fact Sheet for Patients: EntrepreneurPulse.com.au  Fact Sheet for Healthcare Providers: IncredibleEmployment.be  This test is not yet approved or cleared by the [place] FDA and has been authorized for detection and/or diagnosis of  SARS-CoV-2 by FDA under an Emergency Use Authorization (EUA). This EUA will remain in effect (meaning this test can be used) for the duration of the COVID-19 declaration under Section 564(b)(1) of the Act, 21 U.S.C. section 360bbb-3(b)(1), unless the authorization is terminated or revoked.  Performed at [facility], [place] [address], [place],  [address]          Radiology Studies: No results found.      Scheduled Meds: . aspirin EC  81 mg Oral Daily  . atorvastatin  80 mg Oral QPM  . carvedilol  25 mg Oral BID WC  . cholecalciferol  5,000 Units Oral Daily  . darifenacin  15 mg Oral Daily  . enoxaparin (LOVENOX) injection  40 mg Subcutaneous Q24H  . furosemide  80 mg Intravenous BID  . insulin aspart  0-15 Units Subcutaneous TID WC  . insulin glargine  8 Units Subcutaneous QHS  . levothyroxine  150 mcg Oral Daily  .  magnesium oxide  400 mg Oral BID  . potassium chloride  40 mEq Oral Once  . sodium chloride flush  3 mL Intravenous Q12H   Continuous Infusions: . sodium chloride       LOS: 9 days   Time spent= 35 mins    Trenna Kiely Arsenio Loader, MD [facility]  If 7PM-7AM, please contact night-coverage  06/26/2020, 10:58 AM

## 2020-06-26 NOTE — Plan of Care (Signed)
  Problem: Activity: Goal: Risk for activity intolerance will decrease Outcome: Completed/Met   Problem: Nutrition: Goal: Adequate nutrition will be maintained Outcome: Completed/Met   Problem: Pain Managment: Goal: General experience of comfort will improve Outcome: Completed/Met   

## 2020-06-26 NOTE — Progress Notes (Signed)
Occupational Therapy Treatment Patient Details Name: Azalee Weimer MRN: [id number] DOB: 1924-03-26 Today's Date: 06/26/2020    History of present illness Pt is a 87 y.o. female admitted to ED from [place] office on 06/17/2020 with reports of SOB, hypoxia, and BLE edema. CXR with pulmonary edema. Workup for decompensated HF with acute on chronic hypoxic respiratory failure. PMH includes chronic diastolic CHF, HTN, HLD, CAD (s/p CABG), hypothyroidism, IIDM.   OT comments  Pt making steady progress towards OT goals this session. Pt continues to present with decreased activity tolerance, generalized weakness and decreased ability to care for self. Pt currently requires MIN guard assist for functional mobility greater than a household distance with RW, set-up for UB ADLs and min guard for LB ADLs from EOB. Pt needs 2L for ambulation as pt drop to 87% on RA during ambulation( see previous O2 qualification note). Pt would continue to benefit from skilled occupational therapy while admitted and after d/c to address the below listed limitations in order to improve overall functional mobility and facilitate independence with BADL participation. DC plan remains appropriate, will follow acutely per POC.     Follow Up Recommendations  Home health OT    Equipment Recommendations  None recommended by OT    Recommendations for Other Services      Precautions / Restrictions Precautions Precautions: Fall;Other (comment) Precaution Comments: Watch SpO2; urine incontinence Restrictions Weight Bearing Restrictions: No       Mobility Bed Mobility Overal bed mobility: Modified Independent Bed Mobility: Supine to Sit     Supine to sit: Modified independent (Device/Increase time);HOB elevated     General bed mobility comments: increased effort noted, no physical assist needed    Transfers Overall transfer level: Needs assistance Equipment used: Rolling walker (2 wheeled) Transfers: Sit to/from  Stand Sit to Stand: Min guard;Min assist         General transfer comment: pt attempted sit<>stand from EOB without assist but unable to power into full stand without MIN A from EOB, pt initially progressing to MIN guard assist for initial steadying assist.    Balance Overall balance assessment: Needs assistance Sitting-balance support: No upper extremity supported;Feet supported Sitting balance-Leahy Scale: Good Sitting balance - Comments: sitting EOB for LB dressing with no UE support   Standing balance support: Bilateral upper extremity supported;During functional activity   Standing balance comment: can static stand without UE support, reliant on UE support for ambulation                           ADL either performed or assessed with clinical judgement   ADL Overall ADL's : Needs assistance/impaired                 Upper Body Dressing : Set up;Sitting Upper Body Dressing Details (indicate cue type and reason): to don posterior back side cover Lower Body Dressing: Min guard;Sit to/from stand Lower Body Dressing Details (indicate cue type and reason): to don brief from EOB,light minguard for steadying assist upon initial stand Toilet Transfer: Min Marine scientist Details (indicate cue type and reason): simulated via functional mobility with rW and MIN Guard assist       Tub/Shower Transfer Details (indicate cue type and reason): pt takes sponge baths at home Functional mobility during ADLs: Min guard;Rolling walker General ADL Comments: pt completed functional mobilty greater than ahousehold distance with RW, LB dressing and provided education on energy conservation strategies for home as well as education  pertaining to managing HF     Vision       Perception     Praxis      Cognition Arousal/Alertness: Awake/alert Behavior During Therapy: WFL for tasks assessed/performed Overall Cognitive Status: No family/caregiver present to  determine baseline cognitive functioning Area of Impairment: Memory;Problem solving                     Memory: Decreased short-term memory       Problem Solving: Requires verbal cues General Comments: pt repeated same questions and asks COTA who she is after stepping out briefly, cues needed for problem solving task        Exercises     Shoulder Instructions       General Comments trialed ambulation on RA with SpO2 dropping to 87%, pt requires 2L for SpO2> 91%.education provided on ECS for home and managing HF    Pertinent Vitals/ Pain       Pain Assessment: No/denies pain  Home Living                                          Prior Functioning/Environment              Frequency  Min 2X/week        Progress Toward Goals  OT Goals(current goals can now be found in the care plan section)  Progress towards OT goals: Progressing toward goals  Acute Rehab OT Goals Patient Stated Goal: to go home OT Goal Formulation: With patient Time For Goal Achievement: 07/08/20 Potential to Achieve Goals: Good  Plan Discharge plan remains appropriate;Frequency remains appropriate    Co-evaluation                 AM-PAC OT "6 Clicks" Daily Activity     Outcome Measure   Help from another person eating meals?: None Help from another person taking care of personal grooming?: None Help from another person toileting, which includes using toliet, bedpan, or urinal?: A Little Help from another person bathing (including washing, rinsing, drying)?: A Little Help from another person to put on and taking off regular upper body clothing?: None Help from another person to put on and taking off regular lower body clothing?: None 6 Click Score: 22    End of Session Equipment Utilized During Treatment: Gait belt;Rolling walker;Oxygen;Other (comment) (2l [place])  OT Visit Diagnosis: Unsteadiness on feet (R26.81);Other abnormalities of gait and mobility  (R26.89);Muscle weakness (generalized) (M62.81);Other (comment) (decreased activity tolerance)   Activity Tolerance Patient tolerated treatment well   Patient Left in chair;with call bell/phone within reach;with chair alarm set;with nursing/sitter in room   Nurse Communication Mobility status        Time: 3220-2542 OT Time Calculation (min): 44 min  Charges: OT General Charges $OT Visit: 1 Visit OT Treatments $Self Care/Home Management : 38-52 mins  Jenisa Monty., COTA/L Acute Rehabilitation Services [phone]    Shiana Rappleye 06/26/2020, 11:38 AM

## 2020-06-27 ENCOUNTER — Other Ambulatory Visit (HOSPITAL_COMMUNITY): Payer: Self-pay

## 2020-06-27 DIAGNOSIS — R531 Weakness: Secondary | ICD-10-CM | POA: Diagnosis not present

## 2020-06-27 DIAGNOSIS — Z743 Need for continuous supervision: Secondary | ICD-10-CM | POA: Diagnosis not present

## 2020-06-27 DIAGNOSIS — R918 Other nonspecific abnormal finding of lung field: Secondary | ICD-10-CM | POA: Diagnosis not present

## 2020-06-27 DIAGNOSIS — R279 Unspecified lack of coordination: Secondary | ICD-10-CM | POA: Diagnosis not present

## 2020-06-27 DIAGNOSIS — I509 Heart failure, unspecified: Secondary | ICD-10-CM | POA: Diagnosis not present

## 2020-06-27 DIAGNOSIS — J9601 Acute respiratory failure with hypoxia: Secondary | ICD-10-CM | POA: Diagnosis not present

## 2020-06-27 DIAGNOSIS — J9600 Acute respiratory failure, unspecified whether with hypoxia or hypercapnia: Secondary | ICD-10-CM | POA: Diagnosis not present

## 2020-06-27 DIAGNOSIS — R0902 Hypoxemia: Secondary | ICD-10-CM | POA: Diagnosis not present

## 2020-06-27 LAB — GLUCOSE, CAPILLARY
Glucose-Capillary: 180 mg/dL — ABNORMAL HIGH (ref 70–99)
Glucose-Capillary: 219 mg/dL — ABNORMAL HIGH (ref 70–99)
Glucose-Capillary: 187 mg/dL — ABNORMAL HIGH (ref 70–99)
Glucose-Capillary: 168 mg/dL — ABNORMAL HIGH (ref 70–99)

## 2020-06-27 LAB — CBC
HCT: 50.8 % — ABNORMAL HIGH (ref 36.0–46.0)
Hemoglobin: 16.4 g/dL — ABNORMAL HIGH (ref 12.0–15.0)
MCH: 33.7 pg (ref 26.0–34.0)
MCHC: 32.3 g/dL (ref 30.0–36.0)
MCV: 104.5 fL — ABNORMAL HIGH (ref 80.0–100.0)
Platelets: 204 10*3/uL (ref 150–400)
RBC: 4.86 MIL/uL (ref 3.87–5.11)
RDW: 12.7 % (ref 11.5–15.5)
WBC: 8.5 10*3/uL (ref 4.0–10.5)
nRBC: 0 % (ref 0.0–0.2)

## 2020-06-27 LAB — BASIC METABOLIC PANEL
Anion gap: 11 (ref 5–15)
BUN: 35 mg/dL — ABNORMAL HIGH (ref 8–23)
CO2: 32 mmol/L (ref 22–32)
Calcium: 10 mg/dL (ref 8.9–10.3)
Chloride: 95 mmol/L — ABNORMAL LOW (ref 98–111)
Creatinine, Ser: 0.88 mg/dL (ref 0.44–1.00)
GFR, Estimated: >60 mL/min (ref >60)
Glucose, Bld: 187 mg/dL — ABNORMAL HIGH (ref 70–99)
Potassium: 3.9 mmol/L (ref 3.5–5.1)
Sodium: 138 mmol/L (ref 135–145)

## 2020-06-27 LAB — MAGNESIUM: Magnesium: 2.2 mg/dL (ref 1.7–2.4)

## 2020-06-27 MED ORDER — POTASSIUM CHLORIDE CRYS ER 10 MEQ PO TBCR
10.0000 meq | EXTENDED_RELEASE_TABLET | Freq: Every day | ORAL | 0 refills | Status: AC
  Filled 2020-06-27: qty 30, 30d supply, fill #0

## 2020-06-27 MED ORDER — FUROSEMIDE 40 MG PO TABS
40.0000 mg | ORAL_TABLET | Freq: Two times a day (BID) | ORAL | 0 refills | Status: DC
  Filled 2020-06-27: qty 60, 30d supply, fill #0

## 2020-06-27 NOTE — Plan of Care (Signed)

## 2020-06-27 NOTE — Progress Notes (Signed)
   06/27/20 1101  Mobility  Activity Refused mobility (Declined d/t getting DC)

## 2020-06-27 NOTE — Care Management Important Message (Signed)
Important Message  Patient Details  Name: Marissa Jefferson MRN: [id number] Date of Birth: 08/09/1924   Medicare Important Message Given:  Yes     [name] 06/27/2020, 10:10 AM

## 2020-06-27 NOTE — Progress Notes (Signed)
SATURATION QUALIFICATIONS: (This note is used to comply with regulatory documentation for home oxygen)  Patient Saturations on Room Air while Ambulating = not tested due to 87% on 2L O2  with ambulation  Patient Saturations on 2 Liters of oxygen while Ambulating = 87%  Please briefly explain why patient needs home oxygen: Patient requires supplemental oxygen to maintain adequate SpO2   Mabeline Caras, PT, DPT Acute Rehabilitation Services  Pager [phone] Office [phone]

## 2020-06-27 NOTE — Plan of Care (Signed)
  Problem: Activity: Goal: Capacity to carry out activities will improve Outcome: Progressing   Problem: Cardiac: Goal: Ability to achieve and maintain adequate cardiopulmonary perfusion will improve Outcome: Progressing   Problem: Education: Goal: Knowledge of General Education information will improve Description: Including pain rating scale, medication(s)/side effects and non-pharmacologic comfort measures Outcome: Progressing   Problem: Health Behavior/Discharge Planning: Goal: Ability to manage health-related needs will improve Outcome: Progressing   Problem: Clinical Measurements: Goal: Ability to maintain clinical measurements within normal limits will improve Outcome: Progressing Goal: Diagnostic test results will improve Outcome: Progressing Goal: Respiratory complications will improve Outcome: Progressing Goal: Cardiovascular complication will be avoided Outcome: Progressing

## 2020-06-27 NOTE — Plan of Care (Signed)
  Problem: Clinical Measurements: Goal: Will remain free from infection Outcome: Completed/Met   Problem: Coping: Goal: Level of anxiety will decrease Outcome: Completed/Met   Problem: Elimination: Goal: Will not experience complications related to urinary retention Outcome: Completed/Met

## 2020-06-27 NOTE — Progress Notes (Signed)
Physical Therapy Treatment Patient Details Name: Marissa Jefferson MRN: [id number] DOB: 02-14-1925 Today's Date: 06/27/2020    History of Present Illness Pt is a 87 y.o. female admitted to ED from [place] office on 06/17/2020 with reports of SOB, hypoxia, and BLE edema. CXR with pulmonary edema. Workup for decompensated HF with acute on chronic hypoxic respiratory failure. PMH includes chronic diastolic CHF, HTN, HLD, CAD (s/p CABG), hypothyroidism, IIDM.   PT Comments    Pt progressing with mobility; tolerated additional transfer and gait training with rollator and intermittent min guard for stability. Pt requires 2L O2 [place] to maintain SpO2 >/87% with activity (see saturations qualifications notes). Pt hopeful for d/c home soon; does not look forward to having to d/c with home O2. If to remain admitted, will continue to follow acutely.    Follow Up Recommendations  Home health PT;Supervision for mobility/OOB     Equipment Recommendations  None recommended by PT    Recommendations for Other Services       Precautions / Restrictions Precautions Precautions: Fall;Other (comment) Precaution Comments: Watch SpO2; urine incontinence Restrictions Weight Bearing Restrictions: No    Mobility  Bed Mobility Overal bed mobility: Modified Independent Bed Mobility: Supine to Sit                Transfers Overall transfer level: Needs assistance Equipment used: 4-wheeled walker Transfers: Sit to/from Stand Sit to Stand: Supervision         General transfer comment: Pt initially attempting to stand with BUE support on rollator, unable to achieve upright and able to correct self to pushing from EOB (without verbal cues); then able to stand with supervision, increased time and effort  Ambulation/Gait Ambulation/Gait assistance: Min guard Gait Distance (Feet): 120 Feet Assistive device: 4-wheeled walker Gait Pattern/deviations: Step-through pattern;Decreased stride length;Trunk  flexed Gait velocity: Decreased   General Gait Details: Slow, fatigued gait with rollator and intermittent min guard for balance; 2x standing rest breaks and pt 'stretching' to achieve fully upright posture, verbal cues to maintain upright with ambulation, though pt with increasing forward flexed posture with increased distance. SpO2 >/87% on 2L O2 [place]   Stairs             Wheelchair Mobility    Modified Rankin (Stroke Patients Only)       Balance Overall balance assessment: Needs assistance Sitting-balance support: No upper extremity supported;Feet supported Sitting balance-Leahy Scale: Good     Standing balance support: Bilateral upper extremity supported;During functional activity;No upper extremity supported Standing balance-Leahy Scale: Fair Standing balance comment: can static stand without UE support, reliant on UE support for ambulation                            Cognition Arousal/Alertness: Awake/alert Behavior During Therapy: WFL for tasks assessed/performed Overall Cognitive Status: No family/caregiver present to determine baseline cognitive functioning Area of Impairment: Memory;Problem solving                     Memory: Decreased short-term memory       Problem Solving: Requires verbal cues General Comments: Pt repeating self throughout session; cues for problem solving. Suspect      Exercises      General Comments        Pertinent Vitals/Pain Pain Assessment: No/denies pain Pain Intervention(s): Monitored during session    Home Living  Prior Function            PT Goals (current goals can now be found in the care plan section) Progress towards PT goals: Progressing toward goals    Frequency    Min 3X/week      PT Plan Current plan remains appropriate    Co-evaluation              AM-PAC PT "6 Clicks" Mobility   Outcome Measure  Help needed turning from your back to  your side while in a flat bed without using bedrails?: None Help needed moving from lying on your back to sitting on the side of a flat bed without using bedrails?: A Little Help needed moving to and from a bed to a chair (including a wheelchair)?: A Little Help needed standing up from a chair using your arms (e.g., wheelchair or bedside chair)?: A Little Help needed to walk in hospital room?: A Little Help needed climbing 3-5 steps with a railing? : A Little 6 Click Score: 19    End of Session Equipment Utilized During Treatment: Gait belt;Oxygen Activity Tolerance: Patient tolerated treatment well;Patient limited by fatigue Patient left: in chair;with call bell/phone within reach;with chair alarm set Nurse Communication: Mobility status PT Visit Diagnosis: Other abnormalities of gait and mobility (R26.89);Muscle weakness (generalized) (M62.81)     Time: 8588-5027 PT Time Calculation (min) (ACUTE ONLY): 28 min  Charges:  $Gait Training: 8-22 mins $Therapeutic Activity: 8-22 mins                     Mabeline Caras, PT, DPT Acute Rehabilitation Services  Pager [phone] Office [place] 06/27/2020, 9:20 AM

## 2020-06-27 NOTE — Progress Notes (Signed)
D/C instructions given and reviewed with pt. Tele and IV removed, tolerated well.

## 2020-06-27 NOTE — Consult Note (Signed)
   [facility] CM Inpatient Consult   06/27/2020  Timothea Bodenheimer 1925/02/25 [id number]  [place] [facility] [ACO] Patient: Marissa Jefferson Medicare    Patient screened for length of stay of 10 days  hospitalization and to assess for potential [place] Management service needs for post hospital transition.  Review of patient's medical record reveals patient is patient is home alone with minimal support. Met with patient at bedside.  Patient endoses Dr. Merrilee Seashore as her primary care provider with [facility], which is listed to provide the transition of care follow up calls.  Patient states she has a son locally but her daughter, [name], is the person who helps her with medical and care decisions. Explained [facility] Care Management follow up for post hospital follow up needs.  She states she had a person prior to Christmas that assisted her in getting her care needs met for appointments, ADLs, but moved away. She would like someone like that to assist her about 2 days a week.  She states she had "home health therapy prior to admission, they recently changed their name", she says. Bamboo/PING stories reveals she had been active with [facility].   Plan:  Will update inpatient TOC RN on findings and assign patient to a [facility] RN for post hospital follow up for HF management.    For questions contact:   Natividad Brood, RN BSN [facility] Liaison  [phone] business mobile phone Toll free office [phone]  Fax number: [phone] [email or web address]

## 2020-06-27 NOTE — Discharge Summary (Signed)
Physician Discharge Summary  Xandria Gallaga UTM:[id number] DOB: 1925/01/20 DOA: 06/17/2020  PCP: Merrilee Seashore, MD  Admit date: 06/17/2020 Discharge date: 06/27/2020  Admitted From: Home Disposition: Home  Recommendations for Outpatient Follow-up:  1. Follow up with PCP in 1-2 weeks 2. Repeat lab  BMP in 1 week 3. Follow-up with outpatient cardiology who will also follow-up with her labs 4. Lasix 20 mg twice daily.  Potassium supplements 5. Home oxygen 2 L arranged  Home Health: PT/OT/RN/aide Equipment/Devices: Home oxygen 2 L Discharge Condition: Stable CODE STATUS: Full code Diet recommendation: Heart healthy  Brief/Interim Summary: 87 year old female with history of chronic diastolic CHF, hypertension, hyperlipidemia, CAD s/p 3v CABG, hypothyroidism, DM presented with increasing shortness of breath for last 2 weeks. Patient ported symptoms started 2 weeks ago and gradually worsening, initially exertional. Then developed constant shortness of breath in the last 2 days, orthopnea at night. Patient also noted her shoes do not fit. In ED, patient was found to have O2 sats in 70s, improved to 80s on 5 L O2, failed BiPAP trial.  Admitted for further work-upfor acute respiratory failure, hypoxia, acute on chronic diastolic CHF.  Cardiology team is following and adjusting her diuretics as necessary.  Patient was diuresed in the hospital eventually transition to oral diuretics 20 mg twice daily.  Despite of lungs being clear she was hypoxic with ambulation therefore home oxygen 2 L was arranged. Today she is medically stable to be discharged Patient's daughter was thoroughly updated daily during the hospitalization and also on the day of discharge.  Assessment & Plan:   Principal Problem:   Acute respiratory failure with hypoxia (HCC) Active Problems:   Essential hypertension   Hypercholesterolemia   Diabetes mellitus type 2, controlled ([place])   Coronary artery disease involving native  coronary artery of native heart without angina pectoris   S/P CABG x 4   Hypothyroidism   GERD (gastroesophageal reflux disease)   Acute CHF (congestive heart failure) (HCC)   Pressure injury of skin    Acute respiratory failure with hypoxia (HCC) likely secondary to acute on chronic diastolic CHF, right heart failure. Class II -Symptomatically patient is feeling much better but still getting hypoxic to 87% on room air especially with ambulation therefore will arrange for 2 L nasal cannula at home. - 2DEcho: EF 55-60%, G2DD,right heart failure, large left pleural effusion, severe pulmonary hypertension -CT angiogram negative for PE -Cardiology following, home O2 evaluation done, still requiring 2-3 L O2 -Transition Lasix 20 mg twice daily with potassium supplements - Follow-up patient cardiology.  Follow-up to be arranged by their service.  Left pleural effusion -Improved with diuretics  Hypokalemia -Replete as needed  Essential hypertension -Coreg, lisinopril and Lasix  Hypercholesterolemia -Continue Lipitor 80 mg daily  Diabetes mellitus type 2, uncontrolled ([place]), not on long-term insulin with hypoglycemia -Resume her home regimen  Coronary artery disease S/P CABG -2D echo with right heart failure, preserved EF 55 to 60% -Troponins mildly elevated due to demand ischemiain the setting of acute decompensated diastolic CHF  Hypothyroidism -Continue Synthroid, TSH 2.1  Pressure injury of skin Stage I, medial sacrum, present on admission Wound care per nursing  Generalized debility -PT evaluation recommended home health PT   Body mass index is 20.89 kg/m.  Pressure Ulcer 03/22/14 Stage I -  Intact skin with non-blanchable redness of a localized area usually over a bony prominence. red, excoriated (Active)  03/22/14 0821  Location: Sacrum  Location Orientation: Mid  Staging: Stage I -  Intact skin  with non-blanchable redness of a localized  area usually over a bony prominence.  Wound Description (Comments): red, excoriated  Present on Admission:      Pressure Injury 06/17/20 Sacrum Medial Stage 1 -  Intact skin with non-blanchable redness of a localized area usually over a bony prominence. (Active)  06/17/20 1927  Location: Sacrum  Location Orientation: Medial  Staging: Stage 1 -  Intact skin with non-blanchable redness of a localized area usually over a bony prominence.  Wound Description (Comments):   Present on Admission: Yes        Discharge Diagnoses:  Principal Problem:   Acute respiratory failure with hypoxia ([place]) Active Problems:   Essential hypertension   Hypercholesterolemia   Diabetes mellitus type 2, controlled ([place])   Coronary artery disease involving native coronary artery of native heart without angina pectoris   S/P CABG x 4   Hypothyroidism   GERD (gastroesophageal reflux disease)   Acute CHF (congestive heart failure) (HCC)   Pressure injury of skin      Consultations:  Cardiology  Subjective: Feels great sitting up in the chair no complaints.  With ambulation she desaturates down to 87% on room air.  Discharge Exam: Vitals:   06/27/20 0400 06/27/20 0756  BP: 126/80 127/67  Pulse: (!) 56 (!) 59  Resp: 18 18  Temp: 98.2 F (36.8 C)   SpO2: 92% 96%   Vitals:   06/26/20 1851 06/26/20 1900 06/27/20 0400 06/27/20 0756  BP: (!) 108/57 102/75 126/80 127/67  Pulse: (!) 59 76 (!) 56 (!) 59  Resp:  18 18 18   Temp:  97.6 F (36.4 C) 98.2 F (36.8 C)   TempSrc:  Oral Oral Oral  SpO2:  94% 92% 96%  Weight:   58.7 kg   Height:        General: Pt is alert, awake, not in acute distress, elderly frail Cardiovascular: RRR, S1/S2 +, no rubs, no gallops Respiratory: CTA bilaterally, no wheezing, no rhonchi Abdominal: Soft, NT, ND, bowel sounds + Extremities: no edema, no cyanosis  Discharge Instructions  Discharge Instructions    Face-to-face encounter (required for  Medicare/Medicaid patients)   Complete by: As directed    I Kamara Allan Chirag Lindsay Soulliere certify that this patient is under my care and that I, or a nurse practitioner or physician's assistant working with me, had a face-to-face encounter that meets the physician face-to-face encounter requirements with this patient on 06/27/2020. The encounter with the patient was in whole, or in part for the following medical condition(s) which is the primary reason for home health care generalized weakness and chf   The encounter with the patient was in whole, or in part, for the following medical condition, which is the primary reason for home health care: weakness chf   I certify that, based on my findings, the following services are medically necessary home health services:  Nursing Physical therapy     Reason for Medically Necessary Home Health Services:  Therapy- Instruction on use of Assistive Device for Ambulation on all Surfaces Therapy- Skilled Evaluation of Speech Comprehension and Safe Swallowing     My clinical findings support the need for the above services: Unsafe ambulation due to balance issues   Further, I certify that my clinical findings support that this patient is homebound due to: Ambulates short distances less than 300 feet   Home Health   Complete by: As directed    To provide the following care/treatments:  PT [place]  Allergies as of 06/27/2020      Reactions   Amiodarone Other (See Comments)   Bradycardia   Crestor [rosuvastatin] Nausea Only   Sitagliptin Other (See Comments)   Caused urinary frequency      Medication List    TAKE these medications   acetaminophen 500 MG tablet Commonly known as: TYLENOL Take 1,000 mg by mouth See admin instructions. Take 1,000 mg by mouth in the morning & at bedtime and an additional 500 mg once a day as needed for pain/discomfort   amLODipine 10 MG tablet Commonly known as: NORVASC TAKE 1 TABLET BY MOUTH EVERY DAY    aspirin EC 81 MG tablet Take 1 tablet (81 mg total) by mouth daily.   atorvastatin 80 MG tablet Commonly known as: LIPITOR Take 80 mg by mouth daily.   Biotin 5000 MCG Subl Place 5,000 mcg under the tongue daily.   carvedilol 25 MG tablet Commonly known as: COREG TAKE 1 TABLET BY MOUTH TWICE A DAY WITH MEALS   denosumab 60 MG/ML Sosy injection Commonly known as: PROLIA Inject 60 mg into the skin every 6 (six) months.   docusate sodium 100 MG capsule Commonly known as: COLACE Take 100 mg by mouth See admin instructions. Take 100 mg by mouth at bedtime and HOLD FOR DIARRHEA   furosemide 40 MG tablet Commonly known as: Lasix Take 1 tablet (40 mg total) by mouth 2 (two) times daily.   levothyroxine 150 MCG tablet Commonly known as: SYNTHROID Take 150 mcg by mouth daily before breakfast.   lisinopril 10 MG tablet Commonly known as: ZESTRIL TAKE 1 TABLET BY MOUTH EVERY DAY   Magnesium Oxide 400 (240 Mg) MG Tabs TAKE 1 TABLET TWICE DAILY.   metFORMIN 500 MG tablet Commonly known as: GLUCOPHAGE Take 1,000 mg by mouth 2 (two) times daily.   Potassium Chloride ER 20 MEQ Tbcr Take 10 mEq by mouth daily.   protein supplement shake Liqd Commonly known as: PREMIER PROTEIN Take 237 mLs by mouth in the morning.   solifenacin 10 MG tablet Commonly known as: VESICARE Take 10 mg by mouth at bedtime.   SUPER B-50 COMPLEX PO Take 1 tablet by mouth daily before lunch.   traMADol 50 MG tablet Commonly known as: ULTRAM Take 50 mg by mouth 3 (three) times daily as needed for moderate pain.   Vitamin B-12 2500 MCG Subl Place 2,500 mcg under the tongue daily before lunch.   Vitamin D3 125 MCG (5000 UT) Caps Take 5,000 Units by mouth daily before lunch.   Voltaren 1 % Gel Generic drug: diclofenac Sodium Apply 2 g topically daily as needed (for bilateral knee pain).            Durable Medical Equipment  (From admission, onward)         Start     Ordered   06/27/20  0935  For home use only DME oxygen  Once       Comments: Dx: Hypoxia secondary to diastolic CHF  Question Answer Comment  Length of Need 6 Months   Mode or (Route) Nasal cannula   Liters per Minute 2   Frequency Continuous (stationary and portable oxygen unit needed)   Oxygen delivery system Gas      06/27/20 0934          Follow-up Information    Deberah Pelton, NP Follow up on 07/19/2020.   Specialty: Cardiology Why: Please arrive 15 min early to your 11:15 AM hospital cardiology follow  up appointment  Contact information: [address] [place] [address] [phone]        Merrilee Seashore, MD. Schedule an appointment as soon as possible for a visit in 1 week(s).   Specialty: Internal Medicine Contact information: [address] [place] [address] [phone]        Sanda Klein, MD .   Specialty: Cardiology Contact information: [address] [place] [address] [place] [address] [phone]              Allergies  Allergen Reactions  . Amiodarone Other (See Comments)    Bradycardia  . Crestor [Rosuvastatin] Nausea Only  . Sitagliptin Other (See Comments)    Caused urinary frequency    You were cared for by a hospitalist during your hospital stay. If you have any questions about your discharge medications or the care you received while you were in the hospital after you are discharged, you can call the unit and asked to speak with the hospitalist on call if the hospitalist that took care of you is not available. Once you are discharged, your primary care physician will handle any further medical issues. Please note that no refills for any discharge medications will be authorized once you are discharged, as it is imperative that you return to your primary care physician (or establish a relationship with a primary care physician if you do not have one) for your aftercare needs so that they can reassess your need for  medications and monitor your lab values.   Procedures/Studies: CT ANGIO CHEST PE W OR WO CONTRAST  Result Date: 06/19/2020 CLINICAL DATA:  87 year old female with clinical concern for pulmonary embolism. EXAM: CT ANGIOGRAPHY CHEST WITH CONTRAST TECHNIQUE: Multidetector CT imaging of the chest was performed using the standard protocol during bolus administration of intravenous contrast. Multiplanar CT image reconstructions and MIPs were obtained to evaluate the vascular anatomy. CONTRAST:  100 mL Omnipaque 350, intravenous COMPARISON:  01/20/2015 FINDINGS: Cardiovascular: Satisfactory opacification of the pulmonary arteries to the segmental level. No evidence of pulmonary embolism. Moderate global cardiomegaly. Severe coronary atherosclerotic calcifications. Scattered atherosclerotic calcifications of the thoracic aorta. Mediastinum/Nodes: No enlarged mediastinal, hilar, or axillary lymph nodes. Thyroid gland, trachea, and esophagus demonstrate no significant findings. Lungs/Pleura: Mild diffuse mosaic attenuation pattern, likely secondary to expiratory image acquisition. Small symmetric bilateral pleural effusions with associated bibasilar passive atelectasis. Similar appearing 6 mm rounded calcific density in the basilar posterior right pleural space. No pneumothorax. Upper Abdomen: The visualized upper abdomen is within normal limits. Musculoskeletal: Well-opposed median sternotomy. No acute osseous findings. Review of the MIP images confirms the above findings. IMPRESSION: Vascular: 1. No evidence of pulmonary embolism. 2. Coronary and aortic atherosclerosis (ICD10-I70.0). 3. Moderate global cardiomegaly. Non-Vascular: 1. Small bilateral pleural effusions with associated bibasilar passive atelectasis. 2. Diffuse mosaic attenuation pattern of the pulmonary parenchyma, likely secondary to expiratory image acquisition. Ruthann Cancer, MD [facility]  Electronically Signed   By: Ruthann Cancer MD   On: 06/19/2020 14:14   DG Chest Port 1 View  Result Date: 06/18/2020 CLINICAL DATA:  Shortness of breath EXAM: PORTABLE CHEST 1 VIEW COMPARISON:  June 17, 2020. FINDINGS: There is a persistent small left pleural effusion with consolidation in the left lower lung region. Small right pleural effusion noted. Focal airspace opacity lateral right base again noted without change. No new opacity evident. There is cardiomegaly with pulmonary vascularity normal. There is aortic atherosclerosis. Patient is status post coronary artery  bypass grafting. No appreciable adenopathy by portable radiography. Surgical clips noted in left axilla. IMPRESSION: Stable cardiomegaly. Small pleural effusions bilaterally. Consolidation consistent with atelectasis and potential superimposed pneumonia left lower lobe. Suspect small focus of infiltrate lateral right base. Appearance of the lungs is stable compared to 1 day prior. Status post coronary artery bypass grafting. Aortic Atherosclerosis (ICD10-I70.0). Electronically Signed   By: Lowella Grip III M.D.   On: 06/18/2020 09:35   DG Chest Portable 1 View  Result Date: 06/17/2020 CLINICAL DATA:  Short of breath, hypoxia, history of CHF EXAM: PORTABLE CHEST 1 VIEW COMPARISON:  Prior chest x-ray 03/29/2015 FINDINGS: Enlarged cardiopericardial silhouette consistent with cardiomegaly. Atherosclerotic calcifications visualized in the transverse aorta. Patient is status post median sternotomy for multivessel CABG. Bibasilar airspace opacities are present with blunting of the costophrenic angles consistent with a combination of layering pleural fluid with atelectasis and/or infiltrate. Mild vascular congestion without overt edema. Chronic bronchitic changes and interstitial prominence are similar compared to relatively remote prior imaging. No pneumothorax. Surgical clips again seen in the left axilla. No acute osseous abnormality.  IMPRESSION: 1. Cardiomegaly and pulmonary vascular congestion bordering on mild edema. 2. Bilateral moderate layering pleural effusions with associated bibasilar atelectasis and/or infiltrate. 3. Aortic atherosclerosis. Electronically Signed   By: Jacqulynn Cadet M.D.   On: 06/17/2020 12:41   ECHOCARDIOGRAM COMPLETE  Result Date: 06/18/2020    ECHOCARDIOGRAM REPORT   Patient Name:   MADILINE SAFFRAN Date of Exam: 06/18/2020 Medical Rec #:  [id number]  Height:       66.0 in Accession #:    [id number] Weight:       152.6 lb Date of Birth:  [birth year]  BSA:          1.782 m Patient Age:    60 years   BP:           122/75 mmHg Patient Gender: F          HR:           85 bpm. Exam Location:  Inpatient Procedure: 2D Echo, Cardiac Doppler and Color Doppler Indications:    CHF  History:        Patient has prior history of Echocardiogram examinations, most                 recent 03/22/2014. CHF; Prior CABG. CABG 10/09/2013.  Sonographer:    [place] Referring Phys: [id number] [place]  1. Left ventricular ejection fraction, by estimation, is 55 to 60%. The left ventricle has normal function. The left ventricle has no regional wall motion abnormalities. There is moderate left ventricular hypertrophy. Left ventricular diastolic parameters are consistent with Grade II diastolic dysfunction (pseudonormalization). Elevated left ventricular end-diastolic pressure. There is the interventricular septum is flattened in diastole ('D' shaped left ventricle), consistent with right ventricular volume overload and the interventricular septum is flattened in systole, consistent with right ventricular pressure overload.  2. Right ventricular systolic function is severely reduced. The right ventricular size is mildly enlarged. There is severely elevated pulmonary artery systolic pressure. The estimated right ventricular systolic pressure is 14.9 mmHg.  3. Left atrial size was mildly dilated.  4. Large pleural effusion in the  left lateral region.  5. The mitral valve is grossly normal. Mild mitral valve regurgitation.  6. Tricuspid valve regurgitation is moderate.  7. The NCC and the LCC are fused. . The aortic valve is tricuspid. Aortic valve regurgitation is not visualized. Mild aortic valve stenosis.  8. The inferior vena cava is dilated in size with <50% respiratory variability, suggesting right atrial pressure of 15 mmHg. FINDINGS  Left Ventricle: Left ventricular ejection fraction, by estimation, is 55 to 60%. The left ventricle has normal function. The left ventricle has no regional wall motion abnormalities. The left ventricular internal cavity size was normal in size. There is  moderate left ventricular hypertrophy. The interventricular septum is flattened in diastole ('D' shaped left ventricle), consistent with right ventricular volume overload and the interventricular septum is flattened in systole, consistent with right ventricular pressure overload. Left ventricular diastolic parameters are consistent with Grade II diastolic dysfunction (pseudonormalization). Elevated left ventricular end-diastolic pressure. Right Ventricle: The right ventricular size is mildly enlarged. Right vetricular wall thickness was not well visualized. Right ventricular systolic function is severely reduced. There is severely elevated pulmonary artery systolic pressure. The tricuspid  regurgitant velocity is 3.47 m/s, and with an assumed right atrial pressure of 15 mmHg, the estimated right ventricular systolic pressure is 29.7 mmHg. Left Atrium: Left atrial size was mildly dilated. Right Atrium: Right atrial size was normal in size. Pericardium: There is no evidence of pericardial effusion. Mitral Valve: The mitral valve is grossly normal. There is mild thickening of the mitral valve leaflet(s). There is mild calcification of the mitral valve leaflet(s). Mild mitral annular calcification. Mild mitral valve regurgitation. Tricuspid Valve: The  tricuspid valve is normal in structure. Tricuspid valve regurgitation is moderate. Aortic Valve: The [place] and the [place] are fused. The aortic valve is tricuspid. Aortic valve regurgitation is not visualized. Mild aortic stenosis is present. Aortic valve mean gradient measures 7.0 mmHg. Aortic valve peak gradient measures 10.4 mmHg. Aortic  valve area, by VTI measures 1.40 cm. Pulmonic Valve: The pulmonic valve was normal in structure. Pulmonic valve regurgitation is mild. Aorta: The aortic root and ascending aorta are structurally normal, with no evidence of dilitation. Venous: The inferior vena cava is dilated in size with less than 50% respiratory variability, suggesting right atrial pressure of 15 mmHg. IAS/Shunts: No atrial level shunt detected by color flow Doppler. Additional Comments: There is a large pleural effusion in the left lateral region.  LEFT VENTRICLE PLAX 2D LVIDd:         3.60 cm     Diastology LVIDs:         2.60 cm     LV e' medial:    2.81 cm/s LV PW:         1.25 cm     LV E/e' medial:  27.7 LV IVS:        1.50 cm     LV e' lateral:   2.69 cm/s LVOT diam:     2.00 cm     LV E/e' lateral: 29.0 LV SV:         50 LV SV Index:   28 LVOT Area:     3.14 cm  LV Volumes (MOD) LV vol d, MOD A2C: 59.8 ml LV vol d, MOD A4C: 46.4 ml LV vol s, MOD A4C: 21.5 ml LV SV MOD A4C:     46.4 ml RIGHT VENTRICLE RV S prime:     5.93 cm/s  PULMONARY VEINS TAPSE (M-mode): 0.7 cm     A Reversal Duration: 53.00 msec                            A Reversal Velocity: 26.10 cm/s  Diastolic Velocity:  69.67 cm/s                            S/D Velocity:        1.50                            Systolic Velocity:   89.38 cm/s LEFT ATRIUM             Index       RIGHT ATRIUM           Index LA diam:        3.80 cm 2.13 cm/m  RA Area:     17.30 cm LA Vol (A2C):   51.7 ml 29.01 ml/m RA Volume:   50.00 ml  28.05 ml/m LA Vol (A4C):   62.5 ml 35.06 ml/m LA Biplane Vol: 62.4 ml 35.01 ml/m  AORTIC VALVE                     PULMONIC VALVE AV Area (Vmax):    1.60 cm     PV Vmax:       0.86 m/s AV Area (Vmean):   1.30 cm     PV Vmean:      54.600 cm/s AV Area (VTI):     1.40 cm     PV VTI:        0.132 m AV Vmax:           161.00 cm/s  PV Peak grad:  2.9 mmHg AV Vmean:          131.000 cm/s PV Mean grad:  1.0 mmHg AV VTI:            0.354 m AV Peak Grad:      10.4 mmHg AV Mean Grad:      7.0 mmHg LVOT Vmax:         82.20 cm/s LVOT Vmean:        54.400 cm/s LVOT VTI:          0.158 m LVOT/AV VTI ratio: 0.45  AORTA Ao Root diam: 3.50 cm Ao Asc diam:  3.50 cm MITRAL VALVE               TRICUSPID VALVE MV Area (PHT): 2.99 cm    TR Peak grad:   48.2 mmHg MV Decel Time: 254 msec    TR Vmax:        347.00 cm/s MV E velocity: 77.90 cm/s MV A velocity: 90.90 cm/s  SHUNTS MV E/A ratio:  0.86        Systemic VTI:  0.16 m                            Systemic Diam: 2.00 cm Mertie Moores MD Electronically signed by Mertie Moores MD Signature Date/Time: 06/18/2020/11:40:52 AM    Final       The results of significant diagnostics from this hospitalization (including imaging, microbiology, ancillary and laboratory) are listed below for reference.     Microbiology: Recent Results (from the past 240 hour(s))  Resp Panel by RT-PCR (Flu A&B, Covid) Nasopharyngeal Swab     Status: None   Collection Time: 06/17/20  2:20 PM   Specimen: Nasopharyngeal Swab; Nasopharyngeal(NP) swabs in vial transport medium  Result Value Ref Range Status   SARS Coronavirus 2 by RT PCR NEGATIVE  NEGATIVE Final    Comment: (NOTE) SARS-CoV-2 target nucleic acids are NOT DETECTED.  The SARS-CoV-2 RNA is generally detectable in upper respiratory specimens during the acute phase of infection. The lowest concentration of SARS-CoV-2 viral copies this assay can detect is 138 copies/mL. A negative result does not preclude SARS-Cov-2 infection and should not be used as the sole basis for treatment or other patient management decisions. A negative result  may occur with  improper specimen collection/handling, submission of specimen other than nasopharyngeal swab, presence of viral mutation(s) within the areas targeted by this assay, and inadequate number of viral copies(<138 copies/mL). A negative result must be combined with clinical observations, patient history, and epidemiological information. The expected result is Negative.  Fact Sheet for Patients:  EntrepreneurPulse.com.au  Fact Sheet for Healthcare Providers:  IncredibleEmployment.be  This test is no t yet approved or cleared by the [place] FDA and  has been authorized for detection and/or diagnosis of SARS-CoV-2 by FDA under an Emergency Use Authorization (EUA). This EUA will remain  in effect (meaning this test can be used) for the duration of the COVID-19 declaration under Section 564(b)(1) of the Act, 21 U.S.C.section 360bbb-3(b)(1), unless the authorization is terminated  or revoked sooner.       Influenza A by PCR NEGATIVE NEGATIVE Final   Influenza B by PCR NEGATIVE NEGATIVE Final    Comment: (NOTE) The Xpert Xpress SARS-CoV-2/FLU/RSV plus assay is intended as an aid in the diagnosis of influenza from Nasopharyngeal swab specimens and should not be used as a sole basis for treatment. Nasal washings and aspirates are unacceptable for Xpert Xpress SARS-CoV-2/FLU/RSV testing.  Fact Sheet for Patients: EntrepreneurPulse.com.au  Fact Sheet for Healthcare Providers: IncredibleEmployment.be  This test is not yet approved or cleared by the [place] FDA and has been authorized for detection and/or diagnosis of SARS-CoV-2 by FDA under an Emergency Use Authorization (EUA). This EUA will remain in effect (meaning this test can be used) for the duration of the COVID-19 declaration under Section 564(b)(1) of the Act, 21 U.S.C. section 360bbb-3(b)(1), unless the authorization is terminated  or revoked.  Performed at [facility], [place] [address], [place], [place] [address]      Labs: BNP (last 3 results) Recent Labs    06/17/20 1127 06/19/20 0155 06/22/20 0701  BNP 892.4* 700.5* 564.3*   Basic Metabolic Panel: Recent Labs  Lab 06/23/20 0358 06/24/20 0143 06/25/20 0213 06/26/20 0253 06/27/20 0327  NA 138 137 136 137 138  K 3.5 4.1 4.1 3.6 3.9  CL 97* 97* 94* 94* 95*  CO2 30 32 35* 31 32  GLUCOSE 133* 146* 147* 150* 187*  BUN 23 24* 25* 35* 35*  CREATININE 0.90 0.77 0.82 0.85 0.88  CALCIUM 9.8 10.0 10.1 10.2 10.0  MG 1.8 2.0 2.0 2.0 2.2   Liver Function Tests: No results for input(s): AST, ALT, ALKPHOS, BILITOT, PROT, ALBUMIN in the last 168 hours. No results for input(s): LIPASE, AMYLASE in the last 168 hours. No results for input(s): AMMONIA in the last 168 hours. CBC: Recent Labs  Lab 06/27/20 0327  WBC 8.5  HGB 16.4*  HCT 50.8*  MCV 104.5*  PLT 204   Cardiac Enzymes: No results for input(s): CKTOTAL, CKMB, CKMBINDEX, TROPONINI in the last 168 hours. BNP: Invalid input(s): POCBNP CBG: Recent Labs  Lab 06/26/20 0512 06/26/20 1133 06/26/20 1623 06/26/20 2149 06/27/20 0546  GLUCAP 193* 258* 136* 305* 180*   D-Dimer No results for input(s): DDIMER in the  last 72 hours. Hgb A1c No results for input(s): HGBA1C in the last 72 hours. Lipid Profile No results for input(s): CHOL, HDL, LDLCALC, TRIG, CHOLHDL, LDLDIRECT in the last 72 hours. Thyroid function studies No results for input(s): TSH, T4TOTAL, T3FREE, THYROIDAB in the last 72 hours.  Invalid input(s): FREET3 Anemia work up No results for input(s): VITAMINB12, FOLATE, FERRITIN, TIBC, IRON, RETICCTPCT in the last 72 hours. Urinalysis    Component Value Date/Time   COLORURINE AMBER (A) 03/22/2014 1509   APPEARANCEUR CLOUDY (A) 03/22/2014 1509   LABSPEC 1.024 03/22/2014 1509   PHURINE 5.0 03/22/2014 1509   GLUCOSEU NEGATIVE 03/22/2014 1509   HGBUR NEGATIVE 03/22/2014  1509   BILIRUBINUR SMALL (A) 03/22/2014 1509   KETONESUR 15 (A) 03/22/2014 1509   PROTEINUR 30 (A) 03/22/2014 1509   UROBILINOGEN 0.2 03/22/2014 1509   NITRITE NEGATIVE 03/22/2014 1509   LEUKOCYTESUR SMALL (A) 03/22/2014 1509   Sepsis Labs Invalid input(s): PROCALCITONIN,  WBC,  LACTICIDVEN Microbiology Recent Results (from the past 240 hour(s))  Resp Panel by RT-PCR (Flu A&B, Covid) Nasopharyngeal Swab     Status: None   Collection Time: 06/17/20  2:20 PM   Specimen: Nasopharyngeal Swab; Nasopharyngeal(NP) swabs in vial transport medium  Result Value Ref Range Status   SARS Coronavirus 2 by RT PCR NEGATIVE NEGATIVE Final    Comment: (NOTE) SARS-CoV-2 target nucleic acids are NOT DETECTED.  The SARS-CoV-2 RNA is generally detectable in upper respiratory specimens during the acute phase of infection. The lowest concentration of SARS-CoV-2 viral copies this assay can detect is 138 copies/mL. A negative result does not preclude SARS-Cov-2 infection and should not be used as the sole basis for treatment or other patient management decisions. A negative result may occur with  improper specimen collection/handling, submission of specimen other than nasopharyngeal swab, presence of viral mutation(s) within the areas targeted by this assay, and inadequate number of viral copies(<138 copies/mL). A negative result must be combined with clinical observations, patient history, and epidemiological information. The expected result is Negative.  Fact Sheet for Patients:  EntrepreneurPulse.com.au  Fact Sheet for Healthcare Providers:  IncredibleEmployment.be  This test is no t yet approved or cleared by the [place] FDA and  has been authorized for detection and/or diagnosis of SARS-CoV-2 by FDA under an Emergency Use Authorization (EUA). This EUA will remain  in effect (meaning this test can be used) for the duration of the COVID-19 declaration  under Section 564(b)(1) of the Act, 21 U.S.C.section 360bbb-3(b)(1), unless the authorization is terminated  or revoked sooner.       Influenza A by PCR NEGATIVE NEGATIVE Final   Influenza B by PCR NEGATIVE NEGATIVE Final    Comment: (NOTE) The Xpert Xpress SARS-CoV-2/FLU/RSV plus assay is intended as an aid in the diagnosis of influenza from Nasopharyngeal swab specimens and should not be used as a sole basis for treatment. Nasal washings and aspirates are unacceptable for Xpert Xpress SARS-CoV-2/FLU/RSV testing.  Fact Sheet for Patients: EntrepreneurPulse.com.au  Fact Sheet for Healthcare Providers: IncredibleEmployment.be  This test is not yet approved or cleared by the [place] FDA and has been authorized for detection and/or diagnosis of SARS-CoV-2 by FDA under an Emergency Use Authorization (EUA). This EUA will remain in effect (meaning this test can be used) for the duration of the COVID-19 declaration under Section 564(b)(1) of the Act, 21 U.S.C. section 360bbb-3(b)(1), unless the authorization is terminated or revoked.  Performed at [facility], [place] [address], [place], [place] [address]  Time coordinating discharge:  I have spent 35 minutes face to face with the patient and on the ward discussing the patients care, assessment, plan and disposition with other care givers. >50% of the time was devoted counseling the patient about the risks and benefits of treatment/Discharge disposition and coordinating care.   SIGNED:   Damita Lack, MD  [facility] 06/27/2020, 10:49 AM   If 7PM-7AM, please contact night-coverage

## 2020-06-27 NOTE — TOC Transition Note (Addendum)
Transition of Care [facility]) - CM/SW Discharge Note   Patient Details  Name: Marissa Jefferson MRN: [id number] Date of Birth: [birth year]  [facility]) CM/SW Contact:  Zenon Mayo, RN Phone Number: 06/27/2020, 3:14 PM   Clinical Narrative:    Patient is for dc today, NCM offered choice, patient states she has [place] with an agency but could not remember the name. Patient states to call her daughter, NCM contacted her daughter Webb Silversmith, she states the agency has changed their name but could not remember.  NCM informed her it must be [facility] because they are the only ones that has changed their name at this time.  NCM contacted Stacie at [facility] she states yes patient is active with them for [facility], [place], [place], will need to add an aide per orders.  Patient will need home oxygen as well, daughter is ok with using [employer] to supply the home oxygen.  NCM made referral to Thedore Mins this am , they will set up concentrator at the home.  NCM spoke with daughter and she states she will need ambulance transport either thru [facility] or ptar, she has 24 steps to go up.  Also daughter states they need portable oxygen, the order is in for poc evaluation at home.  Daughter was stating that she was told that patient could have the portable today when she gets home, NCM informed her that she will have to be evaluated for this at home and this will take a couple days for that to be set up, daughter states that is not what she was told. NCM will have MD to call daughter, because she states MD told her something else.  Per Thedore Mins with [employer] time for arrival to home for oxygen is 5 pm.  NCM will set up transport for 5 pm . Will try to see if [facility] has availability per daughter thur their transport for 5 pm. [facility] is transporting patient home via ambulance.   Final next level of care: [place] Barriers to Discharge: No Barriers Identified   Patient Goals and CMS Choice Patient states  their goals for this hospitalization and ongoing recovery are:: return home with [facility] CMS Medicare.gov Compare Post Acute Care list provided to:: Patient Represenative (must comment) Choice offered to / list presented to : Adult Children  Discharge Placement                       Discharge Plan and Services                DME Arranged: Oxygen DME Agency: [facility] Date DME Agency Contacted: 06/27/20 Time DME Agency Contacted: 10 Representative spoke with at DME Agency: Thedore Mins HH Arranged: RN,PT,OT,Nurse's Aide [place]:  Ileene Hutchinson) Date [place]: 06/27/20 Time [place]: 1000 Representative spoke with at [place]: [place] ([place]) Interventions     Readmission Risk Interventions No flowsheet data found.

## 2020-06-27 NOTE — Progress Notes (Addendum)
Progress Note  Patient Name: Marissa Jefferson Date of Encounter: 06/27/2020  [facility] Cardiologist: Sanda Klein, MD  Subjective   Breathing back to normal.   Inpatient Medications    Scheduled Meds: . aspirin EC  81 mg Oral Daily  . atorvastatin  80 mg Oral QPM  . carvedilol  25 mg Oral BID WC  . cholecalciferol  5,000 Units Oral Daily  . darifenacin  15 mg Oral Daily  . enoxaparin (LOVENOX) injection  40 mg Subcutaneous Q24H  . furosemide  80 mg Intravenous BID  . insulin aspart  0-15 Units Subcutaneous TID WC  . insulin glargine  8 Units Subcutaneous QHS  . levothyroxine  150 mcg Oral Daily  . magnesium oxide  400 mg Oral BID  . sodium chloride flush  3 mL Intravenous Q12H   Continuous Infusions: . sodium chloride     PRN Meds: sodium chloride, acetaminophen, diclofenac Sodium, docusate sodium, hydrALAZINE, lip balm, ondansetron (ZOFRAN) IV, sodium chloride flush   Vital Signs    Vitals:   06/26/20 1851 06/26/20 1900 06/27/20 0400 06/27/20 0756  BP: (!) 108/57 102/75 126/80 127/67  Pulse: (!) 59 76 (!) 56 (!) 59  Resp:  18 18 18   Temp:  97.6 F (36.4 C) 98.2 F (36.8 C)   TempSrc:  Oral Oral Oral  SpO2:  94% 92% 96%  Weight:   58.7 kg   Height:        Intake/Output Summary (Last 24 hours) at 06/27/2020 0949 Last data filed at 06/27/2020 0900 Gross per 24 hour  Intake 563 ml  Output 1625 ml  Net -1062 ml   Last 3 Weights 06/27/2020 06/26/2020 06/25/2020  Weight (lbs) 129 lb 6.6 oz 130 lb 1.6 oz 129 lb 10.1 oz  Weight (kg) 58.7 kg 59.013 kg 58.8 kg      Telemetry    Sinus bradycardia/rhythm - Personally Reviewed  ECG    [name]  Physical Exam   GEN: No acute distress.   Neck: No JVD Cardiac: RRR, no murmurs, rubs, or gallops.  Respiratory: Clear to auscultation bilaterally. GI: Soft, nontender, non-distended  MS: No edema; No deformity. Neuro:  Nonfocal  Psych: Normal affect   Labs    High Sensitivity Troponin:   Recent Labs  Lab  06/17/20 1126 06/17/20 1426  TROPONINIHS 33* 28*      Chemistry Recent Labs  Lab 06/25/20 0213 06/26/20 0253 06/27/20 0327  NA 136 137 138  K 4.1 3.6 3.9  CL 94* 94* 95*  CO2 35* 31 32  GLUCOSE 147* 150* 187*  BUN 25* 35* 35*  CREATININE 0.82 0.85 0.88  CALCIUM 10.1 10.2 10.0  GFRNONAA >60 >60 >60  ANIONGAP 7 12 11      Hematology Recent Labs  Lab 06/27/20 0327  WBC 8.5  RBC 4.86  HGB 16.4*  HCT 50.8*  MCV 104.5*  MCH 33.7  MCHC 32.3  RDW 12.7  PLT 204    BNP Recent Labs  Lab 06/22/20 0701  BNP 546.5*     DDimer   Radiology    No results found.  Cardiac Studies   Echo 06/18/2020 1. Left ventricular ejection fraction, by estimation, is 55 to 60%. The  left ventricle has normal function. The left ventricle has no regional  wall motion abnormalities. There is moderate left ventricular hypertrophy.  Left ventricular diastolic  parameters are consistent with Grade II diastolic dysfunction  (pseudonormalization). Elevated left ventricular end-diastolic pressure.  There is the interventricular septum is flattened  in diastole ('D' shaped  left ventricle), consistent with right  ventricular volume overload and the interventricular septum is flattened  in systole, consistent with right ventricular pressure overload.  2. Right ventricular systolic function is severely reduced. The right  ventricular size is mildly enlarged. There is severely elevated pulmonary  artery systolic pressure. The estimated right ventricular systolic  pressure is 84.6 mmHg.  3. Left atrial size was mildly dilated.  4. Large pleural effusion in the left lateral region.  5. The mitral valve is grossly normal. Mild mitral valve regurgitation.  6. Tricuspid valve regurgitation is moderate.  7. The NCC and the LCC are fused. . The aortic valve is tricuspid. Aortic  valve regurgitation is not visualized. Mild aortic valve stenosis.  8. The inferior vena cava is dilated in size  with <50% respiratory  variability, suggesting right atrial pressure of 15 mmHg.   Patient Profile     87 y.o. female  with CAD s/p CV CABG, HTN with DM, HLD with DM, HFpEF with subacute RV Failure and PH, Mild AS who presented with hypoxic respiratory failure.   Assessment & Plan    1.  Acute hypoxic respiratory failure 2.  Acute on chronic diastolic heart failure - Patient presented with worsening SOB x 2 weeks found to be volume overloaded with pulmonary edema on CXR and BNP 900. Trop flat and ECG without ischemic changes. Initially required BiPAP now improved and weaned to [facility]. -Echocardiogram showed LV function of 55 to 96%, grade 2 diastolic dysfunction, elevated LVEDP, right ventricular volume overload.  Severely reduced RV function.  RVSP of 63 mmHg. -Lasix titrated up to IV 80 mg twice daily.  Net INO almost 12 L.  Weight down to 143>>129lb.  -Continue Coreg 25 mg twice daily - Renal function normal.  Also has diabetes mellitus.>> resume home lisinopril after follow up labs on Lasix (after we are sure her creat is OK on diuretic at home).  - Still requiring oxygen. She is euvolemic. Attending is planning to send home later today with 2L of oxygen. She has to climb 20 steps to go her to apartment.  -Lasix 40mg  BID at discharge   3.  CAD - No angina - Continue ASA, statin and BB  4. HTN - BP stable on current medications.   For questions or updates, please contact [place] Please consult [email or web address] for contact info under     SignedLeanor Kail, PA  06/27/2020, 9:49 AM    History and all data above reviewed.  Patient examined.  I agree with the findings as above.  The patient exam reveals COR:RRR  ,  Lungs: Few basilar crackles  ,  Abd: Positive bowel sounds, no rebound no guarding, Ext No edema  .  All available labs, radiology testing, previous records reviewed. Agree with documented assessment and plan.    Acute on chronic diastolic HF:  I would send home off  of lisinopril until we see what her creat does on the Lasix. I would suggest Lasix 40 mg BID.  We are going to see if we can arrange a virtual TOC app with blood draw at home given her mobility, need for home O2 and the 20 stairs that she has to navigate.    Jeneen Rinks Dana Debo  10:26 AM  06/27/2020

## 2020-06-29 ENCOUNTER — Telehealth: Payer: Self-pay | Admitting: Cardiovascular Disease

## 2020-06-29 NOTE — Telephone Encounter (Signed)
Returned call to patients daughter Marissa Jefferson per [facility]) who states that patient was discharged on Monday from the hospital on home oxygen and home health with PT and OT.   Patients daughter states that no one has been by to see patient, but reports that an RN case manager is coming by tomorrow to patients home to see patient.   Patients daughter states that she needs an order in the mean time to titrate patients oxygen if needed. Patients daughter states patient is on 2L and is doing okay on that but states that sometimes during the hospital admission patients o2 saturations dropped and patient required to be bumped up to 4L at times. Patients daughter would like to know if Dr. Loletha Grayer could give an order or guidelines to titrate oxygen if needed.   Patients daughter will also reach out to PCP regarding this as well.   Advised patients daughter I would forward message to Dr. Loletha Grayer for review.   Patients daughter verbalized understanding.

## 2020-06-29 NOTE — Telephone Encounter (Signed)
New message:     Patient daughter calling to see if some one to call her concering her mother, patient need orders for Oxgen and they have a few question concering the patient care. Patient was released from the hospital Monday 18, 2022. Please call patient daughter.

## 2020-06-30 ENCOUNTER — Other Ambulatory Visit: Payer: Self-pay | Admitting: *Deleted

## 2020-06-30 DIAGNOSIS — I509 Heart failure, unspecified: Secondary | ICD-10-CM

## 2020-06-30 NOTE — Patient Outreach (Signed)
[facility]) Care Management  06/30/2020  Leilani Cespedes [birth year] [id number]   Referral Received 4/19 Transition of care to be completed by pt's provider  Outreach #1 RN spoke with pt who provided permission to speak with her daughter Dalbert Mayotte). RN introduced [facility] services with several inquires on pt's needs. Reported they have spoke with the pt's provider and aware of the pending appointments via discharge paperwork. States the [facility] nurse will be visiting today for additional review of some discrepancy on pt's Lasix (will clarity with provider). Caregiver does not wish to review with this RN case Freight forwarder. Other needs requested for mobile meals/meals on wheels/aide/sitter services. Offered care-guide referral for information packet on the needed community resources. States [facility] Falling will be providing post op hospitalization meals to the home for a few days.   RN inquired on pt's medical needs related to education and management of care. Discussed pt's HF due to daughter's indication of a new diagnosis. Educated accordingly related to the importance of daily weights and to document all weights. Strongly encouraged caregiver the the following: Weights should be obtained after emptying the bladder every AM and if pt is 3 lbs overnight or 5 lbs within one week of fluid retention to contact her CAD or primary provider foe interventions. If symptoms get worse with no improvement and pt having difficulty breathing more exacerbated to seek immediately medical attention.  Offered to enroll pt into the HF program (receptive) however caregiver was receiving a call from the provider's office at this time of this call that ended. RN unable to obtain specific information to completed the care plan. Informed caregiver if unable to complete today will attempt another call next week for enrollment.  Will send appropriate letters to both the pt and the provider as pt now active but assessment  will be completed next week along with the plan of care for HF management.  Raina Mina, RN Care Management Coordinator [place] Office [phone]

## 2020-06-30 NOTE — Telephone Encounter (Signed)
I would recommend increasing the furosemide to 40 mg daily. If she does not want to take it twice a day, it's fine to take the whole 40 mg dose at one time, once daily. I see an appt w Coletta Memos on 5/10. Please make sure that the Monday labs include both a BNP and a BMP.

## 2020-06-30 NOTE — Telephone Encounter (Signed)
Returned call to patients daughter (okay per DPR) made patients daughter aware of Dr. Lurline Del recommendations. Patients daughter states that PCP gave orders for home o2.   Patients daughter would like to make Dr. Loletha Grayer aware that patient is currently taking 20mg  Lasix twice daily instead of 40mg  and is taking 76meq of potassium daily.  Per patients daughter patient is to have labs drawn on Monday and will have follow up appointment on Monday with PCP.   Advised patients daughter I would forward message to Dr. Loletha Grayer to make him aware. Advised patients daughter to return call to office with any issues, questions, or concerns.   Patients daughter verbalized understanding.

## 2020-06-30 NOTE — Telephone Encounter (Signed)
Preferably defer to PCP or Pulmonary specialist, but it is perfectly reasonable to increase O2 temporarily as needed if O2 sat 90% or less at rest or if dyspneic with activity

## 2020-07-01 NOTE — Telephone Encounter (Signed)
Spoke with the patient's daughter. She stated that there was some confusion about what dosage the patient should be taking of the furosemide since discharge from the hospital. They were told 20 mg bid but the discharge papers stated 40 mg bid.  The patient's weight is steady and she denies any worsening shortness of breath. The daughter called the PCP as well as was advised by them to stay on the Furosemide 20 mg bid until her appointment on Monday with them and when she has labs drawn.  The daughter has been advised to call back if anything further is needed.

## 2020-07-04 ENCOUNTER — Telehealth: Payer: Self-pay | Admitting: Internal Medicine

## 2020-07-04 ENCOUNTER — Encounter: Payer: Self-pay | Admitting: *Deleted

## 2020-07-04 ENCOUNTER — Other Ambulatory Visit: Payer: Self-pay | Admitting: *Deleted

## 2020-07-04 DIAGNOSIS — E1165 Type 2 diabetes mellitus with hyperglycemia: Secondary | ICD-10-CM | POA: Diagnosis not present

## 2020-07-04 DIAGNOSIS — I509 Heart failure, unspecified: Secondary | ICD-10-CM

## 2020-07-04 DIAGNOSIS — E1122 Type 2 diabetes mellitus with diabetic chronic kidney disease: Secondary | ICD-10-CM | POA: Diagnosis not present

## 2020-07-04 DIAGNOSIS — E782 Mixed hyperlipidemia: Secondary | ICD-10-CM | POA: Diagnosis not present

## 2020-07-04 DIAGNOSIS — I2510 Atherosclerotic heart disease of native coronary artery without angina pectoris: Secondary | ICD-10-CM | POA: Diagnosis not present

## 2020-07-04 DIAGNOSIS — Z0000 Encounter for general adult medical examination without abnormal findings: Secondary | ICD-10-CM | POA: Diagnosis not present

## 2020-07-04 DIAGNOSIS — I5022 Chronic systolic (congestive) heart failure: Secondary | ICD-10-CM | POA: Diagnosis not present

## 2020-07-04 DIAGNOSIS — I129 Hypertensive chronic kidney disease with stage 1 through stage 4 chronic kidney disease, or unspecified chronic kidney disease: Secondary | ICD-10-CM | POA: Diagnosis not present

## 2020-07-04 DIAGNOSIS — N1831 Chronic kidney disease, stage 3a: Secondary | ICD-10-CM | POA: Diagnosis not present

## 2020-07-04 DIAGNOSIS — M810 Age-related osteoporosis without current pathological fracture: Secondary | ICD-10-CM | POA: Diagnosis not present

## 2020-07-04 DIAGNOSIS — Z09 Encounter for follow-up examination after completed treatment for conditions other than malignant neoplasm: Secondary | ICD-10-CM | POA: Diagnosis not present

## 2020-07-04 NOTE — Telephone Encounter (Signed)
   Telephone encounter was:  Successful.  07/04/2020 Name: Marissa Jefferson MRN: [id number] DOB: 1924-10-09  Marissa Jefferson is a 87 y.o. year old female who is a primary care patient of Merrilee Seashore, MD . The community resource team was consulted for assistance with Food Insecurity, Caregiver Stress and in home care  Care guide performed the following interventions: Patient provided with information about care guide support team and interviewed to confirm resource needs Discussed resources to assist with food delivery and for in home care as well as the care giver stress for her daughter [name]. They were on their way to the doctors office and couldn't talk long, but wants to talk about these things. [name] will call me when she is ready. .  Follow Up Plan:  Care guide will follow up with patient by phone over the next week  [place], Care Management Phone: [phone] Email: [email or web address]

## 2020-07-04 NOTE — Patient Outreach (Signed)
[facility]) Care Management  07/04/2020  Marissa Jefferson May 28, 1924 [id number]   Telephone Assessment-Successful-Heart Failure  Spoke has previous spoken with the pt's daughter Marissa Jefferson and returned a call today to complete the initial assessment. Daughter indicates pt currently on home O2 2 liters and doing weill with no residual symptoms. Pt remains around her baseline weight no more then 2 lbs and without any symptoms (GREEN zone verified). Currently pt has support system with family who is with the pt 24/7 however seek possible resources that would assist with overnight stays or live-in aide. RN offered community resources services for available agencies that may provide such services (referral to care-guide-receptive).   Generated a plan of care and discussed with all goals and interventions. Will update in a few weeks pending pt's scheduled appointments. Not letters have been send and provider will continue to be updated accordingly concerning pt's disposition with [facility] services.  Goals Addressed            This Visit's Progress   . THN-Comorbidities Identified and Managed       Follow up Date: 08/04/2020 Timeframe:  Long-Range Goal Priority:  Medium Start Date:    07/04/2020                         Expected End Date: 10/07/2020                       Evidence-based guidance:   Assess and address signs/symptoms of comorbidity, including dyslipidemia, diabetes, iron deficiency, gout, arthritis, dysrhythmia, hypertension, cachexia, coronary artery disease, kidney dysfunction and lung disease.   Prepare patient for laboratory and diagnostic exams based on risk and presentation.   Prepare for use of pharmacologic therapy that may include statin, angiotensin converting enzyme (ACE) inhibitor, angiotensin receptor blocker (ARB), beta-blocker, digoxin, antidysrhythmic, diuretic or omega-3 fatty acid.   Monitor side effects and anticipate need for periodic adjustments.    Prepare patient for potential invasive treatment, such as implantable cardioverter-defibrillator, cardiac resynchronization therapy or heart transplant as disease progresses.   Notes:  4/25-Verified all other comorbidities are managed well and continue to encourage with ongoing medication regimen and attendance to all medical appointments post-operatively.     Linward [place] and Keep All Appointments       Follow Up Date 08/04/2020 Timeframe:  Short-Term Goal Priority:  Medium Start Date:  07/04/2020                           Expected End Date:  08/09/2020                   - arrange a ride through an agency 1 week before appointment - ask family or friend for a ride - call to cancel if needed - keep a calendar with prescription refill dates - keep a calendar with appointment dates    Why is this important?    Part of staying healthy is seeing the doctor for follow-up care.   If you forget your appointments, there are some things you can do to stay on track.    Notes:  4/25-Pt's caregiver reports pt currently has 24/7 family/friends in the home and sufficient transportation available for all medical appointments. Will educate on additional transportation services with [employer] if needed in the future.      Raina Mina, RN Care Management Coordinator [place] Office [phone]

## 2020-07-07 ENCOUNTER — Telehealth: Payer: Self-pay | Admitting: Internal Medicine

## 2020-07-07 NOTE — Telephone Encounter (Signed)
   Telephone encounter was:  Successful.  07/07/2020 Name: Marissa Jefferson MRN: [id number] DOB: 1924-07-24  Marissa Jefferson is a 87 y.o. year old female who is a primary care patient of Merrilee Seashore, MD . The community resource team was consulted for assistance with setting her up with MOM's meals and giving her a list of in home care professionals.   Care guide performed the following interventions: Obtained verbal consent to place patient referral to MOM's meals and discussed possible resources for in home paid for out of pocket. [name] could not talk for long at this time because the [facility] nurse was there and she had to answer their questions. She will call me back to discuss further in home care aids.  Placed referral to MOM's meals via email to [place] Will need to finish conversation regarding in home care and what she wants to do once the MOM's meals stop coming, if MOW's is an option since pt has diabetes and needs a lower sodium diet due to heart condition. .  Follow Up Plan:  Care guide will follow up with patient by phone over the next two weeks.   [place], Care Management Phone: [phone] Email: [email or web address]

## 2020-07-07 NOTE — Telephone Encounter (Signed)
   Telephone encounter was:  Successful.  07/07/2020 Name: Marissa Jefferson MRN: [id number] DOB: Sep 04, 1924  Marissa Jefferson is a 87 y.o. year old female who is a primary care patient of Merrilee Seashore, MD . The community resource team was consulted for assistance with Caregiver Stress  Care guide performed the following interventions: sent an email to pt's daughter [name] with a list of in home care providers for out of pocket pay care. .  Follow Up Plan:  No further follow up planned at this time. The patient has been provided with needed resources.  [place], Care Management Phone: [phone] Email: [email or web address]

## 2020-07-17 NOTE — Progress Notes (Signed)
Cardiology Clinic Note   Patient Name: Marissa Jefferson Date of Encounter: 07/19/2020  Primary Care Provider:  Merrilee Seashore, MD Primary Cardiologist:  Sanda Klein, MD  Patient Profile    Marissa Jefferson 87 year old female presents the clinic today for follow-up evaluation of her acute on chronic diastolic CHF.  Past Medical History    Past Medical History:  Diagnosis Date  . Arthritis    "knees" (10/08/2013)  . Breast cancer ([place])    "left; I had 62 sessions of radiation"  . CAD (coronary artery disease), native coronary artery 10/06/2013   LAD 90%, CFX 90%, RCA 100% w/ collat  . Chronic combined systolic and diastolic CHF, NYHA class 2 ([place]) 09/2013  . CVA (cerebral vascular accident) ([place]) 1968   leaving no deficit  . DM2 (diabetes mellitus, type 2) ([place])   . GERD (gastroesophageal reflux disease)   . Hyperlipidemia   . Hypertension   . Hypothyroidism   . Ischemic dilated cardiomyopathy ([place]) 09/2013   EF 20-25% by echo  . NSVT (nonsustained ventricular tachycardia) ([place]) 09/2013  . OAB (overactive bladder)   . Osteopenia   . 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 BREAST LUMPECTOMY Left 1998  . BREAST LUMPECTOMY WITH AXILLARY LYMPH NODE DISSECTION Left 1998  . CARDIAC CATHETERIZATION  10/06/2013  . CATARACT EXTRACTION W/ INTRAOCULAR LENS  IMPLANT, BILATERAL Bilateral ~ 2009  . COLONOSCOPY  2005  . CORONARY ARTERY BYPASS GRAFT [name] 10/09/2013   Procedure: CORONARY ARTERY BYPASS GRAFTING (CABG) x 4 using left internal mammary artery and right saphenous leg vein using endoscope.;  Surgeon: Melrose Nakayama, MD;  Location: [place];  Service: Open Heart Surgery;  Laterality: [name];  . INTRAOPERATIVE TRANSESOPHAGEAL ECHOCARDIOGRAM [name] 10/09/2013   Procedure: INTRAOPERATIVE TRANSESOPHAGEAL ECHOCARDIOGRAM;  Surgeon: Melrose Nakayama, MD;  Location: [facility];  Service: Open Heart  Surgery;  Laterality: [name];  . LEFT AND RIGHT HEART CATHETERIZATION WITH CORONARY ANGIOGRAM [name] 10/06/2013   Procedure: LEFT AND RIGHT HEART CATHETERIZATION WITH CORONARY ANGIOGRAM;  Surgeon: Sanda Klein, MD;  Location: [place] CATH LAB;  Service: Cardiovascular;  Laterality: [name];  . TONSILLECTOMY AND ADENOIDECTOMY  1930's    Allergies  Allergies  Allergen Reactions  . Amiodarone Other (See Comments)    Bradycardia  . Crestor [Rosuvastatin] Nausea Only  . Sitagliptin Other (See Comments)    Caused urinary frequency    History of Present Illness    Ms. Sandate has a PMH of coronary artery disease status post CABG, hypertension, diabetes, hyperlipidemia, HFpEF with subacute RV failure, and mild AS.  She was admitted to the hospital 06/17/20.  She reported 2 weeks worth of shortness of breath and was found to be fluid volume overloaded with pulmonary edema.  Her BNP at that time was 900 and CXR showed pulmonary edema.  Her troponins were flat and ECG showed no ischemic changes.  She initially required BiPAP.  Echocardiogram showed LVEF of 55 to 60%, G2 DD, elevated LVEDP, RV volume overload.  She received IV diuresis and her weight decreased from 143 pounds down to 129 pounds.  Her renal function remained stable.  She presents to clinic today for follow-up evaluation states she feels well.  Her weight today is 124.8 pounds.  She reports that she has been weighing every morning after she voids.  She presents with her daughter who is a retired Software engineer.  We reviewed her echocardiogram that was completed in the hospital.  They expressed understanding.  We reviewed her medications and lab work.  She is taking 20 mg twice daily of furosemide.  She reports that she is following a strict low-sodium diet and has been drinking less than 48 ounces daily.  We talked about lower extremity support stockings.  She has SCDs at home that she will use instead.  She has been working with physical therapy.  I will  decrease her furosemide to 20 mg daily, have instructed her to contact the office with a weight increase of 3 pounds overnight or 5 pounds in 1 week.  We will give her the salty 6 diet sheet, have her increase her physical activity as tolerated, and follow-up with Dr. Sallyanne Kuster in 3 to 4 months.  Today she denies chest pain, shortness of breath, lower extremity edema, fatigue, palpitations, melena, hematuria, hemoptysis, diaphoresis, weakness, presyncope, syncope, orthopnea, and PND.   Home Medications    Prior to Admission medications   Medication Sig Start Date End Date Taking? Authorizing Provider  acetaminophen (TYLENOL) 500 MG tablet Take 1,000 mg by mouth See admin instructions. Take 1,000 mg by mouth in the morning & at bedtime and an additional 500 mg once a day as needed for pain/discomfort    [provider]  amLODipine (NORVASC) 10 MG tablet TAKE 1 TABLET BY MOUTH EVERY DAY Patient taking differently: Take 10 mg by mouth daily. 02/26/20   Croitoru, Mihai, MD  aspirin EC 81 MG tablet Take 1 tablet (81 mg total) by mouth daily. 01/23/19   Croitoru, Mihai, MD  atorvastatin (LIPITOR) 80 MG tablet Take 80 mg by mouth daily. 10/04/13   [provider]  B Complex-Biotin-FA (SUPER B-50 COMPLEX PO) Take 1 tablet by mouth daily before lunch.    [provider]  Biotin 5000 MCG SUBL Place 5,000 mcg under the tongue daily.    [provider]  carvedilol (COREG) 25 MG tablet TAKE 1 TABLET BY MOUTH TWICE A DAY WITH MEALS Patient taking differently: Take 25 mg by mouth 2 (two) times daily with a meal. 05/30/20   Croitoru, Mihai, MD  Cholecalciferol (VITAMIN D3) 125 MCG (5000 UT) CAPS Take 5,000 Units by mouth daily before lunch.    [provider]  Cyanocobalamin (VITAMIN B-12) 2500 MCG SUBL Place 2,500 mcg under the tongue daily before lunch.    [provider]  denosumab (PROLIA) 60 MG/ML SOSY injection Inject 60 mg into the skin every 6 (six)  months.    [provider]  docusate sodium (COLACE) 100 MG capsule Take 100 mg by mouth See admin instructions. Take 100 mg by mouth at bedtime and HOLD FOR DIARRHEA    [provider]  furosemide (LASIX) 40 MG tablet Take 1 tablet (40 mg total) by mouth 2 (two) times daily. 06/27/20 07/27/20  Damita Lack, MD  levothyroxine (SYNTHROID, LEVOTHROID) 150 MCG tablet Take 150 mcg by mouth daily before breakfast. 12/06/14   [provider]  lisinopril (ZESTRIL) 10 MG tablet TAKE 1 TABLET BY MOUTH EVERY DAY Patient taking differently: Take 10 mg by mouth daily. 10/02/19   Croitoru, Mihai, MD  Magnesium Oxide 400 (240 MG) MG TABS TAKE 1 TABLET TWICE DAILY. Patient taking differently: Take 400 mg by mouth 2 (two) times daily. 12/22/13   Croitoru, Mihai, MD  metFORMIN (GLUCOPHAGE) 500 MG tablet Take 1,000 mg by mouth 2 (two) times daily.    [provider]  potassium chloride (KLOR-CON) 10 MEQ tablet Take 1 tablet (10 mEq total)  by mouth daily. 06/27/20 07/27/20  Amin, Jeanella Flattery, MD  protein supplement shake (PREMIER PROTEIN) LIQD Take 237 mLs by mouth in the morning.    [provider]  solifenacin (VESICARE) 10 MG tablet Take 10 mg by mouth at bedtime.    [provider]  traMADol (ULTRAM) 50 MG tablet Take 50 mg by mouth 3 (three) times daily as needed for moderate pain. 05/24/20   [provider]  VOLTAREN 1 % GEL Apply 2 g topically daily as needed (for bilateral knee pain). 09/02/13   [provider]  potassium chloride (K-DUR) 10 MEQ tablet Take 0.5 tablets (5 mEq total) by mouth daily. 09/28/14 02/25/15  Croitoru, Dani Gobble, MD    Family History    Family History  Problem Relation Age of Onset  . Colon cancer Mother   . Cancer Father        ? type  . Heart attack Father   . Asthma Son   . Asthma Daughter    She indicated that her mother is deceased. She indicated that her father is deceased. She indicated that her  maternal grandmother is deceased. She indicated that her maternal grandfather is deceased. She indicated that her paternal grandmother is deceased. She indicated that her paternal grandfather is deceased. She indicated that the status of her daughter is unknown. She indicated that the status of her son is unknown.  Social History    Social History   Socioeconomic History  . Marital status: Widowed    Spouse name: Not on file  . Number of children: Not on file  . Years of education: Not on file  . Highest education level: Not on file  Occupational History  . Not on file  Tobacco Use  . Smoking status: Former Smoker    Packs/day: 0.25    Years: 3.00    Pack years: 0.75    Types: Cigarettes    Quit date: 03/12/1948    Years since quitting: 72.4  . Smokeless tobacco: Never Used  . Tobacco comment: smoked in college   Substance and Sexual Activity  . Alcohol use: No    Alcohol/week: 0.0 standard drinks  . Drug use: No  . Sexual activity: Not Currently  Other Topics Concern  . Not on file  Social History Narrative  . Not on file   Social Determinants of Health   Financial Resource Strain: Not on file  Food Insecurity: No Food Insecurity  . Worried About Charity fundraiser in the Last Year: Never true  . Ran Out of Food in the Last Year: Never true  Transportation Needs: No Transportation Needs  . Lack of Transportation (Medical): No  . Lack of Transportation (Non-Medical): No  Physical Activity: Not on file  Stress: Not on file  Social Connections: Not on file  Intimate Partner Violence: Not on file     Review of Systems    General:  No chills, fever, night sweats or weight changes.  Cardiovascular:  No chest pain, dyspnea on exertion, edema, orthopnea, palpitations, paroxysmal nocturnal dyspnea. Dermatological: No rash, lesions/masses Respiratory: No cough, dyspnea Urologic: No hematuria, dysuria Abdominal:   No nausea, vomiting, diarrhea, bright red blood per  rectum, melena, or hematemesis Neurologic:  No visual changes, wkns, changes in mental status. All other systems reviewed and are otherwise negative except as noted above.  Physical Exam    VS:  BP 116/60 (BP Location: Left Arm, Patient Position: Sitting, Cuff Size: Normal)   Pulse 61  Wt 124 lb 12.8 oz (56.6 kg)   SpO2 93%   BMI 20.14 kg/m  , BMI Body mass index is 20.14 kg/m. GEN: Well nourished, well developed, in no acute distress. HEENT: normal. Neck: Supple, no JVD, carotid bruits, or masses. Cardiac: RRR, no murmurs, rubs, or gallops. No clubbing, cyanosis, edema.  Radials/DP/PT 2+ and equal bilaterally.  Respiratory:  Respirations regular and unlabored, clear to auscultation bilaterally. GI: Soft, nontender, nondistended, BS + x 4. MS: no deformity or atrophy. Skin: warm and dry, no rash. Neuro:  Strength and sensation are intact. Psych: Normal affect.  Accessory Clinical Findings    Recent Labs: 06/17/2020: TSH 2.175 06/22/2020: B Natriuretic Peptide 546.5 06/27/2020: BUN 35; Creatinine, Ser 0.88; Hemoglobin 16.4; Magnesium 2.2; Platelets 204; Potassium 3.9; Sodium 138   Recent Lipid Panel    Component Value Date/Time   CHOL 135 05/10/2014 1440   TRIG 138 05/10/2014 1440   HDL 47 05/10/2014 1440   CHOLHDL 2.9 05/10/2014 1440   VLDL 28 05/10/2014 1440   LDLCALC 60 05/10/2014 1440    ECG personally reviewed by me today-none today. Echocardiogram 06/18/2020 IMPRESSIONS    1. Left ventricular ejection fraction, by estimation, is 55 to 60%. The  left ventricle has normal function. The left ventricle has no regional  wall motion abnormalities. There is moderate left ventricular hypertrophy.  Left ventricular diastolic  parameters are consistent with Grade II diastolic dysfunction  (pseudonormalization). Elevated left ventricular end-diastolic pressure.  There is the interventricular septum is flattened in diastole ('D' shaped  left ventricle), consistent with  right  ventricular volume overload and the interventricular septum is flattened  in systole, consistent with right ventricular pressure overload.  2. Right ventricular systolic function is severely reduced. The right  ventricular size is mildly enlarged. There is severely elevated pulmonary  artery systolic pressure. The estimated right ventricular systolic  pressure is 09.4 mmHg.  3. Left atrial size was mildly dilated.  4. Large pleural effusion in the left lateral region.  5. The mitral valve is grossly normal. Mild mitral valve regurgitation.  6. Tricuspid valve regurgitation is moderate.  7. The NCC and the LCC are fused. . The aortic valve is tricuspid. Aortic  valve regurgitation is not visualized. Mild aortic valve stenosis.  8. The inferior vena cava is dilated in size with <50% respiratory  variability, suggesting right atrial pressure of 15 mmHg.  Assessment & Plan   1.  Acute on chronic diastolic CHF- euvolemic today.  Discharge weight 129 pounds no increased DOE or activity intolerance.  Reports she has returned back to her baseline activities and is slowly increasing her physical activity.  On room air.  Echocardiogram 06/18/2020 showed EF 55-60%, G2 DD, elevated LVEDP, right ventricular volume overload.  Weight today 124.8 Continue carvedilol, furosemide Heart healthy low-sodium diet-salty 6 given Increase physical activity as tolerated Order BMP  Coronary artery disease-no chest pain today.  Status post CABG x4 on 10/09/2013.  No recent episodes of arm neck or back pain. Continue aspirin, amlodipine, atorvastatin, carvedilol, lisinopril, magnesium Heart healthy low-sodium diet-salty 6 given Increase physical activity as tolerated   Essential hypertension-BP today 116/60.  Well-controlled at home. Continue amlodipine, carvedilol, lisinopril Heart healthy low-sodium diet-salty 6 given Increase physical activity as tolerated  Hyperlipidemia-LDL 69 on  07/04/2020 Continue atorvastatin.   Heart healthy low-sodium high-fiber diet.   Increase physical activity as tolerated Follows with PCP  Disposition: Follow-up with Dr. Sallyanne Kuster in 3-4 months.    Jossie Ng. Ridley Dileo NP-C  07/19/2020, 12:27 PM [place] [address] Office [phone] Fax [phone]  Notice: This dictation was prepared with Dragon dictation along with smaller phrase technology. Any transcriptional errors that result from this process are unintentional and may not be corrected upon review.  I spent 15 minutes examining this patient, reviewing medications, and using patient centered shared decision making involving her cardiac care.  Prior to her visit I spent greater than 20 minutes reviewing her past medical history,  medications, and prior cardiac tests.

## 2020-07-18 DIAGNOSIS — I5020 Unspecified systolic (congestive) heart failure: Secondary | ICD-10-CM | POA: Diagnosis not present

## 2020-07-19 ENCOUNTER — Encounter: Payer: Self-pay | Admitting: General Practice

## 2020-07-19 ENCOUNTER — Ambulatory Visit: Payer: Medicare HMO | Admitting: General Practice

## 2020-07-19 ENCOUNTER — Other Ambulatory Visit: Payer: Self-pay

## 2020-07-19 VITALS — BP 116/60 | HR 61 | Wt 124.8 lb

## 2020-07-19 DIAGNOSIS — I5043 Acute on chronic combined systolic (congestive) and diastolic (congestive) heart failure: Secondary | ICD-10-CM | POA: Diagnosis not present

## 2020-07-19 DIAGNOSIS — I10 Essential (primary) hypertension: Secondary | ICD-10-CM | POA: Diagnosis not present

## 2020-07-19 DIAGNOSIS — E7800 Pure hypercholesterolemia, unspecified: Secondary | ICD-10-CM

## 2020-07-19 DIAGNOSIS — Z79899 Other long term (current) drug therapy: Secondary | ICD-10-CM

## 2020-07-19 DIAGNOSIS — I2510 Atherosclerotic heart disease of native coronary artery without angina pectoris: Secondary | ICD-10-CM | POA: Diagnosis not present

## 2020-07-19 MED ORDER — FUROSEMIDE 40 MG PO TABS: 20.0000 mg | ORAL_TABLET | Freq: Every day | ORAL | 6 refills | Status: DC

## 2020-07-19 NOTE — Patient Instructions (Signed)
Medication Instructions:  DECREASE FUROSEMIDE 20MG  DAILY *If you need a refill on your cardiac medications before your next appointment, please call your pharmacy*  Lab Work: BMET TODAY If you have labs (blood work) drawn today and your tests are completely normal, you will receive your results only by:  [place] (if you have MyChart) OR A paper copy in the mail.  If you have any lab test that is abnormal or we need to change your treatment, we will call you to review the results. You may go to any [facility] that is convenient for you however, we do have a lab in our office that is able to assist you. You DO NOT need an appointment for our lab. The lab is open 8:00am and closes at 4:00pm. Lunch 12:45 - 1:45pm.  Special Instructions TAKE AND LOG YOUR WEIGHT DAILY-CALL WITH WEIGHT GAIN >3lb/DAY OR 5lb/WEEK  PLEASE READ AND FOLLOW SALTY 6-ATTACHED-1,800 mg daily  PLEASE INCREASE PHYSICAL ACTIVITY AS TOLERATED  PLEASE PURCHASE AND WEAR COMPRESSION STOCKINGS DAILY AND TAKE OFF AT BEDTIME. Compression stockings are elastic socks that squeeze the legs. They help to increase blood flow to the legs and to decrease swelling in the legs from fluid retention, and reduce the chance of developing blood clots in the lower legs. Please put on in the AM when dressing and off at night when dressing for bed.  LET THEM KNOW THAT YOU NEED KNEE HIGH'S WITH COMPRESSION OF 15-20 mmhg.  [employer]  [place] ([place] [phone]); [place], [place] [address]; [phone]  EMAIL   [email or web address].  PLEASE MAKE SURE TO ELEVATE YOUR FEET & LEGS WHILE SITTING, THIS WILL HELP WITH THE SWELLING ALSO.    Follow-Up: Your next appointment:  3-4 month(s) In Person with Sanda Klein, MD OR IF UNAVAILABLE JESSE CLEAVER, FNP-C  At [facility], you and your health needs are our priority.  As part of our continuing mission to provide you with exceptional heart care, we have created designated  Provider Care Teams.  These Care Teams include your primary Cardiologist (physician) and Advanced Practice Providers (APPs -  Physician Assistants and Nurse Practitioners) who all work together to provide you with the care you need, when you need it.

## 2020-07-19 NOTE — Progress Notes (Signed)
Sounds better, thank you

## 2020-07-20 LAB — BASIC METABOLIC PANEL
BUN/Creatinine Ratio: 45 — ABNORMAL HIGH (ref 12–28)
BUN: 39 mg/dL — ABNORMAL HIGH (ref 10–36)
CO2: 25 mmol/L (ref 20–29)
Calcium: 10.9 mg/dL — ABNORMAL HIGH (ref 8.7–10.3)
Chloride: 95 mmol/L — ABNORMAL LOW (ref 96–106)
Creatinine, Ser: 0.86 mg/dL (ref 0.57–1.00)
Glucose: 148 mg/dL — ABNORMAL HIGH (ref 65–99)
Potassium: 4.8 mmol/L (ref 3.5–5.2)
Sodium: 140 mmol/L (ref 134–144)
eGFR: 63 mL/min/{1.73_m2} (ref >59)

## 2020-07-21 DIAGNOSIS — E1122 Type 2 diabetes mellitus with diabetic chronic kidney disease: Secondary | ICD-10-CM | POA: Diagnosis not present

## 2020-07-21 DIAGNOSIS — I5032 Chronic diastolic (congestive) heart failure: Secondary | ICD-10-CM | POA: Diagnosis not present

## 2020-07-21 DIAGNOSIS — I4891 Unspecified atrial fibrillation: Secondary | ICD-10-CM | POA: Diagnosis not present

## 2020-07-21 DIAGNOSIS — I051 Rheumatic mitral insufficiency: Secondary | ICD-10-CM | POA: Diagnosis not present

## 2020-07-21 DIAGNOSIS — M170 Bilateral primary osteoarthritis of knee: Secondary | ICD-10-CM | POA: Diagnosis not present

## 2020-07-21 DIAGNOSIS — J9601 Acute respiratory failure with hypoxia: Secondary | ICD-10-CM | POA: Diagnosis not present

## 2020-07-21 DIAGNOSIS — I25119 Atherosclerotic heart disease of native coronary artery with unspecified angina pectoris: Secondary | ICD-10-CM | POA: Diagnosis not present

## 2020-07-21 DIAGNOSIS — N189 Chronic kidney disease, unspecified: Secondary | ICD-10-CM | POA: Diagnosis not present

## 2020-07-21 DIAGNOSIS — I130 Hypertensive heart and chronic kidney disease with heart failure and stage 1 through stage 4 chronic kidney disease, or unspecified chronic kidney disease: Secondary | ICD-10-CM | POA: Diagnosis not present

## 2020-07-21 DIAGNOSIS — I272 Pulmonary hypertension, unspecified: Secondary | ICD-10-CM | POA: Diagnosis not present

## 2020-07-22 DIAGNOSIS — I25119 Atherosclerotic heart disease of native coronary artery with unspecified angina pectoris: Secondary | ICD-10-CM | POA: Diagnosis not present

## 2020-07-22 DIAGNOSIS — I272 Pulmonary hypertension, unspecified: Secondary | ICD-10-CM | POA: Diagnosis not present

## 2020-07-22 DIAGNOSIS — I4891 Unspecified atrial fibrillation: Secondary | ICD-10-CM | POA: Diagnosis not present

## 2020-07-22 DIAGNOSIS — I130 Hypertensive heart and chronic kidney disease with heart failure and stage 1 through stage 4 chronic kidney disease, or unspecified chronic kidney disease: Secondary | ICD-10-CM | POA: Diagnosis not present

## 2020-07-22 DIAGNOSIS — E1122 Type 2 diabetes mellitus with diabetic chronic kidney disease: Secondary | ICD-10-CM | POA: Diagnosis not present

## 2020-07-22 DIAGNOSIS — J9601 Acute respiratory failure with hypoxia: Secondary | ICD-10-CM | POA: Diagnosis not present

## 2020-07-22 DIAGNOSIS — M170 Bilateral primary osteoarthritis of knee: Secondary | ICD-10-CM | POA: Diagnosis not present

## 2020-07-22 DIAGNOSIS — I051 Rheumatic mitral insufficiency: Secondary | ICD-10-CM | POA: Diagnosis not present

## 2020-07-22 DIAGNOSIS — I5032 Chronic diastolic (congestive) heart failure: Secondary | ICD-10-CM | POA: Diagnosis not present

## 2020-07-22 DIAGNOSIS — N189 Chronic kidney disease, unspecified: Secondary | ICD-10-CM | POA: Diagnosis not present

## 2020-07-23 DIAGNOSIS — I4891 Unspecified atrial fibrillation: Secondary | ICD-10-CM | POA: Diagnosis not present

## 2020-07-23 DIAGNOSIS — E1122 Type 2 diabetes mellitus with diabetic chronic kidney disease: Secondary | ICD-10-CM | POA: Diagnosis not present

## 2020-07-23 DIAGNOSIS — I25119 Atherosclerotic heart disease of native coronary artery with unspecified angina pectoris: Secondary | ICD-10-CM | POA: Diagnosis not present

## 2020-07-23 DIAGNOSIS — I130 Hypertensive heart and chronic kidney disease with heart failure and stage 1 through stage 4 chronic kidney disease, or unspecified chronic kidney disease: Secondary | ICD-10-CM | POA: Diagnosis not present

## 2020-07-23 DIAGNOSIS — I051 Rheumatic mitral insufficiency: Secondary | ICD-10-CM | POA: Diagnosis not present

## 2020-07-23 DIAGNOSIS — I272 Pulmonary hypertension, unspecified: Secondary | ICD-10-CM | POA: Diagnosis not present

## 2020-07-23 DIAGNOSIS — N189 Chronic kidney disease, unspecified: Secondary | ICD-10-CM | POA: Diagnosis not present

## 2020-07-23 DIAGNOSIS — M170 Bilateral primary osteoarthritis of knee: Secondary | ICD-10-CM | POA: Diagnosis not present

## 2020-07-23 DIAGNOSIS — I5032 Chronic diastolic (congestive) heart failure: Secondary | ICD-10-CM | POA: Diagnosis not present

## 2020-07-23 DIAGNOSIS — J9601 Acute respiratory failure with hypoxia: Secondary | ICD-10-CM | POA: Diagnosis not present

## 2020-07-27 DIAGNOSIS — I509 Heart failure, unspecified: Secondary | ICD-10-CM | POA: Diagnosis not present

## 2020-07-27 DIAGNOSIS — E1122 Type 2 diabetes mellitus with diabetic chronic kidney disease: Secondary | ICD-10-CM | POA: Diagnosis not present

## 2020-07-27 DIAGNOSIS — M170 Bilateral primary osteoarthritis of knee: Secondary | ICD-10-CM | POA: Diagnosis not present

## 2020-07-27 DIAGNOSIS — I25119 Atherosclerotic heart disease of native coronary artery with unspecified angina pectoris: Secondary | ICD-10-CM | POA: Diagnosis not present

## 2020-07-27 DIAGNOSIS — I272 Pulmonary hypertension, unspecified: Secondary | ICD-10-CM | POA: Diagnosis not present

## 2020-07-27 DIAGNOSIS — I5032 Chronic diastolic (congestive) heart failure: Secondary | ICD-10-CM | POA: Diagnosis not present

## 2020-07-27 DIAGNOSIS — J9601 Acute respiratory failure with hypoxia: Secondary | ICD-10-CM | POA: Diagnosis not present

## 2020-07-27 DIAGNOSIS — I130 Hypertensive heart and chronic kidney disease with heart failure and stage 1 through stage 4 chronic kidney disease, or unspecified chronic kidney disease: Secondary | ICD-10-CM | POA: Diagnosis not present

## 2020-07-27 DIAGNOSIS — R918 Other nonspecific abnormal finding of lung field: Secondary | ICD-10-CM | POA: Diagnosis not present

## 2020-07-27 DIAGNOSIS — I4891 Unspecified atrial fibrillation: Secondary | ICD-10-CM | POA: Diagnosis not present

## 2020-07-27 DIAGNOSIS — N189 Chronic kidney disease, unspecified: Secondary | ICD-10-CM | POA: Diagnosis not present

## 2020-07-27 DIAGNOSIS — I051 Rheumatic mitral insufficiency: Secondary | ICD-10-CM | POA: Diagnosis not present

## 2020-07-29 DIAGNOSIS — I051 Rheumatic mitral insufficiency: Secondary | ICD-10-CM | POA: Diagnosis not present

## 2020-07-29 DIAGNOSIS — I5032 Chronic diastolic (congestive) heart failure: Secondary | ICD-10-CM | POA: Diagnosis not present

## 2020-07-29 DIAGNOSIS — M170 Bilateral primary osteoarthritis of knee: Secondary | ICD-10-CM | POA: Diagnosis not present

## 2020-07-29 DIAGNOSIS — I130 Hypertensive heart and chronic kidney disease with heart failure and stage 1 through stage 4 chronic kidney disease, or unspecified chronic kidney disease: Secondary | ICD-10-CM | POA: Diagnosis not present

## 2020-07-29 DIAGNOSIS — I272 Pulmonary hypertension, unspecified: Secondary | ICD-10-CM | POA: Diagnosis not present

## 2020-07-29 DIAGNOSIS — I25119 Atherosclerotic heart disease of native coronary artery with unspecified angina pectoris: Secondary | ICD-10-CM | POA: Diagnosis not present

## 2020-07-29 DIAGNOSIS — E1122 Type 2 diabetes mellitus with diabetic chronic kidney disease: Secondary | ICD-10-CM | POA: Diagnosis not present

## 2020-07-29 DIAGNOSIS — J9601 Acute respiratory failure with hypoxia: Secondary | ICD-10-CM | POA: Diagnosis not present

## 2020-07-29 DIAGNOSIS — I4891 Unspecified atrial fibrillation: Secondary | ICD-10-CM | POA: Diagnosis not present

## 2020-07-29 DIAGNOSIS — N189 Chronic kidney disease, unspecified: Secondary | ICD-10-CM | POA: Diagnosis not present

## 2020-08-01 DIAGNOSIS — I130 Hypertensive heart and chronic kidney disease with heart failure and stage 1 through stage 4 chronic kidney disease, or unspecified chronic kidney disease: Secondary | ICD-10-CM | POA: Diagnosis not present

## 2020-08-01 DIAGNOSIS — E1122 Type 2 diabetes mellitus with diabetic chronic kidney disease: Secondary | ICD-10-CM | POA: Diagnosis not present

## 2020-08-01 DIAGNOSIS — N189 Chronic kidney disease, unspecified: Secondary | ICD-10-CM | POA: Diagnosis not present

## 2020-08-01 DIAGNOSIS — J9601 Acute respiratory failure with hypoxia: Secondary | ICD-10-CM | POA: Diagnosis not present

## 2020-08-01 DIAGNOSIS — I051 Rheumatic mitral insufficiency: Secondary | ICD-10-CM | POA: Diagnosis not present

## 2020-08-01 DIAGNOSIS — I4891 Unspecified atrial fibrillation: Secondary | ICD-10-CM | POA: Diagnosis not present

## 2020-08-01 DIAGNOSIS — I5032 Chronic diastolic (congestive) heart failure: Secondary | ICD-10-CM | POA: Diagnosis not present

## 2020-08-01 DIAGNOSIS — I25119 Atherosclerotic heart disease of native coronary artery with unspecified angina pectoris: Secondary | ICD-10-CM | POA: Diagnosis not present

## 2020-08-01 DIAGNOSIS — M170 Bilateral primary osteoarthritis of knee: Secondary | ICD-10-CM | POA: Diagnosis not present

## 2020-08-01 DIAGNOSIS — I272 Pulmonary hypertension, unspecified: Secondary | ICD-10-CM | POA: Diagnosis not present

## 2020-08-02 DIAGNOSIS — I130 Hypertensive heart and chronic kidney disease with heart failure and stage 1 through stage 4 chronic kidney disease, or unspecified chronic kidney disease: Secondary | ICD-10-CM | POA: Diagnosis not present

## 2020-08-02 DIAGNOSIS — I5032 Chronic diastolic (congestive) heart failure: Secondary | ICD-10-CM | POA: Diagnosis not present

## 2020-08-02 DIAGNOSIS — E1122 Type 2 diabetes mellitus with diabetic chronic kidney disease: Secondary | ICD-10-CM | POA: Diagnosis not present

## 2020-08-02 DIAGNOSIS — J9601 Acute respiratory failure with hypoxia: Secondary | ICD-10-CM | POA: Diagnosis not present

## 2020-08-03 DIAGNOSIS — I130 Hypertensive heart and chronic kidney disease with heart failure and stage 1 through stage 4 chronic kidney disease, or unspecified chronic kidney disease: Secondary | ICD-10-CM | POA: Diagnosis not present

## 2020-08-03 DIAGNOSIS — I272 Pulmonary hypertension, unspecified: Secondary | ICD-10-CM | POA: Diagnosis not present

## 2020-08-03 DIAGNOSIS — M170 Bilateral primary osteoarthritis of knee: Secondary | ICD-10-CM | POA: Diagnosis not present

## 2020-08-03 DIAGNOSIS — I4891 Unspecified atrial fibrillation: Secondary | ICD-10-CM | POA: Diagnosis not present

## 2020-08-03 DIAGNOSIS — E1122 Type 2 diabetes mellitus with diabetic chronic kidney disease: Secondary | ICD-10-CM | POA: Diagnosis not present

## 2020-08-03 DIAGNOSIS — I25119 Atherosclerotic heart disease of native coronary artery with unspecified angina pectoris: Secondary | ICD-10-CM | POA: Diagnosis not present

## 2020-08-03 DIAGNOSIS — J9601 Acute respiratory failure with hypoxia: Secondary | ICD-10-CM | POA: Diagnosis not present

## 2020-08-03 DIAGNOSIS — I051 Rheumatic mitral insufficiency: Secondary | ICD-10-CM | POA: Diagnosis not present

## 2020-08-03 DIAGNOSIS — N189 Chronic kidney disease, unspecified: Secondary | ICD-10-CM | POA: Diagnosis not present

## 2020-08-03 DIAGNOSIS — I5032 Chronic diastolic (congestive) heart failure: Secondary | ICD-10-CM | POA: Diagnosis not present

## 2020-08-04 ENCOUNTER — Other Ambulatory Visit: Payer: Self-pay | Admitting: *Deleted

## 2020-08-04 DIAGNOSIS — J9601 Acute respiratory failure with hypoxia: Secondary | ICD-10-CM | POA: Diagnosis not present

## 2020-08-04 DIAGNOSIS — I4891 Unspecified atrial fibrillation: Secondary | ICD-10-CM | POA: Diagnosis not present

## 2020-08-04 DIAGNOSIS — E1122 Type 2 diabetes mellitus with diabetic chronic kidney disease: Secondary | ICD-10-CM | POA: Diagnosis not present

## 2020-08-04 DIAGNOSIS — I25119 Atherosclerotic heart disease of native coronary artery with unspecified angina pectoris: Secondary | ICD-10-CM | POA: Diagnosis not present

## 2020-08-04 DIAGNOSIS — I5032 Chronic diastolic (congestive) heart failure: Secondary | ICD-10-CM | POA: Diagnosis not present

## 2020-08-04 DIAGNOSIS — I130 Hypertensive heart and chronic kidney disease with heart failure and stage 1 through stage 4 chronic kidney disease, or unspecified chronic kidney disease: Secondary | ICD-10-CM | POA: Diagnosis not present

## 2020-08-04 DIAGNOSIS — M170 Bilateral primary osteoarthritis of knee: Secondary | ICD-10-CM | POA: Diagnosis not present

## 2020-08-04 DIAGNOSIS — I272 Pulmonary hypertension, unspecified: Secondary | ICD-10-CM | POA: Diagnosis not present

## 2020-08-04 DIAGNOSIS — I051 Rheumatic mitral insufficiency: Secondary | ICD-10-CM | POA: Diagnosis not present

## 2020-08-04 DIAGNOSIS — N189 Chronic kidney disease, unspecified: Secondary | ICD-10-CM | POA: Diagnosis not present

## 2020-08-04 NOTE — Patient Outreach (Signed)
[facility]) Care Management  08/04/2020  Dorethea Strubel 1924/05/29 [id number]   Telephone Assessment-Unsuccessful   RN attempted outreach call today however unsuccessful. Unable to leave a HIPAA approved voice message.   Pt will be an UPSTREAM however RN will attempt another call over the next week to verify she has been contacted  Raina Mina, RN Care Management Coordinator [facility] [phone]

## 2020-08-05 DIAGNOSIS — J9601 Acute respiratory failure with hypoxia: Secondary | ICD-10-CM | POA: Diagnosis not present

## 2020-08-05 DIAGNOSIS — I4891 Unspecified atrial fibrillation: Secondary | ICD-10-CM | POA: Diagnosis not present

## 2020-08-05 DIAGNOSIS — E1122 Type 2 diabetes mellitus with diabetic chronic kidney disease: Secondary | ICD-10-CM | POA: Diagnosis not present

## 2020-08-05 DIAGNOSIS — I5032 Chronic diastolic (congestive) heart failure: Secondary | ICD-10-CM | POA: Diagnosis not present

## 2020-08-05 DIAGNOSIS — N189 Chronic kidney disease, unspecified: Secondary | ICD-10-CM | POA: Diagnosis not present

## 2020-08-05 DIAGNOSIS — I25119 Atherosclerotic heart disease of native coronary artery with unspecified angina pectoris: Secondary | ICD-10-CM | POA: Diagnosis not present

## 2020-08-05 DIAGNOSIS — I272 Pulmonary hypertension, unspecified: Secondary | ICD-10-CM | POA: Diagnosis not present

## 2020-08-05 DIAGNOSIS — I130 Hypertensive heart and chronic kidney disease with heart failure and stage 1 through stage 4 chronic kidney disease, or unspecified chronic kidney disease: Secondary | ICD-10-CM | POA: Diagnosis not present

## 2020-08-05 DIAGNOSIS — I051 Rheumatic mitral insufficiency: Secondary | ICD-10-CM | POA: Diagnosis not present

## 2020-08-05 DIAGNOSIS — M170 Bilateral primary osteoarthritis of knee: Secondary | ICD-10-CM | POA: Diagnosis not present

## 2020-08-09 DIAGNOSIS — I4891 Unspecified atrial fibrillation: Secondary | ICD-10-CM | POA: Diagnosis not present

## 2020-08-09 DIAGNOSIS — E1122 Type 2 diabetes mellitus with diabetic chronic kidney disease: Secondary | ICD-10-CM | POA: Diagnosis not present

## 2020-08-09 DIAGNOSIS — I5032 Chronic diastolic (congestive) heart failure: Secondary | ICD-10-CM | POA: Diagnosis not present

## 2020-08-09 DIAGNOSIS — J9601 Acute respiratory failure with hypoxia: Secondary | ICD-10-CM | POA: Diagnosis not present

## 2020-08-09 DIAGNOSIS — I25119 Atherosclerotic heart disease of native coronary artery with unspecified angina pectoris: Secondary | ICD-10-CM | POA: Diagnosis not present

## 2020-08-09 DIAGNOSIS — N189 Chronic kidney disease, unspecified: Secondary | ICD-10-CM | POA: Diagnosis not present

## 2020-08-09 DIAGNOSIS — I130 Hypertensive heart and chronic kidney disease with heart failure and stage 1 through stage 4 chronic kidney disease, or unspecified chronic kidney disease: Secondary | ICD-10-CM | POA: Diagnosis not present

## 2020-08-09 DIAGNOSIS — I051 Rheumatic mitral insufficiency: Secondary | ICD-10-CM | POA: Diagnosis not present

## 2020-08-09 DIAGNOSIS — I272 Pulmonary hypertension, unspecified: Secondary | ICD-10-CM | POA: Diagnosis not present

## 2020-08-09 DIAGNOSIS — M170 Bilateral primary osteoarthritis of knee: Secondary | ICD-10-CM | POA: Diagnosis not present

## 2020-08-11 DIAGNOSIS — N189 Chronic kidney disease, unspecified: Secondary | ICD-10-CM | POA: Diagnosis not present

## 2020-08-11 DIAGNOSIS — I4891 Unspecified atrial fibrillation: Secondary | ICD-10-CM | POA: Diagnosis not present

## 2020-08-11 DIAGNOSIS — M170 Bilateral primary osteoarthritis of knee: Secondary | ICD-10-CM | POA: Diagnosis not present

## 2020-08-11 DIAGNOSIS — I25119 Atherosclerotic heart disease of native coronary artery with unspecified angina pectoris: Secondary | ICD-10-CM | POA: Diagnosis not present

## 2020-08-11 DIAGNOSIS — J9601 Acute respiratory failure with hypoxia: Secondary | ICD-10-CM | POA: Diagnosis not present

## 2020-08-11 DIAGNOSIS — I272 Pulmonary hypertension, unspecified: Secondary | ICD-10-CM | POA: Diagnosis not present

## 2020-08-11 DIAGNOSIS — I051 Rheumatic mitral insufficiency: Secondary | ICD-10-CM | POA: Diagnosis not present

## 2020-08-11 DIAGNOSIS — I5032 Chronic diastolic (congestive) heart failure: Secondary | ICD-10-CM | POA: Diagnosis not present

## 2020-08-11 DIAGNOSIS — I130 Hypertensive heart and chronic kidney disease with heart failure and stage 1 through stage 4 chronic kidney disease, or unspecified chronic kidney disease: Secondary | ICD-10-CM | POA: Diagnosis not present

## 2020-08-11 DIAGNOSIS — E1122 Type 2 diabetes mellitus with diabetic chronic kidney disease: Secondary | ICD-10-CM | POA: Diagnosis not present

## 2020-08-12 ENCOUNTER — Other Ambulatory Visit: Payer: Self-pay | Admitting: *Deleted

## 2020-08-12 DIAGNOSIS — I272 Pulmonary hypertension, unspecified: Secondary | ICD-10-CM | POA: Diagnosis not present

## 2020-08-12 DIAGNOSIS — E1122 Type 2 diabetes mellitus with diabetic chronic kidney disease: Secondary | ICD-10-CM | POA: Diagnosis not present

## 2020-08-12 DIAGNOSIS — J9601 Acute respiratory failure with hypoxia: Secondary | ICD-10-CM | POA: Diagnosis not present

## 2020-08-12 DIAGNOSIS — M170 Bilateral primary osteoarthritis of knee: Secondary | ICD-10-CM | POA: Diagnosis not present

## 2020-08-12 DIAGNOSIS — I5032 Chronic diastolic (congestive) heart failure: Secondary | ICD-10-CM | POA: Diagnosis not present

## 2020-08-12 DIAGNOSIS — I051 Rheumatic mitral insufficiency: Secondary | ICD-10-CM | POA: Diagnosis not present

## 2020-08-12 DIAGNOSIS — I130 Hypertensive heart and chronic kidney disease with heart failure and stage 1 through stage 4 chronic kidney disease, or unspecified chronic kidney disease: Secondary | ICD-10-CM | POA: Diagnosis not present

## 2020-08-12 DIAGNOSIS — I4891 Unspecified atrial fibrillation: Secondary | ICD-10-CM | POA: Diagnosis not present

## 2020-08-12 DIAGNOSIS — I25119 Atherosclerotic heart disease of native coronary artery with unspecified angina pectoris: Secondary | ICD-10-CM | POA: Diagnosis not present

## 2020-08-12 DIAGNOSIS — N189 Chronic kidney disease, unspecified: Secondary | ICD-10-CM | POA: Diagnosis not present

## 2020-08-12 NOTE — Patient Outreach (Signed)
[facility]) Care Management  08/12/2020  Marissa Jefferson [birth year] [id number]   Telephone Assessment-Unsuccessful   RN spoke briefly with pt and introduced THN once again verifying the correct pt. Inquired if this was a good time to speak however is indicated it was not and disconnected the line before another appointments could be verified.   Will reschedule another outreach call over the next week.  Raina Mina, RN Care Management Coordinator [place] Office [phone]

## 2020-08-15 DIAGNOSIS — I272 Pulmonary hypertension, unspecified: Secondary | ICD-10-CM | POA: Diagnosis not present

## 2020-08-15 DIAGNOSIS — M170 Bilateral primary osteoarthritis of knee: Secondary | ICD-10-CM | POA: Diagnosis not present

## 2020-08-15 DIAGNOSIS — N189 Chronic kidney disease, unspecified: Secondary | ICD-10-CM | POA: Diagnosis not present

## 2020-08-15 DIAGNOSIS — J9601 Acute respiratory failure with hypoxia: Secondary | ICD-10-CM | POA: Diagnosis not present

## 2020-08-15 DIAGNOSIS — I051 Rheumatic mitral insufficiency: Secondary | ICD-10-CM | POA: Diagnosis not present

## 2020-08-15 DIAGNOSIS — I5032 Chronic diastolic (congestive) heart failure: Secondary | ICD-10-CM | POA: Diagnosis not present

## 2020-08-15 DIAGNOSIS — I4891 Unspecified atrial fibrillation: Secondary | ICD-10-CM | POA: Diagnosis not present

## 2020-08-15 DIAGNOSIS — E1122 Type 2 diabetes mellitus with diabetic chronic kidney disease: Secondary | ICD-10-CM | POA: Diagnosis not present

## 2020-08-15 DIAGNOSIS — I130 Hypertensive heart and chronic kidney disease with heart failure and stage 1 through stage 4 chronic kidney disease, or unspecified chronic kidney disease: Secondary | ICD-10-CM | POA: Diagnosis not present

## 2020-08-15 DIAGNOSIS — I25119 Atherosclerotic heart disease of native coronary artery with unspecified angina pectoris: Secondary | ICD-10-CM | POA: Diagnosis not present

## 2020-08-16 ENCOUNTER — Other Ambulatory Visit: Payer: Self-pay | Admitting: *Deleted

## 2020-08-16 NOTE — Patient Outreach (Signed)
[place]) Care Management  08/16/2020  Marissa Jefferson 02-08-1925 [id number]  Telephone Assessment-Successful-HF  Spoke with pt today with no related symptoms and plan of care reviewed and discussed with noted updates. No acute needs at this time as pt continues her daily weights  With yesterday at 120 lbs and today at 118 lbs with no related symptoms. Will continue to encouraged adherence with all that was discussed today.   Will follow up next month with ongoing management of care related to pt's ongoing HF.  Goals Addressed            This Visit's Progress   . THN-Comorbidities Identified and Managed   On track    Follow up Date: 09/21/2020 Timeframe:  Long-Range Goal Priority:  Medium Start Date:    07/04/2020                         Expected End Date: 10/07/2020                       Evidence-based guidance:   Assess and address signs/symptoms of comorbidity, including dyslipidemia, diabetes, iron deficiency, gout, arthritis, dysrhythmia, hypertension, cachexia, coronary artery disease, kidney dysfunction and lung disease.   Prepare patient for laboratory and diagnostic exams based on risk and presentation.   Prepare for use of pharmacologic therapy that may include statin, angiotensin converting enzyme (ACE) inhibitor, angiotensin receptor blocker (ARB), beta-blocker, digoxin, antidysrhythmic, diuretic or omega-3 fatty acid.   Monitor side effects and anticipate need for periodic adjustments.   Prepare patient for potential invasive treatment, such as implantable cardioverter-defibrillator, cardiac resynchronization therapy or heart transplant as disease progresses.   Barriers: Health Behaviors Knowledge  Notes:  4/25-Verified all other comorbidities are managed well and continue to encourage with ongoing medication regimen and attendance to all medical appointments post-operatively.     [facility] and Keep All Appointments   On track    Follow Up Date  09/21/2020 Timeframe:  Short-Term Goal Priority:  Medium Start Date:  07/04/2020                           Expected End Date:  10/07/2020                   - arrange a ride through an agency 1 week before appointment - ask family or friend for a ride - call to cancel if needed - keep a calendar with prescription refill dates - keep a calendar with appointment dates   Barriers: Health Behaviors  Why is this important?    Part of staying healthy is seeing the doctor for follow-up care.   If you forget your appointments, there are some things you can do to stay on track.    Notes:  6/7-Verified pt continue to have sufficient transportation to all medical appointments with no delays or related issues.  Pt continue to take all prescribed medications with no needed refills. Will continue to encourage adherence with all above goals and interventions. 4/25-Pt's caregiver reports pt currently has 24/7 family/friends in the home and sufficient transportation available for all medical appointments. Will educate on additional transportation services with [employer] if needed in the future.       Raina Mina, RN Care Management Coordinator [place] Office [phone]

## 2020-08-18 DIAGNOSIS — I5020 Unspecified systolic (congestive) heart failure: Secondary | ICD-10-CM | POA: Diagnosis not present

## 2020-08-19 DIAGNOSIS — I5032 Chronic diastolic (congestive) heart failure: Secondary | ICD-10-CM | POA: Diagnosis not present

## 2020-08-19 DIAGNOSIS — I272 Pulmonary hypertension, unspecified: Secondary | ICD-10-CM | POA: Diagnosis not present

## 2020-08-19 DIAGNOSIS — E1122 Type 2 diabetes mellitus with diabetic chronic kidney disease: Secondary | ICD-10-CM | POA: Diagnosis not present

## 2020-08-19 DIAGNOSIS — I130 Hypertensive heart and chronic kidney disease with heart failure and stage 1 through stage 4 chronic kidney disease, or unspecified chronic kidney disease: Secondary | ICD-10-CM | POA: Diagnosis not present

## 2020-08-19 DIAGNOSIS — M170 Bilateral primary osteoarthritis of knee: Secondary | ICD-10-CM | POA: Diagnosis not present

## 2020-08-19 DIAGNOSIS — J9601 Acute respiratory failure with hypoxia: Secondary | ICD-10-CM | POA: Diagnosis not present

## 2020-08-19 DIAGNOSIS — I4891 Unspecified atrial fibrillation: Secondary | ICD-10-CM | POA: Diagnosis not present

## 2020-08-19 DIAGNOSIS — I25119 Atherosclerotic heart disease of native coronary artery with unspecified angina pectoris: Secondary | ICD-10-CM | POA: Diagnosis not present

## 2020-08-19 DIAGNOSIS — I051 Rheumatic mitral insufficiency: Secondary | ICD-10-CM | POA: Diagnosis not present

## 2020-08-19 DIAGNOSIS — N189 Chronic kidney disease, unspecified: Secondary | ICD-10-CM | POA: Diagnosis not present

## 2020-08-22 DIAGNOSIS — J9601 Acute respiratory failure with hypoxia: Secondary | ICD-10-CM | POA: Diagnosis not present

## 2020-08-22 DIAGNOSIS — I272 Pulmonary hypertension, unspecified: Secondary | ICD-10-CM | POA: Diagnosis not present

## 2020-08-22 DIAGNOSIS — N189 Chronic kidney disease, unspecified: Secondary | ICD-10-CM | POA: Diagnosis not present

## 2020-08-22 DIAGNOSIS — I5032 Chronic diastolic (congestive) heart failure: Secondary | ICD-10-CM | POA: Diagnosis not present

## 2020-08-22 DIAGNOSIS — I25119 Atherosclerotic heart disease of native coronary artery with unspecified angina pectoris: Secondary | ICD-10-CM | POA: Diagnosis not present

## 2020-08-22 DIAGNOSIS — E1122 Type 2 diabetes mellitus with diabetic chronic kidney disease: Secondary | ICD-10-CM | POA: Diagnosis not present

## 2020-08-22 DIAGNOSIS — I4891 Unspecified atrial fibrillation: Secondary | ICD-10-CM | POA: Diagnosis not present

## 2020-08-22 DIAGNOSIS — I130 Hypertensive heart and chronic kidney disease with heart failure and stage 1 through stage 4 chronic kidney disease, or unspecified chronic kidney disease: Secondary | ICD-10-CM | POA: Diagnosis not present

## 2020-08-22 DIAGNOSIS — I051 Rheumatic mitral insufficiency: Secondary | ICD-10-CM | POA: Diagnosis not present

## 2020-08-22 DIAGNOSIS — M170 Bilateral primary osteoarthritis of knee: Secondary | ICD-10-CM | POA: Diagnosis not present

## 2020-08-24 DIAGNOSIS — E1122 Type 2 diabetes mellitus with diabetic chronic kidney disease: Secondary | ICD-10-CM | POA: Diagnosis not present

## 2020-08-24 DIAGNOSIS — I4891 Unspecified atrial fibrillation: Secondary | ICD-10-CM | POA: Diagnosis not present

## 2020-08-24 DIAGNOSIS — I051 Rheumatic mitral insufficiency: Secondary | ICD-10-CM | POA: Diagnosis not present

## 2020-08-24 DIAGNOSIS — N189 Chronic kidney disease, unspecified: Secondary | ICD-10-CM | POA: Diagnosis not present

## 2020-08-24 DIAGNOSIS — J9601 Acute respiratory failure with hypoxia: Secondary | ICD-10-CM | POA: Diagnosis not present

## 2020-08-24 DIAGNOSIS — M170 Bilateral primary osteoarthritis of knee: Secondary | ICD-10-CM | POA: Diagnosis not present

## 2020-08-24 DIAGNOSIS — I5032 Chronic diastolic (congestive) heart failure: Secondary | ICD-10-CM | POA: Diagnosis not present

## 2020-08-24 DIAGNOSIS — I130 Hypertensive heart and chronic kidney disease with heart failure and stage 1 through stage 4 chronic kidney disease, or unspecified chronic kidney disease: Secondary | ICD-10-CM | POA: Diagnosis not present

## 2020-08-24 DIAGNOSIS — I25119 Atherosclerotic heart disease of native coronary artery with unspecified angina pectoris: Secondary | ICD-10-CM | POA: Diagnosis not present

## 2020-08-24 DIAGNOSIS — I272 Pulmonary hypertension, unspecified: Secondary | ICD-10-CM | POA: Diagnosis not present

## 2020-08-26 DIAGNOSIS — J9601 Acute respiratory failure with hypoxia: Secondary | ICD-10-CM | POA: Diagnosis not present

## 2020-08-26 DIAGNOSIS — E1122 Type 2 diabetes mellitus with diabetic chronic kidney disease: Secondary | ICD-10-CM | POA: Diagnosis not present

## 2020-08-26 DIAGNOSIS — N189 Chronic kidney disease, unspecified: Secondary | ICD-10-CM | POA: Diagnosis not present

## 2020-08-26 DIAGNOSIS — I051 Rheumatic mitral insufficiency: Secondary | ICD-10-CM | POA: Diagnosis not present

## 2020-08-26 DIAGNOSIS — I5032 Chronic diastolic (congestive) heart failure: Secondary | ICD-10-CM | POA: Diagnosis not present

## 2020-08-26 DIAGNOSIS — I25119 Atherosclerotic heart disease of native coronary artery with unspecified angina pectoris: Secondary | ICD-10-CM | POA: Diagnosis not present

## 2020-08-26 DIAGNOSIS — I4891 Unspecified atrial fibrillation: Secondary | ICD-10-CM | POA: Diagnosis not present

## 2020-08-26 DIAGNOSIS — I272 Pulmonary hypertension, unspecified: Secondary | ICD-10-CM | POA: Diagnosis not present

## 2020-08-26 DIAGNOSIS — M170 Bilateral primary osteoarthritis of knee: Secondary | ICD-10-CM | POA: Diagnosis not present

## 2020-08-26 DIAGNOSIS — I130 Hypertensive heart and chronic kidney disease with heart failure and stage 1 through stage 4 chronic kidney disease, or unspecified chronic kidney disease: Secondary | ICD-10-CM | POA: Diagnosis not present

## 2020-08-29 DIAGNOSIS — I25119 Atherosclerotic heart disease of native coronary artery with unspecified angina pectoris: Secondary | ICD-10-CM | POA: Diagnosis not present

## 2020-08-29 DIAGNOSIS — I5032 Chronic diastolic (congestive) heart failure: Secondary | ICD-10-CM | POA: Diagnosis not present

## 2020-08-29 DIAGNOSIS — E1122 Type 2 diabetes mellitus with diabetic chronic kidney disease: Secondary | ICD-10-CM | POA: Diagnosis not present

## 2020-08-29 DIAGNOSIS — M170 Bilateral primary osteoarthritis of knee: Secondary | ICD-10-CM | POA: Diagnosis not present

## 2020-08-29 DIAGNOSIS — I051 Rheumatic mitral insufficiency: Secondary | ICD-10-CM | POA: Diagnosis not present

## 2020-08-29 DIAGNOSIS — J9601 Acute respiratory failure with hypoxia: Secondary | ICD-10-CM | POA: Diagnosis not present

## 2020-08-29 DIAGNOSIS — N189 Chronic kidney disease, unspecified: Secondary | ICD-10-CM | POA: Diagnosis not present

## 2020-08-29 DIAGNOSIS — I4891 Unspecified atrial fibrillation: Secondary | ICD-10-CM | POA: Diagnosis not present

## 2020-08-29 DIAGNOSIS — I130 Hypertensive heart and chronic kidney disease with heart failure and stage 1 through stage 4 chronic kidney disease, or unspecified chronic kidney disease: Secondary | ICD-10-CM | POA: Diagnosis not present

## 2020-08-29 DIAGNOSIS — I272 Pulmonary hypertension, unspecified: Secondary | ICD-10-CM | POA: Diagnosis not present

## 2020-09-02 DIAGNOSIS — M170 Bilateral primary osteoarthritis of knee: Secondary | ICD-10-CM | POA: Diagnosis not present

## 2020-09-02 DIAGNOSIS — I130 Hypertensive heart and chronic kidney disease with heart failure and stage 1 through stage 4 chronic kidney disease, or unspecified chronic kidney disease: Secondary | ICD-10-CM | POA: Diagnosis not present

## 2020-09-02 DIAGNOSIS — N189 Chronic kidney disease, unspecified: Secondary | ICD-10-CM | POA: Diagnosis not present

## 2020-09-02 DIAGNOSIS — I25119 Atherosclerotic heart disease of native coronary artery with unspecified angina pectoris: Secondary | ICD-10-CM | POA: Diagnosis not present

## 2020-09-02 DIAGNOSIS — I4891 Unspecified atrial fibrillation: Secondary | ICD-10-CM | POA: Diagnosis not present

## 2020-09-02 DIAGNOSIS — I272 Pulmonary hypertension, unspecified: Secondary | ICD-10-CM | POA: Diagnosis not present

## 2020-09-02 DIAGNOSIS — E1122 Type 2 diabetes mellitus with diabetic chronic kidney disease: Secondary | ICD-10-CM | POA: Diagnosis not present

## 2020-09-02 DIAGNOSIS — J9601 Acute respiratory failure with hypoxia: Secondary | ICD-10-CM | POA: Diagnosis not present

## 2020-09-02 DIAGNOSIS — I5022 Chronic systolic (congestive) heart failure: Secondary | ICD-10-CM | POA: Diagnosis not present

## 2020-09-02 DIAGNOSIS — I5032 Chronic diastolic (congestive) heart failure: Secondary | ICD-10-CM | POA: Diagnosis not present

## 2020-09-02 DIAGNOSIS — I051 Rheumatic mitral insufficiency: Secondary | ICD-10-CM | POA: Diagnosis not present

## 2020-09-05 DIAGNOSIS — I5032 Chronic diastolic (congestive) heart failure: Secondary | ICD-10-CM | POA: Diagnosis not present

## 2020-09-05 DIAGNOSIS — I4891 Unspecified atrial fibrillation: Secondary | ICD-10-CM | POA: Diagnosis not present

## 2020-09-05 DIAGNOSIS — I130 Hypertensive heart and chronic kidney disease with heart failure and stage 1 through stage 4 chronic kidney disease, or unspecified chronic kidney disease: Secondary | ICD-10-CM | POA: Diagnosis not present

## 2020-09-05 DIAGNOSIS — I051 Rheumatic mitral insufficiency: Secondary | ICD-10-CM | POA: Diagnosis not present

## 2020-09-05 DIAGNOSIS — J9601 Acute respiratory failure with hypoxia: Secondary | ICD-10-CM | POA: Diagnosis not present

## 2020-09-05 DIAGNOSIS — I272 Pulmonary hypertension, unspecified: Secondary | ICD-10-CM | POA: Diagnosis not present

## 2020-09-05 DIAGNOSIS — I25119 Atherosclerotic heart disease of native coronary artery with unspecified angina pectoris: Secondary | ICD-10-CM | POA: Diagnosis not present

## 2020-09-05 DIAGNOSIS — N189 Chronic kidney disease, unspecified: Secondary | ICD-10-CM | POA: Diagnosis not present

## 2020-09-05 DIAGNOSIS — M170 Bilateral primary osteoarthritis of knee: Secondary | ICD-10-CM | POA: Diagnosis not present

## 2020-09-05 DIAGNOSIS — E1122 Type 2 diabetes mellitus with diabetic chronic kidney disease: Secondary | ICD-10-CM | POA: Diagnosis not present

## 2020-09-07 DIAGNOSIS — E1122 Type 2 diabetes mellitus with diabetic chronic kidney disease: Secondary | ICD-10-CM | POA: Diagnosis not present

## 2020-09-07 DIAGNOSIS — I129 Hypertensive chronic kidney disease with stage 1 through stage 4 chronic kidney disease, or unspecified chronic kidney disease: Secondary | ICD-10-CM | POA: Diagnosis not present

## 2020-09-07 DIAGNOSIS — N1831 Chronic kidney disease, stage 3a: Secondary | ICD-10-CM | POA: Diagnosis not present

## 2020-09-07 DIAGNOSIS — E1165 Type 2 diabetes mellitus with hyperglycemia: Secondary | ICD-10-CM | POA: Diagnosis not present

## 2020-09-07 DIAGNOSIS — I2510 Atherosclerotic heart disease of native coronary artery without angina pectoris: Secondary | ICD-10-CM | POA: Diagnosis not present

## 2020-09-07 DIAGNOSIS — K5901 Slow transit constipation: Secondary | ICD-10-CM | POA: Diagnosis not present

## 2020-09-07 DIAGNOSIS — I5022 Chronic systolic (congestive) heart failure: Secondary | ICD-10-CM | POA: Diagnosis not present

## 2020-09-07 DIAGNOSIS — E782 Mixed hyperlipidemia: Secondary | ICD-10-CM | POA: Diagnosis not present

## 2020-09-17 DIAGNOSIS — I5020 Unspecified systolic (congestive) heart failure: Secondary | ICD-10-CM | POA: Diagnosis not present

## 2020-09-21 ENCOUNTER — Ambulatory Visit: Payer: Self-pay | Admitting: *Deleted

## 2020-10-05 ENCOUNTER — Other Ambulatory Visit: Payer: Self-pay | Admitting: *Deleted

## 2020-10-05 ENCOUNTER — Encounter: Payer: Self-pay | Admitting: *Deleted

## 2020-10-05 NOTE — Patient Outreach (Signed)
[name]) Care Management  10/05/2020  Marissa Jefferson 1924/11/09 [id number]   Telephone Assessment-Unsuccessful  RN attempted outreach call today however unsuccessful. RN unable to leave a message.  Will follow up once again over the next week with ongoing care management services and continue to address pt's management of care related to her HF.  Raina Mina, RN Care Management Coordinator [place] Office [phone]

## 2020-10-06 DIAGNOSIS — M170 Bilateral primary osteoarthritis of knee: Secondary | ICD-10-CM | POA: Diagnosis not present

## 2020-10-06 DIAGNOSIS — M25561 Pain in right knee: Secondary | ICD-10-CM | POA: Diagnosis not present

## 2020-10-06 DIAGNOSIS — M1990 Unspecified osteoarthritis, unspecified site: Secondary | ICD-10-CM | POA: Diagnosis not present

## 2020-10-10 ENCOUNTER — Other Ambulatory Visit: Payer: Self-pay | Admitting: *Deleted

## 2020-10-10 NOTE — Patient Outreach (Signed)
[place] [facility]) Care Management  10/10/2020  Marissa Jefferson December 15, 1924 [id number]  Telephone Assessment-Successful-HF  Pt continue to do well with no acute issues or related needs. Plan of care reviewed and discussed with noted updates. No needs at this time as RN will follow in one month with any ongoing needs and update the plan of care at this time.   Will scheduled follow and continue to communicate with the patient's provider.   Goals Addressed             This Visit's Progress    THN-Comorbidities Identified and Managed   On track    Follow up Date: 11/10/2020 Timeframe:  Long-Range Goal Priority:  Medium Start Date:    07/04/2020                         Expected End Date: 03/10/2021                       Evidence-based guidance:  Assess and address signs/symptoms of comorbidity, including dyslipidemia, diabetes, iron deficiency, gout, arthritis, dysrhythmia, hypertension, cachexia, coronary artery disease, kidney dysfunction and lung disease.  Prepare patient for laboratory and diagnostic exams based on risk and presentation.  Prepare for use of pharmacologic therapy that may include statin, angiotensin converting enzyme (ACE) inhibitor, angiotensin receptor blocker (ARB), beta-blocker, digoxin, antidysrhythmic, diuretic or omega-3 fatty acid.  Monitor side effects and anticipate need for periodic adjustments.  Prepare patient for potential invasive treatment, such as implantable cardioverter-defibrillator, cardiac resynchronization therapy or heart transplant as disease progresses.   Barriers: Health Behaviors Knowledge  Notes:  8/1 Pt reports she is managing her ongoing medical issues with no acute problems and has needed assistance for errands out in the community along with family support. No acute issues reported as pt continue to manager her HF with daily weights reported around 120-121 lbs with no related symptoms. 4/25-Verified all other comorbidities are  managed well and continue to encourage with ongoing medication regimen and attendance to all medical appointments post-operatively.      [facility]-Make and Keep All Appointments   On track    Follow Up Date 11/10/2020 Timeframe:  Short-Term Goal Priority:  Medium Start Date:  07/04/2020                           Expected End Date:  12/09/2020                   - arrange a ride through an agency 1 week before appointment - ask family or friend for a ride - call to cancel if needed - keep a calendar with prescription refill dates - keep a calendar with appointment dates   Barriers: Health Behaviors  Why is this important?   Part of staying healthy is seeing the doctor for follow-up care.  If you forget your appointments, there are some things you can do to stay on track.    Notes: 8/1- Pt reports she is keeping all her medical appointments with no delays or missed appointments. 6/7-Verified pt continue to have sufficient transportation to all medical appointments with no delays or related issues.  Pt continue to take all prescribed medications with no needed refills. Will continue to encourage adherence with all above goals and interventions. 4/25-Pt's caregiver reports pt currently has 24/7 family/friends in the home and sufficient transportation available for all medical appointments. Will educate on additional  transportation services with [employer] if needed in the future.         Raina Mina, RN Care Management Coordinator [place] Office [phone]

## 2020-10-12 ENCOUNTER — Other Ambulatory Visit: Payer: Self-pay | Admitting: Cardiovascular Disease

## 2020-10-18 DIAGNOSIS — I5020 Unspecified systolic (congestive) heart failure: Secondary | ICD-10-CM | POA: Diagnosis not present

## 2020-11-10 ENCOUNTER — Other Ambulatory Visit: Payer: Self-pay | Admitting: *Deleted

## 2020-11-10 NOTE — Patient Outreach (Signed)
[facility]) Care Management  11/10/2020  Marissa Jefferson [birth year] [id number]  Telephone Assessment-Successful-HF  RN spoke with pt today an verified her ongoing management of care. Pt continue to do well with no acute issues reported. Pt remains at her baseline weight and denies any residual symptoms of HF. Remains on her 2 liters 24/7 with no acute needs. Pt has an appointment with her CAD provider on next Thursday and her daughter will accompany her on that scheduled appointment.  Plan of care discussed with noted updates within the plan of care. Will follow up next month with ongoing care management services.   Goals Addressed             This Visit's Progress    THN-Comorbidities Identified and Managed   On track    Follow up Date: 12/12/2020 Timeframe:  Long-Range Goal Priority:  Medium Start Date:    07/04/2020                         Expected End Date: 03/10/2021                       Evidence-based guidance:  Assess and address signs/symptoms of comorbidity, including dyslipidemia, diabetes, iron deficiency, gout, arthritis, dysrhythmia, hypertension, cachexia, coronary artery disease, kidney dysfunction and lung disease.  Prepare patient for laboratory and diagnostic exams based on risk and presentation.  Prepare for use of pharmacologic therapy that may include statin, angiotensin converting enzyme (ACE) inhibitor, angiotensin receptor blocker (ARB), beta-blocker, digoxin, antidysrhythmic, diuretic or omega-3 fatty acid.  Monitor side effects and anticipate need for periodic adjustments.  Prepare patient for potential invasive treatment, such as implantable cardioverter-defibrillator, cardiac resynchronization therapy or heart transplant as disease progresses.   Barriers: Health Behaviors Knowledge  Notes:  8/1 Pt reports she is managing her ongoing medical issues with no acute problems and has needed assistance for errands out in the community along with  family support. No acute issues reported as pt continue to manager her HF with daily weights reported around 120-121 lbs with no related symptoms. 4/25-Verified all other comorbidities are managed well and continue to encourage with ongoing medication regimen and attendance to all medical appointments post-operatively.      [facility]-Make and Keep All Appointments   On track    Follow Up Date 12/12/2020 Timeframe:  Short-Term Goal Priority:  Medium Start Date:  07/04/2020                           Expected End Date:  01/09/2021                   - arrange a ride through an agency 1 week before appointment - ask family or friend for a ride - call to cancel if needed - keep a calendar with prescription refill dates - keep a calendar with appointment dates   Barriers: Health Behaviors  Why is this important?   Part of staying healthy is seeing the doctor for follow-up care.  If you forget your appointments, there are some things you can do to stay on track.    Notes: 9/1-Pt confirms he has attended all medical appointments with no issues with transportation.  Will continue to offer another source of transportation if needed. 8/1- Pt reports she is keeping all her medical appointments with no delays or missed appointments. 6/7-Verified pt continue to have sufficient transportation to all  medical appointments with no delays or related issues.  Pt continue to take all prescribed medications with no needed refills. Will continue to encourage adherence with all above goals and interventions. 4/25-Pt's caregiver reports pt currently has 24/7 family/friends in the home and sufficient transportation available for all medical appointments. Will educate on additional transportation services with [employer] if needed in the future.         Raina Mina, RN Care Management Coordinator [place] Office [phone]

## 2020-11-18 ENCOUNTER — Other Ambulatory Visit: Payer: Self-pay

## 2020-11-18 ENCOUNTER — Ambulatory Visit: Payer: Medicare HMO | Admitting: Cardiovascular Disease

## 2020-11-18 VITALS — BP 138/62 | HR 54 | Ht 66.0 in | Wt 125.0 lb

## 2020-11-18 DIAGNOSIS — I350 Nonrheumatic aortic (valve) stenosis: Secondary | ICD-10-CM

## 2020-11-18 DIAGNOSIS — I4891 Unspecified atrial fibrillation: Secondary | ICD-10-CM

## 2020-11-18 DIAGNOSIS — I2721 Secondary pulmonary arterial hypertension: Secondary | ICD-10-CM | POA: Diagnosis not present

## 2020-11-18 DIAGNOSIS — E785 Hyperlipidemia, unspecified: Secondary | ICD-10-CM | POA: Diagnosis not present

## 2020-11-18 DIAGNOSIS — I2510 Atherosclerotic heart disease of native coronary artery without angina pectoris: Secondary | ICD-10-CM

## 2020-11-18 DIAGNOSIS — I10 Essential (primary) hypertension: Secondary | ICD-10-CM

## 2020-11-18 DIAGNOSIS — I5020 Unspecified systolic (congestive) heart failure: Secondary | ICD-10-CM | POA: Diagnosis not present

## 2020-11-18 DIAGNOSIS — E1159 Type 2 diabetes mellitus with other circulatory complications: Secondary | ICD-10-CM

## 2020-11-18 DIAGNOSIS — I5032 Chronic diastolic (congestive) heart failure: Secondary | ICD-10-CM

## 2020-11-18 DIAGNOSIS — I9789 Other postprocedural complications and disorders of the circulatory system, not elsewhere classified: Secondary | ICD-10-CM | POA: Diagnosis not present

## 2020-11-18 NOTE — Progress Notes (Signed)
Cardiology Office Note    Date:  11/18/2020   ID:  Marissa Jefferson, DOB [birth year], MRN [id number]  PCP:  Merrilee Seashore, MD  Cardiologist: Eshal Propps Electrophysiologist:  None   Evaluation Performed:  Follow-Up Visit  Chief Complaint:  CAD  History of Present Illness:    Marissa Jefferson is a 87 y.o. female with CAD s/p CABG 123456, chronic diastolic heart failure, postoperative atrial fibrillation without subsequent recurrence, essential hypertension, type 2 diabetes mellitus, mixed hyperlipidemia, history of left breast cancer with surgery and radiation.  When she initially presented in 2015 she had severe left ventricular systolic dysfunction with an ejection fraction of 20-25% and episodes of nonsustained ventricular tachycardia.  Following surgery left ventricular systolic function completely recovered to an EF of 55%.  She was hospitalized in April 123456 with diastolic heart failure exacerbation.  Her echocardiogram again showed EF of 55 to 60%, with moderate LVH the estimated systolic PA pressure was 63 mmHg and the inferior vena cava was plethoric.  The right ventricle was described as being severely depressed.  She had a left pleural effusion.  There was mild aortic valve stenosis.  After some adjustment of her diuretic she has been very stable for the last roughly 7 months.  Her weight is almost always exactly 120 pounds on her home scale.  Rarely will improve over 121 pounds.  She has not needed to adjust her dose of furosemide (10 mg once daily) in several months.  She has not had orthopnea, PND or lower extremity edema and she denies angina at rest or with activity.  She has not had dizziness or syncope.    Her most recent creatinine was 0.86 and her potassium was 4.8.  On statin, her LDL is 69.  Glycemic control has been suboptimal, with the most recent hemoglobin A1c 8.4%.  Past Medical History:  Diagnosis Date   Arthritis    "knees" (10/08/2013)   Breast cancer ([place])     "left; I had 62 sessions of radiation"   CAD (coronary artery disease), native coronary artery 10/06/2013   LAD 90%, CFX 90%, RCA 100% w/ collat   Chronic combined systolic and diastolic CHF, NYHA class 2 ([place]) 09/2013   CVA (cerebral vascular accident) ([place]) 1968   leaving no deficit   DM2 (diabetes mellitus, type 2) ([place])    GERD (gastroesophageal reflux disease)    Hyperlipidemia    Hypertension    Hypothyroidism    Ischemic dilated cardiomyopathy ([place]) 09/2013   EF 20-25% by echo   NSVT (nonsustained ventricular tachycardia) ([place]) 09/2013   OAB (overactive bladder)    Osteopenia    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) 05/10/2014   Past Surgical History:  Procedure Laterality Date   BREAST LUMPECTOMY Left 1998   BREAST LUMPECTOMY WITH AXILLARY LYMPH NODE DISSECTION Left 1998   CARDIAC CATHETERIZATION  10/06/2013   CATARACT EXTRACTION W/ INTRAOCULAR LENS  IMPLANT, BILATERAL Bilateral ~ 2009   COLONOSCOPY  2005   CORONARY ARTERY BYPASS GRAFT [name] 10/09/2013   Procedure: CORONARY ARTERY BYPASS GRAFTING (CABG) x 4 using left internal mammary artery and right saphenous leg vein using endoscope.;  Surgeon: Melrose Nakayama, MD;  Location: [facility];  Service: Open Heart Surgery;  Laterality: [name];   INTRAOPERATIVE TRANSESOPHAGEAL ECHOCARDIOGRAM [name] 10/09/2013   Procedure: INTRAOPERATIVE TRANSESOPHAGEAL ECHOCARDIOGRAM;  Surgeon: Melrose Nakayama, MD;  Location: [facility];  Service: Open Heart Surgery;  Laterality: [name];   LEFT AND RIGHT HEART CATHETERIZATION WITH CORONARY ANGIOGRAM [name]  10/06/2013   Procedure: LEFT AND RIGHT HEART CATHETERIZATION WITH CORONARY ANGIOGRAM;  Surgeon: Sanda Klein, MD;  Location: [facility];  Service: Cardiovascular;  Laterality: [name];   TONSILLECTOMY AND ADENOIDECTOMY  1930's     Current Meds  Medication Sig   acetaminophen (TYLENOL) 500 MG tablet Take 1,000 mg by mouth See admin instructions. Take 1,000 mg by mouth in the  morning & at bedtime and an additional 500 mg once a day as needed for pain/discomfort   amLODipine (NORVASC) 10 MG tablet TAKE 1 TABLET BY MOUTH EVERY DAY (Patient taking differently: Take 10 mg by mouth daily.)   aspirin EC 81 MG tablet Take 1 tablet (81 mg total) by mouth daily.   atorvastatin (LIPITOR) 80 MG tablet Take 80 mg by mouth daily.   B Complex-Biotin-FA (SUPER B-50 COMPLEX PO) Take 1 tablet by mouth daily before lunch.   Biotin 5000 MCG SUBL Place 5,000 mcg under the tongue daily.   carvedilol (COREG) 25 MG tablet TAKE 1 TABLET BY MOUTH TWICE A DAY WITH MEALS (Patient taking differently: Take 25 mg by mouth 2 (two) times daily with a meal.)   Cholecalciferol (VITAMIN D3) 125 MCG (5000 UT) CAPS Take 5,000 Units by mouth daily before lunch.   Cyanocobalamin (VITAMIN B-12) 2500 MCG SUBL Place 2,500 mcg under the tongue daily before lunch.   denosumab (PROLIA) 60 MG/ML SOSY injection Inject 60 mg into the skin every 6 (six) months.   docusate sodium (COLACE) 100 MG capsule Take 100 mg by mouth See admin instructions. Take 100 mg by mouth at bedtime and HOLD FOR DIARRHEA   levothyroxine (SYNTHROID, LEVOTHROID) 150 MCG tablet Take 150 mcg by mouth daily before breakfast.   lisinopril (ZESTRIL) 10 MG tablet TAKE 1 TABLET BY MOUTH EVERY DAY   Magnesium Oxide 400 (240 MG) MG TABS TAKE 1 TABLET TWICE DAILY. (Patient taking differently: Take 400 mg by mouth daily.)   metFORMIN (GLUCOPHAGE) 500 MG tablet Take 1,000 mg by mouth 2 (two) times daily.   potassium chloride (KLOR-CON) 10 MEQ tablet Take 10 mEq by mouth daily.   protein supplement shake (PREMIER PROTEIN) LIQD Take 237 mLs by mouth in the morning.   solifenacin (VESICARE) 10 MG tablet Take 10 mg by mouth at bedtime.   traMADol (ULTRAM) 50 MG tablet Take 50 mg by mouth 3 (three) times daily as needed for moderate pain.     Allergies:   Amiodarone, Crestor [rosuvastatin], and Sitagliptin   Social History   Tobacco Use   Smoking  status: Former    Packs/day: 0.25    Years: 3.00    Pack years: 0.75    Types: Cigarettes    Quit date: 03/12/1948    Years since quitting: 72.7   Smokeless tobacco: Never   Tobacco comments:    smoked in college   Substance Use Topics   Alcohol use: No    Alcohol/week: 0.0 standard drinks   Drug use: No     Family Hx: The patient's family history includes Asthma in her daughter and son; Cancer in her father; Colon cancer in her mother; Heart attack in her father.  ROS:   Please see the history of present illness.    All other systems reviewed and are negative.   Prior CV studies:   The following studies were reviewed today:  Labs/Other Tests and Data Reviewed:    Echocardiogram 06/18/2020    1. Left ventricular ejection fraction, by estimation, is 55 to 60%. The  left  ventricle has normal function. The left ventricle has no regional  wall motion abnormalities. There is moderate left ventricular hypertrophy.  Left ventricular diastolic  parameters are consistent with Grade II diastolic dysfunction  (pseudonormalization). Elevated left ventricular end-diastolic pressure.  There is the interventricular septum is flattened in diastole ('D' shaped  left ventricle), consistent with right  ventricular volume overload and the interventricular septum is flattened  in systole, consistent with right ventricular pressure overload.   2. Right ventricular systolic function is severely reduced. The right  ventricular size is mildly enlarged. There is severely elevated pulmonary  artery systolic pressure. The estimated right ventricular systolic  pressure is [id number] mmHg.   3. Left atrial size was mildly dilated.   4. Large pleural effusion in the left lateral region.   5. The mitral valve is grossly normal. Mild mitral valve regurgitation.   6. Tricuspid valve regurgitation is moderate.   7. The NCC and the LCC are fused. . The aortic valve is tricuspid. Aortic  valve regurgitation is  not visualized. Mild aortic valve stenosis.   8. The inferior vena cava is dilated in size with <50% respiratory  variability, suggesting right atrial pressure of 15 mmHg.   EKG: Is not ordered today.  Her most recent tracing from 06/17/2020 shows sinus rhythm and right bundle branch block (chronic)  Recent Labs: 06/17/2020: TSH 2.175 06/22/2020: B Natriuretic Peptide 546.5 06/27/2020: Hemoglobin 16.4; Magnesium 2.2; Platelets 204 07/19/2020: BUN 39; Creatinine, Ser 0.86; Potassium 4.8; Sodium 140    Recent Lipid Panel Lab Results  Component Value Date/Time   CHOL 135 05/10/2014 02:40 PM   TRIG 138 05/10/2014 02:40 PM   HDL 47 05/10/2014 02:40 PM   CHOLHDL 2.9 05/10/2014 02:40 PM   LDLCALC 60 05/10/2014 02:40 PM   12/02/2019 Total cholesterol 118, HDL 39, LDL 60, triglycerides 102  07/04/2020 Cholesterol 125, HDL 34, LDL 69, triglycerides 121  Wt Readings from Last 3 Encounters:  11/18/20 125 lb (56.7 kg)  07/19/20 124 lb 12.8 oz (56.6 kg)  06/27/20 129 lb 6.6 oz (58.7 kg)     Objective:    Vital Signs:  BP 138/62 (BP Location: Left Arm, Patient Position: Sitting, Cuff Size: Normal)   Pulse (!) 54   Ht '5\' 6"'$  (1.676 m)   Wt 125 lb (56.7 kg)   BMI 20.18 kg/m     General: Alert, oriented x3, no distress, appears elderly and frail Head: no evidence of trauma, PERRL, EOMI, no exophtalmos or lid lag, no myxedema, no xanthelasma; normal ears, nose and oropharynx Neck: normal jugular venous pulsations and no hepatojugular reflux; brisk carotid pulses without delay and no carotid bruits Chest: clear to auscultation, no signs of consolidation by percussion or palpation, normal fremitus, symmetrical and full respiratory excursions Cardiovascular: normal position and quality of the apical impulse, regular rhythm, normal first and widely split second heart sounds, 2/6 early peaking aortic ejection murmur, no diastolic murmurs, rubs or gallops Abdomen: no tenderness or distention, no  masses by palpation, no abnormal pulsatility or arterial bruits, normal bowel sounds, no hepatosplenomegaly Extremities: no clubbing, cyanosis or edema; 2+ radial, ulnar and brachial pulses bilaterally; 2+ right femoral, posterior tibial and dorsalis pedis pulses; 2+ left femoral, posterior tibial and dorsalis pedis pulses; no subclavian or femoral bruits Neurological: grossly nonfocal Psych: Normal mood and affect    ASSESSMENT & PLAN:    1. Coronary artery disease involving native coronary artery of native heart without angina pectoris   2. Chronic diastolic heart failure (  [place])   3. Essential hypertension   4. Dyslipidemia (high LDL; low HDL)   5. Controlled type 2 diabetes mellitus with other circulatory complication, without long-term current use of insulin ([place])   6. Postoperative atrial fibrillation (H[facility])   7. Nonrheumatic aortic valve stenosis   8. PAH (pulmonary artery hypertension) (H[facility])       CAD: 7 years since her bypass surgery.  Remains free of angina pectoris.    On aspirin, statin, beta-blocker. CHF: Appears reasonably well compensated, NYHA functional class II, euvolemic on a low-dose of loop diuretic.  Continue to monitor her weight daily and avoid sodium rich foods. HTN: Fair control.  Tolerate systolic blood pressure to 140. HLP: HDL chronically low, but LDL within our target range for treatment. DM: The most recent hemoglobin A1c I have is from April 2022 and was out of range at 8.4%, but she thinks glycemic control has improved since then. Postop atrial fibrillation: No atrial fibrillation has been detected since her immediate postop period in 2015 AS: No exertional symptoms.  Echo in April of this year showed only mild aortic stenosis. PAH: Largely explained by left heart failure.  Amyloidosis is considered.  Aggressive work-up at age 61 may not lead to major changes in her treatment.   Patient Instructions  Medication Instructions:  No changes *If you need a  refill on your cardiac medications before your next appointment, please call your pharmacy*   Lab Work: None ordered If you have labs (blood work) drawn today and your tests are completely normal, you will receive your results only by: [place] (if you have MyChart) OR A paper copy in the mail If you have any lab test that is abnormal or we need to change your treatment, we will call you to review the results.   Testing/Procedures: None ordered   Follow-Up: At [facility], you and your health needs are our priority.  As part of our continuing mission to provide you with exceptional heart care, we have created designated Provider Care Teams.  These Care Teams include your primary Cardiologist (physician) and Advanced Practice Providers (APPs -  Physician Assistants and Nurse Practitioners) who all work together to provide you with the care you need, when you need it.  We recommend signing up for the patient portal called "MyChart".  Sign up information is provided on this After Visit Summary.  MyChart is used to connect with patients for Virtual Visits (Telemedicine).  Patients are able to view lab/test results, encounter notes, upcoming appointments, etc.  Non-urgent messages can be sent to your provider as well.   To learn more about what you can do with MyChart, go to [email or web address].    Your next appointment:   12 month(s)  The format for your next appointment:   In Person  Provider:   You may see Sanda Klein, MD or one of the following Advanced Practice Providers on your designated Care Team:   Almyra Deforest, PA-C Fabian Sharp, [place] or  Roby Lofts, PA-C  Signed, Sanda Klein, MD  11/18/2020 2:34 PM    [place]

## 2020-11-18 NOTE — Patient Instructions (Signed)

## 2020-12-05 DIAGNOSIS — Z7982 Long term (current) use of aspirin: Secondary | ICD-10-CM | POA: Diagnosis not present

## 2020-12-05 DIAGNOSIS — N3281 Overactive bladder: Secondary | ICD-10-CM | POA: Diagnosis not present

## 2020-12-05 DIAGNOSIS — I130 Hypertensive heart and chronic kidney disease with heart failure and stage 1 through stage 4 chronic kidney disease, or unspecified chronic kidney disease: Secondary | ICD-10-CM | POA: Diagnosis not present

## 2020-12-05 DIAGNOSIS — M170 Bilateral primary osteoarthritis of knee: Secondary | ICD-10-CM | POA: Diagnosis not present

## 2020-12-05 DIAGNOSIS — R32 Unspecified urinary incontinence: Secondary | ICD-10-CM | POA: Diagnosis not present

## 2020-12-05 DIAGNOSIS — Z7984 Long term (current) use of oral hypoglycemic drugs: Secondary | ICD-10-CM | POA: Diagnosis not present

## 2020-12-05 DIAGNOSIS — E1122 Type 2 diabetes mellitus with diabetic chronic kidney disease: Secondary | ICD-10-CM | POA: Diagnosis not present

## 2020-12-05 DIAGNOSIS — Z8673 Personal history of transient ischemic attack (TIA), and cerebral infarction without residual deficits: Secondary | ICD-10-CM | POA: Diagnosis not present

## 2020-12-05 DIAGNOSIS — N1831 Chronic kidney disease, stage 3a: Secondary | ICD-10-CM | POA: Diagnosis not present

## 2020-12-05 DIAGNOSIS — I5022 Chronic systolic (congestive) heart failure: Secondary | ICD-10-CM | POA: Diagnosis not present

## 2020-12-05 DIAGNOSIS — I2510 Atherosclerotic heart disease of native coronary artery without angina pectoris: Secondary | ICD-10-CM | POA: Diagnosis not present

## 2020-12-05 DIAGNOSIS — Z853 Personal history of malignant neoplasm of breast: Secondary | ICD-10-CM | POA: Diagnosis not present

## 2020-12-05 DIAGNOSIS — Z9981 Dependence on supplemental oxygen: Secondary | ICD-10-CM | POA: Diagnosis not present

## 2020-12-05 DIAGNOSIS — I255 Ischemic cardiomyopathy: Secondary | ICD-10-CM | POA: Diagnosis not present

## 2020-12-05 DIAGNOSIS — E782 Mixed hyperlipidemia: Secondary | ICD-10-CM | POA: Diagnosis not present

## 2020-12-05 DIAGNOSIS — K219 Gastro-esophageal reflux disease without esophagitis: Secondary | ICD-10-CM | POA: Diagnosis not present

## 2020-12-05 DIAGNOSIS — K5901 Slow transit constipation: Secondary | ICD-10-CM | POA: Diagnosis not present

## 2020-12-05 DIAGNOSIS — M1711 Unilateral primary osteoarthritis, right knee: Secondary | ICD-10-CM | POA: Diagnosis not present

## 2020-12-05 DIAGNOSIS — M8580 Other specified disorders of bone density and structure, unspecified site: Secondary | ICD-10-CM | POA: Diagnosis not present

## 2020-12-05 DIAGNOSIS — Z951 Presence of aortocoronary bypass graft: Secondary | ICD-10-CM | POA: Diagnosis not present

## 2020-12-05 DIAGNOSIS — E039 Hypothyroidism, unspecified: Secondary | ICD-10-CM | POA: Diagnosis not present

## 2020-12-12 ENCOUNTER — Other Ambulatory Visit: Payer: Self-pay | Admitting: *Deleted

## 2020-12-12 NOTE — Patient Outreach (Signed)
[facility]) Care Management  12/12/2020  Marissa Jefferson 12-14-1924 [id number]   Telephone Assessment-Unsuccessful  RN attempted to contact pt on both her home and cell however unsuccessful and unable to leave a HIPAA approved voice message.  Will attempted another outreach over the next week.  Raina Mina, RN Care Management Coordinator [place] Office [phone]

## 2020-12-14 DIAGNOSIS — R32 Unspecified urinary incontinence: Secondary | ICD-10-CM | POA: Diagnosis not present

## 2020-12-14 DIAGNOSIS — I5022 Chronic systolic (congestive) heart failure: Secondary | ICD-10-CM | POA: Diagnosis not present

## 2020-12-14 DIAGNOSIS — K5901 Slow transit constipation: Secondary | ICD-10-CM | POA: Diagnosis not present

## 2020-12-14 DIAGNOSIS — M8580 Other specified disorders of bone density and structure, unspecified site: Secondary | ICD-10-CM | POA: Diagnosis not present

## 2020-12-14 DIAGNOSIS — I255 Ischemic cardiomyopathy: Secondary | ICD-10-CM | POA: Diagnosis not present

## 2020-12-14 DIAGNOSIS — Z951 Presence of aortocoronary bypass graft: Secondary | ICD-10-CM | POA: Diagnosis not present

## 2020-12-14 DIAGNOSIS — N1831 Chronic kidney disease, stage 3a: Secondary | ICD-10-CM | POA: Diagnosis not present

## 2020-12-14 DIAGNOSIS — M170 Bilateral primary osteoarthritis of knee: Secondary | ICD-10-CM | POA: Diagnosis not present

## 2020-12-14 DIAGNOSIS — Z853 Personal history of malignant neoplasm of breast: Secondary | ICD-10-CM | POA: Diagnosis not present

## 2020-12-14 DIAGNOSIS — I130 Hypertensive heart and chronic kidney disease with heart failure and stage 1 through stage 4 chronic kidney disease, or unspecified chronic kidney disease: Secondary | ICD-10-CM | POA: Diagnosis not present

## 2020-12-14 DIAGNOSIS — Z9981 Dependence on supplemental oxygen: Secondary | ICD-10-CM | POA: Diagnosis not present

## 2020-12-14 DIAGNOSIS — I2510 Atherosclerotic heart disease of native coronary artery without angina pectoris: Secondary | ICD-10-CM | POA: Diagnosis not present

## 2020-12-14 DIAGNOSIS — K219 Gastro-esophageal reflux disease without esophagitis: Secondary | ICD-10-CM | POA: Diagnosis not present

## 2020-12-14 DIAGNOSIS — Z8673 Personal history of transient ischemic attack (TIA), and cerebral infarction without residual deficits: Secondary | ICD-10-CM | POA: Diagnosis not present

## 2020-12-14 DIAGNOSIS — N3281 Overactive bladder: Secondary | ICD-10-CM | POA: Diagnosis not present

## 2020-12-14 DIAGNOSIS — E782 Mixed hyperlipidemia: Secondary | ICD-10-CM | POA: Diagnosis not present

## 2020-12-14 DIAGNOSIS — E039 Hypothyroidism, unspecified: Secondary | ICD-10-CM | POA: Diagnosis not present

## 2020-12-14 DIAGNOSIS — Z7982 Long term (current) use of aspirin: Secondary | ICD-10-CM | POA: Diagnosis not present

## 2020-12-14 DIAGNOSIS — E1122 Type 2 diabetes mellitus with diabetic chronic kidney disease: Secondary | ICD-10-CM | POA: Diagnosis not present

## 2020-12-14 DIAGNOSIS — Z7984 Long term (current) use of oral hypoglycemic drugs: Secondary | ICD-10-CM | POA: Diagnosis not present

## 2020-12-15 ENCOUNTER — Other Ambulatory Visit: Payer: Self-pay | Admitting: *Deleted

## 2020-12-15 NOTE — Patient Outreach (Signed)
[facility] [facility]) Care Management  12/15/2020  Marissa Jefferson 20-Sep-1924 [id number]  Telephone Assessment-Successful-HF  RN spoke with pt today and confirm her ongoing management of care. Plan of care reviewed and discussed with noted updates within the plan. Pt reports her weights remain at her baseline of 121 lbs with no swelling or acute needs at this time. Pt reports she is active with [facility] for PT and RN. Pt verified her blood pressures and diabetes are good with no reported issues or acute problems.   Pt reports an outing where is did not wear her portable O2 with pulse ox monitoring sating at 89-91%. Pt states now that she has returned home she is wearing her O2. Stress the importance of wearing her home O2 as recommended (verbalized an understanding).   Will follow up next month as requested with ongoing care management services.  Goals Addressed             This Visit's Progress    THN-Comorbidities Identified and Managed   On track    Follow up Date: 01/12/2021 Timeframe:  Long-Range Goal Priority:  Medium Start Date:    07/04/2020                         Expected End Date: 03/10/2021                       Evidence-based guidance:  Assess and address signs/symptoms of comorbidity, including dyslipidemia, diabetes, iron deficiency, gout, arthritis, dysrhythmia, hypertension, cachexia, coronary artery disease, kidney dysfunction and lung disease.  Prepare patient for laboratory and diagnostic exams based on risk and presentation.  Prepare for use of pharmacologic therapy that may include statin, angiotensin converting enzyme (ACE) inhibitor, angiotensin receptor blocker (ARB), beta-blocker, digoxin, antidysrhythmic, diuretic or omega-3 fatty acid.  Monitor side effects and anticipate need for periodic adjustments.  Prepare patient for potential invasive treatment, such as implantable cardioverter-defibrillator, cardiac resynchronization therapy or heart transplant as  disease progresses.   Barriers: Health Behaviors Knowledge  Notes:  8/1 Pt reports she is managing her ongoing medical issues with no acute problems and has needed assistance for errands out in the community along with family support. No acute issues reported as pt continue to manager her HF with daily weights reported around 120-121 lbs with no related symptoms. 4/25-Verified all other comorbidities are managed well and continue to encourage with ongoing medication regimen and attendance to all medical appointments post-operatively.      THN-Make and Keep All Appointments   On track    Follow Up Date 01/12/2021 Timeframe:  Short-Term Goal Priority:  Medium Start Date:  07/04/2020                           Expected End Date:  02/08/2021                   - arrange a ride through an agency 1 week before appointment - ask family or friend for a ride - call to cancel if needed - keep a calendar with prescription refill dates - keep a calendar with appointment dates   Barriers: Health Behaviors  Why is this important?   Part of staying healthy is seeing the doctor for follow-up care.  If you forget your appointments, there are some things you can do to stay on track.    Notes: 10/6- Pt verified upcoming appointments and indicated  she can only have appointments on Tues/Thurs in the mornings. States she has several upcoming appointments and will call to reschedule those appointment that are in conflict and rescheduled those appointments. Strongly encouraged pt to call to rescheduled those appointments to avoid fees. 9/1-Pt confirms he has attended all medical appointments with no issues with transportation.  Will continue to offer another source of transportation if needed. 8/1- Pt reports she is keeping all her medical appointments with no delays or missed appointments. 6/7-Verified pt continue to have sufficient transportation to all medical appointments with no delays or related issues.  Pt  continue to take all prescribed medications with no needed refills. Will continue to encourage adherence with all above goals and interventions. 4/25-Pt's caregiver reports pt currently has 24/7 family/friends in the home and sufficient transportation available for all medical appointments. Will educate on additional transportation services with [employer] if needed in the future.         Raina Mina, RN Care Management Coordinator [place] Office [phone]

## 2020-12-18 DIAGNOSIS — I5020 Unspecified systolic (congestive) heart failure: Secondary | ICD-10-CM | POA: Diagnosis not present

## 2020-12-21 DIAGNOSIS — N3281 Overactive bladder: Secondary | ICD-10-CM | POA: Diagnosis not present

## 2020-12-21 DIAGNOSIS — Z853 Personal history of malignant neoplasm of breast: Secondary | ICD-10-CM | POA: Diagnosis not present

## 2020-12-21 DIAGNOSIS — K5901 Slow transit constipation: Secondary | ICD-10-CM | POA: Diagnosis not present

## 2020-12-21 DIAGNOSIS — I2510 Atherosclerotic heart disease of native coronary artery without angina pectoris: Secondary | ICD-10-CM | POA: Diagnosis not present

## 2020-12-21 DIAGNOSIS — Z7982 Long term (current) use of aspirin: Secondary | ICD-10-CM | POA: Diagnosis not present

## 2020-12-21 DIAGNOSIS — R32 Unspecified urinary incontinence: Secondary | ICD-10-CM | POA: Diagnosis not present

## 2020-12-21 DIAGNOSIS — M8580 Other specified disorders of bone density and structure, unspecified site: Secondary | ICD-10-CM | POA: Diagnosis not present

## 2020-12-21 DIAGNOSIS — Z951 Presence of aortocoronary bypass graft: Secondary | ICD-10-CM | POA: Diagnosis not present

## 2020-12-21 DIAGNOSIS — I255 Ischemic cardiomyopathy: Secondary | ICD-10-CM | POA: Diagnosis not present

## 2020-12-21 DIAGNOSIS — E782 Mixed hyperlipidemia: Secondary | ICD-10-CM | POA: Diagnosis not present

## 2020-12-21 DIAGNOSIS — K219 Gastro-esophageal reflux disease without esophagitis: Secondary | ICD-10-CM | POA: Diagnosis not present

## 2020-12-21 DIAGNOSIS — Z8673 Personal history of transient ischemic attack (TIA), and cerebral infarction without residual deficits: Secondary | ICD-10-CM | POA: Diagnosis not present

## 2020-12-21 DIAGNOSIS — Z7984 Long term (current) use of oral hypoglycemic drugs: Secondary | ICD-10-CM | POA: Diagnosis not present

## 2020-12-21 DIAGNOSIS — Z9981 Dependence on supplemental oxygen: Secondary | ICD-10-CM | POA: Diagnosis not present

## 2020-12-21 DIAGNOSIS — I130 Hypertensive heart and chronic kidney disease with heart failure and stage 1 through stage 4 chronic kidney disease, or unspecified chronic kidney disease: Secondary | ICD-10-CM | POA: Diagnosis not present

## 2020-12-21 DIAGNOSIS — M170 Bilateral primary osteoarthritis of knee: Secondary | ICD-10-CM | POA: Diagnosis not present

## 2020-12-21 DIAGNOSIS — E039 Hypothyroidism, unspecified: Secondary | ICD-10-CM | POA: Diagnosis not present

## 2020-12-21 DIAGNOSIS — I5022 Chronic systolic (congestive) heart failure: Secondary | ICD-10-CM | POA: Diagnosis not present

## 2020-12-21 DIAGNOSIS — E1122 Type 2 diabetes mellitus with diabetic chronic kidney disease: Secondary | ICD-10-CM | POA: Diagnosis not present

## 2020-12-21 DIAGNOSIS — N1831 Chronic kidney disease, stage 3a: Secondary | ICD-10-CM | POA: Diagnosis not present

## 2020-12-23 DIAGNOSIS — I2510 Atherosclerotic heart disease of native coronary artery without angina pectoris: Secondary | ICD-10-CM | POA: Diagnosis not present

## 2020-12-23 DIAGNOSIS — M170 Bilateral primary osteoarthritis of knee: Secondary | ICD-10-CM | POA: Diagnosis not present

## 2020-12-23 DIAGNOSIS — I5022 Chronic systolic (congestive) heart failure: Secondary | ICD-10-CM | POA: Diagnosis not present

## 2020-12-23 DIAGNOSIS — I255 Ischemic cardiomyopathy: Secondary | ICD-10-CM | POA: Diagnosis not present

## 2021-01-04 DIAGNOSIS — Z951 Presence of aortocoronary bypass graft: Secondary | ICD-10-CM | POA: Diagnosis not present

## 2021-01-04 DIAGNOSIS — E782 Mixed hyperlipidemia: Secondary | ICD-10-CM | POA: Diagnosis not present

## 2021-01-04 DIAGNOSIS — I2510 Atherosclerotic heart disease of native coronary artery without angina pectoris: Secondary | ICD-10-CM | POA: Diagnosis not present

## 2021-01-04 DIAGNOSIS — Z7982 Long term (current) use of aspirin: Secondary | ICD-10-CM | POA: Diagnosis not present

## 2021-01-04 DIAGNOSIS — K5901 Slow transit constipation: Secondary | ICD-10-CM | POA: Diagnosis not present

## 2021-01-04 DIAGNOSIS — Z9981 Dependence on supplemental oxygen: Secondary | ICD-10-CM | POA: Diagnosis not present

## 2021-01-04 DIAGNOSIS — I255 Ischemic cardiomyopathy: Secondary | ICD-10-CM | POA: Diagnosis not present

## 2021-01-04 DIAGNOSIS — I130 Hypertensive heart and chronic kidney disease with heart failure and stage 1 through stage 4 chronic kidney disease, or unspecified chronic kidney disease: Secondary | ICD-10-CM | POA: Diagnosis not present

## 2021-01-04 DIAGNOSIS — E039 Hypothyroidism, unspecified: Secondary | ICD-10-CM | POA: Diagnosis not present

## 2021-01-04 DIAGNOSIS — Z7984 Long term (current) use of oral hypoglycemic drugs: Secondary | ICD-10-CM | POA: Diagnosis not present

## 2021-01-04 DIAGNOSIS — M8580 Other specified disorders of bone density and structure, unspecified site: Secondary | ICD-10-CM | POA: Diagnosis not present

## 2021-01-04 DIAGNOSIS — N1831 Chronic kidney disease, stage 3a: Secondary | ICD-10-CM | POA: Diagnosis not present

## 2021-01-04 DIAGNOSIS — K219 Gastro-esophageal reflux disease without esophagitis: Secondary | ICD-10-CM | POA: Diagnosis not present

## 2021-01-04 DIAGNOSIS — N3281 Overactive bladder: Secondary | ICD-10-CM | POA: Diagnosis not present

## 2021-01-04 DIAGNOSIS — I5022 Chronic systolic (congestive) heart failure: Secondary | ICD-10-CM | POA: Diagnosis not present

## 2021-01-04 DIAGNOSIS — Z853 Personal history of malignant neoplasm of breast: Secondary | ICD-10-CM | POA: Diagnosis not present

## 2021-01-04 DIAGNOSIS — R32 Unspecified urinary incontinence: Secondary | ICD-10-CM | POA: Diagnosis not present

## 2021-01-04 DIAGNOSIS — E1122 Type 2 diabetes mellitus with diabetic chronic kidney disease: Secondary | ICD-10-CM | POA: Diagnosis not present

## 2021-01-04 DIAGNOSIS — Z8673 Personal history of transient ischemic attack (TIA), and cerebral infarction without residual deficits: Secondary | ICD-10-CM | POA: Diagnosis not present

## 2021-01-04 DIAGNOSIS — M170 Bilateral primary osteoarthritis of knee: Secondary | ICD-10-CM | POA: Diagnosis not present

## 2021-01-05 ENCOUNTER — Emergency Department (HOSPITAL_COMMUNITY): Payer: Medicare HMO

## 2021-01-05 ENCOUNTER — Emergency Department (HOSPITAL_COMMUNITY)
Admission: EM | Admit: 2021-01-05 | Discharge: 2021-01-05 | Disposition: A | Discharge status: Home / Self Care | Payer: Medicare HMO | Attending: Student | Admitting: Student

## 2021-01-05 ENCOUNTER — Encounter (HOSPITAL_COMMUNITY): Payer: Self-pay

## 2021-01-05 DIAGNOSIS — M25561 Pain in right knee: Secondary | ICD-10-CM | POA: Diagnosis not present

## 2021-01-05 DIAGNOSIS — S3992XA Unspecified injury of lower back, initial encounter: Secondary | ICD-10-CM | POA: Diagnosis present

## 2021-01-05 DIAGNOSIS — M8588 Other specified disorders of bone density and structure, other site: Secondary | ICD-10-CM | POA: Insufficient documentation

## 2021-01-05 DIAGNOSIS — Z5321 Procedure and treatment not carried out due to patient leaving prior to being seen by health care provider: Secondary | ICD-10-CM | POA: Diagnosis not present

## 2021-01-05 DIAGNOSIS — R739 Hyperglycemia, unspecified: Secondary | ICD-10-CM | POA: Diagnosis not present

## 2021-01-05 DIAGNOSIS — M25562 Pain in left knee: Secondary | ICD-10-CM | POA: Insufficient documentation

## 2021-01-05 DIAGNOSIS — M1712 Unilateral primary osteoarthritis, left knee: Secondary | ICD-10-CM | POA: Diagnosis not present

## 2021-01-05 DIAGNOSIS — R0902 Hypoxemia: Secondary | ICD-10-CM | POA: Diagnosis not present

## 2021-01-05 DIAGNOSIS — M25462 Effusion, left knee: Secondary | ICD-10-CM | POA: Diagnosis not present

## 2021-01-05 DIAGNOSIS — M545 Low back pain, unspecified: Secondary | ICD-10-CM | POA: Insufficient documentation

## 2021-01-05 DIAGNOSIS — M5137 Other intervertebral disc degeneration, lumbosacral region: Secondary | ICD-10-CM | POA: Insufficient documentation

## 2021-01-05 DIAGNOSIS — S3993XA Unspecified injury of pelvis, initial encounter: Secondary | ICD-10-CM | POA: Diagnosis not present

## 2021-01-05 DIAGNOSIS — Z743 Need for continuous supervision: Secondary | ICD-10-CM | POA: Diagnosis not present

## 2021-01-05 DIAGNOSIS — W010XXA Fall on same level from slipping, tripping and stumbling without subsequent striking against object, initial encounter: Secondary | ICD-10-CM | POA: Insufficient documentation

## 2021-01-05 DIAGNOSIS — I700 Atherosclerosis of aorta: Secondary | ICD-10-CM | POA: Diagnosis not present

## 2021-01-05 DIAGNOSIS — Y92009 Unspecified place in unspecified non-institutional (private) residence as the place of occurrence of the external cause: Secondary | ICD-10-CM | POA: Insufficient documentation

## 2021-01-05 DIAGNOSIS — Z043 Encounter for examination and observation following other accident: Secondary | ICD-10-CM | POA: Diagnosis not present

## 2021-01-05 LAB — CK: Total CK: 226 U/L (ref 38–234)

## 2021-01-05 LAB — BASIC METABOLIC PANEL
Anion gap: 12 (ref 5–15)
BUN: 22 mg/dL (ref 8–23)
CO2: 24 mmol/L (ref 22–32)
Calcium: 10.4 mg/dL — ABNORMAL HIGH (ref 8.9–10.3)
Chloride: 98 mmol/L (ref 98–111)
Creatinine, Ser: 0.58 mg/dL (ref 0.44–1.00)
GFR, Estimated: >60 mL/min (ref >60)
Glucose, Bld: 283 mg/dL — ABNORMAL HIGH (ref 70–99)
Potassium: 4.1 mmol/L (ref 3.5–5.1)
Sodium: 134 mmol/L — ABNORMAL LOW (ref 135–145)

## 2021-01-05 LAB — CBC WITH DIFFERENTIAL/PLATELET
Abs Immature Granulocytes: 0.05 10*3/uL (ref 0.00–0.07)
Basophils Absolute: 0.1 10*3/uL (ref 0.0–0.1)
Basophils Relative: 1 %
Eosinophils Absolute: 0.3 10*3/uL (ref 0.0–0.5)
Eosinophils Relative: 2 %
HCT: 48.6 % — ABNORMAL HIGH (ref 36.0–46.0)
Hemoglobin: 16.5 g/dL — ABNORMAL HIGH (ref 12.0–15.0)
Immature Granulocytes: 0 %
Lymphocytes Relative: 9 %
Lymphs Abs: 1.4 10*3/uL (ref 0.7–4.0)
MCH: 33.7 pg (ref 26.0–34.0)
MCHC: 34 g/dL (ref 30.0–36.0)
MCV: 99.2 fL (ref 80.0–100.0)
Monocytes Absolute: 1 10*3/uL (ref 0.1–1.0)
Monocytes Relative: 7 %
Neutro Abs: 12.6 10*3/uL — ABNORMAL HIGH (ref 1.7–7.7)
Neutrophils Relative %: 81 %
Platelets: 260 10*3/uL (ref 150–400)
RBC: 4.9 MIL/uL (ref 3.87–5.11)
RDW: 11.9 % (ref 11.5–15.5)
WBC: 15.4 10*3/uL — ABNORMAL HIGH (ref 4.0–10.5)
nRBC: 0 % (ref 0.0–0.2)

## 2021-01-05 NOTE — ED Triage Notes (Signed)
Pt had fall at home and was down for approx 24 hours. Pt fell after washing dishes onto knees. No LOC, did not lose conciousness. Pt was going about normal routine at baseline without issue after assist up.

## 2021-01-05 NOTE — ED Provider Notes (Signed)
Emergency Medicine Provider Triage Evaluation Note  Marissa Jefferson , a 87 y.o. female  was evaluated in triage.  Pt complains of mechanical fall.  Patient states she was doing the dishes when her walker slipped out from behind her and she fell directly on the ground.  Patient states she landed on her bottom.  No head injury or loss of consciousness.  Patient endorses low back pain and bilateral knee pain.  Patient states she was unable to get up following the fall and was on the ground for roughly 24 hours.  Patient states she was still moving while she was on the ground by scooting; however was unable to bend her knees to fully stand up.  Review of Systems  Positive: Low back pain Negative: CP  Physical Exam  BP (!) 145/90 (BP Location: Left Arm)   Pulse 95   Temp 97.8 F (36.6 C) (Oral)   Resp 16   Ht 5\' 6"  (1.676 m)   Wt 56.7 kg   SpO2 99%   BMI 20.18 kg/m  Gen:   Awake, no distress   Resp:  Normal effort  MSK:   Moves extremities without difficulty  Other:    Medical Decision Making  Medically screening exam initiated at 11:39 AM.  Appropriate orders placed.  Marissa Jefferson was informed that the remainder of the evaluation will be completed by another provider, this initial triage assessment does not replace that evaluation, and the importance of remaining in the ED until their evaluation is complete.  Labs CT lumbar spine Knee x-rays   [name] 01/05/21 1142    [name], MD 01/05/21 1154

## 2021-01-12 ENCOUNTER — Other Ambulatory Visit: Payer: Self-pay | Admitting: *Deleted

## 2021-01-12 NOTE — Patient Outreach (Signed)
[facility]) Care Management  01/12/2021  Shanyce Daris [birth year] [id number]   Telephone Assessment-Successful-HF  Spoke with pt today and confirm she continues to monitor her HF. Reports her weights at 120 lbs baseline with no reported symptoms of HF. Pt reports a recent fall and visit to the ED however no injuries as pt discharged home with [facility] involvement. Plan of care reviewed and discussed with noted updates and changes. Will strongly encourage pt to use her assistive devices bother inside and outside the home and utilize her life alert when needed with related to falls or injuries. Reviewed next primary visit on 11/10 via hospital follow up.   Will scheduled the next follow up over the next few weeks.   Goals Addressed             This Visit's Progress    THN-Comorbidities Identified and Managed   On track    Follow up Date: 02/09/2021 Timeframe:  Long-Range Goal Priority:  Medium Start Date:    07/04/2020                         Expected End Date: 03/10/2021                       Evidence-based guidance:  Assess and address signs/symptoms of comorbidity, including dyslipidemia, diabetes, iron deficiency, gout, arthritis, dysrhythmia, hypertension, cachexia, coronary artery disease, kidney dysfunction and lung disease.  Prepare patient for laboratory and diagnostic exams based on risk and presentation.  Prepare for use of pharmacologic therapy that may include statin, angiotensin converting enzyme (ACE) inhibitor, angiotensin receptor blocker (ARB), beta-blocker, digoxin, antidysrhythmic, diuretic or omega-3 fatty acid.  Monitor side effects and anticipate need for periodic adjustments.  Prepare patient for potential invasive treatment, such as implantable cardioverter-defibrillator, cardiac resynchronization therapy or heart transplant as disease progresses.   Barriers: Health Behaviors Knowledge  Notes:  8/1 Pt reports she is managing her ongoing medical  issues with no acute problems and has needed assistance for errands out in the community along with family support. No acute issues reported as pt continue to manager her HF with daily weights reported around 120-121 lbs with no related symptoms. 4/25-Verified all other comorbidities are managed well and continue to encourage with ongoing medication regimen and attendance to all medical appointments post-operatively.      THN-Make and Keep All Appointments   On track    Follow Up Date 02/09/2021 Timeframe:  Short-Term Goal Priority:  Medium Start Date:  07/04/2020                           Expected End Date:  02/09/2021                   - arrange a ride through an agency 1 week before appointment - ask family or friend for a ride - call to cancel if needed - keep a calendar with prescription refill dates - keep a calendar with appointment dates   Barriers: Health Behaviors  Why is this important?   Part of staying healthy is seeing the doctor for follow-up care.  If you forget your appointments, there are some things you can do to stay on track.    Notes: 11/3-Pt verified she continue to have sufficient transportation assistance through her daughter Webb Silversmith). RN informed pt if she needs future transportation resources to contact [facility] for care-guides assistance for other  resources such as SCATs. Aware to reach out the [facility] week prior to the need for transportation if possible. Contact number provided directly to pt today for this assistance. 10/6- Pt verified upcoming appointments and indicated she can only have appointments on Tues/Thurs in the mornings. States she has several upcoming appointments and will call to reschedule those appointment that are in conflict and rescheduled those appointments. Strongly encouraged pt to call to rescheduled those appointments to avoid fees. 9/1-Pt confirms he has attended all medical appointments with no issues with transportation.  Will continue to offer  another source of transportation if needed. 8/1- Pt reports she is keeping all her medical appointments with no delays or missed appointments. 6/7-Verified pt continue to have sufficient transportation to all medical appointments with no delays or related issues.  Pt continue to take all prescribed medications with no needed refills. Will continue to encourage adherence with all above goals and interventions. 4/25-Pt's caregiver reports pt currently has 24/7 family/friends in the home and sufficient transportation available for all medical appointments. Will educate on additional transportation services with SCATs if needed in the future.     THN-Prevent Falls and Injury       Timeframe:  Short-Term Goal Priority:  High Start Date:   01/12/2021                          Expected End Date:    03/10/2021                   Follow Up Date 02/09/2021    - always use handrails on the stairs - always wear low-heeled or flat shoes or slippers with nonskid soles - call the doctor if I am feeling too drowsy - keep a flashlight by the bed - keep my cell phone with me always - learn how to get back up if I fall - make an emergency alert plan in case I fall - pick up clutter from the floors - use a nonslip pad with throw rugs, or remove them completely - use a cane or walker Barriers: Health Behaviors     Why is this important?   Most falls happen when it is hard for you to walk safely. Your balance may be off because of an illness. You may have pain in your knees, hip or other joints.  You may be overly tired or taking medicines that make you sleepy. You may not be able to see or hear clearly.  Falls can lead to broken bones, bruises or other injuries.  There are things you can do to help prevent falling.     Notes:  12/3-Due to pt's recent falls and visit to the ED resulting in no injuries strongly encourage pt to use her assistive devices. Verified pt not hs hired assistance to come in the home  several hours throughout the week to assist pt with her ADLs. Pt indicates she has a lift alert but did not use at the time of this incident. Strongly encourage to use the devices as a preventive means for immediate assistance at this time (verbalized an understanding).         Raina Mina, RN Care Management Coordinator [place] Office [phone]

## 2021-01-18 DIAGNOSIS — I5020 Unspecified systolic (congestive) heart failure: Secondary | ICD-10-CM | POA: Diagnosis not present

## 2021-01-19 DIAGNOSIS — I129 Hypertensive chronic kidney disease with stage 1 through stage 4 chronic kidney disease, or unspecified chronic kidney disease: Secondary | ICD-10-CM | POA: Diagnosis not present

## 2021-01-19 DIAGNOSIS — Z23 Encounter for immunization: Secondary | ICD-10-CM | POA: Diagnosis not present

## 2021-01-19 DIAGNOSIS — I5022 Chronic systolic (congestive) heart failure: Secondary | ICD-10-CM | POA: Diagnosis not present

## 2021-01-19 DIAGNOSIS — E1122 Type 2 diabetes mellitus with diabetic chronic kidney disease: Secondary | ICD-10-CM | POA: Diagnosis not present

## 2021-01-19 DIAGNOSIS — N1831 Chronic kidney disease, stage 3a: Secondary | ICD-10-CM | POA: Diagnosis not present

## 2021-01-19 DIAGNOSIS — M810 Age-related osteoporosis without current pathological fracture: Secondary | ICD-10-CM | POA: Diagnosis not present

## 2021-02-08 DIAGNOSIS — I2510 Atherosclerotic heart disease of native coronary artery without angina pectoris: Secondary | ICD-10-CM | POA: Diagnosis not present

## 2021-02-08 DIAGNOSIS — I130 Hypertensive heart and chronic kidney disease with heart failure and stage 1 through stage 4 chronic kidney disease, or unspecified chronic kidney disease: Secondary | ICD-10-CM | POA: Diagnosis not present

## 2021-02-08 DIAGNOSIS — N3281 Overactive bladder: Secondary | ICD-10-CM | POA: Diagnosis not present

## 2021-02-08 DIAGNOSIS — N1831 Chronic kidney disease, stage 3a: Secondary | ICD-10-CM | POA: Diagnosis not present

## 2021-02-08 DIAGNOSIS — E039 Hypothyroidism, unspecified: Secondary | ICD-10-CM | POA: Diagnosis not present

## 2021-02-08 DIAGNOSIS — E1122 Type 2 diabetes mellitus with diabetic chronic kidney disease: Secondary | ICD-10-CM | POA: Diagnosis not present

## 2021-02-08 DIAGNOSIS — K219 Gastro-esophageal reflux disease without esophagitis: Secondary | ICD-10-CM | POA: Diagnosis not present

## 2021-02-08 DIAGNOSIS — M170 Bilateral primary osteoarthritis of knee: Secondary | ICD-10-CM | POA: Diagnosis not present

## 2021-02-08 DIAGNOSIS — I255 Ischemic cardiomyopathy: Secondary | ICD-10-CM | POA: Diagnosis not present

## 2021-02-08 DIAGNOSIS — I5022 Chronic systolic (congestive) heart failure: Secondary | ICD-10-CM | POA: Diagnosis not present

## 2021-02-09 ENCOUNTER — Other Ambulatory Visit: Payer: Self-pay | Admitting: *Deleted

## 2021-02-09 DIAGNOSIS — M170 Bilateral primary osteoarthritis of knee: Secondary | ICD-10-CM | POA: Diagnosis not present

## 2021-02-09 DIAGNOSIS — I130 Hypertensive heart and chronic kidney disease with heart failure and stage 1 through stage 4 chronic kidney disease, or unspecified chronic kidney disease: Secondary | ICD-10-CM | POA: Diagnosis not present

## 2021-02-09 DIAGNOSIS — I255 Ischemic cardiomyopathy: Secondary | ICD-10-CM | POA: Diagnosis not present

## 2021-02-09 DIAGNOSIS — I2510 Atherosclerotic heart disease of native coronary artery without angina pectoris: Secondary | ICD-10-CM | POA: Diagnosis not present

## 2021-02-09 NOTE — Patient Outreach (Signed)
[facility]) Care Management  02/09/2021  Marissa Jefferson [birth year] [id number]    Telephone Assessment-Successful-CHF  RN spoke with pt today and received an update on the discussed and reviewed plan of care. All issues and progress noted within the plan of care. Pt doing well with HHPT in the home and now has hired help to assist one week a month with her medical appointments for transportation needs. No other issues or concerns at this time.  Will follow up next month with ongoing care management services.  Goals Addressed             This Visit's Progress    THN-Comorbidities Identified and Managed   On track    Follow up Date: 03/15/2021 Timeframe:  Long-Range Goal Priority:  Medium Start Date:    07/04/2020                         Expected End Date: 06/09/2021                       Evidence-based guidance:  Assess and address signs/symptoms of comorbidity, including dyslipidemia, diabetes, iron deficiency, gout, arthritis, dysrhythmia, hypertension, cachexia, coronary artery disease, kidney dysfunction and lung disease.  Prepare patient for laboratory and diagnostic exams based on risk and presentation.  Prepare for use of pharmacologic therapy that may include statin, angiotensin converting enzyme (ACE) inhibitor, angiotensin receptor blocker (ARB), beta-blocker, digoxin, antidysrhythmic, diuretic or omega-3 fatty acid.  Monitor side effects and anticipate need for periodic adjustments.  Prepare patient for potential invasive treatment, such as implantable cardioverter-defibrillator, cardiac resynchronization therapy or heart transplant as disease progresses.   Barriers: Health Behaviors Knowledge  Notes: 12/1-Pt continues to manager her ongoing care and currently utilizing [place] services. Continue to have sufficient hired help for transportation services due to 10 steps into her apartment with no reported falls or issues however pt is concerned about her Lasix  medication not eliminating enough fluid. Denies any symptoms with no swelling or related symptoms. RN offered to contact PCP and report her concern. Office will follow up with pt accordingly. 8/1 Pt reports she is managing her ongoing medical issues with no acute problems and has needed assistance for errands out in the community along with family support. No acute issues reported as pt continue to manager her HF with daily weights reported around 120-121 lbs with no related symptoms. 4/25-Verified all other comorbidities are managed well and continue to encourage with ongoing medication regimen and attendance to all medical appointments post-operatively.      THN-Make and Keep All Appointments   On track    Follow Up Date 03/15/2021 Timeframe:  Short-Term Goal Priority:  Medium Start Date:  07/04/2020                           Expected End Date:  04/11/2021                 - arrange a ride through an agency 1 week before appointment - ask family or friend for a ride - call to cancel if needed - keep a calendar with prescription refill dates - keep a calendar with appointment dates   Barriers: Health Behaviors  Why is this important?   Part of staying healthy is seeing the doctor for follow-up care.  If you forget your appointments, there are some things you can do to stay on track.  Notes: 12/1-Pt continue adherence with attending all appointments.Reports she has hired help one week a month for all her appointments. Unable to utilize SCAT due to 10 step to get in and out of her apartment. 11/3-Pt verified she continue to have sufficient transportation assistance through her daughter Webb Silversmith). RN informed pt if she needs future transportation resources to contact [facility] for care-guides assistance for other resources such as SCATs. Aware to reach out the [facility] week prior to the need for transportation if possible. Contact number provided directly to pt today for this assistance. 10/6- Pt verified  upcoming appointments and indicated she can only have appointments on Tues/Thurs in the mornings. States she has several upcoming appointments and will call to reschedule those appointment that are in conflict and rescheduled those appointments. Strongly encouraged pt to call to rescheduled those appointments to avoid fees. 9/1-Pt confirms he has attended all medical appointments with no issues with transportation.  Will continue to offer another source of transportation if needed. 8/1- Pt reports she is keeping all her medical appointments with no delays or missed appointments. 6/7-Verified pt continue to have sufficient transportation to all medical appointments with no delays or related issues.  Pt continue to take all prescribed medications with no needed refills. Will continue to encourage adherence with all above goals and interventions. 4/25-Pt's caregiver reports pt currently has 24/7 family/friends in the home and sufficient transportation available for all medical appointments. Will educate on additional transportation services with SCATs if needed in the future.     THN-Prevent Falls and Injury   On track    Timeframe:  Short-Term Goal Priority:  High Start Date:   01/12/2021                          Expected End Date:    04/11/2021                Follow Up Date 03/15/2021   - always use handrails on the stairs - always wear low-heeled or flat shoes or slippers with nonskid soles - call the doctor if I am feeling too drowsy - keep a flashlight by the bed - keep my cell phone with me always - learn how to get back up if I fall - make an emergency alert plan in case I fall - pick up clutter from the floors - use a nonslip pad with throw rugs, or remove them completely - use a cane or walker Barriers: Health Behaviors     Why is this important?   Most falls happen when it is hard for you to walk safely. Your balance may be off because of an illness. You may have pain in your knees, hip  or other joints.  You may be overly tired or taking medicines that make you sleepy. You may not be able to see or hear clearly.  Falls can lead to broken bones, bruises or other injuries.  There are things you can do to help prevent falling.     Notes:  12/1- Pt denies any additional issues with falls and reports she is now receiving PT services with [facility]. Pt uses her assistive device at all times both inside and outside the home. Life alert active. 12/3-Due to pt's recent falls and visit to the ED resulting in no injuries strongly encourage pt to use her assistive devices. Verified pt might hired assistance to come in the home several hours throughout the week to assist pt with  her ADLs. Pt indicates she has a life alert but did not use at the time of this incident. Strongly encourage to use the devices as a preventive means for immediate assistance at this time (verbalized an understanding).         Raina Mina, RN Care Management Coordinator [place] Office [phone]

## 2021-02-12 ENCOUNTER — Other Ambulatory Visit: Payer: Self-pay | Admitting: Cardiovascular Disease

## 2021-02-15 DIAGNOSIS — I5022 Chronic systolic (congestive) heart failure: Secondary | ICD-10-CM | POA: Diagnosis not present

## 2021-02-15 DIAGNOSIS — I2510 Atherosclerotic heart disease of native coronary artery without angina pectoris: Secondary | ICD-10-CM | POA: Diagnosis not present

## 2021-02-15 DIAGNOSIS — M170 Bilateral primary osteoarthritis of knee: Secondary | ICD-10-CM | POA: Diagnosis not present

## 2021-02-15 DIAGNOSIS — K219 Gastro-esophageal reflux disease without esophagitis: Secondary | ICD-10-CM | POA: Diagnosis not present

## 2021-02-15 DIAGNOSIS — I255 Ischemic cardiomyopathy: Secondary | ICD-10-CM | POA: Diagnosis not present

## 2021-02-15 DIAGNOSIS — N1831 Chronic kidney disease, stage 3a: Secondary | ICD-10-CM | POA: Diagnosis not present

## 2021-02-15 DIAGNOSIS — E039 Hypothyroidism, unspecified: Secondary | ICD-10-CM | POA: Diagnosis not present

## 2021-02-15 DIAGNOSIS — N3281 Overactive bladder: Secondary | ICD-10-CM | POA: Diagnosis not present

## 2021-02-15 DIAGNOSIS — E1122 Type 2 diabetes mellitus with diabetic chronic kidney disease: Secondary | ICD-10-CM | POA: Diagnosis not present

## 2021-02-15 DIAGNOSIS — I130 Hypertensive heart and chronic kidney disease with heart failure and stage 1 through stage 4 chronic kidney disease, or unspecified chronic kidney disease: Secondary | ICD-10-CM | POA: Diagnosis not present

## 2021-02-17 DIAGNOSIS — I5020 Unspecified systolic (congestive) heart failure: Secondary | ICD-10-CM | POA: Diagnosis not present

## 2021-02-22 DIAGNOSIS — E039 Hypothyroidism, unspecified: Secondary | ICD-10-CM | POA: Diagnosis not present

## 2021-02-22 DIAGNOSIS — I255 Ischemic cardiomyopathy: Secondary | ICD-10-CM | POA: Diagnosis not present

## 2021-02-22 DIAGNOSIS — I5022 Chronic systolic (congestive) heart failure: Secondary | ICD-10-CM | POA: Diagnosis not present

## 2021-02-22 DIAGNOSIS — M170 Bilateral primary osteoarthritis of knee: Secondary | ICD-10-CM | POA: Diagnosis not present

## 2021-02-22 DIAGNOSIS — I2510 Atherosclerotic heart disease of native coronary artery without angina pectoris: Secondary | ICD-10-CM | POA: Diagnosis not present

## 2021-02-22 DIAGNOSIS — I130 Hypertensive heart and chronic kidney disease with heart failure and stage 1 through stage 4 chronic kidney disease, or unspecified chronic kidney disease: Secondary | ICD-10-CM | POA: Diagnosis not present

## 2021-02-22 DIAGNOSIS — K219 Gastro-esophageal reflux disease without esophagitis: Secondary | ICD-10-CM | POA: Diagnosis not present

## 2021-02-22 DIAGNOSIS — E1122 Type 2 diabetes mellitus with diabetic chronic kidney disease: Secondary | ICD-10-CM | POA: Diagnosis not present

## 2021-02-22 DIAGNOSIS — N3281 Overactive bladder: Secondary | ICD-10-CM | POA: Diagnosis not present

## 2021-02-22 DIAGNOSIS — N1831 Chronic kidney disease, stage 3a: Secondary | ICD-10-CM | POA: Diagnosis not present

## 2021-03-01 DIAGNOSIS — N3281 Overactive bladder: Secondary | ICD-10-CM | POA: Diagnosis not present

## 2021-03-01 DIAGNOSIS — N1831 Chronic kidney disease, stage 3a: Secondary | ICD-10-CM | POA: Diagnosis not present

## 2021-03-01 DIAGNOSIS — E039 Hypothyroidism, unspecified: Secondary | ICD-10-CM | POA: Diagnosis not present

## 2021-03-01 DIAGNOSIS — I255 Ischemic cardiomyopathy: Secondary | ICD-10-CM | POA: Diagnosis not present

## 2021-03-01 DIAGNOSIS — I5022 Chronic systolic (congestive) heart failure: Secondary | ICD-10-CM | POA: Diagnosis not present

## 2021-03-01 DIAGNOSIS — M170 Bilateral primary osteoarthritis of knee: Secondary | ICD-10-CM | POA: Diagnosis not present

## 2021-03-01 DIAGNOSIS — I130 Hypertensive heart and chronic kidney disease with heart failure and stage 1 through stage 4 chronic kidney disease, or unspecified chronic kidney disease: Secondary | ICD-10-CM | POA: Diagnosis not present

## 2021-03-01 DIAGNOSIS — K219 Gastro-esophageal reflux disease without esophagitis: Secondary | ICD-10-CM | POA: Diagnosis not present

## 2021-03-01 DIAGNOSIS — E1122 Type 2 diabetes mellitus with diabetic chronic kidney disease: Secondary | ICD-10-CM | POA: Diagnosis not present

## 2021-03-01 DIAGNOSIS — I2510 Atherosclerotic heart disease of native coronary artery without angina pectoris: Secondary | ICD-10-CM | POA: Diagnosis not present

## 2021-03-08 DIAGNOSIS — N3281 Overactive bladder: Secondary | ICD-10-CM | POA: Diagnosis not present

## 2021-03-08 DIAGNOSIS — N1831 Chronic kidney disease, stage 3a: Secondary | ICD-10-CM | POA: Diagnosis not present

## 2021-03-08 DIAGNOSIS — I130 Hypertensive heart and chronic kidney disease with heart failure and stage 1 through stage 4 chronic kidney disease, or unspecified chronic kidney disease: Secondary | ICD-10-CM | POA: Diagnosis not present

## 2021-03-08 DIAGNOSIS — E1122 Type 2 diabetes mellitus with diabetic chronic kidney disease: Secondary | ICD-10-CM | POA: Diagnosis not present

## 2021-03-08 DIAGNOSIS — M170 Bilateral primary osteoarthritis of knee: Secondary | ICD-10-CM | POA: Diagnosis not present

## 2021-03-08 DIAGNOSIS — I2510 Atherosclerotic heart disease of native coronary artery without angina pectoris: Secondary | ICD-10-CM | POA: Diagnosis not present

## 2021-03-08 DIAGNOSIS — I5022 Chronic systolic (congestive) heart failure: Secondary | ICD-10-CM | POA: Diagnosis not present

## 2021-03-08 DIAGNOSIS — K219 Gastro-esophageal reflux disease without esophagitis: Secondary | ICD-10-CM | POA: Diagnosis not present

## 2021-03-08 DIAGNOSIS — E039 Hypothyroidism, unspecified: Secondary | ICD-10-CM | POA: Diagnosis not present

## 2021-03-08 DIAGNOSIS — I255 Ischemic cardiomyopathy: Secondary | ICD-10-CM | POA: Diagnosis not present

## 2021-03-15 DIAGNOSIS — I5022 Chronic systolic (congestive) heart failure: Secondary | ICD-10-CM | POA: Diagnosis not present

## 2021-03-15 DIAGNOSIS — K219 Gastro-esophageal reflux disease without esophagitis: Secondary | ICD-10-CM | POA: Diagnosis not present

## 2021-03-15 DIAGNOSIS — I130 Hypertensive heart and chronic kidney disease with heart failure and stage 1 through stage 4 chronic kidney disease, or unspecified chronic kidney disease: Secondary | ICD-10-CM | POA: Diagnosis not present

## 2021-03-15 DIAGNOSIS — E039 Hypothyroidism, unspecified: Secondary | ICD-10-CM | POA: Diagnosis not present

## 2021-03-15 DIAGNOSIS — I255 Ischemic cardiomyopathy: Secondary | ICD-10-CM | POA: Diagnosis not present

## 2021-03-15 DIAGNOSIS — E1122 Type 2 diabetes mellitus with diabetic chronic kidney disease: Secondary | ICD-10-CM | POA: Diagnosis not present

## 2021-03-15 DIAGNOSIS — I2510 Atherosclerotic heart disease of native coronary artery without angina pectoris: Secondary | ICD-10-CM | POA: Diagnosis not present

## 2021-03-15 DIAGNOSIS — N1831 Chronic kidney disease, stage 3a: Secondary | ICD-10-CM | POA: Diagnosis not present

## 2021-03-15 DIAGNOSIS — M170 Bilateral primary osteoarthritis of knee: Secondary | ICD-10-CM | POA: Diagnosis not present

## 2021-03-15 DIAGNOSIS — N3281 Overactive bladder: Secondary | ICD-10-CM | POA: Diagnosis not present

## 2021-03-16 ENCOUNTER — Other Ambulatory Visit: Payer: Medicare HMO | Admitting: *Deleted

## 2021-03-16 NOTE — Patient Outreach (Signed)
[facility]) Care Management  03/16/2021  Tila Millirons 1924/11/27 [id number]   Telephone Assessment- Unsuccessful  RN attempted outreach to available contacts today however unsuccessful. RN able to leave a HIPAA approved voice message requesting a call back.  Will attempt another outreach over the next week for pending services.  Raina Mina, RN Care Management Coordinator [place] Office [phone]

## 2021-03-20 DIAGNOSIS — I5020 Unspecified systolic (congestive) heart failure: Secondary | ICD-10-CM | POA: Diagnosis not present

## 2021-03-22 ENCOUNTER — Other Ambulatory Visit: Payer: Self-pay | Admitting: *Deleted

## 2021-03-22 DIAGNOSIS — M170 Bilateral primary osteoarthritis of knee: Secondary | ICD-10-CM | POA: Diagnosis not present

## 2021-03-22 DIAGNOSIS — I255 Ischemic cardiomyopathy: Secondary | ICD-10-CM | POA: Diagnosis not present

## 2021-03-22 DIAGNOSIS — N1831 Chronic kidney disease, stage 3a: Secondary | ICD-10-CM | POA: Diagnosis not present

## 2021-03-22 DIAGNOSIS — E1122 Type 2 diabetes mellitus with diabetic chronic kidney disease: Secondary | ICD-10-CM | POA: Diagnosis not present

## 2021-03-22 DIAGNOSIS — I130 Hypertensive heart and chronic kidney disease with heart failure and stage 1 through stage 4 chronic kidney disease, or unspecified chronic kidney disease: Secondary | ICD-10-CM | POA: Diagnosis not present

## 2021-03-22 DIAGNOSIS — I2510 Atherosclerotic heart disease of native coronary artery without angina pectoris: Secondary | ICD-10-CM | POA: Diagnosis not present

## 2021-03-22 DIAGNOSIS — E039 Hypothyroidism, unspecified: Secondary | ICD-10-CM | POA: Diagnosis not present

## 2021-03-22 DIAGNOSIS — N3281 Overactive bladder: Secondary | ICD-10-CM | POA: Diagnosis not present

## 2021-03-22 DIAGNOSIS — I5022 Chronic systolic (congestive) heart failure: Secondary | ICD-10-CM | POA: Diagnosis not present

## 2021-03-22 DIAGNOSIS — K219 Gastro-esophageal reflux disease without esophagitis: Secondary | ICD-10-CM | POA: Diagnosis not present

## 2021-03-22 NOTE — Patient Outreach (Signed)
[facility]) Care Management  03/22/2021  Marissa Jefferson 1925/03/08 [id number]   Telephone Assessment  Spoke with pt today who remains on track with the discussed plan of care with no acute issues or needs. Updates documented within the plan of care.   Goals Addressed             This Visit's Progress    THN-Comorbidities Identified and Managed   On track    Follow up Date: 04/28/2021 Timeframe:  Long-Range Goal Priority:  Medium Start Date:    07/04/2020                         Expected End Date: 06/09/2021                       Evidence-based guidance:  Assess and address signs/symptoms of comorbidity, including dyslipidemia, diabetes, iron deficiency, gout, arthritis, dysrhythmia, hypertension, cachexia, coronary artery disease, kidney dysfunction and lung disease.  Prepare patient for laboratory and diagnostic exams based on risk and presentation.  Prepare for use of pharmacologic therapy that may include statin, angiotensin converting enzyme (ACE) inhibitor, angiotensin receptor blocker (ARB), beta-blocker, digoxin, antidysrhythmic, diuretic or omega-3 fatty acid.  Monitor side effects and anticipate need for periodic adjustments.  Prepare patient for potential invasive treatment, such as implantable cardioverter-defibrillator, cardiac resynchronization therapy or heart transplant as disease progresses.   Barriers: Health Behaviors Knowledge  Notes: 12/1-Pt continues to manager her ongoing care and currently utilizing [facility] services. Continue to have sufficient hired help for transportation services due to 10 steps into her apartment with no reported falls or issues however pt is concerned about her Lasix medication not eliminating enough fluid. Denies any symptoms with no swelling or related symptoms. RN offered to contact PCP and report her concern. Office will follow up with pt accordingly. 8/1 Pt reports she is managing her ongoing medical issues with no acute  problems and has needed assistance for errands out in the community along with family support. No acute issues reported as pt continue to manager her HF with daily weights reported around 120-121 lbs with no related symptoms. 4/25-Verified all other comorbidities are managed well and continue to encourage with ongoing medication regimen and attendance to all medical appointments post-operatively.      THN-Make and Keep All Appointments   On track    Follow Up Date 04/28/2021 Timeframe:  Short-Term Goal Priority:  Medium Start Date:  07/04/2020                           Expected End Date:  05/09/2021                 - arrange a ride through an agency 1 week before appointment - ask family or friend for a ride - call to cancel if needed - keep a calendar with prescription refill dates - keep a calendar with appointment dates   Barriers: Health Behaviors  Why is this important?   Part of staying healthy is seeing the doctor for follow-up care.  If you forget your appointments, there are some things you can do to stay on track.    Notes: 1/11-Pt reports she currently has hired assistance that helps her with transportation, house cleaning, and runs her errands from groceries, e.g. Pt is able to attend all her schedules appointments with no delays. 12/1-Pt continue adherence with attending all appointments.Reports she has  hired help one week a month for all her appointments. Unable to utilize [employer] due to 10 step to get in and out of her apartment. 11/3-Pt verified she continue to have sufficient transportation assistance through her daughter Webb Silversmith). RN informed pt if she needs future transportation resources to contact [facility] for care-guides assistance for other resources such as SCATs. Aware to reach out the [facility] week prior to the need for transportation if possible. Contact number provided directly to pt today for this assistance. 10/6- Pt verified upcoming appointments and indicated she can only have  appointments on Tues/Thurs in the mornings. States she has several upcoming appointments and will call to reschedule those appointment that are in conflict and rescheduled those appointments. Strongly encouraged pt to call to rescheduled those appointments to avoid fees. 9/1-Pt confirms he has attended all medical appointments with no issues with transportation.  Will continue to offer another source of transportation if needed. 8/1- Pt reports she is keeping all her medical appointments with no delays or missed appointments. 6/7-Verified pt continue to have sufficient transportation to all medical appointments with no delays or related issues.  Pt continue to take all prescribed medications with no needed refills. Will continue to encourage adherence with all above goals and interventions. 4/25-Pt's caregiver reports pt currently has 24/7 family/friends in the home and sufficient transportation available for all medical appointments. Will educate on additional transportation services with [employer] if needed in the future.     COMPLETED: THN-Prevent Falls and Injury   On track    Timeframe:  Short-Term Goal Priority:  High Start Date:   01/12/2021                          Expected End Date:    04/11/2021                Follow Up Date 03/15/2021   - always use handrails on the stairs - always wear low-heeled or flat shoes or slippers with nonskid soles - call the doctor if I am feeling too drowsy - keep a flashlight by the bed - keep my cell phone with me always - learn how to get back up if I fall - make an emergency alert plan in case I fall - pick up clutter from the floors - use a nonslip pad with throw rugs, or remove them completely - use a cane or walker Barriers: Health Behaviors     Why is this important?   Most falls happen when it is hard for you to walk safely. Your balance may be off because of an illness. You may have pain in your knees, hip or other joints.  You may be overly tired  or taking medicines that make you sleepy. You may not be able to see or hear clearly.  Falls can lead to broken bones, bruises or other injuries.  There are things you can do to help prevent falling.     Notes:  12/1- Pt denies any additional issues with falls and reports she is now receiving PT services with [facility]. Pt uses her assistive device at all times both inside and outside the home. Life alert active. 12/3-Due to pt's recent falls and visit to the ED resulting in no injuries strongly encourage pt to use her assistive devices. Verified pt might hired assistance to come in the home several hours throughout the week to assist pt with her ADLs. Pt indicates she has a life alert  but did not use at the time of this incident. Strongly encourage to use the devices as a preventive means for immediate assistance at this time (verbalized an understanding).          Goals Addressed             This Visit's Progress    THN-Comorbidities Identified and Managed   On track    Follow up Date: 04/28/2021 Timeframe:  Long-Range Goal Priority:  Medium Start Date:    07/04/2020                         Expected End Date: 06/09/2021                       Evidence-based guidance:  Assess and address signs/symptoms of comorbidity, including dyslipidemia, diabetes, iron deficiency, gout, arthritis, dysrhythmia, hypertension, cachexia, coronary artery disease, kidney dysfunction and lung disease.  Prepare patient for laboratory and diagnostic exams based on risk and presentation.  Prepare for use of pharmacologic therapy that may include statin, angiotensin converting enzyme (ACE) inhibitor, angiotensin receptor blocker (ARB), beta-blocker, digoxin, antidysrhythmic, diuretic or omega-3 fatty acid.  Monitor side effects and anticipate need for periodic adjustments.  Prepare patient for potential invasive treatment, such as implantable cardioverter-defibrillator, cardiac resynchronization therapy or heart  transplant as disease progresses.   Barriers: Health Behaviors Knowledge  Notes: 12/1-Pt continues to manager her ongoing care and currently utilizing [facility] services. Continue to have sufficient hired help for transportation services due to 10 steps into her apartment with no reported falls or issues however pt is concerned about her Lasix medication not eliminating enough fluid. Denies any symptoms with no swelling or related symptoms. RN offered to contact PCP and report her concern. Office will follow up with pt accordingly. 8/1 Pt reports she is managing her ongoing medical issues with no acute problems and has needed assistance for errands out in the community along with family support. No acute issues reported as pt continue to manager her HF with daily weights reported around 120-121 lbs with no related symptoms. 4/25-Verified all other comorbidities are managed well and continue to encourage with ongoing medication regimen and attendance to all medical appointments post-operatively.      THN-Make and Keep All Appointments   On track    Follow Up Date 04/28/2021 Timeframe:  Short-Term Goal Priority:  Medium Start Date:  07/04/2020                           Expected End Date:  05/09/2021                 - arrange a ride through an agency 1 week before appointment - ask family or friend for a ride - call to cancel if needed - keep a calendar with prescription refill dates - keep a calendar with appointment dates   Barriers: Health Behaviors  Why is this important?   Part of staying healthy is seeing the doctor for follow-up care.  If you forget your appointments, there are some things you can do to stay on track.    Notes: 1/11-Pt reports she currently has hired assistance that helps her with transportation, house cleaning, and runs her errands from groceries, e.g. Pt is able to attend all her schedules appointments with no delays. 12/1-Pt continue adherence with attending all  appointments.Reports she has hired help one week a month for all  her appointments. Unable to utilize [facility] due to 10 step to get in and out of her apartment. 11/3-Pt verified she continue to have sufficient transportation assistance through her daughter Webb Silversmith). RN informed pt if she needs future transportation resources to contact [facility] for care-guides assistance for other resources such as SCATs. Aware to reach out the [facility] week prior to the need for transportation if possible. Contact number provided directly to pt today for this assistance. 10/6- Pt verified upcoming appointments and indicated she can only have appointments on Tues/Thurs in the mornings. States she has several upcoming appointments and will call to reschedule those appointment that are in conflict and rescheduled those appointments. Strongly encouraged pt to call to rescheduled those appointments to avoid fees. 9/1-Pt confirms he has attended all medical appointments with no issues with transportation.  Will continue to offer another source of transportation if needed. 8/1- Pt reports she is keeping all her medical appointments with no delays or missed appointments. 6/7-Verified pt continue to have sufficient transportation to all medical appointments with no delays or related issues.  Pt continue to take all prescribed medications with no needed refills. Will continue to encourage adherence with all above goals and interventions. 4/25-Pt's caregiver reports pt currently has 24/7 family/friends in the home and sufficient transportation available for all medical appointments. Will educate on additional transportation services with [employer] if needed in the future.     COMPLETED: THN-Prevent Falls and Injury   On track    Timeframe:  Short-Term Goal Priority:  High Start Date:   01/12/2021                          Expected End Date:    04/11/2021                Follow Up Date 03/15/2021   - always use handrails on the stairs - always wear  low-heeled or flat shoes or slippers with nonskid soles - call the doctor if I am feeling too drowsy - keep a flashlight by the bed - keep my cell phone with me always - learn how to get back up if I fall - make an emergency alert plan in case I fall - pick up clutter from the floors - use a nonslip pad with throw rugs, or remove them completely - use a cane or walker Barriers: Health Behaviors     Why is this important?   Most falls happen when it is hard for you to walk safely. Your balance may be off because of an illness. You may have pain in your knees, hip or other joints.  You may be overly tired or taking medicines that make you sleepy. You may not be able to see or hear clearly.  Falls can lead to broken bones, bruises or other injuries.  There are things you can do to help prevent falling.     Notes:  12/1- Pt denies any additional issues with falls and reports she is now receiving PT services with [facility]. Pt uses her assistive device at all times both inside and outside the home. Life alert active. 12/3-Due to pt's recent falls and visit to the ED resulting in no injuries strongly encourage pt to use her assistive devices. Verified pt might hired assistance to come in the home several hours throughout the week to assist pt with her ADLs. Pt indicates she has a life alert but did not use at the time of this  incident. Strongly encourage to use the devices as a preventive means for immediate assistance at this time (verbalized an understanding).         Raina Mina, RN Care Management Coordinator [place] Office [phone]

## 2021-03-29 DIAGNOSIS — E039 Hypothyroidism, unspecified: Secondary | ICD-10-CM | POA: Diagnosis not present

## 2021-03-29 DIAGNOSIS — N3281 Overactive bladder: Secondary | ICD-10-CM | POA: Diagnosis not present

## 2021-03-29 DIAGNOSIS — K219 Gastro-esophageal reflux disease without esophagitis: Secondary | ICD-10-CM | POA: Diagnosis not present

## 2021-03-29 DIAGNOSIS — N1831 Chronic kidney disease, stage 3a: Secondary | ICD-10-CM | POA: Diagnosis not present

## 2021-03-29 DIAGNOSIS — I130 Hypertensive heart and chronic kidney disease with heart failure and stage 1 through stage 4 chronic kidney disease, or unspecified chronic kidney disease: Secondary | ICD-10-CM | POA: Diagnosis not present

## 2021-03-29 DIAGNOSIS — I5022 Chronic systolic (congestive) heart failure: Secondary | ICD-10-CM | POA: Diagnosis not present

## 2021-03-29 DIAGNOSIS — I255 Ischemic cardiomyopathy: Secondary | ICD-10-CM | POA: Diagnosis not present

## 2021-03-29 DIAGNOSIS — M170 Bilateral primary osteoarthritis of knee: Secondary | ICD-10-CM | POA: Diagnosis not present

## 2021-03-29 DIAGNOSIS — E1122 Type 2 diabetes mellitus with diabetic chronic kidney disease: Secondary | ICD-10-CM | POA: Diagnosis not present

## 2021-03-29 DIAGNOSIS — I2510 Atherosclerotic heart disease of native coronary artery without angina pectoris: Secondary | ICD-10-CM | POA: Diagnosis not present

## 2021-04-20 DIAGNOSIS — I5020 Unspecified systolic (congestive) heart failure: Secondary | ICD-10-CM | POA: Diagnosis not present

## 2021-04-28 ENCOUNTER — Other Ambulatory Visit: Payer: Self-pay | Admitting: *Deleted

## 2021-04-28 NOTE — Patient Instructions (Signed)
Visit Information  Thank you for taking time to visit with me today. Please don't hesitate to contact me if I can be of assistance to you before our next scheduled telephone appointment.  Following are the goals we discussed today:   Take all medications as prescribed Attend all scheduled provider appointments Call pharmacy for medication refills 3-7 days in advance of running out of medications Attend church or other social activities Perform all self care activities independently  Perform IADL's (shopping, preparing meals, housekeeping, managing finances) independently Call provider office for new concerns or questions  call office if I gain more than 2 pounds in one day or 5 pounds in one week do ankle pumps when sitting keep legs up while sitting track weight in diary use salt in moderation watch for swelling in feet, ankles and legs every day weigh myself daily begin a heart failure diary bring diary to all appointments develop a rescue plan follow rescue plan if symptoms flare-up eat more whole grains, fruits and vegetables, lean meats and healthy fats track symptoms and what helps feel better or worse

## 2021-04-28 NOTE — Patient Outreach (Signed)
[facility]) Care Management Telephonic RN Care Manager Note   04/28/2021 Name:  Marissa Jefferson MRN:  [id number] DOB:  Mar 12, 1925   Recommendations/Changes made from today's visit: All updates documented with the plan of care.  Subjective: Marissa Jefferson is an 88 y.o. year old female who is a primary patient of Merrilee Seashore, MD. The care management team was consulted for assistance with care management and/or care coordination needs.    Telephonic RN Care Manager completed Telephone Visit today.  Objective:   Medications Reviewed Today     Reviewed by Rush Landmark, [place] (Certified Medical Assistant) on 11/18/20 at 1321  Med List Status: <None>   Medication Order Taking? Sig Documenting Provider Last Dose Status Informant  acetaminophen (TYLENOL) 500 MG tablet [id number] Yes Take 1,000 mg by mouth See admin instructions. Take 1,000 mg by mouth in the morning & at bedtime and an additional 500 mg once a day as needed for pain/discomfort [provider] Taking Active Multiple Informants  amLODipine (NORVASC) 10 MG tablet [id number] Yes TAKE 1 TABLET BY MOUTH EVERY DAY  Patient taking differently: Take 10 mg by mouth daily.   Croitoru, Mihai, MD Taking Active            Med Note Duffy Bruce, Legrand Como   Fri Jun 17, 2020  5:07 PM) Per "Lattie Haw" at MD Croitoru's office, the patient, per her notes, was still on both Amlodipine AND Lisinopril (as of 02/2020)  aspirin EC 81 MG tablet [id number] Yes Take 1 tablet (81 mg total) by mouth daily. Croitoru, Mihai, MD Taking Active Multiple Informants  atorvastatin (LIPITOR) 80 MG tablet [id number] Yes Take 80 mg by mouth daily. [provider] Taking Active Multiple Informants           Med Note Anette Riedel Oct 05, 2013 12:32 PM)    B Complex-Biotin-FA (SUPER B-50 COMPLEX PO) [id number] Yes Take 1 tablet by mouth daily before lunch. [provider] Taking Active Multiple Informants  Biotin 5000 MCG  SUBL [id number] Yes Place 5,000 mcg under the tongue daily. [provider] Taking Active Multiple Informants  carvedilol (COREG) 25 MG tablet [id number] Yes TAKE 1 TABLET BY MOUTH TWICE A DAY WITH MEALS  Patient taking differently: Take 25 mg by mouth 2 (two) times daily with a meal.   Croitoru, Mihai, MD Taking Active            Med Note Duffy Bruce, Legrand Como   Fri Jun 17, 2020  4:25 PM) Morning and Afternoon  Cholecalciferol (VITAMIN D3) 125 MCG (5000 UT) CAPS [id number] Yes Take 5,000 Units by mouth daily before lunch. [provider] Taking Active Multiple Informants  Cyanocobalamin (VITAMIN B-12) 2500 MCG SUBL [id number] Yes Place 2,500 mcg under the tongue daily before lunch. [provider] Taking Active Multiple Informants  denosumab (PROLIA) 60 MG/ML SOSY injection [id number] Yes Inject 60 mg into the skin every 6 (six) months. [provider] Taking Active Multiple Informants  docusate sodium (COLACE) 100 MG capsule [id number] Yes Take 100 mg by mouth See admin instructions. Take 100 mg by mouth at bedtime and HOLD FOR DIARRHEA [provider] Taking Active Multiple Informants  furosemide (LASIX) 40 MG tablet [id number]  Take 0.5 tablets (20 mg total) by mouth daily. Deberah Pelton, NP  Expired 08/18/20 2359   levothyroxine (SYNTHROID, LEVOTHROID) 150 MCG tablet [id number] Yes Take 150 mcg by mouth daily before breakfast. [provider] Taking Active Multiple Informants  Med Note Duffy Bruce, Legrand Como   Fri Jun 17, 2020  1:58 PM)    lisinopril (ZESTRIL) 10 MG tablet [id number] Yes TAKE 1 TABLET BY MOUTH EVERY DAY Croitoru, Mihai, MD Taking Active   Magnesium Oxide 400 (240 MG) MG TABS [id number] Yes TAKE 1 TABLET TWICE DAILY.  Patient taking differently: Take 400 mg by mouth daily.   Croitoru, Mihai, MD Taking Active            Med Note Duffy Bruce, Legrand Como   Fri Jun 17, 2020  4:06 PM) Morning and Afternoon  metFORMIN (GLUCOPHAGE) 500 MG tablet  [id number] Yes Take 1,000 mg by mouth 2 (two) times daily. [provider] Taking Active Multiple Informants           Med Note Duffy Bruce, Legrand Como   Fri Jun 17, 2020  4:08 PM) Morning and Afternoon    Discontinued 02/25/15 1541   protein supplement shake (PREMIER PROTEIN) LIQD [id number] Yes Take 237 mLs by mouth in the morning. [provider] Taking Active Multiple Informants  solifenacin (VESICARE) 10 MG tablet [id number] Yes Take 10 mg by mouth at bedtime. [provider] Taking Active Multiple Informants  traMADol (ULTRAM) 50 MG tablet [id number] Yes Take 50 mg by mouth 3 (three) times daily as needed for moderate pain. [provider] Taking Active Multiple Informants  VOLTAREN 1 % GEL [id number] Yes Apply 2 g topically daily as needed (for bilateral knee pain). [provider] Taking Active Multiple Informants           Med Note Anette Riedel Oct 05, 2013 12:31 PM)               SDOH:  (Social Determinants of Health) assessments and interventions performed:     Care Plan  Review of patient past medical history, allergies, medications, health status, including review of consultants reports, laboratory and other test data, was performed as part of comprehensive evaluation for care management services.   Care Plan : Heart Failure (Adult)  Updates made by Tobi Bastos, RN since 04/28/2021 12:00 AM     Problem: Symptom Exacerbation (Heart Failure)   Priority: Medium     Goal: Symptom Exacerbation Prevented or Minimized Completed 04/28/2021  Start Date: 07/04/2020  Expected End Date: 05/09/2021  Recent Progress: On track  Priority: Medium  Note:   Evidence-based guidance:  Perform or review cognitive and/or health literacy screening.  Assess understanding of adherence and barriers to treatment plan, as well as lifestyle changes; develop strategies to address barriers.  Establish a mutually-agreed-upon early intervention  process to communicate with primary care provider when signs/symptoms worsen.  Facilitate timely posthospital discharge or emergency department treatment that includes intensive follow-up via telephone calls, home visit, telehealth monitoring and care at multidisciplinary heart failure clinic.  Adjust frequency and intensity of follow-up based on presentation, number of emergency department visits, hospital admissions and frequency and severity of symptom exacerbation.  Facilitate timely visit, usually within 1 week, with primary care provider following hospital discharge.  Collaborate with clinical pharmacist to address adverse drug reactions, drug interactions, subtherapeutic dosage, patient and family education.  Regularly screen for presence of depressive symptoms using a validated tool; consider pharmacologic therapy and/or referral for cognitive behavioral therapy when present.  Refer to community-based services, such as a heart failure support group, community Economist or peer support program.  Review immunization status; arrange receipt of needed vaccinations.  Prepare patient for home oxygen use based on signs/symptoms.  Notes:  Resolving due to duplicate goal 8/41/3244    Task: Identify and Minimize Risk of Heart Failure Exacerbation Completed 04/28/2021  Due Date: 05/09/2021  Priority: Routine  Note:   Care Management Activities:    - barriers to lifestyle changes reviewed and addressed - barriers to treatment reviewed and addressed - cognitive screening completed and reviewed - depression screen reviewed - health literacy screening completed or reviewed - healthy lifestyle promoted - medication-adherence assessment completed - rescue (action) plan developed - rescue (action) plan reviewed - self-awareness of signs/symptoms of worsening disease encouraged    Notes:  1/11-Verified pt continues to do well with no acute symptoms over the last month. Pt denies any related  issues and states she continues to work with [place] services. 12/1-Pt continues to report she remains at her baseline weight of 120 lbs on her daily weights. No acute issues as pt verified she remains in the GREEN zone with her CHF. Pr taking Lasix but feels it not working. RN contacted PCP office with this inquire and requested office call pt for possible interventions. 11/3-Verified pt remains at her baseline weight at 120 lbs with no swelling. Pt continues to remain in the GREEN zone with no acute issues related to her HF. 10/6-Pt reports her weights at baseline 121 lbs and denies any symptoms of swelling. Pt remains in the GREEN zone and continue to lower her risk with ongoing self monitoring and management of care.  9/1-Continues to reports her weights at baseline 120 lbs with no residual and acute symptoms. Pt remains in the GREEN zone. NO needs at this time. Will continue to encourage adherence. 8/1- Pt reports she continues to do well with weights around her baseline 120-121 lbs with no related symptoms. Verified no additional needs at this time as pt has assistance.  6/7-Will continue to stress the importance of daily monitoring of weights and monitoring to prevent acute events related to HF exacerbation. Verified pt is aware of system management and what to do if acute symptoms should occur. 4/25 Discussed with primary caregiver daughter on pt's ongoing monitoring and prevention measures related to daily monitoring of her HF. Verified pt weighs daily and aware of when to continue her provider when more then 2 lbs overweight with fluid retention within one night. Also verified caregiver's understanding of the HF zones and verified pt remains in the GREEN zone with no acute events or issues. Will continue to encourage ongoing daily monitoring.    Problem: Disease Progression (Heart Failure)   Priority: Medium     Long-Range Goal: Comorbidities Identified and Managed Completed 04/28/2021  Start  Date: 07/04/2020  Expected End Date: 06/09/2021  Recent Progress: On track  Priority: Medium  Note:   Evidence-based guidance:  Assess and address signs/symptoms of comorbidity, including dyslipidemia, diabetes, iron deficiency, gout, arthritis, dysrhythmia, hypertension, cachexia, coronary artery disease, kidney dysfunction and lung disease.  Prepare patient for laboratory and diagnostic exams based on risk and presentation.  Prepare for use of pharmacologic therapy that may include statin, angiotensin converting enzyme (ACE) inhibitor, angiotensin receptor blocker (ARB), beta-blocker, digoxin, antidysrhythmic, diuretic or omega-3 fatty acid.  Monitor side effects and anticipate need for periodic adjustments.  Prepare patient for potential invasive treatment, such as implantable cardioverter-defibrillator, cardiac resynchronization therapy or heart transplant as disease progresses.   Notes:   Resolving due to duplicate goal  0/12/2723    Task: dentify and Manage Comorbidities Completed 04/28/2021  Due Date: 06/09/2021  Priority: Routine  Note:   Care  Management Activities:    - medication side effects monitored and managed - signs/symptoms of comorbidities identified    Notes: 12/1-Pt reports she takes Lasix at night and indicates she does not urinate as expected and has some concerns. RN offered to contact her primary provider with this information (receptive). Office will reach out to the pt directly. 9/1-Pt remains asymptomatic and continue to manage her comorbidities with no acute issues. 8/1-No comorbidities reported as pt continue to manage all conditional with no acute needs.  4/25-Discussed other ongoing medical issues and verified all under control with medication and monitoring. Will stress the importance managing these condition to avoid acute events or problematic symptoms related.    Care Plan : RN Care Manager Plan of Care  Updates made by Tobi Bastos, RN since  04/28/2021 12:00 AM     Problem: Knowledge Deficit related to CHF Management and care coordination needs   Priority: High     Long-Range Goal: Development of a Plan of Care for management of CHF   Start Date: 04/28/2021  Expected End Date: 10/09/2021  Priority: High  Note:   Current Barriers:  Knowledge Deficits related to plan of care for management of CHF   RNCM Clinical Goal(s):  Patient will verbalize basic understanding of  CHF disease process and self health management plan as evidenced by Self reporting take all medications exactly as prescribed and will call provider for medication related questions as evidenced by self report, chart review and  through collaboration with RN Care manager, provider, and care team.   Interventions: Inter-disciplinary care team collaboration (see longitudinal plan of care) Evaluation of current treatment plan related to  self management and patient's adherence to plan as established by provider   Heart Failure Interventions:  (Status:  New goal.) Long Term Goal Basic overview and discussion of pathophysiology of Heart Failure reviewed Provided education on low sodium diet Reviewed Heart Failure Action Plan in depth and provided written copy Assessed need for readable accurate scales in home Provided education about placing scale on hard, flat surface Advised patient to weigh each morning after emptying bladder Discussed importance of daily weight and advised patient to weigh and record daily Reviewed role of diuretics in prevention of fluid overload and management of heart failure; Discussed the importance of keeping all appointments with provider Provided patient with education about the role of exercise in the management of heart failure Screening for signs and symptoms of depression related to chronic disease state   Patient Goals/Self-Care Activities: Take all medications as prescribed Attend all scheduled provider appointments Call  pharmacy for medication refills 3-7 days in advance of running out of medications Attend church or other social activities Perform all self care activities independently  Perform IADL's (shopping, preparing meals, housekeeping, managing finances) independently Call provider office for new concerns or questions  call office if I gain more than 2 pounds in one day or 5 pounds in one week do ankle pumps when sitting keep legs up while sitting track weight in diary use salt in moderation watch for swelling in feet, ankles and legs every day weigh myself daily begin a heart failure diary bring diary to all appointments develop a rescue plan follow rescue plan if symptoms flare-up eat more whole grains, fruits and vegetables, lean meats and healthy fats track symptoms and what helps feel better or worse  Follow Up Plan:  Telephone follow up appointment with care management team member scheduled for:  March 2023 The patient has been  provided with contact information for the care management team and has been advised to call with any health related questions or concerns.       Raina Mina, RN Care Management Coordinator [place] Office [phone]

## 2021-05-03 DIAGNOSIS — I2510 Atherosclerotic heart disease of native coronary artery without angina pectoris: Secondary | ICD-10-CM | POA: Diagnosis not present

## 2021-05-03 DIAGNOSIS — R600 Localized edema: Secondary | ICD-10-CM | POA: Diagnosis not present

## 2021-05-03 DIAGNOSIS — I10 Essential (primary) hypertension: Secondary | ICD-10-CM | POA: Diagnosis not present

## 2021-05-03 DIAGNOSIS — E119 Type 2 diabetes mellitus without complications: Secondary | ICD-10-CM | POA: Diagnosis not present

## 2021-05-03 DIAGNOSIS — M1990 Unspecified osteoarthritis, unspecified site: Secondary | ICD-10-CM | POA: Diagnosis not present

## 2021-05-03 DIAGNOSIS — E039 Hypothyroidism, unspecified: Secondary | ICD-10-CM | POA: Diagnosis not present

## 2021-05-03 DIAGNOSIS — J449 Chronic obstructive pulmonary disease, unspecified: Secondary | ICD-10-CM | POA: Diagnosis not present

## 2021-05-03 DIAGNOSIS — R0902 Hypoxemia: Secondary | ICD-10-CM | POA: Diagnosis not present

## 2021-05-03 DIAGNOSIS — E785 Hyperlipidemia, unspecified: Secondary | ICD-10-CM | POA: Diagnosis not present

## 2021-05-03 DIAGNOSIS — R32 Unspecified urinary incontinence: Secondary | ICD-10-CM | POA: Diagnosis not present

## 2021-05-03 DIAGNOSIS — G3184 Mild cognitive impairment, so stated: Secondary | ICD-10-CM | POA: Diagnosis not present

## 2021-05-03 DIAGNOSIS — Z008 Encounter for other general examination: Secondary | ICD-10-CM | POA: Diagnosis not present

## 2021-05-03 DIAGNOSIS — K5900 Constipation, unspecified: Secondary | ICD-10-CM | POA: Diagnosis not present

## 2021-05-15 ENCOUNTER — Other Ambulatory Visit: Payer: Self-pay | Admitting: Cardiovascular Disease

## 2021-05-18 DIAGNOSIS — I5020 Unspecified systolic (congestive) heart failure: Secondary | ICD-10-CM | POA: Diagnosis not present

## 2021-05-24 ENCOUNTER — Other Ambulatory Visit: Payer: Self-pay | Admitting: *Deleted

## 2021-05-24 NOTE — Patient Outreach (Signed)
?[place] [facility]) Care Management ?Telephonic RN Care Manager Note ? ? ?05/24/2021 ?Name:  Marissa Jefferson MRN:  [id number] DOB:  [birth year] ? ?Summary: ?No acute issues to address as pt remains in the GREEN zone at 121 lbs. ? ?Recommendations/Changes made from today's visit: ?Will continue to encouraged adherent with the plan of care. ? ?Subjective: ?Marissa Jefferson is an 88 y.o. year old female who is a primary patient of Merrilee Seashore, MD. The care management team was consulted for assistance with care management and/or care coordination needs.   ? ?Telephonic RN Care Manager completed Telephone Visit today. ? ?Objective:  ? ?Medications Reviewed Today   ? ? Reviewed by Rush Landmark, [place] (Certified Medical Assistant) on 11/18/20 at 1321  Med List Status: <None>  ? ?Medication Order Taking? Sig Documenting Provider Last Dose Status Informant  ?acetaminophen (TYLENOL) 500 MG tablet [id number] Yes Take 1,000 mg by mouth See admin instructions. Take 1,000 mg by mouth in the morning & at bedtime and an additional 500 mg once a day as needed for pain/discomfort [provider] Taking Active Multiple Informants  ?amLODipine (NORVASC) 10 MG tablet [id number] Yes TAKE 1 TABLET BY MOUTH EVERY DAY  ?Patient taking differently: Take 10 mg by mouth daily.  ? Croitoru, Dani Gobble, MD Taking Active   ?         ?Med Note Duffy Bruce, Legrand Como   Fri Jun 17, 2020  5:07 PM) Per "Lattie Haw" at MD Croitoru's office, the patient, per her notes, was still on both Amlodipine AND Lisinopril (as of 02/2020)  ?aspirin EC 81 MG tablet [id number] Yes Take 1 tablet (81 mg total) by mouth daily. Croitoru, Mihai, MD Taking Active Multiple Informants  ?atorvastatin (LIPITOR) 80 MG tablet [id number] Yes Take 80 mg by mouth daily. [provider] Taking Active Multiple Informants  ?         ?Med Note Anette Riedel Oct 05, 2013 12:32 PM)    ?B Complex-Biotin-FA (SUPER B-50 COMPLEX PO) [id number] Yes Take 1 tablet by mouth  daily before lunch. [provider] Taking Active Multiple Informants  ?Biotin 5000 MCG SUBL [id number] Yes Place 5,000 mcg under the tongue daily. [provider] Taking Active Multiple Informants  ?carvedilol (COREG) 25 MG tablet [id number] Yes TAKE 1 TABLET BY MOUTH TWICE A DAY WITH MEALS  ?Patient taking differently: Take 25 mg by mouth 2 (two) times daily with a meal.  ? Croitoru, Dani Gobble, MD Taking Active   ?         ?Med Note Duffy Bruce, Legrand Como   Fri Jun 17, 2020  4:25 PM) Morning and Afternoon  ?Cholecalciferol (VITAMIN D3) 125 MCG (5000 UT) CAPS [id number] Yes Take 5,000 Units by mouth daily before lunch. [provider] Taking Active Multiple Informants  ?Cyanocobalamin (VITAMIN B-12) 2500 MCG SUBL [id number] Yes Place 2,500 mcg under the tongue daily before lunch. [provider] Taking Active Multiple Informants  ?denosumab (PROLIA) 60 MG/ML SOSY injection [id number] Yes Inject 60 mg into the skin every 6 (six) months. [provider] Taking Active Multiple Informants  ?docusate sodium (COLACE) 100 MG capsule [id number] Yes Take 100 mg by mouth See admin instructions. Take 100 mg by mouth at bedtime and HOLD FOR DIARRHEA [provider] Taking Active Multiple Informants  ?furosemide (LASIX) 40 MG tablet [id number]  Take 0.5 tablets (20 mg total) by mouth daily. Deberah Pelton, NP  Expired 08/18/20 2359   ?levothyroxine (SYNTHROID, LEVOTHROID) 150 MCG tablet [id number] Yes Take  150 mcg by mouth daily before breakfast. [provider] Taking Active Multiple Informants  ?         ?Med Note Duffy Bruce, Legrand Como   Fri Jun 17, 2020  1:58 PM)    ?lisinopril (ZESTRIL) 10 MG tablet [id number] Yes TAKE 1 TABLET BY MOUTH EVERY DAY Croitoru, Mihai, MD Taking Active   ?Magnesium Oxide 400 (240 MG) MG TABS [id number] Yes TAKE 1 TABLET TWICE DAILY.  ?Patient taking differently: Take 400 mg by mouth daily.  ? Croitoru, Dani Gobble, MD Taking Active   ?         ?Med Note Duffy Bruce, Legrand Como    Fri Jun 17, 2020  4:06 PM) Morning and Afternoon  ?metFORMIN (GLUCOPHAGE) 500 MG tablet [id number] Yes Take 1,000 mg by mouth 2 (two) times daily. [provider] Taking Active Multiple Informants  ?         ?Med Note Duffy Bruce, Legrand Como   Fri Jun 17, 2020  4:08 PM) Morning and Afternoon  ?  Discontinued 02/25/15 1541   ?protein supplement shake (PREMIER PROTEIN) LIQD [id number] Yes Take 237 mLs by mouth in the morning. [provider] Taking Active Multiple Informants  ?solifenacin (VESICARE) 10 MG tablet [id number] Yes Take 10 mg by mouth at bedtime. [provider] Taking Active Multiple Informants  ?traMADol (ULTRAM) 50 MG tablet [id number] Yes Take 50 mg by mouth 3 (three) times daily as needed for moderate pain. [provider] Taking Active Multiple Informants  ?VOLTAREN 1 % GEL [id number] Yes Apply 2 g topically daily as needed (for bilateral knee pain). [provider] Taking Active Multiple Informants  ?         ?Med Note Anette Riedel Oct 05, 2013 12:31 PM)    ? ?  ?  ? ?  ? ? ? ?SDOH:  (Social Determinants of Health) assessments and interventions performed:  ? ? ? ?Care Plan ? ?Review of patient past medical history, allergies, medications, health status, including review of consultants reports, laboratory and other test data, was performed as part of comprehensive evaluation for care management services.  ? ?Care Plan : RN Care Manager Plan of Care  ?Updates made by Tobi Bastos, RN since 05/24/2021 12:00 AM  ?  ? ?Problem: Knowledge Deficit related to CHF Management and care coordination needs   ?Priority: High  ?  ? ?Long-Range Goal: Development of a Plan of Care for management of CHF   ?Start Date: 04/28/2021  ?Expected End Date: 10/09/2021  ?This Visit's Progress: On track  ?Priority: High  ?Note:   ?Current Barriers:  ?Knowledge Deficits related to plan of care for management of CHF  ? ?RNCM Clinical Goal(s):  ?Patient will verbalize basic  understanding of  CHF disease process and self health management plan as evidenced by Self reporting ?take all medications exactly as prescribed and will call provider for medication related questions as evidenced by self report, chart review and  through collaboration with RN Care manager, provider, and care team.  ? ?Interventions: ?Inter-disciplinary care team collaboration (see longitudinal plan of care) ?Evaluation of current treatment plan related to  self management and patient's adherence to plan as established by provider ? ? ?Heart Failure Interventions:  (Status:  Goal on track:  Yes.) Long Term Goal ?Basic overview and discussion of pathophysiology of Heart Failure reviewed ?Provided education on low sodium diet ?Reviewed Heart Failure Action Plan in depth and provided written copy ?Assessed need for readable accurate scales  in home ?Provided education about placing scale on hard, flat surface ?Advised patient to weigh each morning after emptying bladder ?Discussed importance of daily weight and advised patient to weigh and record daily ?Reviewed role of diuretics in prevention of fluid overload and management of heart failure; ?Discussed the importance of keeping all appointments with provider ?Provided patient with education about the role of exercise in the management of heart failure ?Screening for signs and symptoms of depression related to chronic disease state  ? ?Update 3/15- Spoke with pt today who reports she is doing well remaining in the GREEN zone with a weight of 121 lbs with no reported symptoms or issues. Plan of care discussed  and encouraged pt to continue to be adherent. No other needs encountered to address at this time. ? ?Patient Goals/Self-Care Activities: ?Take all medications as prescribed ?Attend all scheduled provider appointments ?Call pharmacy for medication refills 3-7 days in advance of running out of medications ?Attend church or other social activities ?Perform all self  care activities independently  ?Perform IADL's (shopping, preparing meals, housekeeping, managing finances) independently ?Call provider office for new concerns or questions  ?call office if I gain more than 2 pounds

## 2021-06-12 DIAGNOSIS — H1132 Conjunctival hemorrhage, left eye: Secondary | ICD-10-CM | POA: Diagnosis not present

## 2021-06-18 DIAGNOSIS — I5020 Unspecified systolic (congestive) heart failure: Secondary | ICD-10-CM | POA: Diagnosis not present

## 2021-06-26 ENCOUNTER — Other Ambulatory Visit: Payer: Self-pay | Admitting: *Deleted

## 2021-06-26 NOTE — Patient Outreach (Signed)
[place] [facility]) Care Management ? ?06/26/2021 ? ?Nyema Brancato ?1924/03/22 ?[id number] ? ? ?Telephone Assessment-Unsuccessful ? ?RN attempted outreach call however however unsuccessful and unable to leave a HIPAA approved voice message. ? ?Will rescheduled another outreach call over the next week. ? ?Raina Mina, RN ?Care Management Coordinator ?[place] ?Main Office [phone]  ?

## 2021-06-29 ENCOUNTER — Other Ambulatory Visit: Payer: Self-pay | Admitting: *Deleted

## 2021-06-29 DIAGNOSIS — N1831 Chronic kidney disease, stage 3a: Secondary | ICD-10-CM | POA: Diagnosis not present

## 2021-06-29 DIAGNOSIS — I129 Hypertensive chronic kidney disease with stage 1 through stage 4 chronic kidney disease, or unspecified chronic kidney disease: Secondary | ICD-10-CM | POA: Diagnosis not present

## 2021-06-29 DIAGNOSIS — E1122 Type 2 diabetes mellitus with diabetic chronic kidney disease: Secondary | ICD-10-CM | POA: Diagnosis not present

## 2021-06-29 DIAGNOSIS — I5022 Chronic systolic (congestive) heart failure: Secondary | ICD-10-CM | POA: Diagnosis not present

## 2021-06-29 DIAGNOSIS — R5383 Other fatigue: Secondary | ICD-10-CM | POA: Diagnosis not present

## 2021-06-29 DIAGNOSIS — M810 Age-related osteoporosis without current pathological fracture: Secondary | ICD-10-CM | POA: Diagnosis not present

## 2021-06-29 NOTE — Patient Outreach (Signed)
[place] [facility]) Care Management ? ?06/29/2021 ? ?Anaiah Ertle ?03-14-1924 ?[id number] ? ?Telephone Assessment-unsuccessful ? ?RN attempted outreach call today however unsuccessful and unable to leave a HIPAA voice message. RN will attempt another outreach call over the next week for ongoing [facility] services. ? ?Will send outreach letter requesting a call back. ? ?Raina Mina, RN ?Care Management Coordinator ?[place] ?Main Office [phone]  ? ?

## 2021-07-04 ENCOUNTER — Other Ambulatory Visit: Payer: Self-pay | Admitting: *Deleted

## 2021-07-04 NOTE — Patient Outreach (Signed)
?[facility]) Care Management ?Telephonic RN Care Manager Note ? ? ?07/04/2021 ?Name:  Marissa Jefferson MRN:  [id number] DOB:  22-Nov-1924 ? ?Summary: ?Pt continues to be on track and maintain her weights at baseline of 120 lbs with no fluid retention of related symptoms. Will also send primary quarterly review of pt's disposition with [facility] services. ? ?Recommendations/Changes made from today's visit: ?Will continue to encouraged ongoing adherence with the discussed plan of care for HF management.  ? ?Subjective: ?Marissa Jefferson is an 88 y.o. year old female who is a primary patient of Merrilee Seashore, MD. The care management team was consulted for assistance with care management and/or care coordination needs.   ? ?Telephonic RN Care Manager completed Telephone Visit today. ? ?Objective:  ? ?Medications Reviewed Today   ? ? Reviewed by Rush Landmark, [place] (Certified Medical Assistant) on 11/18/20 at 1321  Med List Status: <None>  ? ?Medication Order Taking? Sig Documenting Provider Last Dose Status Informant  ?acetaminophen (TYLENOL) 500 MG tablet [id number] Yes Take 1,000 mg by mouth See admin instructions. Take 1,000 mg by mouth in the morning & at bedtime and an additional 500 mg once a day as needed for pain/discomfort [provider] Taking Active Multiple Informants  ?amLODipine (NORVASC) 10 MG tablet [id number] Yes TAKE 1 TABLET BY MOUTH EVERY DAY  ?Patient taking differently: Take 10 mg by mouth daily.  ? Croitoru, Dani Gobble, MD Taking Active   ?         ?Med Note Duffy Bruce, Legrand Como   Fri Jun 17, 2020  5:07 PM) Per "Lattie Haw" at MD Croitoru's office, the patient, per her notes, was still on both Amlodipine AND Lisinopril (as of 02/2020)  ?aspirin EC 81 MG tablet [id number] Yes Take 1 tablet (81 mg total) by mouth daily. Croitoru, Mihai, MD Taking Active Multiple Informants  ?atorvastatin (LIPITOR) 80 MG tablet [id number] Yes Take 80 mg by mouth daily. [provider] Taking Active Multiple  Informants  ?         ?Med Note Anette Riedel Oct 05, 2013 12:32 PM)    ?B Complex-Biotin-FA (SUPER B-50 COMPLEX PO) [id number] Yes Take 1 tablet by mouth daily before lunch. [provider] Taking Active Multiple Informants  ?Biotin 5000 MCG SUBL [id number] Yes Place 5,000 mcg under the tongue daily. [provider] Taking Active Multiple Informants  ?carvedilol (COREG) 25 MG tablet [id number] Yes TAKE 1 TABLET BY MOUTH TWICE A DAY WITH MEALS  ?Patient taking differently: Take 25 mg by mouth 2 (two) times daily with a meal.  ? Croitoru, Dani Gobble, MD Taking Active   ?         ?Med Note Duffy Bruce, Legrand Como   Fri Jun 17, 2020  4:25 PM) Morning and Afternoon  ?Cholecalciferol (VITAMIN D3) 125 MCG (5000 UT) CAPS [id number] Yes Take 5,000 Units by mouth daily before lunch. [provider] Taking Active Multiple Informants  ?Cyanocobalamin (VITAMIN B-12) 2500 MCG SUBL [id number] Yes Place 2,500 mcg under the tongue daily before lunch. [provider] Taking Active Multiple Informants  ?denosumab (PROLIA) 60 MG/ML SOSY injection [id number] Yes Inject 60 mg into the skin every 6 (six) months. [provider] Taking Active Multiple Informants  ?docusate sodium (COLACE) 100 MG capsule [id number] Yes Take 100 mg by mouth See admin instructions. Take 100 mg by mouth at bedtime and HOLD FOR DIARRHEA [provider] Taking Active Multiple Informants  ?furosemide (LASIX) 40 MG tablet [id number]  Take 0.5 tablets (  20 mg total) by mouth daily. Deberah Pelton, NP  Expired 08/18/20 2359   ?levothyroxine (SYNTHROID, LEVOTHROID) 150 MCG tablet [id number] Yes Take 150 mcg by mouth daily before breakfast. [provider] Taking Active Multiple Informants  ?         ?Med Note Duffy Bruce, Legrand Como   Fri Jun 17, 2020  1:58 PM)    ?lisinopril (ZESTRIL) 10 MG tablet [id number] Yes TAKE 1 TABLET BY MOUTH EVERY DAY Croitoru, Mihai, MD Taking Active   ?Magnesium Oxide 400 (240 MG) MG TABS  [id number] Yes TAKE 1 TABLET TWICE DAILY.  ?Patient taking differently: Take 400 mg by mouth daily.  ? Croitoru, Dani Gobble, MD Taking Active   ?         ?Med Note Duffy Bruce, Legrand Como   Fri Jun 17, 2020  4:06 PM) Morning and Afternoon  ?metFORMIN (GLUCOPHAGE) 500 MG tablet [id number] Yes Take 1,000 mg by mouth 2 (two) times daily. [provider] Taking Active Multiple Informants  ?         ?Med Note Duffy Bruce, Legrand Como   Fri Jun 17, 2020  4:08 PM) Morning and Afternoon  ?  Discontinued 02/25/15 1541   ?protein supplement shake (PREMIER PROTEIN) LIQD [id number] Yes Take 237 mLs by mouth in the morning. [provider] Taking Active Multiple Informants  ?solifenacin (VESICARE) 10 MG tablet [id number] Yes Take 10 mg by mouth at bedtime. [provider] Taking Active Multiple Informants  ?traMADol (ULTRAM) 50 MG tablet [id number] Yes Take 50 mg by mouth 3 (three) times daily as needed for moderate pain. [provider] Taking Active Multiple Informants  ?VOLTAREN 1 % GEL [id number] Yes Apply 2 g topically daily as needed (for bilateral knee pain). [provider] Taking Active Multiple Informants  ?         ?Med Note Anette Riedel Oct 05, 2013 12:31 PM)    ? ?  ?  ? ?  ? ? ? ?SDOH:  (Social Determinants of Health) assessments and interventions performed:  ? ? ? ?Care Plan ? ?Review of patient past medical history, allergies, medications, health status, including review of consultants reports, laboratory and other test data, was performed as part of comprehensive evaluation for care management services.  ? ?Care Plan : RN Care Manager Plan of Care  ?Updates made by Tobi Bastos, RN since 07/04/2021 12:00 AM  ?  ? ?Problem: Knowledge Deficit related to CHF Management and care coordination needs   ?Priority: High  ?  ? ?Long-Range Goal: Development of a Plan of Care for management of CHF   ?Start Date: 04/28/2021  ?Expected End Date: 10/09/2021  ?This Visit's Progress: On track  ?Recent  Progress: On track  ?Priority: High  ?Note:   ?Current Barriers:  ?Knowledge Deficits related to plan of care for management of CHF  ? ?RNCM Clinical Goal(s):  ?Patient will verbalize basic understanding of  CHF disease process and self health management plan as evidenced by Self reporting ?take all medications exactly as prescribed and will call provider for medication related questions as evidenced by self report, chart review and  through collaboration with RN Care manager, provider, and care team.  ? ?Interventions: ?Inter-disciplinary care team collaboration (see longitudinal plan of care) ?Evaluation of current treatment plan related to  self management and patient's adherence to plan as established by provider ? ? ?Heart Failure Interventions:  (Status:  Goal on track:  Yes.) Long Term Goal ?Basic overview and  discussion of pathophysiology of Heart Failure reviewed ?Provided education on low sodium diet ?Reviewed Heart Failure Action Plan in depth and provided written copy ?Assessed need for readable accurate scales in home ?Provided education about placing scale on hard, flat surface ?Advised patient to weigh each morning after emptying bladder ?Discussed importance of daily weight and advised patient to weigh and record daily ?Reviewed role of diuretics in prevention of fluid overload and management of heart failure; ?Discussed the importance of keeping all appointments with provider ?Provided patient with education about the role of exercise in the management of heart failure ?Screening for signs and symptoms of depression related to chronic disease state  ? ?Update 3/15- Spoke with pt today who reports she is doing well remaining in the GREEN zone with a weight of 121 lbs with no reported symptoms or issues. Plan of care discussed  and encouraged pt to continue to be adherent. No other needs encountered to address at this time. ? ?Update 07/04/2021-Spoke with pt today who verified she is doing well with  reported weights of 120-121 lbs with no acute symptoms or swelling. Pt was very short with the conversation today but provided information indicating she would be moving soon and was grateful for the call as it e

## 2021-07-09 ENCOUNTER — Emergency Department (HOSPITAL_COMMUNITY): Payer: Medicare HMO

## 2021-07-09 ENCOUNTER — Inpatient Hospital Stay (HOSPITAL_COMMUNITY)
Admission: EM | Admit: 2021-07-09 | Discharge: 2021-07-13 | DRG: 280 | Disposition: A | Discharge status: Home Health | Payer: Medicare HMO | Attending: Internal Medicine | Admitting: Internal Medicine

## 2021-07-09 ENCOUNTER — Encounter (HOSPITAL_COMMUNITY): Payer: Self-pay

## 2021-07-09 DIAGNOSIS — R0600 Dyspnea, unspecified: Secondary | ICD-10-CM | POA: Diagnosis not present

## 2021-07-09 DIAGNOSIS — I255 Ischemic cardiomyopathy: Secondary | ICD-10-CM | POA: Diagnosis not present

## 2021-07-09 DIAGNOSIS — Z7984 Long term (current) use of oral hypoglycemic drugs: Secondary | ICD-10-CM

## 2021-07-09 DIAGNOSIS — M8580 Other specified disorders of bone density and structure, unspecified site: Secondary | ICD-10-CM | POA: Diagnosis present

## 2021-07-09 DIAGNOSIS — I10 Essential (primary) hypertension: Secondary | ICD-10-CM | POA: Diagnosis present

## 2021-07-09 DIAGNOSIS — I420 Dilated cardiomyopathy: Secondary | ICD-10-CM | POA: Diagnosis not present

## 2021-07-09 DIAGNOSIS — I5043 Acute on chronic combined systolic (congestive) and diastolic (congestive) heart failure: Secondary | ICD-10-CM | POA: Diagnosis not present

## 2021-07-09 DIAGNOSIS — Z8673 Personal history of transient ischemic attack (TIA), and cerebral infarction without residual deficits: Secondary | ICD-10-CM

## 2021-07-09 DIAGNOSIS — R0902 Hypoxemia: Secondary | ICD-10-CM | POA: Diagnosis not present

## 2021-07-09 DIAGNOSIS — I5031 Acute diastolic (congestive) heart failure: Secondary | ICD-10-CM | POA: Diagnosis not present

## 2021-07-09 DIAGNOSIS — I2510 Atherosclerotic heart disease of native coronary artery without angina pectoris: Secondary | ICD-10-CM | POA: Diagnosis not present

## 2021-07-09 DIAGNOSIS — Z8249 Family history of ischemic heart disease and other diseases of the circulatory system: Secondary | ICD-10-CM | POA: Diagnosis not present

## 2021-07-09 DIAGNOSIS — Z888 Allergy status to other drugs, medicaments and biological substances status: Secondary | ICD-10-CM | POA: Diagnosis not present

## 2021-07-09 DIAGNOSIS — Z951 Presence of aortocoronary bypass graft: Secondary | ICD-10-CM | POA: Diagnosis not present

## 2021-07-09 DIAGNOSIS — Z853 Personal history of malignant neoplasm of breast: Secondary | ICD-10-CM

## 2021-07-09 DIAGNOSIS — I4510 Unspecified right bundle-branch block: Secondary | ICD-10-CM | POA: Diagnosis not present

## 2021-07-09 DIAGNOSIS — E039 Hypothyroidism, unspecified: Secondary | ICD-10-CM | POA: Diagnosis not present

## 2021-07-09 DIAGNOSIS — R7989 Other specified abnormal findings of blood chemistry: Secondary | ICD-10-CM | POA: Diagnosis present

## 2021-07-09 DIAGNOSIS — I110 Hypertensive heart disease with heart failure: Secondary | ICD-10-CM | POA: Diagnosis not present

## 2021-07-09 DIAGNOSIS — I272 Pulmonary hypertension, unspecified: Secondary | ICD-10-CM | POA: Diagnosis not present

## 2021-07-09 DIAGNOSIS — Z9981 Dependence on supplemental oxygen: Secondary | ICD-10-CM | POA: Diagnosis not present

## 2021-07-09 DIAGNOSIS — I21A1 Myocardial infarction type 2: Secondary | ICD-10-CM | POA: Diagnosis present

## 2021-07-09 DIAGNOSIS — I5023 Acute on chronic systolic (congestive) heart failure: Secondary | ICD-10-CM | POA: Diagnosis present

## 2021-07-09 DIAGNOSIS — E1159 Type 2 diabetes mellitus with other circulatory complications: Secondary | ICD-10-CM

## 2021-07-09 DIAGNOSIS — I480 Paroxysmal atrial fibrillation: Secondary | ICD-10-CM | POA: Diagnosis present

## 2021-07-09 DIAGNOSIS — I509 Heart failure, unspecified: Secondary | ICD-10-CM | POA: Diagnosis not present

## 2021-07-09 DIAGNOSIS — K219 Gastro-esophageal reflux disease without esophagitis: Secondary | ICD-10-CM | POA: Diagnosis not present

## 2021-07-09 DIAGNOSIS — Z79890 Hormone replacement therapy (postmenopausal): Secondary | ICD-10-CM

## 2021-07-09 DIAGNOSIS — I4891 Unspecified atrial fibrillation: Secondary | ICD-10-CM | POA: Diagnosis not present

## 2021-07-09 DIAGNOSIS — J90 Pleural effusion, not elsewhere classified: Secondary | ICD-10-CM | POA: Diagnosis not present

## 2021-07-09 DIAGNOSIS — Z743 Need for continuous supervision: Secondary | ICD-10-CM | POA: Diagnosis not present

## 2021-07-09 DIAGNOSIS — Z79899 Other long term (current) drug therapy: Secondary | ICD-10-CM | POA: Diagnosis not present

## 2021-07-09 DIAGNOSIS — J811 Chronic pulmonary edema: Secondary | ICD-10-CM | POA: Diagnosis not present

## 2021-07-09 DIAGNOSIS — Z7982 Long term (current) use of aspirin: Secondary | ICD-10-CM

## 2021-07-09 DIAGNOSIS — J80 Acute respiratory distress syndrome: Secondary | ICD-10-CM | POA: Diagnosis not present

## 2021-07-09 DIAGNOSIS — I493 Ventricular premature depolarization: Secondary | ICD-10-CM | POA: Diagnosis present

## 2021-07-09 DIAGNOSIS — R54 Age-related physical debility: Secondary | ICD-10-CM | POA: Diagnosis present

## 2021-07-09 DIAGNOSIS — E119 Type 2 diabetes mellitus without complications: Secondary | ICD-10-CM | POA: Diagnosis not present

## 2021-07-09 DIAGNOSIS — J9621 Acute and chronic respiratory failure with hypoxia: Secondary | ICD-10-CM | POA: Diagnosis not present

## 2021-07-09 DIAGNOSIS — E7800 Pure hypercholesterolemia, unspecified: Secondary | ICD-10-CM | POA: Diagnosis present

## 2021-07-09 DIAGNOSIS — R778 Other specified abnormalities of plasma proteins: Secondary | ICD-10-CM | POA: Diagnosis present

## 2021-07-09 DIAGNOSIS — Z20822 Contact with and (suspected) exposure to covid-19: Secondary | ICD-10-CM | POA: Diagnosis not present

## 2021-07-09 DIAGNOSIS — Z87891 Personal history of nicotine dependence: Secondary | ICD-10-CM

## 2021-07-09 LAB — URINALYSIS, COMPLETE (UACMP) WITH MICROSCOPIC
Bilirubin Urine: NEGATIVE
Glucose, UA: NEGATIVE mg/dL
Hgb urine dipstick: NEGATIVE
Ketones, ur: NEGATIVE mg/dL
Leukocytes,Ua: NEGATIVE
Nitrite: NEGATIVE
Protein, ur: NEGATIVE mg/dL
Specific Gravity, Urine: 1.01 (ref 1.005–1.030)
pH: 6 (ref 5.0–8.0)

## 2021-07-09 LAB — COMPREHENSIVE METABOLIC PANEL
ALT: 38 U/L (ref 0–44)
AST: 35 U/L (ref 15–41)
Albumin: 3.8 g/dL (ref 3.5–5.0)
Alkaline Phosphatase: 34 U/L — ABNORMAL LOW (ref 38–126)
Anion gap: 11 (ref 5–15)
BUN: 40 mg/dL — ABNORMAL HIGH (ref 8–23)
CO2: 25 mmol/L (ref 22–32)
Calcium: 10.5 mg/dL — ABNORMAL HIGH (ref 8.9–10.3)
Chloride: 99 mmol/L (ref 98–111)
Creatinine, Ser: 0.89 mg/dL (ref 0.44–1.00)
GFR, Estimated: >60 mL/min (ref >60)
Glucose, Bld: 149 mg/dL — ABNORMAL HIGH (ref 70–99)
Potassium: 4.6 mmol/L (ref 3.5–5.1)
Sodium: 135 mmol/L (ref 135–145)
Total Bilirubin: 0.7 mg/dL (ref 0.3–1.2)
Total Protein: 6.3 g/dL — ABNORMAL LOW (ref 6.5–8.1)

## 2021-07-09 LAB — CBC WITH DIFFERENTIAL/PLATELET
Abs Immature Granulocytes: 0.04 10*3/uL (ref 0.00–0.07)
Basophils Absolute: 0.1 10*3/uL (ref 0.0–0.1)
Basophils Relative: 1 %
Eosinophils Absolute: 0.5 10*3/uL (ref 0.0–0.5)
Eosinophils Relative: 6 %
HCT: 47.3 % — ABNORMAL HIGH (ref 36.0–46.0)
Hemoglobin: 15.8 g/dL — ABNORMAL HIGH (ref 12.0–15.0)
Immature Granulocytes: 0 %
Lymphocytes Relative: 10 %
Lymphs Abs: 0.9 10*3/uL (ref 0.7–4.0)
MCH: 35.3 pg — ABNORMAL HIGH (ref 26.0–34.0)
MCHC: 33.4 g/dL (ref 30.0–36.0)
MCV: 105.8 fL — ABNORMAL HIGH (ref 80.0–100.0)
Monocytes Absolute: 0.7 10*3/uL (ref 0.1–1.0)
Monocytes Relative: 8 %
Neutro Abs: 6.8 10*3/uL (ref 1.7–7.7)
Neutrophils Relative %: 75 %
Platelets: 166 10*3/uL (ref 150–400)
RBC: 4.47 MIL/uL (ref 3.87–5.11)
RDW: 13.3 % (ref 11.5–15.5)
WBC: 9 10*3/uL (ref 4.0–10.5)
nRBC: 0 % (ref 0.0–0.2)

## 2021-07-09 LAB — TROPONIN I (HIGH SENSITIVITY)
Troponin I (High Sensitivity): 35 ng/L — ABNORMAL HIGH (ref <18)
Troponin I (High Sensitivity): 35 ng/L — ABNORMAL HIGH (ref <18)

## 2021-07-09 LAB — BRAIN NATRIURETIC PEPTIDE: B Natriuretic Peptide: 836.4 pg/mL — ABNORMAL HIGH (ref 0.0–100.0)

## 2021-07-09 LAB — CBG MONITORING, ED: Glucose-Capillary: 181 mg/dL — ABNORMAL HIGH (ref 70–99)

## 2021-07-09 MED ORDER — FUROSEMIDE 10 MG/ML IJ SOLN
40.0000 mg | Freq: Once | INTRAMUSCULAR | Status: AC
  Administered 2021-07-09: 40 mg via INTRAVENOUS
  Filled 2021-07-09: qty 4

## 2021-07-09 MED ORDER — INSULIN ASPART 100 UNIT/ML IJ SOLN
0.0000 [IU] | Freq: Three times a day (TID) | INTRAMUSCULAR | Status: DC
  Administered 2021-07-10: 2 [IU] via SUBCUTANEOUS
  Administered 2021-07-10: 3 [IU] via SUBCUTANEOUS
  Administered 2021-07-10: 2 [IU] via SUBCUTANEOUS
  Administered 2021-07-11: 7 [IU] via SUBCUTANEOUS
  Administered 2021-07-11: 2 [IU] via SUBCUTANEOUS
  Administered 2021-07-11: 3 [IU] via SUBCUTANEOUS
  Administered 2021-07-12: 5 [IU] via SUBCUTANEOUS
  Administered 2021-07-12 – 2021-07-13 (×3): 2 [IU] via SUBCUTANEOUS
  Administered 2021-07-13: 5 [IU] via SUBCUTANEOUS

## 2021-07-09 MED ORDER — INSULIN ASPART 100 UNIT/ML IJ SOLN
0.0000 [IU] | Freq: Every day | INTRAMUSCULAR | Status: DC
  Administered 2021-07-10: 2 [IU] via SUBCUTANEOUS
  Administered 2021-07-11 – 2021-07-12 (×2): 3 [IU] via SUBCUTANEOUS

## 2021-07-09 NOTE — H&P (Signed)
? ? Marissa Jefferson TDD:[id number] DOB: October 18, 1924 DOA: 07/09/2021 ? ? ?  ?PCP: Merrilee Seashore, MD   ?Outpatient Specialists:  ?CARDS:  Dr. Sallyanne Kuster ?  ? ?Patient arrived to ER on 07/09/21 at 1715 ?Referred by Attending Kommor, Debe Coder, MD ? ? ?Patient coming from:   ? home Lives alone  ?  ? ?Chief Complaint:   ?Chief Complaint  ?Patient presents with  ? Congestive Heart Failure  ? ? ?HPI: ?Marissa Jefferson is a 88 y.o. female with medical history significant of systolic CHF EF 62-37, diabetes, history of breast cancer, CAD, history of CVA, HLD, HTN, hypothyroidism, history of NSVT, osteopenia ?  ? ?Presented with   shortness of breath hypoxemia ?Patient coming from home by EMS has known history of CHF today developed hypoxia with oxygen saturation went down to 80s while at home.  Called the provider who recommended the patient be admitted for IV Lasix when EMS arrived her oxygen saturation was 82% on 3 L and they bumped her up to 5 L at which point improved to 94% ?Known ischemic dilated cardiomyopathy with EF of 20-25% she has been having significant swelling her legs she is on 2 L at baseline ?No associated chest pain abdominal pain no nausea no vomiting no diarrhea ? Family states she was actually down to 50% at home and supposed to be on 2-3 L o2 ? Few weeks ago she appeared dry based on the labs and was told to decreased her lasix to every other day but since then her O2 requirement went up  ?She has been more confused lately ?  ?Regarding pertinent Chronic problems:   ? ? Hyperlipidemia -  on statins Lipitor ?Lipid Panel  ?   ?Component Value Date/Time  ? CHOL 135 05/10/2014 1440  ? TRIG 138 05/10/2014 1440  ? HDL 47 05/10/2014 1440  ? CHOLHDL 2.9 05/10/2014 1440  ? VLDL 28 05/10/2014 1440  ? [place] 60 05/10/2014 1440  ? ? ? HTN on Norvasc, Coreg, lisinopril ? ? chronic CHF diastolic/systolic/ combined - last echo Apr 2022 ? ?EF now improved Left ventricular ejection fraction, by estimation, is 55 to 60%. The  ?left  ventricle has normal function. The left ventricle has no regional  ?wall motion abnormalities. There is moderate left ventricular hypertrophy.  ?Left ventricular diastolic  ?parameters are consistent with Grade II diastolic dysfunction  ?(pseudonormalization). ?On Lasix ? ?  CAD  - On Aspirin, statin, betablocker,   ?               -  followed by cardiology ?               - last cardiac cath 2015 ?native coronary artery 10/06/2013   ?  LAD 90%, CFX 90%, RCA 100% w/ collat  ?  ? ?  DM 2 -  ?Lab Results  ?Component Value Date  ? HGBA1C 8.2 (H) 06/17/2020  ? on  PO meds only,  ? ? Hypothyroidism:  ?Lab Results  ?Component Value Date  ? TSH 2.175 06/17/2020  ? on synthroid ?    ?  Hx of CVA 2000 -  with/out residual deficits on Aspirin 81 mg,   ?  ? ?While in ER: ?  ? ?Came in with hypoxia was found to have CHF ?Given a dose of Lasix ?   ? ?CXR - Mild CHF. ?2. Enlarged pulmonary arteries compatible with PA hypertension. ?    ? ?Following Medications were ordered in ER: ?Medications  ?furosemide (  LASIX) injection 40 mg (40 mg Intravenous Given 07/09/21 1929)  ?   ?  ?ED Triage Vitals  ?Enc Vitals Group  ?   BP 07/09/21 1728 120/71  ?   Pulse Rate 07/09/21 1728 (!) 59  ?   Resp 07/09/21 1728 18  ?   Temp 07/09/21 1728 98.1 ?F (36.7 ?C)  ?   Temp Source 07/09/21 1728 Oral  ?   SpO2 07/09/21 1728 92 %  ?   Weight 07/09/21 1729 121 lb (54.9 kg)  ?   Height 07/09/21 1729 '5\' 6"'$  (1.676 m)  ?   Head Circumference --   ?   Peak Flow --   ?   Pain Score 07/09/21 1723 0  ?   Pain Loc --   ?   Pain Edu? --   ?   Excl. in [place]? --   ?NIDP(82)@    ? _________________________________________ ?Significant initial  Findings: ?Abnormal Labs Reviewed  ?COMPREHENSIVE METABOLIC PANEL - Abnormal; Notable for the following components:  ?    Result Value  ? Glucose, Bld 149 (*)   ? BUN 40 (*)   ? Calcium 10.5 (*)   ? Total Protein 6.3 (*)   ? Alkaline Phosphatase 34 (*)   ? All other components within normal limits  ?CBC WITH  DIFFERENTIAL/PLATELET - Abnormal; Notable for the following components:  ? Hemoglobin 15.8 (*)   ? HCT 47.3 (*)   ? MCV 105.8 (*)   ? MCH 35.3 (*)   ? All other components within normal limits  ?BRAIN NATRIURETIC PEPTIDE - Abnormal; Notable for the following components:  ? B Natriuretic Peptide 836.4 (*)   ? All other components within normal limits  ?TROPONIN I (HIGH SENSITIVITY) - Abnormal; Notable for the following components:  ? Troponin I (High Sensitivity) 35 (*)   ? All other components within normal limits  ?  ?_________________________ ?Troponin 35  ?ECG: Ordered ?Personally reviewed by me showing: ?HR : 57 ?Rhythm: : Sinus rhythm Multiform ventricular premature complexes Right bundle branch block ?QTC 455 ?  ?____________________ ?This patient meets SIRS Criteria and may be septic. ?   ?The recent clinical data is shown below. ?Vitals:  ? 07/09/21 1728 07/09/21 1729 07/09/21 1745 07/09/21 1845  ?BP: 120/71  130/71 133/73  ?Pulse: (!) 59  (!) 53 (!) 54  ?Resp: '18  13 14  '$ ?Temp: 98.1 ?F (36.7 ?C)     ?TempSrc: Oral     ?SpO2: 92%  91% 93%  ?Weight:  54.9 kg    ?Height:  '5\' 6"'$  (1.676 m)    ?  ?WBC ? ?   ?Component Value Date/Time  ? WBC 9.0 07/09/2021 1727  ? LYMPHSABS 0.9 07/09/2021 1727  ? LYMPHSABS 1.8 10/28/2007 0919  ? MONOABS 0.7 07/09/2021 1727  ? MONOABS 0.6 10/28/2007 0919  ? EOSABS 0.5 07/09/2021 1727  ? EOSABS 0.4 10/28/2007 0919  ? BASOSABS 0.1 07/09/2021 1727  ? BASOSABS 0.0 10/28/2007 0919  ?   ? ? UA  ordered ?    ?_______________________________________________ ?Hospitalist was called for admission for CHF exacerbation  ? ? ?The following Work up has been ordered so far: ? ?Orders Placed This Encounter  ?Procedures  ? Critical Care  ? DG Chest 2 View  ? Comprehensive metabolic panel  ? CBC with Differential  ? Brain natriuretic peptide  ? Consult to hospitalist  ? EKG 12-Lead  ?  ? ?OTHER Significant initial  Findings: ? ?labs showing: ? ?  ?  Recent Labs  ?Lab 07/09/21 ?1727  ?NA 135  ?K 4.6   ?CO2 25  ?GLUCOSE 149*  ?BUN 40*  ?CREATININE 0.89  ?CALCIUM 10.5*  ? ? ?Cr   stable,    ?Lab Results  ?Component Value Date  ? CREATININE 0.89 07/09/2021  ? CREATININE 0.58 01/05/2021  ? CREATININE 0.86 07/19/2020  ? ? ?Recent Labs  ?Lab 07/09/21 ?1727  ?AST 35  ?ALT 38  ?ALKPHOS 34*  ?BILITOT 0.7  ?PROT 6.3*  ?ALBUMIN 3.8  ? ?Lab Results  ?Component Value Date  ? CALCIUM 10.5 (H) 07/09/2021  ? PHOS 4.7 (H) 03/24/2014  ? ?     ?Plt: ?Lab Results  ?Component Value Date  ? PLT 166 07/09/2021  ?  ?COVID-19 Labs ? ?No results for input(s): DDIMER, FERRITIN, LDH, CRP in the last 72 hours. ? ?Lab Results  ?Component Value Date  ? [place] NEGATIVE 06/17/2020  ?  ? ?   ?Recent Labs  ?Lab 07/09/21 ?1727  ?WBC 9.0  ?NEUTROABS 6.8  ?HGB 15.8*  ?HCT 47.3*  ?MCV 105.8*  ?PLT 166  ? ? ?HG/HCT stable,    ?   ?Component Value Date/Time  ? HGB 15.8 (H) 07/09/2021 1727  ? HGB 14.6 10/28/2007 0919  ? HCT 47.3 (H) 07/09/2021 1727  ? HCT 42.5 10/28/2007 0919  ? MCV 105.8 (H) 07/09/2021 1727  ? MCV 93.1 10/28/2007 0919  ?   ? ?Cardiac Panel (last 3 results) ?No results for input(s): CKTOTAL, CKMB, TROPONINI, RELINDX in the last 72 hours. ? .car ?BNP (last 3 results) ?Recent Labs  ?  07/09/21 ?9373  ?BNP 836.4*  ? ?  ? ?DM  labs:  ?HbA1C: ?No results for input(s): HGBA1C in the last 8760 hours. ?  ?  ?CBG (last 3)  ?Recent Labs  ?  07/09/21 ?2229  ?GLUCAP 181*  ? ?  ? ?    ?Cultures: ?   ?Component Value Date/Time  ? SDES URINE, CATHETERIZED 03/21/2014 1324  ? [place] NONE 03/21/2014 1324  ? CULT  03/21/2014 1324  ?  ESCHERICHIA COLI ?Performed at [facility] ?  ? REPTSTATUS 03/24/2014 FINAL 03/21/2014 1324  ? ?  ?Radiological Exams on Admission: ?DG Chest 2 View ? ?Result Date: 07/09/2021 ?CLINICAL DATA:  Dyspnea.  CHF. EXAM: CHEST - 2 VIEW COMPARISON:  06/18/2020 FINDINGS: Previous median sternotomy and CABG procedure. Mild cardiac enlargement. Enlargement of the pulmonary arteries identified compatible with PA  hypertension. Small bilateral pleural effusions are identified right greater than left. There is mild diffuse interstitial edema. No airspace opacities. Signs of left axillary nodal dissection identified. IMPRESSION: 1. Mild CHF

## 2021-07-09 NOTE — Assessment & Plan Note (Signed)
this patient has acute respiratory failure with Hypoxia  as documented by the presence of following: ?O2 saturatio< 90% on RA Likely due to CHF exacerbation,  ?Provide O2 therapy and titrate as needed ? Continuous pulse ox ?  check Pulse ox with ambulation prior to discharge ?  ?   ?

## 2021-07-09 NOTE — Assessment & Plan Note (Signed)
Chronic stable continue Lipitor ?

## 2021-07-09 NOTE — Assessment & Plan Note (Signed)
-   Pt diagnosed with CHF based on presence of the following:  OA, rales on exam, cardiomegaly, Pulmonary edema on CXR, and  bilateral leg edema, With noted response to IV diuretic in ER ? ?admit on telemetry,  ?cycle cardiac enzymes, Troponin 35 ?  ?obtain serial ECG  to evaluate for ischemia as a cause of heart failure ? monitor daily weight:  ?Danley Danker Weights  ? 07/09/21 1729  ?Weight: 54.9 kg  ? ?Last BNP BNP (last 3 results) ?Recent Labs  ?  07/09/21 ?3383  ?BNP 836.4*  ? ? ?  ? diurese with IV lasix and monitor orthostatics and creatinine to avoid over diuresis. ? Order echogram to evaluate EF and valves ?  ?  cardiology consulted  ? ?

## 2021-07-09 NOTE — Assessment & Plan Note (Signed)
-   Check TSH continue home medications at current dose ° °

## 2021-07-09 NOTE — Assessment & Plan Note (Signed)
Chronic stable continue aspirin and statin and beta-blocker ?

## 2021-07-09 NOTE — Assessment & Plan Note (Signed)
Chronic continue Norvasc and Coreg hold lisinopril for tonight given slight bump in creatinine ?

## 2021-07-09 NOTE — ED Notes (Signed)
Patient transported to X-ray 

## 2021-07-09 NOTE — ED Provider Notes (Signed)
?[facility] ?Provider Note ? ?CSN: [id number] ?Arrival date & time: 07/09/21 1715 ? ?Chief Complaint(s) ?Congestive Heart Failure ? ?HPI ?Marissa Jefferson is a 88 y.o. female with PMH CHF, CVA, HTN, HLD, ischemic dilated cardiomyopathy with an EF of 2025% who presents to the emergency department for evaluation of fluid overload.  Patient states that she has been swelling in her legs and was found to be hypoxic into the 50s last night.  Patient is on 2 L nasal cannula and found to be 81% on home O2 by EMS.  She arrives on 5 L nasal cannula saturating 92%.  Denies chest pain, abdominal pain, nausea, vomiting or other systemic symptoms. ? ? ?Past Medical History ?Past Medical History:  ?Diagnosis Date  ? Arthritis   ? "knees" (10/08/2013)  ? Breast cancer ([place])   ? "left; I had 62 sessions of radiation"  ? CAD (coronary artery disease), native coronary artery 10/06/2013  ? LAD 90%, CFX 90%, RCA 100% w/ collat  ? Chronic combined systolic and diastolic CHF, NYHA class 2 ([place]) 09/2013  ? CVA (cerebral vascular accident) [facility] 1968  ? leaving no deficit  ? DM2 (diabetes mellitus, type 2) ([place])   ? GERD (gastroesophageal reflux disease)   ? Hyperlipidemia   ? Hypertension   ? Hypothyroidism   ? Ischemic dilated cardiomyopathy ([place]) 09/2013  ? EF 20-25% by echo  ? NSVT (nonsustained ventricular tachycardia) ([place]) 09/2013  ? OAB (overactive bladder)   ? Osteopenia   ? 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 ?Patient Active Problem List  ? Diagnosis Date Noted  ? Pressure injury of skin 06/18/2020  ? Acute respiratory failure with hypoxia ([place]) 06/18/2020  ? Acute CHF (congestive heart failure) ([place]) 06/17/2020  ? CHF (congestive heart failure) ([place]) 06/17/2020  ? Multiple pulmonary nodules 02/26/2015  ? Pleural effusion, bilateral  R >> L  02/25/2015  ? Chronic diastolic heart failure ([place]) 01/03/2015  ? Right ventricular outflow tract premature  ventricular contractions (PVCs) 05/10/2014  ? Elevated troponin   ? Postoperative atrial fibrillation ([facility])   ? Pulmonary hypertension ([place])   ? Hypokalemia   ? Other specified hypothyroidism   ? Malnutrition of moderate degree ([place]) 03/24/2014  ? Hypothyroidism 10/22/2013  ? Overactive bladder 10/22/2013  ? GERD (gastroesophageal reflux disease) 10/22/2013  ? S/P CABG x 4 10/09/2013  ? Coronary artery disease involving native coronary artery of native heart without angina pectoris 10/06/2013  ? Angina pectoris ([place]) 10/05/2013  ? Essential hypertension 10/05/2013  ? Hypercholesterolemia 10/05/2013  ? Diabetes mellitus type 2, controlled ([place]) 10/05/2013  ? NSVT (nonsustained ventricular tachycardia) ([place]) 09/09/2013  ? ?Home Medication(s) ?Prior to Admission medications   ?Medication Sig Start Date End Date Taking? Authorizing Provider  ?acetaminophen (TYLENOL) 500 MG tablet Take 1,000 mg by mouth See admin instructions. Take 1,000 mg by mouth in the morning & at bedtime and an additional 500 mg once a day as needed for pain/discomfort    [provider]  ?amLODipine (NORVASC) 10 MG tablet TAKE 1 TABLET BY MOUTH EVERY DAY 02/13/21   Croitoru, Mihai, MD  ?aspirin EC 81 MG tablet Take 1 tablet (81 mg total) by mouth daily. 01/23/19   Croitoru, Mihai, MD  ?atorvastatin (LIPITOR) 80 MG tablet Take 80 mg by mouth daily. 10/04/13   [provider]  ?B Complex-Biotin-FA (SUPER B-50 COMPLEX PO) Take 1 tablet by mouth daily before lunch.    [provider]  ?Biotin  5000 MCG SUBL Place 5,000 mcg under the tongue daily.    [provider]  ?carvedilol (COREG) 25 MG tablet TAKE 1 TABLET BY MOUTH TWICE A DAY WITH MEALS 05/15/21   Croitoru, Mihai, MD  ?Cholecalciferol (VITAMIN D3) 125 MCG (5000 UT) CAPS Take 5,000 Units by mouth daily before lunch.    [provider]  ?Cyanocobalamin (VITAMIN B-12) 2500 MCG SUBL Place 2,500 mcg under the tongue daily before lunch.    [provider]  ?denosumab (PROLIA) 60 MG/ML SOSY injection Inject 60 mg into the skin every 6 (six) months.    [provider]  ?docusate sodium (COLACE) 100 MG capsule Take 100 mg by mouth See admin instructions. Take 100 mg by mouth at bedtime and HOLD FOR DIARRHEA    [provider]  ?furosemide (LASIX) 40 MG tablet Take 0.5 tablets (20 mg total) by mouth daily. 07/19/20 08/18/20  Deberah Pelton, NP  ?levothyroxine (SYNTHROID, LEVOTHROID) 150 MCG tablet Take 150 mcg by mouth daily before breakfast. 12/06/14   [provider]  ?lisinopril (ZESTRIL) 10 MG tablet TAKE 1 TABLET BY MOUTH EVERY DAY 10/12/20   Croitoru, Mihai, MD  ?Magnesium Oxide 400 (240 MG) MG TABS TAKE 1 TABLET TWICE DAILY. ?Patient taking differently: Take 400 mg by mouth daily. 12/22/13   Croitoru, Mihai, MD  ?metFORMIN (GLUCOPHAGE) 500 MG tablet Take 1,000 mg by mouth 2 (two) times daily.    [provider]  ?potassium chloride (KLOR-CON) 10 MEQ tablet Take 10 mEq by mouth daily.    [provider]  ?protein supplement shake (PREMIER PROTEIN) LIQD Take 237 mLs by mouth in the morning.    [provider]  ?solifenacin (VESICARE) 10 MG tablet Take 10 mg by mouth at bedtime.    [provider]  ?traMADol (ULTRAM) 50 MG tablet Take 50 mg by mouth 3 (three) times daily as needed for moderate pain. 05/24/20   [provider]  ?VOLTAREN 1 % GEL Apply 2 g topically daily as needed (for bilateral knee pain). ?Patient not taking: Reported on 11/18/2020 09/02/13   [provider]  ?potassium chloride (K-DUR) 10 MEQ tablet Take 0.5 tablets (5 mEq total) by mouth daily. 09/28/14 02/25/15  Croitoru, Dani Gobble, MD  ?                                                                                                                                  ?Past Surgical History ?Past Surgical History:  ?Procedure Laterality Date  ? BREAST LUMPECTOMY Left 1998  ? BREAST LUMPECTOMY WITH AXILLARY LYMPH NODE DISSECTION  Left 1998  ? CARDIAC CATHETERIZATION  10/06/2013  ? CATARACT EXTRACTION W/ INTRAOCULAR LENS  IMPLANT, BILATERAL Bilateral ~ 2009  ? COLONOSCOPY  2005  ? CORONARY ARTERY BYPASS GRAFT [name] 10/09/2013  ? Procedure: CORONARY ARTERY BYPASS GRAFTING (CABG) x 4 using left internal mammary artery and right saphenous leg vein using endoscope.;  Surgeon:  Melrose Nakayama, MD;  Location: [facility];  Service: Open Heart Surgery;  Laterality: [name];  ? INTRAOPERATIVE TRANSESOPHAGEAL ECHOCARDIOGRAM [name] 10/09/2013  ? Procedure: INTRAOPERATIVE TRANSESOPHAGEAL ECHOCARDIOGRAM;  Surgeon: Melrose Nakayama, MD;  Location: [facility];  Service: Open Heart Surgery;  Laterality: [name];  ? LEFT AND RIGHT HEART CATHETERIZATION WITH CORONARY ANGIOGRAM [name] 10/06/2013  ? Procedure: LEFT AND RIGHT HEART CATHETERIZATION WITH CORONARY ANGIOGRAM;  Surgeon: Sanda Klein, MD;  Location: [facility];  Service: Cardiovascular;  Laterality: [name];  ? TONSILLECTOMY AND ADENOIDECTOMY  1930's  ? ?Family History ?Family History  ?Problem Relation Age of Onset  ? Colon cancer Mother   ? Cancer Father   ?     ? type  ? Heart attack Father   ? Asthma Son   ? Asthma Daughter   ? ? ?Social History ?Social History  ? ?Tobacco Use  ? Smoking status: Former  ?  Packs/day: 0.25  ?  Years: 3.00  ?  Pack years: 0.75  ?  Types: Cigarettes  ?  Quit date: 03/12/1948  ?  Years since quitting: 73.3  ? Smokeless tobacco: Never  ? Tobacco comments:  ?  smoked in college   ?Substance Use Topics  ? Alcohol use: No  ?  Alcohol/week: 0.0 standard drinks  ? Drug use: No  ? ?Allergies ?Amiodarone, Crestor [rosuvastatin], and Sitagliptin ? ?Review of Systems ?Review of Systems  ?Respiratory:  Positive for shortness of breath.   ?Cardiovascular:  Positive for leg swelling.  ? ?Physical Exam ?Vital Signs  ?I have reviewed the triage vital signs ?BP 120/71 (BP Location: Left Arm)   Pulse (!) 59   Temp 98.1 ?F (36.7 ?C) (Oral)   Resp 18   Ht '5\' 6"'$  (1.676 m)   Wt 54.9 kg   SpO2 92%   BMI  19.53 kg/m?  ? ?Physical Exam ?Vitals and nursing note reviewed.  ?Constitutional:   ?   General: She is not in acute distress. ?   Appearance: She is well-developed.  ?HENT:  ?   Head: Normocephalic and atraumat

## 2021-07-09 NOTE — Assessment & Plan Note (Signed)
Elevated but stable continue to monitor on telemetry.  Echo in a.m. ?

## 2021-07-09 NOTE — Assessment & Plan Note (Signed)
Continue PPI ?

## 2021-07-09 NOTE — Subjective & Objective (Signed)
Patient coming from home by EMS has known history of CHF today developed hypoxia with oxygen saturation went down to 80s while at home.  Called the provider who recommended the patient be admitted for IV Lasix when EMS arrived her oxygen saturation was 82% on 3 L and they bumped her up to 5 L at which point improved to 94% ?Known ischemic dilated cardiomyopathy with EF of 20-25% she has been having significant swelling her legs she is on 2 L at baseline ?No associated chest pain abdominal pain no nausea no vomiting no diarrhea ?

## 2021-07-09 NOTE — Assessment & Plan Note (Signed)
Order sliding scale hold p.o. medications 

## 2021-07-09 NOTE — ED Triage Notes (Signed)
Pt coming from home via [facility]. Pt has CHF. Pt O2 dropped into the 80s. Pts DR recommended she come in for IV lasix. Per EMS pt 82% on 3L and 94 on 5 L.  ?

## 2021-07-10 ENCOUNTER — Inpatient Hospital Stay (HOSPITAL_COMMUNITY): Payer: Medicare HMO

## 2021-07-10 ENCOUNTER — Other Ambulatory Visit: Payer: Self-pay

## 2021-07-10 DIAGNOSIS — I2510 Atherosclerotic heart disease of native coronary artery without angina pectoris: Secondary | ICD-10-CM | POA: Diagnosis not present

## 2021-07-10 DIAGNOSIS — R778 Other specified abnormalities of plasma proteins: Secondary | ICD-10-CM | POA: Diagnosis not present

## 2021-07-10 DIAGNOSIS — I5043 Acute on chronic combined systolic (congestive) and diastolic (congestive) heart failure: Secondary | ICD-10-CM | POA: Diagnosis not present

## 2021-07-10 DIAGNOSIS — I5031 Acute diastolic (congestive) heart failure: Secondary | ICD-10-CM

## 2021-07-10 LAB — ECHOCARDIOGRAM COMPLETE
AR max vel: 2.33 cm2
AV Peak grad: 12.1 mmHg
Ao pk vel: 1.74 m/s
Area-P 1/2: 2.3 cm2
Height: 66 in
S' Lateral: 1.9 cm
Weight: 1936 oz

## 2021-07-10 LAB — CBC
HCT: 47.8 % — ABNORMAL HIGH (ref 36.0–46.0)
Hemoglobin: 16 g/dL — ABNORMAL HIGH (ref 12.0–15.0)
MCH: 35.1 pg — ABNORMAL HIGH (ref 26.0–34.0)
MCHC: 33.5 g/dL (ref 30.0–36.0)
MCV: 104.8 fL — ABNORMAL HIGH (ref 80.0–100.0)
Platelets: 160 10*3/uL (ref 150–400)
RBC: 4.56 MIL/uL (ref 3.87–5.11)
RDW: 13.3 % (ref 11.5–15.5)
WBC: 8.5 10*3/uL (ref 4.0–10.5)
nRBC: 0 % (ref 0.0–0.2)

## 2021-07-10 LAB — RESP PANEL BY RT-PCR (FLU A&B, COVID) ARPGX2
Influenza A by PCR: NEGATIVE
Influenza B by PCR: NEGATIVE
SARS Coronavirus 2 by RT PCR: NEGATIVE

## 2021-07-10 LAB — MAGNESIUM: Magnesium: 1.8 mg/dL (ref 1.7–2.4)

## 2021-07-10 LAB — COMPREHENSIVE METABOLIC PANEL
ALT: 33 U/L (ref 0–44)
AST: 25 U/L (ref 15–41)
Albumin: 3.4 g/dL — ABNORMAL LOW (ref 3.5–5.0)
Alkaline Phosphatase: 31 U/L — ABNORMAL LOW (ref 38–126)
Anion gap: 9 (ref 5–15)
BUN: 33 mg/dL — ABNORMAL HIGH (ref 8–23)
CO2: 30 mmol/L (ref 22–32)
Calcium: 10 mg/dL (ref 8.9–10.3)
Chloride: 97 mmol/L — ABNORMAL LOW (ref 98–111)
Creatinine, Ser: 0.84 mg/dL (ref 0.44–1.00)
GFR, Estimated: >60 mL/min (ref >60)
Glucose, Bld: 150 mg/dL — ABNORMAL HIGH (ref 70–99)
Potassium: 3.7 mmol/L (ref 3.5–5.1)
Sodium: 136 mmol/L (ref 135–145)
Total Bilirubin: 0.9 mg/dL (ref 0.3–1.2)
Total Protein: 6 g/dL — ABNORMAL LOW (ref 6.5–8.1)

## 2021-07-10 LAB — HEMOGLOBIN A1C
Hgb A1c MFr Bld: 7.3 % — ABNORMAL HIGH (ref 4.8–5.6)
Mean Plasma Glucose: 162.81 mg/dL

## 2021-07-10 LAB — CK: Total CK: 42 U/L (ref 38–234)

## 2021-07-10 LAB — TROPONIN I (HIGH SENSITIVITY): Troponin I (High Sensitivity): 38 ng/L — ABNORMAL HIGH (ref <18)

## 2021-07-10 LAB — PREALBUMIN: Prealbumin: 19.1 mg/dL (ref 18–38)

## 2021-07-10 LAB — TSH: TSH: 3.4 u[IU]/mL (ref 0.350–4.500)

## 2021-07-10 LAB — CBG MONITORING, ED
Glucose-Capillary: 165 mg/dL — ABNORMAL HIGH (ref 70–99)
Glucose-Capillary: 156 mg/dL — ABNORMAL HIGH (ref 70–99)

## 2021-07-10 LAB — GLUCOSE, CAPILLARY
Glucose-Capillary: 210 mg/dL — ABNORMAL HIGH (ref 70–99)
Glucose-Capillary: 218 mg/dL — ABNORMAL HIGH (ref 70–99)

## 2021-07-10 LAB — MRSA NEXT GEN BY PCR, NASAL: MRSA by PCR Next Gen: NOT DETECTED

## 2021-07-10 LAB — OSMOLALITY: Osmolality: 305 mOsm/kg — ABNORMAL HIGH (ref 275–295)

## 2021-07-10 LAB — PHOSPHORUS: Phosphorus: 3.2 mg/dL (ref 2.5–4.6)

## 2021-07-10 MED ORDER — ACETAMINOPHEN 325 MG PO TABS
650.0000 mg | ORAL_TABLET | Freq: Four times a day (QID) | ORAL | Status: DC | PRN
  Administered 2021-07-11 (×2): 650 mg via ORAL
  Filled 2021-07-10 (×2): qty 2

## 2021-07-10 MED ORDER — ACETAMINOPHEN 650 MG RE SUPP: 650.0000 mg | Freq: Four times a day (QID) | RECTAL | Status: DC | PRN

## 2021-07-10 MED ORDER — ATORVASTATIN CALCIUM 80 MG PO TABS
80.0000 mg | ORAL_TABLET | Freq: Every day | ORAL | Status: DC
  Administered 2021-07-10 – 2021-07-13 (×4): 80 mg via ORAL
  Filled 2021-07-10 (×4): qty 1

## 2021-07-10 MED ORDER — SODIUM CHLORIDE 0.9 % IV SOLN: 250.0000 mL | INTRAVENOUS | Status: DC | PRN

## 2021-07-10 MED ORDER — ASPIRIN EC 81 MG PO TBEC
81.0000 mg | DELAYED_RELEASE_TABLET | Freq: Every day | ORAL | Status: DC
  Administered 2021-07-10 – 2021-07-13 (×4): 81 mg via ORAL
  Filled 2021-07-10 (×4): qty 1

## 2021-07-10 MED ORDER — DICLOFENAC SODIUM 1 % EX GEL: 2.0000 g | Freq: Every day | CUTANEOUS | Status: DC | PRN

## 2021-07-10 MED ORDER — POLYETHYLENE GLYCOL 3350 17 G PO PACK: 17.0000 g | PACK | Freq: Every day | ORAL | Status: DC | PRN

## 2021-07-10 MED ORDER — SODIUM CHLORIDE 0.9% FLUSH: 3.0000 mL | INTRAVENOUS | Status: DC | PRN

## 2021-07-10 MED ORDER — FUROSEMIDE 10 MG/ML IJ SOLN
40.0000 mg | Freq: Every day | INTRAMUSCULAR | Status: DC
  Administered 2021-07-10 – 2021-07-11 (×2): 40 mg via INTRAVENOUS
  Filled 2021-07-10 (×2): qty 4

## 2021-07-10 MED ORDER — LEVOTHYROXINE SODIUM 75 MCG PO TABS
150.0000 ug | ORAL_TABLET | Freq: Every day | ORAL | Status: DC
  Administered 2021-07-10 – 2021-07-13 (×4): 150 ug via ORAL
  Filled 2021-07-10 (×4): qty 2

## 2021-07-10 MED ORDER — HYDROCODONE-ACETAMINOPHEN 5-325 MG PO TABS: 1.0000 | ORAL_TABLET | ORAL | Status: DC | PRN

## 2021-07-10 MED ORDER — BISACODYL 10 MG RE SUPP: 10.0000 mg | Freq: Every day | RECTAL | Status: DC | PRN

## 2021-07-10 MED ORDER — CARVEDILOL 25 MG PO TABS
25.0000 mg | ORAL_TABLET | Freq: Two times a day (BID) | ORAL | Status: DC
Start: 2021-07-10 — End: 2021-07-13
  Administered 2021-07-10 – 2021-07-13 (×7): 25 mg via ORAL
  Filled 2021-07-10: qty 1
  Filled 2021-07-10: qty 8
  Filled 2021-07-10 (×5): qty 1

## 2021-07-10 MED ORDER — SENNA 8.6 MG PO TABS
1.0000 | ORAL_TABLET | Freq: Two times a day (BID) | ORAL | Status: DC
  Administered 2021-07-10 – 2021-07-13 (×6): 8.6 mg via ORAL
  Filled 2021-07-10 (×6): qty 1

## 2021-07-10 MED ORDER — DOCUSATE SODIUM 100 MG PO CAPS
100.0000 mg | ORAL_CAPSULE | Freq: Two times a day (BID) | ORAL | Status: DC
  Administered 2021-07-10 – 2021-07-13 (×5): 100 mg via ORAL
  Filled 2021-07-10 (×5): qty 1

## 2021-07-10 MED ORDER — GERHARDT'S BUTT CREAM
TOPICAL_CREAM | Freq: Two times a day (BID) | CUTANEOUS | Status: DC
Start: 2021-07-10 — End: 2021-07-13
  Filled 2021-07-10: qty 1

## 2021-07-10 MED ORDER — AMLODIPINE BESYLATE 10 MG PO TABS
10.0000 mg | ORAL_TABLET | Freq: Every day | ORAL | Status: DC
  Administered 2021-07-10 – 2021-07-13 (×4): 10 mg via ORAL
  Filled 2021-07-10 (×2): qty 1
  Filled 2021-07-10: qty 2
  Filled 2021-07-10: qty 1

## 2021-07-10 MED ORDER — SODIUM CHLORIDE 0.9% FLUSH
3.0000 mL | Freq: Two times a day (BID) | INTRAVENOUS | Status: DC
  Administered 2021-07-10 – 2021-07-13 (×7): 3 mL via INTRAVENOUS

## 2021-07-10 MED ORDER — SACUBITRIL-VALSARTAN 24-26 MG PO TABS
1.0000 | ORAL_TABLET | Freq: Two times a day (BID) | ORAL | Status: DC
  Administered 2021-07-11 – 2021-07-13 (×5): 1 via ORAL
  Filled 2021-07-10 (×5): qty 1

## 2021-07-10 NOTE — Evaluation (Signed)
Physical Therapy Evaluation ?Patient Details ?Name: Marissa Jefferson ?MRN: [id number] ?DOB: 1924/08/06 ?Today's Date: 07/10/2021 ? ?History of Present Illness ? Pt is a 88 y/o female presenting on 4/30 with SOB.  Admitted with CHF. PMH includes: CHF with EF of 20-25%, DM2, breast CA, CAD, CVA, HTN, osteopenia.  ?Clinical Impression ? Pt admitted secondary to problem above with deficits below. Pt requiring min guard A for mobility tasks using BUE support this session. Uses rollator at baseline and recommend continuing use. Reports daughter can assist if needed at d/c. Feel pt would benefit from HHPT at d/c. Will continue to follow acutely.  ?   ? ?Recommendations for follow up therapy are one component of a multi-disciplinary discharge planning process, led by the attending physician.  Recommendations may be updated based on patient status, additional functional criteria and insurance authorization. ? ?Follow Up Recommendations Home health PT ? ?  ?Assistance Recommended at Discharge Frequent or constant Supervision/Assistance  ?Patient can return home with the following ? A little help with bathing/dressing/bathroom;Assistance with cooking/housework;Help with stairs or ramp for entrance;Assist for transportation;A little help with walking and/or transfers ? ?  ?Equipment Recommendations None recommended by PT  ?Recommendations for Other Services ?    ?  ?Functional Status Assessment Patient has had a recent decline in their functional status and demonstrates the ability to make significant improvements in function in a reasonable and predictable amount of time.  ? ?  ?Precautions / Restrictions Precautions ?Precautions: Fall ?Precaution Comments: watch O2, on 6L ?Restrictions ?Weight Bearing Restrictions: No  ? ?  ? ?Mobility ? Bed Mobility ?Overal bed mobility: Needs Assistance ?Bed Mobility: Supine to Sit, Sit to Supine ?  ?  ?Supine to sit: Min guard ?Sit to supine: Min guard ?  ?General bed mobility comments: for safety ?   ? ?Transfers ?Overall transfer level: Needs assistance ?Equipment used: 2 person hand held assist ?Transfers: Sit to/from Stand ?Sit to Stand: Min guard ?  ?  ?  ?  ?  ?General transfer comment: Min guard for safety. ?  ? ?Ambulation/Gait ?Ambulation/Gait assistance: Min guard, +2 safety/equipment ?Gait Distance (Feet): 5 Feet ?Assistive device: 2 person hand held assist ?Gait Pattern/deviations: Step-through pattern, Decreased stride length, Narrow base of support ?Gait velocity: Decreased ?  ?  ?General Gait Details: Min guard for safety to take steps to/from EOB. Mild unsteadiness noted. ? ?Stairs ?  ?  ?  ?  ?  ? ?Wheelchair Mobility ?  ? ?Modified Rankin (Stroke Patients Only) ?  ? ?  ? ?Balance Overall balance assessment: Needs assistance ?Sitting-balance support: No upper extremity supported, Feet supported ?Sitting balance-Leahy Scale: Fair ?  ?  ?Standing balance support: Bilateral upper extremity supported, During functional activity ?Standing balance-Leahy Scale: Poor ?Standing balance comment: relies on BUE support ?  ?  ?  ?  ?  ?  ?  ?  ?  ?  ?  ?   ? ? ? ?Pertinent Vitals/Pain Pain Assessment ?Pain Assessment: No/denies pain  ? ? ?Home Living Family/patient expects to be discharged to:: Private residence ?Living Arrangements: Alone ?Available Help at Discharge: Personal care attendant;Family ?Type of Home: Apartment ?Home Access: Stairs to enter ?Entrance Stairs-Rails: Right ?Entrance Stairs-Number of Steps: 20 ?  ?Home Layout: One level ?Home Equipment: Rollator (4 wheels);Rolling Walker (2 wheels) ?Additional Comments: Reports she plans to move in a couple of weeks, aide assists with IADLs 2x/week.  reports daughter can stay with her initally  ?  ?Prior Function Prior Level  of Function : Needs assist ?  ?  ?  ?  ?  ?  ?Mobility Comments: Reports someone is always with her when doing steps. Uses rollator for mobility. ?ADLs Comments: Reports independence with dressing and bathing. Aide assists with  cooking/cleaning ?  ? ? ?Hand Dominance  ?   ? ?  ?Extremity/Trunk Assessment  ? Upper Extremity Assessment ?Upper Extremity Assessment: Defer to OT evaluation ?  ? ?Lower Extremity Assessment ?Lower Extremity Assessment: Generalized weakness ?  ? ?Cervical / Trunk Assessment ?Cervical / Trunk Assessment: Normal  ?Communication  ? Communication: No difficulties  ?Cognition Arousal/Alertness: Awake/alert ?Behavior During Therapy: [facility] for tasks assessed/performed ?Overall Cognitive Status: Within Functional Limits for tasks assessed ?  ?  ?  ?  ?  ?  ?  ?  ?  ?  ?  ?  ?  ?  ?  ?  ?General Comments: appears WFL, requires redirection (verbose) ?  ?  ? ?  ?General Comments General comments (skin integrity, edema, etc.): on 6L [place] with VSS, reports typically wears 2-2.5L ? ?  ?Exercises    ? ?Assessment/Plan  ?  ?PT Assessment Patient needs continued PT services  ?PT Problem List Decreased strength;Decreased activity tolerance;Decreased balance;Decreased mobility;Decreased knowledge of use of DME;Decreased knowledge of precautions ? ?   ?  ?PT Treatment Interventions DME instruction;Stair training;Gait training;Functional mobility training;Therapeutic activities;Therapeutic exercise;Balance training;Patient/family education   ? ?PT Goals (Current goals can be found in the Care Plan section)  ?Acute Rehab PT Goals ?Patient Stated Goal: to feel better ?PT Goal Formulation: With patient ?Time For Goal Achievement: 07/24/21 ?Potential to Achieve Goals: Good ? ?  ?Frequency Min 3X/week ?  ? ? ?Co-evaluation PT/OT/SLP Co-Evaluation/Treatment: Yes ?Reason for Co-Treatment: For patient/therapist safety;To address functional/ADL transfers ?PT goals addressed during session: Mobility/safety with mobility;Balance ?OT goals addressed during session: ADL's and self-care ?  ? ? ?  ?AM-PAC PT "6 Clicks" Mobility  ?Outcome Measure Help needed turning from your back to your side while in a flat bed without using bedrails?: None ?Help  needed moving from lying on your back to sitting on the side of a flat bed without using bedrails?: A Little ?Help needed moving to and from a bed to a chair (including a wheelchair)?: A Little ?Help needed standing up from a chair using your arms (e.g., wheelchair or bedside chair)?: A Little ?Help needed to walk in hospital room?: A Little ?Help needed climbing 3-5 steps with a railing? : A Lot ?6 Click Score: 18 ? ?  ?End of Session Equipment Utilized During Treatment: Gait belt;Oxygen ?Activity Tolerance: Patient tolerated treatment well ?Patient left: in bed;with call bell/phone within reach (on stretcher in ED) ?Nurse Communication: Mobility status ?PT Visit Diagnosis: Unsteadiness on feet (R26.81);Muscle weakness (generalized) (M62.81) ?  ? ?Time: 1610-9604 ?PT Time Calculation (min) (ACUTE ONLY): 19 min ? ? ?Charges:   PT Evaluation ?$PT Eval Moderate Complexity: 1 Mod ?  ?  ?   ? ? ?Reuel Derby, PT, DPT  ?Acute Rehabilitation Services  ?Pager: [phone] ?Office: [phone] ? ? ?[place] ?07/10/2021, 1:24 PM ?

## 2021-07-10 NOTE — ED Notes (Signed)
Breakfast Order Placed ?

## 2021-07-10 NOTE — Consult Note (Signed)
?Cardiology Consultation:  ? ?Patient ID: Marissa Jefferson ?MRN: [id number]; DOB: [birth year] ? ?Admit date: 07/09/2021 ?Date of Consult: 07/10/2021 ? ?PCP:  Merrilee Seashore, MD ?  ?[facility] ?Cardiologist:  Sanda Klein, MD   { ? ?Patient Profile:  ? ?Marissa Jefferson is a 88 y.o. female with a hx of CAD s/p CABG 2297, chronic diastolic heart failure, postoperative atrial fibrillation without subsequent recurrence, essential hypertension, type 2 diabetes mellitus, mixed hyperlipidemia and history of left breast cancer s/p surgery and radiation ?who is being seen 07/10/2021 for the evaluation of CHF at the request of Dr. Tawanna Solo. ? ?She initially presented in 2015 with severe LV systolic dysfunction with LVEF 20-25% and episodes of NSVT. She was found to have multivessel CAD and underwent 3v CABG. Post-op, her EF recovered to 55%. Had post op afib.  ? ?Last echo 06/18/20 showed LVEF of 55-60%, no wm abnormality, grade II DD. RVSP 63.2 mmHg.  ? ?History of Present Illness:  ? ?Marissa Jefferson is recently dealing with shortness of breath and lower extremity edema.  She may have gained few pounds from her baseline weight of 121-122 pounds.  Her furosemide was increased by PCP without improvement.  (However per H&P she was found dry and advised to decrease Lasix). Yesterday she was hypoxic on supplemental oxygen at home and EMS was called.  Upon EMS arrival she was found hypoxic at 82% on 2 L oxygen which improved to 94% on 5 L oxygen.  Brought to ER for further evaluation. ? ?BNP 836.  Chest x-ray with mild CHF and findings suggestive of pulmonary hypertension.  Patient was admitted for CHF exacerbation and started on IV Lasix 40 mg daily.  Patient reports improved breathing since admission.  No acute I&O or daily weight. ? ?At baseline, patient lives by herself and does ADLs.  Hard of hearing.  Denies chest pain, palpitation, syncope or melena.  Reports using low-sodium diet.  She does most of the cooking. ? ?Pending  reading of echo.  ? ? ?Past Medical History:  ?Diagnosis Date  ? Acute CHF (congestive heart failure) ([place]) 06/17/2020  ? Arthritis   ? "knees" (10/08/2013)  ? Breast cancer ([place])   ? "left; I had 62 sessions of radiation"  ? CAD (coronary artery disease), native coronary artery 10/06/2013  ? LAD 90%, CFX 90%, RCA 100% w/ collat  ? CHF (congestive heart failure) ([place]) 06/17/2020  ? Chronic combined systolic and diastolic CHF, NYHA class 2 ([place]) 09/2013  ? CVA (cerebral vascular accident) [facility]) 1968  ? leaving no deficit  ? DM2 (diabetes mellitus, type 2) ([place])   ? GERD (gastroesophageal reflux disease)   ? Hyperlipidemia   ? Hypertension   ? Hypothyroidism   ? Ischemic dilated cardiomyopathy ([place]) 09/2013  ? EF 20-25% by echo  ? NSVT (nonsustained ventricular tachycardia) ([place]) 09/2013  ? OAB (overactive bladder)   ? Osteopenia   ? 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 ? ?Past Surgical History:  ?Procedure Laterality Date  ? BREAST LUMPECTOMY Left 1998  ? BREAST LUMPECTOMY WITH AXILLARY LYMPH NODE DISSECTION Left 1998  ? CARDIAC CATHETERIZATION  10/06/2013  ? CATARACT EXTRACTION W/ INTRAOCULAR LENS  IMPLANT, BILATERAL Bilateral ~ 2009  ? COLONOSCOPY  2005  ? CORONARY ARTERY BYPASS GRAFT [name] 10/09/2013  ? Procedure: CORONARY ARTERY BYPASS GRAFTING (CABG) x 4 using left internal mammary artery and right saphenous leg vein using endoscope.;  Surgeon: Melrose Nakayama, MD;  Location: [facility];  Service: Open Heart Surgery;  Laterality: [name];  ? INTRAOPERATIVE TRANSESOPHAGEAL ECHOCARDIOGRAM [name] 10/09/2013  ? Procedure: INTRAOPERATIVE TRANSESOPHAGEAL ECHOCARDIOGRAM;  Surgeon: Melrose Nakayama, MD;  Location: [facility];  Service: Open Heart Surgery;  Laterality: [name];  ? LEFT AND RIGHT HEART CATHETERIZATION WITH CORONARY ANGIOGRAM [name] 10/06/2013  ? Procedure: LEFT AND RIGHT HEART CATHETERIZATION WITH CORONARY ANGIOGRAM;  Surgeon: Sanda Klein, MD;  Location: [facility] CATH [facility];  Service:  Cardiovascular;  Laterality: [name];  ? TONSILLECTOMY AND ADENOIDECTOMY  1930's  ?  ?Inpatient Medications: ?Scheduled Meds: ? amLODipine  10 mg Oral Daily  ? aspirin EC  81 mg Oral Daily  ? atorvastatin  80 mg Oral Daily  ? carvedilol  25 mg Oral BID WC  ? docusate sodium  100 mg Oral BID  ? furosemide  40 mg Intravenous Daily  ? insulin aspart  0-5 Units Subcutaneous QHS  ? insulin aspart  0-9 Units Subcutaneous TID WC  ? levothyroxine  150 mcg Oral QAC breakfast  ? senna  1 tablet Oral BID  ? sodium chloride flush  3 mL Intravenous Q12H  ? ?Continuous Infusions: ? sodium chloride    ? ?PRN Meds: ?sodium chloride, acetaminophen **OR** acetaminophen, bisacodyl, diclofenac Sodium, HYDROcodone-acetaminophen, polyethylene glycol, sodium chloride flush ? ?Allergies:    ?Allergies  ?Allergen Reactions  ? Amiodarone Other (See Comments)  ?  Bradycardia  ? Crestor [Rosuvastatin] Nausea Only  ? Sitagliptin Other (See Comments)  ?  Caused urinary frequency  ? ? ?Social History:   ?Social History  ? ?Socioeconomic History  ? Marital status: Widowed  ?  Spouse name: Not on file  ? Number of children: Not on file  ? Years of education: Not on file  ? Highest education level: Not on file  ?Occupational History  ? Not on file  ?Tobacco Use  ? Smoking status: Former  ?  Packs/day: 0.25  ?  Years: 3.00  ?  Pack years: 0.75  ?  Types: Cigarettes  ?  Quit date: 03/12/1948  ?  Years since quitting: 73.3  ? Smokeless tobacco: Never  ? Tobacco comments:  ?  smoked in college   ?Substance and Sexual Activity  ? Alcohol use: No  ?  Alcohol/week: 0.0 standard drinks  ? Drug use: No  ? Sexual activity: Not Currently  ?Other Topics Concern  ? Not on file  ?Social History Narrative  ? Not on file  ? ?Social Determinants of Health  ? ?Financial Resource Strain: Not on file  ?Food Insecurity: Not on file  ?Transportation Needs: Not on file  ?Physical Activity: Not on file  ?Stress: Not on file  ?Social Connections: Not on file  ?Intimate Partner  Violence: Not on file  ?  ?Family History:   ?Family History  ?Problem Relation Age of Onset  ? Colon cancer Mother   ? Cancer Father   ?     ? type  ? Heart attack Father   ? Asthma Son   ? Asthma Daughter   ?  ? ?ROS:  ?Please see the history of present illness.  ?All other ROS reviewed and negative.    ? ?Physical Exam/Data:  ? ?Vitals:  ? 07/10/21 0739 07/10/21 1059 07/10/21 1140 07/10/21 1200  ?BP: (!) 157/88 108/70 115/71 (!) 141/84  ?Pulse: 61 (!) 52 (!) 57 (!) 56  ?Resp: '17 16 13 14  '$ ?Temp: 97.7 ?F (36.5 ?C) 98.8 ?F (37.1 ?C)    ?TempSrc: Oral Oral    ?SpO2: 91% 91% 92%  93%  ?Weight:      ?Height:      ? ? ?Intake/Output Summary (Last 24 hours) at 07/10/2021 1315 ?Last data filed at 07/10/2021 0000 ?Gross per 24 hour  ?Intake --  ?Output 1000 ml  ?Net -1000 ml  ? ? ?  07/09/2021  ?  5:29 PM 01/05/2021  ? 11:18 AM 11/18/2020  ?  1:10 PM  ?Last 3 Weights  ?Weight (lbs) 121 lb 125 lb 125 lb  ?Weight (kg) 54.885 kg 56.7 kg 56.7 kg  ?   ?Body mass index is 19.53 kg/m?.  ?General: Elderly frail female in no acute distress ?HEENT: normal ?Neck: no JVD ?Vascular: No carotid bruits; Distal pulses 2+ bilaterally ?Cardiac:  normal S1, S2; RRR; no murmur  ?Lungs:  clear to auscultation bilaterally, no wheezing, rhonchi or rales  ?Abd: soft, nontender, no hepatomegaly  ?Ext: no edema ?Musculoskeletal:  No deformities, BUE and BLE strength normal and equal ?Skin: warm and dry  ?Neuro:  CNs 2-12 intact, no focal abnormalities noted ?Psych:  Normal affect  ? ?EKG:  The EKG was personally reviewed and demonstrates: Sinus rhythm, right bundle branch block ?Telemetry:  Telemetry was personally reviewed and demonstrates: Sinus rhythm, PAC, PVC ? ?Relevant CV Studies: ? ?Echo 06/18/20 ? 1. Left ventricular ejection fraction, by estimation, is 55 to 60%. The  ?left ventricle has normal function. The left ventricle has no regional  ?wall motion abnormalities. There is moderate left ventricular hypertrophy.  ?Left ventricular diastolic   ?parameters are consistent with Grade II diastolic dysfunction  ?(pseudonormalization). Elevated left ventricular end-diastolic pressure.  ?There is the interventricular septum is flattened in diastole ('D' shap

## 2021-07-10 NOTE — Evaluation (Signed)
Occupational Therapy Evaluation ?Patient Details ?Name: Marissa Jefferson ?MRN: [id number] ?DOB: [birth year] ?Today's Date: 07/10/2021 ? ? ?History of Present Illness Pt is a 88 y/o female presenting on 4/30 with SOB.  Admitted with CHF. PMH includes: CHF with EF of 20-25%, DM2, breast CA, CAD, CVA, HTN, osteopenia.  ? ?Clinical Impression ?  ?PTA patient reports using rollator for mobility and ADLs with independence, some assist for IADLs from aide and always has assistance for navigating her steps. She reports typically wearing 2-2.5L of O2 at baseline, on 6L today with VSS.  Admitted for above and presents with problem list below, including generalized weakness, decreased activity tolerance, and impaired balance.  Patient currently requires min assist +2 safety for transfers and mobility (due to B hand held support, recommend using rollator as pt does at baseline), up to mod assist for LB ADLs and setup for seated UB ADLs.  She has aide assist 2x/week, but reports her daughter can stay with her initially at dc to provide additional support. Based on performance today, believe she will benefit from further OT services while admitted and after dc at [facility] level to optimize independence, safety and return to PLOF.  Will follow.  ?   ? ?Recommendations for follow up therapy are one component of a multi-disciplinary discharge planning process, led by the attending physician.  Recommendations may be updated based on patient status, additional functional criteria and insurance authorization.  ? ?Follow Up Recommendations ? Home health OT  ?  ?Assistance Recommended at Discharge Frequent or constant Supervision/Assistance  ?Patient can return home with the following A little help with walking and/or transfers;A little help with bathing/dressing/bathroom;Assistance with cooking/housework;Assist for transportation;Help with stairs or ramp for entrance ? ?  ?Functional Status Assessment ? Patient has had a recent decline in their  functional status and demonstrates the ability to make significant improvements in function in a reasonable and predictable amount of time.  ?Equipment Recommendations ? BSC/3in1  ?  ?Recommendations for Other Services   ? ? ?  ?Precautions / Restrictions Precautions ?Precautions: Fall ?Precaution Comments: watch O2, on 6L ?Restrictions ?Weight Bearing Restrictions: No  ? ?  ? ?Mobility Bed Mobility ?Overal bed mobility: Needs Assistance ?Bed Mobility: Supine to Sit, Sit to Supine ?  ?  ?Supine to sit: Min guard ?Sit to supine: Min guard ?  ?General bed mobility comments: for safety ?  ? ?Transfers ?  ?  ?  ?  ?  ?  ?  ?  ?  ?  ?  ? ?  ?Balance Overall balance assessment: Needs assistance ?Sitting-balance support: No upper extremity supported, Feet supported ?Sitting balance-Leahy Scale: Fair ?  ?  ?Standing balance support: Bilateral upper extremity supported, During functional activity ?Standing balance-Leahy Scale: Poor ?Standing balance comment: relies on BUE support ?  ?  ?  ?  ?  ?  ?  ?  ?  ?  ?  ?   ? ?ADL either performed or assessed with clinical judgement  ? ?ADL Overall ADL's : Needs assistance/impaired ?  ?  ?Grooming: Min guard;Standing ?  ?  ?  ?  ?  ?Upper Body Dressing : Set up;Sitting ?  ?Lower Body Dressing: Moderate assistance;Sit to/from stand ?Lower Body Dressing Details (indicate cue type and reason): requires assist to don socks, min assist in standing ?Toilet Transfer: Minimal assistance;+2 for safety/equipment;Ambulation ?Toilet Transfer Details (indicate cue type and reason): B hand held assist for mobility, typically uses rollator ?  ?  ?  ?  ?  Functional mobility during ADLs: Minimal assistance;+2 for safety/equipment ?General ADL Comments: pt limited by weakness, on 6L supplemental O2 via [place] with VSS (90-92%)  ? ? ? ?Vision   ?Vision Assessment?: No apparent visual deficits  ?   ?Perception   ?  ?Praxis   ?  ? ?Pertinent Vitals/Pain Pain Assessment ?Pain Assessment: No/denies pain   ? ? ? ?Hand Dominance   ?  ?Extremity/Trunk Assessment Upper Extremity Assessment ?Upper Extremity Assessment: Generalized weakness ?  ?Lower Extremity Assessment ?Lower Extremity Assessment: Defer to PT evaluation ?  ?  ?  ?Communication Communication ?Communication: No difficulties ?  ?Cognition Arousal/Alertness: Awake/alert ?Behavior During Therapy: [facility] for tasks assessed/performed ?Overall Cognitive Status: Within Functional Limits for tasks assessed ?  ?  ?  ?  ?  ?  ?  ?  ?  ?  ?  ?  ?  ?  ?  ?  ?General Comments: appears WFL, requires redirection (verbose) ?  ?  ?General Comments  on 6L [place] with VSS, reports typically wears 2-2.5L ? ?  ?Exercises   ?  ?Shoulder Instructions    ? ? ?Home Living Family/patient expects to be discharged to:: Private residence ?Living Arrangements: Alone ?Available Help at Discharge: Personal care attendant;Family ?Type of Home: Apartment ?Home Access: Stairs to enter ?Entrance Stairs-Number of Steps: 20 ?Entrance Stairs-Rails: Right ?Home Layout: One level ?  ?  ?Bathroom Shower/Tub: Sponge bathes at baseline;Tub/shower unit ?  ?Bathroom Toilet: Standard ?  ?  ?Home Equipment: Rollator (4 wheels);Rolling Walker (2 wheels) ?  ?Additional Comments: Reports she plans to move in a couple of weeks, aide assists with IADLs 2x/week.  reports daughter can stay with her initally ?  ? ?  ?Prior Functioning/Environment Prior Level of Function : Needs assist ?  ?  ?  ?  ?  ?  ?Mobility Comments: Reports someone is always with her when doing steps. Uses rollator for mobility. ?ADLs Comments: Reports independence with dressing and bathing. Aide assists with cooking/cleaning ?  ? ?  ?  ?OT Problem List: Decreased strength;Decreased activity tolerance;Impaired balance (sitting and/or standing);Decreased safety awareness;Decreased knowledge of use of DME or AE;Decreased knowledge of precautions ?  ?   ?OT Treatment/Interventions: Self-care/ADL training;Energy conservation;DME and/or AE  instruction;Therapeutic exercise;Therapeutic activities;Patient/family education;Balance training  ?  ?OT Goals(Current goals can be found in the care plan section) Acute Rehab OT Goals ?Patient Stated Goal: home ?OT Goal Formulation: With patient ?Time For Goal Achievement: 07/24/21 ?Potential to Achieve Goals: Good  ?OT Frequency: Min 2X/week ?  ? ?Co-evaluation PT/OT/SLP Co-Evaluation/Treatment: Yes ?Reason for Co-Treatment: For patient/therapist safety;To address functional/ADL transfers ?  ?OT goals addressed during session: ADL's and self-care ?  ? ?  ?AM-PAC OT "6 Clicks" Daily Activity     ?Outcome Measure Help from another person eating meals?: None ?Help from another person taking care of personal grooming?: A Little ?Help from another person toileting, which includes using toliet, bedpan, or urinal?: A Little ?Help from another person bathing (including washing, rinsing, drying)?: A Little ?Help from another person to put on and taking off regular upper body clothing?: A Little ?Help from another person to put on and taking off regular lower body clothing?: A Lot ?6 Click Score: 18 ?  ?End of Session Equipment Utilized During Treatment: Oxygen ?Nurse Communication: Mobility status ? ?Activity Tolerance: Patient tolerated treatment well ?Patient left: in bed;with call bell/phone within reach ? ?OT Visit Diagnosis: Other abnormalities of gait and mobility (R26.89);Muscle weakness (generalized) (M62.81)  ?              ?  Time: 6122-4497 ?OT Time Calculation (min): 18 min ?Charges:  OT General Charges ?$OT Visit: 1 Visit ?OT Evaluation ?$OT Eval Moderate Complexity: 1 Mod ? ?Jolaine Artist, OT ?Acute Rehabilitation Services ?Pager [phone] ?Office [phone] ? ? ?Delight Stare ?07/10/2021, 11:01 AM ?

## 2021-07-10 NOTE — Progress Notes (Signed)
PROGRESS NOTE  Ashantie Sewer  WUJ:[id number] DOB: 11-15-1924 DOA: 07/09/2021 PCP: Georgianne Fick, MD   Brief Narrative:  Patient is a 88 year old female with history of chronic congestive systolic heart failure with EF of 20 to 25%, diabetes type 2, history of breast cancer, coronary artery disease, CVA, hyperlipidemia, hypertension, hypothyroidism, osteopenia who presented with shortness of breath from home.  On presentation she was found to be hypoxic on room air and had to be put on 5 to 6 L of oxygen.  She also mentioned bilateral lower extremity edema.  She is on 2 L of oxygen at baseline.  Chest x-ray on condition showed features of congestive heart failure.  Admitted for management of acute systolic congestive heart failure.  Started on IV Lasix.  Cardiology consulted and following  Assessment & Plan:  Principal Problem:   Acute on chronic combined systolic and diastolic CHF (congestive heart failure) (HCC) Active Problems:   Diabetes mellitus type 2, controlled (HCC)   Acute on chronic respiratory failure with hypoxia (HCC)   Essential hypertension   Elevated troponin   Hypercholesterolemia   Coronary artery disease involving native coronary artery of native heart without angina pectoris   Hypothyroidism   GERD (gastroesophageal reflux disease)   Acute on chronic systolic congestive heart failure: Presented with shortness of breath, bilateral lower extremity edema.  Hypoxic in presentation.  Chest x-ray with features of congestive heart failure.  Elevated BNP.  Started on IV Lasix.  Continue to monitor input output, daily weight.  New echo has been ordered  Acute on chronic hypoxic respiratory failure: Secondary to CHF.  On 2 L of oxygen at home.  Hypoxic on her usual oxygen and had to be put on 6 to 7 L.  Continue to taper.  Elevated troponin: Most likely secondary to supply demand ischemia from CHF, type II MI.  Denies any chest pain.  Continue to monitor  Diabetes type 2:  Currently on sliding insulin scale.  Monitor blood sugars  Hypertension: On Norvasc, Coreg at home.  Also on  lisinopril at home, on hold   Hyperlipidemia: On Lipitor  History of coronary artery disease: Elevated troponin without any chest pain.  No anginal symptoms.  Continue aspirin, statin, beta-blocker  Hypothyroidism: Continue levothyroxine  GERD: On PPI  Debility/deconditioning: Uses walker, stays in apartment.  Will check PT/OT evaluation       DVT prophylaxis:SCDs Start: 07/10/21 0252     Code Status: Partial Code  Family Communication: None at bedside  Patient status:Inpatient  Patient is from :Home  Anticipated discharge NF:AOZH  Estimated DC date:2-3 days   Consultants: Cardiology  Procedures:None  Antimicrobials:  Anti-infectives (From admission, onward)    None       Subjective: Patient seen and examined at the bedside this morning.  On 5 L of oxygen per minute.  Was not in any kind of respite distress, speaking in full sentences and looked comfortable.  Has been having good diuresis.  No new complaints.  Objective: Vitals:   07/10/21 0600 07/10/21 0630 07/10/21 0700 07/10/21 0739  BP: 133/84 (!) 141/83 (!) 154/89 (!) 157/88  Pulse: 66 69 60 61  Resp: 17 18 (!) 8 17  Temp:    97.7 F (36.5 C)  TempSrc:    Oral  SpO2: 91% 93% 91% 91%  Weight:      Height:        Intake/Output Summary (Last 24 hours) at 07/10/2021 0819 Last data filed at 07/10/2021 0000 Gross per 24 hour  Intake --  Output 1000 ml  Net -1000 ml   Filed Weights   07/09/21 1729  Weight: 54.9 kg    Examination:  General exam: Overall comfortable, not in distress, elderly pleasant female HEENT: PERRL Respiratory system: Bilateral basilar crackles Cardiovascular system: S1 & S2 heard, RRR.  Gastrointestinal system: Abdomen is nondistended, soft and nontender. Central nervous system: Alert and oriented Extremities: Trace bilateral lower extremity edema, no clubbing  ,no cyanosis Skin: No rashes, no ulcers,no icterus     Data Reviewed: I have personally reviewed following labs and imaging studies  CBC: Recent Labs  Lab 07/09/21 1727 07/10/21 0421  WBC 9.0 8.5  NEUTROABS 6.8  --   HGB 15.8* 16.0*  HCT 47.3* 47.8*  MCV 105.8* 104.8*  PLT 166 160   Basic Metabolic Panel: Recent Labs  Lab 07/09/21 1727 07/10/21 0421  NA 135 136  K 4.6 3.7  CL 99 97*  CO2 25 30  GLUCOSE 149* 150*  BUN 40* 33*  CREATININE 0.89 0.84  CALCIUM 10.5* 10.0  MG  --  1.8  PHOS  --  3.2     Recent Results (from the past 240 hour(s))  Resp Panel by RT-PCR (Flu A&B, Covid) Nasopharyngeal Swab     Status: None   Collection Time: 07/09/21 11:29 PM   Specimen: Nasopharyngeal Swab; Nasopharyngeal(NP) swabs in vial transport medium  Result Value Ref Range Status   SARS Coronavirus 2 by RT PCR NEGATIVE NEGATIVE Final    Comment: (NOTE) SARS-CoV-2 target nucleic acids are NOT DETECTED.  The SARS-CoV-2 RNA is generally detectable in upper respiratory specimens during the acute phase of infection. The lowest concentration of SARS-CoV-2 viral copies this assay can detect is 138 copies/mL. A negative result does not preclude SARS-Cov-2 infection and should not be used as the sole basis for treatment or other patient management decisions. A negative result may occur with  improper specimen collection/handling, submission of specimen other than nasopharyngeal swab, presence of viral mutation(s) within the areas targeted by this assay, and inadequate number of viral copies(<138 copies/mL). A negative result must be combined with clinical observations, patient history, and epidemiological information. The expected result is Negative.  Fact Sheet for Patients:  BloggerCourse.com  Fact Sheet for Healthcare Providers:  SeriousBroker.it  This test is no t yet approved or cleared by the [place] FDA and  has been  authorized for detection and/or diagnosis of SARS-CoV-2 by FDA under an Emergency Use Authorization (EUA). This EUA will remain  in effect (meaning this test can be used) for the duration of the COVID-19 declaration under Section 564(b)(1) of the Act, 21 U.S.C.section 360bbb-3(b)(1), unless the authorization is terminated  or revoked sooner.       Influenza A by PCR NEGATIVE NEGATIVE Final   Influenza B by PCR NEGATIVE NEGATIVE Final    Comment: (NOTE) The Xpert Xpress SARS-CoV-2/FLU/RSV plus assay is intended as an aid in the diagnosis of influenza from Nasopharyngeal swab specimens and should not be used as a sole basis for treatment. Nasal washings and aspirates are unacceptable for Xpert Xpress SARS-CoV-2/FLU/RSV testing.  Fact Sheet for Patients: BloggerCourse.com  Fact Sheet for Healthcare Providers: SeriousBroker.it  This test is not yet approved or cleared by the [place] FDA and has been authorized for detection and/or diagnosis of SARS-CoV-2 by FDA under an Emergency Use Authorization (EUA). This EUA will remain in effect (meaning this test can be used) for the duration of the COVID-19 declaration under Section 564(b)(1) of the  Act, 21 U.S.C. section 360bbb-3(b)(1), unless the authorization is terminated or revoked.  Performed at [facility], [address], [place], [place] [address]      Radiology Studies: DG Chest 2 View  Result Date: 07/09/2021 CLINICAL DATA:  Dyspnea.  CHF. EXAM: CHEST - 2 VIEW COMPARISON:  06/18/2020 FINDINGS: Previous median sternotomy and CABG procedure. Mild cardiac enlargement. Enlargement of the pulmonary arteries identified compatible with PA hypertension. Small bilateral pleural effusions are identified right greater than left. There is mild diffuse interstitial edema. No airspace opacities. Signs of left axillary nodal dissection identified. IMPRESSION: 1. Mild CHF. 2.  Enlarged pulmonary arteries compatible with PA hypertension. Electronically Signed   By: Signa Kell M.D.   On: 07/09/2021 18:42    Scheduled Meds:  amLODipine  10 mg Oral Daily   aspirin EC  81 mg Oral Daily   atorvastatin  80 mg Oral Daily   carvedilol  25 mg Oral BID WC   docusate sodium  100 mg Oral BID   furosemide  40 mg Intravenous Daily   insulin aspart  0-5 Units Subcutaneous QHS   insulin aspart  0-9 Units Subcutaneous TID WC   levothyroxine  150 mcg Oral QAC breakfast   senna  1 tablet Oral BID   sodium chloride flush  3 mL Intravenous Q12H   Continuous Infusions:  sodium chloride       LOS: 1 day   Burnadette Pop, MD [facility] P5/03/2021, 8:19 AM

## 2021-07-10 NOTE — Progress Notes (Signed)
Pt remains at 87-88% on 6 L HFNC. Pt switched to NRB at 12L. Titrated down to 10L. Respiratory Therapy consulted on what to transition patient to next.  ?

## 2021-07-11 ENCOUNTER — Inpatient Hospital Stay (HOSPITAL_COMMUNITY): Payer: Medicare HMO

## 2021-07-11 ENCOUNTER — Encounter (HOSPITAL_COMMUNITY): Payer: Self-pay | Admitting: Internal Medicine

## 2021-07-11 DIAGNOSIS — I5043 Acute on chronic combined systolic (congestive) and diastolic (congestive) heart failure: Secondary | ICD-10-CM | POA: Diagnosis not present

## 2021-07-11 DIAGNOSIS — I2510 Atherosclerotic heart disease of native coronary artery without angina pectoris: Secondary | ICD-10-CM | POA: Diagnosis not present

## 2021-07-11 DIAGNOSIS — R778 Other specified abnormalities of plasma proteins: Secondary | ICD-10-CM | POA: Diagnosis not present

## 2021-07-11 LAB — BASIC METABOLIC PANEL
Anion gap: 8 (ref 5–15)
BUN: 31 mg/dL — ABNORMAL HIGH (ref 8–23)
CO2: 28 mmol/L (ref 22–32)
Calcium: 9.6 mg/dL (ref 8.9–10.3)
Chloride: 102 mmol/L (ref 98–111)
Creatinine, Ser: 0.86 mg/dL (ref 0.44–1.00)
GFR, Estimated: >60 mL/min (ref >60)
Glucose, Bld: 145 mg/dL — ABNORMAL HIGH (ref 70–99)
Potassium: 3.9 mmol/L (ref 3.5–5.1)
Sodium: 138 mmol/L (ref 135–145)

## 2021-07-11 LAB — GLUCOSE, CAPILLARY
Glucose-Capillary: 153 mg/dL — ABNORMAL HIGH (ref 70–99)
Glucose-Capillary: 235 mg/dL — ABNORMAL HIGH (ref 70–99)
Glucose-Capillary: 302 mg/dL — ABNORMAL HIGH (ref 70–99)
Glucose-Capillary: 251 mg/dL — ABNORMAL HIGH (ref 70–99)

## 2021-07-11 MED ORDER — GLUCERNA SHAKE PO LIQD
237.0000 mL | Freq: Three times a day (TID) | ORAL | Status: DC
  Administered 2021-07-11: 237 mL via ORAL
  Filled 2021-07-11 (×2): qty 237

## 2021-07-11 MED ORDER — VITAMIN B-12 1000 MCG PO TABS
5000.0000 ug | ORAL_TABLET | Freq: Every day | ORAL | Status: DC
  Administered 2021-07-11 – 2021-07-13 (×3): 5000 ug via ORAL
  Filled 2021-07-11 (×3): qty 5

## 2021-07-11 MED ORDER — FUROSEMIDE 10 MG/ML IJ SOLN
40.0000 mg | Freq: Two times a day (BID) | INTRAMUSCULAR | Status: DC
  Administered 2021-07-11 – 2021-07-12 (×2): 40 mg via INTRAVENOUS
  Filled 2021-07-11 (×2): qty 4

## 2021-07-11 MED ORDER — B COMPLEX-C PO TABS
1.0000 | ORAL_TABLET | Freq: Every day | ORAL | Status: DC
  Administered 2021-07-11 – 2021-07-13 (×3): 1 via ORAL
  Filled 2021-07-11 (×3): qty 1

## 2021-07-11 MED ORDER — SUPER B-50 COMPLEX PO CAPS: ORAL_CAPSULE | Freq: Every day | ORAL | Status: DC

## 2021-07-11 MED ORDER — VITAMIN D 25 MCG (1000 UNIT) PO TABS
5000.0000 [IU] | ORAL_TABLET | Freq: Every day | ORAL | Status: DC
  Administered 2021-07-11 – 2021-07-13 (×3): 5000 [IU] via ORAL
  Filled 2021-07-11 (×3): qty 5

## 2021-07-11 MED ORDER — BIOTIN 5000 MCG SL SUBL: 5000.0000 ug | SUBLINGUAL_TABLET | Freq: Every day | SUBLINGUAL | Status: DC

## 2021-07-11 NOTE — Progress Notes (Signed)
Heart Failure Nurse Navigator Progress Note  ? ? ?Patient declined offer to come to [facility] for a hospital follow up appointment. Educated on Heart failure sign and symptoms, daily weights, diet/fluid restrictions, take all medications as prescribed and keep all medical appointments. Patient voiced understanding of education.  ? ?Earnestine Leys, BSN, RN ?Heart Failure Nurse Navigator ?Secure Chat Only  ?

## 2021-07-11 NOTE — TOC Initial Note (Addendum)
Transition of Care (TOC) - Initial/Assessment Note  ? ? ?Patient Details  ?Name: Marissa Jefferson ?MRN: [id number] ?Date of Birth: 05-12-1924 ? ?Transition of Care (TOC) CM/SW Contact:    ?Angelita Ingles, RN ?Phone Number:[phone] ? ?07/11/2021, 11:48 AM ? ?Clinical Narrative:                 ?TOC consulted for patient with Home health needs. CM at bedside. Patient states that she is from home where she lives alone and has functioned independently. Patient states that she has had home health in the past but has no preference for home health agency for this admission. Home health referral has been submitted to Amy with [facility]. Acceptance pending. ? ?[facility] unable to accept referral. New referral sent to Angie with [facility]. Acceptance pending.  ? ?[place] referral has been accepted by Angie with [facility]. AVS updated.  ? ?  ?Barriers to Discharge: Continued Medical Work up ? ? ?Patient Goals and CMS Choice ?  ?  ?  ? ?Expected Discharge Plan and Services ?  ?  ?  ?  ?  ?                ?  ?  ?  ?  ?  ?  ?  ?  ?  ?  ? ?Prior Living Arrangements/Services ?  ?  ?  ?       ?  ?  ?  ?  ? ?Activities of Daily Living ?Home Assistive Devices/Equipment: Walker (specify type) (Rollator) ?  ? ?Permission Sought/Granted ?  ?  ?   ?   ?   ?   ? ?Emotional Assessment ?  ?  ?  ?  ?  ?  ? ?Admission diagnosis:  Acute on chronic combined systolic and diastolic CHF (congestive heart failure) ([place]) [I50.43] ?Heart failure, unspecified HF chronicity, unspecified heart failure type ([place]) [I50.9] ?Patient Active Problem List  ? Diagnosis Date Noted  ? Acute on chronic combined systolic and diastolic CHF (congestive heart failure) ([place]) 07/09/2021  ? Pressure injury of skin 06/18/2020  ? Acute on chronic respiratory failure with hypoxia ([place]) 06/18/2020  ? Multiple pulmonary nodules 02/26/2015  ? Pleural effusion, bilateral  R >> L  02/25/2015  ? Chronic diastolic heart failure ([place]) 01/03/2015  ? Right ventricular  outflow tract premature ventricular contractions (PVCs) 05/10/2014  ? Elevated troponin   ? Postoperative atrial fibrillation (HCC)   ? Pulmonary hypertension ([place])   ? Hypokalemia   ? Other specified hypothyroidism   ? Malnutrition of moderate degree ([place]) 03/24/2014  ? Hypothyroidism 10/22/2013  ? Overactive bladder 10/22/2013  ? GERD (gastroesophageal reflux disease) 10/22/2013  ? S/P CABG x 4 10/09/2013  ? Coronary artery disease involving native coronary artery of native heart without angina pectoris 10/06/2013  ? Angina pectoris ([place]) 10/05/2013  ? Essential hypertension 10/05/2013  ? Hypercholesterolemia 10/05/2013  ? Diabetes mellitus type 2, controlled ([place]) 10/05/2013  ? NSVT (nonsustained ventricular tachycardia) ([place]) 09/09/2013  ? ?PCP:  Merrilee Seashore, MD ?Pharmacy:   ?[facility] [place] - [address] ?[address] ?[place] [address] ?Phone: [phone] Fax: [phone] ? ?[name] ?[address] ?[place] [address] ?Phone: [phone] Fax: [phone] ? ? ? ? ?Social Determinants of Health (SDOH) Interventions ?  ? ?Readmission Risk Interventions ?   ? View : No data to display.  ?  ?  ?  ? ? ? ?

## 2021-07-11 NOTE — Progress Notes (Addendum)
Inpatient Diabetes Program Recommendations ? ?AACE/ADA: New Consensus Statement on Inpatient Glycemic Control  ? ?Target Ranges:  Prepandial:   less than 140 mg/dL ?     Peak postprandial:   less than 180 mg/dL (1-2 hours) ?     Critically ill patients:  140 - 180 mg/dL  ? ? Latest Reference Range & Units 07/10/21 07:35 07/10/21 12:00 07/10/21 15:50 07/10/21 21:42 07/11/21 07:07  ?Glucose-Capillary 70 - 99 mg/dL 165 (H) 156 (H) 210 (H) 218 (H) 153 (H)  ? ? Latest Reference Range & Units 06/17/20 19:05 07/10/21 04:21  ?Hemoglobin A1C 4.8 - 5.6 % 8.2 (H) 7.3 (H)  ? ?Review of Glycemic Control ? ?Diabetes history: DM2 ?Outpatient Diabetes medications: Metformin 1000 mg BID ?Current orders for Inpatient glycemic control: Novolog 0-9 units TID with meals, Novolog 0-5 units QHS ? ?Inpatient Diabetes Program Recommendations:   ? ?Insulin: If post prandial glucose is consistently over 180 mg/dl, may want to consider ordering Novolog 2 units TID with meals for meal coverage if patient eats at least 50% of meals. ? ?HbgA1C:  A1C 7.3% on 07/10/21 indicating an average glucose of 163 mg/dl over the past 2-3 months. ? ?NOTE: Noted consult for diabetes coordinator for DM2, A1C 7.3% Patient admitted with CHF. Chart reviewed. Current A1C of 7.3% is within goal for patient especially given her age (88 years old). Will sign off consult. Re-consult if needed. ? ?Thanks, ?Barnie Alderman, RN, MSN, CDE ?Diabetes Coordinator ?Inpatient Diabetes Program ?[phone] (Team Pager from 8am to 5pm) ? ?

## 2021-07-11 NOTE — Progress Notes (Signed)
? ?Progress Note ? ?Patient Name: Marissa Jefferson ?Date of Encounter: 07/11/2021 ? ?[facility] Cardiologist: Sanda Klein, MD  ? ?Subjective  ? ?Feeling OK.  Breathing improving.  ? ?Inpatient Medications  ?  ?Scheduled Meds: ? amLODipine  10 mg Oral Daily  ? aspirin EC  81 mg Oral Daily  ? atorvastatin  80 mg Oral Daily  ? carvedilol  25 mg Oral BID WC  ? docusate sodium  100 mg Oral BID  ? furosemide  40 mg Intravenous Daily  ? Gerhardt's butt cream   Topical BID  ? insulin aspart  0-5 Units Subcutaneous QHS  ? insulin aspart  0-9 Units Subcutaneous TID WC  ? levothyroxine  150 mcg Oral QAC breakfast  ? sacubitril-valsartan  1 tablet Oral BID  ? senna  1 tablet Oral BID  ? sodium chloride flush  3 mL Intravenous Q12H  ? ?Continuous Infusions: ? sodium chloride    ? ?PRN Meds: ?sodium chloride, acetaminophen **OR** acetaminophen, bisacodyl, diclofenac Sodium, HYDROcodone-acetaminophen, polyethylene glycol, sodium chloride flush  ? ?Vital Signs  ?  ?Vitals:  ? 07/11/21 0012 07/11/21 0257 07/11/21 0400 07/11/21 0856  ?BP: 107/60   (!) 143/85  ?Pulse: 60  69 69  ?Resp: 14   18  ?Temp: 97.6 ?F (36.4 ?C)  97.6 ?F (36.4 ?C) 97.7 ?F (36.5 ?C)  ?TempSrc: Axillary  Axillary Oral  ?SpO2: 97%   93%  ?Weight:  58.7 kg    ?Height:      ? ? ?Intake/Output Summary (Last 24 hours) at 07/11/2021 0930 ?Last data filed at 07/11/2021 0559 ?Gross per 24 hour  ?Intake 0 ml  ?Output 800 ml  ?Net -800 ml  ? ? ?  07/11/2021  ?  2:57 AM 07/10/2021  ?  3:30 PM 07/09/2021  ?  5:29 PM  ?Last 3 Weights  ?Weight (lbs) 129 lb 6.6 oz 123 lb 7.3 oz 121 lb  ?Weight (kg) 58.7 kg 56 kg 54.885 kg  ?   ? ?Telemetry  ?  ?Sinus bradycardia, sinus rhythm, PVCs - Personally Reviewed ? ?ECG  ?  ?[name] - Personally Reviewed ? ?Physical Exam  ? ?VS:  BP (!) 143/85   Pulse 69   Temp 97.7 ?F (36.5 ?C) (Oral)   Resp 18   Ht '5\' 7"'$  (1.702 m)   Wt 58.7 kg   SpO2 93%   BMI 20.27 kg/m?  , BMI Body mass index is 20.27 kg/m?. ?GENERAL:  Well appearing.  Frail ?HEENT: Pupils  equal round and reactive, fundi not visualized, oral mucosa unremarkable ?NECK:  No jugular venous distention, waveform within normal limits, carotid upstroke brisk and symmetric, no bruits, no thyromegaly ?LUNGS:  Mild basilar crackles ?HEART:  RRR.  PMI not displaced or sustained,S1 and S2 within normal limits, no S3, no S4, no clicks, no rubs, no murmurs ?ABD:  Flat, positive bowel sounds normal in frequency in pitch, no bruits, no rebound, no guarding, no midline pulsatile mass, no hepatomegaly, no splenomegaly ?EXT:  2 plus pulses throughout, 1+ LE edema, no cyanosis no clubbing ?SKIN:  No rashes no nodules ?NEURO:  Cranial nerves II through XII grossly intact, motor grossly intact throughout ?PSYCH:  Cognitively intact, oriented to person place and time ? ? ?Labs  ?  ?High Sensitivity Troponin:   ?Recent Labs  ?Lab 07/09/21 ?1727 07/09/21 ?1927 07/10/21 ?0421  ?TROPONINIHS 35* 35* 38*  ?   ?Chemistry ?Recent Labs  ?Lab 07/09/21 ?1727 07/10/21 ?0421 07/11/21 ?0947  ?NA 135 136 138  ?  K 4.6 3.7 3.9  ?CL 99 97* 102  ?CO2 '25 30 28  '$ ?GLUCOSE 149* 150* 145*  ?BUN 40* 33* 31*  ?CREATININE 0.89 0.84 0.86  ?CALCIUM 10.5* 10.0 9.6  ?MG  --  1.8  --   ?PROT 6.3* 6.0*  --   ?ALBUMIN 3.8 3.4*  --   ?AST 35 25  --   ?ALT 38 33  --   ?ALKPHOS 34* 31*  --   ?BILITOT 0.7 0.9  --   ?GFRNONAA >60 >60 >60  ?ANIONGAP '11 9 8  '$ ?  ?Lipids No results for input(s): CHOL, TRIG, HDL, LABVLDL, LDLCALC, CHOLHDL in the last 168 hours.  ?Hematology ?Recent Labs  ?Lab 07/09/21 ?1727 07/10/21 ?0421  ?WBC 9.0 8.5  ?RBC 4.47 4.56  ?HGB 15.8* 16.0*  ?HCT 47.3* 47.8*  ?MCV 105.8* 104.8*  ?MCH 35.3* 35.1*  ?MCHC 33.4 33.5  ?RDW 13.3 13.3  ?PLT 166 160  ? ?Thyroid  ?Recent Labs  ?Lab 07/10/21 ?0421  ?TSH 3.400  ?  ?BNP ?Recent Labs  ?Lab 07/09/21 ?1727  ?BNP 836.4*  ?  ?DDimer No results for input(s): DDIMER in the last 168 hours.  ? ?Radiology  ?  ?DG Chest 2 View ? ?Result Date: 07/09/2021 ?CLINICAL DATA:  Dyspnea.  CHF. EXAM: CHEST - 2 VIEW  COMPARISON:  06/18/2020 FINDINGS: Previous median sternotomy and CABG procedure. Mild cardiac enlargement. Enlargement of the pulmonary arteries identified compatible with PA hypertension. Small bilateral pleural effusions are identified right greater than left. There is mild diffuse interstitial edema. No airspace opacities. Signs of left axillary nodal dissection identified. IMPRESSION: 1. Mild CHF. 2. Enlarged pulmonary arteries compatible with PA hypertension. Electronically Signed   By: Kerby Moors M.D.   On: 07/09/2021 18:42  ? ?DG CHEST PORT 1 VIEW ? ?Result Date: 07/11/2021 ?CLINICAL DATA:  Worsening hypoxia EXAM: PORTABLE CHEST 1 VIEW COMPARISON:  Previous studies including the examination of 07/09/2021 FINDINGS: Transverse diameter of heart is increased. There is previous coronary bypass surgery. Central pulmonary vessels are less prominent. There is interval decrease in interstitial markings in both lungs possibly suggesting resolving pulmonary edema. Small bilateral pleural effusions are seen. There is no pneumothorax. IMPRESSION: There is interval decrease in pulmonary vascular congestion and pulmonary edema. Small bilateral pleural effusions are seen. Electronically Signed   By: Elmer Picker M.D.   On: 07/11/2021 08:39  ? ?ECHOCARDIOGRAM COMPLETE ? ?Result Date: 07/10/2021 ?   ECHOCARDIOGRAM REPORT   Patient Name:   Marissa Jefferson Date of Exam: 07/10/2021 Medical Rec #:  [id number]  Height:       66.0 in Accession #:    [id number] Weight:       121.0 lb Date of Birth:  1924-03-20  BSA:          1.615 m? Patient Age:    88 years   BP:           108/70 mmHg Patient Gender: F          HR:           75 bpm. Exam Location:  Inpatient Procedure: 2D Echo, Cardiac Doppler and Color Doppler Indications:    CHF- Acute Diastolic  History:        Patient has prior history of Echocardiogram examinations, most                 recent 06/18/2020. CAD; Risk Factors:Hypertension and Diabetes.  Sonographer:    Jefferey Pica Referring Phys: [place]  1. Left ventricular ejection fraction, by estimation, is 55 to 60%. The left ventricle has normal function. The left ventricle has no regional wall motion abnormalities. There is moderate concentric left ventricular hypertrophy. Left ventricular diastolic parameters are consistent with Grade II diastolic dysfunction (pseudonormalization). There is the interventricular septum is flattened in systole and diastole, consistent with right ventricular pressure and volume overload.  2. Right ventricular systolic function is severely reduced. The right ventricular size is mildly enlarged. Moderately increased right ventricular wall thickness. There is severely elevated pulmonary artery systolic pressure. The estimated right ventricular systolic pressure is 95.9 mmHg.  3. Left atrial size was moderately dilated.  4. Right atrial size was moderately dilated.  5. The mitral valve is abnormal. Mild mitral valve regurgitation. No evidence of mitral stenosis.  6. The aortic valve is tricuspid. Aortic valve regurgitation is not visualized. Aortic valve sclerosis/calcification is present, without any evidence of aortic stenosis.  7. The inferior vena cava is dilated in size with >50% respiratory variability, suggesting right atrial pressure of 8 mmHg.  8. Cannot exclude a small PFO. Comparison(s): No significant change from prior study. Conclusion(s)/Recommendation(s): Severe pulmonary hypertension with RV pressure/volume overload. FINDINGS  Left Ventricle: Left ventricular ejection fraction, by estimation, is 55 to 60%. The left ventricle has normal function. The left ventricle has no regional wall motion abnormalities. The left ventricular internal cavity size was normal in size. There is  moderate concentric left ventricular hypertrophy. The interventricular septum is flattened in systole and diastole, consistent with right ventricular pressure and volume overload.  Left ventricular diastolic parameters are consistent with Grade II diastolic dysfunction (pseudonormalization). Right Ventricle: The right ventricular size is mildly enlarged. Moderately increased right ventricular wall t

## 2021-07-11 NOTE — Progress Notes (Signed)
?PROGRESS NOTE ? ?Marissa Jefferson  OEH:[id number] DOB: 02/08/1925 DOA: 07/09/2021 ?PCP: Merrilee Seashore, MD  ? ?Brief Narrative: ? ?Patient is a 88 year old female with history of chronic diastolic heart failure, diabetes type 2, history of breast cancer, coronary artery disease, CVA, hyperlipidemia, hypertension, hypothyroidism, osteopenia who presented with shortness of breath from home.  On presentation she was found to be hypoxic on room air and had to be put on 5 to 6 L of oxygen.  She also mentioned bilateral lower extremity edema.  She is on 2 L of oxygen at baseline.  Chest x-ray on condition showed features of congestive heart failure.  Admitted for management of acute systolic congestive heart failure.  Started on IV Lasix.  Cardiology consulted and following.  Hospital course remarkable for worsening of oxygen requirement, needed high flow oxygen this morning.  Trying to wean down. ? ?Assessment & Plan: ? ?Principal Problem: ?  Acute on chronic combined systolic and diastolic CHF (congestive heart failure) ([facility]) ?Active Problems: ?  Diabetes mellitus type 2, controlled ([facility]) ?  Acute on chronic respiratory failure with hypoxia ([facility]) ?  Essential hypertension ?  Elevated troponin ?  Hypercholesterolemia ?  Coronary artery disease involving native coronary artery of native heart without angina pectoris ?  Hypothyroidism ?  GERD (gastroesophageal reflux disease) ? ? ?Acute on chronic diastolic heart failure: Presented with shortness of breath, bilateral lower extremity edema.  Hypoxic in presentation.  Chest x-ray with features of congestive heart failure.  Elevated BNP.  Started on IV Lasix,now increased to BID.  Continue to monitor input output, daily weight.   ?Echocardiogram done here showed EF of 55 to 03%, grade 2 diastolic dysfunction.  Cardiology following.  She does not look overtly volume overloaded ? ?Acute on chronic hypoxic respiratory failure/pulmonary HTN: Secondary to CHF,pulmonary HTN.  On 2 L  of oxygen at home.  She became more hypoxic last night and this morning requiring 15 L of high flow oxygen.  She is not in respiratory distress or tachypneic.  Auscultation revealed mild bibasilar crackles.  Continue IV Lasix.  Chest x-ray follow-up on 5/2  showed improvement in the pulmonary edema, small bilateral pleural effusions ? ?Elevated troponin: Most likely secondary to supply demand ischemia from CHF, type II MI.  Denies any chest pain.  Continue to monitor ? ?Diabetes type 2: Currently on sliding insulin scale.  Monitor blood sugars ? ?Hypertension: On Norvasc, Coreg at home.  Also on  lisinopril at home, on hold,cardiology planning to switch to rntresto. ?  ?Hyperlipidemia: On Lipitor ? ?History of coronary artery disease: Elevated troponin without any chest pain.  No anginal symptoms.  Continue aspirin, statin, beta-blocker ? ?Hypothyroidism: Continue levothyroxine ? ?GERD: On PPI ? ?Debility/deconditioning: Uses walker, stays in apartment.  PT/OT evaluation done, recommended home health on discharge ? ?  ?  ? ?DVT prophylaxis:SCDs Start: 07/10/21 0252 ? ? ?  Code Status: Partial Code ? ?Family Communication: Called and discussed with daughter on phone on 5/2 ? ?Patient status:Inpatient ? ?Patient is from :Home ? ?Anticipated discharge BC:WUGQ ? ?Estimated DC date:2-3 days ? ? ?Consultants: Cardiology ? ?Procedures:None ? ?Antimicrobials:  ?Anti-infectives (From admission, onward)  ? ? None  ? ?  ? ? ?Subjective: ? ?Patient seen and examined at the bedside this morning.  She was requiring more oxygen today, on 15 L of high flow.  Does not look in respiratory distress and is speaking in full sentences without any cough.  Does not look tachypneic.  Denies any  worsening shortness of breath. ? ?Objective: ?Vitals:  ? 07/11/21 0012 07/11/21 0257 07/11/21 0400 07/11/21 0856  ?BP: 107/60   (!) 143/85  ?Pulse: 60  69 69  ?Resp: 14   18  ?Temp: 97.6 ?F (36.4 ?C)  97.6 ?F (36.4 ?C) 97.7 ?F (36.5 ?C)  ?TempSrc:  Axillary  Axillary Oral  ?SpO2: 97%   93%  ?Weight:  58.7 kg    ?Height:      ? ? ?Intake/Output Summary (Last 24 hours) at 07/11/2021 1051 ?Last data filed at 07/11/2021 0900 ?Gross per 24 hour  ?Intake 240 ml  ?Output 800 ml  ?Net -560 ml  ? ?Filed Weights  ? 07/09/21 1729 07/10/21 1530 07/11/21 0257  ?Weight: 54.9 kg 56 kg 58.7 kg  ? ? ?Examination: ? ? ?General exam: Overall comfortable, not in distress, elderly pleasant female ?HEENT: PERRL ?Respiratory system: Diminished air sounds bilaterally, bilateral basal crackles ?Cardiovascular system: S1 & S2 heard, RRR.  ?Gastrointestinal system: Abdomen is nondistended, soft and nontender. ?Central nervous system: Alert and oriented ?Extremities: No edema, no clubbing ,no cyanosis ?Skin: No rashes, no ulcers,no icterus   ? ?Data Reviewed: I have personally reviewed following labs and imaging studies ? ?CBC: ?Recent Labs  ?Lab 07/09/21 ?1727 07/10/21 ?0421  ?WBC 9.0 8.5  ?NEUTROABS 6.8  --   ?HGB 15.8* 16.0*  ?HCT 47.3* 47.8*  ?MCV 105.8* 104.8*  ?PLT 166 160  ? ?Basic Metabolic Panel: ?Recent Labs  ?Lab 07/09/21 ?1727 07/10/21 ?0421 07/11/21 ?4970  ?NA 135 136 138  ?K 4.6 3.7 3.9  ?CL 99 97* 102  ?CO2 '25 30 28  '$ ?GLUCOSE 149* 150* 145*  ?BUN 40* 33* 31*  ?CREATININE 0.89 0.84 0.86  ?CALCIUM 10.5* 10.0 9.6  ?MG  --  1.8  --   ?PHOS  --  3.2  --   ? ? ? ?Recent Results (from the past 240 hour(s))  ?MRSA Next Gen by PCR, Nasal     Status: None  ? Collection Time: 07/09/21  5:15 PM  ? Specimen: Nasal Mucosa; Nasal Swab  ?Result Value Ref Range Status  ? MRSA by PCR Next Gen NOT DETECTED NOT DETECTED Final  ?  Comment: (NOTE) ?The GeneXpert MRSA Assay (FDA approved for NASAL specimens only), ?is one component of a comprehensive MRSA colonization surveillance ?program. It is not intended to diagnose MRSA infection nor to guide ?or monitor treatment for MRSA infections. ?Test performance is not FDA approved in patients less than 2 years ?old. ?Performed at [facility] [address], [place], [place] ?[address] ?  ?Resp Panel by RT-PCR (Flu A&B, Covid) Nasopharyngeal Swab     Status: None  ? Collection Time: 07/09/21 11:29 PM  ? Specimen: Nasopharyngeal Swab; Nasopharyngeal(NP) swabs in vial transport medium  ?Result Value Ref Range Status  ? SARS Coronavirus 2 by RT PCR NEGATIVE NEGATIVE Final  ?  Comment: (NOTE) ?SARS-CoV-2 target nucleic acids are NOT DETECTED. ? ?The SARS-CoV-2 RNA is generally detectable in upper respiratory ?specimens during the acute phase of infection. The lowest ?concentration of SARS-CoV-2 viral copies this assay can detect is ?138 copies/mL. A negative result does not preclude SARS-Cov-2 ?infection and should not be used as the sole basis for treatment or ?other patient management decisions. A negative result may occur with  ?improper specimen collection/handling, submission of specimen other ?than nasopharyngeal swab, presence of viral mutation(s) within the ?areas targeted by this assay, and inadequate number of viral ?copies(<138 copies/mL). A negative result must be  combined with ?clinical observations, patient history, and epidemiological ?information. The expected result is Negative. ? ?Fact Sheet for Patients:  ?EntrepreneurPulse.com.au ? ?Fact Sheet for Healthcare Providers:  ?IncredibleEmployment.be ? ?This test is no t yet approved or cleared by the [place] FDA and  ?has been authorized for detection and/or diagnosis of SARS-CoV-2 by ?FDA under an Emergency Use Authorization (EUA). This EUA will remain  ?in effect (meaning this test can be used) for the duration of the ?COVID-19 declaration under Section 564(b)(1) of the Act, 21 ?U.S.C.section 360bbb-3(b)(1), unless the authorization is terminated  ?or revoked sooner.  ? ? ?  ? Influenza A by PCR NEGATIVE NEGATIVE Final  ? Influenza B by PCR NEGATIVE NEGATIVE Final  ?  Comment: (NOTE) ?The Xpert Xpress SARS-CoV-2/FLU/RSV plus assay is intended as an  aid ?in the diagnosis of influenza from Nasopharyngeal swab specimens and ?should not be used as a sole basis for treatment. Nasal washings and ?aspirates are unacceptable for Xpert Xpress SARS-CoV-2/FLU/RS

## 2021-07-11 NOTE — Progress Notes (Signed)
Initial Nutrition Assessment ? ?DOCUMENTATION CODES:  ? ?Not applicable, suspect malnutrition but unable to confirm at this time ? ?INTERVENTION:  ? ?- Glucerna Shake po TID, each supplement provides 220 kcal and 10 grams of protein ? ?- Liberalize diet to 2 gram sodium ? ?- Encourage PO intake ? ?NUTRITION DIAGNOSIS:  ? ?Increased nutrient needs related to chronic illness (CHF) as evidenced by estimated needs. ? ?GOAL:  ? ?Patient will meet greater than or equal to 90% of their needs ? ?MONITOR:  ? ?PO intake, Supplement acceptance, Labs, Weight trends, I & O's ? ?REASON FOR ASSESSMENT:  ? ?Consult ?Assessment of nutrition requirement/status ? ?ASSESSMENT:  ? ?88 year old female who presented to the ED on 4/30 with SOB. PMH of CHF, T2DM, breast cancer, CAD s/p CABG, CVA, HLD, HTN, hypothyroidism, osteopenia. Pt admitted with acute on chronic CHF. ? ?Attempted to speak with pt on 2 separate occasions. Pt working with mobility specialist on first attempted visit and speaking with HF Nurse Navigator on second attempted visit. ? ?Discussed pt with RN. RN reports pt with a healthy appetite and is eating well. Noted 30% meal completion documented for breakfast meal this morning. RN reports pt with muscle wasting around BUE and shoulders. Suspect malnutrition but unable to confirm without NFPE. ? ?RN states pt reported consuming some Lean Cuisine meals PTA and did not realize that these meals were high in sodium. Given advanced age and likely malnutrition, will liberalize diet to 2 gram sodium. Recommend liberalizing further if PO intake does not improve.  ? ?RN reports giving pt a Glucerna yesterday and that pt really enjoyed it and requested more. RD to order TID between meals. ? ?UBW: 121-122 lbs ? ?Admit weight: 56 kg ?Current weight: 58.7 kg ? ?Pt with moderate pitting edema to BLE. ? ?Meal Completion: 30% x 1 documented meal ? ?Medications reviewed and include: B-complex with vitamin C, cholecalciferol, colace, IV  lasix, SSI, senna, vitamin B12 5000 mcg daily ? ?Labs reviewed: BUN 31, hemoglobin A1C 7.3 ?CBG's: 153-235 x 24 hours ? ?UOP: 800 ml x 12 hours ?I/O's: -1.5 L since admit ? ?NUTRITION - FOCUSED PHYSICAL EXAM: ? ?Unable to complete at this time. ? ?Diet Order:   ?Diet Order   ? ?       ?  Diet 2 gram sodium Room service appropriate? Yes; Fluid consistency: Thin; Fluid restriction: 1500 mL Fluid  Diet effective now       ?  ? ?  ?  ? ?  ? ? ?EDUCATION NEEDS:  ? ?Not appropriate for education at this time ? ?Skin:  Skin Assessment: Reviewed RN Assessment ? ?Last BM:  07/09/21 ? ?Height:  ? ?Ht Readings from Last 1 Encounters:  ?07/10/21 '5\' 7"'$  (1.702 m)  ? ? ?Weight:  ? ?Wt Readings from Last 1 Encounters:  ?07/11/21 58.7 kg  ? ? ?BMI:  Body mass index is 20.27 kg/m?. ? ?Estimated Nutritional Needs:  ? ?Kcal:  1500-1700 ? ?Protein:  75-85 grams ? ?Fluid:  1.5-1.7 L ? ? ? ?Gustavus Bryant, MS, RD, LDN ?Inpatient Clinical Dietitian ?Please see AMiON for contact information. ? ?

## 2021-07-11 NOTE — Progress Notes (Signed)
?  Mobility Specialist Criteria Algorithm Info. ? ? 07/11/21 1053  ?Oxygen Therapy  ?SpO2 93 %  ?O2 Device Nasal Cannula  ?Patient Activity (if Appropriate) Ambulating  ?Pulse Oximetry Type Continuous  ?Mobility  ?Activity Ambulated with assistance in hallway ?(x4 LE exercises post ambulation; in chair before and after)  ?Range of Motion/Exercises Active;All extremities  ?Level of Assistance Contact guard assist, steadying assist  ?Assistive Device Four wheel walker  ?Distance Ambulated (ft) 80 ft  ?Activity Response Tolerated well  ? ?Patient received in recliner chair eager to participate in mobility. Required min A to steady upon standing + cues for hand placement on RW. Demo x3 sit > stand and 1 minute of marching in place before ambulation. Ambulated in hallway min guard with slow steady gait. Tolerated ambulation on 6LO2, saturating 93% throughout. Returned to room without complaint or incident. Was left with all needs met, call bell in reach. ? ?07/11/2021 ?2:53 PM ? ?[place] Melisssa Donner, CMS, BS EXP ?Acute Rehabilitation Services  ?FCZGQ:[phone] ?Office: [phone] ? ?

## 2021-07-11 NOTE — Plan of Care (Signed)
?  Problem: Education: ?Goal: Ability to verbalize understanding of medication therapies will improve ?Outcome: Progressing ?  ?Problem: Activity: ?Goal: Capacity to carry out activities will improve ?Outcome: Progressing ?  ?Problem: Clinical Measurements: ?Goal: Ability to maintain clinical measurements within normal limits will improve ?Outcome: Progressing ?Goal: Will remain free from infection ?Outcome: Progressing ?  ?Problem: Nutrition: ?Goal: Adequate nutrition will be maintained ?Outcome: Progressing ?  ?Problem: Elimination: ?Goal: Will not experience complications related to urinary retention ?Outcome: Progressing ?  ?Problem: Pain Managment: ?Goal: General experience of comfort will improve ?Outcome: Progressing ?  ?Problem: Education: ?Goal: Ability to demonstrate management of disease process will improve ?Outcome: Not Progressing ?  ?Problem: Cardiac: ?Goal: Ability to achieve and maintain adequate cardiopulmonary perfusion will improve ?Outcome: Not Progressing ?  ?Problem: Education: ?Goal: Knowledge of General Education information will improve ?Description: Including pain rating scale, medication(s)/side effects and non-pharmacologic comfort measures ?Outcome: Not Progressing ?  ?Problem: Health Behavior/Discharge Planning: ?Goal: Ability to manage health-related needs will improve ?Outcome: Not Progressing ?  ?

## 2021-07-12 DIAGNOSIS — R778 Other specified abnormalities of plasma proteins: Secondary | ICD-10-CM | POA: Diagnosis not present

## 2021-07-12 DIAGNOSIS — I5043 Acute on chronic combined systolic (congestive) and diastolic (congestive) heart failure: Secondary | ICD-10-CM | POA: Diagnosis not present

## 2021-07-12 DIAGNOSIS — E1159 Type 2 diabetes mellitus with other circulatory complications: Secondary | ICD-10-CM | POA: Diagnosis not present

## 2021-07-12 DIAGNOSIS — I2510 Atherosclerotic heart disease of native coronary artery without angina pectoris: Secondary | ICD-10-CM | POA: Diagnosis not present

## 2021-07-12 LAB — CBC
HCT: 46.8 % — ABNORMAL HIGH (ref 36.0–46.0)
Hemoglobin: 15.4 g/dL — ABNORMAL HIGH (ref 12.0–15.0)
MCH: 34.4 pg — ABNORMAL HIGH (ref 26.0–34.0)
MCHC: 32.9 g/dL (ref 30.0–36.0)
MCV: 104.5 fL — ABNORMAL HIGH (ref 80.0–100.0)
Platelets: 176 10*3/uL (ref 150–400)
RBC: 4.48 MIL/uL (ref 3.87–5.11)
RDW: 13.2 % (ref 11.5–15.5)
WBC: 8.8 10*3/uL (ref 4.0–10.5)
nRBC: 0 % (ref 0.0–0.2)

## 2021-07-12 LAB — BASIC METABOLIC PANEL
Anion gap: 9 (ref 5–15)
BUN: 34 mg/dL — ABNORMAL HIGH (ref 8–23)
CO2: 27 mmol/L (ref 22–32)
Calcium: 9.8 mg/dL (ref 8.9–10.3)
Chloride: 101 mmol/L (ref 98–111)
Creatinine, Ser: 0.88 mg/dL (ref 0.44–1.00)
GFR, Estimated: >60 mL/min (ref >60)
Glucose, Bld: 144 mg/dL — ABNORMAL HIGH (ref 70–99)
Potassium: 4 mmol/L (ref 3.5–5.1)
Sodium: 137 mmol/L (ref 135–145)

## 2021-07-12 LAB — GLUCOSE, CAPILLARY
Glucose-Capillary: 179 mg/dL — ABNORMAL HIGH (ref 70–99)
Glucose-Capillary: 252 mg/dL — ABNORMAL HIGH (ref 70–99)
Glucose-Capillary: 192 mg/dL — ABNORMAL HIGH (ref 70–99)
Glucose-Capillary: 271 mg/dL — ABNORMAL HIGH (ref 70–99)

## 2021-07-12 MED ORDER — INSULIN ASPART 100 UNIT/ML IJ SOLN
3.0000 [IU] | Freq: Three times a day (TID) | INTRAMUSCULAR | Status: DC
  Administered 2021-07-12 – 2021-07-13 (×4): 3 [IU] via SUBCUTANEOUS

## 2021-07-12 MED ORDER — FUROSEMIDE 40 MG PO TABS
40.0000 mg | ORAL_TABLET | Freq: Two times a day (BID) | ORAL | Status: DC
  Administered 2021-07-12 – 2021-07-13 (×2): 40 mg via ORAL
  Filled 2021-07-12 (×2): qty 1

## 2021-07-12 NOTE — Care Management Important Message (Signed)
Important Message ? ?Patient Details  ?Name: Marissa Jefferson ?MRN: [id number] ?Date of Birth: May 12, 1924 ? ? ?Medicare Important Message Given:  Yes ? ? ? ? ?Jenai Scaletta ?07/12/2021, 3:40 PM ?

## 2021-07-12 NOTE — Consult Note (Signed)
? ?  [facility] CM Inpatient Consult ? ? ?07/12/2021 ? ?Haevyn Gumbs ?15-Apr-1924 ?[id number] ? ?[facility] [ACO] Patient: Marissa Jefferson Medicare ? ?Primary Care Provider:  Merrilee Seashore, MD, [facility] are an Embedded Provider with a Chronic Care Management program and Team ? ?Patient is currently active with [facility] for chronic disease management services.  Patient has been engaged by a [place].  Our community based plan of care has focused on disease management and community resource support.   ? ?1140 am: Came by to round on patient and family gathered at the bedside. ? ?Plan: Follow patient's progress and needs for post hospital transition and disposition ? ?Of note, [facility] Care Management services does not replace or interfere with any services that are needed or arranged by inpatient [facility] care management team.  For additional questions or referrals please contact: ? ?Natividad Brood, RN BSN CCM ?[facility] Liaison ? [phone] business mobile phone ?Toll free office [phone]  ?Fax number: [phone] ?[email or web address] ? ? ? ? ?

## 2021-07-12 NOTE — TOC Progression Note (Signed)
Transition of Care (TOC) - Progression Note  ? ? ?Patient Details  ?Name: Marissa Jefferson ?MRN: [id number] ?Date of Birth: [birth year] ? ?Transition of Care (TOC) CM/SW Contact  ?Angelita Ingles, RN ?Phone Number:[phone] ? ?07/12/2021, 2:18 PM ? ?Clinical Narrative:    ?CM received message stating that daughter would like to discuss home health choice. CM called daughter [name]. Per [name] patient has been on and off with [facility] for home health services for the past year. CM has called referral in to [facility] with [facility]. [facility] has accepted  referral and can provide Home health services AVS to be updated. Per daughter family is requesting ambulance transport home for discharge. CM spoke with [facility] who states that they can transport patient but the patient/family will need to pay $571.50 upfront. Daughter has been made aware and states that she will look at possible alternatives. Daughter inquiring about private sitters for home  case manager gave daughter several website resources to obtain private duty home health aides. Currently there are no other needs noted at this time. TOC will continue to follow for disposition planning.  ? ? ?  ?Barriers to Discharge: Continued Medical Work up ? ?Expected Discharge Plan and Services ?  ?  ?  ?  ?  ?                ?  ?  ?  ?  ?  ?  ?  ?  ?  ?  ? ? ?Social Determinants of Health (SDOH) Interventions ?Food Insecurity Interventions: Intervention Not Indicated ?Housing Interventions: Intervention Not Indicated ?Transportation Interventions: Intervention Not Indicated ? ?Readmission Risk Interventions ?   ? View : No data to display.  ?  ?  ?  ? ? ?

## 2021-07-12 NOTE — Progress Notes (Signed)
Physical Therapy Treatment ?Patient Details ?Name: Marissa Jefferson ?MRN: [id number] ?DOB: May 02, 1924 ?Today's Date: 07/12/2021 ? ? ?History of Present Illness Pt is a 88 y/o female presenting on 4/30 with SOB.  Admitted with CHF. PMH includes: CHF with EF of 20-25%, DM2, breast CA, CAD, CVA, HTN, osteopenia. ? ?  ?PT Comments  ? ? Called pt's daughter to discuss and confirm d/c plan. Pt and her daughter report they plan to utilize transport services to navigate the x20 steps the enter the home and that the daughter is arranging for further support at home. Coordinated with case manager in regards to plan and to call daughter to further discuss per daughter's request. Pt is at high risk for falls, displaying a posterior bias/sway with transfers and gait, varying from needing min guard-minA for transfers and min guard for gait with rollator. Practiced negotiating x4 simulated stairs with bil UE support and minA. Educated pt's daughter and pt on her risk for falls and need for assistance at this time. Will continue to follow acutely. Current recommendations remain appropriate. ? ?  ?Recommendations for follow up therapy are one component of a multi-disciplinary discharge planning process, led by the attending physician.  Recommendations may be updated based on patient status, additional functional criteria and insurance authorization. ? ?Follow Up Recommendations ? Home health PT (transport services into home) ?  ?  ?Assistance Recommended at Discharge Frequent or constant Supervision/Assistance  ?Patient can return home with the following A little help with bathing/dressing/bathroom;Assistance with cooking/housework;Help with stairs or ramp for entrance;Assist for transportation;A little help with walking and/or transfers ?  ?Equipment Recommendations ? None recommended by PT  ?  ?Recommendations for Other Services   ? ? ?  ?Precautions / Restrictions Precautions ?Precautions: Fall ?Precaution Comments: watch O2, now on 4L  wears 2L baseline ?Restrictions ?Weight Bearing Restrictions: No  ?  ? ?Mobility ? Bed Mobility ?Overal bed mobility: Needs Assistance ?Bed Mobility: Supine to Sit, Sit to Supine ?  ?  ?Supine to sit: Supervision, HOB elevated ?Sit to supine: Supervision, HOB elevated ?  ?General bed mobility comments: Supervision for safety, extra time. ?  ? ?Transfers ?Overall transfer level: Needs assistance ?Equipment used: Rolling walker (2 wheels), Rollator (4 wheels) ?Transfers: Sit to/from Stand ?Sit to Stand: Min guard, Min assist ?  ?  ?  ?  ?  ?General transfer comment: Pt not scooting to EOB initially, thus had posterior trunk sway needing minA 2/4x and min guard assist other 2/4x when displayed good anterior weight transition and scooting to edge prior to transfers. Cues to push up from bed. ?  ? ?Ambulation/Gait ?Ambulation/Gait assistance: Min guard ?Gait Distance (Feet): 70 Feet ?Assistive device: Rollator (4 wheels) ?Gait Pattern/deviations: Step-through pattern, Decreased stride length, Narrow base of support, Trunk flexed ?Gait velocity: Decreased ?Gait velocity interpretation: <1.31 ft/sec, indicative of household ambulator ?  ?General Gait Details: Min guard for safety, taking slow, small steps with flexed posture. Occasional posterior trunk sway, but recovers without assistance. ? ? ?Stairs ?Stairs: Yes ?Stairs assistance: Min assist ?Stair Management: Two rails, Step to pattern, Backwards, Forwards ?Number of Stairs: 4 ?General stair comments: Pt initially reluctant to stair training secondary to feeling "not ready to do 20 stairs". Encouraged pt to initiate stair training for easier future stair negotiation even though pt plans to get ambulance transport home (confirmed with daughter via phone call). Simulated stair with x2 mats folded up and back of chair on R for support and RW on L for support to  simulate hand rails. Pt needing minA for stability stepping up anteriorly and stepping down  posteriorly. ? ? ?Wheelchair Mobility ?  ? ?Modified Rankin (Stroke Patients Only) ?  ? ? ?  ?Balance Overall balance assessment: Needs assistance ?Sitting-balance support: No upper extremity supported, Feet supported ?Sitting balance-Leahy Scale: Fair ?  ?  ?Standing balance support: Single extremity supported, Bilateral upper extremity supported, During functional activity ?Standing balance-Leahy Scale: Poor ?Standing balance comment: Reliant on UE support ?  ?  ?  ?  ?  ?  ?  ?  ?  ?  ?  ?  ? ?  ?Cognition Arousal/Alertness: Awake/alert ?Behavior During Therapy: [facility] for tasks assessed/performed ?Overall Cognitive Status: Within Functional Limits for tasks assessed ?  ?  ?  ?  ?  ?  ?  ?  ?  ?  ?  ?  ?  ?  ?  ?  ?General Comments: appears WFL, requires redirection (verbose); good recall of conversations with staff and daughter and memory of prior admissions ?  ?  ? ?  ?Exercises   ? ?  ?General Comments General comments (skin integrity, edema, etc.): Per conversation with OT: pt does not want to go to a SNF; called daughter to discuss the x20 STE her home with daughter confirming plan is to do ambulance transport home to avoid the stairs. Daughter requesting case manager call her to re-arrange [facility] services to desired providers. Contacted case Freight forwarder. ?  ?  ? ?Pertinent Vitals/Pain Pain Assessment ?Pain Assessment: Faces ?Faces Pain Scale: No hurt ?Pain Intervention(s): Monitored during session  ? ? ?Home Living   ?  ?  ?  ?  ?  ?  ?  ?  ?  ?   ?  ?Prior Function    ?  ?  ?   ? ?PT Goals (current goals can now be found in the care plan section) Acute Rehab PT Goals ?Patient Stated Goal: to get better ?PT Goal Formulation: With patient/family ?Time For Goal Achievement: 07/24/21 ?Potential to Achieve Goals: Good ?Progress towards PT goals: Progressing toward goals ? ?  ?Frequency ? ? ? Min 3X/week ? ? ? ?  ?PT Plan Current plan remains appropriate  ? ? ?Co-evaluation   ?  ?  ?  ?  ? ?  ?AM-PAC PT "6 Clicks" Mobility    ?Outcome Measure ? Help needed turning from your back to your side while in a flat bed without using bedrails?: None ?Help needed moving from lying on your back to sitting on the side of a flat bed without using bedrails?: A Little ?Help needed moving to and from a bed to a chair (including a wheelchair)?: A Little ?Help needed standing up from a chair using your arms (e.g., wheelchair or bedside chair)?: A Little ?Help needed to walk in hospital room?: A Little ?Help needed climbing 3-5 steps with a railing? : A Little ?6 Click Score: 19 ? ?  ?End of Session Equipment Utilized During Treatment: Oxygen ?Activity Tolerance: Patient tolerated treatment well ?Patient left: in bed;with call bell/phone within reach;with bed alarm set ?Nurse Communication: Mobility status (NT) ?PT Visit Diagnosis: Unsteadiness on feet (R26.81);Muscle weakness (generalized) (M62.81);Other abnormalities of gait and mobility (R26.89);Difficulty in walking, not elsewhere classified (R26.2) ?  ? ? ?Time: 8250-5397 ?PT Time Calculation (min) (ACUTE ONLY): 38 min ? ?Charges:  $Gait Training: 23-37 mins ?$Therapeutic Activity: 8-22 mins          ?          ? ?  Moishe Spice, PT, DPT ?Acute Rehabilitation Services  ?Pager: [phone] ?Office: [phone] ? ? ? ?Marissa Jefferson ?07/12/2021, 2:32 PM ? ?

## 2021-07-12 NOTE — Plan of Care (Signed)
°  Problem: Education: °Goal: Ability to demonstrate management of disease process will improve °Outcome: Progressing °Goal: Ability to verbalize understanding of medication therapies will improve °Outcome: Progressing °Goal: Individualized Educational Video(s) °Outcome: Progressing °  °

## 2021-07-12 NOTE — Progress Notes (Signed)
Occupational Therapy Treatment ?Patient Details ?Name: Marissa Jefferson ?MRN: [id number] ?DOB: Jul 20, 1924 ?Today's Date: 07/12/2021 ? ? ?History of present illness Pt is a 88 y/o female presenting on 4/30 with SOB.  Admitted with CHF. PMH includes: CHF with EF of 20-25%, DM2, breast CA, CAD, CVA, HTN, osteopenia. ?  ?OT comments ? Patient continues to make steady progress towards goals in skilled OT session. Patient's session encompassed further assessment of home disposition and bed mobility. Therapist speaking at length with patient and patient's son about concern for patient climbing 20 steps to discharge home. Patient is not wanting to go to a SNF, and cannot stay with family. Patient stated she had ambulance transport home at a prior admission and has no qualms doing that again at discharge. OT continues to recommend [place]; OT will continue to follow acutely.   ? ?Recommendations for follow up therapy are one component of a multi-disciplinary discharge planning process, led by the attending physician.  Recommendations may be updated based on patient status, additional functional criteria and insurance authorization. ?   ?Follow Up Recommendations ? Home health OT  ?  ?Assistance Recommended at Discharge Frequent or constant Supervision/Assistance  ?Patient can return home with the following ? A little help with walking and/or transfers;A little help with bathing/dressing/bathroom;Assistance with cooking/housework;Assist for transportation;Help with stairs or ramp for entrance ?  ?Equipment Recommendations ? BSC/3in1  ?  ?Recommendations for Other Services   ? ?  ?Precautions / Restrictions Precautions ?Precautions: Fall ?Precaution Comments: watch O2, now on 4L wears 2L baseline ?Restrictions ?Weight Bearing Restrictions: No  ? ? ?  ? ?Mobility Bed Mobility ?Overal bed mobility: Needs Assistance ?Bed Mobility: Supine to Sit ?  ?  ?Supine to sit: Min guard ?  ?  ?General bed mobility comments: for safety ?  ? ?Transfers ?   ?  ?  ?  ?  ?  ?  ?  ?  ?General transfer comment: declining OOB this session ?  ?  ?Balance Overall balance assessment: Needs assistance ?Sitting-balance support: No upper extremity supported, Feet supported ?Sitting balance-Leahy Scale: Fair ?  ?  ?  ?  ?  ?  ?  ?  ?  ?  ?  ?  ?  ?  ?  ?  ?   ? ?ADL either performed or assessed with clinical judgement  ? ?ADL Overall ADL's : Needs assistance/impaired ?Eating/Feeding: Set up;Sitting ?  ?  ?  ?  ?  ?  ?  ?  ?  ?  ?  ?  ?  ?  ?  ?  ?  ?Functional mobility during ADLs: Min guard ?General ADL Comments: min guard to sit EOB, declining OOB to chair, appears to have increased activity tolerance ?  ? ?Extremity/Trunk Assessment   ?  ?  ?  ?  ?  ? ?Vision   ?  ?  ?Perception   ?  ?Praxis   ?  ? ?Cognition Arousal/Alertness: Awake/alert ?Behavior During Therapy: [facility] for tasks assessed/performed ?Overall Cognitive Status: Within Functional Limits for tasks assessed ?  ?  ?  ?  ?  ?  ?  ?  ?  ?  ?  ?  ?  ?  ?  ?  ?General Comments: appears WFL, requires redirection (verbose) ?  ?  ?   ?Exercises   ? ?  ?Shoulder Instructions   ? ? ?  ?General Comments    ? ? ?Pertinent Vitals/ Pain  Pain Assessment ?Pain Assessment: No/denies pain ? ?Home Living   ?  ?  ?  ?  ?  ?  ?  ?  ?  ?  ?  ?  ?  ?  ?  ?  ?  ?  ? ?  ?Prior Functioning/Environment    ?  ?  ?  ?   ? ?Frequency ? Min 2X/week  ? ? ? ? ?  ?Progress Toward Goals ? ?OT Goals(current goals can now be found in the care plan section) ? Progress towards OT goals: Progressing toward goals ? ?Acute Rehab OT Goals ?Patient Stated Goal: to go home ?OT Goal Formulation: With patient ?Time For Goal Achievement: 07/24/21 ?Potential to Achieve Goals: Good  ?Plan Discharge plan remains appropriate   ? ?Co-evaluation ? ? ?   ?  ?  ?  ?  ? ?  ?AM-PAC OT "6 Clicks" Daily Activity     ?Outcome Measure ? ? Help from another person eating meals?: A Little ?Help from another person taking care of personal grooming?: A Little ?Help from  another person toileting, which includes using toliet, bedpan, or urinal?: A Little ?Help from another person bathing (including washing, rinsing, drying)?: A Little ?Help from another person to put on and taking off regular upper body clothing?: A Little ?Help from another person to put on and taking off regular lower body clothing?: A Lot ?6 Click Score: 17 ? ?  ?End of Session Equipment Utilized During Treatment: Oxygen ? ?OT Visit Diagnosis: Other abnormalities of gait and mobility (R26.89);Muscle weakness (generalized) (M62.81) ?  ?Activity Tolerance Patient tolerated treatment well ?  ?Patient Left in bed;with call bell/phone within reach;with bed alarm set ?  ?Nurse Communication Mobility status;Other (comment) (sitting EOB to eat lunch) ?  ? ?   ? ?Time: 9628-3662 ?OT Time Calculation (min): 20 min ? ?Charges: OT General Charges ?$OT Visit: 1 Visit ?OT Treatments ?$Self Care/Home Management : 8-22 mins ? ?Corinne Ports E. Yamilee Harmes, OTR/L ?Acute Rehabilitation Services ?[phone] ?[phone]  ? ?Corinne Ports Devine Dant ?07/12/2021, 12:53 PM ?

## 2021-07-12 NOTE — Progress Notes (Signed)
Inpatient Diabetes Program Recommendations ? ?AACE/ADA: New Consensus Statement on Inpatient Glycemic Control  ? ?Target Ranges:  Prepandial:   less than 140 mg/dL ?     Peak postprandial:   less than 180 mg/dL (1-2 hours) ?     Critically ill patients:  140 - 180 mg/dL  ? ? Latest Reference Range & Units 07/11/21 07:07 07/11/21 11:34 07/11/21 16:05 07/11/21 21:13 07/12/21 06:20  ?Glucose-Capillary 70 - 99 mg/dL 153 (H) 235 (H) 302 (H) 251 (H) 179 (H)  ? ?Review of Glycemic Control ? ?Diabetes history: DM2 ?Outpatient Diabetes medications: Metformin 1000 mg BID ?Current orders for Inpatient glycemic control: Novolog 0-9 units TID with meals, Novolog 0-5 units QHS ? ?Inpatient Diabetes Program Recommendations:   ? ?Insulin: Please consider ordering Novolog 3 units TID with meals for meal coverage if patient eats at least 50% of meals. ? ?Thanks, ?Barnie Alderman, RN, MSN, CDE ?Diabetes Coordinator ?Inpatient Diabetes Program ?[phone] (Team Pager from 8am to 5pm) ? ? ? ?

## 2021-07-12 NOTE — Progress Notes (Signed)
?PROGRESS NOTE ? ?Marissa Jefferson  ZDG:[id number] DOB: 01-18-1925 DOA: 07/09/2021 ?PCP: Merrilee Seashore, MD  ? ?Brief Narrative: ? ?Patient is a 88 year old female with history of chronic diastolic heart failure, diabetes type 2, history of breast cancer, coronary artery disease, CVA, hyperlipidemia, hypertension, hypothyroidism, osteopenia who presented with shortness of breath from home.  On presentation she was found to be hypoxic on room air and had to be put on 5 to 6 L of oxygen.  She also mentioned bilateral lower extremity edema.  She is on 2 L of oxygen at baseline.  Chest x-ray on condition showed features of congestive heart failure.  Admitted for management of acute systolic congestive heart failure.  Started on IV Lasix.  Cardiology consulted and following.  Hospital course remarkable for worsening of oxygen requirement, needed high flow oxygen this morning.  Trying to wean down. ? ?Assessment & Plan: ? ?Principal Problem: ?  Acute on chronic combined systolic and diastolic CHF (congestive heart failure) ([place]) ?Active Problems: ?  Diabetes mellitus type 2, controlled ([place]) ?  Acute on chronic respiratory failure with hypoxia ([facility]) ?  Essential hypertension ?  Elevated troponin ?  Hypercholesterolemia ?  Coronary artery disease involving native coronary artery of native heart without angina pectoris ?  Hypothyroidism ?  GERD (gastroesophageal reflux disease) ? ? ?Acute on chronic diastolic heart failure: Presented with shortness of breath, bilateral lower extremity edema.  Hypoxic in presentation.  Chest x-ray with features of congestive heart failure.  Elevated BNP.  Started on IV Lasix,now increased to BID.  Continue to monitor input output, daily weight.   ?Echocardiogram done here showed EF of 55 to 95%, grade 2 diastolic dysfunction.  Cardiology following.  She does not look overtly volume overloaded ? ?Acute on chronic hypoxic respiratory failure/pulmonary HTN: Secondary to CHF,pulmonary HTN.  On 2 L  of oxygen at home.  She became more hypoxic last night and this morning requiring 15 L of high flow oxygen.  She is not in respiratory distress or tachypneic.  Auscultation revealed mild bibasilar crackles.  Continue IV Lasix.  Chest x-ray follow-up on 5/2  showed improvement in the pulmonary edema, small bilateral pleural effusions ? ?Elevated troponin: Most likely secondary to supply demand ischemia from CHF, type II MI.  Denies any chest pain.  Continue to monitor ? ?Diabetes type 2: Currently on sliding insulin scale.  Monitor blood sugars ? ?Hypertension: On Norvasc, Coreg at home.  Also on  lisinopril at home, on hold,cardiology planning to switch to rntresto. ?  ?Hyperlipidemia: On Lipitor ? ?History of coronary artery disease: Elevated troponin without any chest pain.  No anginal symptoms.  Continue aspirin, statin, beta-blocker ? ?Hypothyroidism: Continue levothyroxine ? ?GERD: On PPI ? ?Debility/deconditioning: Uses walker, stays in apartment.  PT/OT evaluation done, recommended home health on discharge ? ?Nutrition Problem: Increased nutrient needs ?Etiology: chronic illness (CHF) ?  ? ?DVT prophylaxis:SCDs Start: 07/10/21 0252 ? ? ?  Code Status: Partial Code ? ?Family Communication: Called and discussed with daughter on phone on 5/2 ? ?Patient status:Inpatient ? ?Patient is from :Home ? ?Anticipated discharge JO:ACZY ? ?Estimated DC date:tomorrow ? ? ?Consultants: Cardiology ? ?Procedures:None ? ?Antimicrobials:  ?Anti-infectives (From admission, onward)  ? ? None  ? ?  ? ? ?Subjective: ? ?Patient seen and examined the bedside this morning.  Hemodynamically stable.  On 7 L high flow oxygen.  I did wean the oxygen at the bedside.  She denies any worsening shortness of breath or cough. ?. ? ?Objective: ?  Vitals:  ? 07/11/21 2317 07/12/21 0339 07/12/21 0500 07/12/21 0721  ?BP: 102/81 114/75  115/71  ?Pulse: 65 61  61  ?Resp: '18 16  15  '$ ?Temp: 97.6 ?F (36.4 ?C) 97.6 ?F (36.4 ?C)  (!) 97.5 ?F (36.4 ?C)   ?TempSrc: Oral Axillary  Oral  ?SpO2: 94% 95%  94%  ?Weight:   58.1 kg   ?Height:      ? ? ?Intake/Output Summary (Last 24 hours) at 07/12/2021 1024 ?Last data filed at 07/12/2021 0626 ?Gross per 24 hour  ?Intake 420 ml  ?Output 500 ml  ?Net -80 ml  ? ?Filed Weights  ? 07/10/21 1530 07/11/21 0257 07/12/21 0500  ?Weight: 56 kg 58.7 kg 58.1 kg  ? ? ?Examination: ? ?General exam: Overall comfortable, not in distress, elderly pleasant female ?HEENT: PERRL ?Respiratory system:  no frank wheezes or crackles  ?Cardiovascular system: S1 & S2 heard, RRR.  ?Gastrointestinal system: Abdomen is nondistended, soft and nontender. ?Central nervous system: Alert and oriented ?Extremities: No edema, no clubbing ,no cyanosis ?Skin: No rashes, no ulcers,no icterus   ? ?Data Reviewed: I have personally reviewed following labs and imaging studies ? ?CBC: ?Recent Labs  ?Lab 07/09/21 ?1727 07/10/21 ?0421 07/12/21 ?0108  ?WBC 9.0 8.5 8.8  ?NEUTROABS 6.8  --   --   ?HGB 15.8* 16.0* 15.4*  ?HCT 47.3* 47.8* 46.8*  ?MCV 105.8* 104.8* 104.5*  ?PLT 166 160 176  ? ?Basic Metabolic Panel: ?Recent Labs  ?Lab 07/09/21 ?1727 07/10/21 ?0421 07/11/21 ?9485 07/12/21 ?0108  ?NA 135 136 138 137  ?K 4.6 3.7 3.9 4.0  ?CL 99 97* 102 101  ?CO2 '25 30 28 27  '$ ?GLUCOSE 149* 150* 145* 144*  ?BUN 40* 33* 31* 34*  ?CREATININE 0.89 0.84 0.86 0.88  ?CALCIUM 10.5* 10.0 9.6 9.8  ?MG  --  1.8  --   --   ?PHOS  --  3.2  --   --   ? ? ? ?Recent Results (from the past 240 hour(s))  ?MRSA Next Gen by PCR, Nasal     Status: None  ? Collection Time: 07/09/21  5:15 PM  ? Specimen: Nasal Mucosa; Nasal Swab  ?Result Value Ref Range Status  ? MRSA by PCR Next Gen NOT DETECTED NOT DETECTED Final  ?  Comment: (NOTE) ?The GeneXpert MRSA Assay (FDA approved for NASAL specimens only), ?is one component of a comprehensive MRSA colonization surveillance ?program. It is not intended to diagnose MRSA infection nor to guide ?or monitor treatment for MRSA infections. ?Test performance is not  FDA approved in patients less than 2 years ?old. ?Performed at [facility], [place] [address] [place], [place] ?[address] ?  ?Resp Panel by RT-PCR (Flu A&B, Covid) Nasopharyngeal Swab     Status: None  ? Collection Time: 07/09/21 11:29 PM  ? Specimen: Nasopharyngeal Swab; Nasopharyngeal(NP) swabs in vial transport medium  ?Result Value Ref Range Status  ? SARS Coronavirus 2 by RT PCR NEGATIVE NEGATIVE Final  ?  Comment: (NOTE) ?SARS-CoV-2 target nucleic acids are NOT DETECTED. ? ?The SARS-CoV-2 RNA is generally detectable in upper respiratory ?specimens during the acute phase of infection. The lowest ?concentration of SARS-CoV-2 viral copies this assay can detect is ?138 copies/mL. A negative result does not preclude SARS-Cov-2 ?infection and should not be used as the sole basis for treatment or ?other patient management decisions. A negative result may occur with  ?improper specimen collection/handling, submission of specimen other ?than nasopharyngeal swab, presence of viral mutation(s)  within the ?areas targeted by this assay, and inadequate number of viral ?copies(<138 copies/mL). A negative result must be combined with ?clinical observations, patient history, and epidemiological ?information. The expected result is Negative. ? ?Fact Sheet for Patients:  ?EntrepreneurPulse.com.au ? ?Fact Sheet for Healthcare Providers:  ?IncredibleEmployment.be ? ?This test is no t yet approved or cleared by the [place] FDA and  ?has been authorized for detection and/or diagnosis of SARS-CoV-2 by ?FDA under an Emergency Use Authorization (EUA). This EUA will remain  ?in effect (meaning this test can be used) for the duration of the ?COVID-19 declaration under Section 564(b)(1) of the Act, 21 ?U.S.C.section 360bbb-3(b)(1), unless the authorization is terminated  ?or revoked sooner.  ? ? ?  ? Influenza A by PCR NEGATIVE NEGATIVE Final  ? Influenza B by PCR NEGATIVE NEGATIVE Final  ?   Comment: (NOTE) ?The Xpert Xpress SARS-CoV-2/FLU/RSV plus assay is intended as an aid ?in the diagnosis of influenza from Nasopharyngeal swab specimens and ?should not be used as a sole basis for treatment. Na

## 2021-07-12 NOTE — Progress Notes (Signed)
? ?Progress Note ? ?Patient Name: Marissa Jefferson ?Date of Encounter: 07/12/2021 ? ?[facility] Cardiologist: Sanda Klein, MD  ? ?Subjective  ? ?Feeling OK.  Breathing improving. Afraid of going home tomorrow.  She doesn't think she can get up the steps to her home.  ? ?Inpatient Medications  ?  ?Scheduled Meds: ? amLODipine  10 mg Oral Daily  ? aspirin EC  81 mg Oral Daily  ? atorvastatin  80 mg Oral Daily  ? B-complex with vitamin C  1 tablet Oral Daily  ? carvedilol  25 mg Oral BID WC  ? cholecalciferol  5,000 Units Oral QAC lunch  ? docusate sodium  100 mg Oral BID  ? feeding supplement (GLUCERNA SHAKE)  237 mL Oral TID BM  ? furosemide  40 mg Intravenous BID  ? Gerhardt's butt cream   Topical BID  ? insulin aspart  0-5 Units Subcutaneous QHS  ? insulin aspart  0-9 Units Subcutaneous TID WC  ? insulin aspart  3 Units Subcutaneous TID WC  ? levothyroxine  150 mcg Oral QAC breakfast  ? sacubitril-valsartan  1 tablet Oral BID  ? senna  1 tablet Oral BID  ? sodium chloride flush  3 mL Intravenous Q12H  ? vitamin B-12  5,000 mcg Oral QAC lunch  ? ?Continuous Infusions: ? sodium chloride    ? ?PRN Meds: ?sodium chloride, acetaminophen **OR** acetaminophen, bisacodyl, diclofenac Sodium, HYDROcodone-acetaminophen, polyethylene glycol, sodium chloride flush  ? ?Vital Signs  ?  ?Vitals:  ? 07/11/21 2317 07/12/21 0339 07/12/21 0500 07/12/21 0721  ?BP: 102/81 114/75  115/71  ?Pulse: 65 61  61  ?Resp: '18 16  15  '$ ?Temp: 97.6 ?F (36.4 ?C) 97.6 ?F (36.4 ?C)  (!) 97.5 ?F (36.4 ?C)  ?TempSrc: Oral Axillary  Oral  ?SpO2: 94% 95%  94%  ?Weight:   58.1 kg   ?Height:      ? ? ?Intake/Output Summary (Last 24 hours) at 07/12/2021 1059 ?Last data filed at 07/12/2021 6283 ?Gross per 24 hour  ?Intake 420 ml  ?Output 500 ml  ?Net -80 ml  ? ? ?  07/12/2021  ?  5:00 AM 07/11/2021  ?  2:57 AM 07/10/2021  ?  3:30 PM  ?Last 3 Weights  ?Weight (lbs) 128 lb 1.4 oz 129 lb 6.6 oz 123 lb 7.3 oz  ?Weight (kg) 58.1 kg 58.7 kg 56 kg  ?   ? ?Telemetry  ?  ?Sinus  bradycardia, sinus rhythm, PVCs - Personally Reviewed ? ?ECG  ?  ?[name] - Personally Reviewed ? ?Physical Exam  ? ?VS:  BP 115/71 (BP Location: Right Arm)   Pulse 61   Temp (!) 97.5 ?F (36.4 ?C) (Oral)   Resp 15   Ht '5\' 7"'$  (1.702 m)   Wt 58.1 kg   SpO2 94%   BMI 20.06 kg/m?  , BMI Body mass index is 20.06 kg/m?. ?GENERAL:  Well appearing.  Frail ?HEENT: Pupils equal round and reactive, fundi not visualized, oral mucosa unremarkable ?NECK:  No jugular venous distention, waveform within normal limits, carotid upstroke brisk and symmetric, no bruits, no thyromegaly ?LUNGS:  Mild basilar crackles ?HEART:  RRR.  PMI not displaced or sustained,S1 and S2 within normal limits, no S3, no S4, no clicks, no rubs, no murmurs ?ABD:  Flat, positive bowel sounds normal in frequency in pitch, no bruits, no rebound, no guarding, no midline pulsatile mass, no hepatomegaly, no splenomegaly ?EXT:  2 plus pulses throughout, No LE edema, no cyanosis no clubbing ?  SKIN:  No rashes no nodules ?NEURO:  Cranial nerves II through XII grossly intact, motor grossly intact throughout ?PSYCH:  Cognitively intact, oriented to person place and time ? ? ?Labs  ?  ?High Sensitivity Troponin:   ?Recent Labs  ?Lab 07/09/21 ?1727 07/09/21 ?1927 07/10/21 ?0421  ?TROPONINIHS 35* 35* 38*  ?   ?Chemistry ?Recent Labs  ?Lab 07/09/21 ?1727 07/10/21 ?0421 07/11/21 ?6269 07/12/21 ?0108  ?NA 135 136 138 137  ?K 4.6 3.7 3.9 4.0  ?CL 99 97* 102 101  ?CO2 '25 30 28 27  '$ ?GLUCOSE 149* 150* 145* 144*  ?BUN 40* 33* 31* 34*  ?CREATININE 0.89 0.84 0.86 0.88  ?CALCIUM 10.5* 10.0 9.6 9.8  ?MG  --  1.8  --   --   ?PROT 6.3* 6.0*  --   --   ?ALBUMIN 3.8 3.4*  --   --   ?AST 35 25  --   --   ?ALT 38 33  --   --   ?ALKPHOS 34* 31*  --   --   ?BILITOT 0.7 0.9  --   --   ?GFRNONAA >60 >60 >60 >60  ?ANIONGAP '11 9 8 9  '$ ?  ?Lipids No results for input(s): CHOL, TRIG, HDL, LABVLDL, LDLCALC, CHOLHDL in the last 168 hours.  ?Hematology ?Recent Labs  ?Lab 07/09/21 ?1727  07/10/21 ?0421 07/12/21 ?0108  ?WBC 9.0 8.5 8.8  ?RBC 4.47 4.56 4.48  ?HGB 15.8* 16.0* 15.4*  ?HCT 47.3* 47.8* 46.8*  ?MCV 105.8* 104.8* 104.5*  ?MCH 35.3* 35.1* 34.4*  ?MCHC 33.4 33.5 32.9  ?RDW 13.3 13.3 13.2  ?PLT 166 160 176  ? ?Thyroid  ?Recent Labs  ?Lab 07/10/21 ?0421  ?TSH 3.400  ?  ?BNP ?Recent Labs  ?Lab 07/09/21 ?1727  ?BNP 836.4*  ?  ?DDimer No results for input(s): DDIMER in the last 168 hours.  ? ?Radiology  ?  ?DG CHEST PORT 1 VIEW ? ?Result Date: 07/11/2021 ?CLINICAL DATA:  Worsening hypoxia EXAM: PORTABLE CHEST 1 VIEW COMPARISON:  Previous studies including the examination of 07/09/2021 FINDINGS: Transverse diameter of heart is increased. There is previous coronary bypass surgery. Central pulmonary vessels are less prominent. There is interval decrease in interstitial markings in both lungs possibly suggesting resolving pulmonary edema. Small bilateral pleural effusions are seen. There is no pneumothorax. IMPRESSION: There is interval decrease in pulmonary vascular congestion and pulmonary edema. Small bilateral pleural effusions are seen. Electronically Signed   By: Elmer Picker M.D.   On: 07/11/2021 08:39  ? ?ECHOCARDIOGRAM COMPLETE ? ?Result Date: 07/10/2021 ?   ECHOCARDIOGRAM REPORT   Patient Name:   BENA KOBEL Date of Exam: 07/10/2021 Medical Rec #:  [id number]  Height:       66.0 in Accession #:    [id number] Weight:       121.0 lb Date of Birth:  1924-12-13  BSA:          1.615 m? Patient Age:    88 years   BP:           108/70 mmHg Patient Gender: F          HR:           75 bpm. Exam Location:  Inpatient Procedure: 2D Echo, Cardiac Doppler and Color Doppler Indications:    CHF- Acute Diastolic  History:        Patient has prior history of Echocardiogram examinations, most  recent 06/18/2020. CAD; Risk Factors:Hypertension and Diabetes.  Sonographer:    Jefferey Pica Referring Phys: [place]  1. Left ventricular ejection fraction, by estimation, is  55 to 60%. The left ventricle has normal function. The left ventricle has no regional wall motion abnormalities. There is moderate concentric left ventricular hypertrophy. Left ventricular diastolic parameters are consistent with Grade II diastolic dysfunction (pseudonormalization). There is the interventricular septum is flattened in systole and diastole, consistent with right ventricular pressure and volume overload.  2. Right ventricular systolic function is severely reduced. The right ventricular size is mildly enlarged. Moderately increased right ventricular wall thickness. There is severely elevated pulmonary artery systolic pressure. The estimated right ventricular systolic pressure is 16.1 mmHg.  3. Left atrial size was moderately dilated.  4. Right atrial size was moderately dilated.  5. The mitral valve is abnormal. Mild mitral valve regurgitation. No evidence of mitral stenosis.  6. The aortic valve is tricuspid. Aortic valve regurgitation is not visualized. Aortic valve sclerosis/calcification is present, without any evidence of aortic stenosis.  7. The inferior vena cava is dilated in size with >50% respiratory variability, suggesting right atrial pressure of 8 mmHg.  8. Cannot exclude a small PFO. Comparison(s): No significant change from prior study. Conclusion(s)/Recommendation(s): Severe pulmonary hypertension with RV pressure/volume overload. FINDINGS  Left Ventricle: Left ventricular ejection fraction, by estimation, is 55 to 60%. The left ventricle has normal function. The left ventricle has no regional wall motion abnormalities. The left ventricular internal cavity size was normal in size. There is  moderate concentric left ventricular hypertrophy. The interventricular septum is flattened in systole and diastole, consistent with right ventricular pressure and volume overload. Left ventricular diastolic parameters are consistent with Grade II diastolic dysfunction (pseudonormalization). Right  Ventricle: The right ventricular size is mildly enlarged. Moderately increased right ventricular wall thickness. Right ventricular systolic function is severely reduced. There is severely elevated pulmonary artery systolic

## 2021-07-13 ENCOUNTER — Other Ambulatory Visit (HOSPITAL_COMMUNITY): Payer: Self-pay

## 2021-07-13 DIAGNOSIS — I5043 Acute on chronic combined systolic (congestive) and diastolic (congestive) heart failure: Secondary | ICD-10-CM | POA: Diagnosis not present

## 2021-07-13 LAB — GLUCOSE, CAPILLARY
Glucose-Capillary: 164 mg/dL — ABNORMAL HIGH (ref 70–99)
Glucose-Capillary: 289 mg/dL — ABNORMAL HIGH (ref 70–99)
Glucose-Capillary: 245 mg/dL — ABNORMAL HIGH (ref 70–99)

## 2021-07-13 LAB — BASIC METABOLIC PANEL
Anion gap: 7 (ref 5–15)
BUN: 38 mg/dL — ABNORMAL HIGH (ref 8–23)
CO2: 30 mmol/L (ref 22–32)
Calcium: 9.9 mg/dL (ref 8.9–10.3)
Chloride: 100 mmol/L (ref 98–111)
Creatinine, Ser: 1.13 mg/dL — ABNORMAL HIGH (ref 0.44–1.00)
GFR, Estimated: 45 mL/min — ABNORMAL LOW (ref >60)
Glucose, Bld: 149 mg/dL — ABNORMAL HIGH (ref 70–99)
Potassium: 3.8 mmol/L (ref 3.5–5.1)
Sodium: 137 mmol/L (ref 135–145)

## 2021-07-13 MED ORDER — FUROSEMIDE 40 MG PO TABS
40.0000 mg | ORAL_TABLET | Freq: Every day | ORAL | 1 refills | Status: DC
  Filled 2021-07-13: qty 30, 30d supply, fill #0

## 2021-07-13 MED ORDER — AMLODIPINE BESYLATE 5 MG PO TABS: 5.0000 mg | ORAL_TABLET | Freq: Every day | ORAL | Status: DC

## 2021-07-13 MED ORDER — AMLODIPINE BESYLATE 5 MG PO TABS
5.0000 mg | ORAL_TABLET | Freq: Every day | ORAL | 1 refills | Status: AC
  Filled 2021-07-13: qty 30, 30d supply, fill #0

## 2021-07-13 MED ORDER — FUROSEMIDE 40 MG PO TABS: 40.0000 mg | ORAL_TABLET | Freq: Every day | ORAL | Status: DC

## 2021-07-13 MED ORDER — SACUBITRIL-VALSARTAN 24-26 MG PO TABS
1.0000 | ORAL_TABLET | Freq: Two times a day (BID) | ORAL | 1 refills | Status: AC
Start: 2021-07-13 — End: ?
  Filled 2021-07-13: qty 60, 30d supply, fill #0

## 2021-07-13 NOTE — TOC Transition Note (Signed)
Transition of Care (TOC) - CM/SW Discharge Note ? ? ?Patient Details  ?Name: Suleika Donavan ?MRN: [id number] ?Date of Birth: 05/12/1924 ? ?Transition of Care (TOC) CM/SW Contact:  ?Angelita Ingles, RN ?Phone [phone] ? ?07/13/2021, 11:59 AM ? ? ?Clinical Narrative:    ?CM following for patient discharging home with updated oxygen orders. Patient is currently active with [employer] for home O2. CM called [employer] and spoke with [place] to update [employer] on the new orders. Per [place] will pick up conserving device and switch out for continuous . Daughter has confirmed that she will bring portable tank for discharge. ? ? ?  ?Barriers to Discharge: Continued Medical Work up ? ? ?Patient Goals and CMS Choice ?  ?  ?  ? ?Discharge Placement ?  ?           ?  ?  ?  ?  ? ?Discharge Plan and Services ?  ?  ?           ?  ?  ?  ?  ?  ?  ?  ?  ?  ?  ? ?Social Determinants of Health (SDOH) Interventions ?Food Insecurity Interventions: Intervention Not Indicated ?Housing Interventions: Intervention Not Indicated ?Transportation Interventions: Intervention Not Indicated ? ? ?Readmission Risk Interventions ?   ? View : No data to display.  ?  ?  ?  ? ? ? ? ? ?

## 2021-07-13 NOTE — Discharge Summary (Signed)
Physician Discharge Summary  ?Marissa Jefferson BJS:[id number] DOB: Oct 05, 1924 DOA: 07/09/2021 ? ?PCP: Merrilee Seashore, MD ? ?Admit date: 07/09/2021 ?Discharge date: 07/13/2021 ? ?Admitted From: Home ?Disposition:  Home ? ?Discharge Condition:Stable ?CODE STATUS:Partial code ?Diet recommendation: Heart Healthy  ? ?Brief/Interim Summary: ?Patient is a 88 year old female with history of chronic diastolic heart failure, diabetes type 2, history of breast cancer, coronary artery disease, CVA, hyperlipidemia, hypertension, hypothyroidism, osteopenia who presented with shortness of breath from home.  On presentation she was found to be hypoxic on room air and had to be put on 5 to 6 L of oxygen.  She also mentioned bilateral lower extremity edema.  She is on 2 L of oxygen at baseline.  Chest x-ray on condition showed features of congestive heart failure.  Admitted for management of acute systolic congestive heart failure.  Started on IV Lasix.  Cardiology consulted and following.  Hospital course remarkable for worsening of oxygen requirement, needed high flow oxygen this morning.  Respiratory status has now improved.  Cardiology cleared for discharge.  PT/OT recommended home health on discharge.  Medically stable for discharge today. ? ?Following problems were addressed during her hospitalization: ? ?Acute on chronic diastolic heart failure: Presented with shortness of breath, bilateral lower extremity edema.  Hypoxic in presentation.  Chest x-ray with features of congestive heart failure.  Elevated BNP.  Started on IV Lasix,now changed to oral. ?Echocardiogram done here showed EF of 55 to 60%, grade 2 diastolic dysfunction.  Cardiology were following.  She would follow-up with cardiology as an outpatient. ?  ?Acute on chronic hypoxic respiratory failure/pulmonary HTN: Secondary to CHF,pulmonary HTN.  On 2 L of oxygen at home.   Chest x-ray follow-up on 5/2  showed improvement in the pulmonary edema, small bilateral pleural  effusions.  She required 4 L of oxygen on discharge. ?  ?Elevated troponin: Most likely secondary to supply demand ischemia from CHF, type II MI.  Denies any chest pain.   ?  ?Diabetes type 2: Continue home regimen ? ?Hypertension: On Norvasc, Coreg at home.  Also on  lisinopril at home, on hold,cardiology switched to [facility] ?  ?Hyperlipidemia: On Lipitor ?  ?History of coronary artery disease: Elevated troponin without any chest pain.  No anginal symptoms.  Continue aspirin, statin, beta-blocker ?  ?Hypothyroidism: Continue levothyroxine ?  ?GERD: On PPI ?  ?Debility/deconditioning: Uses walker, stays in apartment.  PT/OT evaluation done, recommended home health on discharge ? ? ? ?Discharge Diagnoses:  ?Principal Problem: ?  Acute on chronic combined systolic and diastolic CHF (congestive heart failure) ([place]) ?Active Problems: ?  Diabetes mellitus type 2, controlled ([place]) ?  Acute on chronic respiratory failure with hypoxia ([facility]) ?  Essential hypertension ?  Elevated troponin ?  Hypercholesterolemia ?  Coronary artery disease involving native coronary artery of native heart without angina pectoris ?  Hypothyroidism ?  GERD (gastroesophageal reflux disease) ? ? ? ?Discharge Instructions ? ?Discharge Instructions   ? ? Diet - low sodium heart healthy   Complete by: As directed ?  ? Discharge instructions   Complete by: As directed ?  ? 1)Please take prescribed medications as instructed ?2)Follow up with your PCP in a week ?3)You will be called by cardiology for follow-up appointment  ? Increase activity slowly   Complete by: As directed ?  ? ?  ? ?Allergies as of 07/13/2021   ? ?   Reactions  ? Amiodarone Other (See Comments)  ? Bradycardia  ? Crestor [rosuvastatin] Nausea Only  ?  Sitagliptin Other (See Comments)  ? Caused urinary frequency  ? ?  ? ?  ?Medication List  ?  ? ?STOP taking these medications   ? ?lisinopril 10 MG tablet ?Commonly known as: ZESTRIL ?  ?magnesium oxide 400 (240 Mg) MG tablet ?Commonly  known as: MAG-OX ?  ?traMADol 50 MG tablet ?Commonly known as: ULTRAM ?  ? ?  ? ?TAKE these medications   ? ?acetaminophen 500 MG tablet ?Commonly known as: TYLENOL ?Take 1,000 mg by mouth See admin instructions. Take 1,000 mg by mouth in the morning & at bedtime and an additional 500 mg once a day as needed for pain/discomfort ?  ?amLODipine 5 MG tablet ?Commonly known as: NORVASC ?Take 1 tablet (5 mg total) by mouth daily. ?Start taking on: Jul 14, 2021 ?What changed:  ?medication strength ?how much to take ?  ?aspirin EC 81 MG tablet ?Take 1 tablet (81 mg total) by mouth daily. ?  ?atorvastatin 80 MG tablet ?Commonly known as: LIPITOR ?Take 80 mg by mouth daily. ?  ?Biotin 5000 MCG Subl ?Place 5,000 mcg under the tongue daily. ?  ?carvedilol 25 MG tablet ?Commonly known as: COREG ?TAKE 1 TABLET BY MOUTH TWICE A DAY WITH MEALS ?  ?denosumab 60 MG/ML Sosy injection ?Commonly known as: PROLIA ?Inject 60 mg into the skin every 6 (six) months. ?  ?docusate sodium 100 MG capsule ?Commonly known as: COLACE ?Take 100 mg by mouth 2 (two) times daily. ?  ?furosemide 40 MG tablet ?Commonly known as: LASIX ?Take 1 tablet (40 mg total) by mouth daily. ?Start taking on: Jul 14, 2021 ?What changed: how much to take ?  ?levothyroxine 150 MCG tablet ?Commonly known as: SYNTHROID ?Take 150 mcg by mouth daily before breakfast. ?  ?magnesium gluconate 500 MG tablet ?Commonly known as: MAGONATE ?Take 500 mg by mouth every evening. ?  ?metFORMIN 500 MG tablet ?Commonly known as: GLUCOPHAGE ?Take 1,000 mg by mouth 2 (two) times daily. ?  ?OXYGEN ?Inhale 2-3 L into the lungs continuous. ?  ?potassium chloride 10 MEQ tablet ?Commonly known as: KLOR-CON M ?Take 10 mEq by mouth daily. ?  ?protein supplement shake Liqd ?Commonly known as: PREMIER PROTEIN ?Take 237 mLs by mouth in the morning. ?  ?sacubitril-valsartan 24-26 MG ?Commonly known as: ENTRESTO ?Take 1 tablet by mouth 2 (two) times daily. ?  ?solifenacin 10 MG tablet ?Commonly known  as: VESICARE ?Take 10 mg by mouth at bedtime. ?  ?SUPER B-50 COMPLEX PO ?Take 1 tablet by mouth daily before lunch. ?  ?Vitamin B-12 5000 MCG Subl ?Place 5,000 mcg under the tongue daily before lunch. ?  ?Vitamin D3 125 MCG (5000 UT) Caps ?Take 5,000 Units by mouth daily before lunch. ?  ?Voltaren 1 % Gel ?Generic drug: diclofenac Sodium ?Apply 2 g topically 2 (two) times daily as needed (for bilateral knee pain). ?  ? ?  ? ?  ?  ? ? ?  ?Durable Medical Equipment  ?(From admission, onward)  ?  ? ? ?  ? ?  Start     Ordered  ? 07/13/21 1018  For home use only DME oxygen  Once       ?Question Answer Comment  ?Length of Need Lifetime   ?Mode or (Route) Nasal cannula   ?Liters per Minute 4   ?Frequency Continuous (stationary and portable oxygen unit needed)   ?Oxygen delivery system Gas   ?  ? 07/13/21 1018  ? ?  ?  ? ?  ? ?  Follow-up Information   ? ? [facility] [place] Follow up.   ?Specialty: Home Health Services ?Why: Your home health has been set up with [facility]. The office will call you with start of care information. If you have any questions or concerns please call the number listed above. ?Contact information: ?[address] ?[address] ?[place] [address] ?[phone] ? ? ?  ?  ? ?  ?  ? ?  ? ?Allergies  ?Allergen Reactions  ? Amiodarone Other (See Comments)  ?  Bradycardia  ? Crestor [Rosuvastatin] Nausea Only  ? Sitagliptin Other (See Comments)  ?  Caused urinary frequency  ? ? ?Consultations: ?Cardiology ? ? ?Procedures/Studies: ?DG Chest 2 View ? ?Result Date: 07/09/2021 ?CLINICAL DATA:  Dyspnea.  CHF. EXAM: CHEST - 2 VIEW COMPARISON:  06/18/2020 FINDINGS: Previous median sternotomy and CABG procedure. Mild cardiac enlargement. Enlargement of the pulmonary arteries identified compatible with PA hypertension. Small bilateral pleural effusions are identified right greater than left. There is mild diffuse interstitial edema. No airspace opacities. Signs of left axillary nodal dissection identified.  IMPRESSION: 1. Mild CHF. 2. Enlarged pulmonary arteries compatible with PA hypertension. Electronically Signed   By: Kerby Moors M.D.   On: 07/09/2021 18:42  ? ?DG CHEST PORT 1 VIEW ? ?Result Date: 07/11/2021 ?

## 2021-07-13 NOTE — Progress Notes (Signed)
? ?Progress Note ? ?Patient Name: Marissa Jefferson ?Date of Encounter: 07/13/2021 ? ?[facility] Cardiologist: Sanda Klein, MD  ? ?Subjective  ? ?Breathing better.  Anxious about the plan for going home.  ? ?Inpatient Medications  ?  ?Scheduled Meds: ? amLODipine  10 mg Oral Daily  ? aspirin EC  81 mg Oral Daily  ? atorvastatin  80 mg Oral Daily  ? B-complex with vitamin C  1 tablet Oral Daily  ? carvedilol  25 mg Oral BID WC  ? cholecalciferol  5,000 Units Oral QAC lunch  ? docusate sodium  100 mg Oral BID  ? feeding supplement (GLUCERNA SHAKE)  237 mL Oral TID BM  ? [START ON 07/14/2021] furosemide  40 mg Oral Daily  ? Gerhardt's butt cream   Topical BID  ? insulin aspart  0-5 Units Subcutaneous QHS  ? insulin aspart  0-9 Units Subcutaneous TID WC  ? insulin aspart  3 Units Subcutaneous TID WC  ? levothyroxine  150 mcg Oral QAC breakfast  ? sacubitril-valsartan  1 tablet Oral BID  ? senna  1 tablet Oral BID  ? sodium chloride flush  3 mL Intravenous Q12H  ? vitamin B-12  5,000 mcg Oral QAC lunch  ? ?Continuous Infusions: ? sodium chloride    ? ?PRN Meds: ?sodium chloride, acetaminophen **OR** acetaminophen, bisacodyl, diclofenac Sodium, HYDROcodone-acetaminophen, polyethylene glycol, sodium chloride flush  ? ?Vital Signs  ?  ?Vitals:  ? 07/12/21 1922 07/12/21 2300 07/13/21 0419 07/13/21 0800  ?BP: (!) 92/55 108/69 126/81 101/85  ?Pulse: (!) 57 73 77 64  ?Resp: '15 17 15 15  '$ ?Temp: (!) 97.4 ?F (36.3 ?C) 98.1 ?F (36.7 ?C) 97.6 ?F (36.4 ?C) 98.3 ?F (36.8 ?C)  ?TempSrc: Oral Oral Oral Oral  ?SpO2: 90% 90% 93% 90%  ?Weight:      ?Height:      ? ? ?Intake/Output Summary (Last 24 hours) at 07/13/2021 0926 ?Last data filed at 07/13/2021 0400 ?Gross per 24 hour  ?Intake 180 ml  ?Output 450 ml  ?Net -270 ml  ? ? ?  07/12/2021  ?  5:00 AM 07/11/2021  ?  2:57 AM 07/10/2021  ?  3:30 PM  ?Last 3 Weights  ?Weight (lbs) 128 lb 1.4 oz 129 lb 6.6 oz 123 lb 7.3 oz  ?Weight (kg) 58.1 kg 58.7 kg 56 kg  ?   ? ?Telemetry  ?  ?Sinus rhythm, PVCs -  Personally Reviewed ? ?ECG  ?  ?[name] - Personally Reviewed ? ?Physical Exam  ? ?VS:  BP 101/85 (BP Location: Right Arm)   Pulse 64   Temp 98.3 ?F (36.8 ?C) (Oral)   Resp 15   Ht '5\' 7"'$  (1.702 m)   Wt 58.1 kg   SpO2 90%   BMI 20.06 kg/m?  , BMI Body mass index is 20.06 kg/m?. ?GENERAL:  Well appearing.  Frail ?HEENT: Pupils equal round and reactive, fundi not visualized, oral mucosa unremarkable ?NECK:  No jugular venous distention, waveform within normal limits, carotid upstroke brisk and symmetric, no bruits, no thyromegaly ?LUNGS:  CTAB.   ?HEART:  RRR.  PMI not displaced or sustained,S1 and S2 within normal limits, no S3, no S4, no clicks, no rubs, no murmurs ?ABD:  Flat, positive bowel sounds normal in frequency in pitch, no bruits, no rebound, no guarding, no midline pulsatile mass, no hepatomegaly, no splenomegaly ?EXT:  2 plus pulses throughout, No LE edema, no cyanosis no clubbing ?SKIN:  No rashes no nodules ?NEURO:  Cranial  nerves II through XII grossly intact, motor grossly intact throughout ?PSYCH:  Cognitively intact, oriented to person place and time ? ? ?Labs  ?  ?High Sensitivity Troponin:   ?Recent Labs  ?Lab 07/09/21 ?1727 07/09/21 ?1927 07/10/21 ?0421  ?TROPONINIHS 35* 35* 38*  ?   ?Chemistry ?Recent Labs  ?Lab 07/09/21 ?1727 07/10/21 ?0421 07/11/21 ?9628 07/12/21 ?0108 07/13/21 ?3662  ?NA 135 136 138 137 137  ?K 4.6 3.7 3.9 4.0 3.8  ?CL 99 97* 102 101 100  ?CO2 '25 30 28 27 30  '$ ?GLUCOSE 149* 150* 145* 144* 149*  ?BUN 40* 33* 31* 34* 38*  ?CREATININE 0.89 0.84 0.86 0.88 1.13*  ?CALCIUM 10.5* 10.0 9.6 9.8 9.9  ?MG  --  1.8  --   --   --   ?PROT 6.3* 6.0*  --   --   --   ?ALBUMIN 3.8 3.4*  --   --   --   ?AST 35 25  --   --   --   ?ALT 38 33  --   --   --   ?ALKPHOS 34* 31*  --   --   --   ?BILITOT 0.7 0.9  --   --   --   ?GFRNONAA >60 >60 >60 >60 45*  ?ANIONGAP '11 9 8 9 7  '$ ?  ?Lipids No results for input(s): CHOL, TRIG, HDL, LABVLDL, LDLCALC, CHOLHDL in the last 168 hours.  ?Hematology ?Recent  Labs  ?Lab 07/09/21 ?1727 07/10/21 ?0421 07/12/21 ?0108  ?WBC 9.0 8.5 8.8  ?RBC 4.47 4.56 4.48  ?HGB 15.8* 16.0* 15.4*  ?HCT 47.3* 47.8* 46.8*  ?MCV 105.8* 104.8* 104.5*  ?MCH 35.3* 35.1* 34.4*  ?MCHC 33.4 33.5 32.9  ?RDW 13.3 13.3 13.2  ?PLT 166 160 176  ? ?Thyroid  ?Recent Labs  ?Lab 07/10/21 ?0421  ?TSH 3.400  ?  ?BNP ?Recent Labs  ?Lab 07/09/21 ?1727  ?BNP 836.4*  ?  ?DDimer No results for input(s): DDIMER in the last 168 hours.  ? ?Radiology  ?  ?No results found. ? ?Cardiac Studies  ? ?Echo 06/18/20 ? 1. Left ventricular ejection fraction, by estimation, is 55 to 60%. The  ?left ventricle has normal function. The left ventricle has no regional  ?wall motion abnormalities. There is moderate left ventricular hypertrophy.  ?Left ventricular diastolic  ?parameters are consistent with Grade II diastolic dysfunction  ?(pseudonormalization). Elevated left ventricular end-diastolic pressure.  ?There is the interventricular septum is flattened in diastole ('D' shaped  ?left ventricle), consistent with right  ?ventricular volume overload and the interventricular septum is flattened  ?in systole, consistent with right ventricular pressure overload.  ? 2. Right ventricular systolic function is severely reduced. The right  ?ventricular size is mildly enlarged. There is severely elevated pulmonary  ?artery systolic pressure. The estimated right ventricular systolic  ?pressure is 94.7 mmHg.  ? 3. Left atrial size was mildly dilated.  ? 4. Large pleural effusion in the left lateral region.  ? 5. The mitral valve is grossly normal. Mild mitral valve regurgitation.  ? 6. Tricuspid valve regurgitation is moderate.  ? 7. The [place] and the [place] are fused. . The aortic valve is tricuspid. Aortic  ?valve regurgitation is not visualized. Mild aortic valve stenosis.  ? 8. The inferior vena cava is dilated in size with <50% respiratory  ?variability, suggesting right atrial pressure of 15 mmHg. ?  ?Laboratory Data: ? ?Patient Profile  ?    ?54F with CAD s/p CABG, hypertension, post-operative  atrial fibrillation, HTN, chronic diastolic and heart failure and severe pulmonary HTN admitted with acute on chronic diastolic heart failure. ? ?Assessment & Plan  ?  ?# Acute on chronic diastolic heart failure:  ?# Pulmonary HTN: ?# Hypertension: ?Volume status stable.  UOP incompletely recorded 2/2 spilling [place] catheter.  She is on oral lasix and had negative balance.  BP is running low.  Will reduce amlodipine to '5mg'$ .  She was started on Entresto this admission.  Continue carvedilol.  Renal function is going up and she is euvolemic.  Will reduce lasix to daily.  She has severe pulmonary hypertension thought to be 2/2 L heart failure.  No plan for invasive work up 2/2 age.  Her Lasix dose has been reduced prior to admission.  She also noted that she had had more salty meals than usual.  We discussed the importance of limiting her sodium intake and to be careful about frozen dinners and soups.  She will weigh herself daily at home.  She is planning to go home with home health PT/OT.  The team is arranging ambulance transfer home.  ? ?# CAD s/p CABG:  ?# Hyperlipidemia: ?# Elevated troponin; ?Troponin elevated and this pattern of demand ischemia in the setting of volume overload.  No ischemic work-up indicated.  She has no chest pain.  Continue aspirin, statin, and carvedilol. ? ?# PAF:  ?Currently containing sinus rhythm with PACs and occasional dropped beats. ? ? [facility] will sign off.   ?Medication Recommendations:  reduce amlodipine to '5mg'$ .  Lasix '40mg'$  po daily ?Other recommendations (labs, testing, etc):  BMP in a week ?Follow up as an outpatient:  we will arrange ? ? ?For questions or updates, please contact [place] ?Please consult [email or web address] for contact info under  ? ?  ?   ?Signed, ?Skeet Latch, MD  ?07/13/2021, 9:26 AM   ? ?

## 2021-07-13 NOTE — Progress Notes (Signed)
Physical Therapy Treatment ?Patient Details ?Name: Marissa Jefferson ?MRN: [id number] ?DOB: January 24, 1925 ?Today's Date: 07/13/2021 ? ? ?History of Present Illness Pt is a 88 y/o female presenting on 4/30 with SOB.  Admitted with CHF. PMH includes: CHF with EF of 20-25%, DM2, breast CA, CAD, CVA, HTN, osteopenia. ? ?  ?PT Comments  ? ? Pt making steady progress with mobility. Requiring 4L of O2 with amb to keep SpO2 90% or greater. Planning to return home with transport services up the stairs into the home.    ?Recommendations for follow up therapy are one component of a multi-disciplinary discharge planning process, led by the attending physician.  Recommendations may be updated based on patient status, additional functional criteria and insurance authorization. ? ?Follow Up Recommendations ? Home health PT (transport services into home) ?  ?  ?Assistance Recommended at Discharge Frequent or constant Supervision/Assistance  ?Patient can return home with the following A little help with bathing/dressing/bathroom;Assistance with cooking/housework;Help with stairs or ramp for entrance;Assist for transportation;A little help with walking and/or transfers ?  ?Equipment Recommendations ? None recommended by PT  ?  ?Recommendations for Other Services   ? ? ?  ?Precautions / Restrictions Precautions ?Precautions: Fall ?Precaution Comments: watch O2, now on 4L wears 2L baseline ?Restrictions ?Weight Bearing Restrictions: No  ?  ? ?Mobility ? Bed Mobility ?Overal bed mobility: Modified Independent ?Bed Mobility: Supine to Sit ?  ?  ?Supine to sit: HOB elevated, Modified independent (Device/Increase time) ?  ?  ?  ?  ? ?Transfers ?Overall transfer level: Needs assistance ?Equipment used: Rollator (4 wheels) ?Transfers: Sit to/from Stand ?Sit to Stand: Min guard ?  ?  ?  ?  ?  ?General transfer comment: Verbal cues to scoot further foward on EOB prior to standing. Incr time to rise. ?  ? ?Ambulation/Gait ?Ambulation/Gait assistance: Min  guard ?Gait Distance (Feet): 125 Feet ?Assistive device: Rollator (4 wheels) ?Gait Pattern/deviations: Step-through pattern, Decreased stride length, Narrow base of support, Trunk flexed ?Gait velocity: Decreased ?Gait velocity interpretation: <1.8 ft/sec, indicate of risk for recurrent falls ?  ?General Gait Details: Assist for safety. No overt loss of balance. ? ? ?Stairs ?  ?  ?  ?  ?  ? ? ?Wheelchair Mobility ?  ? ?Modified Rankin (Stroke Patients Only) ?  ? ? ?  ?Balance Overall balance assessment: Needs assistance ?Sitting-balance support: No upper extremity supported, Feet supported ?Sitting balance-Leahy Scale: Fair ?  ?  ?Standing balance support: Single extremity supported, Bilateral upper extremity supported, During functional activity ?Standing balance-Leahy Scale: Poor ?Standing balance comment: Reliant on UE support ?  ?  ?  ?  ?  ?  ?  ?  ?  ?  ?  ?  ? ?  ?Cognition Arousal/Alertness: Awake/alert ?Behavior During Therapy: [facility] for tasks assessed/performed ?Overall Cognitive Status: Within Functional Limits for tasks assessed ?  ?  ?  ?  ?  ?  ?  ?  ?  ?  ?  ?  ?  ?  ?  ?  ?  ?  ?  ? ?  ?Exercises   ? ?  ?General Comments General comments (skin integrity, edema, etc.): Attempted to amb on 2L but SpO2 to 85%. Incr to 3L then 4L to keep SpO2 to 90% with amb ?  ?  ? ?Pertinent Vitals/Pain Pain Assessment ?Pain Assessment: No/denies pain  ? ? ?Home Living   ?  ?  ?  ?  ?  ?  ?  ?  ?  ?   ?  ?  Prior Function    ?  ?  ?   ? ?PT Goals (current goals can now be found in the care plan section) Acute Rehab PT Goals ?Patient Stated Goal: to get better ?Progress towards PT goals: Progressing toward goals ? ?  ?Frequency ? ? ? Min 3X/week ? ? ? ?  ?PT Plan Current plan remains appropriate  ? ? ?Co-evaluation   ?  ?  ?  ?  ? ?  ?AM-PAC PT "6 Clicks" Mobility   ?Outcome Measure ? Help needed turning from your back to your side while in a flat bed without using bedrails?: None ?Help needed moving from lying on your back  to sitting on the side of a flat bed without using bedrails?: None ?Help needed moving to and from a bed to a chair (including a wheelchair)?: A Little ?Help needed standing up from a chair using your arms (e.g., wheelchair or bedside chair)?: A Little ?Help needed to walk in hospital room?: A Little ?Help needed climbing 3-5 steps with a railing? : A Little ?6 Click Score: 20 ? ?  ?End of Session Equipment Utilized During Treatment: Oxygen;Gait belt ?Activity Tolerance: Patient tolerated treatment well ?Patient left: with call bell/phone within reach;in chair ?Nurse Communication: Mobility status ?PT Visit Diagnosis: Unsteadiness on feet (R26.81);Muscle weakness (generalized) (M62.81);Other abnormalities of gait and mobility (R26.89);Difficulty in walking, not elsewhere classified (R26.2) ?  ? ? ?Time: 0721-8288 ?PT Time Calculation (min) (ACUTE ONLY): 28 min ? ?Charges:  $Gait Training: 23-37 mins          ?          ? ?[facility] PT ?Acute Rehabilitation Services ?Office [phone] ? ? ? ?Shary Decamp [facility] ?07/13/2021, 10:53 AM ? ?

## 2021-07-13 NOTE — Plan of Care (Signed)
?  Problem: Education: ?Goal: Ability to demonstrate management of disease process will improve ?Outcome: Adequate for Discharge ?Goal: Ability to verbalize understanding of medication therapies will improve ?Outcome: Adequate for Discharge ?Goal: Individualized Educational Video(s) ?Outcome: Adequate for Discharge ?  ?Problem: Activity: ?Goal: Capacity to carry out activities will improve ?Outcome: Adequate for Discharge ?  ?Problem: Cardiac: ?Goal: Ability to achieve and maintain adequate cardiopulmonary perfusion will improve ?Outcome: Adequate for Discharge ?  ?Problem: Acute Rehab OT Goals (only OT should resolve) ?Goal: Pt. Will Perform Grooming ?Outcome: Adequate for Discharge ?Goal: Pt. Will Perform Lower Body Dressing ?Outcome: Adequate for Discharge ?Goal: Pt. Will Transfer To Toilet ?Outcome: Adequate for Discharge ?Goal: Pt. Will Perform Toileting-Clothing Manipulation ?Outcome: Adequate for Discharge ?Goal: Pt/Caregiver Will Perform Home Exercise Program ?Outcome: Adequate for Discharge ?Goal: OT Additional ADL Goal #1 ?Outcome: Adequate for Discharge ?  ?Problem: Acute Rehab PT Goals(only PT should resolve) ?Goal: Patient Will Transfer Sit To/From Stand ?Outcome: Adequate for Discharge ?Goal: Pt Will Ambulate ?Outcome: Adequate for Discharge ?Goal: Pt Will Go Up/Down Stairs ?Outcome: Adequate for Discharge ?  ?Problem: Education: ?Goal: Knowledge of General Education information will improve ?Description: Including pain rating scale, medication(s)/side effects and non-pharmacologic comfort measures ?Outcome: Adequate for Discharge ?  ?Problem: Health Behavior/Discharge Planning: ?Goal: Ability to manage health-related needs will improve ?Outcome: Adequate for Discharge ?  ?Problem: Clinical Measurements: ?Goal: Ability to maintain clinical measurements within normal limits will improve ?Outcome: Adequate for Discharge ?Goal: Will remain free from infection ?Outcome: Adequate for Discharge ?Goal:  Diagnostic test results will improve ?Outcome: Adequate for Discharge ?Goal: Respiratory complications will improve ?Outcome: Adequate for Discharge ?Goal: Cardiovascular complication will be avoided ?Outcome: Adequate for Discharge ?  ?Problem: Activity: ?Goal: Risk for activity intolerance will decrease ?Outcome: Adequate for Discharge ?  ?Problem: Nutrition: ?Goal: Adequate nutrition will be maintained ?Outcome: Adequate for Discharge ?  ?Problem: Coping: ?Goal: Level of anxiety will decrease ?Outcome: Adequate for Discharge ?  ?Problem: Elimination: ?Goal: Will not experience complications related to bowel motility ?Outcome: Adequate for Discharge ?Goal: Will not experience complications related to urinary retention ?Outcome: Adequate for Discharge ?  ?Problem: Pain Managment: ?Goal: General experience of comfort will improve ?Outcome: Adequate for Discharge ?  ?Problem: Safety: ?Goal: Ability to remain free from injury will improve ?Outcome: Adequate for Discharge ?  ?Problem: Skin Integrity: ?Goal: Risk for impaired skin integrity will decrease ?Outcome: Adequate for Discharge ?  ?Problem: Increased Nutrient Needs (NI-5.1) ?Goal: Food and/or nutrient delivery ?Description: Individualized approach for food/nutrient provision. ?Outcome: Adequate for Discharge ?  ?

## 2021-07-13 NOTE — TOC Progression Note (Signed)
Transition of Care (TOC) - Progression Note  ? ? ?Patient Details  ?Name: Marissa Jefferson ?MRN: [id number] ?Date of Birth: [birth year] ? ?Transition of Care (TOC) CM/SW Contact  ?Angelita Ingles, RN ?Phone Number:[phone] ? ?07/13/2021, 10:26 AM ? ?Clinical Narrative:    ?CM spoke with daughter to verify home O2. Patient currently has home O2 with [employer]. CM to fax updated order. Daughter will bring portable tank for discharge. Daughter is still undecided if she wants to use ambulance services for transport home. Daughter states she is still working on this and will call CM back in about an hour.  ? ? ?  ?Barriers to Discharge: Continued Medical Work up ? ?Expected Discharge Plan and Services ?  ?  ?  ?  ?  ?                ?  ?  ?  ?  ?  ?  ?  ?  ?  ?  ? ? ?Social Determinants of Health (SDOH) Interventions ?Food Insecurity Interventions: Intervention Not Indicated ?Housing Interventions: Intervention Not Indicated ?Transportation Interventions: Intervention Not Indicated ? ?Readmission Risk Interventions ?   ? View : No data to display.  ?  ?  ?  ? ? ?

## 2021-07-15 DIAGNOSIS — Z8673 Personal history of transient ischemic attack (TIA), and cerebral infarction without residual deficits: Secondary | ICD-10-CM | POA: Diagnosis not present

## 2021-07-15 DIAGNOSIS — I2510 Atherosclerotic heart disease of native coronary artery without angina pectoris: Secondary | ICD-10-CM | POA: Diagnosis not present

## 2021-07-15 DIAGNOSIS — Z9089 Acquired absence of other organs: Secondary | ICD-10-CM | POA: Diagnosis not present

## 2021-07-15 DIAGNOSIS — J9621 Acute and chronic respiratory failure with hypoxia: Secondary | ICD-10-CM | POA: Diagnosis not present

## 2021-07-15 DIAGNOSIS — N3281 Overactive bladder: Secondary | ICD-10-CM | POA: Diagnosis not present

## 2021-07-15 DIAGNOSIS — Z951 Presence of aortocoronary bypass graft: Secondary | ICD-10-CM | POA: Diagnosis not present

## 2021-07-15 DIAGNOSIS — Z9981 Dependence on supplemental oxygen: Secondary | ICD-10-CM | POA: Diagnosis not present

## 2021-07-15 DIAGNOSIS — I4729 Other ventricular tachycardia: Secondary | ICD-10-CM | POA: Diagnosis not present

## 2021-07-15 DIAGNOSIS — E039 Hypothyroidism, unspecified: Secondary | ICD-10-CM | POA: Diagnosis not present

## 2021-07-15 DIAGNOSIS — E119 Type 2 diabetes mellitus without complications: Secondary | ICD-10-CM | POA: Diagnosis not present

## 2021-07-15 DIAGNOSIS — I5042 Chronic combined systolic (congestive) and diastolic (congestive) heart failure: Secondary | ICD-10-CM | POA: Diagnosis not present

## 2021-07-15 DIAGNOSIS — K219 Gastro-esophageal reflux disease without esophagitis: Secondary | ICD-10-CM | POA: Diagnosis not present

## 2021-07-15 DIAGNOSIS — E7800 Pure hypercholesterolemia, unspecified: Secondary | ICD-10-CM | POA: Diagnosis not present

## 2021-07-15 DIAGNOSIS — Z853 Personal history of malignant neoplasm of breast: Secondary | ICD-10-CM | POA: Diagnosis not present

## 2021-07-15 DIAGNOSIS — I5032 Chronic diastolic (congestive) heart failure: Secondary | ICD-10-CM | POA: Diagnosis not present

## 2021-07-15 DIAGNOSIS — Z9181 History of falling: Secondary | ICD-10-CM | POA: Diagnosis not present

## 2021-07-15 DIAGNOSIS — M1990 Unspecified osteoarthritis, unspecified site: Secondary | ICD-10-CM | POA: Diagnosis not present

## 2021-07-15 DIAGNOSIS — Z7982 Long term (current) use of aspirin: Secondary | ICD-10-CM | POA: Diagnosis not present

## 2021-07-15 DIAGNOSIS — Z9841 Cataract extraction status, right eye: Secondary | ICD-10-CM | POA: Diagnosis not present

## 2021-07-15 DIAGNOSIS — Z9842 Cataract extraction status, left eye: Secondary | ICD-10-CM | POA: Diagnosis not present

## 2021-07-15 DIAGNOSIS — I110 Hypertensive heart disease with heart failure: Secondary | ICD-10-CM | POA: Diagnosis not present

## 2021-07-18 ENCOUNTER — Other Ambulatory Visit: Payer: Self-pay | Admitting: *Deleted

## 2021-07-18 DIAGNOSIS — I5020 Unspecified systolic (congestive) heart failure: Secondary | ICD-10-CM | POA: Diagnosis not present

## 2021-07-18 NOTE — Patient Outreach (Signed)
[facility]) Care Management ? ?07/18/2021 ? ?Marissa Jefferson ?1924/03/31 ?[id number] ? ?Telephone Assessment-Case closure ? ?RN spoke with daughter Webb Silversmith concerning an external services that has been noted as involved for case management services with her provider's office. Daughter is aware to contact the pt's provider for the requested services however daughter as contacted an agency for aide services wit ha forward deposit to confirm and initial aide services with [employer]. RN provided the Education officer, museum and case manager with the pt's provider's office for any additional needs.  ? ?This RN remains available for any direction for any other needs if she is unable to contact with her provider's office.  ? ?Case is closed via [facility] services at this time to due an another case management service involvement. ? ?Raina Mina, RN ?Care Management Coordinator ?[place] ?Main Office [phone]  ? ?

## 2021-07-19 DIAGNOSIS — I4729 Other ventricular tachycardia: Secondary | ICD-10-CM | POA: Diagnosis not present

## 2021-07-19 DIAGNOSIS — Z9842 Cataract extraction status, left eye: Secondary | ICD-10-CM | POA: Diagnosis not present

## 2021-07-19 DIAGNOSIS — Z7982 Long term (current) use of aspirin: Secondary | ICD-10-CM | POA: Diagnosis not present

## 2021-07-19 DIAGNOSIS — E039 Hypothyroidism, unspecified: Secondary | ICD-10-CM | POA: Diagnosis not present

## 2021-07-19 DIAGNOSIS — Z951 Presence of aortocoronary bypass graft: Secondary | ICD-10-CM | POA: Diagnosis not present

## 2021-07-19 DIAGNOSIS — Z853 Personal history of malignant neoplasm of breast: Secondary | ICD-10-CM | POA: Diagnosis not present

## 2021-07-19 DIAGNOSIS — I5042 Chronic combined systolic (congestive) and diastolic (congestive) heart failure: Secondary | ICD-10-CM | POA: Diagnosis not present

## 2021-07-19 DIAGNOSIS — Z9841 Cataract extraction status, right eye: Secondary | ICD-10-CM | POA: Diagnosis not present

## 2021-07-19 DIAGNOSIS — I110 Hypertensive heart disease with heart failure: Secondary | ICD-10-CM | POA: Diagnosis not present

## 2021-07-19 DIAGNOSIS — J9621 Acute and chronic respiratory failure with hypoxia: Secondary | ICD-10-CM | POA: Diagnosis not present

## 2021-07-19 DIAGNOSIS — M1990 Unspecified osteoarthritis, unspecified site: Secondary | ICD-10-CM | POA: Diagnosis not present

## 2021-07-19 DIAGNOSIS — Z9181 History of falling: Secondary | ICD-10-CM | POA: Diagnosis not present

## 2021-07-19 DIAGNOSIS — N3281 Overactive bladder: Secondary | ICD-10-CM | POA: Diagnosis not present

## 2021-07-19 DIAGNOSIS — Z9981 Dependence on supplemental oxygen: Secondary | ICD-10-CM | POA: Diagnosis not present

## 2021-07-19 DIAGNOSIS — E119 Type 2 diabetes mellitus without complications: Secondary | ICD-10-CM | POA: Diagnosis not present

## 2021-07-19 DIAGNOSIS — K219 Gastro-esophageal reflux disease without esophagitis: Secondary | ICD-10-CM | POA: Diagnosis not present

## 2021-07-19 DIAGNOSIS — E7800 Pure hypercholesterolemia, unspecified: Secondary | ICD-10-CM | POA: Diagnosis not present

## 2021-07-19 DIAGNOSIS — Z8673 Personal history of transient ischemic attack (TIA), and cerebral infarction without residual deficits: Secondary | ICD-10-CM | POA: Diagnosis not present

## 2021-07-19 DIAGNOSIS — I2510 Atherosclerotic heart disease of native coronary artery without angina pectoris: Secondary | ICD-10-CM | POA: Diagnosis not present

## 2021-07-19 DIAGNOSIS — Z9089 Acquired absence of other organs: Secondary | ICD-10-CM | POA: Diagnosis not present

## 2021-07-19 NOTE — Progress Notes (Signed)
?Cardiology Office Note:   ? ?Date:  07/20/2021  ? ?IDAmyla Jefferson, DOB 10-Nov-1924, MRN [id number] ? ?PCP:  Merrilee Seashore, MD ?[place] Cardiologist: Sanda Klein, MD  ? ?Reason for visit: Hospital follow-up ? ?History of Present Illness:   ? ?Marissa Jefferson is a 88 y.o. female with a hx of CAD s/p CABG 4196, chronic diastolic heart failure, postoperative atrial fibrillation without subsequent recurrence, essential hypertension, type 2 diabetes mellitus, mixed hyperlipidemia, history of left breast cancer with surgery and radiation. ? ?When she initially presented in 2015 she had severe left ventricular systolic dysfunction with an ejection fraction of 20-25% and episodes of nonsustained ventricular tachycardia.  Following surgery left ventricular systolic function completely recovered to an EF of 55%. ?  ?She was hospitalized in April 2229 with diastolic heart failure exacerbation.  Her echocardiogram again showed EF of 55 to 60%, with moderate LVH.  After diuretic adjustment, her dry weight was 120 pounds.  She was taking Lasix 10 mg daily. ? ?She was admitted to the hospital from April 30 to Jul 13, 2021 with shortness of breath, hypoxia and lower extremity edema.  She was diuresed with IV Lasix.  She was discharged on Lasix 40 mg daily.  Lisinopril changed to Entresto.  Amlodipine dose decreased. ? ?Today, she comes in with her daughter who is a Software engineer.  Her daughter explains that the patient only started diuretics 1 year ago with her first heart failure exacerbation.  She had been taking Lasix 20 mg daily and was on oxygen 2 L/min.  Her PCP had check labs and with bump in kidney function/concern for dehydration, he decreased her Lasix 20 mg every other day.  The week prior to admission, patient had a greater salt intake.  On the night prior to admission, the patient's oxygen tubing broke and therefore she went a night without her oxygen.  With these 3 things together, she was hypoxic, a low  bit more confused, bloated and was 2 pounds up.     ? ?After hospital discharge, her weight is back to her baseline which is now 118 pounds.  Her breathing is at her baseline.  She was discharged with oxygen at 4 L/min which she continues.  She denies chest pain.  Tolerating new medications well without presyncope. ? ?The patient's biggest concerns are (1) 20 steps she has to her house (2) wearing her oxygen which is cumbersome for her.  She denies history of COPD/asthma. ? ?  ?Past Medical History:  ?Diagnosis Date  ? Acute CHF (congestive heart failure) ([place]) 06/17/2020  ? Arthritis   ? "knees" (10/08/2013)  ? Breast cancer ([place])   ? "left; I had 62 sessions of radiation"  ? CAD (coronary artery disease), native coronary artery 10/06/2013  ? LAD 90%, CFX 90%, RCA 100% w/ collat  ? CHF (congestive heart failure) ([place]) 06/17/2020  ? Chronic combined systolic and diastolic CHF, NYHA class 2 ([place]) 09/2013  ? CVA (cerebral vascular accident) [facility]) 1968  ? leaving no deficit  ? DM2 (diabetes mellitus, type 2) ([place])   ? GERD (gastroesophageal reflux disease)   ? Hyperlipidemia   ? Hypertension   ? Hypothyroidism   ? Ischemic dilated cardiomyopathy ([place]) 09/2013  ? EF 20-25% by echo  ? NSVT (nonsustained ventricular tachycardia) ([place]) 09/2013  ? OAB (overactive bladder)   ? Osteopenia   ? 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 ? ?Past Surgical History:  ?Procedure Laterality Date  ?  BREAST LUMPECTOMY Left 1998  ? BREAST LUMPECTOMY WITH AXILLARY LYMPH NODE DISSECTION Left 1998  ? CARDIAC CATHETERIZATION  10/06/2013  ? CATARACT EXTRACTION W/ INTRAOCULAR LENS  IMPLANT, BILATERAL Bilateral ~ 2009  ? COLONOSCOPY  2005  ? CORONARY ARTERY BYPASS GRAFT [name] 10/09/2013  ? Procedure: CORONARY ARTERY BYPASS GRAFTING (CABG) x 4 using left internal mammary artery and right saphenous leg vein using endoscope.;  Surgeon: Melrose Nakayama, MD;  Location: [facility];  Service: Open Heart Surgery;   Laterality: [name];  ? INTRAOPERATIVE TRANSESOPHAGEAL ECHOCARDIOGRAM [name] 10/09/2013  ? Procedure: INTRAOPERATIVE TRANSESOPHAGEAL ECHOCARDIOGRAM;  Surgeon: Melrose Nakayama, MD;  Location: [facility];  Service: Open Heart Surgery;  Laterality: [name];  ? LEFT AND RIGHT HEART CATHETERIZATION WITH CORONARY ANGIOGRAM [name] 10/06/2013  ? Procedure: LEFT AND RIGHT HEART CATHETERIZATION WITH CORONARY ANGIOGRAM;  Surgeon: Sanda Klein, MD;  Location: [facility] CATH LAB;  Service: Cardiovascular;  Laterality: [name];  ? TONSILLECTOMY AND ADENOIDECTOMY  1930's  ? ? ?Current Medications: ?Current Meds  ?Medication Sig  ? acetaminophen (TYLENOL) 500 MG tablet Take 1,000 mg by mouth See admin instructions. Take 1,000 mg by mouth in the morning & at bedtime and an additional 500 mg once a day as needed for pain/discomfort  ? amLODipine (NORVASC) 5 MG tablet Take 1 tablet (5 mg total) by mouth daily.  ? aspirin EC 81 MG tablet Take 1 tablet (81 mg total) by mouth daily.  ? atorvastatin (LIPITOR) 80 MG tablet Take 80 mg by mouth daily.  ? B Complex-Biotin-FA (SUPER B-50 COMPLEX PO) Take 1 tablet by mouth daily before lunch.  ? Biotin 5000 MCG SUBL Place 5,000 mcg under the tongue daily.  ? carvedilol (COREG) 25 MG tablet TAKE 1 TABLET BY MOUTH TWICE A DAY WITH MEALS (Patient taking differently: Take 25 mg by mouth 2 (two) times daily with a meal.)  ? Cholecalciferol (VITAMIN D3) 125 MCG (5000 UT) CAPS Take 5,000 Units by mouth daily before lunch.  ? Cyanocobalamin (VITAMIN B-12) 5000 MCG SUBL Place 5,000 mcg under the tongue daily before lunch.  ? denosumab (PROLIA) 60 MG/ML SOSY injection Inject 60 mg into the skin every 6 (six) months.  ? docusate sodium (COLACE) 100 MG capsule Take 100 mg by mouth 2 (two) times daily.  ? furosemide (LASIX) 20 MG tablet Take 20 mg daily, add an additional 20 mg of days of weight gain over 120 lb or leg swelling  ? levothyroxine (SYNTHROID, LEVOTHROID) 150 MCG tablet Take 150 mcg by mouth daily before breakfast.  ?  magnesium gluconate (MAGONATE) 500 MG tablet Take 500 mg by mouth every evening.  ? metFORMIN (GLUCOPHAGE) 500 MG tablet Take 1,000 mg by mouth 2 (two) times daily.  ? OXYGEN Inhale 2-3 L into the lungs continuous.  ? potassium chloride (KLOR-CON M) 10 MEQ tablet Take 10 mEq by mouth daily.  ? protein supplement shake (PREMIER PROTEIN) LIQD Take 237 mLs by mouth in the morning.  ? sacubitril-valsartan (ENTRESTO) 24-26 MG Take 1 tablet by mouth 2 (two) times daily.  ? solifenacin (VESICARE) 10 MG tablet Take 10 mg by mouth at bedtime.  ? VOLTAREN 1 % GEL Apply 2 g topically 2 (two) times daily as needed (for bilateral knee pain).  ? [DISCONTINUED] furosemide (LASIX) 40 MG tablet Take 1 tablet (40 mg total) by mouth daily.  ?  ? ?Allergies:   Amiodarone, Crestor [rosuvastatin], and Sitagliptin  ? ?Social History  ? ?Socioeconomic History  ? Marital status: Widowed  ?  Spouse  name: Not on file  ? Number of children: 2  ? Years of education: Not on file  ? Highest education level: High school graduate  ?Occupational History  ? Occupation: [employer]  ?Tobacco Use  ? Smoking status: Former  ?  Packs/day: 0.25  ?  Years: 3.00  ?  Pack years: 0.75  ?  Types: Cigarettes  ?  Quit date: 03/12/1948  ?  Years since quitting: 73.4  ? Smokeless tobacco: Never  ? Tobacco comments:  ?  smoked in college   ?Vaping Use  ? Vaping Use: Never used  ?Substance and Sexual Activity  ? Alcohol use: No  ? Drug use: No  ? Sexual activity: Not Currently  ?Other Topics Concern  ? Not on file  ?Social History Narrative  ? Not on file  ? ?Social Determinants of Health  ? ?Financial Resource Strain: Not on file  ?Food Insecurity: No Food Insecurity  ? Worried About Charity fundraiser in the Last Year: Never true  ? Ran Out of Food in the Last Year: Never true  ?Transportation Needs: No Transportation Needs  ? Lack of Transportation (Medical): No  ? Lack of Transportation (Non-Medical): No  ?Physical Activity: Not on file  ?Stress: Not on file   ?Social Connections: Not on file  ?  ? ?Family History: ?The patient's family history includes Asthma in her daughter and son; Cancer in her father; Colon cancer in her mother; Heart attack in her father. ? ?R

## 2021-07-20 ENCOUNTER — Ambulatory Visit: Payer: Medicare HMO | Admitting: Physician Assistant

## 2021-07-20 ENCOUNTER — Encounter: Payer: Self-pay | Admitting: Physician Assistant

## 2021-07-20 VITALS — BP 120/60 | HR 60 | Resp 20 | Ht 66.0 in | Wt 122.8 lb

## 2021-07-20 DIAGNOSIS — I10 Essential (primary) hypertension: Secondary | ICD-10-CM | POA: Diagnosis not present

## 2021-07-20 DIAGNOSIS — R0902 Hypoxemia: Secondary | ICD-10-CM | POA: Diagnosis not present

## 2021-07-20 DIAGNOSIS — I5032 Chronic diastolic (congestive) heart failure: Secondary | ICD-10-CM | POA: Diagnosis not present

## 2021-07-20 DIAGNOSIS — I2510 Atherosclerotic heart disease of native coronary artery without angina pectoris: Secondary | ICD-10-CM | POA: Diagnosis not present

## 2021-07-20 DIAGNOSIS — E785 Hyperlipidemia, unspecified: Secondary | ICD-10-CM

## 2021-07-20 MED ORDER — FUROSEMIDE 20 MG PO TABS: ORAL_TABLET | ORAL | 3 refills | Status: AC

## 2021-07-20 NOTE — Patient Instructions (Addendum)
Medication Instructions:  ?Decrease Lasix 20 mg daily, add an additional 20 mg on he days of weight gain 120 lb or leg swelling.  ? ?*If you need a refill on your cardiac medications before your next appointment, please call your pharmacy* ? ? ?Lab Work: ?Your physician recommends that you complete labs today ?BMET ?B-Nat  ? ?If you have labs (blood work) drawn today and your tests are completely normal, you will receive your results only by: ?MyChart Message (if you have MyChart) OR ?A paper copy in the mail ?If you have any lab test that is abnormal or we need to change your treatment, we will call you to review the results. ? ? ?Testing/Procedures: ?NONE ordered at this time of appointment  ? ? ?Follow-Up: ?At [facility], you and your health needs are our priority.  As part of our continuing mission to provide you with exceptional heart care, we have created designated Provider Care Teams.  These Care Teams include your primary Cardiologist (physician) and Advanced Practice Providers (APPs -  Physician Assistants and Nurse Practitioners) who all work together to provide you with the care you need, when you need it. ? ?We recommend signing up for the patient portal called "MyChart".  Sign up information is provided on this After Visit Summary.  MyChart is used to connect with patients for Virtual Visits (Telemedicine).  Patients are able to view lab/test results, encounter notes, upcoming appointments, etc.  Non-urgent messages can be sent to your provider as well.   ?To learn more about what you can do with MyChart, go to [email or web address].   ? ?Your next appointment:   ?2-3 month(s) ? ?The format for your next appointment:   ?In Person ? ?Provider:   ?Sanda Klein, MD   ? ? ?Other Instructions ?Referral sent to Pulmonology  ?Important Information About Sugar ? ? ? ? ? ?   ?

## 2021-07-21 DIAGNOSIS — Z9981 Dependence on supplemental oxygen: Secondary | ICD-10-CM | POA: Diagnosis not present

## 2021-07-21 DIAGNOSIS — I110 Hypertensive heart disease with heart failure: Secondary | ICD-10-CM | POA: Diagnosis not present

## 2021-07-21 DIAGNOSIS — Z7982 Long term (current) use of aspirin: Secondary | ICD-10-CM | POA: Diagnosis not present

## 2021-07-21 DIAGNOSIS — J9621 Acute and chronic respiratory failure with hypoxia: Secondary | ICD-10-CM | POA: Diagnosis not present

## 2021-07-21 DIAGNOSIS — Z951 Presence of aortocoronary bypass graft: Secondary | ICD-10-CM | POA: Diagnosis not present

## 2021-07-21 DIAGNOSIS — Z9842 Cataract extraction status, left eye: Secondary | ICD-10-CM | POA: Diagnosis not present

## 2021-07-21 DIAGNOSIS — Z9181 History of falling: Secondary | ICD-10-CM | POA: Diagnosis not present

## 2021-07-21 DIAGNOSIS — E119 Type 2 diabetes mellitus without complications: Secondary | ICD-10-CM | POA: Diagnosis not present

## 2021-07-21 DIAGNOSIS — I5042 Chronic combined systolic (congestive) and diastolic (congestive) heart failure: Secondary | ICD-10-CM | POA: Diagnosis not present

## 2021-07-21 DIAGNOSIS — E7800 Pure hypercholesterolemia, unspecified: Secondary | ICD-10-CM | POA: Diagnosis not present

## 2021-07-21 DIAGNOSIS — K219 Gastro-esophageal reflux disease without esophagitis: Secondary | ICD-10-CM | POA: Diagnosis not present

## 2021-07-21 DIAGNOSIS — Z8673 Personal history of transient ischemic attack (TIA), and cerebral infarction without residual deficits: Secondary | ICD-10-CM | POA: Diagnosis not present

## 2021-07-21 DIAGNOSIS — Z853 Personal history of malignant neoplasm of breast: Secondary | ICD-10-CM | POA: Diagnosis not present

## 2021-07-21 DIAGNOSIS — Z9089 Acquired absence of other organs: Secondary | ICD-10-CM | POA: Diagnosis not present

## 2021-07-21 DIAGNOSIS — N3281 Overactive bladder: Secondary | ICD-10-CM | POA: Diagnosis not present

## 2021-07-21 DIAGNOSIS — E039 Hypothyroidism, unspecified: Secondary | ICD-10-CM | POA: Diagnosis not present

## 2021-07-21 DIAGNOSIS — M1990 Unspecified osteoarthritis, unspecified site: Secondary | ICD-10-CM | POA: Diagnosis not present

## 2021-07-21 DIAGNOSIS — I4729 Other ventricular tachycardia: Secondary | ICD-10-CM | POA: Diagnosis not present

## 2021-07-21 DIAGNOSIS — I2510 Atherosclerotic heart disease of native coronary artery without angina pectoris: Secondary | ICD-10-CM | POA: Diagnosis not present

## 2021-07-21 DIAGNOSIS — Z9841 Cataract extraction status, right eye: Secondary | ICD-10-CM | POA: Diagnosis not present

## 2021-07-21 LAB — BASIC METABOLIC PANEL
BUN/Creatinine Ratio: 66 — ABNORMAL HIGH (ref 12–28)
BUN: 47 mg/dL — ABNORMAL HIGH (ref 10–36)
CO2: 24 mmol/L (ref 20–29)
Calcium: 11 mg/dL — ABNORMAL HIGH (ref 8.7–10.3)
Chloride: 97 mmol/L (ref 96–106)
Creatinine, Ser: 0.71 mg/dL (ref 0.57–1.00)
Glucose: 190 mg/dL — ABNORMAL HIGH (ref 70–99)
Potassium: 4.9 mmol/L (ref 3.5–5.2)
Sodium: 141 mmol/L (ref 134–144)
eGFR: 79 mL/min/{1.73_m2} (ref >59)

## 2021-07-21 LAB — BRAIN NATRIURETIC PEPTIDE: BNP: 756 pg/mL — ABNORMAL HIGH (ref 0.0–100.0)

## 2021-07-24 DIAGNOSIS — E119 Type 2 diabetes mellitus without complications: Secondary | ICD-10-CM | POA: Diagnosis not present

## 2021-07-24 DIAGNOSIS — M1990 Unspecified osteoarthritis, unspecified site: Secondary | ICD-10-CM | POA: Diagnosis not present

## 2021-07-24 DIAGNOSIS — I110 Hypertensive heart disease with heart failure: Secondary | ICD-10-CM | POA: Diagnosis not present

## 2021-07-24 DIAGNOSIS — Z9841 Cataract extraction status, right eye: Secondary | ICD-10-CM | POA: Diagnosis not present

## 2021-07-24 DIAGNOSIS — Z9181 History of falling: Secondary | ICD-10-CM | POA: Diagnosis not present

## 2021-07-24 DIAGNOSIS — I5042 Chronic combined systolic (congestive) and diastolic (congestive) heart failure: Secondary | ICD-10-CM | POA: Diagnosis not present

## 2021-07-24 DIAGNOSIS — N3281 Overactive bladder: Secondary | ICD-10-CM | POA: Diagnosis not present

## 2021-07-24 DIAGNOSIS — Z7982 Long term (current) use of aspirin: Secondary | ICD-10-CM | POA: Diagnosis not present

## 2021-07-24 DIAGNOSIS — K219 Gastro-esophageal reflux disease without esophagitis: Secondary | ICD-10-CM | POA: Diagnosis not present

## 2021-07-24 DIAGNOSIS — Z8673 Personal history of transient ischemic attack (TIA), and cerebral infarction without residual deficits: Secondary | ICD-10-CM | POA: Diagnosis not present

## 2021-07-24 DIAGNOSIS — E039 Hypothyroidism, unspecified: Secondary | ICD-10-CM | POA: Diagnosis not present

## 2021-07-24 DIAGNOSIS — I2510 Atherosclerotic heart disease of native coronary artery without angina pectoris: Secondary | ICD-10-CM | POA: Diagnosis not present

## 2021-07-24 DIAGNOSIS — Z951 Presence of aortocoronary bypass graft: Secondary | ICD-10-CM | POA: Diagnosis not present

## 2021-07-24 DIAGNOSIS — E7800 Pure hypercholesterolemia, unspecified: Secondary | ICD-10-CM | POA: Diagnosis not present

## 2021-07-24 DIAGNOSIS — Z9981 Dependence on supplemental oxygen: Secondary | ICD-10-CM | POA: Diagnosis not present

## 2021-07-24 DIAGNOSIS — I4729 Other ventricular tachycardia: Secondary | ICD-10-CM | POA: Diagnosis not present

## 2021-07-24 DIAGNOSIS — Z853 Personal history of malignant neoplasm of breast: Secondary | ICD-10-CM | POA: Diagnosis not present

## 2021-07-24 DIAGNOSIS — J9621 Acute and chronic respiratory failure with hypoxia: Secondary | ICD-10-CM | POA: Diagnosis not present

## 2021-07-24 DIAGNOSIS — Z9842 Cataract extraction status, left eye: Secondary | ICD-10-CM | POA: Diagnosis not present

## 2021-07-24 DIAGNOSIS — Z9089 Acquired absence of other organs: Secondary | ICD-10-CM | POA: Diagnosis not present

## 2021-07-25 ENCOUNTER — Telehealth: Payer: Self-pay

## 2021-07-25 DIAGNOSIS — I2510 Atherosclerotic heart disease of native coronary artery without angina pectoris: Secondary | ICD-10-CM | POA: Diagnosis not present

## 2021-07-25 DIAGNOSIS — J9621 Acute and chronic respiratory failure with hypoxia: Secondary | ICD-10-CM | POA: Diagnosis not present

## 2021-07-25 DIAGNOSIS — Z9981 Dependence on supplemental oxygen: Secondary | ICD-10-CM | POA: Diagnosis not present

## 2021-07-25 DIAGNOSIS — E039 Hypothyroidism, unspecified: Secondary | ICD-10-CM | POA: Diagnosis not present

## 2021-07-25 DIAGNOSIS — I110 Hypertensive heart disease with heart failure: Secondary | ICD-10-CM | POA: Diagnosis not present

## 2021-07-25 DIAGNOSIS — Z9181 History of falling: Secondary | ICD-10-CM | POA: Diagnosis not present

## 2021-07-25 DIAGNOSIS — Z9841 Cataract extraction status, right eye: Secondary | ICD-10-CM | POA: Diagnosis not present

## 2021-07-25 DIAGNOSIS — Z9089 Acquired absence of other organs: Secondary | ICD-10-CM | POA: Diagnosis not present

## 2021-07-25 DIAGNOSIS — K219 Gastro-esophageal reflux disease without esophagitis: Secondary | ICD-10-CM | POA: Diagnosis not present

## 2021-07-25 DIAGNOSIS — I5042 Chronic combined systolic (congestive) and diastolic (congestive) heart failure: Secondary | ICD-10-CM | POA: Diagnosis not present

## 2021-07-25 DIAGNOSIS — E119 Type 2 diabetes mellitus without complications: Secondary | ICD-10-CM | POA: Diagnosis not present

## 2021-07-25 DIAGNOSIS — Z853 Personal history of malignant neoplasm of breast: Secondary | ICD-10-CM | POA: Diagnosis not present

## 2021-07-25 DIAGNOSIS — E7800 Pure hypercholesterolemia, unspecified: Secondary | ICD-10-CM | POA: Diagnosis not present

## 2021-07-25 DIAGNOSIS — Z8673 Personal history of transient ischemic attack (TIA), and cerebral infarction without residual deficits: Secondary | ICD-10-CM | POA: Diagnosis not present

## 2021-07-25 DIAGNOSIS — I4729 Other ventricular tachycardia: Secondary | ICD-10-CM | POA: Diagnosis not present

## 2021-07-25 DIAGNOSIS — Z951 Presence of aortocoronary bypass graft: Secondary | ICD-10-CM | POA: Diagnosis not present

## 2021-07-25 DIAGNOSIS — N3281 Overactive bladder: Secondary | ICD-10-CM | POA: Diagnosis not present

## 2021-07-25 DIAGNOSIS — Z9842 Cataract extraction status, left eye: Secondary | ICD-10-CM | POA: Diagnosis not present

## 2021-07-25 DIAGNOSIS — M1990 Unspecified osteoarthritis, unspecified site: Secondary | ICD-10-CM | POA: Diagnosis not present

## 2021-07-25 DIAGNOSIS — Z7982 Long term (current) use of aspirin: Secondary | ICD-10-CM | POA: Diagnosis not present

## 2021-07-25 NOTE — Telephone Encounter (Addendum)
Called patient regarding results. Patient had understanding of results.----- Message from Warren Lacy, PA-C sent at 07/23/2021  8:53 PM EDT ----- ?BNP 756 at estimated dry weight. ?Kidney function back to normal.  Electrolytes normal. ?

## 2021-07-26 DIAGNOSIS — Z853 Personal history of malignant neoplasm of breast: Secondary | ICD-10-CM | POA: Diagnosis not present

## 2021-07-26 DIAGNOSIS — Z8673 Personal history of transient ischemic attack (TIA), and cerebral infarction without residual deficits: Secondary | ICD-10-CM | POA: Diagnosis not present

## 2021-07-26 DIAGNOSIS — Z9841 Cataract extraction status, right eye: Secondary | ICD-10-CM | POA: Diagnosis not present

## 2021-07-26 DIAGNOSIS — I4729 Other ventricular tachycardia: Secondary | ICD-10-CM | POA: Diagnosis not present

## 2021-07-26 DIAGNOSIS — Z9089 Acquired absence of other organs: Secondary | ICD-10-CM | POA: Diagnosis not present

## 2021-07-26 DIAGNOSIS — Z7982 Long term (current) use of aspirin: Secondary | ICD-10-CM | POA: Diagnosis not present

## 2021-07-26 DIAGNOSIS — I110 Hypertensive heart disease with heart failure: Secondary | ICD-10-CM | POA: Diagnosis not present

## 2021-07-26 DIAGNOSIS — E119 Type 2 diabetes mellitus without complications: Secondary | ICD-10-CM | POA: Diagnosis not present

## 2021-07-26 DIAGNOSIS — I2510 Atherosclerotic heart disease of native coronary artery without angina pectoris: Secondary | ICD-10-CM | POA: Diagnosis not present

## 2021-07-26 DIAGNOSIS — Z9842 Cataract extraction status, left eye: Secondary | ICD-10-CM | POA: Diagnosis not present

## 2021-07-26 DIAGNOSIS — Z9181 History of falling: Secondary | ICD-10-CM | POA: Diagnosis not present

## 2021-07-26 DIAGNOSIS — K219 Gastro-esophageal reflux disease without esophagitis: Secondary | ICD-10-CM | POA: Diagnosis not present

## 2021-07-26 DIAGNOSIS — J9621 Acute and chronic respiratory failure with hypoxia: Secondary | ICD-10-CM | POA: Diagnosis not present

## 2021-07-26 DIAGNOSIS — M1990 Unspecified osteoarthritis, unspecified site: Secondary | ICD-10-CM | POA: Diagnosis not present

## 2021-07-26 DIAGNOSIS — Z9981 Dependence on supplemental oxygen: Secondary | ICD-10-CM | POA: Diagnosis not present

## 2021-07-26 DIAGNOSIS — Z951 Presence of aortocoronary bypass graft: Secondary | ICD-10-CM | POA: Diagnosis not present

## 2021-07-26 DIAGNOSIS — N3281 Overactive bladder: Secondary | ICD-10-CM | POA: Diagnosis not present

## 2021-07-26 DIAGNOSIS — E7800 Pure hypercholesterolemia, unspecified: Secondary | ICD-10-CM | POA: Diagnosis not present

## 2021-07-26 DIAGNOSIS — E039 Hypothyroidism, unspecified: Secondary | ICD-10-CM | POA: Diagnosis not present

## 2021-07-26 DIAGNOSIS — I5042 Chronic combined systolic (congestive) and diastolic (congestive) heart failure: Secondary | ICD-10-CM | POA: Diagnosis not present

## 2021-07-31 ENCOUNTER — Other Ambulatory Visit (HOSPITAL_COMMUNITY): Payer: Self-pay

## 2021-07-31 DIAGNOSIS — Z9181 History of falling: Secondary | ICD-10-CM | POA: Diagnosis not present

## 2021-07-31 DIAGNOSIS — Z9842 Cataract extraction status, left eye: Secondary | ICD-10-CM | POA: Diagnosis not present

## 2021-07-31 DIAGNOSIS — Z8673 Personal history of transient ischemic attack (TIA), and cerebral infarction without residual deficits: Secondary | ICD-10-CM | POA: Diagnosis not present

## 2021-07-31 DIAGNOSIS — K219 Gastro-esophageal reflux disease without esophagitis: Secondary | ICD-10-CM | POA: Diagnosis not present

## 2021-07-31 DIAGNOSIS — Z9841 Cataract extraction status, right eye: Secondary | ICD-10-CM | POA: Diagnosis not present

## 2021-07-31 DIAGNOSIS — I110 Hypertensive heart disease with heart failure: Secondary | ICD-10-CM | POA: Diagnosis not present

## 2021-07-31 DIAGNOSIS — N3281 Overactive bladder: Secondary | ICD-10-CM | POA: Diagnosis not present

## 2021-07-31 DIAGNOSIS — I2510 Atherosclerotic heart disease of native coronary artery without angina pectoris: Secondary | ICD-10-CM | POA: Diagnosis not present

## 2021-07-31 DIAGNOSIS — Z9089 Acquired absence of other organs: Secondary | ICD-10-CM | POA: Diagnosis not present

## 2021-07-31 DIAGNOSIS — I5042 Chronic combined systolic (congestive) and diastolic (congestive) heart failure: Secondary | ICD-10-CM | POA: Diagnosis not present

## 2021-07-31 DIAGNOSIS — J9621 Acute and chronic respiratory failure with hypoxia: Secondary | ICD-10-CM | POA: Diagnosis not present

## 2021-07-31 DIAGNOSIS — Z853 Personal history of malignant neoplasm of breast: Secondary | ICD-10-CM | POA: Diagnosis not present

## 2021-07-31 DIAGNOSIS — Z9981 Dependence on supplemental oxygen: Secondary | ICD-10-CM | POA: Diagnosis not present

## 2021-07-31 DIAGNOSIS — Z951 Presence of aortocoronary bypass graft: Secondary | ICD-10-CM | POA: Diagnosis not present

## 2021-07-31 DIAGNOSIS — Z7982 Long term (current) use of aspirin: Secondary | ICD-10-CM | POA: Diagnosis not present

## 2021-07-31 DIAGNOSIS — M1990 Unspecified osteoarthritis, unspecified site: Secondary | ICD-10-CM | POA: Diagnosis not present

## 2021-07-31 DIAGNOSIS — I4729 Other ventricular tachycardia: Secondary | ICD-10-CM | POA: Diagnosis not present

## 2021-07-31 DIAGNOSIS — E119 Type 2 diabetes mellitus without complications: Secondary | ICD-10-CM | POA: Diagnosis not present

## 2021-07-31 DIAGNOSIS — E039 Hypothyroidism, unspecified: Secondary | ICD-10-CM | POA: Diagnosis not present

## 2021-07-31 DIAGNOSIS — E7800 Pure hypercholesterolemia, unspecified: Secondary | ICD-10-CM | POA: Diagnosis not present

## 2021-08-01 ENCOUNTER — Encounter: Payer: Self-pay | Admitting: *Deleted

## 2021-08-01 NOTE — Telephone Encounter (Signed)
This encounter was created in error - please disregard.

## 2021-08-02 DIAGNOSIS — Z9842 Cataract extraction status, left eye: Secondary | ICD-10-CM | POA: Diagnosis not present

## 2021-08-02 DIAGNOSIS — E7800 Pure hypercholesterolemia, unspecified: Secondary | ICD-10-CM | POA: Diagnosis not present

## 2021-08-02 DIAGNOSIS — Z853 Personal history of malignant neoplasm of breast: Secondary | ICD-10-CM | POA: Diagnosis not present

## 2021-08-02 DIAGNOSIS — Z9181 History of falling: Secondary | ICD-10-CM | POA: Diagnosis not present

## 2021-08-02 DIAGNOSIS — I4729 Other ventricular tachycardia: Secondary | ICD-10-CM | POA: Diagnosis not present

## 2021-08-02 DIAGNOSIS — M1990 Unspecified osteoarthritis, unspecified site: Secondary | ICD-10-CM | POA: Diagnosis not present

## 2021-08-02 DIAGNOSIS — I110 Hypertensive heart disease with heart failure: Secondary | ICD-10-CM | POA: Diagnosis not present

## 2021-08-02 DIAGNOSIS — J9621 Acute and chronic respiratory failure with hypoxia: Secondary | ICD-10-CM | POA: Diagnosis not present

## 2021-08-02 DIAGNOSIS — N3281 Overactive bladder: Secondary | ICD-10-CM | POA: Diagnosis not present

## 2021-08-02 DIAGNOSIS — Z7982 Long term (current) use of aspirin: Secondary | ICD-10-CM | POA: Diagnosis not present

## 2021-08-02 DIAGNOSIS — E039 Hypothyroidism, unspecified: Secondary | ICD-10-CM | POA: Diagnosis not present

## 2021-08-02 DIAGNOSIS — I5042 Chronic combined systolic (congestive) and diastolic (congestive) heart failure: Secondary | ICD-10-CM | POA: Diagnosis not present

## 2021-08-02 DIAGNOSIS — Z9089 Acquired absence of other organs: Secondary | ICD-10-CM | POA: Diagnosis not present

## 2021-08-02 DIAGNOSIS — E119 Type 2 diabetes mellitus without complications: Secondary | ICD-10-CM | POA: Diagnosis not present

## 2021-08-02 DIAGNOSIS — K219 Gastro-esophageal reflux disease without esophagitis: Secondary | ICD-10-CM | POA: Diagnosis not present

## 2021-08-02 DIAGNOSIS — I2510 Atherosclerotic heart disease of native coronary artery without angina pectoris: Secondary | ICD-10-CM | POA: Diagnosis not present

## 2021-08-02 DIAGNOSIS — Z951 Presence of aortocoronary bypass graft: Secondary | ICD-10-CM | POA: Diagnosis not present

## 2021-08-02 DIAGNOSIS — Z9981 Dependence on supplemental oxygen: Secondary | ICD-10-CM | POA: Diagnosis not present

## 2021-08-02 DIAGNOSIS — Z8673 Personal history of transient ischemic attack (TIA), and cerebral infarction without residual deficits: Secondary | ICD-10-CM | POA: Diagnosis not present

## 2021-08-02 DIAGNOSIS — Z9841 Cataract extraction status, right eye: Secondary | ICD-10-CM | POA: Diagnosis not present

## 2021-08-04 DIAGNOSIS — I4729 Other ventricular tachycardia: Secondary | ICD-10-CM | POA: Diagnosis not present

## 2021-08-04 DIAGNOSIS — M1990 Unspecified osteoarthritis, unspecified site: Secondary | ICD-10-CM | POA: Diagnosis not present

## 2021-08-04 DIAGNOSIS — I2510 Atherosclerotic heart disease of native coronary artery without angina pectoris: Secondary | ICD-10-CM | POA: Diagnosis not present

## 2021-08-04 DIAGNOSIS — Z9181 History of falling: Secondary | ICD-10-CM | POA: Diagnosis not present

## 2021-08-04 DIAGNOSIS — J9621 Acute and chronic respiratory failure with hypoxia: Secondary | ICD-10-CM | POA: Diagnosis not present

## 2021-08-04 DIAGNOSIS — Z9089 Acquired absence of other organs: Secondary | ICD-10-CM | POA: Diagnosis not present

## 2021-08-04 DIAGNOSIS — N3281 Overactive bladder: Secondary | ICD-10-CM | POA: Diagnosis not present

## 2021-08-04 DIAGNOSIS — I5042 Chronic combined systolic (congestive) and diastolic (congestive) heart failure: Secondary | ICD-10-CM | POA: Diagnosis not present

## 2021-08-04 DIAGNOSIS — Z9981 Dependence on supplemental oxygen: Secondary | ICD-10-CM | POA: Diagnosis not present

## 2021-08-04 DIAGNOSIS — Z8673 Personal history of transient ischemic attack (TIA), and cerebral infarction without residual deficits: Secondary | ICD-10-CM | POA: Diagnosis not present

## 2021-08-04 DIAGNOSIS — E039 Hypothyroidism, unspecified: Secondary | ICD-10-CM | POA: Diagnosis not present

## 2021-08-04 DIAGNOSIS — Z7982 Long term (current) use of aspirin: Secondary | ICD-10-CM | POA: Diagnosis not present

## 2021-08-04 DIAGNOSIS — E119 Type 2 diabetes mellitus without complications: Secondary | ICD-10-CM | POA: Diagnosis not present

## 2021-08-04 DIAGNOSIS — Z9842 Cataract extraction status, left eye: Secondary | ICD-10-CM | POA: Diagnosis not present

## 2021-08-04 DIAGNOSIS — E7800 Pure hypercholesterolemia, unspecified: Secondary | ICD-10-CM | POA: Diagnosis not present

## 2021-08-04 DIAGNOSIS — I110 Hypertensive heart disease with heart failure: Secondary | ICD-10-CM | POA: Diagnosis not present

## 2021-08-04 DIAGNOSIS — Z853 Personal history of malignant neoplasm of breast: Secondary | ICD-10-CM | POA: Diagnosis not present

## 2021-08-04 DIAGNOSIS — K219 Gastro-esophageal reflux disease without esophagitis: Secondary | ICD-10-CM | POA: Diagnosis not present

## 2021-08-04 DIAGNOSIS — Z9841 Cataract extraction status, right eye: Secondary | ICD-10-CM | POA: Diagnosis not present

## 2021-08-04 DIAGNOSIS — Z951 Presence of aortocoronary bypass graft: Secondary | ICD-10-CM | POA: Diagnosis not present

## 2021-08-07 DIAGNOSIS — Z9089 Acquired absence of other organs: Secondary | ICD-10-CM | POA: Diagnosis not present

## 2021-08-07 DIAGNOSIS — I110 Hypertensive heart disease with heart failure: Secondary | ICD-10-CM | POA: Diagnosis not present

## 2021-08-07 DIAGNOSIS — Z8673 Personal history of transient ischemic attack (TIA), and cerebral infarction without residual deficits: Secondary | ICD-10-CM | POA: Diagnosis not present

## 2021-08-07 DIAGNOSIS — M1990 Unspecified osteoarthritis, unspecified site: Secondary | ICD-10-CM | POA: Diagnosis not present

## 2021-08-07 DIAGNOSIS — I2510 Atherosclerotic heart disease of native coronary artery without angina pectoris: Secondary | ICD-10-CM | POA: Diagnosis not present

## 2021-08-07 DIAGNOSIS — Z9842 Cataract extraction status, left eye: Secondary | ICD-10-CM | POA: Diagnosis not present

## 2021-08-07 DIAGNOSIS — E039 Hypothyroidism, unspecified: Secondary | ICD-10-CM | POA: Diagnosis not present

## 2021-08-07 DIAGNOSIS — Z9181 History of falling: Secondary | ICD-10-CM | POA: Diagnosis not present

## 2021-08-07 DIAGNOSIS — E119 Type 2 diabetes mellitus without complications: Secondary | ICD-10-CM | POA: Diagnosis not present

## 2021-08-07 DIAGNOSIS — Z951 Presence of aortocoronary bypass graft: Secondary | ICD-10-CM | POA: Diagnosis not present

## 2021-08-07 DIAGNOSIS — Z7982 Long term (current) use of aspirin: Secondary | ICD-10-CM | POA: Diagnosis not present

## 2021-08-07 DIAGNOSIS — K219 Gastro-esophageal reflux disease without esophagitis: Secondary | ICD-10-CM | POA: Diagnosis not present

## 2021-08-07 DIAGNOSIS — J9621 Acute and chronic respiratory failure with hypoxia: Secondary | ICD-10-CM | POA: Diagnosis not present

## 2021-08-07 DIAGNOSIS — I4729 Other ventricular tachycardia: Secondary | ICD-10-CM | POA: Diagnosis not present

## 2021-08-07 DIAGNOSIS — Z9981 Dependence on supplemental oxygen: Secondary | ICD-10-CM | POA: Diagnosis not present

## 2021-08-07 DIAGNOSIS — E7800 Pure hypercholesterolemia, unspecified: Secondary | ICD-10-CM | POA: Diagnosis not present

## 2021-08-07 DIAGNOSIS — Z853 Personal history of malignant neoplasm of breast: Secondary | ICD-10-CM | POA: Diagnosis not present

## 2021-08-07 DIAGNOSIS — N3281 Overactive bladder: Secondary | ICD-10-CM | POA: Diagnosis not present

## 2021-08-07 DIAGNOSIS — I5042 Chronic combined systolic (congestive) and diastolic (congestive) heart failure: Secondary | ICD-10-CM | POA: Diagnosis not present

## 2021-08-07 DIAGNOSIS — Z9841 Cataract extraction status, right eye: Secondary | ICD-10-CM | POA: Diagnosis not present

## 2021-08-09 DIAGNOSIS — M1990 Unspecified osteoarthritis, unspecified site: Secondary | ICD-10-CM | POA: Diagnosis not present

## 2021-08-09 DIAGNOSIS — Z9981 Dependence on supplemental oxygen: Secondary | ICD-10-CM | POA: Diagnosis not present

## 2021-08-09 DIAGNOSIS — E119 Type 2 diabetes mellitus without complications: Secondary | ICD-10-CM | POA: Diagnosis not present

## 2021-08-09 DIAGNOSIS — J9621 Acute and chronic respiratory failure with hypoxia: Secondary | ICD-10-CM | POA: Diagnosis not present

## 2021-08-09 DIAGNOSIS — I10 Essential (primary) hypertension: Secondary | ICD-10-CM | POA: Diagnosis not present

## 2021-08-09 DIAGNOSIS — E785 Hyperlipidemia, unspecified: Secondary | ICD-10-CM | POA: Diagnosis not present

## 2021-08-09 DIAGNOSIS — I4729 Other ventricular tachycardia: Secondary | ICD-10-CM | POA: Diagnosis not present

## 2021-08-09 DIAGNOSIS — Z7982 Long term (current) use of aspirin: Secondary | ICD-10-CM | POA: Diagnosis not present

## 2021-08-09 DIAGNOSIS — Z9842 Cataract extraction status, left eye: Secondary | ICD-10-CM | POA: Diagnosis not present

## 2021-08-09 DIAGNOSIS — N3281 Overactive bladder: Secondary | ICD-10-CM | POA: Diagnosis not present

## 2021-08-09 DIAGNOSIS — Z9181 History of falling: Secondary | ICD-10-CM | POA: Diagnosis not present

## 2021-08-09 DIAGNOSIS — E7800 Pure hypercholesterolemia, unspecified: Secondary | ICD-10-CM | POA: Diagnosis not present

## 2021-08-09 DIAGNOSIS — Z951 Presence of aortocoronary bypass graft: Secondary | ICD-10-CM | POA: Diagnosis not present

## 2021-08-09 DIAGNOSIS — I2510 Atherosclerotic heart disease of native coronary artery without angina pectoris: Secondary | ICD-10-CM | POA: Diagnosis not present

## 2021-08-09 DIAGNOSIS — Z9089 Acquired absence of other organs: Secondary | ICD-10-CM | POA: Diagnosis not present

## 2021-08-09 DIAGNOSIS — Z9841 Cataract extraction status, right eye: Secondary | ICD-10-CM | POA: Diagnosis not present

## 2021-08-09 DIAGNOSIS — I5042 Chronic combined systolic (congestive) and diastolic (congestive) heart failure: Secondary | ICD-10-CM | POA: Diagnosis not present

## 2021-08-09 DIAGNOSIS — Z853 Personal history of malignant neoplasm of breast: Secondary | ICD-10-CM | POA: Diagnosis not present

## 2021-08-09 DIAGNOSIS — I110 Hypertensive heart disease with heart failure: Secondary | ICD-10-CM | POA: Diagnosis not present

## 2021-08-09 DIAGNOSIS — Z8673 Personal history of transient ischemic attack (TIA), and cerebral infarction without residual deficits: Secondary | ICD-10-CM | POA: Diagnosis not present

## 2021-08-09 DIAGNOSIS — E039 Hypothyroidism, unspecified: Secondary | ICD-10-CM | POA: Diagnosis not present

## 2021-08-09 DIAGNOSIS — K219 Gastro-esophageal reflux disease without esophagitis: Secondary | ICD-10-CM | POA: Diagnosis not present

## 2021-08-10 DIAGNOSIS — Z9842 Cataract extraction status, left eye: Secondary | ICD-10-CM | POA: Diagnosis not present

## 2021-08-10 DIAGNOSIS — Z853 Personal history of malignant neoplasm of breast: Secondary | ICD-10-CM | POA: Diagnosis not present

## 2021-08-10 DIAGNOSIS — E119 Type 2 diabetes mellitus without complications: Secondary | ICD-10-CM | POA: Diagnosis not present

## 2021-08-10 DIAGNOSIS — K219 Gastro-esophageal reflux disease without esophagitis: Secondary | ICD-10-CM | POA: Diagnosis not present

## 2021-08-10 DIAGNOSIS — N3281 Overactive bladder: Secondary | ICD-10-CM | POA: Diagnosis not present

## 2021-08-10 DIAGNOSIS — M1990 Unspecified osteoarthritis, unspecified site: Secondary | ICD-10-CM | POA: Diagnosis not present

## 2021-08-10 DIAGNOSIS — Z9089 Acquired absence of other organs: Secondary | ICD-10-CM | POA: Diagnosis not present

## 2021-08-10 DIAGNOSIS — E7800 Pure hypercholesterolemia, unspecified: Secondary | ICD-10-CM | POA: Diagnosis not present

## 2021-08-10 DIAGNOSIS — Z7982 Long term (current) use of aspirin: Secondary | ICD-10-CM | POA: Diagnosis not present

## 2021-08-10 DIAGNOSIS — Z951 Presence of aortocoronary bypass graft: Secondary | ICD-10-CM | POA: Diagnosis not present

## 2021-08-10 DIAGNOSIS — Z9981 Dependence on supplemental oxygen: Secondary | ICD-10-CM | POA: Diagnosis not present

## 2021-08-10 DIAGNOSIS — I4729 Other ventricular tachycardia: Secondary | ICD-10-CM | POA: Diagnosis not present

## 2021-08-10 DIAGNOSIS — E039 Hypothyroidism, unspecified: Secondary | ICD-10-CM | POA: Diagnosis not present

## 2021-08-10 DIAGNOSIS — Z8673 Personal history of transient ischemic attack (TIA), and cerebral infarction without residual deficits: Secondary | ICD-10-CM | POA: Diagnosis not present

## 2021-08-10 DIAGNOSIS — Z9181 History of falling: Secondary | ICD-10-CM | POA: Diagnosis not present

## 2021-08-10 DIAGNOSIS — I5042 Chronic combined systolic (congestive) and diastolic (congestive) heart failure: Secondary | ICD-10-CM | POA: Diagnosis not present

## 2021-08-10 DIAGNOSIS — J9621 Acute and chronic respiratory failure with hypoxia: Secondary | ICD-10-CM | POA: Diagnosis not present

## 2021-08-10 DIAGNOSIS — I110 Hypertensive heart disease with heart failure: Secondary | ICD-10-CM | POA: Diagnosis not present

## 2021-08-10 DIAGNOSIS — I2510 Atherosclerotic heart disease of native coronary artery without angina pectoris: Secondary | ICD-10-CM | POA: Diagnosis not present

## 2021-08-10 DIAGNOSIS — Z9841 Cataract extraction status, right eye: Secondary | ICD-10-CM | POA: Diagnosis not present

## 2021-08-14 DIAGNOSIS — E7800 Pure hypercholesterolemia, unspecified: Secondary | ICD-10-CM | POA: Diagnosis not present

## 2021-08-14 DIAGNOSIS — K219 Gastro-esophageal reflux disease without esophagitis: Secondary | ICD-10-CM | POA: Diagnosis not present

## 2021-08-14 DIAGNOSIS — Z853 Personal history of malignant neoplasm of breast: Secondary | ICD-10-CM | POA: Diagnosis not present

## 2021-08-14 DIAGNOSIS — I110 Hypertensive heart disease with heart failure: Secondary | ICD-10-CM | POA: Diagnosis not present

## 2021-08-14 DIAGNOSIS — E119 Type 2 diabetes mellitus without complications: Secondary | ICD-10-CM | POA: Diagnosis not present

## 2021-08-14 DIAGNOSIS — Z9841 Cataract extraction status, right eye: Secondary | ICD-10-CM | POA: Diagnosis not present

## 2021-08-14 DIAGNOSIS — Z9981 Dependence on supplemental oxygen: Secondary | ICD-10-CM | POA: Diagnosis not present

## 2021-08-14 DIAGNOSIS — E039 Hypothyroidism, unspecified: Secondary | ICD-10-CM | POA: Diagnosis not present

## 2021-08-14 DIAGNOSIS — I4729 Other ventricular tachycardia: Secondary | ICD-10-CM | POA: Diagnosis not present

## 2021-08-14 DIAGNOSIS — Z9089 Acquired absence of other organs: Secondary | ICD-10-CM | POA: Diagnosis not present

## 2021-08-14 DIAGNOSIS — N3281 Overactive bladder: Secondary | ICD-10-CM | POA: Diagnosis not present

## 2021-08-14 DIAGNOSIS — J9621 Acute and chronic respiratory failure with hypoxia: Secondary | ICD-10-CM | POA: Diagnosis not present

## 2021-08-14 DIAGNOSIS — Z7982 Long term (current) use of aspirin: Secondary | ICD-10-CM | POA: Diagnosis not present

## 2021-08-14 DIAGNOSIS — I5042 Chronic combined systolic (congestive) and diastolic (congestive) heart failure: Secondary | ICD-10-CM | POA: Diagnosis not present

## 2021-08-14 DIAGNOSIS — Z9842 Cataract extraction status, left eye: Secondary | ICD-10-CM | POA: Diagnosis not present

## 2021-08-14 DIAGNOSIS — Z8673 Personal history of transient ischemic attack (TIA), and cerebral infarction without residual deficits: Secondary | ICD-10-CM | POA: Diagnosis not present

## 2021-08-14 DIAGNOSIS — M1990 Unspecified osteoarthritis, unspecified site: Secondary | ICD-10-CM | POA: Diagnosis not present

## 2021-08-14 DIAGNOSIS — Z951 Presence of aortocoronary bypass graft: Secondary | ICD-10-CM | POA: Diagnosis not present

## 2021-08-14 DIAGNOSIS — I2510 Atherosclerotic heart disease of native coronary artery without angina pectoris: Secondary | ICD-10-CM | POA: Diagnosis not present

## 2021-08-14 DIAGNOSIS — Z9181 History of falling: Secondary | ICD-10-CM | POA: Diagnosis not present

## 2021-08-15 DIAGNOSIS — J9621 Acute and chronic respiratory failure with hypoxia: Secondary | ICD-10-CM | POA: Diagnosis not present

## 2021-08-15 DIAGNOSIS — I2510 Atherosclerotic heart disease of native coronary artery without angina pectoris: Secondary | ICD-10-CM | POA: Diagnosis not present

## 2021-08-15 DIAGNOSIS — Z9981 Dependence on supplemental oxygen: Secondary | ICD-10-CM | POA: Diagnosis not present

## 2021-08-15 DIAGNOSIS — Z9841 Cataract extraction status, right eye: Secondary | ICD-10-CM | POA: Diagnosis not present

## 2021-08-15 DIAGNOSIS — Z9181 History of falling: Secondary | ICD-10-CM | POA: Diagnosis not present

## 2021-08-15 DIAGNOSIS — I5042 Chronic combined systolic (congestive) and diastolic (congestive) heart failure: Secondary | ICD-10-CM | POA: Diagnosis not present

## 2021-08-15 DIAGNOSIS — Z7982 Long term (current) use of aspirin: Secondary | ICD-10-CM | POA: Diagnosis not present

## 2021-08-15 DIAGNOSIS — E039 Hypothyroidism, unspecified: Secondary | ICD-10-CM | POA: Diagnosis not present

## 2021-08-15 DIAGNOSIS — Z853 Personal history of malignant neoplasm of breast: Secondary | ICD-10-CM | POA: Diagnosis not present

## 2021-08-15 DIAGNOSIS — I110 Hypertensive heart disease with heart failure: Secondary | ICD-10-CM | POA: Diagnosis not present

## 2021-08-15 DIAGNOSIS — E7800 Pure hypercholesterolemia, unspecified: Secondary | ICD-10-CM | POA: Diagnosis not present

## 2021-08-15 DIAGNOSIS — N3281 Overactive bladder: Secondary | ICD-10-CM | POA: Diagnosis not present

## 2021-08-15 DIAGNOSIS — M1990 Unspecified osteoarthritis, unspecified site: Secondary | ICD-10-CM | POA: Diagnosis not present

## 2021-08-15 DIAGNOSIS — Z9842 Cataract extraction status, left eye: Secondary | ICD-10-CM | POA: Diagnosis not present

## 2021-08-15 DIAGNOSIS — Z9089 Acquired absence of other organs: Secondary | ICD-10-CM | POA: Diagnosis not present

## 2021-08-15 DIAGNOSIS — K219 Gastro-esophageal reflux disease without esophagitis: Secondary | ICD-10-CM | POA: Diagnosis not present

## 2021-08-15 DIAGNOSIS — E119 Type 2 diabetes mellitus without complications: Secondary | ICD-10-CM | POA: Diagnosis not present

## 2021-08-15 DIAGNOSIS — I4729 Other ventricular tachycardia: Secondary | ICD-10-CM | POA: Diagnosis not present

## 2021-08-15 DIAGNOSIS — Z8673 Personal history of transient ischemic attack (TIA), and cerebral infarction without residual deficits: Secondary | ICD-10-CM | POA: Diagnosis not present

## 2021-08-15 DIAGNOSIS — Z951 Presence of aortocoronary bypass graft: Secondary | ICD-10-CM | POA: Diagnosis not present

## 2021-08-16 DIAGNOSIS — N3281 Overactive bladder: Secondary | ICD-10-CM | POA: Diagnosis not present

## 2021-08-16 DIAGNOSIS — I2510 Atherosclerotic heart disease of native coronary artery without angina pectoris: Secondary | ICD-10-CM | POA: Diagnosis not present

## 2021-08-16 DIAGNOSIS — Z951 Presence of aortocoronary bypass graft: Secondary | ICD-10-CM | POA: Diagnosis not present

## 2021-08-16 DIAGNOSIS — Z9181 History of falling: Secondary | ICD-10-CM | POA: Diagnosis not present

## 2021-08-16 DIAGNOSIS — Z9842 Cataract extraction status, left eye: Secondary | ICD-10-CM | POA: Diagnosis not present

## 2021-08-16 DIAGNOSIS — M1990 Unspecified osteoarthritis, unspecified site: Secondary | ICD-10-CM | POA: Diagnosis not present

## 2021-08-16 DIAGNOSIS — Z853 Personal history of malignant neoplasm of breast: Secondary | ICD-10-CM | POA: Diagnosis not present

## 2021-08-16 DIAGNOSIS — Z9981 Dependence on supplemental oxygen: Secondary | ICD-10-CM | POA: Diagnosis not present

## 2021-08-16 DIAGNOSIS — E039 Hypothyroidism, unspecified: Secondary | ICD-10-CM | POA: Diagnosis not present

## 2021-08-16 DIAGNOSIS — Z8673 Personal history of transient ischemic attack (TIA), and cerebral infarction without residual deficits: Secondary | ICD-10-CM | POA: Diagnosis not present

## 2021-08-16 DIAGNOSIS — E119 Type 2 diabetes mellitus without complications: Secondary | ICD-10-CM | POA: Diagnosis not present

## 2021-08-16 DIAGNOSIS — I110 Hypertensive heart disease with heart failure: Secondary | ICD-10-CM | POA: Diagnosis not present

## 2021-08-16 DIAGNOSIS — E7800 Pure hypercholesterolemia, unspecified: Secondary | ICD-10-CM | POA: Diagnosis not present

## 2021-08-16 DIAGNOSIS — Z9841 Cataract extraction status, right eye: Secondary | ICD-10-CM | POA: Diagnosis not present

## 2021-08-16 DIAGNOSIS — J9621 Acute and chronic respiratory failure with hypoxia: Secondary | ICD-10-CM | POA: Diagnosis not present

## 2021-08-16 DIAGNOSIS — I4729 Other ventricular tachycardia: Secondary | ICD-10-CM | POA: Diagnosis not present

## 2021-08-16 DIAGNOSIS — K219 Gastro-esophageal reflux disease without esophagitis: Secondary | ICD-10-CM | POA: Diagnosis not present

## 2021-08-16 DIAGNOSIS — I5042 Chronic combined systolic (congestive) and diastolic (congestive) heart failure: Secondary | ICD-10-CM | POA: Diagnosis not present

## 2021-08-16 DIAGNOSIS — Z9089 Acquired absence of other organs: Secondary | ICD-10-CM | POA: Diagnosis not present

## 2021-08-16 DIAGNOSIS — Z7982 Long term (current) use of aspirin: Secondary | ICD-10-CM | POA: Diagnosis not present

## 2021-08-18 DIAGNOSIS — I5020 Unspecified systolic (congestive) heart failure: Secondary | ICD-10-CM | POA: Diagnosis not present

## 2021-08-21 DIAGNOSIS — K219 Gastro-esophageal reflux disease without esophagitis: Secondary | ICD-10-CM | POA: Diagnosis not present

## 2021-08-21 DIAGNOSIS — Z7982 Long term (current) use of aspirin: Secondary | ICD-10-CM | POA: Diagnosis not present

## 2021-08-21 DIAGNOSIS — E7800 Pure hypercholesterolemia, unspecified: Secondary | ICD-10-CM | POA: Diagnosis not present

## 2021-08-21 DIAGNOSIS — E119 Type 2 diabetes mellitus without complications: Secondary | ICD-10-CM | POA: Diagnosis not present

## 2021-08-21 DIAGNOSIS — Z853 Personal history of malignant neoplasm of breast: Secondary | ICD-10-CM | POA: Diagnosis not present

## 2021-08-21 DIAGNOSIS — Z951 Presence of aortocoronary bypass graft: Secondary | ICD-10-CM | POA: Diagnosis not present

## 2021-08-21 DIAGNOSIS — Z9842 Cataract extraction status, left eye: Secondary | ICD-10-CM | POA: Diagnosis not present

## 2021-08-21 DIAGNOSIS — I110 Hypertensive heart disease with heart failure: Secondary | ICD-10-CM | POA: Diagnosis not present

## 2021-08-21 DIAGNOSIS — Z9841 Cataract extraction status, right eye: Secondary | ICD-10-CM | POA: Diagnosis not present

## 2021-08-21 DIAGNOSIS — E039 Hypothyroidism, unspecified: Secondary | ICD-10-CM | POA: Diagnosis not present

## 2021-08-21 DIAGNOSIS — Z9181 History of falling: Secondary | ICD-10-CM | POA: Diagnosis not present

## 2021-08-21 DIAGNOSIS — I4729 Other ventricular tachycardia: Secondary | ICD-10-CM | POA: Diagnosis not present

## 2021-08-21 DIAGNOSIS — M1990 Unspecified osteoarthritis, unspecified site: Secondary | ICD-10-CM | POA: Diagnosis not present

## 2021-08-21 DIAGNOSIS — I5042 Chronic combined systolic (congestive) and diastolic (congestive) heart failure: Secondary | ICD-10-CM | POA: Diagnosis not present

## 2021-08-21 DIAGNOSIS — I2510 Atherosclerotic heart disease of native coronary artery without angina pectoris: Secondary | ICD-10-CM | POA: Diagnosis not present

## 2021-08-21 DIAGNOSIS — Z9089 Acquired absence of other organs: Secondary | ICD-10-CM | POA: Diagnosis not present

## 2021-08-21 DIAGNOSIS — Z9981 Dependence on supplemental oxygen: Secondary | ICD-10-CM | POA: Diagnosis not present

## 2021-08-21 DIAGNOSIS — Z8673 Personal history of transient ischemic attack (TIA), and cerebral infarction without residual deficits: Secondary | ICD-10-CM | POA: Diagnosis not present

## 2021-08-21 DIAGNOSIS — J9621 Acute and chronic respiratory failure with hypoxia: Secondary | ICD-10-CM | POA: Diagnosis not present

## 2021-08-21 DIAGNOSIS — N3281 Overactive bladder: Secondary | ICD-10-CM | POA: Diagnosis not present

## 2021-08-28 DIAGNOSIS — E7800 Pure hypercholesterolemia, unspecified: Secondary | ICD-10-CM | POA: Diagnosis not present

## 2021-08-28 DIAGNOSIS — K219 Gastro-esophageal reflux disease without esophagitis: Secondary | ICD-10-CM | POA: Diagnosis not present

## 2021-08-28 DIAGNOSIS — I2510 Atherosclerotic heart disease of native coronary artery without angina pectoris: Secondary | ICD-10-CM | POA: Diagnosis not present

## 2021-08-28 DIAGNOSIS — Z7982 Long term (current) use of aspirin: Secondary | ICD-10-CM | POA: Diagnosis not present

## 2021-08-28 DIAGNOSIS — E119 Type 2 diabetes mellitus without complications: Secondary | ICD-10-CM | POA: Diagnosis not present

## 2021-08-28 DIAGNOSIS — Z951 Presence of aortocoronary bypass graft: Secondary | ICD-10-CM | POA: Diagnosis not present

## 2021-08-28 DIAGNOSIS — M1990 Unspecified osteoarthritis, unspecified site: Secondary | ICD-10-CM | POA: Diagnosis not present

## 2021-08-28 DIAGNOSIS — Z9981 Dependence on supplemental oxygen: Secondary | ICD-10-CM | POA: Diagnosis not present

## 2021-08-28 DIAGNOSIS — J9621 Acute and chronic respiratory failure with hypoxia: Secondary | ICD-10-CM | POA: Diagnosis not present

## 2021-08-28 DIAGNOSIS — Z9842 Cataract extraction status, left eye: Secondary | ICD-10-CM | POA: Diagnosis not present

## 2021-08-28 DIAGNOSIS — Z8673 Personal history of transient ischemic attack (TIA), and cerebral infarction without residual deficits: Secondary | ICD-10-CM | POA: Diagnosis not present

## 2021-08-28 DIAGNOSIS — I4729 Other ventricular tachycardia: Secondary | ICD-10-CM | POA: Diagnosis not present

## 2021-08-28 DIAGNOSIS — Z9841 Cataract extraction status, right eye: Secondary | ICD-10-CM | POA: Diagnosis not present

## 2021-08-28 DIAGNOSIS — N3281 Overactive bladder: Secondary | ICD-10-CM | POA: Diagnosis not present

## 2021-08-28 DIAGNOSIS — I110 Hypertensive heart disease with heart failure: Secondary | ICD-10-CM | POA: Diagnosis not present

## 2021-08-28 DIAGNOSIS — I5042 Chronic combined systolic (congestive) and diastolic (congestive) heart failure: Secondary | ICD-10-CM | POA: Diagnosis not present

## 2021-08-28 DIAGNOSIS — E039 Hypothyroidism, unspecified: Secondary | ICD-10-CM | POA: Diagnosis not present

## 2021-08-28 DIAGNOSIS — Z9089 Acquired absence of other organs: Secondary | ICD-10-CM | POA: Diagnosis not present

## 2021-08-28 DIAGNOSIS — Z853 Personal history of malignant neoplasm of breast: Secondary | ICD-10-CM | POA: Diagnosis not present

## 2021-08-28 DIAGNOSIS — Z9181 History of falling: Secondary | ICD-10-CM | POA: Diagnosis not present

## 2021-09-04 DIAGNOSIS — E1122 Type 2 diabetes mellitus with diabetic chronic kidney disease: Secondary | ICD-10-CM | POA: Diagnosis not present

## 2021-09-04 DIAGNOSIS — E039 Hypothyroidism, unspecified: Secondary | ICD-10-CM | POA: Diagnosis not present

## 2021-09-04 DIAGNOSIS — Z9981 Dependence on supplemental oxygen: Secondary | ICD-10-CM | POA: Diagnosis not present

## 2021-09-04 DIAGNOSIS — I5042 Chronic combined systolic (congestive) and diastolic (congestive) heart failure: Secondary | ICD-10-CM | POA: Diagnosis not present

## 2021-09-04 DIAGNOSIS — M810 Age-related osteoporosis without current pathological fracture: Secondary | ICD-10-CM | POA: Diagnosis not present

## 2021-09-04 DIAGNOSIS — J9611 Chronic respiratory failure with hypoxia: Secondary | ICD-10-CM | POA: Diagnosis not present

## 2021-09-04 DIAGNOSIS — N1831 Chronic kidney disease, stage 3a: Secondary | ICD-10-CM | POA: Diagnosis not present

## 2021-09-04 DIAGNOSIS — E782 Mixed hyperlipidemia: Secondary | ICD-10-CM | POA: Diagnosis not present

## 2021-09-06 DIAGNOSIS — K219 Gastro-esophageal reflux disease without esophagitis: Secondary | ICD-10-CM | POA: Diagnosis not present

## 2021-09-06 DIAGNOSIS — Z9089 Acquired absence of other organs: Secondary | ICD-10-CM | POA: Diagnosis not present

## 2021-09-06 DIAGNOSIS — E119 Type 2 diabetes mellitus without complications: Secondary | ICD-10-CM | POA: Diagnosis not present

## 2021-09-06 DIAGNOSIS — Z8673 Personal history of transient ischemic attack (TIA), and cerebral infarction without residual deficits: Secondary | ICD-10-CM | POA: Diagnosis not present

## 2021-09-06 DIAGNOSIS — I110 Hypertensive heart disease with heart failure: Secondary | ICD-10-CM | POA: Diagnosis not present

## 2021-09-06 DIAGNOSIS — Z853 Personal history of malignant neoplasm of breast: Secondary | ICD-10-CM | POA: Diagnosis not present

## 2021-09-06 DIAGNOSIS — M1990 Unspecified osteoarthritis, unspecified site: Secondary | ICD-10-CM | POA: Diagnosis not present

## 2021-09-06 DIAGNOSIS — Z9842 Cataract extraction status, left eye: Secondary | ICD-10-CM | POA: Diagnosis not present

## 2021-09-06 DIAGNOSIS — E039 Hypothyroidism, unspecified: Secondary | ICD-10-CM | POA: Diagnosis not present

## 2021-09-06 DIAGNOSIS — I4729 Other ventricular tachycardia: Secondary | ICD-10-CM | POA: Diagnosis not present

## 2021-09-06 DIAGNOSIS — I5042 Chronic combined systolic (congestive) and diastolic (congestive) heart failure: Secondary | ICD-10-CM | POA: Diagnosis not present

## 2021-09-06 DIAGNOSIS — J9621 Acute and chronic respiratory failure with hypoxia: Secondary | ICD-10-CM | POA: Diagnosis not present

## 2021-09-06 DIAGNOSIS — E7800 Pure hypercholesterolemia, unspecified: Secondary | ICD-10-CM | POA: Diagnosis not present

## 2021-09-06 DIAGNOSIS — Z9181 History of falling: Secondary | ICD-10-CM | POA: Diagnosis not present

## 2021-09-06 DIAGNOSIS — N3281 Overactive bladder: Secondary | ICD-10-CM | POA: Diagnosis not present

## 2021-09-06 DIAGNOSIS — Z9981 Dependence on supplemental oxygen: Secondary | ICD-10-CM | POA: Diagnosis not present

## 2021-09-06 DIAGNOSIS — Z951 Presence of aortocoronary bypass graft: Secondary | ICD-10-CM | POA: Diagnosis not present

## 2021-09-06 DIAGNOSIS — Z7982 Long term (current) use of aspirin: Secondary | ICD-10-CM | POA: Diagnosis not present

## 2021-09-06 DIAGNOSIS — Z9841 Cataract extraction status, right eye: Secondary | ICD-10-CM | POA: Diagnosis not present

## 2021-09-06 DIAGNOSIS — I2510 Atherosclerotic heart disease of native coronary artery without angina pectoris: Secondary | ICD-10-CM | POA: Diagnosis not present

## 2021-09-17 DIAGNOSIS — I5020 Unspecified systolic (congestive) heart failure: Secondary | ICD-10-CM | POA: Diagnosis not present

## 2021-10-04 ENCOUNTER — Other Ambulatory Visit: Payer: Self-pay | Admitting: Cardiovascular Disease

## 2021-10-13 ENCOUNTER — Ambulatory Visit: Payer: Medicare HMO | Admitting: Cardiovascular Disease

## 2021-10-18 DIAGNOSIS — I5020 Unspecified systolic (congestive) heart failure: Secondary | ICD-10-CM | POA: Diagnosis not present

## 2021-11-18 DIAGNOSIS — I5020 Unspecified systolic (congestive) heart failure: Secondary | ICD-10-CM | POA: Diagnosis not present

## 2021-12-05 DIAGNOSIS — I130 Hypertensive heart and chronic kidney disease with heart failure and stage 1 through stage 4 chronic kidney disease, or unspecified chronic kidney disease: Secondary | ICD-10-CM | POA: Diagnosis not present

## 2021-12-05 DIAGNOSIS — N3281 Overactive bladder: Secondary | ICD-10-CM | POA: Diagnosis not present

## 2021-12-05 DIAGNOSIS — E782 Mixed hyperlipidemia: Secondary | ICD-10-CM | POA: Diagnosis not present

## 2021-12-05 DIAGNOSIS — K219 Gastro-esophageal reflux disease without esophagitis: Secondary | ICD-10-CM | POA: Diagnosis not present

## 2021-12-05 DIAGNOSIS — Z7984 Long term (current) use of oral hypoglycemic drugs: Secondary | ICD-10-CM | POA: Diagnosis not present

## 2021-12-05 DIAGNOSIS — Z951 Presence of aortocoronary bypass graft: Secondary | ICD-10-CM | POA: Diagnosis not present

## 2021-12-05 DIAGNOSIS — M170 Bilateral primary osteoarthritis of knee: Secondary | ICD-10-CM | POA: Diagnosis not present

## 2021-12-05 DIAGNOSIS — Z7982 Long term (current) use of aspirin: Secondary | ICD-10-CM | POA: Diagnosis not present

## 2021-12-05 DIAGNOSIS — Z853 Personal history of malignant neoplasm of breast: Secondary | ICD-10-CM | POA: Diagnosis not present

## 2021-12-05 DIAGNOSIS — I2510 Atherosclerotic heart disease of native coronary artery without angina pectoris: Secondary | ICD-10-CM | POA: Diagnosis not present

## 2021-12-05 DIAGNOSIS — I5042 Chronic combined systolic (congestive) and diastolic (congestive) heart failure: Secondary | ICD-10-CM | POA: Diagnosis not present

## 2021-12-05 DIAGNOSIS — Z9181 History of falling: Secondary | ICD-10-CM | POA: Diagnosis not present

## 2021-12-05 DIAGNOSIS — M810 Age-related osteoporosis without current pathological fracture: Secondary | ICD-10-CM | POA: Diagnosis not present

## 2021-12-05 DIAGNOSIS — F3340 Major depressive disorder, recurrent, in remission, unspecified: Secondary | ICD-10-CM | POA: Diagnosis not present

## 2021-12-05 DIAGNOSIS — Z8673 Personal history of transient ischemic attack (TIA), and cerebral infarction without residual deficits: Secondary | ICD-10-CM | POA: Diagnosis not present

## 2021-12-05 DIAGNOSIS — E039 Hypothyroidism, unspecified: Secondary | ICD-10-CM | POA: Diagnosis not present

## 2021-12-05 DIAGNOSIS — J9611 Chronic respiratory failure with hypoxia: Secondary | ICD-10-CM | POA: Diagnosis not present

## 2021-12-05 DIAGNOSIS — Z9981 Dependence on supplemental oxygen: Secondary | ICD-10-CM | POA: Diagnosis not present

## 2021-12-05 DIAGNOSIS — E1122 Type 2 diabetes mellitus with diabetic chronic kidney disease: Secondary | ICD-10-CM | POA: Diagnosis not present

## 2021-12-05 DIAGNOSIS — N1831 Chronic kidney disease, stage 3a: Secondary | ICD-10-CM | POA: Diagnosis not present

## 2021-12-11 DIAGNOSIS — J9611 Chronic respiratory failure with hypoxia: Secondary | ICD-10-CM | POA: Diagnosis not present

## 2021-12-11 DIAGNOSIS — Z8673 Personal history of transient ischemic attack (TIA), and cerebral infarction without residual deficits: Secondary | ICD-10-CM | POA: Diagnosis not present

## 2021-12-11 DIAGNOSIS — I5042 Chronic combined systolic (congestive) and diastolic (congestive) heart failure: Secondary | ICD-10-CM | POA: Diagnosis not present

## 2021-12-11 DIAGNOSIS — M810 Age-related osteoporosis without current pathological fracture: Secondary | ICD-10-CM | POA: Diagnosis not present

## 2021-12-11 DIAGNOSIS — I130 Hypertensive heart and chronic kidney disease with heart failure and stage 1 through stage 4 chronic kidney disease, or unspecified chronic kidney disease: Secondary | ICD-10-CM | POA: Diagnosis not present

## 2021-12-11 DIAGNOSIS — E782 Mixed hyperlipidemia: Secondary | ICD-10-CM | POA: Diagnosis not present

## 2021-12-11 DIAGNOSIS — M170 Bilateral primary osteoarthritis of knee: Secondary | ICD-10-CM | POA: Diagnosis not present

## 2021-12-11 DIAGNOSIS — Z951 Presence of aortocoronary bypass graft: Secondary | ICD-10-CM | POA: Diagnosis not present

## 2021-12-11 DIAGNOSIS — K219 Gastro-esophageal reflux disease without esophagitis: Secondary | ICD-10-CM | POA: Diagnosis not present

## 2021-12-11 DIAGNOSIS — Z7984 Long term (current) use of oral hypoglycemic drugs: Secondary | ICD-10-CM | POA: Diagnosis not present

## 2021-12-11 DIAGNOSIS — I2510 Atherosclerotic heart disease of native coronary artery without angina pectoris: Secondary | ICD-10-CM | POA: Diagnosis not present

## 2021-12-11 DIAGNOSIS — N1831 Chronic kidney disease, stage 3a: Secondary | ICD-10-CM | POA: Diagnosis not present

## 2021-12-11 DIAGNOSIS — E039 Hypothyroidism, unspecified: Secondary | ICD-10-CM | POA: Diagnosis not present

## 2021-12-11 DIAGNOSIS — Z9981 Dependence on supplemental oxygen: Secondary | ICD-10-CM | POA: Diagnosis not present

## 2021-12-11 DIAGNOSIS — Z853 Personal history of malignant neoplasm of breast: Secondary | ICD-10-CM | POA: Diagnosis not present

## 2021-12-11 DIAGNOSIS — N3281 Overactive bladder: Secondary | ICD-10-CM | POA: Diagnosis not present

## 2021-12-11 DIAGNOSIS — Z7982 Long term (current) use of aspirin: Secondary | ICD-10-CM | POA: Diagnosis not present

## 2021-12-11 DIAGNOSIS — Z9181 History of falling: Secondary | ICD-10-CM | POA: Diagnosis not present

## 2021-12-11 DIAGNOSIS — F3340 Major depressive disorder, recurrent, in remission, unspecified: Secondary | ICD-10-CM | POA: Diagnosis not present

## 2021-12-11 DIAGNOSIS — E1122 Type 2 diabetes mellitus with diabetic chronic kidney disease: Secondary | ICD-10-CM | POA: Diagnosis not present

## 2021-12-13 DIAGNOSIS — N1831 Chronic kidney disease, stage 3a: Secondary | ICD-10-CM | POA: Diagnosis not present

## 2021-12-13 DIAGNOSIS — E782 Mixed hyperlipidemia: Secondary | ICD-10-CM | POA: Diagnosis not present

## 2021-12-13 DIAGNOSIS — I2510 Atherosclerotic heart disease of native coronary artery without angina pectoris: Secondary | ICD-10-CM | POA: Diagnosis not present

## 2021-12-13 DIAGNOSIS — J9611 Chronic respiratory failure with hypoxia: Secondary | ICD-10-CM | POA: Diagnosis not present

## 2021-12-13 DIAGNOSIS — F3340 Major depressive disorder, recurrent, in remission, unspecified: Secondary | ICD-10-CM | POA: Diagnosis not present

## 2021-12-13 DIAGNOSIS — Z951 Presence of aortocoronary bypass graft: Secondary | ICD-10-CM | POA: Diagnosis not present

## 2021-12-13 DIAGNOSIS — Z8673 Personal history of transient ischemic attack (TIA), and cerebral infarction without residual deficits: Secondary | ICD-10-CM | POA: Diagnosis not present

## 2021-12-13 DIAGNOSIS — Z7982 Long term (current) use of aspirin: Secondary | ICD-10-CM | POA: Diagnosis not present

## 2021-12-13 DIAGNOSIS — I130 Hypertensive heart and chronic kidney disease with heart failure and stage 1 through stage 4 chronic kidney disease, or unspecified chronic kidney disease: Secondary | ICD-10-CM | POA: Diagnosis not present

## 2021-12-13 DIAGNOSIS — N3281 Overactive bladder: Secondary | ICD-10-CM | POA: Diagnosis not present

## 2021-12-13 DIAGNOSIS — M170 Bilateral primary osteoarthritis of knee: Secondary | ICD-10-CM | POA: Diagnosis not present

## 2021-12-13 DIAGNOSIS — Z9981 Dependence on supplemental oxygen: Secondary | ICD-10-CM | POA: Diagnosis not present

## 2021-12-13 DIAGNOSIS — K219 Gastro-esophageal reflux disease without esophagitis: Secondary | ICD-10-CM | POA: Diagnosis not present

## 2021-12-13 DIAGNOSIS — Z9181 History of falling: Secondary | ICD-10-CM | POA: Diagnosis not present

## 2021-12-13 DIAGNOSIS — E1122 Type 2 diabetes mellitus with diabetic chronic kidney disease: Secondary | ICD-10-CM | POA: Diagnosis not present

## 2021-12-13 DIAGNOSIS — Z7984 Long term (current) use of oral hypoglycemic drugs: Secondary | ICD-10-CM | POA: Diagnosis not present

## 2021-12-13 DIAGNOSIS — Z853 Personal history of malignant neoplasm of breast: Secondary | ICD-10-CM | POA: Diagnosis not present

## 2021-12-13 DIAGNOSIS — I5042 Chronic combined systolic (congestive) and diastolic (congestive) heart failure: Secondary | ICD-10-CM | POA: Diagnosis not present

## 2021-12-13 DIAGNOSIS — E039 Hypothyroidism, unspecified: Secondary | ICD-10-CM | POA: Diagnosis not present

## 2021-12-13 DIAGNOSIS — M810 Age-related osteoporosis without current pathological fracture: Secondary | ICD-10-CM | POA: Diagnosis not present

## 2021-12-18 DIAGNOSIS — K219 Gastro-esophageal reflux disease without esophagitis: Secondary | ICD-10-CM | POA: Diagnosis not present

## 2021-12-18 DIAGNOSIS — I5020 Unspecified systolic (congestive) heart failure: Secondary | ICD-10-CM | POA: Diagnosis not present

## 2021-12-18 DIAGNOSIS — J9611 Chronic respiratory failure with hypoxia: Secondary | ICD-10-CM | POA: Diagnosis not present

## 2021-12-18 DIAGNOSIS — I130 Hypertensive heart and chronic kidney disease with heart failure and stage 1 through stage 4 chronic kidney disease, or unspecified chronic kidney disease: Secondary | ICD-10-CM | POA: Diagnosis not present

## 2021-12-18 DIAGNOSIS — M170 Bilateral primary osteoarthritis of knee: Secondary | ICD-10-CM | POA: Diagnosis not present

## 2021-12-18 DIAGNOSIS — Z8673 Personal history of transient ischemic attack (TIA), and cerebral infarction without residual deficits: Secondary | ICD-10-CM | POA: Diagnosis not present

## 2021-12-18 DIAGNOSIS — F3340 Major depressive disorder, recurrent, in remission, unspecified: Secondary | ICD-10-CM | POA: Diagnosis not present

## 2021-12-18 DIAGNOSIS — Z7984 Long term (current) use of oral hypoglycemic drugs: Secondary | ICD-10-CM | POA: Diagnosis not present

## 2021-12-18 DIAGNOSIS — N1831 Chronic kidney disease, stage 3a: Secondary | ICD-10-CM | POA: Diagnosis not present

## 2021-12-18 DIAGNOSIS — E782 Mixed hyperlipidemia: Secondary | ICD-10-CM | POA: Diagnosis not present

## 2021-12-18 DIAGNOSIS — Z7982 Long term (current) use of aspirin: Secondary | ICD-10-CM | POA: Diagnosis not present

## 2021-12-18 DIAGNOSIS — I5042 Chronic combined systolic (congestive) and diastolic (congestive) heart failure: Secondary | ICD-10-CM | POA: Diagnosis not present

## 2021-12-18 DIAGNOSIS — Z853 Personal history of malignant neoplasm of breast: Secondary | ICD-10-CM | POA: Diagnosis not present

## 2021-12-18 DIAGNOSIS — M810 Age-related osteoporosis without current pathological fracture: Secondary | ICD-10-CM | POA: Diagnosis not present

## 2021-12-18 DIAGNOSIS — N3281 Overactive bladder: Secondary | ICD-10-CM | POA: Diagnosis not present

## 2021-12-18 DIAGNOSIS — E039 Hypothyroidism, unspecified: Secondary | ICD-10-CM | POA: Diagnosis not present

## 2021-12-18 DIAGNOSIS — I2510 Atherosclerotic heart disease of native coronary artery without angina pectoris: Secondary | ICD-10-CM | POA: Diagnosis not present

## 2021-12-18 DIAGNOSIS — E1122 Type 2 diabetes mellitus with diabetic chronic kidney disease: Secondary | ICD-10-CM | POA: Diagnosis not present

## 2021-12-18 DIAGNOSIS — Z9181 History of falling: Secondary | ICD-10-CM | POA: Diagnosis not present

## 2021-12-18 DIAGNOSIS — Z951 Presence of aortocoronary bypass graft: Secondary | ICD-10-CM | POA: Diagnosis not present

## 2021-12-18 DIAGNOSIS — Z9981 Dependence on supplemental oxygen: Secondary | ICD-10-CM | POA: Diagnosis not present

## 2021-12-27 DIAGNOSIS — E039 Hypothyroidism, unspecified: Secondary | ICD-10-CM | POA: Diagnosis not present

## 2021-12-27 DIAGNOSIS — N1831 Chronic kidney disease, stage 3a: Secondary | ICD-10-CM | POA: Diagnosis not present

## 2021-12-27 DIAGNOSIS — I5042 Chronic combined systolic (congestive) and diastolic (congestive) heart failure: Secondary | ICD-10-CM | POA: Diagnosis not present

## 2021-12-27 DIAGNOSIS — Z9181 History of falling: Secondary | ICD-10-CM | POA: Diagnosis not present

## 2021-12-27 DIAGNOSIS — E782 Mixed hyperlipidemia: Secondary | ICD-10-CM | POA: Diagnosis not present

## 2021-12-27 DIAGNOSIS — N3281 Overactive bladder: Secondary | ICD-10-CM | POA: Diagnosis not present

## 2021-12-27 DIAGNOSIS — Z8673 Personal history of transient ischemic attack (TIA), and cerebral infarction without residual deficits: Secondary | ICD-10-CM | POA: Diagnosis not present

## 2021-12-27 DIAGNOSIS — M170 Bilateral primary osteoarthritis of knee: Secondary | ICD-10-CM | POA: Diagnosis not present

## 2021-12-27 DIAGNOSIS — E1122 Type 2 diabetes mellitus with diabetic chronic kidney disease: Secondary | ICD-10-CM | POA: Diagnosis not present

## 2021-12-27 DIAGNOSIS — M810 Age-related osteoporosis without current pathological fracture: Secondary | ICD-10-CM | POA: Diagnosis not present

## 2021-12-27 DIAGNOSIS — K219 Gastro-esophageal reflux disease without esophagitis: Secondary | ICD-10-CM | POA: Diagnosis not present

## 2021-12-27 DIAGNOSIS — F3340 Major depressive disorder, recurrent, in remission, unspecified: Secondary | ICD-10-CM | POA: Diagnosis not present

## 2021-12-27 DIAGNOSIS — Z7982 Long term (current) use of aspirin: Secondary | ICD-10-CM | POA: Diagnosis not present

## 2021-12-27 DIAGNOSIS — I2510 Atherosclerotic heart disease of native coronary artery without angina pectoris: Secondary | ICD-10-CM | POA: Diagnosis not present

## 2021-12-27 DIAGNOSIS — Z853 Personal history of malignant neoplasm of breast: Secondary | ICD-10-CM | POA: Diagnosis not present

## 2021-12-27 DIAGNOSIS — Z951 Presence of aortocoronary bypass graft: Secondary | ICD-10-CM | POA: Diagnosis not present

## 2021-12-27 DIAGNOSIS — I130 Hypertensive heart and chronic kidney disease with heart failure and stage 1 through stage 4 chronic kidney disease, or unspecified chronic kidney disease: Secondary | ICD-10-CM | POA: Diagnosis not present

## 2021-12-27 DIAGNOSIS — J9611 Chronic respiratory failure with hypoxia: Secondary | ICD-10-CM | POA: Diagnosis not present

## 2021-12-27 DIAGNOSIS — Z7984 Long term (current) use of oral hypoglycemic drugs: Secondary | ICD-10-CM | POA: Diagnosis not present

## 2021-12-27 DIAGNOSIS — Z9981 Dependence on supplemental oxygen: Secondary | ICD-10-CM | POA: Diagnosis not present

## 2021-12-29 DIAGNOSIS — Z951 Presence of aortocoronary bypass graft: Secondary | ICD-10-CM | POA: Diagnosis not present

## 2021-12-29 DIAGNOSIS — I5042 Chronic combined systolic (congestive) and diastolic (congestive) heart failure: Secondary | ICD-10-CM | POA: Diagnosis not present

## 2021-12-29 DIAGNOSIS — E039 Hypothyroidism, unspecified: Secondary | ICD-10-CM | POA: Diagnosis not present

## 2021-12-29 DIAGNOSIS — J9611 Chronic respiratory failure with hypoxia: Secondary | ICD-10-CM | POA: Diagnosis not present

## 2021-12-29 DIAGNOSIS — N1831 Chronic kidney disease, stage 3a: Secondary | ICD-10-CM | POA: Diagnosis not present

## 2021-12-29 DIAGNOSIS — Z8673 Personal history of transient ischemic attack (TIA), and cerebral infarction without residual deficits: Secondary | ICD-10-CM | POA: Diagnosis not present

## 2021-12-29 DIAGNOSIS — Z7984 Long term (current) use of oral hypoglycemic drugs: Secondary | ICD-10-CM | POA: Diagnosis not present

## 2021-12-29 DIAGNOSIS — Z853 Personal history of malignant neoplasm of breast: Secondary | ICD-10-CM | POA: Diagnosis not present

## 2021-12-29 DIAGNOSIS — N3281 Overactive bladder: Secondary | ICD-10-CM | POA: Diagnosis not present

## 2021-12-29 DIAGNOSIS — Z7982 Long term (current) use of aspirin: Secondary | ICD-10-CM | POA: Diagnosis not present

## 2021-12-29 DIAGNOSIS — M810 Age-related osteoporosis without current pathological fracture: Secondary | ICD-10-CM | POA: Diagnosis not present

## 2021-12-29 DIAGNOSIS — F3340 Major depressive disorder, recurrent, in remission, unspecified: Secondary | ICD-10-CM | POA: Diagnosis not present

## 2021-12-29 DIAGNOSIS — E782 Mixed hyperlipidemia: Secondary | ICD-10-CM | POA: Diagnosis not present

## 2021-12-29 DIAGNOSIS — M170 Bilateral primary osteoarthritis of knee: Secondary | ICD-10-CM | POA: Diagnosis not present

## 2021-12-29 DIAGNOSIS — K219 Gastro-esophageal reflux disease without esophagitis: Secondary | ICD-10-CM | POA: Diagnosis not present

## 2021-12-29 DIAGNOSIS — Z9981 Dependence on supplemental oxygen: Secondary | ICD-10-CM | POA: Diagnosis not present

## 2021-12-29 DIAGNOSIS — I2510 Atherosclerotic heart disease of native coronary artery without angina pectoris: Secondary | ICD-10-CM | POA: Diagnosis not present

## 2021-12-29 DIAGNOSIS — I130 Hypertensive heart and chronic kidney disease with heart failure and stage 1 through stage 4 chronic kidney disease, or unspecified chronic kidney disease: Secondary | ICD-10-CM | POA: Diagnosis not present

## 2021-12-29 DIAGNOSIS — E1122 Type 2 diabetes mellitus with diabetic chronic kidney disease: Secondary | ICD-10-CM | POA: Diagnosis not present

## 2021-12-29 DIAGNOSIS — Z9181 History of falling: Secondary | ICD-10-CM | POA: Diagnosis not present

## 2022-01-08 DIAGNOSIS — Z7984 Long term (current) use of oral hypoglycemic drugs: Secondary | ICD-10-CM | POA: Diagnosis not present

## 2022-01-08 DIAGNOSIS — K219 Gastro-esophageal reflux disease without esophagitis: Secondary | ICD-10-CM | POA: Diagnosis not present

## 2022-01-08 DIAGNOSIS — N1831 Chronic kidney disease, stage 3a: Secondary | ICD-10-CM | POA: Diagnosis not present

## 2022-01-08 DIAGNOSIS — J9611 Chronic respiratory failure with hypoxia: Secondary | ICD-10-CM | POA: Diagnosis not present

## 2022-01-08 DIAGNOSIS — Z9181 History of falling: Secondary | ICD-10-CM | POA: Diagnosis not present

## 2022-01-08 DIAGNOSIS — N3281 Overactive bladder: Secondary | ICD-10-CM | POA: Diagnosis not present

## 2022-01-08 DIAGNOSIS — E782 Mixed hyperlipidemia: Secondary | ICD-10-CM | POA: Diagnosis not present

## 2022-01-08 DIAGNOSIS — F3340 Major depressive disorder, recurrent, in remission, unspecified: Secondary | ICD-10-CM | POA: Diagnosis not present

## 2022-01-08 DIAGNOSIS — I130 Hypertensive heart and chronic kidney disease with heart failure and stage 1 through stage 4 chronic kidney disease, or unspecified chronic kidney disease: Secondary | ICD-10-CM | POA: Diagnosis not present

## 2022-01-08 DIAGNOSIS — E039 Hypothyroidism, unspecified: Secondary | ICD-10-CM | POA: Diagnosis not present

## 2022-01-08 DIAGNOSIS — I5042 Chronic combined systolic (congestive) and diastolic (congestive) heart failure: Secondary | ICD-10-CM | POA: Diagnosis not present

## 2022-01-08 DIAGNOSIS — Z7982 Long term (current) use of aspirin: Secondary | ICD-10-CM | POA: Diagnosis not present

## 2022-01-08 DIAGNOSIS — Z951 Presence of aortocoronary bypass graft: Secondary | ICD-10-CM | POA: Diagnosis not present

## 2022-01-08 DIAGNOSIS — E1122 Type 2 diabetes mellitus with diabetic chronic kidney disease: Secondary | ICD-10-CM | POA: Diagnosis not present

## 2022-01-08 DIAGNOSIS — I2510 Atherosclerotic heart disease of native coronary artery without angina pectoris: Secondary | ICD-10-CM | POA: Diagnosis not present

## 2022-01-08 DIAGNOSIS — Z9981 Dependence on supplemental oxygen: Secondary | ICD-10-CM | POA: Diagnosis not present

## 2022-01-08 DIAGNOSIS — M170 Bilateral primary osteoarthritis of knee: Secondary | ICD-10-CM | POA: Diagnosis not present

## 2022-01-08 DIAGNOSIS — Z853 Personal history of malignant neoplasm of breast: Secondary | ICD-10-CM | POA: Diagnosis not present

## 2022-01-08 DIAGNOSIS — Z8673 Personal history of transient ischemic attack (TIA), and cerebral infarction without residual deficits: Secondary | ICD-10-CM | POA: Diagnosis not present

## 2022-01-08 DIAGNOSIS — M810 Age-related osteoporosis without current pathological fracture: Secondary | ICD-10-CM | POA: Diagnosis not present

## 2022-01-09 DIAGNOSIS — N1831 Chronic kidney disease, stage 3a: Secondary | ICD-10-CM | POA: Diagnosis not present

## 2022-01-09 DIAGNOSIS — E1122 Type 2 diabetes mellitus with diabetic chronic kidney disease: Secondary | ICD-10-CM | POA: Diagnosis not present

## 2022-01-09 DIAGNOSIS — I129 Hypertensive chronic kidney disease with stage 1 through stage 4 chronic kidney disease, or unspecified chronic kidney disease: Secondary | ICD-10-CM | POA: Diagnosis not present

## 2022-01-09 DIAGNOSIS — M810 Age-related osteoporosis without current pathological fracture: Secondary | ICD-10-CM | POA: Diagnosis not present

## 2022-01-09 DIAGNOSIS — I5042 Chronic combined systolic (congestive) and diastolic (congestive) heart failure: Secondary | ICD-10-CM | POA: Diagnosis not present

## 2022-01-09 DIAGNOSIS — I130 Hypertensive heart and chronic kidney disease with heart failure and stage 1 through stage 4 chronic kidney disease, or unspecified chronic kidney disease: Secondary | ICD-10-CM | POA: Diagnosis not present

## 2022-01-16 ENCOUNTER — Inpatient Hospital Stay (HOSPITAL_COMMUNITY)
Admission: EM | Admit: 2022-01-16 | Discharge: 2022-02-09 | DRG: 291 | Disposition: E | Payer: Medicare HMO | Attending: Internal Medicine | Admitting: Internal Medicine

## 2022-01-16 ENCOUNTER — Emergency Department (HOSPITAL_COMMUNITY): Payer: Medicare HMO

## 2022-01-16 ENCOUNTER — Encounter (HOSPITAL_COMMUNITY): Payer: Self-pay | Admitting: Cardiology

## 2022-01-16 DIAGNOSIS — J449 Chronic obstructive pulmonary disease, unspecified: Secondary | ICD-10-CM | POA: Diagnosis not present

## 2022-01-16 DIAGNOSIS — J849 Interstitial pulmonary disease, unspecified: Secondary | ICD-10-CM | POA: Diagnosis not present

## 2022-01-16 DIAGNOSIS — Z79899 Other long term (current) drug therapy: Secondary | ICD-10-CM

## 2022-01-16 DIAGNOSIS — E872 Acidosis, unspecified: Secondary | ICD-10-CM | POA: Diagnosis present

## 2022-01-16 DIAGNOSIS — I493 Ventricular premature depolarization: Secondary | ICD-10-CM | POA: Diagnosis not present

## 2022-01-16 DIAGNOSIS — R001 Bradycardia, unspecified: Secondary | ICD-10-CM | POA: Diagnosis not present

## 2022-01-16 DIAGNOSIS — J9621 Acute and chronic respiratory failure with hypoxia: Secondary | ICD-10-CM | POA: Diagnosis not present

## 2022-01-16 DIAGNOSIS — Z888 Allergy status to other drugs, medicaments and biological substances status: Secondary | ICD-10-CM

## 2022-01-16 DIAGNOSIS — I2722 Pulmonary hypertension due to left heart disease: Secondary | ICD-10-CM | POA: Diagnosis not present

## 2022-01-16 DIAGNOSIS — I071 Rheumatic tricuspid insufficiency: Secondary | ICD-10-CM | POA: Diagnosis present

## 2022-01-16 DIAGNOSIS — Z8249 Family history of ischemic heart disease and other diseases of the circulatory system: Secondary | ICD-10-CM

## 2022-01-16 DIAGNOSIS — I5031 Acute diastolic (congestive) heart failure: Secondary | ICD-10-CM

## 2022-01-16 DIAGNOSIS — D72829 Elevated white blood cell count, unspecified: Secondary | ICD-10-CM | POA: Diagnosis present

## 2022-01-16 DIAGNOSIS — R570 Cardiogenic shock: Secondary | ICD-10-CM | POA: Diagnosis not present

## 2022-01-16 DIAGNOSIS — I5023 Acute on chronic systolic (congestive) heart failure: Secondary | ICD-10-CM | POA: Diagnosis not present

## 2022-01-16 DIAGNOSIS — E8729 Other acidosis: Secondary | ICD-10-CM | POA: Diagnosis not present

## 2022-01-16 DIAGNOSIS — I2781 Cor pulmonale (chronic): Secondary | ICD-10-CM | POA: Diagnosis present

## 2022-01-16 DIAGNOSIS — Z743 Need for continuous supervision: Secondary | ICD-10-CM | POA: Diagnosis not present

## 2022-01-16 DIAGNOSIS — E063 Autoimmune thyroiditis: Secondary | ICD-10-CM | POA: Diagnosis present

## 2022-01-16 DIAGNOSIS — Z951 Presence of aortocoronary bypass graft: Secondary | ICD-10-CM | POA: Diagnosis not present

## 2022-01-16 DIAGNOSIS — I110 Hypertensive heart disease with heart failure: Principal | ICD-10-CM | POA: Diagnosis present

## 2022-01-16 DIAGNOSIS — I5082 Biventricular heart failure: Secondary | ICD-10-CM | POA: Diagnosis present

## 2022-01-16 DIAGNOSIS — I5084 End stage heart failure: Secondary | ICD-10-CM | POA: Diagnosis present

## 2022-01-16 DIAGNOSIS — Z66 Do not resuscitate: Secondary | ICD-10-CM | POA: Diagnosis not present

## 2022-01-16 DIAGNOSIS — Z7982 Long term (current) use of aspirin: Secondary | ICD-10-CM

## 2022-01-16 DIAGNOSIS — Z7189 Other specified counseling: Secondary | ICD-10-CM | POA: Diagnosis not present

## 2022-01-16 DIAGNOSIS — E871 Hypo-osmolality and hyponatremia: Secondary | ICD-10-CM | POA: Diagnosis not present

## 2022-01-16 DIAGNOSIS — R7989 Other specified abnormal findings of blood chemistry: Secondary | ICD-10-CM

## 2022-01-16 DIAGNOSIS — I452 Bifascicular block: Secondary | ICD-10-CM | POA: Diagnosis present

## 2022-01-16 DIAGNOSIS — T447X5A Adverse effect of beta-adrenoreceptor antagonists, initial encounter: Secondary | ICD-10-CM | POA: Diagnosis not present

## 2022-01-16 DIAGNOSIS — N179 Acute kidney failure, unspecified: Secondary | ICD-10-CM

## 2022-01-16 DIAGNOSIS — E876 Hypokalemia: Secondary | ICD-10-CM | POA: Diagnosis not present

## 2022-01-16 DIAGNOSIS — D751 Secondary polycythemia: Secondary | ICD-10-CM | POA: Diagnosis present

## 2022-01-16 DIAGNOSIS — E1165 Type 2 diabetes mellitus with hyperglycemia: Secondary | ICD-10-CM | POA: Diagnosis not present

## 2022-01-16 DIAGNOSIS — Z515 Encounter for palliative care: Secondary | ICD-10-CM

## 2022-01-16 DIAGNOSIS — I420 Dilated cardiomyopathy: Secondary | ICD-10-CM | POA: Diagnosis not present

## 2022-01-16 DIAGNOSIS — J9811 Atelectasis: Secondary | ICD-10-CM | POA: Diagnosis not present

## 2022-01-16 DIAGNOSIS — I2510 Atherosclerotic heart disease of native coronary artery without angina pectoris: Secondary | ICD-10-CM | POA: Diagnosis present

## 2022-01-16 DIAGNOSIS — Z853 Personal history of malignant neoplasm of breast: Secondary | ICD-10-CM

## 2022-01-16 DIAGNOSIS — K5900 Constipation, unspecified: Secondary | ICD-10-CM | POA: Diagnosis not present

## 2022-01-16 DIAGNOSIS — Z8673 Personal history of transient ischemic attack (TIA), and cerebral infarction without residual deficits: Secondary | ICD-10-CM

## 2022-01-16 DIAGNOSIS — K219 Gastro-esophageal reflux disease without esophagitis: Secondary | ICD-10-CM | POA: Diagnosis present

## 2022-01-16 DIAGNOSIS — I255 Ischemic cardiomyopathy: Secondary | ICD-10-CM | POA: Diagnosis present

## 2022-01-16 DIAGNOSIS — Z87891 Personal history of nicotine dependence: Secondary | ICD-10-CM

## 2022-01-16 DIAGNOSIS — Z923 Personal history of irradiation: Secondary | ICD-10-CM

## 2022-01-16 DIAGNOSIS — I5020 Unspecified systolic (congestive) heart failure: Secondary | ICD-10-CM | POA: Diagnosis not present

## 2022-01-16 DIAGNOSIS — I959 Hypotension, unspecified: Secondary | ICD-10-CM | POA: Diagnosis not present

## 2022-01-16 DIAGNOSIS — Z9981 Dependence on supplemental oxygen: Secondary | ICD-10-CM

## 2022-01-16 DIAGNOSIS — E875 Hyperkalemia: Secondary | ICD-10-CM | POA: Diagnosis present

## 2022-01-16 DIAGNOSIS — E785 Hyperlipidemia, unspecified: Secondary | ICD-10-CM | POA: Diagnosis not present

## 2022-01-16 DIAGNOSIS — J90 Pleural effusion, not elsewhere classified: Secondary | ICD-10-CM | POA: Diagnosis not present

## 2022-01-16 DIAGNOSIS — Z79890 Hormone replacement therapy (postmenopausal): Secondary | ICD-10-CM

## 2022-01-16 DIAGNOSIS — I4510 Unspecified right bundle-branch block: Secondary | ICD-10-CM | POA: Diagnosis present

## 2022-01-16 DIAGNOSIS — R0902 Hypoxemia: Secondary | ICD-10-CM | POA: Diagnosis not present

## 2022-01-16 DIAGNOSIS — I25810 Atherosclerosis of coronary artery bypass graft(s) without angina pectoris: Secondary | ICD-10-CM

## 2022-01-16 DIAGNOSIS — Z7984 Long term (current) use of oral hypoglycemic drugs: Secondary | ICD-10-CM

## 2022-01-16 DIAGNOSIS — R54 Age-related physical debility: Secondary | ICD-10-CM | POA: Diagnosis present

## 2022-01-16 DIAGNOSIS — Z825 Family history of asthma and other chronic lower respiratory diseases: Secondary | ICD-10-CM

## 2022-01-16 LAB — COMPREHENSIVE METABOLIC PANEL
ALT: 57 U/L — ABNORMAL HIGH (ref 0–44)
AST: 42 U/L — ABNORMAL HIGH (ref 15–41)
Albumin: 3.7 g/dL (ref 3.5–5.0)
Alkaline Phosphatase: 51 U/L (ref 38–126)
Anion gap: 19 — ABNORMAL HIGH (ref 5–15)
BUN: 81 mg/dL — ABNORMAL HIGH (ref 8–23)
CO2: 17 mmol/L — ABNORMAL LOW (ref 22–32)
Calcium: 10.9 mg/dL — ABNORMAL HIGH (ref 8.9–10.3)
Chloride: 99 mmol/L (ref 98–111)
Creatinine, Ser: 2.11 mg/dL — ABNORMAL HIGH (ref 0.44–1.00)
GFR, Estimated: 21 mL/min — ABNORMAL LOW (ref >60)
Glucose, Bld: 232 mg/dL — ABNORMAL HIGH (ref 70–99)
Potassium: 6.8 mmol/L (ref 3.5–5.1)
Sodium: 135 mmol/L (ref 135–145)
Total Bilirubin: 0.7 mg/dL (ref 0.3–1.2)
Total Protein: 6 g/dL — ABNORMAL LOW (ref 6.5–8.1)

## 2022-01-16 LAB — CBC WITH DIFFERENTIAL/PLATELET
Abs Immature Granulocytes: 0.05 10*3/uL (ref 0.00–0.07)
Basophils Absolute: 0 10*3/uL (ref 0.0–0.1)
Basophils Relative: 0 %
Eosinophils Absolute: 0 10*3/uL (ref 0.0–0.5)
Eosinophils Relative: 0 %
HCT: 48.9 % — ABNORMAL HIGH (ref 36.0–46.0)
Hemoglobin: 16.5 g/dL — ABNORMAL HIGH (ref 12.0–15.0)
Immature Granulocytes: 1 %
Lymphocytes Relative: 5 %
Lymphs Abs: 0.5 10*3/uL — ABNORMAL LOW (ref 0.7–4.0)
MCH: 36.2 pg — ABNORMAL HIGH (ref 26.0–34.0)
MCHC: 33.7 g/dL (ref 30.0–36.0)
MCV: 107.2 fL — ABNORMAL HIGH (ref 80.0–100.0)
Monocytes Absolute: 0.7 10*3/uL (ref 0.1–1.0)
Monocytes Relative: 6 %
Neutro Abs: 9.6 10*3/uL — ABNORMAL HIGH (ref 1.7–7.7)
Neutrophils Relative %: 88 %
Platelets: 179 10*3/uL (ref 150–400)
RBC: 4.56 MIL/uL (ref 3.87–5.11)
RDW: 14.3 % (ref 11.5–15.5)
WBC: 10.9 10*3/uL — ABNORMAL HIGH (ref 4.0–10.5)
nRBC: 0 % (ref 0.0–0.2)

## 2022-01-16 LAB — BRAIN NATRIURETIC PEPTIDE
B Natriuretic Peptide: 3526.7 pg/mL — ABNORMAL HIGH (ref 0.0–100.0)
B Natriuretic Peptide: 2830.5 pg/mL — ABNORMAL HIGH (ref 0.0–100.0)

## 2022-01-16 LAB — CREATININE, SERUM
Creatinine, Ser: 2.1 mg/dL — ABNORMAL HIGH (ref 0.44–1.00)
GFR, Estimated: 21 mL/min — ABNORMAL LOW (ref >60)

## 2022-01-16 LAB — CBC
HCT: 49 % — ABNORMAL HIGH (ref 36.0–46.0)
Hemoglobin: 16 g/dL — ABNORMAL HIGH (ref 12.0–15.0)
MCH: 35.1 pg — ABNORMAL HIGH (ref 26.0–34.0)
MCHC: 32.7 g/dL (ref 30.0–36.0)
MCV: 107.5 fL — ABNORMAL HIGH (ref 80.0–100.0)
Platelets: 119 10*3/uL — ABNORMAL LOW (ref 150–400)
RBC: 4.56 MIL/uL (ref 3.87–5.11)
RDW: 14.4 % (ref 11.5–15.5)
WBC: 11.1 10*3/uL — ABNORMAL HIGH (ref 4.0–10.5)
nRBC: 0 % (ref 0.0–0.2)

## 2022-01-16 LAB — MAGNESIUM: Magnesium: 2 mg/dL (ref 1.7–2.4)

## 2022-01-16 LAB — CBG MONITORING, ED: Glucose-Capillary: 201 mg/dL — ABNORMAL HIGH (ref 70–99)

## 2022-01-16 LAB — TROPONIN I (HIGH SENSITIVITY)
Troponin I (High Sensitivity): 66 ng/L — ABNORMAL HIGH (ref <18)
Troponin I (High Sensitivity): 61 ng/L — ABNORMAL HIGH (ref <18)

## 2022-01-16 LAB — HEMOGLOBIN A1C
Hgb A1c MFr Bld: 8.2 % — ABNORMAL HIGH (ref 4.8–5.6)
Mean Plasma Glucose: 188.64 mg/dL

## 2022-01-16 LAB — TSH: TSH: 32.325 u[IU]/mL — ABNORMAL HIGH (ref 0.350–4.500)

## 2022-01-16 MED ORDER — SODIUM BICARBONATE 8.4 % IV SOLN
50.0000 meq | Freq: Once | INTRAVENOUS | Status: AC
  Administered 2022-01-17: 50 meq via INTRAVENOUS
  Filled 2022-01-16: qty 50

## 2022-01-16 MED ORDER — INSULIN ASPART 100 UNIT/ML IV SOLN
10.0000 [IU] | Freq: Once | INTRAVENOUS | Status: AC
  Administered 2022-01-17: 10 [IU] via INTRAVENOUS

## 2022-01-16 MED ORDER — DOCUSATE SODIUM 100 MG PO CAPS
100.0000 mg | ORAL_CAPSULE | Freq: Two times a day (BID) | ORAL | Status: DC
  Administered 2022-01-17 – 2022-01-20 (×8): 100 mg via ORAL
  Filled 2022-01-16 (×9): qty 1

## 2022-01-16 MED ORDER — SODIUM CHLORIDE 0.9 % IV BOLUS
500.0000 mL | Freq: Once | INTRAVENOUS | Status: AC
  Administered 2022-01-16: 500 mL via INTRAVENOUS

## 2022-01-16 MED ORDER — SODIUM ZIRCONIUM CYCLOSILICATE 10 G PO PACK
10.0000 g | PACK | Freq: Once | ORAL | Status: AC
  Administered 2022-01-17: 10 g via ORAL
  Filled 2022-01-16: qty 1

## 2022-01-16 MED ORDER — DOPAMINE-DEXTROSE 3.2-5 MG/ML-% IV SOLN
INTRAVENOUS | Status: AC
  Administered 2022-01-16: 5 ug/kg/min via INTRAVENOUS
  Filled 2022-01-16: qty 250

## 2022-01-16 MED ORDER — INSULIN ASPART 100 UNIT/ML IJ SOLN
0.0000 [IU] | Freq: Three times a day (TID) | INTRAMUSCULAR | Status: DC
  Administered 2022-01-17 – 2022-01-18 (×4): 3 [IU] via SUBCUTANEOUS

## 2022-01-16 MED ORDER — CALCIUM GLUCONATE-NACL 1-0.675 GM/50ML-% IV SOLN: 1.0000 g | Freq: Once | INTRAVENOUS | Status: DC

## 2022-01-16 MED ORDER — ATORVASTATIN CALCIUM 80 MG PO TABS
80.0000 mg | ORAL_TABLET | Freq: Every day | ORAL | Status: DC
  Administered 2022-01-16 – 2022-01-20 (×5): 80 mg via ORAL
  Filled 2022-01-16: qty 1
  Filled 2022-01-16: qty 2
  Filled 2022-01-16 (×3): qty 1

## 2022-01-16 MED ORDER — INSULIN ASPART 100 UNIT/ML IJ SOLN
0.0000 [IU] | Freq: Every day | INTRAMUSCULAR | Status: DC
  Administered 2022-01-16: 2 [IU] via SUBCUTANEOUS

## 2022-01-16 MED ORDER — HEPARIN SODIUM (PORCINE) 5000 UNIT/ML IJ SOLN
5000.0000 [IU] | Freq: Three times a day (TID) | INTRAMUSCULAR | Status: DC
  Administered 2022-01-16 – 2022-01-21 (×14): 5000 [IU] via SUBCUTANEOUS
  Filled 2022-01-16 (×13): qty 1

## 2022-01-16 MED ORDER — ASPIRIN 81 MG PO TBEC
81.0000 mg | DELAYED_RELEASE_TABLET | Freq: Every day | ORAL | Status: DC
  Administered 2022-01-16 – 2022-01-20 (×5): 81 mg via ORAL
  Filled 2022-01-16 (×5): qty 1

## 2022-01-16 MED ORDER — FUROSEMIDE 10 MG/ML IJ SOLN
20.0000 mg | Freq: Once | INTRAMUSCULAR | Status: AC
  Administered 2022-01-16: 20 mg via INTRAVENOUS
  Filled 2022-01-16: qty 2

## 2022-01-16 MED ORDER — ACETAMINOPHEN 500 MG PO TABS: 1000.0000 mg | ORAL_TABLET | ORAL | Status: DC

## 2022-01-16 MED ORDER — SODIUM CHLORIDE 0.9 % IV SOLN: INTRAVENOUS | Status: DC

## 2022-01-16 MED ORDER — DEXTROSE 50 % IV SOLN
1.0000 | Freq: Once | INTRAVENOUS | Status: AC
  Administered 2022-01-17: 50 mL via INTRAVENOUS
  Filled 2022-01-16: qty 50

## 2022-01-16 MED ORDER — DOPAMINE-DEXTROSE 3.2-5 MG/ML-% IV SOLN
5.0000 ug/kg/min | INTRAVENOUS | Status: DC
  Administered 2022-01-18: 5 ug/kg/min via INTRAVENOUS
  Filled 2022-01-16 (×2): qty 250

## 2022-01-16 MED ORDER — ASPIRIN 81 MG PO TBEC: 81.0000 mg | DELAYED_RELEASE_TABLET | Freq: Every day | ORAL | Status: DC

## 2022-01-16 MED ORDER — LEVOTHYROXINE SODIUM 75 MCG PO TABS: 150.0000 ug | ORAL_TABLET | Freq: Every day | ORAL | Status: DC

## 2022-01-16 MED ORDER — ACETAMINOPHEN 500 MG PO TABS: 500.0000 mg | ORAL_TABLET | Freq: Four times a day (QID) | ORAL | Status: DC | PRN

## 2022-01-16 NOTE — Consult Note (Deleted)
Cardiology COnsult     Patient ID: Marissa Jefferson MRN: [id number]; DOB: [birth year]   Admission date: 01/15/2022  PCP:  Merrilee Seashore, MD   [facility] Cardiologist:  Sanda Klein, MD        Chief Complaint:  weakness  Patient Profile:   Marissa Jefferson is a 88 y.o. female with CAD -CABG 0177, chronic diastolic CHF, post op a fib without subsequent recurrence , HTN, DM-2, HLD, DM-2.Lt breast cancer with surgery and radiation who is being seen 01/29/2022 for the evaluation of sever bradycardia.  History of Present Illness:   Marissa Jefferson  with hx as above and EF in 2015 20-25% and NSVT, post CABG ( LIMA to LAD, VG to PDA, seq VG to OM1 and OM2 ) EF to 55%.  Hx of diastolic CHF in 11/3901 and again 06/2021, diuresed both times. She has been on amiodarone in past.  Her daughter is a Software engineer. Pt on 02 2 L at home.  Baseline wt 118  --home meds in May 2023 coreg 25 BID, no longer on amiodarone, + amlodipine 5 mg  Echo 07/2021  EF 55-60%, no RWMA, moderate concentric LVH, G2 DD  There is the interventricular septum is flattened  in systole and diastole, consistent with right ventricular pressure and  volume overload.  RV systolic function is severely reduced, RV size is mildly enlarged.  There is severely elevated pulmonary artery  systolic pressure. The estimated right ventricular systolic pressure is 00.9 mmHg. LA and RA mod. Dilated.  Mild MR no AR, no AS    Now presents with decreased sp02 at home 57% on RA, HR 26 BP 82/40.  She was given 400 cc NS, and with no response 1 mg IV atropine with HR improved to 50s after 1 mg.    EKG:  The ECG that was done 02/07/2022 was personally reviewed and demonstrates junctional vs atrial fib with RBBB (chronic) and LAFB HR 53 follow up with ST changes of post MI and read as atrial fib though P waves present at times. PCXR IMPRESSION: Mild vascular congestion possible mild fluid overload  Bilateral pleural effusions and left lower lobe  atelectasis.  BP 92/46 P 53 R 13-19  T 97.4  sp02 on RA 96-92%      Past Medical History:  Diagnosis Date   Acute CHF (congestive heart failure) ([place]) 06/17/2020   Arthritis    "knees" (10/08/2013)   Breast cancer ([place])    "left; I had 62 sessions of radiation"   CAD (coronary artery disease), native coronary artery 10/06/2013   LAD 90%, CFX 90%, RCA 100% w/ collat   CHF (congestive heart failure) ([place]) 06/17/2020   Chronic combined systolic and diastolic CHF, NYHA class 2 ([place]) 09/2013   CVA (cerebral vascular accident) ([place]) 1968   leaving no deficit   DM2 (diabetes mellitus, type 2) ([facility])    GERD (gastroesophageal reflux disease)    Hyperlipidemia    Hypertension    Hypothyroidism    Ischemic dilated cardiomyopathy ([place]) 09/2013   EF 20-25% by echo   NSVT (nonsustained ventricular tachycardia) ([place]) 09/2013   OAB (overactive bladder)    Osteopenia    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) 05/10/2014    Past Surgical History:  Procedure Laterality Date   BREAST LUMPECTOMY Left 1998   BREAST LUMPECTOMY WITH AXILLARY LYMPH NODE DISSECTION Left 1998   CARDIAC CATHETERIZATION  10/06/2013   CATARACT EXTRACTION W/ INTRAOCULAR LENS  IMPLANT, BILATERAL Bilateral ~  2009   COLONOSCOPY  2005   CORONARY ARTERY BYPASS GRAFT [name] 10/09/2013   Procedure: CORONARY ARTERY BYPASS GRAFTING (CABG) x 4 using left internal mammary artery and right saphenous leg vein using endoscope.;  Surgeon: Melrose Nakayama, MD;  Location: [facility];  Service: Open Heart Surgery;  Laterality: [name];   INTRAOPERATIVE TRANSESOPHAGEAL ECHOCARDIOGRAM [name] 10/09/2013   Procedure: INTRAOPERATIVE TRANSESOPHAGEAL ECHOCARDIOGRAM;  Surgeon: Melrose Nakayama, MD;  Location: [facility];  Service: Open Heart Surgery;  Laterality: [name];   LEFT AND RIGHT HEART CATHETERIZATION WITH CORONARY ANGIOGRAM [name] 10/06/2013   Procedure: LEFT AND RIGHT HEART CATHETERIZATION WITH CORONARY ANGIOGRAM;  Surgeon:  Sanda Klein, MD;  Location: [facility] CATH LAB;  Service: Cardiovascular;  Laterality: [name];   TONSILLECTOMY AND ADENOIDECTOMY  1930's     Medications Prior to Admission: Prior to Admission medications   Medication Sig Start Date End Date Taking? Authorizing Provider  acetaminophen (TYLENOL) 500 MG tablet Take 1,000 mg by mouth See admin instructions. Take 1,000 mg by mouth in the morning & at bedtime and an additional 500 mg once a day as needed for pain/discomfort    [provider]  amLODipine (NORVASC) 5 MG tablet Take 1 tablet (5 mg total) by mouth daily. 07/14/21   [name], MD  aspirin EC 81 MG tablet Take 1 tablet (81 mg total) by mouth daily. 01/23/19   Croitoru, Mihai, MD  atorvastatin (LIPITOR) 80 MG tablet Take 80 mg by mouth daily. 10/04/13   [provider]  B Complex-Biotin-FA (SUPER B-50 COMPLEX PO) Take 1 tablet by mouth daily before lunch.    [provider]  Biotin 5000 MCG SUBL Place 5,000 mcg under the tongue daily.    [provider]  carvedilol (COREG) 25 MG tablet TAKE 1 TABLET BY MOUTH TWICE A DAY WITH MEALS Patient taking differently: Take 25 mg by mouth 2 (two) times daily with a meal. 05/15/21   Croitoru, Mihai, MD  Cholecalciferol (VITAMIN D3) 125 MCG (5000 UT) CAPS Take 5,000 Units by mouth daily before lunch.    [provider]  Cyanocobalamin (VITAMIN B-12) 5000 MCG SUBL Place 5,000 mcg under the tongue daily before lunch.    [provider]  denosumab (PROLIA) 60 MG/ML SOSY injection Inject 60 mg into the skin every 6 (six) months.    [provider]  docusate sodium (COLACE) 100 MG capsule Take 100 mg by mouth 2 (two) times daily.    [provider]  furosemide (LASIX) 20 MG tablet Take 20 mg daily, add an additional 20 mg of days of weight gain over 120 lb or leg swelling 07/20/21   Warren Lacy, PA-C  levothyroxine (SYNTHROID, LEVOTHROID) 150 MCG tablet Take 150 mcg by mouth daily before  breakfast. 12/06/14   [provider]  magnesium gluconate (MAGONATE) 500 MG tablet Take 500 mg by mouth every evening.    [provider]  metFORMIN (GLUCOPHAGE) 500 MG tablet Take 1,000 mg by mouth 2 (two) times daily.    [provider]  OXYGEN Inhale 2-3 L into the lungs continuous.    [provider]  potassium chloride (KLOR-CON M) 10 MEQ tablet Take 10 mEq by mouth daily.    [provider]  protein supplement shake (PREMIER PROTEIN) LIQD Take 237 mLs by mouth in the morning.    [provider]  sacubitril-valsartan (ENTRESTO) 24-26 MG Take 1 tablet by mouth 2 (two) times daily. 07/13/21   [name], MD  solifenacin ([place]) 10  MG tablet Take 10 mg by mouth at bedtime.    [provider]  VOLTAREN 1 % GEL Apply 2 g topically 2 (two) times daily as needed (for bilateral knee pain). 09/02/13   [provider]  potassium chloride (K-DUR) 10 MEQ tablet Take 0.5 tablets (5 mEq total) by mouth daily. 09/28/14 02/25/15  Croitoru, Dani Gobble, MD     Allergies:    Allergies  Allergen Reactions   Amiodarone Other (See Comments)    Bradycardia   Crestor [Rosuvastatin] Nausea Only   Sitagliptin Other (See Comments)    Caused urinary frequency    Social History:   Social History   Socioeconomic History   Marital status: Widowed    Spouse name: Not on file   Number of children: 2   Years of education: Not on file   Highest education level: High school graduate  Occupational History   Occupation: [employer]  Tobacco Use   Smoking status: Former    Packs/day: 0.25    Years: 3.00    Total pack years: 0.75    Types: Cigarettes    Quit date: 03/12/1948    Years since quitting: 73.8   Smokeless tobacco: Never   Tobacco comments:    smoked in college   Vaping Use   Vaping Use: Never used  Substance and Sexual Activity   Alcohol use: No   Drug use: No   Sexual activity: Not Currently  Other Topics Concern   Not on  file  Social History Narrative   Not on file   Social Determinants of Health   Financial Resource Strain: Not on file  Food Insecurity: No Food Insecurity (07/11/2021)   Hunger Vital Sign    Worried About Running Out of Food in the Last Year: Never true    [place] in the Last Year: Never true  Transportation Needs: No Transportation Needs (07/11/2021)   PRAPARE - Hydrologist (Medical): No    Lack of Transportation (Non-Medical): No  Physical Activity: Not on file  Stress: Not on file  Social Connections: Not on file  Intimate Partner Violence: Not on file    Family History:   The patient's family history includes Asthma in her daughter and son; Cancer in her father; Colon cancer in her mother; Heart attack in her father.    ROS:  Please see the history of present illness.  General:no colds or fevers, no weight changes + weakness Skin:no rashes or ulcers HEENT:no blurred vision, no congestion CV:see HPI PUL:see HPI GI:no diarrhea constipation or melena, no indigestion GU:no hematuria, no dysuria MS:no joint pain, no claudication Neuro:no syncope, no lightheadedness Endo:+ diabetes CBG 217 , no thyroid disease All other ROS reviewed and negative.     Physical Exam/Data:   Vitals:   01/17/2022 1925 02/04/2022 1929 01/24/2022 1930  BP: (!) 81/51 (!) 81/51 (!) 79/49  Pulse:  (!) 53 86  Resp:  18 19  Temp:  (!) 97.4 F (36.3 C)   TempSrc:  Oral   SpO2:  100% 100%   No intake or output data in the 24 hours ending 01/31/2022 1948    07/20/2021   11:29 AM 07/12/2021    5:00 AM 07/11/2021    2:57 AM  Last 3 Weights  Weight (lbs) 122 lb 12.8 oz 128 lb 1.4 oz 129 lb 6.6 oz  Weight (kg) 55.702 kg 58.1 kg 58.7 kg     There is no height or weight on  file to calculate BMI.   Exam per Dr. Dellia Cloud     Relevant CV Studies:  Echo 07/10/21 IMPRESSIONS     1. Left ventricular ejection fraction, by estimation, is 55 to 60%. The  left ventricle  has normal function. The left ventricle has no regional  wall motion abnormalities. There is moderate concentric left ventricular  hypertrophy. Left ventricular  diastolic parameters are consistent with Grade II diastolic dysfunction  (pseudonormalization). There is the interventricular septum is flattened  in systole and diastole, consistent with right ventricular pressure and  volume overload.   2. Right ventricular systolic function is severely reduced. The right  ventricular size is mildly enlarged. Moderately increased right  ventricular wall thickness. There is severely elevated pulmonary artery  systolic pressure. The estimated right  ventricular systolic pressure is 41.6 mmHg.   3. Left atrial size was moderately dilated.   4. Right atrial size was moderately dilated.   5. The mitral valve is abnormal. Mild mitral valve regurgitation. No  evidence of mitral stenosis.   6. The aortic valve is tricuspid. Aortic valve regurgitation is not  visualized. Aortic valve sclerosis/calcification is present, without any  evidence of aortic stenosis.   7. The inferior vena cava is dilated in size with >50% respiratory  variability, suggesting right atrial pressure of 8 mmHg.   8. Cannot exclude a small PFO.   Comparison(s): No significant change from prior study.   Conclusion(s)/Recommendation(s): Severe pulmonary hypertension with RV  pressure/volume overload.   FINDINGS   Left Ventricle: Left ventricular ejection fraction, by estimation, is 55  to 60%. The left ventricle has normal function. The left ventricle has no  regional wall motion abnormalities. The left ventricular internal cavity  size was normal in size. There is   moderate concentric left ventricular hypertrophy. The interventricular  septum is flattened in systole and diastole, consistent with right  ventricular pressure and volume overload. Left ventricular diastolic  parameters are consistent with Grade II  diastolic  dysfunction (pseudonormalization).   Right Ventricle: The right ventricular size is mildly enlarged. Moderately  increased right ventricular wall thickness. Right ventricular systolic  function is severely reduced. There is severely elevated pulmonary artery  systolic pressure. The tricuspid  regurgitant velocity is 3.73 m/s, and with an assumed right atrial  pressure of 8 mmHg, the estimated right ventricular systolic pressure is  60.6 mmHg.   Left Atrium: Left atrial size was moderately dilated.   Right Atrium: Right atrial size was moderately dilated.   Pericardium: Trivial pericardial effusion is present.   Mitral Valve: The mitral valve is abnormal. There is mild thickening of  the mitral valve leaflet(s). There is mild calcification of the mitral  valve leaflet(s). Mild mitral valve regurgitation. No evidence of mitral  valve stenosis.   Tricuspid Valve: The tricuspid valve is normal in structure. Tricuspid  valve regurgitation is mild . No evidence of tricuspid stenosis.   Aortic Valve: Partially fused NCC and LCC. The aortic valve is tricuspid.  Aortic valve regurgitation is not visualized. Aortic valve  sclerosis/calcification is present, without any evidence of aortic  stenosis. Aortic valve peak gradient measures 12.1  mmHg.   Pulmonic Valve: The pulmonic valve was grossly normal. Pulmonic valve  regurgitation is mild. No evidence of pulmonic stenosis.   Aorta: The aortic root, ascending aorta, aortic arch and descending aorta  are all structurally normal, with no evidence of dilitation or  obstruction.   Venous: The inferior vena cava is dilated in size with greater  than 50%  respiratory variability, suggesting right atrial pressure of 8 mmHg.   Echocardiogram 06/18/2020    1. Left ventricular ejection fraction, by estimation, is 55 to 60%. The  left ventricle has normal function. The left ventricle has no regional  wall motion abnormalities. There is moderate  left ventricular hypertrophy.  Left ventricular diastolic  parameters are consistent with Grade II diastolic dysfunction  (pseudonormalization). Elevated left ventricular end-diastolic pressure.  There is the interventricular septum is flattened in diastole ('D' shaped  left ventricle), consistent with right  ventricular volume overload and the interventricular septum is flattened  in systole, consistent with right ventricular pressure overload.   2. Right ventricular systolic function is severely reduced. The right  ventricular size is mildly enlarged. There is severely elevated pulmonary  artery systolic pressure. The estimated right ventricular systolic  pressure is 16.1 mmHg.   3. Left atrial size was mildly dilated.   4. Large pleural effusion in the left lateral region.   5. The mitral valve is grossly normal. Mild mitral valve regurgitation.   6. Tricuspid valve regurgitation is moderate.   7. The NCC and the LCC are fused. . The aortic valve is tricuspid. Aortic  valve regurgitation is not visualized. Mild aortic valve stenosis.   8. The inferior vena cava is dilated in size with <50% respiratory  variability, suggesting right atrial pressure of 15 mmHg.     Laboratory Data:  High Sensitivity Troponin:  No results for input(s): "TROPONINIHS" in the last 720 hours.    ChemistryNo results for input(s): "NA", "K", "CL", "CO2", "GLUCOSE", "BUN", "CREATININE", "CALCIUM", "MG", "GFRNONAA", "GFRAA", "ANIONGAP" in the last 168 hours.  No results for input(s): "PROT", "ALBUMIN", "AST", "ALT", "ALKPHOS", "BILITOT" in the last 168 hours. Lipids No results for input(s): "CHOL", "TRIG", "HDL", "LABVLDL", "LDLCALC", "CHOLHDL" in the last 168 hours. HematologyNo results for input(s): "WBC", "RBC", "HGB", "HCT", "MCV", "MCH", "MCHC", "RDW", "PLT" in the last 168 hours. Thyroid No results for input(s): "TSH", "FREET4" in the last 168 hours. BNPNo results for input(s): "BNP", "PROBNP" in the last  168 hours.  DDimer No results for input(s): "DDIMER" in the last 168 hours.   Radiology/Studies:  No results found.   Assessment and Plan:   Acute bradycardia to 26 with hypotension- responded to atropine and is on coreg 6.25 BID, stop coreg, amlodipine, hold lasix and entresto for now.  See Dr. Marsa Aris note  admit to ICU for close monitoring.  Hypotension rec'd IV fluids 400 cc by EMS   labs pending DM-2 hold meds use SSI  glucose stable 096 Hx diastolic CHF but hold lasix now and will defer to Dr. Dellia Cloud  Hypothyroid on levothyroxine check TSH and free T4.  Continue ASA for now.   CAD with CABG in 2015  ( LIMA to LAD, VG to PDA, seq VG to OM1 and OM2 ) monitor troponin EKG with abnormal but may be due to rate and chronic RBBB.   Risk Assessment/Risk Scores:    TIMI Risk Score for Unstable Angina or Non-ST Elevation MI:   The patient's TIMI risk score is  , which indicates a  % risk of all cause mortality, new or recurrent myocardial infarction or need for urgent revascularization in the next 14 days.       Severity of Illness: The appropriate patient status for this patient is INPATIENT. Inpatient status is judged to be reasonable and necessary in order to provide the required intensity of service to ensure the patient's safety. The  patient's presenting symptoms, physical exam findings, and initial radiographic and laboratory data in the context of their chronic comorbidities is felt to place them at high risk for further clinical deterioration. Furthermore, it is not anticipated that the patient will be medically stable for discharge from the hospital within 2 midnights of admission.   * I certify that at the point of admission it is my clinical judgment that the patient will require inpatient hospital care spanning beyond 2 midnights from the point of admission due to high intensity of service, high risk for further deterioration and high frequency of surveillance  required.*   For questions or updates, please contact [facility] Please consult [email or web address] for contact info under     Signed, Cecilie Kicks, NP  02/02/2022 7:48 PM

## 2022-01-16 NOTE — H&P (Signed)
Cardiology History and Physical     Patient ID: Marissa Jefferson MRN: [id number]; DOB: 1924-08-30   Admission date: 01/17/2022  PCP:  Merrilee Seashore, MD   [facility] Cardiologist:  Sanda Klein, MD        Chief Complaint:  weakness  Patient Profile:   Marissa Jefferson is a 88 y.o. female with CAD -CABG 9509, chronic diastolic CHF, post op a fib without subsequent recurrence , HTN, DM-2, HLD, DM-2.Lt breast cancer with surgery and radiation who is being seen 01/11/2022 for the evaluation of sever bradycardia.  History of Present Illness:   Ms. [name]  with hx as above and EF in 2015 20-25% and NSVT, post CABG ( LIMA to LAD, VG to PDA, seq VG to OM1 and OM2 ) EF to 55%.  Hx of diastolic CHF in 05/2669 and again 06/2021, diuresed both times. She has been on amiodarone in past.  Her daughter is a Software engineer. Pt on 02 2 L at home.  Baseline wt 118  --home meds in May 2023 coreg 25 BID, no longer on amiodarone, + amlodipine 5 mg  Echo 07/2021  EF 55-60%, no RWMA, moderate concentric LVH, G2 DD  There is the interventricular septum is flattened  in systole and diastole, consistent with right ventricular pressure and  volume overload.  RV systolic function is severely reduced, RV size is mildly enlarged.  There is severely elevated pulmonary artery  systolic pressure. The estimated right ventricular systolic pressure is 24.5 mmHg. LA and RA mod. Dilated.  Mild MR no AR, no AS    Now presents with decreased sp02 at home 57% on RA, HR 26 BP 82/40.  She was given 400 cc NS, and with no response 1 mg IV atropine with HR improved to 50s after 1 mg.    EKG:  The ECG that was done 01/30/2022 was personally reviewed and demonstrates junctional vs atrial fib with RBBB (chronic) and LAFB HR 53 follow up with ST changes of post MI and read as atrial fib though P waves present at times. PCXR IMPRESSION: Mild vascular congestion possible mild fluid overload  Bilateral pleural effusions and  left lower lobe atelectasis.  BP 92/46 P 53 R 13-19  T 97.4  sp02 on RA 96-92%      Past Medical History:  Diagnosis Date   Acute CHF (congestive heart failure) ([place]) 06/17/2020   Arthritis    "knees" (10/08/2013)   Breast cancer ([place])    "left; I had 62 sessions of radiation"   CAD (coronary artery disease), native coronary artery 10/06/2013   LAD 90%, CFX 90%, RCA 100% w/ collat   CHF (congestive heart failure) ([place] [facility]) 06/17/2020   Chronic combined systolic and diastolic CHF, NYHA class 2 ([place]) 09/2013   CVA (cerebral vascular accident) ([place]) 1968   leaving no deficit   DM2 (diabetes mellitus, type 2) ([facility])    GERD (gastroesophageal reflux disease)    Hyperlipidemia    Hypertension    Hypothyroidism    Ischemic dilated cardiomyopathy ([place]) 09/2013   EF 20-25% by echo   NSVT (nonsustained ventricular tachycardia) ([place]) 09/2013   OAB (overactive bladder)    Osteopenia    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) 05/10/2014    Past Surgical History:  Procedure Laterality Date   BREAST LUMPECTOMY Left 1998   BREAST LUMPECTOMY WITH AXILLARY LYMPH NODE DISSECTION Left 1998   CARDIAC CATHETERIZATION  10/06/2013   CATARACT EXTRACTION W/ INTRAOCULAR LENS  IMPLANT, BILATERAL  Bilateral ~ 2009   COLONOSCOPY  2005   CORONARY ARTERY BYPASS GRAFT [name] 10/09/2013   Procedure: CORONARY ARTERY BYPASS GRAFTING (CABG) x 4 using left internal mammary artery and right saphenous leg vein using endoscope.;  Surgeon: Melrose Nakayama, MD;  Location: [facility];  Service: Open Heart Surgery;  Laterality: [name];   INTRAOPERATIVE TRANSESOPHAGEAL ECHOCARDIOGRAM [name] 10/09/2013   Procedure: INTRAOPERATIVE TRANSESOPHAGEAL ECHOCARDIOGRAM;  Surgeon: Melrose Nakayama, MD;  Location: [facility];  Service: Open Heart Surgery;  Laterality: [name];   LEFT AND RIGHT HEART CATHETERIZATION WITH CORONARY ANGIOGRAM [name] 10/06/2013   Procedure: LEFT AND RIGHT HEART CATHETERIZATION WITH CORONARY  ANGIOGRAM;  Surgeon: Sanda Klein, MD;  Location: [facility];  Service: Cardiovascular;  Laterality: [name];   TONSILLECTOMY AND ADENOIDECTOMY  1930's     Medications Prior to Admission: Prior to Admission medications   Medication Sig Start Date End Date Taking? Authorizing Provider  acetaminophen (TYLENOL) 500 MG tablet Take 1,000 mg by mouth See admin instructions. Take 1,000 mg by mouth in the morning & at bedtime and an additional 500 mg once a day as needed for pain/discomfort    [provider]  amLODipine (NORVASC) 5 MG tablet Take 1 tablet (5 mg total) by mouth daily. 07/14/21   [name], MD  aspirin EC 81 MG tablet Take 1 tablet (81 mg total) by mouth daily. 01/23/19   Croitoru, Mihai, MD  atorvastatin (LIPITOR) 80 MG tablet Take 80 mg by mouth daily. 10/04/13   [provider]  B Complex-Biotin-FA (SUPER B-50 COMPLEX PO) Take 1 tablet by mouth daily before lunch.    [provider]  Biotin 5000 MCG SUBL Place 5,000 mcg under the tongue daily.    [provider]  carvedilol (COREG) 25 MG tablet TAKE 1 TABLET BY MOUTH TWICE A DAY WITH MEALS Patient taking differently: Take 25 mg by mouth 2 (two) times daily with a meal. 05/15/21   Croitoru, Mihai, MD  Cholecalciferol (VITAMIN D3) 125 MCG (5000 UT) CAPS Take 5,000 Units by mouth daily before lunch.    [provider]  Cyanocobalamin (VITAMIN B-12) 5000 MCG SUBL Place 5,000 mcg under the tongue daily before lunch.    [provider]  denosumab (PROLIA) 60 MG/ML SOSY injection Inject 60 mg into the skin every 6 (six) months.    [provider]  docusate sodium (COLACE) 100 MG capsule Take 100 mg by mouth 2 (two) times daily.    [provider]  furosemide (LASIX) 20 MG tablet Take 20 mg daily, add an additional 20 mg of days of weight gain over 120 lb or leg swelling 07/20/21   Warren Lacy, PA-C  levothyroxine (SYNTHROID, LEVOTHROID) 150 MCG tablet Take 150 mcg  by mouth daily before breakfast. 12/06/14   [provider]  magnesium gluconate (MAGONATE) 500 MG tablet Take 500 mg by mouth every evening.    [provider]  metFORMIN (GLUCOPHAGE) 500 MG tablet Take 1,000 mg by mouth 2 (two) times daily.    [provider]  OXYGEN Inhale 2-3 L into the lungs continuous.    [provider]  potassium chloride (KLOR-CON M) 10 MEQ tablet Take 10 mEq by mouth daily.    [provider]  protein supplement shake (PREMIER PROTEIN) LIQD Take 237 mLs by mouth in the morning.    [provider]  sacubitril-valsartan (ENTRESTO) 24-26 MG Take 1 tablet by mouth 2 (two) times daily. 07/13/21   [name], MD  solifenacin (  VESICARE) 10 MG tablet Take 10 mg by mouth at bedtime.    [provider]  VOLTAREN 1 % GEL Apply 2 g topically 2 (two) times daily as needed (for bilateral knee pain). 09/02/13   [provider]  potassium chloride (K-DUR) 10 MEQ tablet Take 0.5 tablets (5 mEq total) by mouth daily. 09/28/14 02/25/15  Croitoru, Dani Gobble, MD     Allergies:    Allergies  Allergen Reactions   Amiodarone Other (See Comments)    Bradycardia   Crestor [Rosuvastatin] Nausea Only   Sitagliptin Other (See Comments)    Caused urinary frequency    Social History:   Social History   Socioeconomic History   Marital status: Widowed    Spouse name: Not on file   Number of children: 2   Years of education: Not on file   Highest education level: High school graduate  Occupational History   Occupation: [employer]  Tobacco Use   Smoking status: Former    Packs/day: 0.25    Years: 3.00    Total pack years: 0.75    Types: Cigarettes    Quit date: 03/12/1948    Years since quitting: 73.8   Smokeless tobacco: Never   Tobacco comments:    smoked in college   Vaping Use   Vaping Use: Never used  Substance and Sexual Activity   Alcohol use: No   Drug use: No   Sexual activity: Not Currently  Other  Topics Concern   Not on file  Social History Narrative   Not on file   Social Determinants of Health   Financial Resource Strain: Not on file  Food Insecurity: No Food Insecurity (07/11/2021)   Hunger Vital Sign    Worried About Running Out of Food in the Last Year: Never true    [place] in the Last Year: Never true  Transportation Needs: No Transportation Needs (07/11/2021)   PRAPARE - Hydrologist (Medical): No    Lack of Transportation (Non-Medical): No  Physical Activity: Not on file  Stress: Not on file  Social Connections: Not on file  Intimate Partner Violence: Not on file    Family History:   The patient's family history includes Asthma in her daughter and son; Cancer in her father; Colon cancer in her mother; Heart attack in her father.    ROS:  Please see the history of present illness.  General:no colds or fevers, no weight changes + weakness Skin:no rashes or ulcers HEENT:no blurred vision, no congestion CV:see HPI PUL:see HPI GI:no diarrhea constipation or melena, no indigestion GU:no hematuria, no dysuria MS:no joint pain, no claudication Neuro:no syncope, no lightheadedness Endo:+ diabetes CBG 217 , no thyroid disease All other ROS reviewed and negative.     Physical Exam/Data:   Vitals:   02/06/2022 1925 01/27/2022 1929 02/06/2022 1930  BP: (!) 81/51 (!) 81/51 (!) 79/49  Pulse:  (!) 53 86  Resp:  18 19  Temp:  (!) 97.4 F (36.3 C)   TempSrc:  Oral   SpO2:  100% 100%   No intake or output data in the 24 hours ending 01/24/2022 1948    07/20/2021   11:29 AM 07/12/2021    5:00 AM 07/11/2021    2:57 AM  Last 3 Weights  Weight (lbs) 122 lb 12.8 oz 128 lb 1.4 oz 129 lb 6.6 oz  Weight (kg) 55.702 kg 58.1 kg 58.7 kg     There is no height or  weight on file to calculate BMI.   Exam per Dr. Dellia Cloud     Relevant CV Studies:  Echo 07/10/21 IMPRESSIONS     1. Left ventricular ejection fraction, by estimation, is 55 to  60%. The  left ventricle has normal function. The left ventricle has no regional  wall motion abnormalities. There is moderate concentric left ventricular  hypertrophy. Left ventricular  diastolic parameters are consistent with Grade II diastolic dysfunction  (pseudonormalization). There is the interventricular septum is flattened  in systole and diastole, consistent with right ventricular pressure and  volume overload.   2. Right ventricular systolic function is severely reduced. The right  ventricular size is mildly enlarged. Moderately increased right  ventricular wall thickness. There is severely elevated pulmonary artery  systolic pressure. The estimated right  ventricular systolic pressure is 09.3 mmHg.   3. Left atrial size was moderately dilated.   4. Right atrial size was moderately dilated.   5. The mitral valve is abnormal. Mild mitral valve regurgitation. No  evidence of mitral stenosis.   6. The aortic valve is tricuspid. Aortic valve regurgitation is not  visualized. Aortic valve sclerosis/calcification is present, without any  evidence of aortic stenosis.   7. The inferior vena cava is dilated in size with >50% respiratory  variability, suggesting right atrial pressure of 8 mmHg.   8. Cannot exclude a small PFO.   Comparison(s): No significant change from prior study.   Conclusion(s)/Recommendation(s): Severe pulmonary hypertension with RV  pressure/volume overload.   FINDINGS   Left Ventricle: Left ventricular ejection fraction, by estimation, is 55  to 60%. The left ventricle has normal function. The left ventricle has no  regional wall motion abnormalities. The left ventricular internal cavity  size was normal in size. There is   moderate concentric left ventricular hypertrophy. The interventricular  septum is flattened in systole and diastole, consistent with right  ventricular pressure and volume overload. Left ventricular diastolic  parameters are consistent  with Grade II  diastolic dysfunction (pseudonormalization).   Right Ventricle: The right ventricular size is mildly enlarged. Moderately  increased right ventricular wall thickness. Right ventricular systolic  function is severely reduced. There is severely elevated pulmonary artery  systolic pressure. The tricuspid  regurgitant velocity is 3.73 m/s, and with an assumed right atrial  pressure of 8 mmHg, the estimated right ventricular systolic pressure is  23.5 mmHg.   Left Atrium: Left atrial size was moderately dilated.   Right Atrium: Right atrial size was moderately dilated.   Pericardium: Trivial pericardial effusion is present.   Mitral Valve: The mitral valve is abnormal. There is mild thickening of  the mitral valve leaflet(s). There is mild calcification of the mitral  valve leaflet(s). Mild mitral valve regurgitation. No evidence of mitral  valve stenosis.   Tricuspid Valve: The tricuspid valve is normal in structure. Tricuspid  valve regurgitation is mild . No evidence of tricuspid stenosis.   Aortic Valve: Partially fused NCC and LCC. The aortic valve is tricuspid.  Aortic valve regurgitation is not visualized. Aortic valve  sclerosis/calcification is present, without any evidence of aortic  stenosis. Aortic valve peak gradient measures 12.1  mmHg.   Pulmonic Valve: The pulmonic valve was grossly normal. Pulmonic valve  regurgitation is mild. No evidence of pulmonic stenosis.   Aorta: The aortic root, ascending aorta, aortic arch and descending aorta  are all structurally normal, with no evidence of dilitation or  obstruction.   Venous: The inferior vena cava is dilated in size  with greater than 50%  respiratory variability, suggesting right atrial pressure of 8 mmHg.   Echocardiogram 06/18/2020    1. Left ventricular ejection fraction, by estimation, is 55 to 60%. The  left ventricle has normal function. The left ventricle has no regional  wall motion  abnormalities. There is moderate left ventricular hypertrophy.  Left ventricular diastolic  parameters are consistent with Grade II diastolic dysfunction  (pseudonormalization). Elevated left ventricular end-diastolic pressure.  There is the interventricular septum is flattened in diastole ('D' shaped  left ventricle), consistent with right  ventricular volume overload and the interventricular septum is flattened  in systole, consistent with right ventricular pressure overload.   2. Right ventricular systolic function is severely reduced. The right  ventricular size is mildly enlarged. There is severely elevated pulmonary  artery systolic pressure. The estimated right ventricular systolic  pressure is 09.3 mmHg.   3. Left atrial size was mildly dilated.   4. Large pleural effusion in the left lateral region.   5. The mitral valve is grossly normal. Mild mitral valve regurgitation.   6. Tricuspid valve regurgitation is moderate.   7. The NCC and the LCC are fused. . The aortic valve is tricuspid. Aortic  valve regurgitation is not visualized. Mild aortic valve stenosis.   8. The inferior vena cava is dilated in size with <50% respiratory  variability, suggesting right atrial pressure of 15 mmHg.     Laboratory Data:  High Sensitivity Troponin:  No results for input(s): "TROPONINIHS" in the last 720 hours.    ChemistryNo results for input(s): "NA", "K", "CL", "CO2", "GLUCOSE", "BUN", "CREATININE", "CALCIUM", "MG", "GFRNONAA", "GFRAA", "ANIONGAP" in the last 168 hours.  No results for input(s): "PROT", "ALBUMIN", "AST", "ALT", "ALKPHOS", "BILITOT" in the last 168 hours. Lipids No results for input(s): "CHOL", "TRIG", "HDL", "LABVLDL", "LDLCALC", "CHOLHDL" in the last 168 hours. HematologyNo results for input(s): "WBC", "RBC", "HGB", "HCT", "MCV", "MCH", "MCHC", "RDW", "PLT" in the last 168 hours. Thyroid No results for input(s): "TSH", "FREET4" in the last 168 hours. BNPNo results for  input(s): "BNP", "PROBNP" in the last 168 hours.  DDimer No results for input(s): "DDIMER" in the last 168 hours.   Radiology/Studies:  No results found.   Assessment and Plan:   Acute bradycardia to 26 with hypotension- responded to atropine and is on coreg 6.25 BID, stop coreg, amlodipine, hold lasix and entresto for now.  See Dr. Marsa Aris note  admit to ICU for close monitoring.  Hypotension rec'd IV fluids 400 cc by EMS   labs pending DM-2 hold meds use SSI  glucose stable 235 Hx diastolic CHF but hold lasix now and will defer to Dr. Dellia Cloud  Hypothyroid on levothyroxine check TSH and free T4.  Continue ASA for now.   CAD with CABG in 2015  ( LIMA to LAD, VG to PDA, seq VG to OM1 and OM2 ) monitor troponin EKG with abnormal but may be due to rate and chronic RBBB.   Risk Assessment/Risk Scores:    TIMI Risk Score for Unstable Angina or Non-ST Elevation MI:   The patient's TIMI risk score is  , which indicates a  % risk of all cause mortality, new or recurrent myocardial infarction or need for urgent revascularization in the next 14 days.       Severity of Illness: The appropriate patient status for this patient is INPATIENT. Inpatient status is judged to be reasonable and necessary in order to provide the required intensity of service to ensure the patient's  safety. The patient's presenting symptoms, physical exam findings, and initial radiographic and laboratory data in the context of their chronic comorbidities is felt to place them at high risk for further clinical deterioration. Furthermore, it is not anticipated that the patient will be medically stable for discharge from the hospital within 2 midnights of admission.   * I certify that at the point of admission it is my clinical judgment that the patient will require inpatient hospital care spanning beyond 2 midnights from the point of admission due to high intensity of service, high risk for further deterioration and  high frequency of surveillance required.*   For questions or updates, please contact [facility] Please consult [email or web address] for contact info under     Signed, Cecilie Kicks, NP  01/12/2022 7:48 PM

## 2022-01-16 NOTE — ED Provider Notes (Signed)
[facility]ERGENC[facility] DEPARTMENT Provider Note   CSN: [id number] Arrival date & time: 01/25/2022  1915     History  Chief Complaint  Patient presents with   Bradycardia    Marissa Jefferson is a 88 y.o. female.  The history is provided by the patient and medical records. No language interpreter was used.  Near Syncope This is a new problem. The current episode started more than 2 days ago. The problem occurs constantly. The problem has not changed since onset.Associated symptoms include shortness of breath (mild). Pertinent negatives include no chest pain, no abdominal pain and no headaches. Nothing aggravates the symptoms. Nothing relieves the symptoms. She has tried nothing for the symptoms. The treatment provided no relief.       Home Medications Prior to Admission medications   Medication Sig Start Date End Date Taking? Authorizing Provider  acetaminophen (TYLENOL) 500 MG tablet Take 1,000 mg by mouth See admin instructions. Take 1,000 mg by mouth in the morning & at bedtime and an additional 500 mg once a day as needed for pain/discomfort    [provider]  amLODipine (NORVASC) 5 MG tablet Take 1 tablet (5 mg total) by mouth daily. 07/14/21   [name], MD  aspirin EC 81 MG tablet Take 1 tablet (81 mg total) by mouth daily. 01/23/19   Croitoru, Mihai, MD  atorvastatin (LIPITOR) 80 MG tablet Take 80 mg by mouth daily. 10/04/13   [provider]  B Complex-Biotin-FA (SUPER B-50 COMPLEX PO) Take 1 tablet by mouth daily before lunch.    [provider]  Biotin 5000 MCG SUBL Place 5,000 mcg under the tongue daily.    [provider]  carvedilol (COREG) 25 MG tablet TAKE 1 TABLET BY MOUTH TWICE A DAY WITH MEALS Patient taking differently: Take 25 mg by mouth 2 (two) times daily with a meal. 05/15/21   Croitoru, Mihai, MD  Cholecalciferol (VITAMIN D3) 125 MCG (5000 UT) CAPS Take 5,000 Units by mouth daily before lunch.    [provider]  Cyanocobalamin (VITAMIN B-12) 5000 MCG SUBL Place 5,000 mcg under the tongue daily before lunch.    [provider]  denosumab (PROLIA) 60 MG/ML SOSY injection Inject 60 mg into the skin every 6 (six) months.    [provider]  docusate sodium (COLACE) 100 MG capsule Take 100 mg by mouth 2 (two) times daily.    [provider]  furosemide (LASIX) 20 MG tablet Take 20 mg daily, add an additional 20 mg of days of weight gain over 120 lb or leg swelling 07/20/21   Warren Lacy, PA-C  levothyroxine (SYNTHROID, LEVOTHROID) 150 MCG tablet Take 150 mcg by mouth daily before breakfast. 12/06/14   [provider]  magnesium gluconate (MAGONATE) 500 MG tablet Take 500 mg by mouth every evening.    [provider]  metFORMIN (GLUCOPHAGE) 500 MG tablet Take 1,000 mg by mouth 2 (two) times daily.    [provider]  OXYGEN Inhale 2-3 L into the lungs continuous.    [provider]  potassium chloride (KLOR-CON M) 10 MEQ tablet Take 10 mEq by mouth daily.    [provider]  protein supplement shake (PREMIER PROTEIN) LIQD Take 237 mLs by mouth in the morning.    [provider]  sacubitril-valsartan (ENTRESTO) 24-26 MG Take 1 tablet by mouth 2 (two) times daily. 07/13/21   [name], MD  solifenacin (VESICARE) 10 MG tablet Take 10 mg by  mouth at bedtime.    [provider]  VOLTAREN 1 % GEL Apply 2 g topically 2 (two) times daily as needed (for bilateral knee pain). 09/02/13   [provider]  potassium chloride (K-DUR) 10 MEQ tablet Take 0.5 tablets (5 mEq total) by mouth daily. 09/28/14 02/25/15  Croitoru, Mihai, MD      Allergies    Amiodarone, Crestor [rosuvastatin], and Sitagliptin    Review of Systems   Review of Systems  Constitutional:  Positive for fatigue. Negative for chills and fever.  HENT:  Positive for facial swelling. Negative for congestion.   Eyes:  Negative for  visual disturbance.  Respiratory:  Positive for shortness of breath (mild). Negative for cough, chest tightness and wheezing.   Cardiovascular:  Positive for leg swelling and near-syncope. Negative for chest pain and palpitations.  Gastrointestinal:  Negative for abdominal pain, constipation, diarrhea, nausea and vomiting.  Genitourinary:  Negative for dysuria and flank pain.  Musculoskeletal:  Negative for back pain, neck pain and neck stiffness.  Skin:  Negative for rash and wound.  Neurological:  Positive for light-headedness. Negative for dizziness, seizures, syncope, weakness, numbness and headaches.  Psychiatric/Behavioral:  Negative for agitation and confusion.   All other systems reviewed and are negative.   Physical Exam Updated Vital Signs There were no vitals taken for this visit. Physical Exam Vitals and nursing note reviewed.  Constitutional:      General: She is not in acute distress.    Appearance: She is well-developed. She is ill-appearing. She is not toxic-appearing or diaphoretic.  HENT:     Head: Normocephalic and atraumatic.     Nose: No congestion or rhinorrhea.     Mouth/Throat:     Mouth: Mucous membranes are dry.  Eyes:     Extraocular Movements: Extraocular movements intact.     Conjunctiva/sclera: Conjunctivae normal.     Pupils: Pupils are equal, round, and reactive to light.  Cardiovascular:     Rate and Rhythm: Regular rhythm. Bradycardia present.     Heart sounds: No murmur heard. Pulmonary:     Effort: Pulmonary effort is normal. No respiratory distress.     Breath sounds: Rales present. No wheezing or rhonchi.  Chest:     Chest wall: No tenderness.  Abdominal:     General: Abdomen is flat.     Palpations: Abdomen is soft.     Tenderness: There is no abdominal tenderness. There is no right CVA tenderness, left CVA tenderness, guarding or rebound.  Musculoskeletal:        General: No swelling or tenderness.     Cervical back: Neck supple. No  tenderness.     Right lower leg: Edema present.     Left lower leg: Edema present.  Skin:    General: Skin is warm and dry.     Capillary Refill: Capillary refill takes less than 2 seconds.     Findings: No erythema or rash.  Neurological:     General: No focal deficit present.     Mental Status: She is alert.     Sensory: No sensory deficit.     Motor: No weakness.  Psychiatric:        Mood and Affect: Mood normal.     ED Results / Procedures / Treatments   Labs (all labs ordered are listed, but only abnormal results are displayed) Labs Reviewed  CBC WITH DIFFERENTIAL/PLATELET - Abnormal; Notable for the following components:      Result Value  WBC 10.9 (*)    Hemoglobin 16.5 (*)    HCT 48.9 (*)    MCV 107.2 (*)    MCH 36.2 (*)    Neutro Abs 9.6 (*)    Lymphs Abs 0.5 (*)    All other components within normal limits  COMPREHENSIVE METABOLIC PANEL - Abnormal; Notable for the following components:   Potassium 6.8 (*)    CO2 17 (*)    Glucose, Bld 232 (*)    BUN 81 (*)    Creatinine, Ser 2.11 (*)    Calcium 10.9 (*)    Total Protein 6.0 (*)    AST 42 (*)    ALT 57 (*)    GFR, Estimated 21 (*)    Anion gap 19 (*)    All other components within normal limits  TSH - Abnormal; Notable for the following components:   TSH 32.325 (*)    All other components within normal limits  BRAIN NATRIURETIC PEPTIDE - Abnormal; Notable for the following components:   B Natriuretic Peptide 3,526.7 (*)    All other components within normal limits  CBC - Abnormal; Notable for the following components:   WBC 11.1 (*)    Hemoglobin 16.0 (*)    HCT 49.0 (*)    MCV 107.5 (*)    MCH 35.1 (*)    Platelets 119 (*)    All other components within normal limits  CREATININE, SERUM - Abnormal; Notable for the following components:   Creatinine, Ser 2.10 (*)    GFR, Estimated 21 (*)    All other components within normal limits  HEMOGLOBIN A1C - Abnormal; Notable for the following components:    Hgb A1c MFr Bld 8.2 (*)    All other components within normal limits  CBG MONITORING, ED - Abnormal; Notable for the following components:   Glucose-Capillary 201 (*)    All other components within normal limits  TROPONIN I (HIGH SENSITIVITY) - Abnormal; Notable for the following components:   Troponin I (High Sensitivity) 66 (*)    All other components within normal limits  TROPONIN I (HIGH SENSITIVITY) - Abnormal; Notable for the following components:   Troponin I (High Sensitivity) 61 (*)    All other components within normal limits  MAGNESIUM  URINALYSIS, ROUTINE W REFLEX MICROSCOPIC  BRAIN NATRIURETIC PEPTIDE  BASIC METABOLIC PANEL  CBC  PROTIME-INR  T4, FREE    EKG EKG Interpretation  Date/Time:  Tuesday January 16 2022 19:27:39 EST Ventricular Rate:  53 PR Interval:  133 QRS Duration: 172 QT Interval:  493 QTC Calculation: 463 R Axis:   215 Text Interpretation: Atrial fibrillation Right bundle branch block when compared to prior, slower rate with suspected afib. No STEMI Confirmed by Antony Blackbird [phone]) on 02/04/2022 8:10:40 PM  Radiology DG Chest Portable 1 View  Result Date: 02/01/2022 CLINICAL DATA:  Bradycardia EXAM: PORTABLE CHEST 1 VIEW COMPARISON:  Chest 07/11/2021 FINDINGS: Postop CABG. Mild vascular congestion. Small bilateral pleural effusions. Possible mild interstitial edema Left lower lobe airspace disease most compatible with atelectasis. IMPRESSION: Mild vascular congestion possible mild fluid overload Bilateral pleural effusions and left lower lobe atelectasis. Electronically Signed   By: Franchot Gallo M.D.   On: 01/20/2022 19:51    Procedures Procedures    CRITICAL CARE Performed by: Gwenyth Allegra Tychelle Purkey Total critical care time: 50 minutes Critical care time was exclusive of separately billable procedures and treating other patients. Critical care was necessary to treat or prevent imminent or life-threatening deterioration. Critical care  was time spent personally by me on the following activities: development of treatment plan with patient and/or surrogate as well as nursing, discussions with consultants, evaluation of patient's response to treatment, examination of patient, obtaining history from patient or surrogate, ordering and performing treatments and interventions, ordering and review of laboratory studies, ordering and review of radiographic studies, pulse oximetry and re-evaluation of patient's condition.    Medications Ordered in ED Medications  DOPamine (INTROPIN) 800 mg in dextrose 5 % 250 mL (3.2 mg/mL) infusion (10 mcg/kg/min  54.4 kg Intravenous Rate/Dose Change 01/15/2022 2053)  aspirin EC tablet 81 mg (81 mg Oral Given 01/18/2022 2141)  atorvastatin (LIPITOR) tablet 80 mg (80 mg Oral Given 02/08/2022 2140)  levothyroxine (SYNTHROID) tablet 150 mcg (has no administration in time range)  docusate sodium (COLACE) capsule 100 mg (100 mg Oral Patient Refused/Not Given 01/26/2022 2141)  heparin injection 5,000 Units (5,000 Units Subcutaneous Given 01/15/2022 2140)  0.9 %  sodium chloride infusion ( Intravenous New Bag/Given 01/20/2022 2137)  insulin aspart (novoLOG) injection 0-9 Units (has no administration in time range)  insulin aspart (novoLOG) injection 0-5 Units (2 Units Subcutaneous Given 01/14/2022 2140)  acetaminophen (TYLENOL) tablet 500 mg (has no administration in time range)  sodium chloride 0.9 % bolus 500 mL (0 mLs Intravenous Stopped 02/08/2022 2045)    ED Course/ Medical Decision Making/ A&P                           Medical Decision Making Amount and/or Complexity of Data Reviewed Labs: ordered. Radiology: ordered.  Risk Decision regarding hospitalization.    Marissa Jefferson is a 88 y.o. female with a past medical history significant for hypertension, hyperlipidemia, diabetes, GERD, previous NSVT, previous stroke, breast cancer, CHF, CAD with previous CABG, and pulmonary nodules who presents with fatigue,  lightheadedness, hypotension, hypoxia, and bradycardia.  According to EMS, patient has had fatigue for the last few days and when she was getting a home health assessment, she was found of oxygen saturations in the 50s.  She was complaining of some mild shortness of breath but is more fatigued and tired and lightheaded.  She was found by EMS to have heart rate in the low 20s approximately 24.  They gave her some fluids and atropine and it improved into the 50s.  On arrival, blood pressure is in the 80s, heart rate is in the 50s, and oxygen saturations are now in the 90s on nasal cannula oxygen.  Patient is denying any chest pain, palpitations, or shortness of breath currently, she just feels fatigued.  She denies any new pain.  She does have some edema in her legs and some swelling of her face.  She denies any fevers, chills, congestion, or cough.  Denies any GI or other GU symptoms.  Due to the patient's intermittent bradycardia, hypotension, hypoxia, and cardiac history, I am concerned about flooding her with too much fluid.  We will give a small amount of fluids more due to the hypotension and will call cardiology.  We will get screening labs for fatigue and heart rhythm and rate changes.  8:19 PM Cardiology was called due to hypotension in the setting of the intermittent bradycardia and hypotension.  Will call critical care for ICU admission.  Cardiology reports they are going to talk to the electrophysiology team to discuss a plan.  Patient does have some peripheral edema and some swelling of the face which is unclear etiology.  Critical care came  and saw the patient and felt that this was more of a cardiology problem initially and would benefit from the cards admission.  Cardiology then returned and will admit for further management.  After patient was already admitted by cardiology to the ICU, I was reviewing the patient's work-up that is further along and I am concerned about some of the results  that have finalized.  Patient now has evidence of hyperkalemia with potassium of 6.8, AKI, and TSH elevated.  BNP also elevated and troponin is stably elevated appears.  I just spoke with critical care who will come reassess the patient and give recommendations due to the other medical abnormalities that the work-up has revealed.        Final Clinical Impression(s) / ED Diagnoses Final diagnoses:  Bradycardia  Symptomatic bradycardia  Hypotension, unspecified hypotension type  Hyperkalemia  Elevated brain natriuretic peptide (BNP) level  AKI (acute kidney injury) ([place])  TSH elevation    Clinical Impression: 1. Bradycardia   2. Symptomatic bradycardia   3. Hypotension, unspecified hypotension type   4. Hyperkalemia   5. Elevated brain natriuretic peptide (BNP) level   6. AKI (acute kidney injury) ([place])   7. TSH elevation     Disposition: Admit  This note was prepared with assistance of Dragon voice recognition software. Occasional wrong-word or sound-a-like substitutions may have occurred due to the inherent limitations of voice recognition software.        Dinari Stgermaine, Gwenyth Allegra, MD 01/25/2022 2322

## 2022-01-16 NOTE — ED Triage Notes (Signed)
Pt to ED via EMS from home. Pt had care aid that came over to check on pt and noticed pt's pulse ox was 57% RA. Aid called EMS> when EMS arrived, EMS noticed pt's HR was 26. Rhythm a fib and sinus brady. Pt also hypotensive with EMS 82/40. Pt c/o fatigue x1 day. Pt denies CP and SOB. Pt also c/o some diarrhea. Pt RA baseline but has PRN home O2. EMS gave 400cc NS bolus. Pt's HR did not respond to fluids. EMS gave '1mg'$  of atropine en route. HR came up to low 50s after atropine.   Hx: CHF, a fib  EMS Vitals: '1mg'$  atropine 400cc NS 100/52 217 CBG 18-20 RR 20-50 HR 20LAC

## 2022-01-16 NOTE — H&P (Deleted)
Cardiology History and Physical    Patient ID: [name] MRN: [id number]; DOB: [birth year]   Admission date: 01/18/2022  PCP:  Merrilee Seashore, MD   [facility] Cardiologist:  Sanda Klein, MD        Chief Complaint:  weakness  Patient Profile:   Marissa Jefferson is a 88 y.o. female with CAD -CABG 1308, chronic diastolic CHF, post op a fib without subsequent recurrence , HTN, DM-2, HLD, DM-2.Lt breast cancer with surgery and radiation who is being seen 01/10/2022 for the evaluation of sever bradycardia.  History of Present Illness:   [name]  with hx as above and EF in 2015 20-25% and NSVT, post CABG ( LIMA to LAD, VG to PDA, seq VG to OM1 and OM2 ) EF to 55%.  Hx of diastolic CHF in 08/5782 and again 06/2021, diuresed both times. She has been on amiodarone in past.  Her daughter is a Software engineer. Pt on 02 2 L at home.  Baseline wt 118  --home meds in May 2023 coreg 25 BID, no longer on amiodarone, + amlodipine 5 mg  Echo 07/2021  EF 55-60%, no RWMA, moderate concentric LVH, G2 DD  There is the interventricular septum is flattened  in systole and diastole, consistent with right ventricular pressure and  volume overload.  RV systolic function is severely reduced, RV size is mildly enlarged.  There is severely elevated pulmonary artery  systolic pressure. The estimated right ventricular systolic pressure is 69.6 mmHg. LA and RA mod. Dilated.  Mild MR no AR, no AS    Now presents with decreased sp02 at home 57% on RA, HR 26 BP 82/40.  She was given 400 cc NS, and with no response 1 mg IV atropine with HR improved to 50s after 1 mg.    EKG:  The ECG that was done 02/06/2022 was personally reviewed and demonstrates junctional vs atrial fib with RBBB (chronic) and LAFB HR 53 follow up with ST changes of post MI and read as atrial fib though P waves present at times. PCXR IMPRESSION: Mild vascular congestion possible mild fluid overload  Bilateral pleural effusions and  left lower lobe atelectasis.  BP 92/46 P 53 R 13-19  T 97.4  sp02 on RA 96-92%      Past Medical History:  Diagnosis Date   Acute CHF (congestive heart failure) ([facility]) 06/17/2020   Arthritis    "knees" (10/08/2013)   Breast cancer ([facility])    "left; I had 62 sessions of radiation"   CAD (coronary artery disease), native coronary artery 10/06/2013   LAD 90%, CFX 90%, RCA 100% w/ collat   CHF (congestive heart failure) ([facility]) 06/17/2020   Chronic combined systolic and diastolic CHF, NYHA class 2 ([facility]) 09/2013   CVA (cerebral vascular accident) ([facility]) 1968   leaving no deficit   DM2 (diabetes mellitus, type 2) ([facility])    GERD (gastroesophageal reflux disease)    Hyperlipidemia    Hypertension    Hypothyroidism    Ischemic dilated cardiomyopathy ([place]) 09/2013   EF 20-25% by echo   NSVT (nonsustained ventricular tachycardia) ([place]) 09/2013   OAB (overactive bladder)    Osteopenia    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) 05/10/2014    Past Surgical History:  Procedure Laterality Date   BREAST LUMPECTOMY Left 1998   BREAST LUMPECTOMY WITH AXILLARY LYMPH NODE DISSECTION Left 1998   CARDIAC CATHETERIZATION  10/06/2013   CATARACT EXTRACTION W/ INTRAOCULAR LENS  IMPLANT, BILATERAL Bilateral ~  2009   COLONOSCOPY  2005   CORONARY ARTERY BYPASS GRAFT [name] 10/09/2013   Procedure: CORONARY ARTERY BYPASS GRAFTING (CABG) x 4 using left internal mammary artery and right saphenous leg vein using endoscope.;  Surgeon: Melrose Nakayama, MD;  Location: [facility];  Service: Open Heart Surgery;  Laterality: [name];   INTRAOPERATIVE TRANSESOPHAGEAL ECHOCARDIOGRAM [name] 10/09/2013   Procedure: INTRAOPERATIVE TRANSESOPHAGEAL ECHOCARDIOGRAM;  Surgeon: Melrose Nakayama, MD;  Location: [facility];  Service: Open Heart Surgery;  Laterality: [name];   LEFT AND RIGHT HEART CATHETERIZATION WITH CORONARY ANGIOGRAM [name] 10/06/2013   Procedure: LEFT AND RIGHT HEART CATHETERIZATION WITH CORONARY  ANGIOGRAM;  Surgeon: Sanda Klein, MD;  Location: [facility];  Service: Cardiovascular;  Laterality: [name];   TONSILLECTOMY AND ADENOIDECTOMY  1930's     Medications Prior to Admission: Prior to Admission medications   Medication Sig Start Date End Date Taking? Authorizing Provider  acetaminophen (TYLENOL) 500 MG tablet Take 1,000 mg by mouth See admin instructions. Take 1,000 mg by mouth in the morning & at bedtime and an additional 500 mg once a day as needed for pain/discomfort    [provider]  amLODipine (NORVASC) 5 MG tablet Take 1 tablet (5 mg total) by mouth daily. 07/14/21   [name], MD  aspirin EC 81 MG tablet Take 1 tablet (81 mg total) by mouth daily. 01/23/19   Croitoru, Mihai, MD  atorvastatin (LIPITOR) 80 MG tablet Take 80 mg by mouth daily. 10/04/13   [provider]  B Complex-Biotin-FA (SUPER B-50 COMPLEX PO) Take 1 tablet by mouth daily before lunch.    [provider]  Biotin 5000 MCG SUBL Place 5,000 mcg under the tongue daily.    [provider]  carvedilol (COREG) 25 MG tablet TAKE 1 TABLET BY MOUTH TWICE A DAY WITH MEALS Patient taking differently: Take 25 mg by mouth 2 (two) times daily with a meal. 05/15/21   Croitoru, Mihai, MD  Cholecalciferol (VITAMIN D3) 125 MCG (5000 UT) CAPS Take 5,000 Units by mouth daily before lunch.    [provider]  Cyanocobalamin (VITAMIN B-12) 5000 MCG SUBL Place 5,000 mcg under the tongue daily before lunch.    [provider]  denosumab (PROLIA) 60 MG/ML SOSY injection Inject 60 mg into the skin every 6 (six) months.    [provider]  docusate sodium (COLACE) 100 MG capsule Take 100 mg by mouth 2 (two) times daily.    [provider]  furosemide (LASIX) 20 MG tablet Take 20 mg daily, add an additional 20 mg of days of weight gain over 120 lb or leg swelling 07/20/21   Warren Lacy, PA-C  levothyroxine (SYNTHROID, LEVOTHROID) 150 MCG tablet Take 150 mcg  by mouth daily before breakfast. 12/06/14   [provider]  magnesium gluconate (MAGONATE) 500 MG tablet Take 500 mg by mouth every evening.    [provider]  metFORMIN (GLUCOPHAGE) 500 MG tablet Take 1,000 mg by mouth 2 (two) times daily.    [provider]  OXYGEN Inhale 2-3 L into the lungs continuous.    [provider]  potassium chloride (KLOR-CON M) 10 MEQ tablet Take 10 mEq by mouth daily.    [provider]  protein supplement shake (PREMIER PROTEIN) LIQD Take 237 mLs by mouth in the morning.    [provider]  sacubitril-valsartan (ENTRESTO) 24-26 MG Take 1 tablet by mouth 2 (two) times daily. 07/13/21   [name], MD  solifenacin (Costilla) 10  MG tablet Take 10 mg by mouth at bedtime.    [provider]  VOLTAREN 1 % GEL Apply 2 g topically 2 (two) times daily as needed (for bilateral knee pain). 09/02/13   [provider]  potassium chloride (K-DUR) 10 MEQ tablet Take 0.5 tablets (5 mEq total) by mouth daily. 09/28/14 02/25/15  Croitoru, Dani Gobble, MD     Allergies:    Allergies  Allergen Reactions   Amiodarone Other (See Comments)    Bradycardia   Crestor [Rosuvastatin] Nausea Only   Sitagliptin Other (See Comments)    Caused urinary frequency    Social History:   Social History   Socioeconomic History   Marital status: Widowed    Spouse name: Not on file   Number of children: 2   Years of education: Not on file   Highest education level: High school graduate  Occupational History   Occupation: [employer]  Tobacco Use   Smoking status: Former    Packs/day: 0.25    Years: 3.00    Total pack years: 0.75    Types: Cigarettes    Quit date: 03/12/1948    Years since quitting: 73.8   Smokeless tobacco: Never   Tobacco comments:    smoked in college   Vaping Use   Vaping Use: Never used  Substance and Sexual Activity   Alcohol use: No   Drug use: No   Sexual activity: Not Currently  Other  Topics Concern   Not on file  Social History Narrative   Not on file   Social Determinants of Health   Financial Resource Strain: Not on file  Food Insecurity: No Food Insecurity (07/11/2021)   Hunger Vital Sign    Worried About Running Out of Food in the Last Year: Never true    [place] in the Last Year: Never true  Transportation Needs: No Transportation Needs (07/11/2021)   PRAPARE - Hydrologist (Medical): No    Lack of Transportation (Non-Medical): No  Physical Activity: Not on file  Stress: Not on file  Social Connections: Not on file  Intimate Partner Violence: Not on file    Family History:   The patient's family history includes Asthma in her daughter and son; Cancer in her father; Colon cancer in her mother; Heart attack in her father.    ROS:  Please see the history of present illness.  General:no colds or fevers, no weight changes + weakness Skin:no rashes or ulcers HEENT:no blurred vision, no congestion CV:see HPI PUL:see HPI GI:no diarrhea constipation or melena, no indigestion GU:no hematuria, no dysuria MS:no joint pain, no claudication Neuro:no syncope, no lightheadedness Endo:+ diabetes CBG 217 , no thyroid disease All other ROS reviewed and negative.     Physical Exam/Data:   Vitals:   01/30/2022 1925 01/15/2022 1929 01/31/2022 1930  BP: (!) 81/51 (!) 81/51 (!) 79/49  Pulse:  (!) 53 86  Resp:  18 19  Temp:  (!) 97.4 F (36.3 C)   TempSrc:  Oral   SpO2:  100% 100%   No intake or output data in the 24 hours ending 01/30/2022 1948    07/20/2021   11:29 AM 07/12/2021    5:00 AM 07/11/2021    2:57 AM  Last 3 Weights  Weight (lbs) 122 lb 12.8 oz 128 lb 1.4 oz 129 lb 6.6 oz  Weight (kg) 55.702 kg 58.1 kg 58.7 kg     There is no height or weight on  file to calculate BMI.   Exam per Dr. Dellia Cloud     Relevant CV Studies:  Echo 07/10/21 IMPRESSIONS     1. Left ventricular ejection fraction, by estimation, is 55 to  60%. The  left ventricle has normal function. The left ventricle has no regional  wall motion abnormalities. There is moderate concentric left ventricular  hypertrophy. Left ventricular  diastolic parameters are consistent with Grade II diastolic dysfunction  (pseudonormalization). There is the interventricular septum is flattened  in systole and diastole, consistent with right ventricular pressure and  volume overload.   2. Right ventricular systolic function is severely reduced. The right  ventricular size is mildly enlarged. Moderately increased right  ventricular wall thickness. There is severely elevated pulmonary artery  systolic pressure. The estimated right  ventricular systolic pressure is 35.5 mmHg.   3. Left atrial size was moderately dilated.   4. Right atrial size was moderately dilated.   5. The mitral valve is abnormal. Mild mitral valve regurgitation. No  evidence of mitral stenosis.   6. The aortic valve is tricuspid. Aortic valve regurgitation is not  visualized. Aortic valve sclerosis/calcification is present, without any  evidence of aortic stenosis.   7. The inferior vena cava is dilated in size with >50% respiratory  variability, suggesting right atrial pressure of 8 mmHg.   8. Cannot exclude a small PFO.   Comparison(s): No significant change from prior study.   Conclusion(s)/Recommendation(s): Severe pulmonary hypertension with RV  pressure/volume overload.   FINDINGS   Left Ventricle: Left ventricular ejection fraction, by estimation, is 55  to 60%. The left ventricle has normal function. The left ventricle has no  regional wall motion abnormalities. The left ventricular internal cavity  size was normal in size. There is   moderate concentric left ventricular hypertrophy. The interventricular  septum is flattened in systole and diastole, consistent with right  ventricular pressure and volume overload. Left ventricular diastolic  parameters are consistent  with Grade II  diastolic dysfunction (pseudonormalization).   Right Ventricle: The right ventricular size is mildly enlarged. Moderately  increased right ventricular wall thickness. Right ventricular systolic  function is severely reduced. There is severely elevated pulmonary artery  systolic pressure. The tricuspid  regurgitant velocity is 3.73 m/s, and with an assumed right atrial  pressure of 8 mmHg, the estimated right ventricular systolic pressure is  73.2 mmHg.   Left Atrium: Left atrial size was moderately dilated.   Right Atrium: Right atrial size was moderately dilated.   Pericardium: Trivial pericardial effusion is present.   Mitral Valve: The mitral valve is abnormal. There is mild thickening of  the mitral valve leaflet(s). There is mild calcification of the mitral  valve leaflet(s). Mild mitral valve regurgitation. No evidence of mitral  valve stenosis.   Tricuspid Valve: The tricuspid valve is normal in structure. Tricuspid  valve regurgitation is mild . No evidence of tricuspid stenosis.   Aortic Valve: Partially fused NCC and LCC. The aortic valve is tricuspid.  Aortic valve regurgitation is not visualized. Aortic valve  sclerosis/calcification is present, without any evidence of aortic  stenosis. Aortic valve peak gradient measures 12.1  mmHg.   Pulmonic Valve: The pulmonic valve was grossly normal. Pulmonic valve  regurgitation is mild. No evidence of pulmonic stenosis.   Aorta: The aortic root, ascending aorta, aortic arch and descending aorta  are all structurally normal, with no evidence of dilitation or  obstruction.   Venous: The inferior vena cava is dilated in size with greater  than 50%  respiratory variability, suggesting right atrial pressure of 8 mmHg.   Echocardiogram 06/18/2020    1. Left ventricular ejection fraction, by estimation, is 55 to 60%. The  left ventricle has normal function. The left ventricle has no regional  wall motion  abnormalities. There is moderate left ventricular hypertrophy.  Left ventricular diastolic  parameters are consistent with Grade II diastolic dysfunction  (pseudonormalization). Elevated left ventricular end-diastolic pressure.  There is the interventricular septum is flattened in diastole ('D' shaped  left ventricle), consistent with right  ventricular volume overload and the interventricular septum is flattened  in systole, consistent with right ventricular pressure overload.   2. Right ventricular systolic function is severely reduced. The right  ventricular size is mildly enlarged. There is severely elevated pulmonary  artery systolic pressure. The estimated right ventricular systolic  pressure is 02.4 mmHg.   3. Left atrial size was mildly dilated.   4. Large pleural effusion in the left lateral region.   5. The mitral valve is grossly normal. Mild mitral valve regurgitation.   6. Tricuspid valve regurgitation is moderate.   7. The NCC and the LCC are fused. . The aortic valve is tricuspid. Aortic  valve regurgitation is not visualized. Mild aortic valve stenosis.   8. The inferior vena cava is dilated in size with <50% respiratory  variability, suggesting right atrial pressure of 15 mmHg.     Laboratory Data:  High Sensitivity Troponin:  No results for input(s): "TROPONINIHS" in the last 720 hours.    ChemistryNo results for input(s): "NA", "K", "CL", "CO2", "GLUCOSE", "BUN", "CREATININE", "CALCIUM", "MG", "GFRNONAA", "GFRAA", "ANIONGAP" in the last 168 hours.  No results for input(s): "PROT", "ALBUMIN", "AST", "ALT", "ALKPHOS", "BILITOT" in the last 168 hours. Lipids No results for input(s): "CHOL", "TRIG", "HDL", "LABVLDL", "LDLCALC", "CHOLHDL" in the last 168 hours. HematologyNo results for input(s): "WBC", "RBC", "HGB", "HCT", "MCV", "MCH", "MCHC", "RDW", "PLT" in the last 168 hours. Thyroid No results for input(s): "TSH", "FREET4" in the last 168 hours. BNPNo results for  input(s): "BNP", "PROBNP" in the last 168 hours.  DDimer No results for input(s): "DDIMER" in the last 168 hours.   Radiology/Studies:  No results found.   Assessment and Plan:   Acute bradycardia to 26 with hypotension- responded to atropine and is on coreg 6.25 BID, stop coreg, amlodipine, hold lasix and entresto for now.  See Dr. Marsa Aris note  admit to ICU for close monitoring.  Hypotension rec'd IV fluids 400 cc by EMS   labs pending DM-2 hold meds use SSI  glucose stable 097 Hx diastolic CHF but hold lasix now and will defer to Dr. Dellia Cloud  Hypothyroid on levothyroxine check TSH and free T4.  Continue ASA for now.   CAD with CABG in 2015  ( LIMA to LAD, VG to PDA, seq VG to OM1 and OM2 ) monitor troponin EKG with abnormal but may be due to rate and chronic RBBB.   Risk Assessment/Risk Scores:    TIMI Risk Score for Unstable Angina or Non-ST Elevation MI:   The patient's TIMI risk score is  , which indicates a  % risk of all cause mortality, new or recurrent myocardial infarction or need for urgent revascularization in the next 14 days.       Severity of Illness: The appropriate patient status for this patient is INPATIENT. Inpatient status is judged to be reasonable and necessary in order to provide the required intensity of service to ensure the patient's safety. The  patient's presenting symptoms, physical exam findings, and initial radiographic and laboratory data in the context of their chronic comorbidities is felt to place them at high risk for further clinical deterioration. Furthermore, it is not anticipated that the patient will be medically stable for discharge from the hospital within 2 midnights of admission.   * I certify that at the point of admission it is my clinical judgment that the patient will require inpatient hospital care spanning beyond 2 midnights from the point of admission due to high intensity of service, high risk for further deterioration and  high frequency of surveillance required.*   For questions or updates, please contact [facility] Please consult [email or web address] for contact info under     Signed, Cecilie Kicks, NP  02/01/2022 7:48 PM

## 2022-01-16 NOTE — Consult Note (Incomplete)
NAME:  Marissa Jefferson, MRN:  [id number], DOB:  Dec 19, 1924, LOS: 0 ADMISSION DATE:  01/12/2022, CONSULTATION DATE:  11/7 REFERRING MD:  Dr Dellia Cloud , CHIEF COMPLAINT:  Hyperkalemia   History of Present Illness:  88 year old female with PMH as below, which is significant for HFrEF (LVEF 20% in 2015 now recovered post CABG x 4 to 55%), DM, HTN, Hypothyroid, and now has diastolic CHF.  Her daughter tells me she has several admissions in the past for congestive heart failure during which she has required IV diuresis.  Most recently she was discharged on home oxygen and wears 4 L continuously.  She presented to [name] ED 11/7 with complaints of bradycardia and hypotension.  She lives at home with assistance from a home health aide who came to check on her on the day of presentation and found oxygen saturation to be 75%.  EMS was called and upon their arrival she was also noted to be bradycardic into the 20s and hypotensive with systolic blood pressures in the 80s.  EMS treated with 1 mg of atropine and heart rate improved into the 50s with subsequent blood pressure improvement.  EKG in the ED was questionable for junctional escape versus slow atrial fibrillation.  Cardiology was consulted and admitted the patient to ICU with dopamine infusion and EP consultation.  Chemistries then resulted and showed potassium 6.8.  PCCM was consulted for hyperkalemia.  Pertinent  Medical History   has a past medical history of Acute CHF (congestive heart failure) ([place]) (06/17/2020), Arthritis, Breast cancer ([place]), CAD (coronary artery disease), native coronary artery (10/06/2013), CHF (congestive heart failure) ([place]) (06/17/2020), Chronic combined systolic and diastolic CHF, NYHA class 2 ([place]) (09/2013), CVA (cerebral vascular accident) ([place]) (1968), DM2 (diabetes mellitus, type 2) ([place]), GERD (gastroesophageal reflux disease), Hyperlipidemia, Hypertension, Hypothyroidism, Ischemic dilated cardiomyopathy ([place]) (09/2013), NSVT  (nonsustained ventricular tachycardia) ([place]) (09/2013), OAB (overactive bladder), Osteopenia, and 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   Significant Hospital Events: Including procedures, antibiotic start and stop dates in addition to other pertinent events   11/7 admit with bradycardia, hyperkalemia  Interim History / Subjective:    Objective   Blood pressure (!) 118/53, pulse 65, temperature 97.6 F (36.4 C), temperature source Oral, resp. rate 17, weight 54.4 kg, SpO2 95 %.       No intake or output data in the 24 hours ending 01/15/2022 2349 Filed Weights   02/07/2022 2015  Weight: 54.4 kg    Examination: General: Frail elderly female in NAD HENT: [place]/AT, PERRL Lungs: Coarse crackles, no distress Cardiovascular: RRR, no MRG. Rates in the 80s. Jugular venous distention noted.  Abdomen: Soft, distended. Non-tender.  Extremities: No acute deformity. No significant edema.  Neuro: Alert, oriented, non-focal  Resolved Hospital Problem list     Assessment & Plan:   Bradycardia: improved with atropine. Secondary to hyperkalemia. Hypothyroidism may be playing a  Cardiogenic shock secondary to bradycardia.  - Dopamine infusion continue per cardiology - Insulin, Bicarb, [place] to ICU for close monitoring.   Best Practice (right click and "Reselect all SmartList Selections" daily)   Diet/type: {diet type:25684} DVT prophylaxis: {anticoagulation (Optional):25687} GI prophylaxis: {XV:40086} Lines: {Central Venous Access:25771} Foley:  {Central Venous Access:25691} Code Status:  {Code Status:26939} Last date of multidisciplinary goals of care discussion [***]  Labs   CBC: Recent Labs  Lab 01/29/2022 1958 01/11/2022 2133  WBC 10.9* 11.1*  NEUTROABS 9.6*  --   HGB 16.5* 16.0*  HCT 48.9* 49.0*  MCV 107.2* 107.5*  PLT 179 119*    Basic Metabolic Panel: Recent Labs  Lab 01/19/2022 1958 01/15/2022 2133  NA 135  --   K  6.8*  --   CL 99  --   CO2 17*  --   GLUCOSE 232*  --   BUN 81*  --   CREATININE 2.11* 2.10*  CALCIUM 10.9*  --   MG 2.0  --    GFR: CrCl cannot be calculated (Unknown ideal weight.). Recent Labs  Lab 01/27/2022 1958 01/10/2022 2133  WBC 10.9* 11.1*    Liver Function Tests: Recent Labs  Lab 01/25/2022 1958  AST 42*  ALT 57*  ALKPHOS 51  BILITOT 0.7  PROT 6.0*  ALBUMIN 3.7   No results for input(s): "LIPASE", "AMYLASE" in the last 168 hours. No results for input(s): "AMMONIA" in the last 168 hours.  ABG    Component Value Date/Time   PHART 7.442 10/10/2013 0904   PCO2ART 35.2 10/10/2013 0904   PO2ART 115.0 (H) 10/10/2013 0904   HCO3 24.0 10/10/2013 0904   TCO2 22 10/10/2013 1642   ACIDBASEDEF 1.0 10/09/2013 2217   O2SAT 48.7 10/14/2013 1250     Coagulation Profile: No results for input(s): "INR", "PROTIME" in the last 168 hours.  Cardiac Enzymes: No results for input(s): "CKTOTAL", "CKMB", "CKMBINDEX", "TROPONINI" in the last 168 hours.  HbA1C: Hgb A1c MFr Bld  Date/Time Value Ref Range Status  01/20/2022 09:33 PM 8.2 (H) 4.8 - 5.6 % Final    Comment:    (NOTE) Pre diabetes:          5.7%-6.4%  Diabetes:              >6.4%  Glycemic control for   <7.0% adults with diabetes   07/10/2021 04:21 AM 7.3 (H) 4.8 - 5.6 % Final    Comment:    (NOTE) Pre diabetes:          5.7%-6.4%  Diabetes:              >6.4%  Glycemic control for   <7.0% adults with diabetes     CBG: Recent Labs  Lab 01/29/2022 2106  GLUCAP 98*    Review of Systems:   ***  Past Medical History:  She,  has a past medical history of Acute CHF (congestive heart failure) ([place]) (06/17/2020), Arthritis, Breast cancer ([place]), CAD (coronary artery disease), native coronary artery (10/06/2013), CHF (congestive heart failure) ([place]) (06/17/2020), Chronic combined systolic and diastolic CHF, NYHA class 2 ([place]) (09/2013), CVA (cerebral vascular accident) ([place]) (1968), DM2 (diabetes mellitus,  type 2) ([place]), GERD (gastroesophageal reflux disease), Hyperlipidemia, Hypertension, Hypothyroidism, Ischemic dilated cardiomyopathy ([place]) (09/2013), NSVT (nonsustained ventricular tachycardia) ([place]) (09/2013), OAB (overactive bladder), Osteopenia, and 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   Surgical History:   Past Surgical History:  Procedure Laterality Date  . BREAST LUMPECTOMY Left 1998  . BREAST LUMPECTOMY WITH AXILLARY LYMPH NODE DISSECTION Left 1998  . CARDIAC CATHETERIZATION  10/06/2013  . CATARACT EXTRACTION W/ INTRAOCULAR LENS  IMPLANT, BILATERAL Bilateral ~ 2009  . COLONOSCOPY  2005  . CORONARY ARTERY BYPASS GRAFT [name] 10/09/2013   Procedure: CORONARY ARTERY BYPASS GRAFTING (CABG) x 4 using left internal mammary artery and right saphenous leg vein using endoscope.;  Surgeon: Melrose Nakayama, MD;  Location: [facility];  Service: Open Heart Surgery;  Laterality: [name];  . INTRAOPERATIVE TRANSESOPHAGEAL ECHOCARDIOGRAM [name] 10/09/2013   Procedure: INTRAOPERATIVE TRANSESOPHAGEAL ECHOCARDIOGRAM;  Surgeon: Melrose Nakayama, MD;  Location:  [facility] OR;  Service: Open Heart Surgery;  Laterality: [name];  . LEFT AND RIGHT HEART CATHETERIZATION WITH CORONARY ANGIOGRAM [name] 10/06/2013   Procedure: LEFT AND RIGHT HEART CATHETERIZATION WITH CORONARY ANGIOGRAM;  Surgeon: Sanda Klein, MD;  Location: [facility] CATH LAB;  Service: Cardiovascular;  Laterality: [name];  . TONSILLECTOMY AND ADENOIDECTOMY  1930's     Social History:   reports that she quit smoking about 73 years ago. Her smoking use included cigarettes. She has a 0.75 pack-year smoking history. She has never used smokeless tobacco. She reports that she does not drink alcohol and does not use drugs.   Family History:  Her family history includes Asthma in her daughter and son; Cancer in her father; Colon cancer in her mother; Heart attack in her father.   Allergies Allergies  Allergen Reactions  .  Amiodarone Other (See Comments)    Bradycardia  . Rosuvastatin Nausea Only  . Sitagliptin Other (See Comments)    Caused urinary frequency     Home Medications  Prior to Admission medications   Medication Sig Start Date End Date Taking? Authorizing Provider  acetaminophen (TYLENOL) 500 MG tablet Take 500-1,000 mg by mouth See admin instructions. Take 1,000 mg by mouth in the morning & at bedtime and an additional 500 mg once a day as needed for pain/discomfort   Yes [provider]  amLODipine (NORVASC) 5 MG tablet Take 1 tablet (5 mg total) by mouth daily. 07/14/21  Yes [name], MD  aspirin EC 81 MG tablet Take 1 tablet (81 mg total) by mouth daily. 01/23/19  Yes Croitoru, Mihai, MD  atorvastatin (LIPITOR) 80 MG tablet Take 80 mg by mouth daily. 10/04/13  Yes [provider]  B Complex-Biotin-FA (SUPER B-50 COMPLEX PO) Take 1 tablet by mouth daily before lunch.   Yes [provider]  Biotin 5000 MCG SUBL Place 5,000 mcg under the tongue daily.   Yes [provider]  carvedilol (COREG) 25 MG tablet TAKE 1 TABLET BY MOUTH TWICE A DAY WITH MEALS Patient taking differently: Take 25 mg by mouth 2 (two) times daily with a meal. 05/15/21  Yes Croitoru, Mihai, MD  Cholecalciferol (VITAMIN D3) 125 MCG (5000 UT) CAPS Take 5,000 Units by mouth daily before lunch.   Yes [provider]  Cyanocobalamin (VITAMIN B-12) 5000 MCG SUBL Place 5,000 mcg under the tongue daily before lunch.   Yes [provider]  denosumab (PROLIA) 60 MG/ML SOSY injection Inject 60 mg into the skin every 6 (six) months.   Yes [provider]  furosemide (LASIX) 20 MG tablet Take 20 mg daily, add an additional 20 mg of days of weight gain over 120 lb or leg swelling Patient taking differently: Take 20 mg by mouth See admin instructions. Take 20 mg by mouth once a day and an additional 20 mg on days of weight gain over 120 lbs or leg swelling 07/20/21  Yes Warren Lacy, PA-C  levothyroxine (SYNTHROID, LEVOTHROID) 150 MCG tablet Take 150 mcg by mouth daily before breakfast. 12/06/14  Yes [provider]  metFORMIN (GLUCOPHAGE) 500 MG tablet Take 1,000 mg by mouth 2 (two) times daily.   Yes [provider]  OXYGEN Inhale 4 L/min into the lungs continuous.   Yes [provider]  sacubitril-valsartan (ENTRESTO) 24-26 MG Take 1 tablet by mouth 2 (two) times daily. 07/13/21  Yes [name], MD  solifenacin (VESICARE) 10 MG tablet Take 10 mg by mouth at bedtime.   Yes [provider]  VOLTAREN 1 % GEL Apply 2 g topically 2 (two) times daily as needed (for bilateral knee pain). 09/02/13  Yes [provider]  docusate sodium (COLACE) 100 MG capsule Take 100 mg by mouth at bedtime as needed for mild constipation. Patient not taking: Reported on 01/26/2022    [provider]  magnesium gluconate (MAGONATE) 500 MG tablet Take 500 mg by mouth every evening. Patient not taking: Reported on 01/31/2022    [provider]  potassium chloride (KLOR-CON M) 10 MEQ tablet Take 10 mEq by mouth daily. Patient not taking: Reported on 01/10/2022    [provider]  protein supplement shake (PREMIER PROTEIN) LIQD Take 237 mLs by mouth in the morning. Patient not taking: Reported on 01/28/2022    [provider]  potassium chloride (K-DUR) 10 MEQ tablet Take 0.5 tablets (5 mEq total) by mouth daily. 09/28/14 02/25/15  Croitoru, Dani Gobble, MD     Critical care time: ***

## 2022-01-16 NOTE — H&P (Deleted)
Cardiology Admission History and Physical   Patient ID: Marissa Jefferson MRN: [id number]; DOB: 02/09/1925   Admission date: 02/04/2022  PCP:  Merrilee Seashore, MD   [facility] Providers Cardiologist:  Sanda Klein, MD        Chief Complaint:  weakness  Patient Profile:   Marissa Jefferson is a 88 y.o. female with CAD -CABG 2426, chronic diastolic CHF, post op a fib without subsequent recurrence , HTN, DM-2, HLD, DM-2.Lt breast cancer with surgery and radiation who is being seen 02/01/2022 for the evaluation of sever bradycardia.  History of Present Illness:   Ms. Marissa Jefferson  with hx as above and EF in 2015 20-25% and NSVT, post CABG ( LIMA to LAD, VG to PDA, seq VG to OM1 and OM2 ) EF to 55%.  Hx of diastolic CHF in 10/3417 and again 06/2021, diuresed both times. She has been on amiodarone in past.  Her daughter is a Software engineer. Pt on 02 2 L at home.  Baseline wt 118  --home meds in May 2023 coreg 25 BID, no longer on amiodarone, + amlodipine 5 mg  Echo 07/2021  EF 55-60%, no RWMA, moderate concentric LVH, G2 DD  There is the interventricular septum is flattened  in systole and diastole, consistent with right ventricular pressure and  volume overload.  RV systolic function is severely reduced, RV size is mildly enlarged.  There is severely elevated pulmonary artery  systolic pressure. The estimated right ventricular systolic pressure is 62.2 mmHg. LA and RA mod. Dilated.  Mild MR no AR, no AS    Now presents with decreased sp02 at home 57% on RA, HR 26 BP 82/40.  She was given 400 cc NS, and with no response 1 mg IV atropine with HR improved to 50s after 1 mg.    EKG:  The ECG that was done 01/30/2022 was personally reviewed and demonstrates junctional vs atrial fib with RBBB (chronic) and LAFB HR 53 follow up with ST changes of post MI and read as atrial fib though P waves present at times. PCXR IMPRESSION: Mild vascular congestion possible mild fluid overload  Bilateral pleural  effusions and left lower lobe atelectasis.  BP 92/46 P 53 R 13-19  T 97.4  sp02 on RA 96-92%      Past Medical History:  Diagnosis Date   Acute CHF (congestive heart failure) ([place]) 06/17/2020   Arthritis    "knees" (10/08/2013)   Breast cancer (Marissa Jefferson)    "left; I had 62 sessions of radiation"   CAD (coronary artery disease), native coronary artery 10/06/2013   LAD 90%, CFX 90%, RCA 100% w/ collat   CHF (congestive heart failure) ([place]) 06/17/2020   Chronic combined systolic and diastolic CHF, NYHA class 2 ([place]) 09/2013   CVA (cerebral vascular accident) ([place]) 1968   leaving no deficit   DM2 (diabetes mellitus, type 2) ([facility])    GERD (gastroesophageal reflux disease)    Hyperlipidemia    Hypertension    Hypothyroidism    Ischemic dilated cardiomyopathy ([place]) 09/2013   EF 20-25% by echo   NSVT (nonsustained ventricular tachycardia) ([place]) 09/2013   OAB (overactive bladder)    Osteopenia    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) 05/10/2014    Past Surgical History:  Procedure Laterality Date   BREAST LUMPECTOMY Left 1998   BREAST LUMPECTOMY WITH AXILLARY LYMPH NODE DISSECTION Left 1998   CARDIAC CATHETERIZATION  10/06/2013   CATARACT EXTRACTION W/ INTRAOCULAR LENS  IMPLANT, BILATERAL Bilateral ~  2009   COLONOSCOPY  2005   CORONARY ARTERY BYPASS GRAFT [name] 10/09/2013   Procedure: CORONARY ARTERY BYPASS GRAFTING (CABG) x 4 using left internal mammary artery and right saphenous leg vein using endoscope.;  Surgeon: Melrose Nakayama, MD;  Location: [facility];  Service: Open Heart Surgery;  Laterality: [name];   INTRAOPERATIVE TRANSESOPHAGEAL ECHOCARDIOGRAM [name] 10/09/2013   Procedure: INTRAOPERATIVE TRANSESOPHAGEAL ECHOCARDIOGRAM;  Surgeon: Melrose Nakayama, MD;  Location: [facility];  Service: Open Heart Surgery;  Laterality: [name];   LEFT AND RIGHT HEART CATHETERIZATION WITH CORONARY ANGIOGRAM [name] 10/06/2013   Procedure: LEFT AND RIGHT HEART CATHETERIZATION WITH  CORONARY ANGIOGRAM;  Surgeon: Sanda Klein, MD;  Location: [facility];  Service: Cardiovascular;  Laterality: [name];   TONSILLECTOMY AND ADENOIDECTOMY  1930's     Medications Prior to Admission: Prior to Admission medications   Medication Sig Start Date End Date Taking? Authorizing Provider  acetaminophen (TYLENOL) 500 MG tablet Take 1,000 mg by mouth See admin instructions. Take 1,000 mg by mouth in the morning & at bedtime and an additional 500 mg once a day as needed for pain/discomfort    [provider]  amLODipine (NORVASC) 5 MG tablet Take 1 tablet (5 mg total) by mouth daily. 07/14/21   [name], MD  aspirin EC 81 MG tablet Take 1 tablet (81 mg total) by mouth daily. 01/23/19   Croitoru, Mihai, MD  atorvastatin (LIPITOR) 80 MG tablet Take 80 mg by mouth daily. 10/04/13   [provider]  B Complex-Biotin-FA (SUPER B-50 COMPLEX PO) Take 1 tablet by mouth daily before lunch.    [provider]  Biotin 5000 MCG SUBL Place 5,000 mcg under the tongue daily.    [provider]  carvedilol (COREG) 25 MG tablet TAKE 1 TABLET BY MOUTH TWICE A DAY WITH MEALS Patient taking differently: Take 25 mg by mouth 2 (two) times daily with a meal. 05/15/21   Croitoru, Mihai, MD  Cholecalciferol (VITAMIN D3) 125 MCG (5000 UT) CAPS Take 5,000 Units by mouth daily before lunch.    [provider]  Cyanocobalamin (VITAMIN B-12) 5000 MCG SUBL Place 5,000 mcg under the tongue daily before lunch.    [provider]  denosumab (PROLIA) 60 MG/ML SOSY injection Inject 60 mg into the skin every 6 (six) months.    [provider]  docusate sodium (COLACE) 100 MG capsule Take 100 mg by mouth 2 (two) times daily.    [provider]  furosemide (LASIX) 20 MG tablet Take 20 mg daily, add an additional 20 mg of days of weight gain over 120 lb or leg swelling 07/20/21   Warren Lacy, PA-C  levothyroxine (SYNTHROID, LEVOTHROID) 150 MCG tablet Take  150 mcg by mouth daily before breakfast. 12/06/14   [provider]  magnesium gluconate (MAGONATE) 500 MG tablet Take 500 mg by mouth every evening.    [provider]  metFORMIN (GLUCOPHAGE) 500 MG tablet Take 1,000 mg by mouth 2 (two) times daily.    [provider]  OXYGEN Inhale 2-3 L into the lungs continuous.    [provider]  potassium chloride (KLOR-CON M) 10 MEQ tablet Take 10 mEq by mouth daily.    [provider]  protein supplement shake (PREMIER PROTEIN) LIQD Take 237 mLs by mouth in the morning.    [provider]  sacubitril-valsartan (ENTRESTO) 24-26 MG Take 1 tablet by mouth 2 (two) times daily. 07/13/21   [name], MD  solifenacin ([place]) 10  MG tablet Take 10 mg by mouth at bedtime.    [provider]  VOLTAREN 1 % GEL Apply 2 g topically 2 (two) times daily as needed (for bilateral knee pain). 09/02/13   [provider]  potassium chloride (K-DUR) 10 MEQ tablet Take 0.5 tablets (5 mEq total) by mouth daily. 09/28/14 02/25/15  Croitoru, Dani Gobble, MD     Allergies:    Allergies  Allergen Reactions   Amiodarone Other (See Comments)    Bradycardia   Crestor [Rosuvastatin] Nausea Only   Sitagliptin Other (See Comments)    Caused urinary frequency    Social History:   Social History   Socioeconomic History   Marital status: Widowed    Spouse name: Not on file   Number of children: 2   Years of education: Not on file   Highest education level: High school graduate  Occupational History   Occupation: [employer]  Tobacco Use   Smoking status: Former    Packs/day: 0.25    Years: 3.00    Total pack years: 0.75    Types: Cigarettes    Quit date: 03/12/1948    Years since quitting: 73.8   Smokeless tobacco: Never   Tobacco comments:    smoked in college   Vaping Use   Vaping Use: Never used  Substance and Sexual Activity   Alcohol use: No   Drug use: No   Sexual activity: Not Currently   Other Topics Concern   Not on file  Social History Narrative   Not on file   Social Determinants of Health   Financial Resource Strain: Not on file  Food Insecurity: No Food Insecurity (07/11/2021)   Hunger Vital Sign    Worried About Running Out of Food in the Last Year: Never true    [place] in the Last Year: Never true  Transportation Needs: No Transportation Needs (07/11/2021)   PRAPARE - Hydrologist (Medical): No    Lack of Transportation (Non-Medical): No  Physical Activity: Not on file  Stress: Not on file  Social Connections: Not on file  Intimate Partner Violence: Not on file    Family History:   The patient's family history includes Asthma in her daughter and son; Cancer in her father; Colon cancer in her mother; Heart attack in her father.    ROS:  Please see the history of present illness.  General:no colds or fevers, no weight changes + weakness Skin:no rashes or ulcers HEENT:no blurred vision, no congestion CV:see HPI PUL:see HPI GI:no diarrhea constipation or melena, no indigestion GU:no hematuria, no dysuria MS:no joint pain, no claudication Neuro:no syncope, no lightheadedness Endo:+ diabetes CBG 217 , no thyroid disease All other ROS reviewed and negative.     Physical Exam/Data:   Vitals:   01/28/2022 1925 01/29/2022 1929 01/12/2022 1930  BP: (!) 81/51 (!) 81/51 (!) 79/49  Pulse:  (!) 53 86  Resp:  18 19  Temp:  (!) 97.4 F (36.3 C)   TempSrc:  Oral   SpO2:  100% 100%   No intake or output data in the 24 hours ending 01/14/2022 1948    07/20/2021   11:29 AM 07/12/2021    5:00 AM 07/11/2021    2:57 AM  Last 3 Weights  Weight (lbs) 122 lb 12.8 oz 128 lb 1.4 oz 129 lb 6.6 oz  Weight (kg) 55.702 kg 58.1 kg 58.7 kg     There is no height or weight on  file to calculate BMI.   Exam per Dr. Dellia Cloud     Relevant CV Studies:  Echo 07/10/21 IMPRESSIONS     1. Left ventricular ejection fraction, by estimation, is  55 to 60%. The  left ventricle has normal function. The left ventricle has no regional  wall motion abnormalities. There is moderate concentric left ventricular  hypertrophy. Left ventricular  diastolic parameters are consistent with Grade II diastolic dysfunction  (pseudonormalization). There is the interventricular septum is flattened  in systole and diastole, consistent with right ventricular pressure and  volume overload.   2. Right ventricular systolic function is severely reduced. The right  ventricular size is mildly enlarged. Moderately increased right  ventricular wall thickness. There is severely elevated pulmonary artery  systolic pressure. The estimated right  ventricular systolic pressure is 47.4 mmHg.   3. Left atrial size was moderately dilated.   4. Right atrial size was moderately dilated.   5. The mitral valve is abnormal. Mild mitral valve regurgitation. No  evidence of mitral stenosis.   6. The aortic valve is tricuspid. Aortic valve regurgitation is not  visualized. Aortic valve sclerosis/calcification is present, without any  evidence of aortic stenosis.   7. The inferior vena cava is dilated in size with >50% respiratory  variability, suggesting right atrial pressure of 8 mmHg.   8. Cannot exclude a small PFO.   Comparison(s): No significant change from prior study.   Conclusion(s)/Recommendation(s): Severe pulmonary hypertension with RV  pressure/volume overload.   FINDINGS   Left Ventricle: Left ventricular ejection fraction, by estimation, is 55  to 60%. The left ventricle has normal function. The left ventricle has no  regional wall motion abnormalities. The left ventricular internal cavity  size was normal in size. There is   moderate concentric left ventricular hypertrophy. The interventricular  septum is flattened in systole and diastole, consistent with right  ventricular pressure and volume overload. Left ventricular diastolic  parameters are  consistent with Grade II  diastolic dysfunction (pseudonormalization).   Right Ventricle: The right ventricular size is mildly enlarged. Moderately  increased right ventricular wall thickness. Right ventricular systolic  function is severely reduced. There is severely elevated pulmonary artery  systolic pressure. The tricuspid  regurgitant velocity is 3.73 m/s, and with an assumed right atrial  pressure of 8 mmHg, the estimated right ventricular systolic pressure is  25.9 mmHg.   Left Atrium: Left atrial size was moderately dilated.   Right Atrium: Right atrial size was moderately dilated.   Pericardium: Trivial pericardial effusion is present.   Mitral Valve: The mitral valve is abnormal. There is mild thickening of  the mitral valve leaflet(s). There is mild calcification of the mitral  valve leaflet(s). Mild mitral valve regurgitation. No evidence of mitral  valve stenosis.   Tricuspid Valve: The tricuspid valve is normal in structure. Tricuspid  valve regurgitation is mild . No evidence of tricuspid stenosis.   Aortic Valve: Partially fused NCC and LCC. The aortic valve is tricuspid.  Aortic valve regurgitation is not visualized. Aortic valve  sclerosis/calcification is present, without any evidence of aortic  stenosis. Aortic valve peak gradient measures 12.1  mmHg.   Pulmonic Valve: The pulmonic valve was grossly normal. Pulmonic valve  regurgitation is mild. No evidence of pulmonic stenosis.   Aorta: The aortic root, ascending aorta, aortic arch and descending aorta  are all structurally normal, with no evidence of dilitation or  obstruction.   Venous: The inferior vena cava is dilated in size with greater  than 50%  respiratory variability, suggesting right atrial pressure of 8 mmHg.   Echocardiogram 06/18/2020    1. Left ventricular ejection fraction, by estimation, is 55 to 60%. The  left ventricle has normal function. The left ventricle has no regional  wall  motion abnormalities. There is moderate left ventricular hypertrophy.  Left ventricular diastolic  parameters are consistent with Grade II diastolic dysfunction  (pseudonormalization). Elevated left ventricular end-diastolic pressure.  There is the interventricular septum is flattened in diastole ('D' shaped  left ventricle), consistent with right  ventricular volume overload and the interventricular septum is flattened  in systole, consistent with right ventricular pressure overload.   2. Right ventricular systolic function is severely reduced. The right  ventricular size is mildly enlarged. There is severely elevated pulmonary  artery systolic pressure. The estimated right ventricular systolic  pressure is 81.8 mmHg.   3. Left atrial size was mildly dilated.   4. Large pleural effusion in the left lateral region.   5. The mitral valve is grossly normal. Mild mitral valve regurgitation.   6. Tricuspid valve regurgitation is moderate.   7. The NCC and the LCC are fused. . The aortic valve is tricuspid. Aortic  valve regurgitation is not visualized. Mild aortic valve stenosis.   8. The inferior vena cava is dilated in size with <50% respiratory  variability, suggesting right atrial pressure of 15 mmHg.     Laboratory Data:  High Sensitivity Troponin:  No results for input(s): "TROPONINIHS" in the last 720 hours.    ChemistryNo results for input(s): "NA", "K", "CL", "CO2", "GLUCOSE", "BUN", "CREATININE", "CALCIUM", "MG", "GFRNONAA", "GFRAA", "ANIONGAP" in the last 168 hours.  No results for input(s): "PROT", "ALBUMIN", "AST", "ALT", "ALKPHOS", "BILITOT" in the last 168 hours. Lipids No results for input(s): "CHOL", "TRIG", "HDL", "LABVLDL", "LDLCALC", "CHOLHDL" in the last 168 hours. HematologyNo results for input(s): "WBC", "RBC", "HGB", "HCT", "MCV", "MCH", "MCHC", "RDW", "PLT" in the last 168 hours. Thyroid No results for input(s): "TSH", "FREET4" in the last 168 hours. BNPNo results  for input(s): "BNP", "PROBNP" in the last 168 hours.  DDimer No results for input(s): "DDIMER" in the last 168 hours.   Radiology/Studies:  No results found.   Assessment and Plan:   Acute bradycardia to 26 with hypotension- responded to atropine and is on coreg 6.25 BID, stop coreg, amlodipine, hold lasix and entresto for now.  See Dr. Marsa Aris note  admit to ICU for close monitoring.  Hypotension rec'd IV fluids 400 cc by EMS   labs pending DM-2 hold meds use SSI  glucose stable 299 Hx diastolic CHF but hold lasix now and will defer to Dr. Dellia Cloud  Hypothyroid on levothyroxine check TSH and free T4.  Continue ASA for now.   CAD with CABG in 2015  ( LIMA to LAD, VG to PDA, seq VG to OM1 and OM2 ) monitor troponin EKG with abnormal but may be due to rate and chronic RBBB.   Risk Assessment/Risk Scores:    TIMI Risk Score for Unstable Angina or Non-ST Elevation MI:   The patient's TIMI risk score is  , which indicates a  % risk of all cause mortality, new or recurrent myocardial infarction or need for urgent revascularization in the next 14 days.       Severity of Illness: The appropriate patient status for this patient is INPATIENT. Inpatient status is judged to be reasonable and necessary in order to provide the required intensity of service to ensure the patient's safety. The  patient's presenting symptoms, physical exam findings, and initial radiographic and laboratory data in the context of their chronic comorbidities is felt to place them at high risk for further clinical deterioration. Furthermore, it is not anticipated that the patient will be medically stable for discharge from the hospital within 2 midnights of admission.   * I certify that at the point of admission it is my clinical judgment that the patient will require inpatient hospital care spanning beyond 2 midnights from the point of admission due to high intensity of service, high risk for further deterioration  and high frequency of surveillance required.*   For questions or updates, please contact [facility] Please consult [email or web address] for contact info under     Signed, Cecilie Kicks, NP  01/24/2022 7:48 PM

## 2022-01-16 NOTE — Consult Note (Signed)
NAME:  Marissa Jefferson, MRN:  [id number], DOB:  1924/08/21, LOS: 0 ADMISSION DATE:  01/15/2022, CONSULTATION DATE:  11/7 REFERRING MD:  Dr Marissa Jefferson , CHIEF COMPLAINT:  Hyperkalemia   History of Present Illness:  88 year old female with PMH as below, which is significant for HFrEF (LVEF 20% in 2015 now recovered post CABG x 4 to 55%), DM, HTN, Hypothyroid, and now has diastolic CHF.  Her daughter tells me she has several admissions in the past for congestive heart failure during which she has required IV diuresis.  Most recently she was discharged on home oxygen and wears 4 L continuously.  She presented to [name] ED 11/7 with complaints of bradycardia and hypotension.  She lives at home with assistance from a home health aide who came to check on her on the day of presentation and found oxygen saturation to be 75%.  EMS was called and upon their arrival she was also noted to be bradycardic into the 20s and hypotensive with systolic blood pressures in the 80s.  EMS treated with 1 mg of atropine and heart rate improved into the 50s with subsequent blood pressure improvement.  EKG in the ED was questionable for junctional escape versus slow atrial fibrillation.  Cardiology was consulted and admitted the patient to ICU with dopamine infusion and EP consultation.  Chemistries then resulted and showed potassium 6.8.  PCCM was consulted for hyperkalemia.  Pertinent  Medical History   has a past medical history of Acute CHF (congestive heart failure) ([place]) (06/17/2020), Arthritis, Breast cancer ([place]), CAD (coronary artery disease), native coronary artery (10/06/2013), CHF (congestive heart failure) ([place]) (06/17/2020), Chronic combined systolic and diastolic CHF, NYHA class 2 ([place]) (09/2013), CVA (cerebral vascular accident) ([place]) (1968), DM2 (diabetes mellitus, type 2) ([place]), GERD (gastroesophageal reflux disease), Hyperlipidemia, Hypertension, Hypothyroidism, Ischemic dilated cardiomyopathy ([place]) (09/2013), NSVT  (nonsustained ventricular tachycardia) ([place]) (09/2013), OAB (overactive bladder), Osteopenia, and 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   Significant Hospital Events: Including procedures, antibiotic start and stop dates in addition to other pertinent events   11/7 admit with bradycardia, hyperkalemia  Interim History / Subjective:    Objective   Blood pressure (!) 118/53, pulse 65, temperature 97.6 F (36.4 C), temperature source Oral, resp. rate 17, weight 54.4 kg, SpO2 95 %.       No intake or output data in the 24 hours ending 01/19/2022 2349 Filed Weights   01/24/2022 2015  Weight: 54.4 kg    Examination: General: Frail elderly female in NAD HENT: [place]/AT, PERRL Lungs: Coarse crackles, no distress Cardiovascular: RRR, no MRG. Rates in the 80s. Jugular venous distention noted.  Abdomen: Soft, distended. Non-tender.  Extremities: No acute deformity. No significant edema.  Neuro: Alert, oriented, non-focal  Resolved Hospital Problem list     Assessment & Plan:   Symptomatic Bradycardia: improved with atropine. Secondary to hyperkalemia. Hypothyroidism may be playing a role as well. TSH 32. T4 pending.  Cardiogenic shock secondary to bradycardia.  - Dopamine infusion continue per cardiology -  Treat hyperkalemia - Admit to ICU for close monitoring.   AKI: increased lasix dosing recently due to weight gain vs ARB related vs secondary to bradycardia/hypotension. Baseline creatinine 0.8 Hyperkalemia - trend BMP - insulin, bicarb to temporize K - Lokelma  Hypothyroid: history of Hashimoto's. Takes 150 mcg levothyroxine daily. TSH 32.  - free T4 pending - Will give one time dose of levothyroxine now and continue home dose. Plan may change pending T4 result.  Acute on chronic HFpEF - Diuresis per cardiology  Chronic hypoxemic respiratory failure - continue home O2 4 L - O2 sat goal 90-98%  HTN HLD - Holding home  amlodipine, entresto, lasix, coreg.  - Continue asa  DM2 - CBG monitoring and SSI   Best Practice (right click and "Reselect all SmartList Selections" daily)   Diet/type: Regular consistency (see orders) DVT prophylaxis: prophylactic heparin  GI prophylaxis: [name] Lines: [name] Foley:  [name] Code Status:  full code Last date of multidisciplinary goals of care discussion '[ ]'$   Labs   CBC: Recent Labs  Lab 01/23/2022 1958 01/26/2022 2133  WBC 10.9* 11.1*  NEUTROABS 9.6*  --   HGB 16.5* 16.0*  HCT 48.9* 49.0*  MCV 107.2* 107.5*  PLT 179 119*    Basic Metabolic Panel: Recent Labs  Lab 01/28/2022 1958 02/01/2022 2133  NA 135  --   K 6.8*  --   CL 99  --   CO2 17*  --   GLUCOSE 232*  --   BUN 81*  --   CREATININE 2.11* 2.10*  CALCIUM 10.9*  --   MG 2.0  --    GFR: CrCl cannot be calculated (Unknown ideal weight.). Recent Labs  Lab 01/24/2022 1958 01/31/2022 2133  WBC 10.9* 11.1*    Liver Function Tests: Recent Labs  Lab 01/18/2022 1958  AST 42*  ALT 57*  ALKPHOS 51  BILITOT 0.7  PROT 6.0*  ALBUMIN 3.7   No results for input(s): "LIPASE", "AMYLASE" in the last 168 hours. No results for input(s): "AMMONIA" in the last 168 hours.  ABG    Component Value Date/Time   PHART 7.442 10/10/2013 0904   PCO2ART 35.2 10/10/2013 0904   PO2ART 115.0 (H) 10/10/2013 0904   HCO3 24.0 10/10/2013 0904   TCO2 22 10/10/2013 1642   ACIDBASEDEF 1.0 10/09/2013 2217   O2SAT 48.7 10/14/2013 1250     Coagulation Profile: No results for input(s): "INR", "PROTIME" in the last 168 hours.  Cardiac Enzymes: No results for input(s): "CKTOTAL", "CKMB", "CKMBINDEX", "TROPONINI" in the last 168 hours.  HbA1C: Hgb A1c MFr Bld  Date/Time Value Ref Range Status  02/05/2022 09:33 PM 8.2 (H) 4.8 - 5.6 % Final    Comment:    (NOTE) Pre diabetes:          5.7%-6.4%  Diabetes:              >6.4%  Glycemic control for   <7.0% adults with diabetes   07/10/2021 04:21 AM 7.3 (H) 4.8 - 5.6 %  Final    Comment:    (NOTE) Pre diabetes:          5.7%-6.4%  Diabetes:              >6.4%  Glycemic control for   <7.0% adults with diabetes     CBG: Recent Labs  Lab 01/30/2022 2106  GLUCAP 201*    Review of Systems:   Bolds are positive  Constitutional: weight loss, gain, night sweats, Fevers, chills, fatigue .  HEENT: headaches, Sore throat, sneezing, nasal congestion, post nasal drip, Difficulty swallowing, Tooth/dental problems, visual complaints visual changes, ear ache CV:  chest pain, radiates:,Orthopnea, PND, swelling in lower extremities**, dizziness, palpitations, syncope.  GI  heartburn, indigestion, abdominal pain, nausea, vomiting, diarrhea, change in bowel habits, loss of appetite, bloody stools. distention Resp: cough, productive: , hemoptysis, dyspnea, chest pain, pleuritic.  Skin: rash or itching or icterus GU: dysuria, change in color of urine, urgency or  frequency. flank pain, hematuria  MS: joint pain or swelling. decreased range of motion  Psych: change in mood or affect. depression or anxiety.  Neuro: difficulty with speech, weakness, numbness, ataxia    Past Medical History:  She,  has a past medical history of Acute CHF (congestive heart failure) ([place]) (06/17/2020), Arthritis, Breast cancer ([place]), CAD (coronary artery disease), native coronary artery (10/06/2013), CHF (congestive heart failure) ([place]) (06/17/2020), Chronic combined systolic and diastolic CHF, NYHA class 2 ([place]) (09/2013), CVA (cerebral vascular accident) ([place]) (1968), DM2 (diabetes mellitus, type 2) ([place]), GERD (gastroesophageal reflux disease), Hyperlipidemia, Hypertension, Hypothyroidism, Ischemic dilated cardiomyopathy ([place]) (09/2013), NSVT (nonsustained ventricular tachycardia) ([place]) (09/2013), OAB (overactive bladder), Osteopenia, and 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   Surgical History:   Past Surgical History:  Procedure Laterality  Date   BREAST LUMPECTOMY Left 1998   BREAST LUMPECTOMY WITH AXILLARY LYMPH NODE DISSECTION Left 1998   CARDIAC CATHETERIZATION  10/06/2013   CATARACT EXTRACTION W/ INTRAOCULAR LENS  IMPLANT, BILATERAL Bilateral ~ 2009   COLONOSCOPY  2005   CORONARY ARTERY BYPASS GRAFT [name] 10/09/2013   Procedure: CORONARY ARTERY BYPASS GRAFTING (CABG) x 4 using left internal mammary artery and right saphenous leg vein using endoscope.;  Surgeon: Melrose Nakayama, MD;  Location: [facility];  Service: Open Heart Surgery;  Laterality: [name];   INTRAOPERATIVE TRANSESOPHAGEAL ECHOCARDIOGRAM [name] 10/09/2013   Procedure: INTRAOPERATIVE TRANSESOPHAGEAL ECHOCARDIOGRAM;  Surgeon: Melrose Nakayama, MD;  Location: [facility];  Service: Open Heart Surgery;  Laterality: [name];   LEFT AND RIGHT HEART CATHETERIZATION WITH CORONARY ANGIOGRAM [name] 10/06/2013   Procedure: LEFT AND RIGHT HEART CATHETERIZATION WITH CORONARY ANGIOGRAM;  Surgeon: Sanda Klein, MD;  Location: [facility] CATH LAB;  Service: Cardiovascular;  Laterality: [name];   TONSILLECTOMY AND ADENOIDECTOMY  1930's     Social History:   reports that she quit smoking about 73 years ago. Her smoking use included cigarettes. She has a 0.75 pack-year smoking history. She has never used smokeless tobacco. She reports that she does not drink alcohol and does not use drugs.   Family History:  Her family history includes Asthma in her daughter and son; Cancer in her father; Colon cancer in her mother; Heart attack in her father.   Allergies Allergies  Allergen Reactions   Amiodarone Other (See Comments)    Bradycardia   Rosuvastatin Nausea Only   Sitagliptin Other (See Comments)    Caused urinary frequency     Home Medications  Prior to Admission medications   Medication Sig Start Date End Date Taking? Authorizing Provider  acetaminophen (TYLENOL) 500 MG tablet Take 500-1,000 mg by mouth See admin instructions. Take 1,000 mg by mouth in the morning & at bedtime and an additional 500  mg once a day as needed for pain/discomfort   Yes [provider]  amLODipine (NORVASC) 5 MG tablet Take 1 tablet (5 mg total) by mouth daily. 07/14/21  Yes [name], MD  aspirin EC 81 MG tablet Take 1 tablet (81 mg total) by mouth daily. 01/23/19  Yes Croitoru, Mihai, MD  atorvastatin (LIPITOR) 80 MG tablet Take 80 mg by mouth daily. 10/04/13  Yes [provider]  B Complex-Biotin-FA (SUPER B-50 COMPLEX PO) Take 1 tablet by mouth daily before lunch.   Yes [provider]  Biotin 5000 MCG SUBL Place 5,000 mcg under the tongue daily.   Yes [provider]  carvedilol (COREG) 25 MG tablet TAKE 1 TABLET BY MOUTH TWICE A DAY WITH MEALS Patient  taking differently: Take 25 mg by mouth 2 (two) times daily with a meal. 05/15/21  Yes Croitoru, Mihai, MD  Cholecalciferol (VITAMIN D3) 125 MCG (5000 UT) CAPS Take 5,000 Units by mouth daily before lunch.   Yes [provider]  Cyanocobalamin (VITAMIN B-12) 5000 MCG SUBL Place 5,000 mcg under the tongue daily before lunch.   Yes [provider]  denosumab (PROLIA) 60 MG/ML SOSY injection Inject 60 mg into the skin every 6 (six) months.   Yes [provider]  furosemide (LASIX) 20 MG tablet Take 20 mg daily, add an additional 20 mg of days of weight gain over 120 lb or leg swelling Patient taking differently: Take 20 mg by mouth See admin instructions. Take 20 mg by mouth once a day and an additional 20 mg on days of weight gain over 120 lbs or leg swelling 07/20/21  Yes Warren Lacy, PA-C  levothyroxine (SYNTHROID, LEVOTHROID) 150 MCG tablet Take 150 mcg by mouth daily before breakfast. 12/06/14  Yes [provider]  metFORMIN (GLUCOPHAGE) 500 MG tablet Take 1,000 mg by mouth 2 (two) times daily.   Yes [provider]  OXYGEN Inhale 4 L/min into the lungs continuous.   Yes [provider]  sacubitril-valsartan (ENTRESTO) 24-26 MG Take 1 tablet by mouth 2 (two) times  daily. 07/13/21  Yes [name], MD  solifenacin (VESICARE) 10 MG tablet Take 10 mg by mouth at bedtime.   Yes [provider]  VOLTAREN 1 % GEL Apply 2 g topically 2 (two) times daily as needed (for bilateral knee pain). 09/02/13  Yes [provider]  docusate sodium (COLACE) 100 MG capsule Take 100 mg by mouth at bedtime as needed for mild constipation. Patient not taking: Reported on 01/26/2022    [provider]  magnesium gluconate (MAGONATE) 500 MG tablet Take 500 mg by mouth every evening. Patient not taking: Reported on 01/13/2022    [provider]  potassium chloride (KLOR-CON M) 10 MEQ tablet Take 10 mEq by mouth daily. Patient not taking: Reported on 01/18/2022    [provider]  protein supplement shake (PREMIER PROTEIN) LIQD Take 237 mLs by mouth in the morning. Patient not taking: Reported on 01/31/2022    [provider]  potassium chloride (K-DUR) 10 MEQ tablet Take 0.5 tablets (5 mEq total) by mouth daily. 09/28/14 02/25/15  Croitoru, Dani Gobble, MD     Critical care time: 39 minutes     Georgann Housekeeper, AGACNP-BC [facility]  See Amion for personal pager PCCM on call pager [phone] until 7pm. Please call Elink 7p-7a. [phone]  01/17/2022 12:20 AM

## 2022-01-17 ENCOUNTER — Inpatient Hospital Stay (HOSPITAL_COMMUNITY): Payer: Medicare HMO

## 2022-01-17 DIAGNOSIS — R570 Cardiogenic shock: Secondary | ICD-10-CM

## 2022-01-17 DIAGNOSIS — N179 Acute kidney failure, unspecified: Secondary | ICD-10-CM | POA: Diagnosis not present

## 2022-01-17 DIAGNOSIS — J90 Pleural effusion, not elsewhere classified: Secondary | ICD-10-CM

## 2022-01-17 DIAGNOSIS — I5031 Acute diastolic (congestive) heart failure: Secondary | ICD-10-CM | POA: Diagnosis not present

## 2022-01-17 DIAGNOSIS — E8729 Other acidosis: Secondary | ICD-10-CM

## 2022-01-17 DIAGNOSIS — R001 Bradycardia, unspecified: Secondary | ICD-10-CM | POA: Diagnosis not present

## 2022-01-17 LAB — BASIC METABOLIC PANEL
Anion gap: 16 — ABNORMAL HIGH (ref 5–15)
Anion gap: 14 (ref 5–15)
Anion gap: 15 (ref 5–15)
BUN: 82 mg/dL — ABNORMAL HIGH (ref 8–23)
BUN: 81 mg/dL — ABNORMAL HIGH (ref 8–23)
BUN: 80 mg/dL — ABNORMAL HIGH (ref 8–23)
CO2: 18 mmol/L — ABNORMAL LOW (ref 22–32)
CO2: 18 mmol/L — ABNORMAL LOW (ref 22–32)
CO2: 19 mmol/L — ABNORMAL LOW (ref 22–32)
Calcium: 10.7 mg/dL — ABNORMAL HIGH (ref 8.9–10.3)
Calcium: 10.5 mg/dL — ABNORMAL HIGH (ref 8.9–10.3)
Calcium: 10.1 mg/dL (ref 8.9–10.3)
Chloride: 101 mmol/L (ref 98–111)
Chloride: 102 mmol/L (ref 98–111)
Chloride: 101 mmol/L (ref 98–111)
Creatinine, Ser: 2.14 mg/dL — ABNORMAL HIGH (ref 0.44–1.00)
Creatinine, Ser: 2.1 mg/dL — ABNORMAL HIGH (ref 0.44–1.00)
Creatinine, Ser: 1.99 mg/dL — ABNORMAL HIGH (ref 0.44–1.00)
GFR, Estimated: 21 mL/min — ABNORMAL LOW (ref >60)
GFR, Estimated: 21 mL/min — ABNORMAL LOW (ref >60)
GFR, Estimated: 23 mL/min — ABNORMAL LOW (ref >60)
Glucose, Bld: 224 mg/dL — ABNORMAL HIGH (ref 70–99)
Glucose, Bld: 248 mg/dL — ABNORMAL HIGH (ref 70–99)
Glucose, Bld: 275 mg/dL — ABNORMAL HIGH (ref 70–99)
Potassium: 6.2 mmol/L — ABNORMAL HIGH (ref 3.5–5.1)
Potassium: 5.6 mmol/L — ABNORMAL HIGH (ref 3.5–5.1)
Potassium: 5.4 mmol/L — ABNORMAL HIGH (ref 3.5–5.1)
Sodium: 135 mmol/L (ref 135–145)
Sodium: 134 mmol/L — ABNORMAL LOW (ref 135–145)
Sodium: 135 mmol/L (ref 135–145)

## 2022-01-17 LAB — T4, FREE: Free T4: 1.22 ng/dL — ABNORMAL HIGH (ref 0.61–1.12)

## 2022-01-17 LAB — ECHOCARDIOGRAM COMPLETE
Area-P 1/2: 2.01 cm2
S' Lateral: 2.3 cm
Weight: 2292.78 oz

## 2022-01-17 LAB — CBC
HCT: 49 % — ABNORMAL HIGH (ref 36.0–46.0)
Hemoglobin: 16.5 g/dL — ABNORMAL HIGH (ref 12.0–15.0)
MCH: 35.3 pg — ABNORMAL HIGH (ref 26.0–34.0)
MCHC: 33.7 g/dL (ref 30.0–36.0)
MCV: 104.7 fL — ABNORMAL HIGH (ref 80.0–100.0)
Platelets: 200 10*3/uL (ref 150–400)
RBC: 4.68 MIL/uL (ref 3.87–5.11)
RDW: 14.3 % (ref 11.5–15.5)
WBC: 11.3 10*3/uL — ABNORMAL HIGH (ref 4.0–10.5)
nRBC: 0 % (ref 0.0–0.2)

## 2022-01-17 LAB — CBG MONITORING, ED
Glucose-Capillary: 209 mg/dL — ABNORMAL HIGH (ref 70–99)
Glucose-Capillary: 261 mg/dL — ABNORMAL HIGH (ref 70–99)

## 2022-01-17 LAB — GLUCOSE, CAPILLARY
Glucose-Capillary: 164 mg/dL — ABNORMAL HIGH (ref 70–99)
Glucose-Capillary: 183 mg/dL — ABNORMAL HIGH (ref 70–99)
Glucose-Capillary: 219 mg/dL — ABNORMAL HIGH (ref 70–99)
Glucose-Capillary: 222 mg/dL — ABNORMAL HIGH (ref 70–99)
Glucose-Capillary: 222 mg/dL — ABNORMAL HIGH (ref 70–99)
Glucose-Capillary: 223 mg/dL — ABNORMAL HIGH (ref 70–99)
Glucose-Capillary: 182 mg/dL — ABNORMAL HIGH (ref 70–99)

## 2022-01-17 LAB — PROTIME-INR
INR: 1.2 (ref 0.8–1.2)
Prothrombin Time: 15.1 seconds (ref 11.4–15.2)

## 2022-01-17 LAB — POTASSIUM
Potassium: 6 mmol/L — ABNORMAL HIGH (ref 3.5–5.1)
Potassium: 6 mmol/L — ABNORMAL HIGH (ref 3.5–5.1)
Potassium: 5 mmol/L (ref 3.5–5.1)

## 2022-01-17 LAB — MRSA NEXT GEN BY PCR, NASAL: MRSA by PCR Next Gen: NOT DETECTED

## 2022-01-17 MED ORDER — LEVOTHYROXINE SODIUM 100 MCG/5ML IV SOLN
100.0000 ug | Freq: Every day | INTRAVENOUS | Status: AC
  Administered 2022-01-17 – 2022-01-19 (×3): 100 ug via INTRAVENOUS
  Filled 2022-01-17 (×3): qty 5

## 2022-01-17 MED ORDER — ALBUTEROL SULFATE (2.5 MG/3ML) 0.083% IN NEBU: 10.0000 mg | INHALATION_SOLUTION | Freq: Once | RESPIRATORY_TRACT | Status: DC

## 2022-01-17 MED ORDER — FUROSEMIDE 10 MG/ML IJ SOLN
60.0000 mg | Freq: Once | INTRAMUSCULAR | Status: AC
  Administered 2022-01-17: 60 mg via INTRAVENOUS
  Filled 2022-01-17: qty 6

## 2022-01-17 MED ORDER — SODIUM ZIRCONIUM CYCLOSILICATE 10 G PO PACK
10.0000 g | PACK | Freq: Once | ORAL | Status: AC
  Administered 2022-01-17: 10 g via ORAL
  Filled 2022-01-17: qty 1

## 2022-01-17 MED ORDER — INSULIN ASPART 100 UNIT/ML IV SOLN
10.0000 [IU] | Freq: Once | INTRAVENOUS | Status: AC
  Administered 2022-01-17: 10 [IU] via INTRAVENOUS

## 2022-01-17 MED ORDER — DEXTROSE 50 % IV SOLN
1.0000 | Freq: Once | INTRAVENOUS | Status: AC
  Administered 2022-01-17: 50 mL via INTRAVENOUS
  Filled 2022-01-17: qty 50

## 2022-01-17 MED ORDER — INSULIN ASPART 100 UNIT/ML IV SOLN
5.0000 [IU] | Freq: Once | INTRAVENOUS | Status: AC
  Administered 2022-01-17: 5 [IU] via INTRAVENOUS

## 2022-01-17 MED ORDER — CHLORHEXIDINE GLUCONATE CLOTH 2 % EX PADS
6.0000 | MEDICATED_PAD | Freq: Every day | CUTANEOUS | Status: DC
  Administered 2022-01-17 – 2022-01-21 (×5): 6 via TOPICAL

## 2022-01-17 MED ORDER — HYDROCORTISONE SOD SUC (PF) 100 MG IJ SOLR
100.0000 mg | Freq: Two times a day (BID) | INTRAMUSCULAR | Status: DC
  Administered 2022-01-17 – 2022-01-18 (×4): 100 mg via INTRAVENOUS
  Filled 2022-01-17 (×5): qty 2

## 2022-01-17 MED ORDER — SODIUM BICARBONATE 8.4 % IV SOLN
50.0000 meq | Freq: Once | INTRAVENOUS | Status: AC
  Administered 2022-01-17: 50 meq via INTRAVENOUS
  Filled 2022-01-17: qty 50

## 2022-01-17 MED ORDER — GERHARDT'S BUTT CREAM
TOPICAL_CREAM | Freq: Two times a day (BID) | CUTANEOUS | Status: DC
  Filled 2022-01-17: qty 1

## 2022-01-17 NOTE — Progress Notes (Signed)
eLink Physician-Brief Progress Note Patient Name: Marissa Jefferson DOB: 05-19-1924 MRN: [id number]   Date of Service  01/17/2022  HPI/Events of Note  PATIENT ADMITTED WITH SYMPTOMATIC BRADYCARDIA, HYPOTENSION, CONGESTIVE HEART FAILURE, AND ACUTE KIDNEY INJURY IN THE CONTEXT OF HYPOTHYROIDISM.  eICU Interventions  NEW PATIENT EVALUATION.        [name] [name] 01/17/2022, 2:37 AM

## 2022-01-17 NOTE — Progress Notes (Signed)
eLink Physician-Brief Progress Note Patient Name: Marissa Jefferson DOB: 1925-02-08 MRN: [id number]   Date of Service  01/17/2022  HPI/Events of Note  K+ 6.2, Cr 2.14, patient has received one round of treatment for hypokalemia which brought the K+ down from 6.8 to 6.2.  eICU Interventions  Hyperkalemia treatment protocol re-ordered.        [name] Demar Shad 01/17/2022, 3:36 AM

## 2022-01-17 NOTE — Progress Notes (Signed)
[facility] Progress Note Patient Name: Marissa Jefferson DOB: [birth year] MRN: [id number]   Date of Service  01/17/2022  HPI/Events of Note  K+ down to 5.6 after latest round of hyperkalemia protocol treatment.  eICU Interventions  Lokelma 10 gm via NG tube x 1.         [name] Marissa Jefferson 01/17/2022, 6:15 AM

## 2022-01-17 NOTE — Progress Notes (Signed)
NAME:  Marissa Jefferson, MRN:  [id number], DOB:  Jun 27, 1924, LOS: 1 ADMISSION DATE:  01/11/2022, CONSULTATION DATE:  11/7 REFERRING MD:  Dr Dellia Cloud , CHIEF COMPLAINT:  Hyperkalemia   History of Present Illness:  88 year old female with PMH as below, which is significant for HFrEF (LVEF 20% in 2015 now recovered post CABG x 4 to 55%), DM, HTN, Hypothyroid, and now has diastolic CHF.  Her daughter tells me she has several admissions in the past for congestive heart failure during which she has required IV diuresis.  Most recently she was discharged on home oxygen and wears 4 L continuously.  She presented to [name] ED 11/7 with complaints of bradycardia and hypotension.  She lives at home with assistance from a home health aide who came to check on her on the day of presentation and found oxygen saturation to be 75%.  EMS was called and upon their arrival she was also noted to be bradycardic into the 20s and hypotensive with systolic blood pressures in the 80s.  EMS treated with 1 mg of atropine and heart rate improved into the 50s with subsequent blood pressure improvement.  EKG in the ED was questionable for junctional escape versus slow atrial fibrillation.  Cardiology was consulted and admitted the patient to ICU with dopamine infusion and EP consultation.  Chemistries then resulted and showed potassium 6.8.  PCCM was consulted for hyperkalemia.  Pertinent  Medical History   has a past medical history of Acute CHF (congestive heart failure) ([place]) (06/17/2020), Arthritis, Breast cancer ([place]), CAD (coronary artery disease), native coronary artery (10/06/2013), CHF (congestive heart failure) ([place]) (06/17/2020), Chronic combined systolic and diastolic CHF, NYHA class 2 ([place]) (09/2013), CVA (cerebral vascular accident) ([place]) (1968), DM2 (diabetes mellitus, type 2) ([place]), GERD (gastroesophageal reflux disease), Hyperlipidemia, Hypertension, Hypothyroidism, Ischemic dilated cardiomyopathy ([place]) (09/2013), NSVT  (nonsustained ventricular tachycardia) ([place]) (09/2013), OAB (overactive bladder), Osteopenia, and 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   Significant Hospital Events: Including procedures, antibiotic start and stop dates in addition to other pertinent events   11/7 admit with bradycardia, hyperkalemia 11/8 HR improved with dopamine, K 5.6  Interim History / Subjective:  Pt awake, on Dopamone 69mg HR 60-70 150cc UOP  Objective   Blood pressure 112/78, pulse 66, temperature (!) 96.2 F (35.7 C), temperature source Axillary, resp. rate 13, weight 65 kg, SpO2 93 %.       No intake or output data in the 24 hours ending 01/17/22 0823 Filed Weights   01/10/2022 2015 01/17/22 0430  Weight: 54.4 kg 65 kg    General:  thin elderly F, ill-appearing but in no acute distress HEENT: MM pink/moist, sclera anicteric Neuro: pupils equal, awake and following commands, oriented to place, disoriented to year CV: s1s2 rrr, no m/r/g PULM:  scattered crackles, no distress on [place] GI: soft, non-distended Extremities: warm/dry, no edema  Skin: no rashes or lesions    Labs: K 6.0 Glu 183 Na 134 WBC 11.3 T4 1.2  Resolved Hospital Problem list     Assessment & Plan:   Symptomatic Bradycardia:  Improved with atropine. Secondary to hyperkalemia. Hypothyroidism may be playing a role as well. TSH 32. T4 1.2 -cardiology and EP managing, plan to wean dopamine to maintain HR >60 and treat reversible causes, no plans for PPM currently -monitor  -hold coreg -echo pending    Cardiogenic shock  secondary to bradycardia.  - Dopamine infusion continue per cardiology, echo -continue Lasix  -  Treat  hyperkalemia - Admit to ICU for close monitoring.   AKI: increased lasix dosing recently due to weight gain vs ARB related vs secondary to bradycardia/hypotension. Baseline creatinine 0.8 Hyperkalemia - trend BMP, K remains 6 today -additional dose  Lokelma, insulin 10 units IV and D50 - closely monitor, if not improving may need nephrology consult  Hypothyroid: history of Hashimoto's. Takes 150 mcg levothyroxine daily. TSH 32.  - free T4 1.2 - Will give one time dose of levothyroxine now and continue home dose.  -started on Solucortef  Acute on chronic HFpEF - Diuresis per cardiology  Chronic hypoxemic respiratory failure - continue home O2 4 L - O2 sat goal 90-98%  HTN HLD - Holding home amlodipine, entresto, lasix, coreg, resume per cardiology - Continue asa  DM2 - CBG monitoring and SSI   Best Practice (right click and "Reselect all SmartList Selections" daily)   Diet/type: Regular consistency (see orders) DVT prophylaxis: prophylactic heparin  GI prophylaxis: [name] Lines: [name] Foley:  [name] Code Status:  full code Last date of multidisciplinary goals of care discussion [ pending, will try to reach daughter]  Labs   CBC: Recent Labs  Lab 01/30/2022 1958 01/28/2022 2133 01/17/22 0507  WBC 10.9* 11.1* 11.3*  NEUTROABS 9.6*  --   --   HGB 16.5* 16.0* 16.5*  HCT 48.9* 49.0* 49.0*  MCV 107.2* 107.5* 104.7*  PLT 179 119* 200     Basic Metabolic Panel: Recent Labs  Lab 01/26/2022 1958 01/10/2022 2133 01/17/22 0216 01/17/22 0507  NA 135  --  135 134*  K 6.8*  --  6.2* 5.6*  CL 99  --  101 102  CO2 17*  --  18* 18*  GLUCOSE 232*  --  224* 248*  BUN 81*  --  82* 81*  CREATININE 2.11* 2.10* 2.14* 2.10*  CALCIUM 10.9*  --  10.7* 10.5*  MG 2.0  --   --   --     GFR: CrCl cannot be calculated (Unknown ideal weight.). Recent Labs  Lab 01/26/2022 1958 01/31/2022 2133 01/17/22 0507  WBC 10.9* 11.1* 11.3*     Liver Function Tests: Recent Labs  Lab 02/05/2022 1958  AST 42*  ALT 57*  ALKPHOS 51  BILITOT 0.7  PROT 6.0*  ALBUMIN 3.7    No results for input(s): "LIPASE", "AMYLASE" in the last 168 hours. No results for input(s): "AMMONIA" in the last 168 hours.  ABG    Component Value Date/Time   PHART  7.442 10/10/2013 0904   PCO2ART 35.2 10/10/2013 0904   PO2ART 115.0 (H) 10/10/2013 0904   HCO3 24.0 10/10/2013 0904   TCO2 22 10/10/2013 1642   ACIDBASEDEF 1.0 10/09/2013 2217   O2SAT 48.7 10/14/2013 1250     Coagulation Profile: Recent Labs  Lab 01/17/22 0216  INR 1.2    Cardiac Enzymes: No results for input(s): "CKTOTAL", "CKMB", "CKMBINDEX", "TROPONINI" in the last 168 hours.  HbA1C: Hgb A1c MFr Bld  Date/Time Value Ref Range Status  02/01/2022 09:33 PM 8.2 (H) 4.8 - 5.6 % Final    Comment:    (NOTE) Pre diabetes:          5.7%-6.4%  Diabetes:              >6.4%  Glycemic control for   <7.0% adults with diabetes   07/10/2021 04:21 AM 7.3 (H) 4.8 - 5.6 % Final    Comment:    (NOTE) Pre diabetes:          5.7%-6.4%  Diabetes:              >6.4%  Glycemic control for   <7.0% adults with diabetes     CBG: Recent Labs  Lab 01/17/22 0013 01/17/22 0103 01/17/22 0544 01/17/22 0644 01/17/22 0755  GLUCAP 209* 261* 164* 183* 219*     Review of Systems:   Please see the history of present illness. All other systems reviewed and are negative     Past Medical History:  She,  has a past medical history of Acute CHF (congestive heart failure) ([place]) (06/17/2020), Arthritis, Breast cancer ([place]), CAD (coronary artery disease), native coronary artery (10/06/2013), CHF (congestive heart failure) ([place]) (06/17/2020), Chronic combined systolic and diastolic CHF, NYHA class 2 ([place]) (09/2013), CVA (cerebral vascular accident) ([place]) (1968), DM2 (diabetes mellitus, type 2) ([place]), GERD (gastroesophageal reflux disease), Hyperlipidemia, Hypertension, Hypothyroidism, Ischemic dilated cardiomyopathy ([place]) (09/2013), NSVT (nonsustained ventricular tachycardia) ([place]) (09/2013), OAB (overactive bladder), Osteopenia, and 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   Surgical History:   Past Surgical History:  Procedure Laterality Date   BREAST  LUMPECTOMY Left 1998   BREAST LUMPECTOMY WITH AXILLARY LYMPH NODE DISSECTION Left 1998   CARDIAC CATHETERIZATION  10/06/2013   CATARACT EXTRACTION W/ INTRAOCULAR LENS  IMPLANT, BILATERAL Bilateral ~ 2009   COLONOSCOPY  2005   CORONARY ARTERY BYPASS GRAFT [name] 10/09/2013   Procedure: CORONARY ARTERY BYPASS GRAFTING (CABG) x 4 using left internal mammary artery and right saphenous leg vein using endoscope.;  Surgeon: [place] Nakayama, MD;  Location: [facility];  Service: Open Heart Surgery;  Laterality: [name];   INTRAOPERATIVE TRANSESOPHAGEAL ECHOCARDIOGRAM [name] 10/09/2013   Procedure: INTRAOPERATIVE TRANSESOPHAGEAL ECHOCARDIOGRAM;  Surgeon: [place] Nakayama, MD;  Location: [facility];  Service: Open Heart Surgery;  Laterality: [name];   LEFT AND RIGHT HEART CATHETERIZATION WITH CORONARY ANGIOGRAM [name] 10/06/2013   Procedure: LEFT AND RIGHT HEART CATHETERIZATION WITH CORONARY ANGIOGRAM;  Surgeon: Sanda Klein, MD;  Location: [facility] C[facility];  Service: Cardiovascular;  Laterality: [name];   TONSILLECTOMY AND ADENOIDECTOMY  1930's     Social History:   reports that she quit smoking about 73 years ago. Her smoking use included cigarettes. She has a 0.75 pack-year smoking history. She has never used smokeless tobacco. She reports that she does not drink alcohol and does not use drugs.   Family History:  Her family history includes Asthma in her daughter and son; Cancer in her father; Colon cancer in her mother; Heart attack in her father.   Allergies Allergies  Allergen Reactions   Amiodarone Other (See Comments)    Bradycardia   Rosuvastatin Nausea Only   Sitagliptin Other (See Comments)    Caused urinary frequency     Home Medications  Prior to Admission medications   Medication Sig Start Date End Date Taking? Authorizing Provider  acetaminophen (TYLENOL) 500 MG tablet Take 500-1,000 mg by mouth See admin instructions. Take 1,000 mg by mouth in the morning & at bedtime and an additional 500 mg once a day  as needed for pain/discomfort   Yes [provider]  amLODipine (NORVASC) 5 MG tablet Take 1 tablet (5 mg total) by mouth daily. 07/14/21  Yes [name], MD  aspirin EC 81 MG tablet Take 1 tablet (81 mg total) by mouth daily. 01/23/19  Yes Croitoru, Mihai, MD  atorvastatin (LIPITOR) 80 MG tablet Take 80 mg by mouth daily. 10/04/13  Yes [provider]  B Complex-Biotin-FA (SUPER B-50 COMPLEX PO) Take 1 tablet by mouth daily  before lunch.   Yes [provider]  Biotin 5000 MCG SUBL Place 5,000 mcg under the tongue daily.   Yes [provider]  carvedilol (COREG) 25 MG tablet TAKE 1 TABLET BY MOUTH TWICE A DAY WITH MEALS Patient taking differently: Take 25 mg by mouth 2 (two) times daily with a meal. 05/15/21  Yes Croitoru, Mihai, MD  Cholecalciferol (VITAMIN D3) 125 MCG (5000 UT) CAPS Take 5,000 Units by mouth daily before lunch.   Yes [provider]  Cyanocobalamin (VITAMIN B-12) 5000 MCG SUBL Place 5,000 mcg under the tongue daily before lunch.   Yes [provider]  denosumab (PROLIA) 60 MG/ML SOSY injection Inject 60 mg into the skin every 6 (six) months.   Yes [provider]  furosemide (LASIX) 20 MG tablet Take 20 mg daily, add an additional 20 mg of days of weight gain over 120 lb or leg swelling Patient taking differently: Take 20 mg by mouth See admin instructions. Take 20 mg by mouth once a day and an additional 20 mg on days of weight gain over 120 lbs or leg swelling 07/20/21  Yes Warren Lacy, PA-C  levothyroxine (SYNTHROID, LEVOTHROID) 150 MCG tablet Take 150 mcg by mouth daily before breakfast. 12/06/14  Yes [provider]  metFORMIN (GLUCOPHAGE) 500 MG tablet Take 1,000 mg by mouth 2 (two) times daily.   Yes [provider]  OXYGEN Inhale 4 L/min into the lungs continuous.   Yes [provider]  sacubitril-valsartan (ENTRESTO) 24-26 MG Take 1 tablet by mouth 2 (two) times daily. 07/13/21   Yes [name], MD  solifenacin (VESICARE) 10 MG tablet Take 10 mg by mouth at bedtime.   Yes [provider]  VOLTAREN 1 % GEL Apply 2 g topically 2 (two) times daily as needed (for bilateral knee pain). 09/02/13  Yes [provider]  docusate sodium (COLACE) 100 MG capsule Take 100 mg by mouth at bedtime as needed for mild constipation. Patient not taking: Reported on 01/31/2022    [provider]  magnesium gluconate (MAGONATE) 500 MG tablet Take 500 mg by mouth every evening. Patient not taking: Reported on 01/23/2022    [provider]  potassium chloride (KLOR-CON M) 10 MEQ tablet Take 10 mEq by mouth daily. Patient not taking: Reported on 01/15/2022    [provider]  protein supplement shake (PREMIER PROTEIN) LIQD Take 237 mLs by mouth in the morning. Patient not taking: Reported on 01/25/2022    [provider]  potassium chloride (K-DUR) 10 MEQ tablet Take 0.5 tablets (5 mEq total) by mouth daily. 09/28/14 02/25/15  Croitoru, Dani Gobble, MD     Critical care time: 88     CRITICAL CARE Performed by: Otilio Carpen Sheritha Louis   Total critical care time: 35 minutes  Critical care time was exclusive of separately billable procedures and treating other patients.  Critical care was necessary to treat or prevent imminent or life-threatening deterioration.  Critical care was time spent personally by me on the following activities: development of treatment plan with patient and/or surrogate as well as nursing, discussions with consultants, evaluation of patient's response to treatment, examination of patient, obtaining history from patient or surrogate, ordering and performing treatments and interventions, ordering and review of laboratory studies, ordering and review of radiographic studies, pulse oximetry and re-evaluation of patient's condition.  Otilio Carpen Eyden Dobie, PA-C [facility] See Amion for pager If no response to  pager , please call [phone]  until 7pm After 7:00 pm call Elink  [phone]?[place]

## 2022-01-17 NOTE — Consult Note (Addendum)
Cardiology Consultation   Patient ID: Marissa Jefferson MRN: [id number]; DOB: [birth year]  Admit date: 02/01/2022 Date of Consult: 01/17/2022  PCP:  Marissa Jefferson, [place] [facility] Cardiologist:  Marissa Klein, MD   {   Patient Profile:   Marissa Jefferson is a 88 y.o. female with a hx of CAD (CABG 2015), post-op AFib (nont known to have further Afib and not on a/c), HTN, HLD, DM, ICM that recovered post CABG, chronic CHF (diastolic),  who is being seen 01/17/2022 for the evaluation of symptomatic bradycardia at the request of Dr. Dellia Cloud.  History of Present Illness:   Marissa Jefferson was hospitalized in May with acute HF exacerbation, elevated Trops felt demand in setting of volume OL, maintained SR through her stay.  She was diuresed, known, noted to have severe p.HTN also felt 2/2 volume OL. Discharged to home. Home O2 4L  She was admitted last night, calling EMS with home  health aid finding her hypoxic in the 70's, with reports of a couple days of unusual fatigue, weakness, lightheaded, EMS found her bradycardic in the 20's by chart note treated with atropine and transported with improved HRs. ER was hypotensive and started on dopamine Cardiology consulted, presenting EKG perhaps slow Afib reached out to EP Admitted Home coreg ('25mg'$  BID), and home BP meds held Maintained on dopamine  LABS K+ 6.8 > 6.2 > 5.6 > 6.0 BUN/Creat 81/2.11 > 2.1 > 2.14 Mag 2.0 BNP 3526 > 2830 HS Trop 66, 61 WBC 10.9 > 11.1 > 11.3 H/H 16/49 Plts 179 > > 200 TSH 32.325 Free T4 1.22  She denies any c/o CP at home, no palpitations  Past Medical History:  Diagnosis Date   Acute CHF (congestive heart failure) ([place]) 06/17/2020   Arthritis    "knees" (10/08/2013)   Breast cancer ([place])    "left; I had 62 sessions of radiation"   CAD (coronary artery disease), native coronary artery 10/06/2013   LAD 90%, CFX 90%, RCA 100% w/ collat   CHF (congestive heart failure) ([place]) 06/17/2020    Chronic combined systolic and diastolic CHF, NYHA class 2 ([place]) 09/2013   CVA (cerebral vascular accident) ([place]) 1968   leaving no deficit   DM2 (diabetes mellitus, type 2) ([place])    GERD (gastroesophageal reflux disease)    Hyperlipidemia    Hypertension    Hypothyroidism    Ischemic dilated cardiomyopathy ([place]) 09/2013   EF 20-25% by echo   NSVT (nonsustained ventricular tachycardia) ([place]) 09/2013   OAB (overactive bladder)    Osteopenia    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) 05/10/2014    Past Surgical History:  Procedure Laterality Date   BREAST LUMPECTOMY Left 1998   BREAST LUMPECTOMY WITH AXILLARY LYMPH NODE DISSECTION Left 1998   CARDIAC CATHETERIZATION  10/06/2013   CATARACT EXTRACTION W/ INTRAOCULAR LENS  IMPLANT, BILATERAL Bilateral ~ 2009   COLONOSCOPY  2005   CORONARY ARTERY BYPASS GRAFT [name] 10/09/2013   Procedure: CORONARY ARTERY BYPASS GRAFTING (CABG) x 4 using left internal mammary artery and right saphenous leg vein using endoscope.;  Surgeon: Marissa Nakayama, MD;  Location: [facility];  Service: Open Heart Surgery;  Laterality: [name];   INTRAOPERATIVE TRANSESOPHAGEAL ECHOCARDIOGRAM [name] 10/09/2013   Procedure: INTRAOPERATIVE TRANSESOPHAGEAL ECHOCARDIOGRAM;  Surgeon: Marissa Nakayama, MD;  Location: [facility];  Service: Open Heart Surgery;  Laterality: [name];   LEFT AND RIGHT HEART CATHETERIZATION WITH CORONARY ANGIOGRAM [name] 10/06/2013   Procedure: LEFT AND RIGHT HEART  CATHETERIZATION WITH CORONARY ANGIOGRAM;  Surgeon: Marissa Klein, MD;  Location[facility] C[facility] LAB;  Service: Cardiovascular;  Laterality: [name];   TONSILLECTOMY AND ADENOIDECTOMY  1930's     Home Medications:  Prior to Admission medications   Medication Sig Start Date End Date Taking? Authorizing Provider  acetaminophen (TYLENOL) 500 MG tablet Take 500-1,000 mg by mouth See admin instructions. Take 1,000 mg by mouth in the morning & at bedtime and an additional 500 mg once a day  as needed for pain/discomfort   Yes [provider]  amLODipine (NORVASC) 5 MG tablet Take 1 tablet (5 mg total) by mouth daily. 07/14/21  Yes [name], MD  aspirin EC 81 MG tablet Take 1 tablet (81 mg total) by mouth daily. 01/23/19  Yes Jefferson, Mihai, MD  atorvastatin (LIPITOR) 80 MG tablet Take 80 mg by mouth daily. 10/04/13  Yes [provider]  B Complex-Biotin-FA (SUPER B-50 COMPLEX PO) Take 1 tablet by mouth daily before lunch.   Yes [provider]  Biotin 5000 MCG SUBL Place 5,000 mcg under the tongue daily.   Yes [provider]  carvedilol (COREG) 25 MG tablet TAKE 1 TABLET BY MOUTH TWICE A DAY WITH MEALS Patient taking differently: Take 25 mg by mouth 2 (two) times daily with a meal. 05/15/21  Yes Jefferson, Mihai, MD  Cholecalciferol (VITAMIN D3) 125 MCG (5000 UT) CAPS Take 5,000 Units by mouth daily before lunch.   Yes [provider]  Cyanocobalamin (VITAMIN B-12) 5000 MCG SUBL Place 5,000 mcg under the tongue daily before lunch.   Yes [provider]  denosumab (PROLIA) 60 MG/ML SOSY injection Inject 60 mg into the skin every 6 (six) months.   Yes [provider]  furosemide (LASIX) 20 MG tablet Take 20 mg daily, add an additional 20 mg of days of weight gain over 120 lb or leg swelling Patient taking differently: Take 20 mg by mouth See admin instructions. Take 20 mg by mouth once a day and an additional 20 mg on days of weight gain over 120 lbs or leg swelling 07/20/21  Yes Marissa Lacy, PA-C  levothyroxine (SYNTHROID, LEVOTHROID) 150 MCG tablet Take 150 mcg by mouth daily before breakfast. 12/06/14  Yes [provider]  metFORMIN (GLUCOPHAGE) 500 MG tablet Take 1,000 mg by mouth 2 (two) times daily.   Yes [provider]  OXYGEN Inhale 4 L/min into the lungs continuous.   Yes [provider]  sacubitril-valsartan (ENTRESTO) 24-26 MG Take 1 tablet by mouth 2 (two) times daily. 07/13/21   Yes [name], MD  solifenacin (VESICARE) 10 MG tablet Take 10 mg by mouth at bedtime.   Yes [provider]  VOLTAREN 1 % GEL Apply 2 g topically 2 (two) times daily as needed (for bilateral knee pain). 09/02/13  Yes [provider]  docusate sodium (COLACE) 100 MG capsule Take 100 mg by mouth at bedtime as needed for mild constipation. Patient not taking: Reported on 01/29/2022    [provider]  magnesium gluconate (MAGONATE) 500 MG tablet Take 500 mg by mouth every evening. Patient not taking: Reported on 02/08/2022    [provider]  potassium chloride (KLOR-CON M) 10 MEQ tablet Take 10 mEq by mouth daily. Patient not taking: Reported on 01/24/2022    [provider]  protein supplement shake (PREMIER PROTEIN) LIQD Take 237 mLs by mouth in the morning. Patient not taking: Reported on 01/24/2022    [provider]  potassium chloride (  K-DUR) 10 MEQ tablet Take 0.5 tablets (5 mEq total) by mouth daily. 09/28/14 02/25/15  Jefferson, Dani Gobble, MD    Inpatient Medications: Scheduled Meds:  albuterol  10 mg Nebulization Once   aspirin EC  81 mg Oral Daily   atorvastatin  80 mg Oral Daily   Chlorhexidine Gluconate Cloth  6 each Topical Daily   docusate sodium  100 mg Oral BID   heparin  5,000 Units Subcutaneous Q8H   hydrocortisone sod succinate (SOLU-CORTEF) inj  100 mg Intravenous Q12H   insulin aspart  0-5 Units Subcutaneous QHS   insulin aspart  0-9 Units Subcutaneous TID WC   levothyroxine  100 mcg Intravenous Daily   sodium zirconium cyclosilicate  10 g Oral Once   Continuous Infusions:  sodium chloride 10 mL/hr at 01/17/22 0140   DOPamine 5 mcg/kg/min (01/17/22 0140)   PRN Meds: acetaminophen  Allergies:    Allergies  Allergen Reactions   Amiodarone Other (See Comments)    Bradycardia   Rosuvastatin Nausea Only   Sitagliptin Other (See Comments)    Caused urinary frequency    Social History:   Social History    Socioeconomic History   Marital status: Widowed    Spouse name: Not on file   Number of children: 2   Years of education: Not on file   Highest education level: High school graduate  Occupational History   Occupation: [employer]  Tobacco Use   Smoking status: Former    Packs/day: 0.25    Years: 3.00    Total pack years: 0.75    Types: Cigarettes    Quit date: 03/12/1948    Years since quitting: 73.9   Smokeless tobacco: Never   Tobacco comments:    smoked in college   Vaping Use   Vaping Use: Never used  Substance and Sexual Activity   Alcohol use: No   Drug use: No   Sexual activity: Not Currently  Other Topics Concern   Not on file  Social History Narrative   Not on file   Social Determinants of Health   Financial Resource Strain: Not on file  Food Insecurity: No Food Insecurity (07/11/2021)   Hunger Vital Sign    Worried About Running Out of Food in the Last Year: Never true    [place] in the Last Year: Never true  Transportation Needs: No Transportation Needs (07/11/2021)   PRAPARE - Hydrologist (Medical): No    Lack of Transportation (Non-Medical): No  Physical Activity: Not on file  Stress: Not on file  Social Connections: Not on file  Intimate Partner Violence: Not on file    Family History:   Family History  Problem Relation Age of Onset   Colon cancer Mother    Cancer Father        ? type   Heart attack Father    Asthma Son    Asthma Daughter      ROS:  Please see the history of present illness.  All other ROS reviewed and negative.     Physical Exam/Data:   Vitals:   01/17/22 0700 01/17/22 0754 01/17/22 0800 01/17/22 0900  BP: 112/78  135/89 108/69  Pulse:      Resp: '13  16 13  '$ Temp:  (!) 96.2 F (35.7 C)    TempSrc:  Axillary    SpO2:      Weight:       No intake or output data in the 24  hours ending 01/17/22 0938    01/17/2022    4:30 AM 02/02/2022    8:15 PM 07/20/2021   11:29 AM  Last 3 Weights   Weight (lbs) 143 lb 4.8 oz 120 lb 122 lb 12.8 oz  Weight (kg) 65 kg 54.432 kg 55.702 kg     Body mass index is 23.13 kg/m.  General:  thin elderly female, in no acute distress, frail appearing HEENT: normal Neck: no JVD Vascular: No carotid bruits; Distal pulses 2+ bilaterally Cardiac:  RRR; no murmurs, gallops or rubs Lungs:  diminished at the bases, soft crackles, no wheezing, rhonchi or rales  Abd: soft, nontender, not distended  Ext: no edema Musculoskeletal:  No deformities, advanced atrophy Skin: warm and dry  Neuro: wakes easily, sleepy this AM, no gross focal motor abnormalities noted Psych:  pleasant and cooperative  EKG:  The EKG was personally reviewed and demonstrates:    #1 unclear underlying, is irregular, 54bpm, RBBB, LAD #2 similar, HR 53bpm, , no clear P waves, RBBB This AM, SR 75bpm, PACs, RBBB, 1st degree AVblock, 29m  OLD SR 71bpm, qst degree AVblock 2160m RBBB  Telemetry:  Telemetry was personally reviewed and demonstrates:   Junctional rhythm 50's with SR, rates mostly 60's- 70's intermittently with increased PVC frequency, couplets, rare NSVT 3-4 beats  Relevant CV Studies:   07/10/21: TTE  1. Left ventricular ejection fraction, by estimation, is 55 to 60%. The  left ventricle has normal function. The left ventricle has no regional  wall motion abnormalities. There is moderate concentric left ventricular  hypertrophy. Left ventricular  diastolic parameters are consistent with Grade II diastolic dysfunction  (pseudonormalization). There is the interventricular septum is flattened  in systole and diastole, consistent with right ventricular pressure and  volume overload.   2. Right ventricular systolic function is severely reduced. The right  ventricular size is mildly enlarged. Moderately increased right  ventricular wall thickness. There is severely elevated pulmonary artery  systolic pressure. The estimated right  ventricular systolic pressure  is 6332.9mHg.   3. Left atrial size was moderately dilated.   4. Right atrial size was moderately dilated.   5. The mitral valve is abnormal. Mild mitral valve regurgitation. No  evidence of mitral stenosis.   6. The aortic valve is tricuspid. Aortic valve regurgitation is not  visualized. Aortic valve sclerosis/calcification is present, without any  evidence of aortic stenosis.   7. The inferior vena cava is dilated in size with >50% respiratory  variability, suggesting right atrial pressure of 8 mmHg.   8. Cannot exclude a small PFO.   Comparison(s): No significant change from prior study.    Echo 06/18/20  1. Left ventricular ejection fraction, by estimation, is 55 to 60%. The  left ventricle has normal function. The left ventricle has no regional  wall motion abnormalities. There is moderate left ventricular hypertrophy.  Left ventricular diastolic  parameters are consistent with Grade II diastolic dysfunction  (pseudonormalization). Elevated left ventricular end-diastolic pressure.  There is the interventricular septum is flattened in diastole ('D' shaped  left ventricle), consistent with right  ventricular volume overload and the interventricular septum is flattened  in systole, consistent with right ventricular pressure overload.   2. Right ventricular systolic function is severely reduced. The right  ventricular size is mildly enlarged. There is severely elevated pulmonary  artery systolic pressure. The estimated right ventricular systolic  pressure is 6392.4mHg.   3. Left atrial size was mildly dilated.   4. Large  pleural effusion in the left lateral region.   5. The mitral valve is grossly normal. Mild mitral valve regurgitation.   6. Tricuspid valve regurgitation is moderate.   7. The NCC and the LCC are fused. . The aortic valve is tricuspid. Aortic  valve regurgitation is not visualized. Mild aortic valve stenosis.   8. The inferior vena cava is dilated in size with  <50% respiratory  variability, suggesting right atrial pressure of 15 mmHg.  Laboratory Data:  High Sensitivity Troponin:   Recent Labs  Lab 02/04/2022 1958 02/03/2022 2133  TROPONINIHS 66* 61*     Chemistry Recent Labs  Lab 01/15/2022 1958 01/31/2022 2133 01/17/22 0216 01/17/22 0507 01/17/22 0739  NA 135  --  135 134*  --   K 6.8*  --  6.2* 5.6* 6.0*  CL 99  --  101 102  --   CO2 17*  --  18* 18*  --   GLUCOSE 232*  --  224* 248*  --   BUN 81*  --  82* 81*  --   CREATININE 2.11* 2.10* 2.14* 2.10*  --   CALCIUM 10.9*  --  10.7* 10.5*  --   MG 2.0  --   --   --   --   GFRNONAA 21* 21* 21* 21*  --   ANIONGAP 19*  --  16* 14  --     Recent Labs  Lab 01/14/2022 1958  PROT 6.0*  ALBUMIN 3.7  AST 42*  ALT 57*  ALKPHOS 51  BILITOT 0.7   Lipids No results for input(s): "CHOL", "TRIG", "HDL", "LABVLDL", "LDLCALC", "CHOLHDL" in the last 168 hours.  Hematology Recent Labs  Lab 01/13/2022 1958 01/18/2022 2133 01/17/22 0507  WBC 10.9* 11.1* 11.3*  RBC 4.56 4.56 4.68  HGB 16.5* 16.0* 16.5*  HCT 48.9* 49.0* 49.0*  MCV 107.2* 107.5* 104.7*  MCH 36.2* 35.1* 35.3*  MCHC 33.7 32.7 33.7  RDW 14.3 14.4 14.3  PLT 179 119* 200   Thyroid  Recent Labs  Lab 02/08/2022 1958 01/17/22 0216  TSH 32.325*  --   FREET4  --  1.22*    BNP Recent Labs  Lab 01/28/2022 1958 01/17/2022 2133  BNP 3,526.7* 2,830.5*    DDimer No results for input(s): "DDIMER" in the last 168 hours.   Radiology/Studies:  DG Chest Portable 1 View  Result Date: 02/01/2022 CLINICAL DATA:  Bradycardia EXAM: PORTABLE CHEST 1 VIEW COMPARISON:  Chest 07/11/2021 FINDINGS: Postop CABG. Mild vascular congestion. Small bilateral pleural effusions. Possible mild interstitial edema Left lower lobe airspace disease most compatible with atelectasis. IMPRESSION: Mild vascular congestion possible mild fluid overload Bilateral pleural effusions and left lower lobe atelectasis. Electronically Signed   By: Franchot Gallo M.D.   On:  01/11/2022 19:51     Assessment and Plan:   Symptomatic bradycardia A number of reversible causes Coreg, hyperkalemia, and hypothyroid  Try to wean dopamine to maintain HR >60 (d/w RN bedside)  No plans for PPM at this juncture   Will defer management as per CCM and attending cardiology teams Hyperkalemia Hypothyroidism AKI CAD No c/o CP Chronic CHF (diastolic) Volume OL   Risk Assessment/Risk Scores:    For questions or updates, please contact [place] Please consult [email or web address] for contact info under    Signed, Baldwin [place], PA-C  01/17/2022 9:38 AM'

## 2022-01-17 NOTE — Progress Notes (Signed)
Pt's daughter updated today, confirms partial code status, no intubation or chest compressions, cardioversion and ACLS medications are ok.  Questions answered.  Otilio Carpen Brodin Gelpi, PA-C

## 2022-01-17 NOTE — Progress Notes (Signed)
  Echocardiogram 2D Echocardiogram has been performed.  Marissa Jefferson 01/17/2022, 12:47 PM

## 2022-01-17 NOTE — Progress Notes (Signed)
Rounding Note    Patient Name: Marissa Jefferson Date of Encounter: 01/17/2022  [place] Cardiologist: Sanda Klein, MD   Subjective   Not fully oriented but no acute distress. Remains on dopamine but weaning down with no transcutaneous pacing and normal intrinsic rhythm. She nods her head but slow to produce the date.  Inpatient Medications    Scheduled Meds:  albuterol  10 mg Nebulization Once   aspirin EC  81 mg Oral Daily   atorvastatin  80 mg Oral Daily   Chlorhexidine Gluconate Cloth  6 each Topical Daily   docusate sodium  100 mg Oral BID   heparin  5,000 Units Subcutaneous Q8H   insulin aspart  0-5 Units Subcutaneous QHS   insulin aspart  0-9 Units Subcutaneous TID WC   levothyroxine  100 mcg Intravenous Daily   sodium zirconium cyclosilicate  10 g Oral Once   Continuous Infusions:  sodium chloride 10 mL/hr at 01/17/22 0140   DOPamine 5 mcg/kg/min (01/17/22 0140)   PRN Meds: acetaminophen   Vital Signs    Vitals:   01/17/22 0500 01/17/22 0600 01/17/22 0700 01/17/22 0754  BP: 117/68 110/73 112/78   Pulse:      Resp: (!) '24 12 13   '$ Temp:    (!) 96.2 F (35.7 C)  TempSrc:    Axillary  SpO2:      Weight:       No intake or output data in the 24 hours ending 01/17/22 0835    01/17/2022    4:30 AM 01/20/2022    8:15 PM 07/20/2021   11:29 AM  Last 3 Weights  Weight (lbs) 143 lb 4.8 oz 120 lb 122 lb 12.8 oz  Weight (kg) 65 kg 54.432 kg 55.702 kg      Telemetry    SR with ectopy Personally Reviewed  ECG    SR RBBB PACs - Personally Reviewed  Physical Exam   GEN: No acute distress, somnolent.   Neck: JVP at 30 degrees is elevated to the angle of the jaw with prominent V wave and inspiratory variability. Cardiac: RRR, no murmurs, rubs, or gallops.  Respiratory: Clear anteriorly GI: Soft, nontender, non-distended  MS: No edema; No deformity. Neuro:  Nonfocal  Psych: Normal affect   Labs    High Sensitivity Troponin:   Recent Labs   Lab 01/27/2022 1958 01/25/2022 2133  TROPONINIHS 66* 61*     Chemistry Recent Labs  Lab 01/18/2022 1958 01/22/2022 2133 01/17/22 0216 01/17/22 0507 01/17/22 0739  NA 135  --  135 134*  --   K 6.8*  --  6.2* 5.6* 6.0*  CL 99  --  101 102  --   CO2 17*  --  18* 18*  --   GLUCOSE 232*  --  224* 248*  --   BUN 81*  --  82* 81*  --   CREATININE 2.11* 2.10* 2.14* 2.10*  --   CALCIUM 10.9*  --  10.7* 10.5*  --   MG 2.0  --   --   --   --   PROT 6.0*  --   --   --   --   ALBUMIN 3.7  --   --   --   --   AST 42*  --   --   --   --   ALT 57*  --   --   --   --   ALKPHOS 51  --   --   --   --  BILITOT 0.7  --   --   --   --   GFRNONAA 21* 21* 21* 21*  --   ANIONGAP 19*  --  16* 14  --     Lipids No results for input(s): "CHOL", "TRIG", "HDL", "LABVLDL", "LDLCALC", "CHOLHDL" in the last 168 hours.  Hematology Recent Labs  Lab 01/22/2022 1958 01/25/2022 2133 01/17/22 0507  WBC 10.9* 11.1* 11.3*  RBC 4.56 4.56 4.68  HGB 16.5* 16.0* 16.5*  HCT 48.9* 49.0* 49.0*  MCV 107.2* 107.5* 104.7*  MCH 36.2* 35.1* 35.3*  MCHC 33.7 32.7 33.7  RDW 14.3 14.4 14.3  PLT 179 119* 200   Thyroid  Recent Labs  Lab 01/11/2022 1958 01/17/22 0216  TSH 32.325*  --   FREET4  --  1.22*    BNP Recent Labs  Lab 01/25/2022 1958 02/06/2022 2133  BNP 3,526.7* 2,830.5*    DDimer No results for input(s): "DDIMER" in the last 168 hours.   Radiology    DG Chest Portable 1 View  Result Date: 01/11/2022 CLINICAL DATA:  Bradycardia EXAM: PORTABLE CHEST 1 VIEW COMPARISON:  Chest 07/11/2021 FINDINGS: Postop CABG. Mild vascular congestion. Small bilateral pleural effusions. Possible mild interstitial edema Left lower lobe airspace disease most compatible with atelectasis. IMPRESSION: Mild vascular congestion possible mild fluid overload Bilateral pleural effusions and left lower lobe atelectasis. Electronically Signed   By: Franchot Gallo M.D.   On: 02/03/2022 19:51    Cardiac Studies  Echo pending  Patient  Profile     88 y.o. female CAD status post CABG in 2014, HFpEF, postoperative atrial fibrillation without known recurrence, hypertension, diabetes, hypothyroidism on therapy on whom cardiology was asked to admit for symptomatic bradycardia with hypotension requiring atropine and dopamine infusion with adequate response.  Noted to have a TSH of 32 and per family report may not have been taking her levothyroxine for approximately 2 weeks by assessment of daily pill container.  Assessment & Plan    Principal Problem:   Symptomatic bradycardia  Patient currently is on low-dose dopamine which has been successfully weaned, intrinsic rhythm in the 60s, sinus rhythm with ectopy.  Electrophysiology is on board.  Reversible causes of hemodynamically significant bradycardia include significant hypothyroidism, Coreg as part of medical therapy, and electrolyte derangements with hyperkalemia, resolving.  Patient has not required external pacing overnight per report.  -Wean dopamine as able, still requiring 3 mcg/kg/min. -Hold Coreg -Appreciate the assistance of critical care medicine in the management of her hypothyroidism and COPD with hypoxia -Hyperkalemia treated with Lokelma. -Echocardiogram today -Continue diuresis, Lasix 60 mg IV has been ordered by critical care medicine. -AKI, may be secondary to hypotension while bradycardic vs cardiorenal, creatinine appears to have plateaued.  Chest x-ray shows mild vascular congestion and bilateral pleural effusions.  Appears warm and wet on exam.  If conduction system disease persist despite correction of reversible causes, electrophysiology may plan for permanent pacing, yet to be determined.  Discussed with bedside nursing, electrophysiology.      For questions or updates, please contact [place] Please consult [email or web address] for contact info under        Signed, Elouise Munroe, MD  01/17/2022, 8:35 AM    CRITICAL CARE Performed by:  Cherlynn Kaiser, MD   Total critical care time: 35 minutes   Critical care time was exclusive of separately billable procedures and treating other patients.   Critical care was necessary to treat or prevent imminent or life-threatening deterioration.   Critical care was time  spent personally by me (independent of APPs or residents) on the following activities: development of treatment plan with patient and/or surrogate as well as nursing, discussions with consultants, evaluation of patient's response to treatment, examination of patient, obtaining history from patient or surrogate, ordering and performing treatments and interventions, ordering and review of laboratory studies, ordering and review of radiographic studies, pulse oximetry and re-evaluation of patient's condition.

## 2022-01-18 ENCOUNTER — Inpatient Hospital Stay (HOSPITAL_COMMUNITY): Payer: Medicare HMO

## 2022-01-18 ENCOUNTER — Inpatient Hospital Stay: Payer: Self-pay

## 2022-01-18 ENCOUNTER — Ambulatory Visit: Payer: Medicare HMO | Admitting: Cardiovascular Disease

## 2022-01-18 DIAGNOSIS — J9621 Acute and chronic respiratory failure with hypoxia: Secondary | ICD-10-CM | POA: Diagnosis not present

## 2022-01-18 DIAGNOSIS — R001 Bradycardia, unspecified: Secondary | ICD-10-CM | POA: Diagnosis not present

## 2022-01-18 DIAGNOSIS — J90 Pleural effusion, not elsewhere classified: Secondary | ICD-10-CM | POA: Diagnosis not present

## 2022-01-18 DIAGNOSIS — R570 Cardiogenic shock: Secondary | ICD-10-CM | POA: Diagnosis not present

## 2022-01-18 LAB — GLUCOSE, CAPILLARY
Glucose-Capillary: 209 mg/dL — ABNORMAL HIGH (ref 70–99)
Glucose-Capillary: 230 mg/dL — ABNORMAL HIGH (ref 70–99)
Glucose-Capillary: 243 mg/dL — ABNORMAL HIGH (ref 70–99)
Glucose-Capillary: 249 mg/dL — ABNORMAL HIGH (ref 70–99)

## 2022-01-18 LAB — POCT I-STAT 7, (LYTES, BLD GAS, ICA,H+H)
Acid-base deficit: 2 mmol/L (ref 0.0–2.0)
Bicarbonate: 23.5 mmol/L (ref 20.0–28.0)
Calcium, Ion: 1.4 mmol/L (ref 1.15–1.40)
HCT: 51 % — ABNORMAL HIGH (ref 36.0–46.0)
Hemoglobin: 17.3 g/dL — ABNORMAL HIGH (ref 12.0–15.0)
O2 Saturation: 92 %
Patient temperature: 97.7
Potassium: 4.6 mmol/L (ref 3.5–5.1)
Sodium: 133 mmol/L — ABNORMAL LOW (ref 135–145)
TCO2: 25 mmol/L (ref 22–32)
pCO2 arterial: 39 mmHg (ref 32–48)
pH, Arterial: 7.386 (ref 7.35–7.45)
pO2, Arterial: 62 mmHg — ABNORMAL LOW (ref 83–108)

## 2022-01-18 LAB — BRAIN NATRIURETIC PEPTIDE: B Natriuretic Peptide: 2980 pg/mL — ABNORMAL HIGH (ref 0.0–100.0)

## 2022-01-18 LAB — CBC
HCT: 55.7 % — ABNORMAL HIGH (ref 36.0–46.0)
Hemoglobin: 18.9 g/dL — ABNORMAL HIGH (ref 12.0–15.0)
MCH: 35.7 pg — ABNORMAL HIGH (ref 26.0–34.0)
MCHC: 33.9 g/dL (ref 30.0–36.0)
MCV: 105.3 fL — ABNORMAL HIGH (ref 80.0–100.0)
Platelets: 199 10*3/uL (ref 150–400)
RBC: 5.29 MIL/uL — ABNORMAL HIGH (ref 3.87–5.11)
RDW: 14.1 % (ref 11.5–15.5)
WBC: 11.5 10*3/uL — ABNORMAL HIGH (ref 4.0–10.5)
nRBC: 0 % (ref 0.0–0.2)

## 2022-01-18 LAB — BASIC METABOLIC PANEL
Anion gap: 13 (ref 5–15)
BUN: 77 mg/dL — ABNORMAL HIGH (ref 8–23)
CO2: 23 mmol/L (ref 22–32)
Calcium: 10.2 mg/dL (ref 8.9–10.3)
Chloride: 96 mmol/L — ABNORMAL LOW (ref 98–111)
Creatinine, Ser: 1.92 mg/dL — ABNORMAL HIGH (ref 0.44–1.00)
GFR, Estimated: 24 mL/min — ABNORMAL LOW (ref >60)
Glucose, Bld: 223 mg/dL — ABNORMAL HIGH (ref 70–99)
Potassium: 4.7 mmol/L (ref 3.5–5.1)
Sodium: 132 mmol/L — ABNORMAL LOW (ref 135–145)

## 2022-01-18 LAB — COOXEMETRY PANEL
Carboxyhemoglobin: 0.9 % (ref 0.5–1.5)
Methemoglobin: 0.7 % (ref 0.0–1.5)
O2 Saturation: 52 %
Total hemoglobin: 17.4 g/dL — ABNORMAL HIGH (ref 12.0–16.0)

## 2022-01-18 MED ORDER — NOREPINEPHRINE 4 MG/250ML-% IV SOLN
0.0000 ug/min | INTRAVENOUS | Status: DC
  Administered 2022-01-18: 5 ug/min via INTRAVENOUS
  Administered 2022-01-18: 10 ug/min via INTRAVENOUS
  Administered 2022-01-19: 13 ug/min via INTRAVENOUS
  Administered 2022-01-19 (×2): 10 ug/min via INTRAVENOUS
  Administered 2022-01-19: 16 ug/min via INTRAVENOUS
  Administered 2022-01-20: 14 ug/min via INTRAVENOUS
  Administered 2022-01-20: 11 ug/min via INTRAVENOUS
  Filled 2022-01-18 (×8): qty 250

## 2022-01-18 MED ORDER — INSULIN ASPART 100 UNIT/ML IJ SOLN
0.0000 [IU] | Freq: Every day | INTRAMUSCULAR | Status: DC
  Administered 2022-01-18: 2 [IU] via SUBCUTANEOUS

## 2022-01-18 MED ORDER — SODIUM CHLORIDE 0.9% FLUSH
10.0000 mL | Freq: Two times a day (BID) | INTRAVENOUS | Status: DC
  Administered 2022-01-18 – 2022-01-21 (×5): 10 mL

## 2022-01-18 MED ORDER — INSULIN ASPART 100 UNIT/ML IJ SOLN
0.0000 [IU] | Freq: Three times a day (TID) | INTRAMUSCULAR | Status: DC
  Administered 2022-01-18 (×2): 5 [IU] via SUBCUTANEOUS

## 2022-01-18 MED ORDER — FUROSEMIDE 10 MG/ML IJ SOLN
40.0000 mg | Freq: Once | INTRAMUSCULAR | Status: AC
  Administered 2022-01-18: 40 mg via INTRAVENOUS
  Filled 2022-01-18: qty 4

## 2022-01-18 MED ORDER — SODIUM CHLORIDE 0.9% FLUSH: 10.0000 mL | INTRAVENOUS | Status: DC | PRN

## 2022-01-18 MED ORDER — PHENOL 1.4 % MT LIQD
1.0000 | OROMUCOSAL | Status: DC | PRN
  Administered 2022-01-19: 1 via OROMUCOSAL
  Filled 2022-01-18: qty 177

## 2022-01-18 MED ORDER — POLYETHYLENE GLYCOL 3350 17 G PO PACK
17.0000 g | PACK | Freq: Every day | ORAL | Status: DC | PRN
  Administered 2022-01-19 – 2022-01-20 (×2): 17 g via ORAL
  Filled 2022-01-18 (×2): qty 1

## 2022-01-18 MED ORDER — INSULIN GLARGINE-YFGN 100 UNIT/ML ~~LOC~~ SOLN
5.0000 [IU] | Freq: Every day | SUBCUTANEOUS | Status: DC
  Administered 2022-01-18: 5 [IU] via SUBCUTANEOUS
  Filled 2022-01-18 (×2): qty 0.05

## 2022-01-18 MED ORDER — LEVOTHYROXINE SODIUM 75 MCG PO TABS
150.0000 ug | ORAL_TABLET | Freq: Every day | ORAL | Status: DC
  Administered 2022-01-20 – 2022-01-21 (×2): 150 ug via ORAL
  Filled 2022-01-18 (×2): qty 2

## 2022-01-18 NOTE — Progress Notes (Addendum)
Electrophysiology Rounding Note  Patient Name: Marissa Jefferson Date of Encounter: 01/18/2022  Primary Cardiologist: Sanda Klein, MD Electrophysiologist: New   Subjective   [place]. Sitting upright in bed, eating this am. Pleasant.  Inpatient Medications    Scheduled Meds:  albuterol  10 mg Nebulization Once   aspirin EC  81 mg Oral Daily   atorvastatin  80 mg Oral Daily   Chlorhexidine Gluconate Cloth  6 each Topical Daily   docusate sodium  100 mg Oral BID   Gerhardt's butt cream   Topical BID   heparin  5,000 Units Subcutaneous Q8H   hydrocortisone sod succinate (SOLU-CORTEF) inj  100 mg Intravenous Q12H   insulin aspart  0-5 Units Subcutaneous QHS   insulin aspart  0-9 Units Subcutaneous TID WC   levothyroxine  100 mcg Intravenous Daily   Continuous Infusions:  sodium chloride Stopped (01/17/22 1351)   DOPamine 5 mcg/kg/min (01/18/22 0600)   PRN Meds: acetaminophen   Vital Signs    Vitals:   01/18/22 0300 01/18/22 0400 01/18/22 0500 01/18/22 0600  BP: (!) 102/58 108/76 93/76 109/72  Pulse: 66 76 62 66  Resp: '15 18 18 '$ (!) 31  Temp:  (!) 96.5 F (35.8 C)    TempSrc:  Oral    SpO2: (!) 88% (!) 76% (!) 89% (!) 88%  Weight:    63.8 kg  Height:        Intake/Output Summary (Last 24 hours) at 01/18/2022 0758 Last data filed at 01/18/2022 0600 Gross per 24 hour  Intake 765.67 ml  Output 1350 ml  Net -584.33 ml   Filed Weights   02/03/2022 2015 01/17/22 0430 01/18/22 0600  Weight: 54.4 kg 65 kg 63.8 kg    Physical Exam    GEN- The patient is elderly appearing, alert and oriented x 3 today.   Head- normocephalic, atraumatic Eyes-  Sclera clear, conjunctiva pink Ears- hearing intact Oropharynx- clear Neck- supple Lungs- Clear to ausculation bilaterally, normal work of breathing Heart-  Somewhat irregular  rate and rhythm, no murmurs, rubs or gallops GI- soft, NT, ND, + BS Extremities- no clubbing or cyanosis. No edema Skin- no rash or lesion Psych-  euthymic mood, full affect Neuro- strength and sensation are intact  Labs    CBC Recent Labs    01/27/2022 1958 01/20/2022 2133 01/17/22 0507 01/18/22 0408  WBC 10.9*   < > 11.3* 11.5*  NEUTROABS 9.6*  --   --   --   HGB 16.5*   < > 16.5* 18.9*  HCT 48.9*   < > 49.0* 55.7*  MCV 107.2*   < > 104.7* 105.3*  PLT 179   < > 200 199   < > = values in this interval not displayed.   Basic Metabolic Panel Recent Labs    01/31/2022 1958 01/17/2022 2133 01/17/22 1539 01/17/22 2118 01/18/22 0649  NA 135   < > 135  --  132*  K 6.8*   < > 5.4* 5.0 4.7  CL 99   < > 101  --  96*  CO2 17*   < > 19*  --  23  GLUCOSE 232*   < > 275*  --  223*  BUN 81*   < > 80*  --  77*  CREATININE 2.11*   < > 1.99*  --  1.92*  CALCIUM 10.9*   < > 10.1  --  10.2  MG 2.0  --   --   --   --    < > =  values in this interval not displayed.   Liver Function Tests Recent Labs    01/31/2022 1958  AST 42*  ALT 57*  ALKPHOS 51  BILITOT 0.7  PROT 6.0*  ALBUMIN 3.7   No results for input(s): "LIPASE", "AMYLASE" in the last 72 hours. Cardiac Enzymes No results for input(s): "CKTOTAL", "CKMB", "CKMBINDEX", "TROPONINI" in the last 72 hours.   Telemetry    NSR as well as junctional/accelerated junction rhythm 50-70s, PVCs including couplets and rare NSVT (personally reviewed)  Radiology    ECHOCARDIOGRAM COMPLETE  Result Date: 01/17/2022    ECHOCARDIOGRAM REPORT   Patient Name:   Marissa Jefferson Date of Exam: 01/17/2022 Medical Rec #:  [id number]  Height:       66.0 in Accession #:    [id number] Weight:       143.3 lb Date of Birth:  1924-06-27  BSA:          1.736 m Patient Age:    88 years   BP:           104/66 mmHg Patient Gender: F          HR:           61 bpm. Exam Location:  Inpatient Procedure: 2D Echo, Color Doppler, Cardiac Doppler and 3D Echo Indications:    CHF-Acute Diastolic M42.68  History:        Patient has prior history of Echocardiogram examinations, most                 recent 07/10/2021.  Cardiomyopathy and CHF, CAD and NSTEMI, Prior                 CABG, Pulmonary HTN, Arrythmias:Bradycardia, Atrial Fibrillation                 and PVC; Risk Factors:Hypertension, Dyslipidemia, Diabetes and                 Former Smoker.  Sonographer:    Greer Pickerel Referring Phys: [place] Comments: Image acquisition challenging due to respiratory motion, Image acquisition challenging due to patient body habitus and Image acquisition challenging due to COPD. IMPRESSIONS  1. Left ventricular ejection fraction, by estimation, is 50%. The left ventricle has mildly decreased function. The left ventricle has no regional wall motion abnormalities. There is moderate concentric left ventricular hypertrophy. Left ventricular diastolic parameters are consistent with Grade I diastolic dysfunction (impaired relaxation).  2. D-shaped interventricular septum suggesting RV pressure/volume overload. Right ventricular systolic function is severely reduced. The right ventricular size is severely enlarged. There is moderately elevated pulmonary artery systolic pressure. The estimated right ventricular systolic pressure is 34.1 mmHg.  3. Right atrial size was severely dilated.  4. Tricuspid valve regurgitation is mild to moderate.  5. The aortic valve is bicuspid with fused non and left coronary cusps. There does appear to be a small mobile vegetation on the aortic valve. There is moderate calcification of the aortic valve. Aortic valve regurgitation is not visualized. No doppler evidence for aortic stenosis was shown on this study but visually there does appear to be aortic stenosis present. Suggest TEE for better evaluation of aortic valve.  6. Pulmonic valve regurgitation is moderate.  7. The mitral valve is normal in structure. Trivial mitral valve regurgitation. No evidence of mitral stenosis.  8. The inferior vena cava is dilated in size with <50% respiratory variability, suggesting right atrial pressure  of 15 mmHg.  9. There is a  left pleural effusion. Moderate pericardial effusion primarily towards the apex with fibrinous material. No tamponade. 10. Aortic dilatation noted. There is mild dilatation of the ascending aorta, measuring 41 mm. FINDINGS  Left Ventricle: Left ventricular ejection fraction, by estimation, is 50%. The left ventricle has mildly decreased function. The left ventricle has no regional wall motion abnormalities. The left ventricular internal cavity size was small. There is moderate concentric left ventricular hypertrophy. Left ventricular diastolic parameters are consistent with Grade I diastolic dysfunction (impaired relaxation). Right Ventricle: D-shaped interventricular septum suggesting RV pressure/volume overload. The right ventricular size is severely enlarged. No increase in right ventricular wall thickness. Right ventricular systolic function is severely reduced. There is moderately elevated pulmonary artery systolic pressure. The tricuspid regurgitant velocity is 3.30 m/s, and with an assumed right atrial pressure of 15 mmHg, the estimated right ventricular systolic pressure is 16.1 mmHg. Left Atrium: Left atrial size was normal in size. Right Atrium: Right atrial size was severely dilated. Pericardium: There is a left pleural effusion. A moderately sized pericardial effusion is present. Mitral Valve: The mitral valve is normal in structure. Mild to moderate mitral annular calcification. Trivial mitral valve regurgitation. No evidence of mitral valve stenosis. Tricuspid Valve: The tricuspid valve is normal in structure. Tricuspid valve regurgitation is mild to moderate. Aortic Valve: Small mobile vegetation on aortic valve. The aortic valve is bicuspid. There is severe calcifcation of the aortic valve. Aortic valve regurgitation is not visualized. No aortic stenosis is present. Pulmonic Valve: The pulmonic valve was normal in structure. Pulmonic valve regurgitation is moderate. Aorta:  The aortic root is normal in size and structure and aortic dilatation noted. There is mild dilatation of the ascending aorta, measuring 41 mm. Venous: The inferior vena cava is dilated in size with less than 50% respiratory variability, suggesting right atrial pressure of 15 mmHg. IAS/Shunts: No atrial level shunt detected by color flow Doppler.  LEFT VENTRICLE PLAX 2D LVIDd:         3.40 cm   Diastology LVIDs:         2.30 cm   LV e' medial:    3.49 cm/s LV PW:         1.30 cm   LV E/e' medial:  15.3 LV IVS:        1.30 cm   LV e' lateral:   3.97 cm/s LVOT diam:     1.80 cm   LV E/e' lateral: 13.5 LV SV:         28 LV SV Index:   16 LVOT Area:     2.54 cm                           3D Volume EF:                          3D EF:        47 %                          LV EDV:       63 ml                          LV ESV:       33 ml  LV SV:        30 ml RIGHT VENTRICLE RV S prime:     6.89 cm/s TAPSE (M-mode): 0.8 cm LEFT ATRIUM             Index        RIGHT ATRIUM           Index LA diam:        2.90 cm 1.67 cm/m   RA Area:     23.60 cm LA Vol (A2C):   47.2 ml 27.19 ml/m  RA Volume:   88.60 ml  51.05 ml/m LA Vol (A4C):   28.6 ml 16.48 ml/m LA Biplane Vol: 37.0 ml 21.32 ml/m  AORTIC VALVE             PULMONIC VALVE LVOT Vmax:   57.50 cm/s  PR End Diast Vel: 10.50 msec LVOT Vmean:  36.700 cm/s LVOT VTI:    0.110 m  AORTA Ao Root diam: 3.30 cm Ao Asc diam:  4.10 cm MITRAL VALVE               TRICUSPID VALVE MV Area (PHT): 2.01 cm    TR Peak grad:   43.6 mmHg MV Decel Time: 377 msec    TR Vmax:        330.00 cm/s MV E velocity: 53.40 cm/s MV A velocity: 60.10 cm/s  SHUNTS MV E/A ratio:  0.89        Systemic VTI:  0.11 m                            Systemic Diam: 1.80 cm Dalton McleanMD Electronically signed by Franki Monte Signature Date/Time: 01/17/2022/4:21:50 PM    Final    DG Chest Portable 1 View  Result Date: 02/01/2022 CLINICAL DATA:  Bradycardia EXAM: PORTABLE CHEST 1 VIEW  COMPARISON:  Chest 07/11/2021 FINDINGS: Postop CABG. Mild vascular congestion. Small bilateral pleural effusions. Possible mild interstitial edema Left lower lobe airspace disease most compatible with atelectasis. IMPRESSION: Mild vascular congestion possible mild fluid overload Bilateral pleural effusions and left lower lobe atelectasis. Electronically Signed   By: Franchot Gallo M.D.   On: 02/05/2022 19:51    Patient Profile     Marissa Jefferson is a 88 y.o. female with a hx of CAD (CABG 2015), post-op AFib (nont known to have further Afib and not on a/c), HTN, HLD, DM, ICM that recovered post CABG, chronic CHF (diastolic),  who is being seen 01/17/2022 for the evaluation of symptomatic bradycardia at the request of Dr. Dellia Cloud.   Assessment & Plan    Symptomatic bradycardia No further significant bradycardia.  Continue to wean dopamine as tolerated. No plans for pacing at this juncture.   2. Code Status Daughter has confirmed code status.  Pt is no intubation or chest compressions.  Cardioversion/ACLS meds OK.   3. Hyperkalemia Down to 4.7 this am  4. ? PAF Her underlying on arrival is unclear, she had distant post op AF after her CABG.  CHA2DS2/VASc would be 7 if It is more clear AF is noted.     5. Hypothyroidism Per primary/CCM Free T4 1.22, TSH 32.325  For questions or updates, please contact [place] Please consult [email or web address] for contact info under Cardiology/STEMI.  Signed, Shirley Friar, PA-C  01/18/2022, 7:58 AM

## 2022-01-18 NOTE — Care Management (Signed)
  Transition of Care [facility]) Screening Note   Patient Details  Name: Marissa Jefferson Date of Birth: [birth year]   Transition of Care [facility]) CM/SW Contact:    Bethena Roys, RN Phone Number: 01/18/2022, 3:36 PM    Transition of Care Department [facility]) has reviewed the patient and no TOC needs have been identified at this time. We will continue to monitor patient advancement through interdisciplinary progression rounds. If new patient transition needs arise, please place a TOC consult.

## 2022-01-18 NOTE — Progress Notes (Signed)
Peripherally Inserted Central Catheter Placement  The IV Nurse has discussed with the patient and/or persons authorized to consent for the patient, the purpose of this procedure and the potential benefits and risks involved with this procedure.  The benefits include less needle sticks, lab draws from the catheter, and the patient may be discharged home with the catheter. Risks include, but not limited to, infection, bleeding, blood clot (thrombus formation), and puncture of an artery; nerve damage and irregular heartbeat and possibility to perform a PICC exchange if needed/ordered by physician.  Alternatives to this procedure were also discussed.  Bard Power PICC patient education guide, fact sheet on infection prevention and patient information card has been provided to patient /or left at bedside.    PICC Placement Documentation  PICC Double Lumen 01/18/22 Right Brachial 37 cm 1 cm (Active)  Indication for Insertion or Continuance of Line Vasoactive infusions 01/18/22 1527  Exposed Catheter (cm) 1 cm 01/18/22 1527  Site Assessment Clean, Dry, Intact 01/18/22 1527  Lumen #1 Status Flushed;Saline locked;Blood return noted 01/18/22 1527  Lumen #2 Status Flushed;Saline locked;Blood return noted 01/18/22 1527  Dressing Type Transparent;Securing device 01/18/22 1527  Dressing Status Antimicrobial disc in place;Clean, Dry, Intact 01/18/22 1527  Safety Lock Not Applicable 60/15/61 5379  Line Adjustment (NICU/IV Team Only) No 01/18/22 1527  Dressing Intervention New dressing;Other (Comment) 01/18/22 1527  Dressing Change Due 01/25/22 01/18/22 1527       Enos Fling 01/18/2022, 3:28 PM

## 2022-01-18 NOTE — Progress Notes (Addendum)
NAME:  Marissa Jefferson, MRN:  [id number], DOB:  1924-08-25, LOS: 2 ADMISSION DATE:  01/29/2022, CONSULTATION DATE:  11/7 REFERRING MD:  Dr Dellia Cloud , CHIEF COMPLAINT:  Hyperkalemia   History of Present Illness:  88 year old female with PMH as below, which is significant for HFrEF (LVEF 20% in 2015 now recovered post CABG x 4 to 55%), DM, HTN, Hypothyroid, and now has diastolic CHF.  Her daughter tells me she has several admissions in the past for congestive heart failure during which she has required IV diuresis.  Most recently she was discharged on home oxygen and wears 4 L continuously.  She presented to [name] ED 11/7 with complaints of bradycardia and hypotension.  She lives at home with assistance from a home health aide who came to check on her on the day of presentation and found oxygen saturation to be 75%.  EMS was called and upon their arrival she was also noted to be bradycardic into the 20s and hypotensive with systolic blood pressures in the 80s.  EMS treated with 1 mg of atropine and heart rate improved into the 50s with subsequent blood pressure improvement.  EKG in the ED was questionable for junctional escape versus slow atrial fibrillation.  Cardiology was consulted and admitted the patient to ICU with dopamine infusion and EP consultation.  Chemistries then resulted and showed potassium 6.8.  PCCM was consulted for hyperkalemia.  Pertinent  Medical History   has a past medical history of Acute CHF (congestive heart failure) ([place]) (06/17/2020), Arthritis, Breast cancer ([place]), CAD (coronary artery disease), native coronary artery (10/06/2013), CHF (congestive heart failure) ([place]) (06/17/2020), Chronic combined systolic and diastolic CHF, NYHA class 2 ([place]) (09/2013), CVA (cerebral vascular accident) ([place]) (1968), DM2 (diabetes mellitus, type 2) ([place]), GERD (gastroesophageal reflux disease), Hyperlipidemia, Hypertension, Hypothyroidism, Ischemic dilated cardiomyopathy ([place]) (09/2013), NSVT  (nonsustained ventricular tachycardia) ([place]) (09/2013), OAB (overactive bladder), Osteopenia, and 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   Significant Hospital Events: Including procedures, antibiotic start and stop dates in addition to other pertinent events   11/7 admit with bradycardia, hyperkalemia 11/8 HR improved with dopamine, K 5.6 11/9 still on dopamine HR stable   Interim History / Subjective:  Awake no distress but c/o sore throat   Objective   Blood pressure 91/63, pulse 67, temperature (Abnormal) 96.5 F (35.8 C), temperature source Oral, resp. rate (Abnormal) 28, height '5\' 6"'$  (1.676 m), weight 63.8 kg, SpO2 90 %.        Intake/Output Summary (Last 24 hours) at 01/18/2022 0950 Last data filed at 01/18/2022 0800 Gross per 24 hour  Intake 775.54 ml  Output 1350 ml  Net -574.46 ml   Filed Weights   01/24/2022 2015 01/17/22 0430 01/18/22 0600  Weight: 54.4 kg 65 kg 63.8 kg  General resting in bed. No distrees HENT NCAT no JVD  Pulm crackles bases. Now on 13 liters Card irreg irreg Abd soft Ext warm + LE edema  Neuro intact  Resolved Hospital Problem list   Symptomatic bradycardia 2/2 betablocker and hyperkalemia   Assessment & Plan:   Acute on chronic biventricular HF w/ cardiogenic shock 2/2  EF 50% w/ mildly reduced LVF, mod LVH, grade I diastolic dysfxn, Has Severe RV dysfxn w. Severely reduced RV -off coreg -not requiring pacer Plan Cont to wean Dopamine  cont tele  Keep euvolemic Place PICC  Get CVP, co-ox I think we need to wean off dopa; change to NE then decide on further inotropic  intervention pending co-ox  Lasix  AF Plan  No BB Tele  Prob need to consider AC  h/o htn and HLD Plan Cont tele Cont asa Holding norvasc, entresto, lasix and coreg.   Acute on chronic Chronic hypoxic respiratory failure 2/2 pulmonary edema looks like superimposed on some degree of ILD Cont supplemental oxygen   Plan Cont supplemental oxygen Get CVP  AKI: increased lasix dosing recently due to weight gain vs ARB related vs secondary to bradycardia/hypotension. Baseline creatinine 0.8 -seems to be stablalizing Plan Avoid shock  Renal dose meds  Serial chem   Hyperkalemia -better Plan Monitor   Hypothyroid: history of Hashimoto's. Takes 150 mcg levothyroxine daily. TSH 32.  - free T4 1.2 Plan Cont current levo dosing (got extra dose of levothyroxine)  Can dc solucortef once off pressors  DM2 w/ hyperglycemia Plan Ssi   Constipation Plan Add miralax  Best Practice (right click and "Reselect all SmartList Selections" daily)   Diet/type: Regular consistency (see orders) DVT prophylaxis: prophylactic heparin  GI prophylaxis: [name] Lines: [name] Foley:  [name] Code Status:  full code Last date of multidisciplinary goals of care discussion [ pending, will try to reach daughter]  Critical care time:32 min      CRITICAL CARE Performed by: Clementeen Graham

## 2022-01-18 NOTE — Progress Notes (Signed)
Rounding Note    Patient Name: Lori-[name] Lindfors Date of Encounter: 01/18/2022  [facility] Cardiologist: Sanda Klein, MD   Subjective   Much clearer today. No CP or SOB.   Inpatient Medications    Scheduled Meds:  albuterol  10 mg Nebulization Once   aspirin EC  81 mg Oral Daily   atorvastatin  80 mg Oral Daily   Chlorhexidine Gluconate Cloth  6 each Topical Daily   docusate sodium  100 mg Oral BID   Gerhardt's butt cream   Topical BID   heparin  5,000 Units Subcutaneous Q8H   hydrocortisone sod succinate (SOLU-CORTEF) inj  100 mg Intravenous Q12H   insulin aspart  0-5 Units Subcutaneous QHS   insulin aspart  0-9 Units Subcutaneous TID WC   levothyroxine  100 mcg Intravenous Daily   Continuous Infusions:  sodium chloride Stopped (01/17/22 1351)   DOPamine 5 mcg/kg/min (01/18/22 0800)   PRN Meds: acetaminophen   Vital Signs    Vitals:   01/18/22 0815 01/18/22 0830 01/18/22 0845 01/18/22 0917  BP:  (!) 87/62 (!) 98/44 91/63  Pulse: (!) 41   67  Resp: '15 15 18 '$ (!) 28  Temp:      TempSrc:      SpO2: (!) 81% (!) 88%  90%  Weight:      Height:        Intake/Output Summary (Last 24 hours) at 01/18/2022 0940 Last data filed at 01/18/2022 0800 Gross per 24 hour  Intake 775.54 ml  Output 1350 ml  Net -574.46 ml      01/18/2022    6:00 AM 01/17/2022    4:30 AM 01/20/2022    8:15 PM  Last 3 Weights  Weight (lbs) 140 lb 10.5 oz 143 lb 4.8 oz 120 lb  Weight (kg) 63.8 kg 65 kg 54.432 kg      Telemetry    SR with frequent ectopy Personally Reviewed  ECG    SR RBBB PACs - Personally Reviewed  Physical Exam   GEN: No acute distress, somnolent.   Neck: JVP at 30 degrees is elevated to the mid neck with prominent V wave and inspiratory variability. Cardiac: RRR, soft systolic murmur.  Respiratory: Clear anteriorly GI: Soft, nontender, non-distended  MS: No edema; No deformity. Neuro:  Nonfocal  Psych: Normal affect   Labs    High Sensitivity  Troponin:   Recent Labs  Lab 01/20/2022 1958 02/04/2022 2133  TROPONINIHS 66* 61*     Chemistry Recent Labs  Lab 01/11/2022 1958 02/06/2022 2133 01/17/22 0507 01/17/22 0739 01/17/22 1539 01/17/22 2118 01/18/22 0649 01/18/22 0915  NA 135   < > 134*  --  135  --  132* 133*  K 6.8*   < > 5.6*   < > 5.4* 5.0 4.7 4.6  CL 99   < > 102  --  101  --  96*  --   CO2 17*   < > 18*  --  19*  --  23  --   GLUCOSE 232*   < > 248*  --  275*  --  223*  --   BUN 81*   < > 81*  --  80*  --  77*  --   CREATININE 2.11*   < > 2.10*  --  1.99*  --  1.92*  --   CALCIUM 10.9*   < > 10.5*  --  10.1  --  10.2  --   MG 2.0  --   --   --   --   --   --   --  PROT 6.0*  --   --   --   --   --   --   --   ALBUMIN 3.7  --   --   --   --   --   --   --   AST 42*  --   --   --   --   --   --   --   ALT 57*  --   --   --   --   --   --   --   ALKPHOS 51  --   --   --   --   --   --   --   BILITOT 0.7  --   --   --   --   --   --   --   GFRNONAA 21*   < > 21*  --  23*  --  24*  --   ANIONGAP 19*   < > 14  --  15  --  13  --    < > = values in this interval not displayed.    Lipids No results for input(s): "CHOL", "TRIG", "HDL", "LABVLDL", "LDLCALC", "CHOLHDL" in the last 168 hours.  Hematology Recent Labs  Lab 01/12/2022 2133 01/17/22 0507 01/18/22 0408 01/18/22 0915  WBC 11.1* 11.3* 11.5*  --   RBC 4.56 4.68 5.29*  --   HGB 16.0* 16.5* 18.9* 17.3*  HCT 49.0* 49.0* 55.7* 51.0*  MCV 107.5* 104.7* 105.3*  --   MCH 35.1* 35.3* 35.7*  --   MCHC 32.7 33.7 33.9  --   RDW 14.4 14.3 14.1  --   PLT 119* 200 199  --    Thyroid  Recent Labs  Lab 01/26/2022 1958 01/17/22 0216  TSH 32.325*  --   FREET4  --  1.22*    BNP Recent Labs  Lab 01/11/2022 1958 01/19/2022 2133 01/18/22 0408  BNP 3,526.7* 7,616.0* 2,980.0*    DDimer No results for input(s): "DDIMER" in the last 168 hours.   Radiology    ECHOCARDIOGRAM COMPLETE  Result Date: 01/17/2022    ECHOCARDIOGRAM REPORT   Patient Name:   RAMA SORCI Date of  Exam: 01/17/2022 Medical Rec #:  [id number]  Height:       66.0 in Accession #:    [id number] Weight:       143.3 lb Date of Birth:  24-Oct-1924  BSA:          1.736 m Patient Age:    89 years   BP:           104/66 mmHg Patient Gender: F          HR:           61 bpm. Exam Location:  Inpatient Procedure: 2D Echo, Color Doppler, Cardiac Doppler and 3D Echo Indications:    CHF-Acute Diastolic K09.38  History:        Patient has prior history of Echocardiogram examinations, most                 recent 07/10/2021. Cardiomyopathy and CHF, CAD and NSTEMI, Prior                 CABG, Pulmonary HTN, Arrythmias:Bradycardia, Atrial Fibrillation                 and PVC; Risk Factors:Hypertension, Dyslipidemia, Diabetes and                 Former  Smoker.  Sonographer:    Greer Pickerel Referring Phys: [place] Comments: Image acquisition challenging due to respiratory motion, Image acquisition challenging due to patient body habitus and Image acquisition challenging due to COPD. IMPRESSIONS  1. Left ventricular ejection fraction, by estimation, is 50%. The left ventricle has mildly decreased function. The left ventricle has no regional wall motion abnormalities. There is moderate concentric left ventricular hypertrophy. Left ventricular diastolic parameters are consistent with Grade I diastolic dysfunction (impaired relaxation).  2. D-shaped interventricular septum suggesting RV pressure/volume overload. Right ventricular systolic function is severely reduced. The right ventricular size is severely enlarged. There is moderately elevated pulmonary artery systolic pressure. The estimated right ventricular systolic pressure is 34.1 mmHg.  3. Right atrial size was severely dilated.  4. Tricuspid valve regurgitation is mild to moderate.  5. The aortic valve is bicuspid with fused non and left coronary cusps. There does appear to be a small mobile vegetation on the aortic valve. There is moderate calcification of the  aortic valve. Aortic valve regurgitation is not visualized. No doppler evidence for aortic stenosis was shown on this study but visually there does appear to be aortic stenosis present. Suggest TEE for better evaluation of aortic valve.  6. Pulmonic valve regurgitation is moderate.  7. The mitral valve is normal in structure. Trivial mitral valve regurgitation. No evidence of mitral stenosis.  8. The inferior vena cava is dilated in size with <50% respiratory variability, suggesting right atrial pressure of 15 mmHg.  9. There is a left pleural effusion. Moderate pericardial effusion primarily towards the apex with fibrinous material. No tamponade. 10. Aortic dilatation noted. There is mild dilatation of the ascending aorta, measuring 41 mm. FINDINGS  Left Ventricle: Left ventricular ejection fraction, by estimation, is 50%. The left ventricle has mildly decreased function. The left ventricle has no regional wall motion abnormalities. The left ventricular internal cavity size was small. There is moderate concentric left ventricular hypertrophy. Left ventricular diastolic parameters are consistent with Grade I diastolic dysfunction (impaired relaxation). Right Ventricle: D-shaped interventricular septum suggesting RV pressure/volume overload. The right ventricular size is severely enlarged. No increase in right ventricular wall thickness. Right ventricular systolic function is severely reduced. There is moderately elevated pulmonary artery systolic pressure. The tricuspid regurgitant velocity is 3.30 m/s, and with an assumed right atrial pressure of 15 mmHg, the estimated right ventricular systolic pressure is 96.2 mmHg. Left Atrium: Left atrial size was normal in size. Right Atrium: Right atrial size was severely dilated. Pericardium: There is a left pleural effusion. A moderately sized pericardial effusion is present. Mitral Valve: The mitral valve is normal in structure. Mild to moderate mitral annular  calcification. Trivial mitral valve regurgitation. No evidence of mitral valve stenosis. Tricuspid Valve: The tricuspid valve is normal in structure. Tricuspid valve regurgitation is mild to moderate. Aortic Valve: Small mobile vegetation on aortic valve. The aortic valve is bicuspid. There is severe calcifcation of the aortic valve. Aortic valve regurgitation is not visualized. No aortic stenosis is present. Pulmonic Valve: The pulmonic valve was normal in structure. Pulmonic valve regurgitation is moderate. Aorta: The aortic root is normal in size and structure and aortic dilatation noted. There is mild dilatation of the ascending aorta, measuring 41 mm. Venous: The inferior vena cava is dilated in size with less than 50% respiratory variability, suggesting right atrial pressure of 15 mmHg. IAS/Shunts: No atrial level shunt detected by color flow Doppler.  LEFT VENTRICLE PLAX 2D LVIDd:  3.40 cm   Diastology LVIDs:         2.30 cm   LV e' medial:    3.49 cm/s LV PW:         1.30 cm   LV E/e' medial:  15.3 LV IVS:        1.30 cm   LV e' lateral:   3.97 cm/s LVOT diam:     1.80 cm   LV E/e' lateral: 13.5 LV SV:         28 LV SV Index:   16 LVOT Area:     2.54 cm                           3D Volume EF:                          3D EF:        47 %                          LV EDV:       63 ml                          LV ESV:       33 ml                          LV SV:        30 ml RIGHT VENTRICLE RV S prime:     6.89 cm/s TAPSE (M-mode): 0.8 cm LEFT ATRIUM             Index        RIGHT ATRIUM           Index LA diam:        2.90 cm 1.67 cm/m   RA Area:     23.60 cm LA Vol (A2C):   47.2 ml 27.19 ml/m  RA Volume:   88.60 ml  51.05 ml/m LA Vol (A4C):   28.6 ml 16.48 ml/m LA Biplane Vol: 37.0 ml 21.32 ml/m  AORTIC VALVE             PULMONIC VALVE LVOT Vmax:   57.50 cm/s  PR End Diast Vel: 10.50 msec LVOT Vmean:  36.700 cm/s LVOT VTI:    0.110 m  AORTA Ao Root diam: 3.30 cm Ao Asc diam:  4.10 cm MITRAL VALVE                TRICUSPID VALVE MV Area (PHT): 2.01 cm    TR Peak grad:   43.6 mmHg MV Decel Time: 377 msec    TR Vmax:        330.00 cm/s MV E velocity: 53.40 cm/s MV A velocity: 60.10 cm/s  SHUNTS MV E/A ratio:  0.89        Systemic VTI:  0.11 m                            Systemic Diam: 1.80 cm Dalton McleanMD Electronically signed by Franki Monte Signature Date/Time: 01/17/2022/4:21:50 PM    Final    DG Chest Portable 1 View  Result Date: 01/25/2022 CLINICAL DATA:  Bradycardia EXAM: PORTABLE CHEST 1 VIEW COMPARISON:  Chest 07/11/2021 FINDINGS: Postop CABG. Mild vascular congestion. Small bilateral pleural  effusions. Possible mild interstitial edema Left lower lobe airspace disease most compatible with atelectasis. IMPRESSION: Mild vascular congestion possible mild fluid overload Bilateral pleural effusions and left lower lobe atelectasis. Electronically Signed   By: Franchot Gallo M.D.   On: 01/14/2022 19:51    Cardiac Studies   Patient Profile     88 y.o. female CAD status post CABG in 2014, HFpEF, postoperative atrial fibrillation without known recurrence, hypertension, diabetes, hypothyroidism on therapy on whom cardiology was asked to admit for symptomatic bradycardia with hypotension requiring atropine and dopamine infusion with adequate response.  Noted to have a TSH of 32 and per family report may not have been taking her levothyroxine for approximately 2 weeks by assessment of daily pill container.  Assessment & Plan    Principal Problem:   Symptomatic bradycardia  Patient currently is on low-dose dopamine which has been successfully weaned, intrinsic rhythm in the 60s, sinus rhythm with ectopy.  Electrophysiology is on board.  Reversible causes of hemodynamically significant bradycardia include significant hypothyroidism, Coreg as part of medical therapy, and electrolyte derangements with hyperkalemia, resolving.  Patient has not required external pacing overnight per report.  -Wean  dopamine as able. D/w CCM - will start vasopressin and place picc to obtain CVP. Significant degree of RHF and PHTN may be driving continued hypotension.  -Hold Coreg -Appreciate the assistance of critical care medicine in the management of her hypothyroidism and COPD with hypoxia -Hyperkalemia treated with Lokelma. Resolved.  -Echocardiogram noted borderline EF, pulmonary HTN, and possible AV vegetation and some degree of AS though not seen on echo. Will check blood cultures to start.  - requiring 13 L o2 so not a candidate for TEE and sedation currently. -Continue diuresis, per CCM lasix 40 mg IV daily. -AKI, may be secondary to hypotension while bradycardic vs cardiorenal, creatinine appears to have plateaued, slightly improved.      For questions or updates, please contact [place] Please consult [email or web address] for contact info under        Signed, Elouise Munroe, MD  01/18/2022, 9:40 AM    CRITICAL CARE Performed by: Cherlynn Kaiser, MD   Total critical care time: 30 minutes   Critical care time was exclusive of separately billable procedures and treating other patients.   Critical care was necessary to treat or prevent imminent or life-threatening deterioration.   Critical care was time spent personally by me (independent of APPs or residents) on the following activities: development of treatment plan with patient and/or surrogate as well as nursing, discussions with consultants, evaluation of patient's response to treatment, examination of patient, obtaining history from patient or surrogate, ordering and performing treatments and interventions, ordering and review of laboratory studies, ordering and review of radiographic studies, pulse oximetry and re-evaluation of patient's condition.

## 2022-01-19 DIAGNOSIS — J9621 Acute and chronic respiratory failure with hypoxia: Secondary | ICD-10-CM | POA: Diagnosis not present

## 2022-01-19 DIAGNOSIS — R570 Cardiogenic shock: Secondary | ICD-10-CM | POA: Diagnosis not present

## 2022-01-19 DIAGNOSIS — R001 Bradycardia, unspecified: Secondary | ICD-10-CM | POA: Diagnosis not present

## 2022-01-19 DIAGNOSIS — J90 Pleural effusion, not elsewhere classified: Secondary | ICD-10-CM | POA: Diagnosis not present

## 2022-01-19 DIAGNOSIS — Z515 Encounter for palliative care: Secondary | ICD-10-CM

## 2022-01-19 LAB — BASIC METABOLIC PANEL
Anion gap: 13 (ref 5–15)
Anion gap: 9 (ref 5–15)
BUN: 81 mg/dL — ABNORMAL HIGH (ref 8–23)
BUN: 84 mg/dL — ABNORMAL HIGH (ref 8–23)
CO2: 24 mmol/L (ref 22–32)
CO2: 25 mmol/L (ref 22–32)
Calcium: 10 mg/dL (ref 8.9–10.3)
Calcium: 9.6 mg/dL (ref 8.9–10.3)
Chloride: 94 mmol/L — ABNORMAL LOW (ref 98–111)
Chloride: 101 mmol/L (ref 98–111)
Creatinine, Ser: 1.73 mg/dL — ABNORMAL HIGH (ref 0.44–1.00)
Creatinine, Ser: 1.86 mg/dL — ABNORMAL HIGH (ref 0.44–1.00)
GFR, Estimated: 27 mL/min — ABNORMAL LOW (ref >60)
GFR, Estimated: 25 mL/min — ABNORMAL LOW (ref >60)
Glucose, Bld: 385 mg/dL — ABNORMAL HIGH (ref 70–99)
Glucose, Bld: 165 mg/dL — ABNORMAL HIGH (ref 70–99)
Potassium: 4.5 mmol/L (ref 3.5–5.1)
Potassium: 3.9 mmol/L (ref 3.5–5.1)
Sodium: 131 mmol/L — ABNORMAL LOW (ref 135–145)
Sodium: 135 mmol/L (ref 135–145)

## 2022-01-19 LAB — GLUCOSE, CAPILLARY
Glucose-Capillary: 262 mg/dL — ABNORMAL HIGH (ref 70–99)
Glucose-Capillary: 165 mg/dL — ABNORMAL HIGH (ref 70–99)
Glucose-Capillary: 486 mg/dL — ABNORMAL HIGH (ref 70–99)
Glucose-Capillary: 514 mg/dL (ref 70–99)
Glucose-Capillary: 451 mg/dL — ABNORMAL HIGH (ref 70–99)
Glucose-Capillary: >600 mg/dL (ref 70–99)
Glucose-Capillary: 293 mg/dL — ABNORMAL HIGH (ref 70–99)
Glucose-Capillary: 110 mg/dL — ABNORMAL HIGH (ref 70–99)
Glucose-Capillary: 136 mg/dL — ABNORMAL HIGH (ref 70–99)
Glucose-Capillary: 207 mg/dL — ABNORMAL HIGH (ref 70–99)
Glucose-Capillary: 135 mg/dL — ABNORMAL HIGH (ref 70–99)
Glucose-Capillary: 122 mg/dL — ABNORMAL HIGH (ref 70–99)
Glucose-Capillary: 130 mg/dL — ABNORMAL HIGH (ref 70–99)

## 2022-01-19 LAB — COOXEMETRY PANEL
Carboxyhemoglobin: 1.4 % (ref 0.5–1.5)
Carboxyhemoglobin: 0.8 % (ref 0.5–1.5)
Methemoglobin: <0.7 % (ref 0.0–1.5)
Methemoglobin: 0.8 % (ref 0.0–1.5)
O2 Saturation: 53.7 %
O2 Saturation: 53.7 %
Total hemoglobin: 18 g/dL — ABNORMAL HIGH (ref 12.0–16.0)
Total hemoglobin: 16.6 g/dL — ABNORMAL HIGH (ref 12.0–16.0)

## 2022-01-19 LAB — LACTIC ACID, PLASMA
Lactic Acid, Venous: 1.3 mmol/L (ref 0.5–1.9)
Lactic Acid, Venous: 1.9 mmol/L (ref 0.5–1.9)
Lactic Acid, Venous: 1.2 mmol/L (ref 0.5–1.9)

## 2022-01-19 LAB — GLUCOSE, RANDOM
Glucose, Bld: 386 mg/dL — ABNORMAL HIGH (ref 70–99)
Glucose, Bld: 371 mg/dL — ABNORMAL HIGH (ref 70–99)

## 2022-01-19 MED ORDER — ENSURE ENLIVE PO LIQD: 1.0000 | ORAL | Status: DC

## 2022-01-19 MED ORDER — DEXTROSE 50 % IV SOLN: 0.0000 mL | INTRAVENOUS | Status: DC | PRN

## 2022-01-19 MED ORDER — VASOPRESSIN 20 UNITS/100 ML INFUSION FOR SHOCK
0.0000 [IU]/min | INTRAVENOUS | Status: DC
  Administered 2022-01-19: 0.03 [IU]/min via INTRAVENOUS
  Administered 2022-01-20: 0.02 [IU]/min via INTRAVENOUS
  Administered 2022-01-20: 0.03 [IU]/min via INTRAVENOUS
  Administered 2022-01-21: 0.02 [IU]/min via INTRAVENOUS
  Filled 2022-01-19 (×4): qty 100

## 2022-01-19 MED ORDER — INSULIN REGULAR(HUMAN) IN NACL 100-0.9 UT/100ML-% IV SOLN
INTRAVENOUS | Status: DC
  Administered 2022-01-19: 3 [IU]/h via INTRAVENOUS
  Filled 2022-01-19 (×2): qty 100

## 2022-01-19 MED ORDER — MIDODRINE HCL 5 MG PO TABS
5.0000 mg | ORAL_TABLET | Freq: Three times a day (TID) | ORAL | Status: DC
  Administered 2022-01-19 – 2022-01-20 (×5): 5 mg via ORAL
  Filled 2022-01-19 (×6): qty 1

## 2022-01-19 MED ORDER — MILRINONE LACTATE IN DEXTROSE 20-5 MG/100ML-% IV SOLN
0.2500 ug/kg/min | INTRAVENOUS | Status: DC
  Administered 2022-01-19: 0.25 ug/kg/min via INTRAVENOUS
  Filled 2022-01-19: qty 100

## 2022-01-19 MED ORDER — METOCLOPRAMIDE HCL 5 MG/ML IJ SOLN
5.0000 mg | Freq: Three times a day (TID) | INTRAMUSCULAR | Status: DC | PRN
  Administered 2022-01-19 – 2022-01-21 (×3): 5 mg via INTRAVENOUS
  Filled 2022-01-19 (×3): qty 2

## 2022-01-19 MED ORDER — ENSURE ENLIVE PO LIQD
1.0000 | Freq: Three times a day (TID) | ORAL | Status: DC
  Administered 2022-01-19 – 2022-01-20 (×4): 237 mL via ORAL

## 2022-01-19 MED ORDER — LACTATED RINGERS IV BOLUS: 500.0000 mL | Freq: Once | INTRAVENOUS | Status: DC

## 2022-01-19 MED ORDER — ONDANSETRON HCL 4 MG/2ML IJ SOLN
4.0000 mg | Freq: Four times a day (QID) | INTRAMUSCULAR | Status: DC | PRN
  Administered 2022-01-19 – 2022-01-20 (×3): 4 mg via INTRAVENOUS
  Filled 2022-01-19 (×3): qty 2

## 2022-01-19 NOTE — Consult Note (Signed)
Palliative Medicine Inpatient Consult Note  Consulting Provider: Dr. Lynetta Mare  Reason for consult:   [place] Palliative Medicine Consult  Reason for Consult? Advanced cor pulmonale, now in decompensated shock no realistic therapeutic options.  Likely needs hospice.   01/19/2022  HPI:  Per intake H&P --> 88 year old female with PMH as below, which is significant for HFrEF (LVEF 20% in 2015 now recovered post CABG x 4 to 55%), DM, HTN, Hypothyroid, and now has diastolic CHF. Palliative care has been asked to get involved to discuss goals of care in the setting of end stage heart failure.   Clinical Assessment/Goals of Care:  *Please note that this is a verbal dictation therefore any spelling or grammatical errors are due to the "[place]" system interpretation.  I have reviewed medical records including EPIC notes, labs and imaging, received report from bedside RN, assessed the patient who is sleeping in bed.    I met with patients daughter, Neila Gear this morning to further discuss diagnosis prognosis, GOC, EOL wishes, disposition and options.   I introduced Palliative Medicine as specialized medical care for people living with serious illness. It focuses on providing relief from the symptoms and stress of a serious illness. The goal is to improve quality of life for both the patient and the family.  Medical History Review and Understanding:  A review of Kelia's past medical history inclusive of diabetes, HTN, hypothyroidism, and heart failure was complete.   Social History:  Ryka is from [facility] originally. She was married though is a widow. She traveled and lived in various states as a result of her husbands job. She has a son and a daughter. She is a strong, independent woman. She is of the [place].   Functional and Nutritional State:  Prior to hospitalization, Saadia was living in an apartment and has assistance from private  aids. Per her daughter she was quite functional and able to mobilize with a walker. She had been able to perform bADLs. In terms of appetite that had diminished leading up to hospitalization.   Advance Directives:  A detailed discussion was had today regarding advanced directives.    Code Status:  Per patients daughter, [name] she had spoken to [employer] last night and made [facility] a full DNAR/DNI resuscitation status.   Discussion:  [name] vocalized how difficult of a time this is. 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 She also shares that about a year and a half ago Jamar was in poor shape and she was able to bounce back from that. When she did go home though she was on oxygen which was new. [name] shares that over the past few weeks there have been instances whereby the O2 concentrator at her home has gone out. This has required quick intervention and eventually the patients family and aids were able to get it working properly again. Julyssa has been less active than prior in the past few weeks and has had a worsening appetite. [name] shares that she is seeing now the events/circumstances leading to hospitalization.   We reviewed that Redina has significant disease burden in the setting of her heart failure. I gently shared that we are presently supporting her with norepinephrine and vasopressin. 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 We reviewed what it would look like if we slowly weaned her off of the supportive drugs and focused on comfort.   In terms of  short term goals, [name] shares that her hope is that Zohar's best friend can visit today as well as her caregiver. She shares that she wants [place] to enjoy a decent meal. We discussed how paralyzing her ongoing nausea has been in terms of appetite therefore I shared we'd make her Zofran scheduled.   At this time the plan will be to continue present measures to enable time for friends to visit. [name] does not want people  "saying goodbye" and would rather like to enable this to be a more regular visit. [name] shares that as much as the patient has stated acceptance of end of life and death she truly does not believe her mother means this and there may be a fear associated with it.   Discussed the importance of continued conversation with family and their  medical providers regarding overall plan of care and treatment options, ensuring decisions are within the context of the patients values and GOCs.  Decision Maker: Neila Gear (Daughter): [phone] (Mobile)   SUMMARY OF RECOMMENDATIONS   DNAR/DNI  Continue present measures allowing time for family and friends to visit  An open and honest conversation was held with patients daughter, [name] she is aware of the severity of Khamya's illness  Discussed what comfort care would entail inclusive of weaning off pressors and focusing on any troubling symptoms  Ongoing support  Code Status/Advance Care Planning: DNAR/DNI   Symptom Management:  Nausea: - Change zofram to ATC - Continue reglan PRN  Palliative Prophylaxis:  Aspiration, Bowel Regimen, Delirium Protocol, Frequent Pain Assessment, Oral Care, Palliative Wound Care, and Turn Reposition  Additional Recommendations (Limitations, Scope, Preferences): Continue present care  Psycho-social/Spiritual:  Desire for further Chaplaincy support: Yes Additional Recommendations: Reviewed of RV failure   Prognosis: Limited hours to days presently, being supported by norepinephrine and vasopressin  Discharge Planning: Unclear  Vitals:   01/19/22 1300 01/19/22 1315  BP: 90/76 (!) 98/56  Pulse: 72 62  Resp: 15 20  Temp:    SpO2: 94% 91%    Intake/Output Summary (Last 24 hours) at 01/19/2022 1450 Last data filed at 01/19/2022 1200 Gross per 24 hour  Intake 628.56 ml  Output 200 ml  Net 428.56 ml   Last Weight  Most recent update: 01/19/2022  5:06 AM    Weight  61.5 kg (135 lb 9.3 oz)             Gen:  Frail elderly caucasian F in NAD HEENT: moist mucous membranes CV: Regular rate and rhythm  PULM:  ON 12LPM HFNC ABD: soft/nontender  EXT: No edema  Neuro: Sleeping  PPS: 20%   This conversation/these recommendations were discussed with patient primary care team.  Billing based on MDM: High  Problems Addressed: One acute or chronic illness or injury that poses a threat to life or bodily function  Amount and/or Complexity of Data: Category 3:Discussion of management or test interpretation with external physician/other qualified health care professional/appropriate source (not separately reported)  Risks: Decision regarding hospitalization or escalation of hospital care and Decision not to resuscitate or to de-escalate care because of poor prognosis ______________________________________________________ [place] Team Team Cell Phone: [phone] Please utilize secure chat with additional questions, if there is no response within 30 minutes please call the above phone number  Palliative Medicine Team providers are available by phone from 7am to 7pm daily and can be reached through the team cell phone.  Should this patient require assistance outside of these hours, please call  the patient's attending physician.

## 2022-01-19 NOTE — Progress Notes (Signed)
Attempting inotropic support  Not tolerating well. Increased ectopy. More concerned that this is more end stage RV failure than we first expected. If this is indeed the case there is little we can do that is going to fix her and we need to consider more symptom based approach.  I spoke to her daughter [name] (over the phone)  and her Son (who was at bedside). We have agreed to scale back on inotropic support. I have also introduced to them that I believe Ms Quincy would be a candidate for home hospice given how advanced her disease is. They are open to this,  Plan Cont limited code for now this would include cardioversion only Dc milrinone Giving fluid challenge. I think we actually went a little over w/ diuresis  Cont to wean NE Increase midodrine  Palliative care consult.   Erick Colace ACNP-BC [place] Pager # [phone] OR # [phone] if no answer

## 2022-01-19 NOTE — Progress Notes (Signed)
Nutrition Follow-up  DOCUMENTATION CODES:   Non-severe (moderate) malnutrition in context of chronic illness  INTERVENTION:   Liberalize diet to REGULAR  Ensure Enlive po TID, each supplement provides 350 kcal and 20 grams of protein. Goal is for pt to consume 2 per day currently; ordered TID so pt can sip on them throughout the day  Pt may need increased bowel regimen if continues without BM; no BM in 5 days  NUTRITION DIAGNOSIS:   Moderate Malnutrition related to chronic illness as evidenced by mild fat depletion, mild muscle depletion, edema.  GOAL:   Patient will meet greater than or equal to 90% of their needs  MONITOR:   PO intake, Supplement acceptance, Labs, Weight trends, Skin  REASON FOR ASSESSMENT:   Consult Assessment of nutrition requirement/status, Poor PO  ASSESSMENT:   88 yo female admitted with hyperkalemia with symptomatic bradycardia, acute on chronic BV HF with cardiogenic shock. PMH includes CHF, CAD, CVA, HTN, DM  Remains on levophed at 10, off dopamine Pt reports she does not feel well; looks like she does not feel well also Noted Palliative Care consult  Pt currently has no diet order, not even NPO order. RN reached out to MD who ordered Heart Healthy diet. Recorded po intake has been mostly 20-25% of meals  Pt reports no appetite at all. Pt reports +nausea but no vomiting, diarrhea/constipation or early satiety. Discussed with RN who reports pt has no meds for nausea currently ordered; RN reached out to MD and orders obtained.   Pt reports no problems chewing or swallowing  CBGs poorly controlled, insulin drip starting today. CBG issues are clearly not related to po intake or carb intake as pt  has been eating minimally. Noted steroids ordered  Current wt 61.5 kg; down from 65 kg.  Labs: sodium 131 (L), CBGs 165-262, Creatinine 1.73, BUN 81 Meds: insulin drip, colace BID, miralax prn  NUTRITION - FOCUSED PHYSICAL EXAM:  Flowsheet Row Most  Recent Value  Orbital Region Mild depletion  Upper Arm Region No depletion  Thoracic and Lumbar Region No depletion  Buccal Region Mild depletion  Temple Region Mild depletion  Clavicle Bone Region Mild depletion  Clavicle and Acromion Bone Region Mild depletion  Scapular Bone Region Mild depletion  Dorsal Hand Moderate depletion  Patellar Region Unable to assess  Anterior Thigh Region Unable to assess  Posterior Calf Region Unable to assess  Edema (RD Assessment) Mild       Diet Order:   Diet Order             Diet regular Room service appropriate? Yes; Fluid consistency: Thin  Diet effective now                   EDUCATION NEEDS:   Not appropriate for education at this time  Skin:  Skin Assessment: Reviewed RN Assessment  Last BM:  11/5  Height:   Ht Readings from Last 1 Encounters:  01/17/22 '5\' 6"'$  (1.676 m)    Weight:   Wt Readings from Last 1 Encounters:  01/19/22 61.5 kg    BMI:  Body mass index is 21.88 kg/m.  Estimated Nutritional Needs:   Kcal:  1450-1550 kcals  Protein:  70-80  Fluid:  1.5 L   Kerman Passey MS, RDN, LDN, CNSC Registered Dietitian 3 Clinical Nutrition RD Pager and On-Call Pager Number Located in [place]

## 2022-01-19 NOTE — Progress Notes (Signed)
Rounding Note    Patient Name: Marissa Jefferson Date of Encounter: 01/19/2022  [place] Cardiologist: Sanda Klein, MD   Subjective   Hypotensive and confused. Feels she has a cold.  Inpatient Medications    Scheduled Meds:  albuterol  10 mg Nebulization Once   aspirin EC  81 mg Oral Daily   atorvastatin  80 mg Oral Daily   Chlorhexidine Gluconate Cloth  6 each Topical Daily   docusate sodium  100 mg Oral BID   feeding supplement  1 Bottle Oral Q24H   Gerhardt's butt cream   Topical BID   heparin  5,000 Units Subcutaneous Q8H   [START ON 01/20/2022] levothyroxine  150 mcg Oral Q0600   midodrine  5 mg Oral TID WC   sodium chloride flush  10-40 mL Intracatheter Q12H   Continuous Infusions:  sodium chloride Stopped (01/18/22 1943)   insulin 3 Units/hr (01/19/22 0857)   milrinone 0.25 mcg/kg/min (01/19/22 1116)   norepinephrine (LEVOPHED) Adult infusion 10 mcg/min (01/19/22 0627)   PRN Meds: acetaminophen, dextrose, phenol, polyethylene glycol, sodium chloride flush   Vital Signs    Vitals:   01/19/22 0745 01/19/22 0800 01/19/22 0900 01/19/22 0911  BP:  103/83 (!) 85/61 (!) 99/57  Pulse:  79 76 82  Resp:  '16 13 15  '$ Temp: (!) 97.5 F (36.4 C)     TempSrc: Axillary     SpO2:  93% 90% 90%  Weight:      Height:        Intake/Output Summary (Last 24 hours) at 01/19/2022 1119 Last data filed at 01/19/2022 0600 Gross per 24 hour  Intake 753.25 ml  Output 600 ml  Net 153.25 ml      01/19/2022    5:00 AM 01/18/2022    6:00 AM 01/17/2022    4:30 AM  Last 3 Weights  Weight (lbs) 135 lb 9.3 oz 140 lb 10.5 oz 143 lb 4.8 oz  Weight (kg) 61.5 kg 63.8 kg 65 kg      Telemetry    SR with frequent ectopy Personally Reviewed  ECG    SR RBBB PACs - Personally Reviewed  Physical Exam   GEN: No acute distress, somnolent.   Neck: JVP at 60 degrees is elevated to the mid neck with prominent V wave and inspiratory variability. Cardiac: iRRR, soft systolic  murmur.  Respiratory: Clear anteriorly GI: Soft, nontender, non-distended  MS: No edema; No deformity. Neuro:  Nonfocal  Psych: Normal affect   Labs    High Sensitivity Troponin:   Recent Labs  Lab 01/31/2022 1958 01/25/2022 2133  TROPONINIHS 66* 61*     Chemistry Recent Labs  Lab 02/07/2022 1958 01/15/2022 2133 01/17/22 1539 01/17/22 2118 01/18/22 0649 01/18/22 0915 01/19/22 0502  NA 135   < > 135  --  132* 133* 131*  K 6.8*   < > 5.4*   < > 4.7 4.6 4.5  CL 99   < > 101  --  96*  --  94*  CO2 17*   < > 19*  --  23  --  24  GLUCOSE 232*   < > 275*  --  223*  --  385*  BUN 81*   < > 80*  --  77*  --  81*  CREATININE 2.11*   < > 1.99*  --  1.92*  --  1.73*  CALCIUM 10.9*   < > 10.1  --  10.2  --  10.0  MG  2.0  --   --   --   --   --   --   PROT 6.0*  --   --   --   --   --   --   ALBUMIN 3.7  --   --   --   --   --   --   AST 42*  --   --   --   --   --   --   ALT 57*  --   --   --   --   --   --   ALKPHOS 51  --   --   --   --   --   --   BILITOT 0.7  --   --   --   --   --   --   GFRNONAA 21*   < > 23*  --  24*  --  27*  ANIONGAP 19*   < > 15  --  13  --  13   < > = values in this interval not displayed.    Lipids No results for input(s): "CHOL", "TRIG", "HDL", "LABVLDL", "LDLCALC", "CHOLHDL" in the last 168 hours.  Hematology Recent Labs  Lab 01/30/2022 2133 01/17/22 0507 01/18/22 0408 01/18/22 0915  WBC 11.1* 11.3* 11.5*  --   RBC 4.56 4.68 5.29*  --   HGB 16.0* 16.5* 18.9* 17.3*  HCT 49.0* 49.0* 55.7* 51.0*  MCV 107.5* 104.7* 105.3*  --   MCH 35.1* 35.3* 35.7*  --   MCHC 32.7 33.7 33.9  --   RDW 14.4 14.3 14.1  --   PLT 119* 200 199  --    Thyroid  Recent Labs  Lab 02/06/2022 1958 01/17/22 0216  TSH 32.325*  --   FREET4  --  1.22*    BNP Recent Labs  Lab 01/11/2022 1958 02/03/2022 2133 01/18/22 0408  BNP 3,526.7* 2,725.3* 2,980.0*    DDimer No results for input(s): "DDIMER" in the last 168 hours.   Radiology    DG Chest Port 1 View  Result Date:  01/18/2022 CLINICAL DATA:  Hypoxia. EXAM: PORTABLE CHEST 1 VIEW COMPARISON:  02/05/2022 FINDINGS: Previous median sternotomy and CABG procedure. Bilateral pulmonary artery enlargement compatible with PA hypertension. Small bilateral pleural effusions identified. Mild diffuse interstitial edema identified. Unchanged left lower persistent retrocardiac opacification in the left base compatible with atelectasis or airspace disease. New bandlike opacity within the left midlung may reflect fluid along the oblique fissure or discoid atelectasis in the left midlung. Stable cardiomediastinal contours. IMPRESSION: 1. Interstitial edema and pleural effusion concerning for congestive heart failure. Mild persistent left lower lobe atelectasis and or airspace disease. 2. New bandlike opacity within the left midlung may reflect fluid along the oblique fissure or discoid atelectasis in the left midlung. Electronically Signed   By: Kerby Moors M.D.   On: 01/18/2022 10:22   [place] EKG SITE RITE  Result Date: 01/18/2022 If Site Rite image not attached, placement could not be confirmed due to current cardiac rhythm.  ECHOCARDIOGRAM COMPLETE  Result Date: 01/17/2022    ECHOCARDIOGRAM REPORT   Patient Name:   Marissa Jefferson Date of Exam: 01/17/2022 Medical Rec #:  [id number]  Height:       66.0 in Accession #:    [id number] Weight:       143.3 lb Date of Birth:  19-Jun-1924  BSA:          1.736 m Patient Age:  88 years   BP:           104/66 mmHg Patient Gender: F          HR:           61 bpm. Exam Location:  Inpatient Procedure: 2D Echo, Color Doppler, Cardiac Doppler and 3D Echo Indications:    CHF-Acute Diastolic W46.65  History:        Patient has prior history of Echocardiogram examinations, most                 recent 07/10/2021. Cardiomyopathy and CHF, CAD and NSTEMI, Prior                 CABG, Pulmonary HTN, Arrythmias:Bradycardia, Atrial Fibrillation                 and PVC; Risk Factors:Hypertension, Dyslipidemia, Diabetes  and                 Former Smoker.  Sonographer:    Greer Pickerel Referring Phys: [place] Comments: Image acquisition challenging due to respiratory motion, Image acquisition challenging due to patient body habitus and Image acquisition challenging due to COPD. IMPRESSIONS  1. Left ventricular ejection fraction, by estimation, is 50%. The left ventricle has mildly decreased function. The left ventricle has no regional wall motion abnormalities. There is moderate concentric left ventricular hypertrophy. Left ventricular diastolic parameters are consistent with Grade I diastolic dysfunction (impaired relaxation).  2. D-shaped interventricular septum suggesting RV pressure/volume overload. Right ventricular systolic function is severely reduced. The right ventricular size is severely enlarged. There is moderately elevated pulmonary artery systolic pressure. The estimated right ventricular systolic pressure is 99.3 mmHg.  3. Right atrial size was severely dilated.  4. Tricuspid valve regurgitation is mild to moderate.  5. The aortic valve is bicuspid with fused non and left coronary cusps. There does appear to be a small mobile vegetation on the aortic valve. There is moderate calcification of the aortic valve. Aortic valve regurgitation is not visualized. No doppler evidence for aortic stenosis was shown on this study but visually there does appear to be aortic stenosis present. Suggest TEE for better evaluation of aortic valve.  6. Pulmonic valve regurgitation is moderate.  7. The mitral valve is normal in structure. Trivial mitral valve regurgitation. No evidence of mitral stenosis.  8. The inferior vena cava is dilated in size with <50% respiratory variability, suggesting right atrial pressure of 15 mmHg.  9. There is a left pleural effusion. Moderate pericardial effusion primarily towards the apex with fibrinous material. No tamponade. 10. Aortic dilatation noted. There is mild dilatation of  the ascending aorta, measuring 41 mm. FINDINGS  Left Ventricle: Left ventricular ejection fraction, by estimation, is 50%. The left ventricle has mildly decreased function. The left ventricle has no regional wall motion abnormalities. The left ventricular internal cavity size was small. There is moderate concentric left ventricular hypertrophy. Left ventricular diastolic parameters are consistent with Grade I diastolic dysfunction (impaired relaxation). Right Ventricle: D-shaped interventricular septum suggesting RV pressure/volume overload. The right ventricular size is severely enlarged. No increase in right ventricular wall thickness. Right ventricular systolic function is severely reduced. There is moderately elevated pulmonary artery systolic pressure. The tricuspid regurgitant velocity is 3.30 m/s, and with an assumed right atrial pressure of 15 mmHg, the estimated right ventricular systolic pressure is 57.0 mmHg. Left Atrium: Left atrial size was normal in size. Right Atrium: Right atrial size was severely dilated.  Pericardium: There is a left pleural effusion. A moderately sized pericardial effusion is present. Mitral Valve: The mitral valve is normal in structure. Mild to moderate mitral annular calcification. Trivial mitral valve regurgitation. No evidence of mitral valve stenosis. Tricuspid Valve: The tricuspid valve is normal in structure. Tricuspid valve regurgitation is mild to moderate. Aortic Valve: Small mobile vegetation on aortic valve. The aortic valve is bicuspid. There is severe calcifcation of the aortic valve. Aortic valve regurgitation is not visualized. No aortic stenosis is present. Pulmonic Valve: The pulmonic valve was normal in structure. Pulmonic valve regurgitation is moderate. Aorta: The aortic root is normal in size and structure and aortic dilatation noted. There is mild dilatation of the ascending aorta, measuring 41 mm. Venous: The inferior vena cava is dilated in size with less  than 50% respiratory variability, suggesting right atrial pressure of 15 mmHg. IAS/Shunts: No atrial level shunt detected by color flow Doppler.  LEFT VENTRICLE PLAX 2D LVIDd:         3.40 cm   Diastology LVIDs:         2.30 cm   LV e' medial:    3.49 cm/s LV PW:         1.30 cm   LV E/e' medial:  15.3 LV IVS:        1.30 cm   LV e' lateral:   3.97 cm/s LVOT diam:     1.80 cm   LV E/e' lateral: 13.5 LV SV:         28 LV SV Index:   16 LVOT Area:     2.54 cm                           3D Volume EF:                          3D EF:        47 %                          LV EDV:       63 ml                          LV ESV:       33 ml                          LV SV:        30 ml RIGHT VENTRICLE RV S prime:     6.89 cm/s TAPSE (M-mode): 0.8 cm LEFT ATRIUM             Index        RIGHT ATRIUM           Index LA diam:        2.90 cm 1.67 cm/m   RA Area:     23.60 cm LA Vol (A2C):   47.2 ml 27.19 ml/m  RA Volume:   88.60 ml  51.05 ml/m LA Vol (A4C):   28.6 ml 16.48 ml/m LA Biplane Vol: 37.0 ml 21.32 ml/m  AORTIC VALVE             PULMONIC VALVE LVOT Vmax:   57.50 cm/s  PR End Diast Vel: 10.50 msec LVOT Vmean:  36.700 cm/s LVOT VTI:    0.110 m  AORTA  Ao Root diam: 3.30 cm Ao Asc diam:  4.10 cm MITRAL VALVE               TRICUSPID VALVE MV Area (PHT): 2.01 cm    TR Peak grad:   43.6 mmHg MV Decel Time: 377 msec    TR Vmax:        330.00 cm/s MV E velocity: 53.40 cm/s MV A velocity: 60.10 cm/s  SHUNTS MV E/A ratio:  0.89        Systemic VTI:  0.11 m                            Systemic Diam: 1.80 cm Dalton McleanMD Electronically signed by Franki Monte Signature Date/Time: 01/17/2022/4:21:50 PM    Final     Cardiac Studies   Patient Profile     88 y.o. female CAD status post CABG in 2014, HFpEF, postoperative atrial fibrillation without known recurrence, hypertension, diabetes, hypothyroidism on therapy on whom cardiology was asked to admit for symptomatic bradycardia with hypotension requiring atropine and  dopamine infusion with adequate response.  Noted to have a TSH of 32 and per family report may not have been taking her levothyroxine for approximately 2 weeks by assessment of daily pill container.  Assessment & Plan    Principal Problem:   Symptomatic bradycardia  Patient currently is on low-dose dopamine which has been successfully weaned, intrinsic rhythm in the 60s, sinus rhythm with ectopy.  Electrophysiology is on board.  Reversible causes of hemodynamically significant bradycardia include significant hypothyroidism, Coreg as part of medical therapy, and electrolyte derangements with hyperkalemia, resolving.   She has been weaned off dopamine and now on norepinephrine with increasing requirements. Remains hypotensive, cold on exam.   - has been on norepi, not successfully improving clinical status. Will initiate milrinone for inotropic support. May need continued norepi support. Will likely need diuresis as well once BP better.  - dry weight may be closer to 120-123 lb - overall poor prognosis. PCCM will assist in [place] with family.  -Echocardiogram noted borderline EF, pulmonary HTN, and possible AV vegetation and some degree of AS though not seen on echo. Bcx no growth to date.   For questions or updates, please contact [place] Please consult [email or web address] for contact info under        Signed, Elouise Munroe, MD  01/19/2022, 11:19 AM    CRITICAL CARE Performed by: Cherlynn Kaiser, MD   Total critical care time: 40 minutes   Critical care time was exclusive of separately billable procedures and treating other patients.   Critical care was necessary to treat or prevent imminent or life-threatening deterioration.   Critical care was time spent personally by me (independent of APPs or residents) on the following activities: development of treatment plan with patient and/or surrogate as well as nursing, discussions with consultants, evaluation of patient's  response to treatment, examination of patient, obtaining history from patient or surrogate, ordering and performing treatments and interventions, ordering and review of laboratory studies, ordering and review of radiographic studies, pulse oximetry and re-evaluation of patient's condition.

## 2022-01-19 NOTE — Progress Notes (Signed)
eLink Physician-Brief Progress Note Patient Name: Marissa Jefferson DOB: 1925/01/28 MRN: [id number]   Date of Service  01/19/2022  HPI/Events of Note  Levo gtt at 20 mcg, patient now DNR, echo suggestive of hypervolemia and RV volume  / pressure overload, additional fluid loading not indicated.  eICU Interventions  Will start Vasopressin gtt at 0.03 mcg to facilitate weaning Levo to off, targeting a MAP > 55 mmHg.         Marissa Jefferson 01/19/2022, 10:13 PM

## 2022-01-19 NOTE — Progress Notes (Signed)
   Palliative Medicine Inpatient Follow Up Note   The Palliative Care team has acknowledged the consultation for [facility]. Upon meeting her she is noted to be very short of breath and unable to complete long sentences. Introduced myself and my role. Asked if it would be okay to call patients son and daughter which she consented to.   I called patients daughter, Neila Gear and son, Jamia Hoban. Unfortunately, neither of them answered therefore a HIPAA compliant VM was left.   Awaiting call back for completion of consult.   No Charge ______________________________________________________________________________________ [place] Team Team Cell Phone: [phone] Please utilize secure chat with additional questions, if there is no response within 30 minutes please call the above phone number  Palliative Medicine Team providers are available by phone from 7am to 7pm daily and can be reached through the team cell phone.  Should this patient require assistance outside of these hours, please call the patient's attending physician.

## 2022-01-19 NOTE — Progress Notes (Addendum)
NAME:  Marissa Jefferson, MRN:  [id number], DOB:  1924/03/19, LOS: 2 ADMISSION DATE:  02/02/2022, CONSULTATION DATE:  11/7 REFERRING MD:  Dr Dellia Cloud , CHIEF COMPLAINT:  Hyperkalemia   History of Present Illness:  88 year old female with PMH as below, which is significant for HFrEF (LVEF 20% in 2015 now recovered post CABG x 4 to 55%), DM, HTN, Hypothyroid, and now has diastolic CHF.  Her daughter tells me she has several admissions in the past for congestive heart failure during which she has required IV diuresis.  Most recently she was discharged on home oxygen and wears 4 L continuously.  She presented to [name] ED 11/7 with complaints of bradycardia and hypotension.  She lives at home with assistance from a home health aide who came to check on her on the day of presentation and found oxygen saturation to be 75%.  EMS was called and upon their arrival she was also noted to be bradycardic into the 20s and hypotensive with systolic blood pressures in the 80s.  EMS treated with 1 mg of atropine and heart rate improved into the 50s with subsequent blood pressure improvement.  EKG in the ED was questionable for junctional escape versus slow atrial fibrillation.  Cardiology was consulted and admitted the patient to ICU with dopamine infusion and EP consultation.  Chemistries then resulted and showed potassium 6.8.  PCCM was consulted for hyperkalemia.  Pertinent  Medical History   has a past medical history of Acute CHF (congestive heart failure) ([place]) (06/17/2020), Arthritis, Breast cancer ([place]), CAD (coronary artery disease), native coronary artery (10/06/2013), CHF (congestive heart failure) ([place]) (06/17/2020), Chronic combined systolic and diastolic CHF, NYHA class 2 ([place]) (09/2013), CVA (cerebral vascular accident) ([place]) (1968), DM2 (diabetes mellitus, type 2) ([place]), GERD (gastroesophageal reflux disease), Hyperlipidemia, Hypertension, Hypothyroidism, Ischemic dilated cardiomyopathy ([place]) (09/2013), NSVT  (nonsustained ventricular tachycardia) ([place]) (09/2013), OAB (overactive bladder), Osteopenia, and 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   Significant Hospital Events: Including procedures, antibiotic start and stop dates in addition to other pertinent events   11/7 admit with bradycardia, hyperkalemia 11/8 HR improved with dopamine, K 5.6 11/9 still on dopamine HR stable changed to NE 11/10 still on pressors. SCVO2 low. Advanced HF team asked to eval. Stopped systemic steroids. Started on insulin gtt   Interim History / Subjective:  No new complaints  Objective   Blood pressure 91/63, pulse 67, temperature (Abnormal) 96.5 F (35.8 C), temperature source Oral, resp. rate (Abnormal) 28, height '5\' 6"'$  (1.676 m), weight 63.8 kg, SpO2 90 %.        Intake/Output Summary (Last 24 hours) at 01/18/2022 0950 Last data filed at 01/18/2022 0800 Gross per 24 hour  Intake 775.54 ml  Output 1350 ml  Net -574.46 ml   Filed Weights   01/13/2022 2015 01/17/22 0430 01/18/22 0600  Weight: 54.4 kg 65 kg 63.8 kg   General frail 88 year old female. She is resting in bed. Denies CP or distress HENT NCAT no JVD Pulm crackles bases no accessory use  Card reg irreg Ext warm + pitting edema lower ext.  Abd soft  Neuro intact  Resolved Hospital Problem list   Symptomatic bradycardia 2/2 betablocker and hyperkalemia   Assessment & Plan:   Acute on chronic biventricular HF w/ cardiogenic shock 2/2  EF 50% w/ mildly reduced LVF, mod LVH, grade I diastolic dysfxn, Has Severe RV dysfxn w. Severely RV Plan CVP at goal  Cont tele Cont NE (MAP  goal > 65) Will ask AHF to see. I think we need to consider more inotrope.  Cont daily assessment for lasix  Add midodrine  AF Plan  Holding BB Will need to decide on [facility] however at 88 years old don't feel strongly about this   h/o htn and HLD Plan Cont tele Cont asa Holding norvasc, entresto and  coreg  Acute on chronic Chronic hypoxic respiratory failure 2/2 pulmonary edema looks like superimposed on some degree of ILD Cont supplemental oxygen  Plan Volume removal as tolerated Wean FIO2 IS OOB  AKI: increased lasix dosing recently due to weight gain vs ARB related vs secondary to bradycardia/hypotension. Baseline creatinine 0.8 -seems to be slowly improving Plan MAP goal > 65 Strict I&O Renal dose meds    Hypothyroid: history of Hashimoto's. Takes 150 mcg levothyroxine daily. TSH 32.  - free T4 1.2 Plan Cont current levothyroxine   DM2 w/ hyperglycemia Plan Dc steroids IV insulin   Constipation Plan Cont mirolax  Best Practice (right click and "Reselect all SmartList Selections" daily)   Diet/type: Regular consistency (see orders) DVT prophylaxis: prophylactic heparin  GI prophylaxis: [name] Lines: [name] Foley:  [name] Code Status:  full code Last date of multidisciplinary goals of care discussion [ pending, will try to reach daughter]  Critical care time: 31 min      CRITICAL CARE Performed by: Clementeen Graham

## 2022-01-19 NOTE — Progress Notes (Signed)
Electrophysiology Rounding Note  Patient Name: Marieli Rudy Date of Encounter: 01/19/2022  Primary Cardiologist: Sanda Klein, MD Electrophysiologist: New   Subjective   NAEON. Lying in bed, states she's very sleepy  Inpatient Medications    Scheduled Meds:  albuterol  10 mg Nebulization Once   aspirin EC  81 mg Oral Daily   atorvastatin  80 mg Oral Daily   Chlorhexidine Gluconate Cloth  6 each Topical Daily   docusate sodium  100 mg Oral BID   Gerhardt's butt cream   Topical BID   heparin  5,000 Units Subcutaneous Q8H   [START ON 01/20/2022] levothyroxine  150 mcg Oral Q0600   levothyroxine  100 mcg Intravenous Daily   sodium chloride flush  10-40 mL Intracatheter Q12H   Continuous Infusions:  sodium chloride Stopped (01/18/22 1943)   insulin     norepinephrine (LEVOPHED) Adult infusion 10 mcg/min (01/19/22 0627)   PRN Meds: acetaminophen, dextrose, phenol, polyethylene glycol, sodium chloride flush   Vital Signs    Vitals:   01/19/22 0630 01/19/22 0645 01/19/22 0700 01/19/22 0745  BP: 131/79 110/81 (!) 105/93   Pulse: 81 68 82   Resp: (!) '8 16 18   '$ Temp:    (!) 97.5 F (36.4 C)  TempSrc:    Axillary  SpO2: 95% 93% 93%   Weight:      Height:        Intake/Output Summary (Last 24 hours) at 01/19/2022 5956 Last data filed at 01/19/2022 0600 Gross per 24 hour  Intake 768.26 ml  Output 600 ml  Net 168.26 ml    Filed Weights   01/17/22 0430 01/18/22 0600 01/19/22 0500  Weight: 65 kg 63.8 kg 61.5 kg    Physical Exam    GEN- The patient is elderly, ill-appearing, frail  Head- normocephalic, atraumatic Eyes-  Sclera clear, conjunctiva pink Ears- hearing intact Oropharynx- clear Neck- supple Lungs- Clear to ausculation bilaterally, normal work of breathing Heart-  Somewhat irregular  rate and rhythm, no murmurs, rubs or gallops GI- soft, NT, ND, + BS Extremities- no clubbing or cyanosis. No edema Skin- no rash or lesion Psych- euthymic mood,  full affect Neuro- strength and sensation are intact  Labs    CBC Recent Labs    02/01/2022 1958 01/14/2022 2133 01/17/22 0507 01/18/22 0408 01/18/22 0915  WBC 10.9*   < > 11.3* 11.5*  --   NEUTROABS 9.6*  --   --   --   --   HGB 16.5*   < > 16.5* 18.9* 17.3*  HCT 48.9*   < > 49.0* 55.7* 51.0*  MCV 107.2*   < > 104.7* 105.3*  --   PLT 179   < > 200 199  --    < > = values in this interval not displayed.    Basic Metabolic Panel Recent Labs    01/19/2022 1958 02/06/2022 2133 01/18/22 0649 01/18/22 0915 01/19/22 0502  NA 135   < > 132* 133* 131*  K 6.8*   < > 4.7 4.6 4.5  CL 99   < > 96*  --  94*  CO2 17*   < > 23  --  24  GLUCOSE 232*   < > 223*  --  385*  BUN 81*   < > 77*  --  81*  CREATININE 2.11*   < > 1.92*  --  1.73*  CALCIUM 10.9*   < > 10.2  --  10.0  MG 2.0  --   --   --   --    < > =  values in this interval not displayed.    Liver Function Tests Recent Labs    01/17/2022 1958  AST 42*  ALT 57*  ALKPHOS 51  BILITOT 0.7  PROT 6.0*  ALBUMIN 3.7    No results for input(s): "LIPASE", "AMYLASE" in the last 72 hours. Cardiac Enzymes No results for input(s): "CKTOTAL", "CKMB", "CKMBINDEX", "TROPONINI" in the last 72 hours.   Telemetry    NSR with occasional PVCs in couplets, rarely triplets (personally reviewed)  Radiology    DG Chest Port 1 View  Result Date: 01/18/2022 CLINICAL DATA:  Hypoxia. EXAM: PORTABLE CHEST 1 VIEW COMPARISON:  01/30/2022 FINDINGS: Previous median sternotomy and CABG procedure. Bilateral pulmonary artery enlargement compatible with PA hypertension. Small bilateral pleural effusions identified. Mild diffuse interstitial edema identified. Unchanged left lower persistent retrocardiac opacification in the left base compatible with atelectasis or airspace disease. New bandlike opacity within the left midlung may reflect fluid along the oblique fissure or discoid atelectasis in the left midlung. Stable cardiomediastinal contours.  IMPRESSION: 1. Interstitial edema and pleural effusion concerning for congestive heart failure. Mild persistent left lower lobe atelectasis and or airspace disease. 2. New bandlike opacity within the left midlung may reflect fluid along the oblique fissure or discoid atelectasis in the left midlung. Electronically Signed   By: Kerby Moors M.D.   On: 01/18/2022 10:22   [place] EKG SITE RITE  Result Date: 01/18/2022 If Site Rite image not attached, placement could not be confirmed due to current cardiac rhythm.  ECHOCARDIOGRAM COMPLETE  Result Date: 01/17/2022    ECHOCARDIOGRAM REPORT   Patient Name:   KEYARAH MCROY Date of Exam: 01/17/2022 Medical Rec #:  [id number]  Height:       66.0 in Accession #:    [id number] Weight:       143.3 lb Date of Birth:  September 03, 1924  BSA:          1.736 m Patient Age:    88 years   BP:           104/66 mmHg Patient Gender: F          HR:           61 bpm. Exam Location:  Inpatient Procedure: 2D Echo, Color Doppler, Cardiac Doppler and 3D Echo Indications:    CHF-Acute Diastolic W41.32  History:        Patient has prior history of Echocardiogram examinations, most                 recent 07/10/2021. Cardiomyopathy and CHF, CAD and NSTEMI, Prior                 CABG, Pulmonary HTN, Arrythmias:Bradycardia, Atrial Fibrillation                 and PVC; Risk Factors:Hypertension, Dyslipidemia, Diabetes and                 Former Smoker.  Sonographer:    Greer Pickerel Referring Phys: Elma Comments: Image acquisition challenging due to respiratory motion, Image acquisition challenging due to patient body habitus and Image acquisition challenging due to COPD. IMPRESSIONS  1. Left ventricular ejection fraction, by estimation, is 50%. The left ventricle has mildly decreased function. The left ventricle has no regional wall motion abnormalities. There is moderate concentric left ventricular hypertrophy. Left ventricular diastolic parameters are consistent with Grade I  diastolic dysfunction (impaired relaxation).  2. D-shaped interventricular septum suggesting RV pressure/volume overload. Right ventricular  systolic function is severely reduced. The right ventricular size is severely enlarged. There is moderately elevated pulmonary artery systolic pressure. The estimated right ventricular systolic pressure is 67.1 mmHg.  3. Right atrial size was severely dilated.  4. Tricuspid valve regurgitation is mild to moderate.  5. The aortic valve is bicuspid with fused non and left coronary cusps. There does appear to be a small mobile vegetation on the aortic valve. There is moderate calcification of the aortic valve. Aortic valve regurgitation is not visualized. No doppler evidence for aortic stenosis was shown on this study but visually there does appear to be aortic stenosis present. Suggest TEE for better evaluation of aortic valve.  6. Pulmonic valve regurgitation is moderate.  7. The mitral valve is normal in structure. Trivial mitral valve regurgitation. No evidence of mitral stenosis.  8. The inferior vena cava is dilated in size with <50% respiratory variability, suggesting right atrial pressure of 15 mmHg.  9. There is a left pleural effusion. Moderate pericardial effusion primarily towards the apex with fibrinous material. No tamponade. 10. Aortic dilatation noted. There is mild dilatation of the ascending aorta, measuring 41 mm. FINDINGS  Left Ventricle: Left ventricular ejection fraction, by estimation, is 50%. The left ventricle has mildly decreased function. The left ventricle has no regional wall motion abnormalities. The left ventricular internal cavity size was small. There is moderate concentric left ventricular hypertrophy. Left ventricular diastolic parameters are consistent with Grade I diastolic dysfunction (impaired relaxation). Right Ventricle: D-shaped interventricular septum suggesting RV pressure/volume overload. The right ventricular size is severely enlarged.  No increase in right ventricular wall thickness. Right ventricular systolic function is severely reduced. There is moderately elevated pulmonary artery systolic pressure. The tricuspid regurgitant velocity is 3.30 m/s, and with an assumed right atrial pressure of 15 mmHg, the estimated right ventricular systolic pressure is 24.5 mmHg. Left Atrium: Left atrial size was normal in size. Right Atrium: Right atrial size was severely dilated. Pericardium: There is a left pleural effusion. A moderately sized pericardial effusion is present. Mitral Valve: The mitral valve is normal in structure. Mild to moderate mitral annular calcification. Trivial mitral valve regurgitation. No evidence of mitral valve stenosis. Tricuspid Valve: The tricuspid valve is normal in structure. Tricuspid valve regurgitation is mild to moderate. Aortic Valve: Small mobile vegetation on aortic valve. The aortic valve is bicuspid. There is severe calcifcation of the aortic valve. Aortic valve regurgitation is not visualized. No aortic stenosis is present. Pulmonic Valve: The pulmonic valve was normal in structure. Pulmonic valve regurgitation is moderate. Aorta: The aortic root is normal in size and structure and aortic dilatation noted. There is mild dilatation of the ascending aorta, measuring 41 mm. Venous: The inferior vena cava is dilated in size with less than 50% respiratory variability, suggesting right atrial pressure of 15 mmHg. IAS/Shunts: No atrial level shunt detected by color flow Doppler.  LEFT VENTRICLE PLAX 2D LVIDd:         3.40 cm   Diastology LVIDs:         2.30 cm   LV e' medial:    3.49 cm/s LV PW:         1.30 cm   LV E/e' medial:  15.3 LV IVS:        1.30 cm   LV e' lateral:   3.97 cm/s LVOT diam:     1.80 cm   LV E/e' lateral: 13.5 LV SV:         28 LV SV Index:  16 LVOT Area:     2.54 cm                           3D Volume EF:                          3D EF:        47 %                          LV EDV:       63 ml                           LV ESV:       33 ml                          LV SV:        30 ml RIGHT VENTRICLE RV S prime:     6.89 cm/s TAPSE (M-mode): 0.8 cm LEFT ATRIUM             Index        RIGHT ATRIUM           Index LA diam:        2.90 cm 1.67 cm/m   RA Area:     23.60 cm LA Vol (A2C):   47.2 ml 27.19 ml/m  RA Volume:   88.60 ml  51.05 ml/m LA Vol (A4C):   28.6 ml 16.48 ml/m LA Biplane Vol: 37.0 ml 21.32 ml/m  AORTIC VALVE             PULMONIC VALVE LVOT Vmax:   57.50 cm/s  PR End Diast Vel: 10.50 msec LVOT Vmean:  36.700 cm/s LVOT VTI:    0.110 m  AORTA Ao Root diam: 3.30 cm Ao Asc diam:  4.10 cm MITRAL VALVE               TRICUSPID VALVE MV Area (PHT): 2.01 cm    TR Peak grad:   43.6 mmHg MV Decel Time: 377 msec    TR Vmax:        330.00 cm/s MV E velocity: 53.40 cm/s MV A velocity: 60.10 cm/s  SHUNTS MV E/A ratio:  0.89        Systemic VTI:  0.11 m                            Systemic Diam: 1.80 cm Dalton McleanMD Electronically signed by Franki Monte Signature Date/Time: 01/17/2022/4:21:50 PM    Final     Patient Profile     Reema Chick is a 88 y.o. female with a hx of CAD (CABG 2015), post-op AFib (nont known to have further Afib and not on a/c), HTN, HLD, DM, ICM that recovered post CABG, chronic CHF (diastolic),  who is being seen 01/17/2022 for the evaluation of symptomatic bradycardia at the request of Dr. Dellia Cloud.   Assessment & Plan    Symptomatic bradycardia No further significant bradycardia.  - cont to hold BB No plans for PPM at this    2. Code Status Daughter has confirmed code status.  Pt is no intubation or chest compressions.  Cardioversion/ACLS meds OK.   3. Hyperkalemia Down to 4.5 this am  4. ? PAF Her underlying  on arrival is unclear, she had distant post op AF after her CABG.  CHA2DS2/VASc would be 7 if It is more clear AF is noted.     5. Hypothyroidism Per primary/CCM Free T4 1.22, TSH 32.325    EP will sign off, but remains available. Please  re-consult if needed.     For questions or updates, please contact [place] Please consult [email or web address] for contact info under Cardiology/STEMI.  Signed, Mamie Levers, NP  01/19/2022, 8:07 AM

## 2022-01-19 NOTE — IPAL (Signed)
  Interdisciplinary Goals of Care Family Meeting   Date carried out: 01/19/2022  Location of the meeting: Phone conference  Member's involved: Nurse Practitioner and Family Member or next of kin  Durable Power of Attorney or acting medical decision maker: Neila Gear   Discussion: We discussed goals of care for [employer] .  Specifically we discussed her advanced right sided heart failure, her continued decline over the course of the day and the worsening prognosis overall. [name] shared with me that Darcie does not want aggressive interventions and certainly would not want any life prolonging interventions especially given her decline over the last year.   Code status: Full DNR  Disposition: Continue current acute care  Time spent for the meeting: 41 min     Clementeen Graham, NP  01/19/2022, 4:59 PM

## 2022-01-20 DIAGNOSIS — Z515 Encounter for palliative care: Secondary | ICD-10-CM | POA: Diagnosis not present

## 2022-01-20 DIAGNOSIS — R001 Bradycardia, unspecified: Secondary | ICD-10-CM | POA: Diagnosis not present

## 2022-01-20 DIAGNOSIS — R570 Cardiogenic shock: Secondary | ICD-10-CM | POA: Diagnosis not present

## 2022-01-20 DIAGNOSIS — J9621 Acute and chronic respiratory failure with hypoxia: Secondary | ICD-10-CM | POA: Diagnosis not present

## 2022-01-20 DIAGNOSIS — J90 Pleural effusion, not elsewhere classified: Secondary | ICD-10-CM | POA: Diagnosis not present

## 2022-01-20 DIAGNOSIS — I5023 Acute on chronic systolic (congestive) heart failure: Secondary | ICD-10-CM

## 2022-01-20 DIAGNOSIS — Z7189 Other specified counseling: Secondary | ICD-10-CM

## 2022-01-20 LAB — GLUCOSE, CAPILLARY
Glucose-Capillary: 111 mg/dL — ABNORMAL HIGH (ref 70–99)
Glucose-Capillary: 115 mg/dL — ABNORMAL HIGH (ref 70–99)
Glucose-Capillary: 117 mg/dL — ABNORMAL HIGH (ref 70–99)
Glucose-Capillary: 131 mg/dL — ABNORMAL HIGH (ref 70–99)
Glucose-Capillary: 134 mg/dL — ABNORMAL HIGH (ref 70–99)
Glucose-Capillary: 135 mg/dL — ABNORMAL HIGH (ref 70–99)
Glucose-Capillary: 168 mg/dL — ABNORMAL HIGH (ref 70–99)
Glucose-Capillary: 200 mg/dL — ABNORMAL HIGH (ref 70–99)
Glucose-Capillary: 206 mg/dL — ABNORMAL HIGH (ref 70–99)
Glucose-Capillary: 229 mg/dL — ABNORMAL HIGH (ref 70–99)
Glucose-Capillary: 251 mg/dL — ABNORMAL HIGH (ref 70–99)
Glucose-Capillary: 280 mg/dL — ABNORMAL HIGH (ref 70–99)
Glucose-Capillary: 307 mg/dL — ABNORMAL HIGH (ref 70–99)
Glucose-Capillary: 348 mg/dL — ABNORMAL HIGH (ref 70–99)
Glucose-Capillary: 375 mg/dL — ABNORMAL HIGH (ref 70–99)
Glucose-Capillary: 420 mg/dL — ABNORMAL HIGH (ref 70–99)
Glucose-Capillary: 426 mg/dL — ABNORMAL HIGH (ref 70–99)

## 2022-01-20 LAB — BASIC METABOLIC PANEL
Anion gap: 7 (ref 5–15)
Anion gap: 11 (ref 5–15)
BUN: 80 mg/dL — ABNORMAL HIGH (ref 8–23)
BUN: 77 mg/dL — ABNORMAL HIGH (ref 8–23)
CO2: 25 mmol/L (ref 22–32)
CO2: 24 mmol/L (ref 22–32)
Calcium: 9.2 mg/dL (ref 8.9–10.3)
Calcium: 9 mg/dL (ref 8.9–10.3)
Chloride: 94 mmol/L — ABNORMAL LOW (ref 98–111)
Chloride: 88 mmol/L — ABNORMAL LOW (ref 98–111)
Creatinine, Ser: 1.59 mg/dL — ABNORMAL HIGH (ref 0.44–1.00)
Creatinine, Ser: 1.61 mg/dL — ABNORMAL HIGH (ref 0.44–1.00)
GFR, Estimated: 30 mL/min — ABNORMAL LOW (ref >60)
GFR, Estimated: 29 mL/min — ABNORMAL LOW (ref >60)
Glucose, Bld: 316 mg/dL — ABNORMAL HIGH (ref 70–99)
Glucose, Bld: 419 mg/dL — ABNORMAL HIGH (ref 70–99)
Potassium: 4.5 mmol/L (ref 3.5–5.1)
Potassium: 4.7 mmol/L (ref 3.5–5.1)
Sodium: 126 mmol/L — ABNORMAL LOW (ref 135–145)
Sodium: 123 mmol/L — ABNORMAL LOW (ref 135–145)

## 2022-01-20 LAB — COOXEMETRY PANEL
Carboxyhemoglobin: 1.3 % (ref 0.5–1.5)
Methemoglobin: 1 % (ref 0.0–1.5)
O2 Saturation: 46.3 %
Total hemoglobin: 16.6 g/dL — ABNORMAL HIGH (ref 12.0–16.0)

## 2022-01-20 LAB — CBC
HCT: 49.6 % — ABNORMAL HIGH (ref 36.0–46.0)
Hemoglobin: 16.3 g/dL — ABNORMAL HIGH (ref 12.0–15.0)
MCH: 35.1 pg — ABNORMAL HIGH (ref 26.0–34.0)
MCHC: 32.9 g/dL (ref 30.0–36.0)
MCV: 106.9 fL — ABNORMAL HIGH (ref 80.0–100.0)
Platelets: 181 10*3/uL (ref 150–400)
RBC: 4.64 MIL/uL (ref 3.87–5.11)
RDW: 14.2 % (ref 11.5–15.5)
WBC: 10.7 10*3/uL — ABNORMAL HIGH (ref 4.0–10.5)
nRBC: 0 % (ref 0.0–0.2)

## 2022-01-20 MED ORDER — PROCHLORPERAZINE EDISYLATE 10 MG/2ML IJ SOLN
10.0000 mg | Freq: Four times a day (QID) | INTRAMUSCULAR | Status: DC | PRN
  Administered 2022-01-21: 10 mg via INTRAVENOUS
  Filled 2022-01-20 (×2): qty 2

## 2022-01-20 MED ORDER — ACETAMINOPHEN 325 MG PO TABS
650.0000 mg | ORAL_TABLET | ORAL | Status: DC | PRN
  Administered 2022-01-20: 650 mg via ORAL
  Filled 2022-01-20: qty 2

## 2022-01-20 MED ORDER — INSULIN DETEMIR 100 UNIT/ML ~~LOC~~ SOLN
5.0000 [IU] | Freq: Two times a day (BID) | SUBCUTANEOUS | Status: DC
  Administered 2022-01-20 (×2): 5 [IU] via SUBCUTANEOUS
  Filled 2022-01-20 (×3): qty 0.05

## 2022-01-20 MED ORDER — NOREPINEPHRINE 16 MG/250ML-% IV SOLN
0.0000 ug/min | INTRAVENOUS | Status: DC
  Administered 2022-01-20: 1 ug/min via INTRAVENOUS
  Administered 2022-01-21: 13 ug/min via INTRAVENOUS
  Filled 2022-01-20 (×2): qty 250

## 2022-01-20 MED ORDER — FUROSEMIDE 10 MG/ML IJ SOLN
20.0000 mg | Freq: Once | INTRAMUSCULAR | Status: AC
  Administered 2022-01-20: 20 mg via INTRAVENOUS
  Filled 2022-01-20: qty 2

## 2022-01-20 MED ORDER — ONDANSETRON HCL 4 MG/2ML IJ SOLN
4.0000 mg | Freq: Four times a day (QID) | INTRAMUSCULAR | Status: DC
  Administered 2022-01-20 – 2022-01-21 (×6): 4 mg via INTRAVENOUS
  Filled 2022-01-20 (×6): qty 2

## 2022-01-20 MED ORDER — ACETAMINOPHEN 160 MG/5ML PO SOLN
650.0000 mg | ORAL | Status: DC | PRN
  Administered 2022-01-20: 650 mg via ORAL
  Filled 2022-01-20: qty 20.3

## 2022-01-20 MED ORDER — DOBUTAMINE INFUSION FOR EP/ECHO/NUC (1000 MCG/ML)
2.5000 ug/kg/min | INTRAVENOUS | Status: DC
  Administered 2022-01-20: 2.5 ug/kg/min via INTRAVENOUS
  Filled 2022-01-20 (×2): qty 250

## 2022-01-20 MED ORDER — DEXTROSE 50 % IV SOLN
0.0000 mL | INTRAVENOUS | Status: DC | PRN
  Administered 2022-01-21: 35 mL via INTRAVENOUS
  Filled 2022-01-20: qty 50

## 2022-01-20 MED ORDER — FUROSEMIDE 10 MG/ML IJ SOLN
20.0000 mg | Freq: Four times a day (QID) | INTRAMUSCULAR | Status: AC
  Administered 2022-01-20 – 2022-01-21 (×4): 20 mg via INTRAVENOUS
  Filled 2022-01-20 (×4): qty 2

## 2022-01-20 MED ORDER — INSULIN ASPART 100 UNIT/ML IJ SOLN
1.0000 [IU] | INTRAMUSCULAR | Status: DC
  Administered 2022-01-20: 3 [IU] via SUBCUTANEOUS

## 2022-01-20 MED ORDER — ACETAMINOPHEN 160 MG/5ML PO SOLN: 650.0000 mg | ORAL | Status: DC | PRN

## 2022-01-20 MED ORDER — HYDROMORPHONE HCL 1 MG/ML IJ SOLN
0.5000 mg | INTRAMUSCULAR | Status: DC | PRN
  Administered 2022-01-20: 0.5 mg via INTRAVENOUS
  Filled 2022-01-20: qty 0.5

## 2022-01-20 MED ORDER — INSULIN REGULAR(HUMAN) IN NACL 100-0.9 UT/100ML-% IV SOLN
INTRAVENOUS | Status: DC
  Administered 2022-01-20: 8 [IU]/h via INTRAVENOUS
  Filled 2022-01-20 (×2): qty 100

## 2022-01-20 NOTE — Progress Notes (Signed)
eLink Physician-Brief Progress Note Patient Name: Marissa Jefferson DOB: 1924-05-08 MRN: [id number]   Date of Service  01/20/2022  HPI/Events of Note  Patient needs a PRN order for pain.  eICU Interventions  PRN Tylenol ordered.        [name] Aveion Nguyen 01/20/2022, 4:11 AM

## 2022-01-20 NOTE — Progress Notes (Signed)
Progress Note  Patient Name: Marissa Jefferson Date of Encounter: 01/20/2022  Primary Cardiologist: Sanda Klein, MD  Subjective   No acute events overnight.  Inpatient Medications    Scheduled Meds:  albuterol  10 mg Nebulization Once   aspirin EC  81 mg Oral Daily   atorvastatin  80 mg Oral Daily   Chlorhexidine Gluconate Cloth  6 each Topical Daily   docusate sodium  100 mg Oral BID   feeding supplement  1 Bottle Oral TID BM   furosemide  20 mg Intravenous Q6H   Gerhardt's butt cream   Topical BID   heparin  5,000 Units Subcutaneous Q8H   insulin aspart  1-3 Units Subcutaneous Q4H   insulin detemir  5 Units Subcutaneous Q12H   levothyroxine  150 mcg Oral Q0600   midodrine  5 mg Oral TID WC   ondansetron (ZOFRAN) IV  4 mg Intravenous Q6H   sodium chloride flush  10-40 mL Intracatheter Q12H   Continuous Infusions:  sodium chloride Stopped (01/18/22 1943)   DOBUTamine 2.5 mcg/kg/min (01/20/22 1000)   lactated ringers     norepinephrine (LEVOPHED) Adult infusion 11 mcg/min (01/20/22 1000)   vasopressin 0.03 Units/min (01/20/22 1000)   PRN Meds: acetaminophen, metoCLOPramide (REGLAN) injection, phenol, polyethylene glycol, sodium chloride flush   Vital Signs    Vitals:   01/20/22 0930 01/20/22 0945 01/20/22 1000 01/20/22 1015  BP: 112/75 116/73 122/79 116/88  Pulse: 78 74 73 75  Resp: '14 14 14 14  '$ Temp:      TempSrc:      SpO2: 100% 98% 98% 97%  Weight:      Height:        Intake/Output Summary (Last 24 hours) at 01/20/2022 1044 Last data filed at 01/20/2022 1000 Gross per 24 hour  Intake 1719.42 ml  Output 130 ml  Net 1589.42 ml   Filed Weights   01/18/22 0600 01/19/22 0500 01/20/22 0500  Weight: 63.8 kg 61.5 kg 63.9 kg    Telemetry    Personally reviewed, normal sinus rhythm with PAC and PVC, heart rate maintaining 70 to 80s.  ECG    An ECG dated 01/19/2022 was personally reviewed today and demonstrated:  Normal sinus rhythm  Physical Exam    GEN: No acute distress.   Neck: No JVD. Cardiac: RRR, no murmur, rub, or gallop.  Respiratory: Nonlabored.  Bilateral rales present GI: Soft, nontender, bowel sounds present. MS: 1+ pitting edema edema in lower extremities; No deformity.  Cool lower over extremities Neuro:  Nonfocal. Psych: Alert and oriented x 3. Normal affect.  Labs    Chemistry Recent Labs  Lab 02/05/2022 1958 01/22/2022 2133 01/19/22 1522 01/20/22 0412 01/20/22 0853  NA 135   < > 135 126* 123*  K 6.8*   < > 3.9 4.5 4.7  CL 99   < > 101 94* 88*  CO2 17*   < > '25 25 24  '$ GLUCOSE 232*   < > 165* 316* 419*  BUN 81*   < > 84* 80* 77*  CREATININE 2.11*   < > 1.86* 1.59* 1.61*  CALCIUM 10.9*   < > 9.6 9.2 9.0  PROT 6.0*  --   --   --   --   ALBUMIN 3.7  --   --   --   --   AST 42*  --   --   --   --   ALT 57*  --   --   --   --  ALKPHOS 51  --   --   --   --   BILITOT 0.7  --   --   --   --   GFRNONAA 21*   < > 25* 30* 29*  ANIONGAP 19*   < > '9 7 11   '$ < > = values in this interval not displayed.     Hematology Recent Labs  Lab 01/17/22 0507 01/18/22 0408 01/18/22 0915 01/20/22 0412  WBC 11.3* 11.5*  --  10.7*  RBC 4.68 5.29*  --  4.64  HGB 16.5* 18.9* 17.3* 16.3*  HCT 49.0* 55.7* 51.0* 49.6*  MCV 104.7* 105.3*  --  106.9*  MCH 35.3* 35.7*  --  35.1*  MCHC 33.7 33.9  --  32.9  RDW 14.3 14.1  --  14.2  PLT 200 199  --  181    Cardiac Enzymes Recent Labs  Lab 01/30/2022 1958 01/10/2022 2133  TROPONINIHS 66* 61*    BNP Recent Labs  Lab 02/01/2022 1958 02/06/2022 2133 01/18/22 0408  BNP 3,526.7* 2,830.5* 2,980.0*     DDimerNo results for input(s): "DDIMER" in the last 168 hours.   Radiology    No results found.  Cardiac Studies   Echo from 01/2022 LVEF 38% RV systolic function is severely reduced, RV size is severely enlarged, moderately elevated pulmonary artery systolic pressures. There is an echodensity on the aortic valve leaflet  Assessment & Plan   Patient is a 88 year old  F known to have CAD status post CABG in 2014, severe RV failure, postop A-fib without known recurrence, HTN, DM, hypothyroidism was brought to the hospital for symptomatic bradycardia.  #Acute on chronic right ventricular systolic heart failure exacerbation with preserved left ventricular systolic function -Patient has bilateral rales with 1+ pitting edema in lower extremities.  Patient did not tolerate milrinone, started to have nausea after starting milrinone and so had to be held. We will start dobutamine 2.5 to support RV and start IV Lasix 20 mg every 6h and reassess her response to diuresis. Map goal is 65. If she responds well to the current regimen, will attempt to wean off vasopressin first. -Continue GOC discussion with the family  #Echodensity on the aortic valve leaflet -Patient will not tolerate sedation and hence not a candidate for TEE.  If patient's clinical status improves, can pursue outpatient imaging.  #Symptomatic bradycardia likely secondary to severe hypothyroidism/hyperkalemia/beta-blockers (coreg), resolved  CRITICAL CARE Performed by: Vangie Bicker, MD   Total critical care time: 45 minutes   Critical care time was exclusive of separately billable procedures and treating other patients.   Critical care was necessary to treat or prevent imminent or life-threatening deterioration.   Critical care was time spent personally by me (independent of APPs or residents) on the following activities: development of treatment plan with patient and/or surrogate as well as nursing, discussions with consultants, evaluation of patient's response to treatment, examination of patient, obtaining history from patient or surrogate, ordering and performing treatments and interventions, ordering and review of laboratory studies, ordering and review of radiographic studies, pulse oximetry and re-evaluation of patient's condition.    Signed, Chalmers Guest, MD  01/20/2022,  10:44 AM

## 2022-01-20 NOTE — Progress Notes (Signed)
NAME:  Marissa Jefferson, MRN:  [id number], DOB:  Jan 24, 1925, LOS: 4 ADMISSION DATE:  02/05/2022, CONSULTATION DATE:  11/7 REFERRING MD:  Dr Dellia Cloud , CHIEF COMPLAINT:  Hyperkalemia   History of Present Illness:  88 year old female with PMH as below, which is significant for HFrEF (LVEF 20% in 2015 now recovered post CABG x 4 to 55%), DM, HTN, Hypothyroid, and now has diastolic CHF.  Her daughter tells me she has several admissions in the past for congestive heart failure during which she has required IV diuresis.  Most recently she was discharged on home oxygen and wears 4 L continuously.  She presented to [name] ED 11/7 with complaints of bradycardia and hypotension.  She lives at home with assistance from a home health aide who came to check on her on the day of presentation and found oxygen saturation to be 75%.  EMS was called and upon their arrival she was also noted to be bradycardic into the 20s and hypotensive with systolic blood pressures in the 80s.  EMS treated with 1 mg of atropine and heart rate improved into the 50s with subsequent blood pressure improvement.  EKG in the ED was questionable for junctional escape versus slow atrial fibrillation.  Cardiology was consulted and admitted the patient to ICU with dopamine infusion and EP consultation.  Chemistries then resulted and showed potassium 6.8.  PCCM was consulted for hyperkalemia.  Pertinent  Medical History   has a past medical history of Acute CHF (congestive heart failure) ([place]) (06/17/2020), Arthritis, Breast cancer ([place]), CAD (coronary artery disease), native coronary artery (10/06/2013), CHF (congestive heart failure) ([place]) (06/17/2020), Chronic combined systolic and diastolic CHF, NYHA class 2 ([place]) (09/2013), CVA (cerebral vascular accident) ([place]) (1968), DM2 (diabetes mellitus, type 2) ([place]), GERD (gastroesophageal reflux disease), Hyperlipidemia, Hypertension, Hypothyroidism, Ischemic dilated cardiomyopathy ([place]) (09/2013), NSVT  (nonsustained ventricular tachycardia) ([place]) (09/2013), OAB (overactive bladder), Osteopenia, and 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   Significant Hospital Events: Including procedures, antibiotic start and stop dates in addition to other pertinent events   11/7 admit with bradycardia, hyperkalemia 11/8 HR improved with dopamine, K 5.6 11/9 still on dopamine HR stable changed to NE 11/10 still on pressors. SCVO2 low. Advanced HF team asked to eval. Stopped systemic steroids. Started on insulin gtt  11/10 [place] DNR/DNI.  Interim History / Subjective:  Started on dobutamine and vasopressin by cardiology.  Objective   Blood pressure (!) 84/54, pulse 67, temperature 97.6 F (36.4 C), temperature source Axillary, resp. rate 16, height '5\' 6"'$  (1.676 m), weight 63.9 kg, SpO2 92 %. CVP:  [0 mmHg-27 mmHg] 12 mmHg      Intake/Output Summary (Last 24 hours) at 01/20/2022 1332 Last data filed at 01/20/2022 1300 Gross per 24 hour  Intake 1772.76 ml  Output 210 ml  Net 1562.76 ml    Filed Weights   01/18/22 0600 01/19/22 0500 01/20/22 0500  Weight: 63.8 kg 61.5 kg 63.9 kg   General frail 88 year old female. She is resting in bed. Denies CP or distress HENT NCAT no JVD Pulm crackles bases no accessory use  Card reg irreg Ext warm + pitting edema lower ext.  Abd soft  Neuro intact  Ancillary tests personally reviewed:   Sodium dropped to 123, creatinine increasing 161 Marked hyperglycemia  Assessment & Plan:   Acute on chronic biventricular HF w/ cardiogenic shock 2/2  EF 50% w/ mildly reduced LVF, mod LVH, grade I diastolic dysfxn, Has Severe RV  dysfxn w. Severely RV Plan Cardiology has started patient on inotropic support and intermittent diuresis Unsure of endpoint given that this is the result of progressive advanced PTH.   AF Plan  Holding BB Will need to decide on [facility] however at 88 years old don't feel strongly about this    Type 2 diabetes with poor control Currently on insulin infusion.   h/o htn and HLD Plan Cont tele Cont asa Holding norvasc, entresto and coreg  Acute on chronic Chronic hypoxic respiratory failure 2/2 pulmonary edema looks like superimposed on some degree of ILD Cont supplemental oxygen  Plan Volume removal as tolerated Wean FIO2 IS OOB  AKI: increased lasix dosing recently due to weight gain vs ARB related vs secondary to bradycardia/hypotension. Baseline creatinine 0.8 -seems to be slowly improving Plan MAP goal > 65 Strict I&O Renal dose meds    Hypothyroid: history of Hashimoto's. Takes 150 mcg levothyroxine daily. TSH 32.  - free T4 1.2 Plan Cont current levothyroxine   DM2 w/ hyperglycemia Plan Dc steroids IV insulin   Constipation Plan Cont mirolax  Best Practice (right click and "Reselect all SmartList Selections" daily)   Diet/type: Regular consistency (see orders) DVT prophylaxis: prophylactic heparin  GI prophylaxis: [name] Lines: [name] Foley:  [name] Code Status:  full code Last date of multidisciplinary goals of care discussion [ pending, will try to reach daughter]  Kipp Brood, MD [facility] ICU Physician [place]  Pager: [phone] Mobile: [phone] After hours: [phone].

## 2022-01-20 NOTE — Progress Notes (Signed)
eLink Physician-Brief Progress Note Patient Name: Nena Hampe DOB: 06/15/1924 MRN: [id number]   Date of Service  01/20/2022  HPI/Events of Note  Blood sugar controlled and anion gap normal.  eICU Interventions  Patient transitioned to sensitive SSI scale with CBG monitoring.        [name] Eadie Repetto 01/20/2022, 12:57 AM

## 2022-01-21 DIAGNOSIS — Z7189 Other specified counseling: Secondary | ICD-10-CM | POA: Diagnosis not present

## 2022-01-21 DIAGNOSIS — Z515 Encounter for palliative care: Secondary | ICD-10-CM | POA: Diagnosis not present

## 2022-01-21 LAB — BASIC METABOLIC PANEL
Anion gap: 14 (ref 5–15)
BUN: 71 mg/dL — ABNORMAL HIGH (ref 8–23)
CO2: 23 mmol/L (ref 22–32)
Calcium: 8.7 mg/dL — ABNORMAL LOW (ref 8.9–10.3)
Chloride: 98 mmol/L (ref 98–111)
Creatinine, Ser: 1.3 mg/dL — ABNORMAL HIGH (ref 0.44–1.00)
GFR, Estimated: 38 mL/min — ABNORMAL LOW (ref >60)
Glucose, Bld: 185 mg/dL — ABNORMAL HIGH (ref 70–99)
Potassium: 3.4 mmol/L — ABNORMAL LOW (ref 3.5–5.1)
Sodium: 135 mmol/L (ref 135–145)

## 2022-01-21 LAB — COOXEMETRY PANEL
Carboxyhemoglobin: 1.6 % — ABNORMAL HIGH (ref 0.5–1.5)
Methemoglobin: <0.7 % (ref 0.0–1.5)
O2 Saturation: 53.5 %
Total hemoglobin: 15.6 g/dL (ref 12.0–16.0)

## 2022-01-21 LAB — GLUCOSE, CAPILLARY
Glucose-Capillary: 117 mg/dL — ABNORMAL HIGH (ref 70–99)
Glucose-Capillary: 119 mg/dL — ABNORMAL HIGH (ref 70–99)
Glucose-Capillary: 133 mg/dL — ABNORMAL HIGH (ref 70–99)
Glucose-Capillary: 136 mg/dL — ABNORMAL HIGH (ref 70–99)
Glucose-Capillary: 169 mg/dL — ABNORMAL HIGH (ref 70–99)
Glucose-Capillary: 245 mg/dL — ABNORMAL HIGH (ref 70–99)
Glucose-Capillary: 469 mg/dL — ABNORMAL HIGH (ref 70–99)
Glucose-Capillary: 65 mg/dL — ABNORMAL LOW (ref 70–99)

## 2022-01-21 MED ORDER — HYDROMORPHONE HCL 1 MG/ML IJ SOLN: 0.2500 mg | INTRAMUSCULAR | Status: DC | PRN

## 2022-01-21 MED ORDER — GLYCOPYRROLATE 0.2 MG/ML IJ SOLN: 0.2000 mg | INTRAMUSCULAR | Status: DC | PRN

## 2022-01-21 MED ORDER — HYDROMORPHONE HCL 1 MG/ML IJ SOLN
0.2500 mg | INTRAMUSCULAR | Status: DC | PRN
  Administered 2022-01-21: 0.25 mg via INTRAVENOUS
  Filled 2022-01-21: qty 0.5

## 2022-01-21 MED ORDER — BIOTENE DRY MOUTH MT LIQD: 15.0000 mL | OROMUCOSAL | Status: DC | PRN

## 2022-01-21 MED ORDER — HYDROMORPHONE HCL-NACL 50-0.9 MG/50ML-% IV SOLN
0.5000 mg/h | INTRAVENOUS | Status: DC
  Administered 2022-01-21: 0.5 mg/h via INTRAVENOUS
  Filled 2022-01-21: qty 50

## 2022-01-21 MED ORDER — GLYCOPYRROLATE 1 MG PO TABS: 1.0000 mg | ORAL_TABLET | ORAL | Status: DC | PRN

## 2022-01-21 MED ORDER — INSULIN ASPART 100 UNIT/ML IJ SOLN
2.0000 [IU] | INTRAMUSCULAR | Status: DC
  Administered 2022-01-21: 4 [IU] via SUBCUTANEOUS
  Administered 2022-01-21: 6 [IU] via SUBCUTANEOUS

## 2022-01-21 MED ORDER — POLYVINYL ALCOHOL 1.4 % OP SOLN: 1.0000 [drp] | Freq: Four times a day (QID) | OPHTHALMIC | Status: DC | PRN

## 2022-01-21 MED ORDER — MIDAZOLAM BOLUS VIA INFUSION
1.0000 mg | INTRAVENOUS | Status: DC | PRN
  Administered 2022-01-21 (×2): 1 mg via INTRAVENOUS

## 2022-01-21 MED ORDER — FUROSEMIDE 10 MG/ML IJ SOLN
20.0000 mg | Freq: Two times a day (BID) | INTRAMUSCULAR | Status: DC
  Administered 2022-01-21: 20 mg via INTRAVENOUS
  Filled 2022-01-21: qty 2

## 2022-01-21 MED ORDER — INSULIN DETEMIR 100 UNIT/ML ~~LOC~~ SOLN
8.0000 [IU] | Freq: Two times a day (BID) | SUBCUTANEOUS | Status: DC
  Administered 2022-01-21 (×2): 8 [IU] via SUBCUTANEOUS
  Filled 2022-01-21 (×3): qty 0.08

## 2022-01-21 MED ORDER — HYDROMORPHONE BOLUS VIA INFUSION
0.5000 mg | INTRAVENOUS | Status: DC | PRN
  Administered 2022-01-21 (×2): 0.5 mg via INTRAVENOUS

## 2022-01-21 MED ORDER — BISACODYL 10 MG RE SUPP: 10.0000 mg | Freq: Every day | RECTAL | Status: DC | PRN

## 2022-01-21 MED ORDER — ACETAMINOPHEN 500 MG PO TABS: 500.0000 mg | ORAL_TABLET | Freq: Two times a day (BID) | ORAL | Status: DC

## 2022-01-21 MED ORDER — POTASSIUM CHLORIDE 10 MEQ/50ML IV SOLN
10.0000 meq | INTRAVENOUS | Status: AC
  Administered 2022-01-21: 10 meq via INTRAVENOUS
  Filled 2022-01-21: qty 50

## 2022-01-21 MED ORDER — MIDAZOLAM-SODIUM CHLORIDE 100-0.9 MG/100ML-% IV SOLN
0.5000 mg/h | INTRAVENOUS | Status: DC
  Administered 2022-01-21: 0.5 mg/h via INTRAVENOUS
  Filled 2022-01-21: qty 100

## 2022-01-21 MED ORDER — HYDROMORPHONE HCL 1 MG/ML IJ SOLN
0.2500 mg | Freq: Four times a day (QID) | INTRAMUSCULAR | Status: DC | PRN
  Administered 2022-01-21: 0.25 mg via INTRAVENOUS
  Filled 2022-01-21: qty 0.5

## 2022-01-23 ENCOUNTER — Telehealth: Payer: Self-pay | Admitting: Cardiovascular Disease

## 2022-01-23 LAB — CULTURE, BLOOD (ROUTINE X 2)
Culture: NO GROWTH
Culture: NO GROWTH
Special Requests: ADEQUATE
Special Requests: ADEQUATE

## 2022-01-23 NOTE — Telephone Encounter (Signed)
[facility] called stating the death certificate is waiting to be signed it the [facility] electronic system.   They tried to get the PCP to sign it be he said it should be cardiology that signs it. Patient passed way 01-26-22, she was inpatient at [facility].

## 2022-01-23 NOTE — Telephone Encounter (Signed)
Will route to MD

## 2022-02-09 NOTE — Progress Notes (Addendum)
Progress Note  Patient Name: Marissa Jefferson Date of Encounter: 02/12/2022  Primary Cardiologist: Sanda Klein, MD  Subjective   Patient was given Dilaudid last night and currently she has been feeling drowsy.  Daughter at bedside.  Inpatient Medications    Scheduled Meds:  acetaminophen  500 mg Oral BID   albuterol  10 mg Nebulization Once   aspirin EC  81 mg Oral Daily   atorvastatin  80 mg Oral Daily   Chlorhexidine Gluconate Cloth  6 each Topical Daily   docusate sodium  100 mg Oral BID   feeding supplement  1 Bottle Oral TID BM   furosemide  20 mg Intravenous BID   Gerhardt's butt cream   Topical BID   heparin  5,000 Units Subcutaneous Q8H   insulin aspart  2-6 Units Subcutaneous Q4H   insulin detemir  8 Units Subcutaneous Q12H   levothyroxine  150 mcg Oral Q0600   midodrine  5 mg Oral TID WC   ondansetron (ZOFRAN) IV  4 mg Intravenous Q6H   sodium chloride flush  10-40 mL Intracatheter Q12H   Continuous Infusions:  sodium chloride Stopped (01/18/22 1943)   insulin Stopped (February 12, 2022 0404)   lactated ringers     norepinephrine (LEVOPHED) Adult infusion 12 mcg/min (2022-02-12 0600)   potassium chloride 10 mEq (02/12/22 0854)   vasopressin 0.02 Units/min (2022-02-12 0805)   PRN Meds: acetaminophen, bisacodyl, dextrose, HYDROmorphone (DILAUDID) injection, metoCLOPramide (REGLAN) injection, polyethylene glycol, prochlorperazine, sodium chloride flush   Vital Signs    Vitals:   12-Feb-2022 0300 2022-02-12 0400 2022-02-12 0500 02/12/22 0600  BP: 94/62 (!) 87/65 90/73 (!) 80/49  Pulse: (!) 58 69 (!) 55 (!) 48  Resp: '13 13 12 '$ (!) 9  Temp:  (!) 96.2 F (35.7 C)    TempSrc:  Axillary    SpO2: 94% 95% 96% 92%  Weight:   65.9 kg   Height:        Intake/Output Summary (Last 24 hours) at 12-Feb-2022 0912 Last data filed at Feb 12, 2022 0800 Gross per 24 hour  Intake 791.75 ml  Output 1030 ml  Net -238.25 ml   Filed Weights   01/19/22 0500 01/20/22 0500 02/12/2022 0500   Weight: 61.5 kg 63.9 kg 65.9 kg    Telemetry    Personally reviewed, normal sinus rhythm but with frequent PVCs after starting dobutamine drip  ECG    An ECG dated 01/19/2022 was personally reviewed today and demonstrated:  Normal sinus rhythm  Physical Exam   GEN: No acute distress.   Neck: No JVD. Cardiac: RRR, no murmur, rub, or gallop.  Respiratory: Nonlabored.  Bilateral rales present, improved from yesterday GI: Soft, nontender, bowel sounds present. MS: 1+ pitting edema edema in lower extremities; No deformity.  Cool lower over extremities Neuro:  Nonfocal. Psych: Alert and oriented x 3. Normal affect.  Labs    Chemistry Recent Labs  Lab 01/10/2022 1958 01/18/2022 2133 01/20/22 0412 01/20/22 0853 2022-02-12 0544  NA 135   < > 126* 123* 135  K 6.8*   < > 4.5 4.7 3.4*  CL 99   < > 94* 88* 98  CO2 17*   < > '25 24 23  '$ GLUCOSE 232*   < > 316* 419* 185*  BUN 81*   < > 80* 77* 71*  CREATININE 2.11*   < > 1.59* 1.61* 1.30*  CALCIUM 10.9*   < > 9.2 9.0 8.7*  PROT 6.0*  --   --   --   --  ALBUMIN 3.7  --   --   --   --   AST 42*  --   --   --   --   ALT 57*  --   --   --   --   ALKPHOS 51  --   --   --   --   BILITOT 0.7  --   --   --   --   GFRNONAA 21*   < > 30* 29* 38*  ANIONGAP 19*   < > '7 11 14   '$ < > = values in this interval not displayed.     Hematology Recent Labs  Lab 01/17/22 0507 01/18/22 0408 01/18/22 0915 01/20/22 0412  WBC 11.3* 11.5*  --  10.7*  RBC 4.68 5.29*  --  4.64  HGB 16.5* 18.9* 17.3* 16.3*  HCT 49.0* 55.7* 51.0* 49.6*  MCV 104.7* 105.3*  --  106.9*  MCH 35.3* 35.7*  --  35.1*  MCHC 33.7 33.9  --  32.9  RDW 14.3 14.1  --  14.2  PLT 200 199  --  181    Cardiac Enzymes Recent Labs  Lab 01/27/2022 1958 02/07/2022 2133  TROPONINIHS 66* 61*    BNP Recent Labs  Lab 02/04/2022 1958 02/03/2022 2133 01/18/22 0408  BNP 3,526.7* 2,830.5* 2,980.0*     DDimerNo results for input(s): "DDIMER" in the last 168 hours.   Radiology    No  results found.  Cardiac Studies   Echo from 01/2022 LVEF 06% RV systolic function is severely reduced, RV size is severely enlarged, moderately elevated pulmonary artery systolic pressures. There is an echodensity on the aortic valve leaflet  Assessment & Plan   Patient is a 88 year old F known to have CAD status post CABG in 2014, severe RV failure, postop A-fib without known recurrence, HTN, DM, hypothyroidism was brought to the hospital for symptomatic bradycardia.  #Acute on chronic right ventricular systolic heart failure exacerbation with preserved left ventricular systolic function -Patient has bilateral rails with 1+ pitting edema in the lower extremities.  She did not tolerate milrinone due to nausea and I do not think she is a candidate for dobutamine due to frequent PVCs after starting the drip.  We will stop dobutamine drip. Continue spot dosing with IV Lasix 20 mg for 2 doses. I do not anticipate any improvement in his clinical status without inotropic support and do not think we can wean her off from the pressors either. I discussed the same with the daughter at the bedside and strongly recommended her to discuss goals of care with the palliative care.  #Echodensity on the aortic valve leaflet -Discuss goals of care with the palliative care  #Symptomatic bradycardia likely secondary to severe hypothyroidism/hyperkalemia/beta-blockers (coreg), resolved  CRITICAL CARE Performed by: Vangie Bicker, MD   Total critical care time: 45 minutes   Critical care time was exclusive of separately billable procedures and treating other patients.   Critical care was necessary to treat or prevent imminent or life-threatening deterioration.   Critical care was time spent personally by me (independent of APPs or residents) on the following activities: development of treatment plan with patient and/or surrogate as well as nursing, discussions with consultants, evaluation of  patient's response to treatment, examination of patient, obtaining history from patient or surrogate, ordering and performing treatments and interventions, ordering and review of laboratory studies, ordering and review of radiographic studies, pulse oximetry and re-evaluation of patient's condition.    Signed, Deja Pisarski P Glendene Wyer,  MD  Feb 09, 2022, 9:12 AM

## 2022-02-09 NOTE — Death Summary Note (Cosign Needed)
Death Summary    Patient ID: Marissa Jefferson MRN: [id number]; DOB: Oct 31, 1924  Admit Date: 02-04-2022 Date of Death: 02-09-22  Primary Care Provider: Merrilee Seashore, MD  Primary Cardiologist: Sanda Klein, MD  Primary Electrophysiologist:  None   Discharge Diagnoses    Principal Problem:   Symptomatic bradycardia Active Problems:   Acute on chronic systolic heart failure [facility])    Diagnostic Studies/Procedures    Echo 01/17/22  1. Left ventricular ejection fraction, by estimation, is 50%. The left  ventricle has mildly decreased function. The left ventricle has no  regional wall motion abnormalities. There is moderate concentric left  ventricular hypertrophy. Left ventricular  diastolic parameters are consistent with Grade I diastolic dysfunction  (impaired relaxation).   2. D-shaped interventricular septum suggesting RV pressure/volume  overload. Right ventricular systolic function is severely reduced. The  right ventricular size is severely enlarged. There is moderately elevated  pulmonary artery systolic pressure. The  estimated right ventricular systolic pressure is 72.5 mmHg.   3. Right atrial size was severely dilated.   4. Tricuspid valve regurgitation is mild to moderate.   5. The aortic valve is bicuspid with fused non and left coronary cusps.  There does appear to be a small mobile vegetation on the aortic valve.  There is moderate calcification of the aortic valve. Aortic valve  regurgitation is not visualized. No doppler  evidence for aortic stenosis was shown on this study but visually there  does appear to be aortic stenosis present. Suggest TEE for better  evaluation of aortic valve.   6. Pulmonic valve regurgitation is moderate.   7. The mitral valve is normal in structure. Trivial mitral valve  regurgitation. No evidence of mitral stenosis.   8. The inferior vena cava is dilated in size with <50% respiratory  variability, suggesting right atrial  pressure of 15 mmHg.   9. There is a left pleural effusion. Moderate pericardial effusion  primarily towards the apex with fibrinous material. No tamponade.  10. Aortic dilatation noted. There is mild dilatation of the ascending  aorta, measuring 41 mm.   _____________   History of Present Illness     88 y.o. female CAD status post CABG in 2014, HFpEF, postoperative atrial fibrillation without known recurrence, hypertension, diabetes, hypothyroidism on therapy presented to the hospital with symptomatic bradycardia responded with atropine.  Hospital Course   Patient was admitted to cardiology service for the management of symptomatic bradycardia. Per collateral information, patient did not take her Synthroid for 2 weeks prior to presentation. In addition her labs also showed hyperkalemia, K 6.5 ish. She was also on Coreg at home. She was administered hyperkalemia cocktail, steroids/Synthroid and dopamine infusion with resolution of symptomatic bradycardia. Dopamine was eventually weaned off and she was maintaining normal sinus rhythm. She was also in acute on chronic right ventricular systolic heart failure exacerbation with preserved left ventricular systolic function for which Lasix spot dosing was administered however she did not tolerate inotropic support (did not tolerate milrinone due to nausea and did not tolerate dobutamine due to frequent ectopy).  She was also on norepinephrine and vasopressin and still hypotensive.  Palliative care was consulted and decision was made to switch to comfort care.  Versed and Dilaudid drips were started and patient passed away on 02-09-2022 at 1513. please see below for the detailed assessment and plan for each problem.   #Acute on chronic right ventricular systolic heart failure exacerbation with preserved left ventricular systolic function -Patient had bilateral rails with 1+ pitting  edema in the lower extremities.  She did not tolerate milrinone due to  nausea and not a candidate for dobutamine due to frequent PVCs after starting the drip. Treated with spot dosing with IV Lasix 20 mg. We did not expect improvement in clinical status without inotropic support and do not think we can wean her off from the pressors either. Palliative care was consult and she was switched to comfort care this AM. She was started on Versed and diladud gtts and passed 11/12 at 1513.   #Echodensity on the aortic valve leaflet: this was not worked up further given comfort care status   #Symptomatic bradycardia: likely secondary to severe hypothyroidism/hyperkalemia/beta-blockers (coreg). This resolved  #Hypothyroidism: noted to have a TSH of 32 and per family report may not have been taking her levothyroxine for approximately 2 weeks by assessment of daily pill container.   Consultants: palliative care.    Time of death: 36 _____________  Labs & Radiologic Studies    CBC Recent Labs    01/20/22 0412  WBC 10.7*  HGB 16.3*  HCT 49.6*  MCV 106.9*  PLT 106   Basic Metabolic Panel Recent Labs    01/20/22 0853 02/13/22 0544  NA 123* 135  K 4.7 3.4*  CL 88* 98  CO2 24 23  GLUCOSE 419* 185*  BUN 77* 71*  CREATININE 1.61* 1.30*  CALCIUM 9.0 8.7*   Liver Function Tests No results for input(s): "AST", "ALT", "ALKPHOS", "BILITOT", "PROT", "ALBUMIN" in the last 72 hours. No results for input(s): "LIPASE", "AMYLASE" in the last 72 hours. High Sensitivity Troponin:   Recent Labs  Lab 01/13/2022 1958 01/10/2022 2133  TROPONINIHS 66* 61*    BNP Invalid input(s): "POCBNP" D-Dimer No results for input(s): "DDIMER" in the last 72 hours. Hemoglobin A1C No results for input(s): "HGBA1C" in the last 72 hours. Fasting Lipid Panel No results for input(s): "CHOL", "HDL", "LDLCALC", "TRIG", "CHOLHDL", "LDLDIRECT" in the last 72 hours. Thyroid Function Tests No results for input(s): "TSH", "T4TOTAL", "T3FREE", "THYROIDAB" in the last 72 hours.  Invalid  input(s): "FREET3" _____________  DG Chest Port 1 View  Result Date: 01/18/2022 CLINICAL DATA:  Hypoxia. EXAM: PORTABLE CHEST 1 VIEW COMPARISON:  01/14/2022 FINDINGS: Previous median sternotomy and CABG procedure. Bilateral pulmonary artery enlargement compatible with PA hypertension. Small bilateral pleural effusions identified. Mild diffuse interstitial edema identified. Unchanged left lower persistent retrocardiac opacification in the left base compatible with atelectasis or airspace disease. New bandlike opacity within the left midlung may reflect fluid along the oblique fissure or discoid atelectasis in the left midlung. Stable cardiomediastinal contours. IMPRESSION: 1. Interstitial edema and pleural effusion concerning for congestive heart failure. Mild persistent left lower lobe atelectasis and or airspace disease. 2. New bandlike opacity within the left midlung may reflect fluid along the oblique fissure or discoid atelectasis in the left midlung. Electronically Signed   By: Kerby Moors M.D.   On: 01/18/2022 10:22   [place] EKG SITE RITE  Result Date: 01/18/2022 If Site Rite image not attached, placement could not be confirmed due to current cardiac rhythm.  ECHOCARDIOGRAM COMPLETE  Result Date: 01/17/2022    ECHOCARDIOGRAM REPORT   Patient Name:   MAIKO SALAIS Date of Exam: 01/17/2022 Medical Rec #:  [id number]  Height:       66.0 in Accession #:    [id number] Weight:       143.3 lb Date of Birth:  06-20-1924  BSA:          1.736 m Patient  Age:    88 years   BP:           104/66 mmHg Patient Gender: F          HR:           61 bpm. Exam Location:  Inpatient Procedure: 2D Echo, Color Doppler, Cardiac Doppler and 3D Echo Indications:    CHF-Acute Diastolic Y78.29  History:        Patient has prior history of Echocardiogram examinations, most                 recent 07/10/2021. Cardiomyopathy and CHF, CAD and NSTEMI, Prior                 CABG, Pulmonary HTN, Arrythmias:Bradycardia, Atrial Fibrillation                  and PVC; Risk Factors:Hypertension, Dyslipidemia, Diabetes and                 Former Smoker.  Sonographer:    Greer Pickerel Referring Phys: [place] Comments: Image acquisition challenging due to respiratory motion, Image acquisition challenging due to patient body habitus and Image acquisition challenging due to COPD. IMPRESSIONS  1. Left ventricular ejection fraction, by estimation, is 50%. The left ventricle has mildly decreased function. The left ventricle has no regional wall motion abnormalities. There is moderate concentric left ventricular hypertrophy. Left ventricular diastolic parameters are consistent with Grade I diastolic dysfunction (impaired relaxation).  2. D-shaped interventricular septum suggesting RV pressure/volume overload. Right ventricular systolic function is severely reduced. The right ventricular size is severely enlarged. There is moderately elevated pulmonary artery systolic pressure. The estimated right ventricular systolic pressure is 56.2 mmHg.  3. Right atrial size was severely dilated.  4. Tricuspid valve regurgitation is mild to moderate.  5. The aortic valve is bicuspid with fused non and left coronary cusps. There does appear to be a small mobile vegetation on the aortic valve. There is moderate calcification of the aortic valve. Aortic valve regurgitation is not visualized. No doppler evidence for aortic stenosis was shown on this study but visually there does appear to be aortic stenosis present. Suggest TEE for better evaluation of aortic valve.  6. Pulmonic valve regurgitation is moderate.  7. The mitral valve is normal in structure. Trivial mitral valve regurgitation. No evidence of mitral stenosis.  8. The inferior vena cava is dilated in size with <50% respiratory variability, suggesting right atrial pressure of 15 mmHg.  9. There is a left pleural effusion. Moderate pericardial effusion primarily towards the apex with fibrinous material.  No tamponade. 10. Aortic dilatation noted. There is mild dilatation of the ascending aorta, measuring 41 mm. FINDINGS  Left Ventricle: Left ventricular ejection fraction, by estimation, is 50%. The left ventricle has mildly decreased function. The left ventricle has no regional wall motion abnormalities. The left ventricular internal cavity size was small. There is moderate concentric left ventricular hypertrophy. Left ventricular diastolic parameters are consistent with Grade I diastolic dysfunction (impaired relaxation). Right Ventricle: D-shaped interventricular septum suggesting RV pressure/volume overload. The right ventricular size is severely enlarged. No increase in right ventricular wall thickness. Right ventricular systolic function is severely reduced. There is moderately elevated pulmonary artery systolic pressure. The tricuspid regurgitant velocity is 3.30 m/s, and with an assumed right atrial pressure of 15 mmHg, the estimated right ventricular systolic pressure is 13.0 mmHg. Left Atrium: Left atrial size was normal in size. Right Atrium: Right atrial  size was severely dilated. Pericardium: There is a left pleural effusion. A moderately sized pericardial effusion is present. Mitral Valve: The mitral valve is normal in structure. Mild to moderate mitral annular calcification. Trivial mitral valve regurgitation. No evidence of mitral valve stenosis. Tricuspid Valve: The tricuspid valve is normal in structure. Tricuspid valve regurgitation is mild to moderate. Aortic Valve: Small mobile vegetation on aortic valve. The aortic valve is bicuspid. There is severe calcifcation of the aortic valve. Aortic valve regurgitation is not visualized. No aortic stenosis is present. Pulmonic Valve: The pulmonic valve was normal in structure. Pulmonic valve regurgitation is moderate. Aorta: The aortic root is normal in size and structure and aortic dilatation noted. There is mild dilatation of the ascending aorta,  measuring 41 mm. Venous: The inferior vena cava is dilated in size with less than 50% respiratory variability, suggesting right atrial pressure of 15 mmHg. IAS/Shunts: No atrial level shunt detected by color flow Doppler.  LEFT VENTRICLE PLAX 2D LVIDd:         3.40 cm   Diastology LVIDs:         2.30 cm   LV e' medial:    3.49 cm/s LV PW:         1.30 cm   LV E/e' medial:  15.3 LV IVS:        1.30 cm   LV e' lateral:   3.97 cm/s LVOT diam:     1.80 cm   LV E/e' lateral: 13.5 LV SV:         28 LV SV Index:   16 LVOT Area:     2.54 cm                           3D Volume EF:                          3D EF:        47 %                          LV EDV:       63 ml                          LV ESV:       33 ml                          LV SV:        30 ml RIGHT VENTRICLE RV S prime:     6.89 cm/s TAPSE (M-mode): 0.8 cm LEFT ATRIUM             Index        RIGHT ATRIUM           Index LA diam:        2.90 cm 1.67 cm/m   RA Area:     23.60 cm LA Vol (A2C):   47.2 ml 27.19 ml/m  RA Volume:   88.60 ml  51.05 ml/m LA Vol (A4C):   28.6 ml 16.48 ml/m LA Biplane Vol: 37.0 ml 21.32 ml/m  AORTIC VALVE             PULMONIC VALVE LVOT Vmax:   57.50 cm/s  PR End Diast Vel: 10.50 msec LVOT Vmean:  36.700 cm/s LVOT VTI:  0.110 m  AORTA Ao Root diam: 3.30 cm Ao Asc diam:  4.10 cm MITRAL VALVE               TRICUSPID VALVE MV Area (PHT): 2.01 cm    TR Peak grad:   43.6 mmHg MV Decel Time: 377 msec    TR Vmax:        330.00 cm/s MV E velocity: 53.40 cm/s MV A velocity: 60.10 cm/s  SHUNTS MV E/A ratio:  0.89        Systemic VTI:  0.11 m                            Systemic Diam: 1.80 cm Dalton McleanMD Electronically signed by Franki Monte Signature Date/Time: 01/17/2022/4:21:50 PM    Final    DG Chest Portable 1 View  Result Date: January 23, 2022 CLINICAL DATA:  Bradycardia EXAM: PORTABLE CHEST 1 VIEW COMPARISON:  Chest 07/11/2021 FINDINGS: Postop CABG. Mild vascular congestion. Small bilateral pleural effusions. Possible mild  interstitial edema Left lower lobe airspace disease most compatible with atelectasis. IMPRESSION: Mild vascular congestion possible mild fluid overload Bilateral pleural effusions and left lower lobe atelectasis. Electronically Signed   By: Franchot Gallo M.D.   On: 2022/01/23 19:51    Duration of Death Summary Encounter   Greater than 30 minutes including physician time.  Signed, Thaddius Manes Fidel Levy, MD Cardiology

## 2022-02-09 NOTE — Progress Notes (Signed)
   Palliative Medicine Inpatient Follow Up Note HPI: 88 year old female with PMH as below, which is significant for HFrEF (LVEF 20% in 2015 now recovered post CABG x 4 to 55%), DM, HTN, Hypothyroid, and now has diastolic CHF. Palliative care has been asked to get involved to discuss goals of care in the setting of end stage heart failure.    Today's Discussion 02/16/22  *Please note that this is a verbal dictation therefore any spelling or grammatical errors are due to the "[place]" system interpretation.  Chart reviewed inclusive of vital signs, progress notes, laboratory results, and diagnostic images.   I met with patients daughter, [name] at bedside this morning. She shares with me that her mother had a fairly decent day yesterday and visited with her oldest friend. She expresses that the 0.70m of dilaudid was quite [facility] We talked about a plan to decrease the dilaudid dose and frequency PRN as well as incorporating BID tylenol [facility]. Discussed adding a rescue laxative for constipation as well.   Per AAudrea Muscathad shared yesterday with her friend that she was not sure this would be her last time seeing her and made many statements indicating the hope to get back home. [name] vocalizes that at this point she wants to continue present efforts to stabilize MShirley She recognizes that [facility] will guide as as to what is and is not possible.   Created space and opportunity for ALelon Frohlichto explore thoughts feelings and fears regarding current medical situation.  Questions and concerns addressed/Palliative Support Provided.   Objective Assessment: Vital Signs Vitals:   [id number]  BP: 90/73 (!) 80/49  Pulse: (!) 55 (!) 48  Resp: 12 (!) 9  Temp:    SpO2: 96% 92%    Intake/Output Summary (Last 24 hours) at 112/08/2301937Last data filed at 12023/12/080600 Gross per 24 hour  Intake 948.67 ml  Output 880 ml  Net 68.67 ml   Last  Weight  Most recent update: 12023-12-08 5:32 AM    Weight  65.9 kg (145 lb 4.5 oz)            Gen:  Frail elderly caucasian F in NAD HEENT: moist mucous membranes CV: Regular rate and rhythm  PULM:  On 10LPM HFNC ABD: soft/nontender  EXT: No edema  Neuro: Sleeping  SUMMARY OF RECOMMENDATIONS   DNAR/DNI   Continue present measures in an effort to optimize patient - at this point not ready for comfort focused care    Ongoing support   Code Status/Advance Care Planning: DNAR/DNI   Symptom Management:  Nausea: - Change zofram to ATC - Continue reglan PRN  Pain: - Dilaudid 0.244mIVP Q6H PRN - Added tylenol 50061mO BID - Continue PRN tylenol no more than 3g in 24 hrs  Constipation: - Continue miralaz 17g PO Daily PRN - Added bisacodyl 68m30m suppository  Billing based on MDM: High ______________________________________________________________________________________ [place] Team Cell Phone: [phone] utilize secure chat with additional questions, if there is no response within 30 minutes please call the above phone number  Palliative Medicine Team providers are available by phone from 7am to 7pm daily and can be reached through the team cell phone.  Should this patient require assistance outside of these hours, please call the patient's attending physician.

## 2022-02-09 NOTE — Progress Notes (Signed)
   Palliative Medicine Inpatient Follow Up Note  I met at bedside this afternoon with Marissa Jefferson, her brother, and his spouse. We discussed that since meeting this morning Marissa Jefferson has had more arrhythmias and overall been increasing more lethargic and uncomfortable in appearance. Marissa Jefferson shares that she has reached a turing point. [name] vocalizes the desire to keep [facility] comfortable and allow her to pass on peacefully.   We discussed in brief what comfort care entails. Reviewed the idea of starting both dilaudid and versed to support pain, agitation, anxiety, dyspnea.Discussed after comfort medications begin weaning her off of pressor support.  Patients family is agreeable to this.   Support provided.   Updated nursing staff, cardiology, and critical care.  Orders entered.   SUMMARY OF RECOMMENDATIONS   DNAR/DNI  Comfort Care  Versed and Dialudid gtts to be initiated  Wean off pressors  Anticipate inpatient passing  Ongoing Palliative support  Additional Time: 30 minutes ______________________________________________________________________________________ [place] Team Team Cell Phone: [phone] Please utilize secure chat with additional questions, if there is no response within 30 minutes please call the above phone number  Palliative Medicine Team providers are available by phone from 7am to 7pm daily and can be reached through the team cell phone.  Should this patient require assistance outside of these hours, please call the patient's attending physician.

## 2022-02-09 NOTE — Progress Notes (Signed)
INsulin transition orders written , previously on orals  Overlap for 2 hrs then turn off insulin gtt  Aubrielle Stroud V. Elsworth Soho MD

## 2022-02-09 DEATH — deceased
